# Patient Record
Sex: Male | Born: 1955 | Race: White | Hispanic: No | Smoking: Current every day smoker
Health system: Southern US, Community
[De-identification: ages and names within clinical notes are randomized; demographics above are authoritative.]

## PROBLEM LIST (undated history)

## (undated) DIAGNOSIS — J42 Unspecified chronic bronchitis: Secondary | ICD-10-CM

## (undated) DIAGNOSIS — B1920 Unspecified viral hepatitis C without hepatic coma: Secondary | ICD-10-CM

## (undated) DIAGNOSIS — E119 Type 2 diabetes mellitus without complications: Secondary | ICD-10-CM

## (undated) DIAGNOSIS — F32A Depression, unspecified: Secondary | ICD-10-CM

## (undated) DIAGNOSIS — F329 Major depressive disorder, single episode, unspecified: Secondary | ICD-10-CM

## (undated) DIAGNOSIS — F1020 Alcohol dependence, uncomplicated: Secondary | ICD-10-CM

## (undated) DIAGNOSIS — K7030 Alcoholic cirrhosis of liver without ascites: Secondary | ICD-10-CM

## (undated) DIAGNOSIS — IMO0001 Reserved for inherently not codable concepts without codable children: Secondary | ICD-10-CM

## (undated) DIAGNOSIS — C801 Malignant (primary) neoplasm, unspecified: Secondary | ICD-10-CM

## (undated) DIAGNOSIS — Z720 Tobacco use: Secondary | ICD-10-CM

## (undated) DIAGNOSIS — D696 Thrombocytopenia, unspecified: Secondary | ICD-10-CM

## (undated) DIAGNOSIS — R739 Hyperglycemia, unspecified: Secondary | ICD-10-CM

## (undated) DIAGNOSIS — F419 Anxiety disorder, unspecified: Secondary | ICD-10-CM

## (undated) DIAGNOSIS — F1910 Other psychoactive substance abuse, uncomplicated: Secondary | ICD-10-CM

## (undated) DIAGNOSIS — E871 Hypo-osmolality and hyponatremia: Secondary | ICD-10-CM

## (undated) DIAGNOSIS — G629 Polyneuropathy, unspecified: Secondary | ICD-10-CM

## (undated) HISTORY — DX: Unspecified chronic bronchitis: J42

## (undated) HISTORY — DX: Other psychoactive substance abuse, uncomplicated: F19.10

## (undated) HISTORY — PX: FRACTURE SURGERY: SHX138

---

## 1998-12-05 ENCOUNTER — Emergency Department (HOSPITAL_COMMUNITY): Admission: EM | Admit: 1998-12-05 | Discharge: 1998-12-05 | Payer: Self-pay | Admitting: Emergency Medicine

## 2001-01-15 ENCOUNTER — Emergency Department (HOSPITAL_COMMUNITY): Admission: EM | Admit: 2001-01-15 | Discharge: 2001-01-15 | Payer: Self-pay | Admitting: Emergency Medicine

## 2005-09-03 ENCOUNTER — Emergency Department (HOSPITAL_COMMUNITY): Admission: EM | Admit: 2005-09-03 | Discharge: 2005-09-03 | Payer: Self-pay | Admitting: Emergency Medicine

## 2005-09-06 ENCOUNTER — Emergency Department (HOSPITAL_COMMUNITY): Admission: EM | Admit: 2005-09-06 | Discharge: 2005-09-06 | Payer: Self-pay | Admitting: Emergency Medicine

## 2005-09-24 ENCOUNTER — Emergency Department (HOSPITAL_COMMUNITY): Admission: EM | Admit: 2005-09-24 | Discharge: 2005-09-25 | Payer: Self-pay | Admitting: Emergency Medicine

## 2005-12-25 ENCOUNTER — Emergency Department (HOSPITAL_COMMUNITY): Admission: EM | Admit: 2005-12-25 | Discharge: 2005-12-26 | Payer: Self-pay | Admitting: Emergency Medicine

## 2006-05-10 HISTORY — PX: OTHER SURGICAL HISTORY: SHX169

## 2007-06-22 ENCOUNTER — Emergency Department (HOSPITAL_COMMUNITY): Admission: EM | Admit: 2007-06-22 | Discharge: 2007-06-22 | Payer: Self-pay | Admitting: Emergency Medicine

## 2007-07-11 ENCOUNTER — Emergency Department (HOSPITAL_COMMUNITY): Admission: EM | Admit: 2007-07-11 | Discharge: 2007-07-12 | Payer: Self-pay | Admitting: Emergency Medicine

## 2007-07-13 ENCOUNTER — Emergency Department: Payer: Self-pay | Admitting: Emergency Medicine

## 2007-07-17 ENCOUNTER — Emergency Department (HOSPITAL_COMMUNITY): Admission: EM | Admit: 2007-07-17 | Discharge: 2007-07-17 | Payer: Self-pay | Admitting: Emergency Medicine

## 2007-08-07 ENCOUNTER — Emergency Department (HOSPITAL_COMMUNITY): Admission: EM | Admit: 2007-08-07 | Discharge: 2007-08-07 | Payer: Self-pay | Admitting: Emergency Medicine

## 2007-08-08 ENCOUNTER — Emergency Department (HOSPITAL_COMMUNITY): Admission: EM | Admit: 2007-08-08 | Discharge: 2007-08-08 | Payer: Self-pay | Admitting: Emergency Medicine

## 2007-08-08 ENCOUNTER — Inpatient Hospital Stay (HOSPITAL_COMMUNITY): Admission: RE | Admit: 2007-08-08 | Discharge: 2007-08-14 | Payer: Self-pay | Admitting: Psychiatry

## 2007-08-08 ENCOUNTER — Ambulatory Visit: Payer: Self-pay | Admitting: Psychiatry

## 2007-08-10 ENCOUNTER — Ambulatory Visit (HOSPITAL_COMMUNITY): Admission: RE | Admit: 2007-08-10 | Discharge: 2007-08-10 | Payer: Self-pay | Admitting: Psychiatry

## 2007-09-10 ENCOUNTER — Other Ambulatory Visit: Payer: Self-pay | Admitting: Emergency Medicine

## 2007-09-11 ENCOUNTER — Other Ambulatory Visit: Payer: Self-pay | Admitting: Emergency Medicine

## 2007-09-11 ENCOUNTER — Inpatient Hospital Stay (HOSPITAL_COMMUNITY): Admission: AD | Admit: 2007-09-11 | Discharge: 2007-09-13 | Payer: Self-pay | Admitting: Internal Medicine

## 2007-09-12 ENCOUNTER — Ambulatory Visit: Payer: Self-pay | Admitting: Psychiatry

## 2007-09-15 ENCOUNTER — Emergency Department (HOSPITAL_COMMUNITY): Admission: EM | Admit: 2007-09-15 | Discharge: 2007-09-16 | Payer: Self-pay | Admitting: Emergency Medicine

## 2007-09-22 ENCOUNTER — Emergency Department (HOSPITAL_COMMUNITY): Admission: EM | Admit: 2007-09-22 | Discharge: 2007-09-22 | Payer: Self-pay | Admitting: Emergency Medicine

## 2007-09-23 ENCOUNTER — Ambulatory Visit: Payer: Self-pay | Admitting: *Deleted

## 2007-09-23 ENCOUNTER — Inpatient Hospital Stay (HOSPITAL_COMMUNITY): Admission: EM | Admit: 2007-09-23 | Discharge: 2007-09-27 | Payer: Self-pay | Admitting: *Deleted

## 2007-11-02 ENCOUNTER — Emergency Department (HOSPITAL_COMMUNITY): Admission: EM | Admit: 2007-11-02 | Discharge: 2007-11-03 | Payer: Self-pay | Admitting: Emergency Medicine

## 2007-11-06 ENCOUNTER — Emergency Department (HOSPITAL_COMMUNITY): Admission: EM | Admit: 2007-11-06 | Discharge: 2007-11-07 | Payer: Self-pay | Admitting: Emergency Medicine

## 2008-03-20 ENCOUNTER — Emergency Department (HOSPITAL_COMMUNITY): Admission: EM | Admit: 2008-03-20 | Discharge: 2008-03-21 | Payer: Self-pay | Admitting: Emergency Medicine

## 2008-03-21 ENCOUNTER — Emergency Department: Payer: Self-pay | Admitting: Emergency Medicine

## 2008-03-23 ENCOUNTER — Emergency Department: Payer: Self-pay | Admitting: Emergency Medicine

## 2008-03-27 ENCOUNTER — Emergency Department (HOSPITAL_COMMUNITY): Admission: EM | Admit: 2008-03-27 | Discharge: 2008-03-28 | Payer: Self-pay | Admitting: Emergency Medicine

## 2008-03-28 ENCOUNTER — Emergency Department (HOSPITAL_COMMUNITY): Admission: EM | Admit: 2008-03-28 | Discharge: 2008-03-28 | Payer: Self-pay | Admitting: *Deleted

## 2008-03-29 ENCOUNTER — Emergency Department (HOSPITAL_COMMUNITY): Admission: EM | Admit: 2008-03-29 | Discharge: 2008-03-30 | Payer: Self-pay | Admitting: Emergency Medicine

## 2008-03-31 ENCOUNTER — Emergency Department (HOSPITAL_COMMUNITY): Admission: EM | Admit: 2008-03-31 | Discharge: 2008-03-31 | Payer: Self-pay | Admitting: Emergency Medicine

## 2008-04-01 ENCOUNTER — Emergency Department (HOSPITAL_COMMUNITY): Admission: EM | Admit: 2008-04-01 | Discharge: 2008-04-02 | Payer: Self-pay | Admitting: Emergency Medicine

## 2008-11-24 ENCOUNTER — Emergency Department (HOSPITAL_COMMUNITY): Admission: EM | Admit: 2008-11-24 | Discharge: 2008-11-25 | Payer: Self-pay | Admitting: Emergency Medicine

## 2008-11-25 ENCOUNTER — Emergency Department (HOSPITAL_COMMUNITY): Admission: EM | Admit: 2008-11-25 | Discharge: 2008-11-26 | Payer: Self-pay | Admitting: Emergency Medicine

## 2008-11-26 ENCOUNTER — Inpatient Hospital Stay (HOSPITAL_COMMUNITY): Admission: EM | Admit: 2008-11-26 | Discharge: 2008-12-02 | Payer: Self-pay | Admitting: Psychiatry

## 2008-11-26 ENCOUNTER — Ambulatory Visit: Payer: Self-pay | Admitting: Psychiatry

## 2009-04-27 ENCOUNTER — Emergency Department (HOSPITAL_COMMUNITY): Admission: EM | Admit: 2009-04-27 | Discharge: 2009-04-28 | Payer: Self-pay | Admitting: Emergency Medicine

## 2009-05-04 ENCOUNTER — Emergency Department (HOSPITAL_COMMUNITY): Admission: EM | Admit: 2009-05-04 | Discharge: 2009-05-04 | Payer: Self-pay | Admitting: Emergency Medicine

## 2009-05-17 ENCOUNTER — Emergency Department (HOSPITAL_COMMUNITY): Admission: EM | Admit: 2009-05-17 | Discharge: 2009-05-18 | Payer: Self-pay | Admitting: Emergency Medicine

## 2010-04-12 ENCOUNTER — Emergency Department (HOSPITAL_COMMUNITY)
Admission: EM | Admit: 2010-04-12 | Discharge: 2010-04-12 | Disposition: A | Discharge status: Other Acute Inpatient Hospital | Payer: Self-pay | Source: Home / Self Care | Admitting: Emergency Medicine

## 2010-07-18 ENCOUNTER — Emergency Department (HOSPITAL_COMMUNITY)
Admission: EM | Admit: 2010-07-18 | Discharge: 2010-07-19 | Disposition: A | Discharge status: Home / Self Care | Payer: Self-pay | Attending: Emergency Medicine | Admitting: Emergency Medicine

## 2010-07-18 ENCOUNTER — Emergency Department (HOSPITAL_COMMUNITY): Payer: Self-pay

## 2010-07-18 DIAGNOSIS — E119 Type 2 diabetes mellitus without complications: Secondary | ICD-10-CM | POA: Insufficient documentation

## 2010-07-18 DIAGNOSIS — F1010 Alcohol abuse, uncomplicated: Secondary | ICD-10-CM | POA: Insufficient documentation

## 2010-07-18 DIAGNOSIS — Z8619 Personal history of other infectious and parasitic diseases: Secondary | ICD-10-CM | POA: Insufficient documentation

## 2010-07-18 LAB — DIFFERENTIAL
Basophils Absolute: 0.1 10*3/uL (ref 0.0–0.1)
Basophils Relative: 1 % (ref 0–1)
Eosinophils Absolute: 0.1 K/uL (ref 0.0–0.7)
Eosinophils Relative: 1 % (ref 0–5)
Lymphocytes Relative: 40 % (ref 12–46)
Lymphs Abs: 2.4 10*3/uL (ref 0.7–4.0)
Monocytes Absolute: 0.6 K/uL (ref 0.1–1.0)
Monocytes Relative: 10 % (ref 3–12)
Neutro Abs: 2.9 K/uL (ref 1.7–7.7)
Neutrophils Relative %: 48 % (ref 43–77)

## 2010-07-18 LAB — COMPREHENSIVE METABOLIC PANEL WITH GFR
Albumin: 4 g/dL (ref 3.5–5.2)
CO2: 30 meq/L (ref 19–32)
Chloride: 90 meq/L — ABNORMAL LOW (ref 96–112)
Creatinine, Ser: 0.69 mg/dL (ref 0.4–1.5)
GFR calc Af Amer: >60 mL/min (ref >60)
Potassium: 3.9 meq/L (ref 3.5–5.1)
Sodium: 130 meq/L — ABNORMAL LOW (ref 135–145)
Total Bilirubin: 0.7 mg/dL (ref 0.3–1.2)
Total Protein: 8.4 g/dL — ABNORMAL HIGH (ref 6.0–8.3)

## 2010-07-18 LAB — CBC
HCT: 43.8 % (ref 39.0–52.0)
Hemoglobin: 15.7 g/dL (ref 13.0–17.0)
MCH: 36.6 pg — ABNORMAL HIGH (ref 26.0–34.0)
MCHC: 35.8 g/dL (ref 30.0–36.0)
MCV: 102.1 fL — ABNORMAL HIGH (ref 78.0–100.0)
Platelets: 70 10*3/uL — ABNORMAL LOW (ref 150–400)
RBC: 4.29 MIL/uL (ref 4.22–5.81)
RDW: 13.3 % (ref 11.5–15.5)
WBC: 5.9 10*3/uL (ref 4.0–10.5)

## 2010-07-18 LAB — RAPID URINE DRUG SCREEN, HOSP PERFORMED
Amphetamines: NOT DETECTED
Barbiturates: NOT DETECTED
Benzodiazepines: NOT DETECTED
Cocaine: NOT DETECTED
Opiates: NOT DETECTED
Tetrahydrocannabinol: NOT DETECTED

## 2010-07-18 LAB — COMPREHENSIVE METABOLIC PANEL
ALT: 68 U/L — ABNORMAL HIGH (ref 0–53)
AST: 79 U/L — ABNORMAL HIGH (ref 0–37)
Alkaline Phosphatase: 304 U/L — ABNORMAL HIGH (ref 39–117)
BUN: 7 mg/dL (ref 6–23)
Calcium: 9.2 mg/dL (ref 8.4–10.5)
GFR calc non Af Amer: >60 mL/min (ref >60)
Glucose, Bld: 521 mg/dL — ABNORMAL HIGH (ref 70–99)

## 2010-07-18 LAB — ETHANOL: Alcohol, Ethyl (B): 326 mg/dL — ABNORMAL HIGH (ref 0–10)

## 2010-07-19 LAB — GLUCOSE, CAPILLARY
Glucose-Capillary: 311 mg/dL — ABNORMAL HIGH (ref 70–99)
Glucose-Capillary: 272 mg/dL — ABNORMAL HIGH (ref 70–99)

## 2010-07-20 LAB — URINE MICROSCOPIC-ADD ON

## 2010-07-20 LAB — GLUCOSE, CAPILLARY
Glucose-Capillary: 178 mg/dL — ABNORMAL HIGH (ref 70–99)
Glucose-Capillary: 340 mg/dL — ABNORMAL HIGH (ref 70–99)
Glucose-Capillary: 366 mg/dL — ABNORMAL HIGH (ref 70–99)

## 2010-07-20 LAB — DIFFERENTIAL
Basophils Relative: 0 % (ref 0–1)
Eosinophils Absolute: 0.1 10*3/uL (ref 0.0–0.7)
Neutro Abs: 1.8 10*3/uL (ref 1.7–7.7)
Neutrophils Relative %: 38 % — ABNORMAL LOW (ref 43–77)

## 2010-07-20 LAB — URINALYSIS, ROUTINE W REFLEX MICROSCOPIC
Ketones, ur: NEGATIVE mg/dL
Leukocytes, UA: NEGATIVE
Nitrite: NEGATIVE
Specific Gravity, Urine: 1.026 (ref 1.005–1.030)
Urobilinogen, UA: 1 mg/dL (ref 0.0–1.0)
pH: 6 (ref 5.0–8.0)

## 2010-07-20 LAB — COMPREHENSIVE METABOLIC PANEL
Albumin: 3.8 g/dL (ref 3.5–5.2)
Calcium: 8.8 mg/dL (ref 8.4–10.5)
Chloride: 95 mEq/L — ABNORMAL LOW (ref 96–112)
Creatinine, Ser: 0.62 mg/dL (ref 0.4–1.5)
GFR calc non Af Amer: >60 mL/min (ref >60)
Potassium: 3.8 mEq/L (ref 3.5–5.1)
Sodium: 131 mEq/L — ABNORMAL LOW (ref 135–145)
Total Protein: 7.6 g/dL (ref 6.0–8.3)

## 2010-07-20 LAB — CBC
MCHC: 36.9 g/dL — ABNORMAL HIGH (ref 30.0–36.0)
MCV: 97.9 fL (ref 78.0–100.0)
WBC: 4.8 10*3/uL (ref 4.0–10.5)

## 2010-07-20 LAB — RAPID URINE DRUG SCREEN, HOSP PERFORMED: Cocaine: NOT DETECTED

## 2010-07-20 LAB — LIPASE, BLOOD: Lipase: 48 U/L (ref 11–59)

## 2010-07-25 LAB — DIFFERENTIAL
Basophils Absolute: 0 10*3/uL (ref 0.0–0.1)
Lymphocytes Relative: 55 % — ABNORMAL HIGH (ref 12–46)
Monocytes Absolute: 0.7 10*3/uL (ref 0.1–1.0)
Monocytes Relative: 14 % — ABNORMAL HIGH (ref 3–12)
Neutro Abs: 1.5 10*3/uL — ABNORMAL LOW (ref 1.7–7.7)

## 2010-07-25 LAB — BASIC METABOLIC PANEL
Calcium: 9.1 mg/dL (ref 8.4–10.5)
GFR calc Af Amer: >60 mL/min (ref >60)
GFR calc non Af Amer: >60 mL/min (ref >60)
Sodium: 132 mEq/L — ABNORMAL LOW (ref 135–145)

## 2010-07-25 LAB — GLUCOSE, CAPILLARY
Glucose-Capillary: 240 mg/dL — ABNORMAL HIGH (ref 70–99)
Glucose-Capillary: 580 mg/dL (ref 70–99)

## 2010-07-25 LAB — CBC
Hemoglobin: 14.5 g/dL (ref 13.0–17.0)
RBC: 3.98 MIL/uL — ABNORMAL LOW (ref 4.22–5.81)
RDW: 14.6 % (ref 11.5–15.5)
WBC: 5 10*3/uL (ref 4.0–10.5)

## 2010-07-25 LAB — ETHANOL: Alcohol, Ethyl (B): 277 mg/dL — ABNORMAL HIGH (ref 0–10)

## 2010-07-25 LAB — RAPID URINE DRUG SCREEN, HOSP PERFORMED
Benzodiazepines: NOT DETECTED
Cocaine: NOT DETECTED

## 2010-08-16 LAB — GLUCOSE, CAPILLARY
Glucose-Capillary: 192 mg/dL — ABNORMAL HIGH (ref 70–99)
Glucose-Capillary: 198 mg/dL — ABNORMAL HIGH (ref 70–99)
Glucose-Capillary: 204 mg/dL — ABNORMAL HIGH (ref 70–99)
Glucose-Capillary: 206 mg/dL — ABNORMAL HIGH (ref 70–99)
Glucose-Capillary: 219 mg/dL — ABNORMAL HIGH (ref 70–99)
Glucose-Capillary: 223 mg/dL — ABNORMAL HIGH (ref 70–99)
Glucose-Capillary: 242 mg/dL — ABNORMAL HIGH (ref 70–99)
Glucose-Capillary: 247 mg/dL — ABNORMAL HIGH (ref 70–99)
Glucose-Capillary: 292 mg/dL — ABNORMAL HIGH (ref 70–99)
Glucose-Capillary: 300 mg/dL — ABNORMAL HIGH (ref 70–99)

## 2010-08-16 LAB — RAPID URINE DRUG SCREEN, HOSP PERFORMED
Benzodiazepines: NOT DETECTED
Tetrahydrocannabinol: NOT DETECTED

## 2010-08-16 LAB — COMPREHENSIVE METABOLIC PANEL
ALT: 26 U/L (ref 0–53)
AST: 33 U/L (ref 0–37)
Albumin: 2.8 g/dL — ABNORMAL LOW (ref 3.5–5.2)
BUN: 13 mg/dL (ref 6–23)
CO2: 24 mEq/L (ref 19–32)
CO2: 32 mEq/L (ref 19–32)
Calcium: 10 mg/dL (ref 8.4–10.5)
Calcium: 8.6 mg/dL (ref 8.4–10.5)
Creatinine, Ser: 0.57 mg/dL (ref 0.4–1.5)
Creatinine, Ser: 0.64 mg/dL (ref 0.4–1.5)
GFR calc Af Amer: >60 mL/min (ref >60)
GFR calc non Af Amer: >60 mL/min (ref >60)
GFR calc non Af Amer: >60 mL/min (ref >60)
Glucose, Bld: 376 mg/dL — ABNORMAL HIGH (ref 70–99)
Sodium: 133 mEq/L — ABNORMAL LOW (ref 135–145)

## 2010-08-16 LAB — URINALYSIS, ROUTINE W REFLEX MICROSCOPIC
Ketones, ur: NEGATIVE mg/dL
Leukocytes, UA: NEGATIVE
Nitrite: NEGATIVE
Specific Gravity, Urine: 1.015 (ref 1.005–1.030)
Urobilinogen, UA: 1 mg/dL (ref 0.0–1.0)
pH: 6.5 (ref 5.0–8.0)

## 2010-08-16 LAB — TSH: TSH: 1.812 u[IU]/mL (ref 0.350–4.500)

## 2010-08-16 LAB — CBC
Hemoglobin: 12.4 g/dL — ABNORMAL LOW (ref 13.0–17.0)
MCHC: 34.7 g/dL (ref 30.0–36.0)
MCV: 107.1 fL — ABNORMAL HIGH (ref 78.0–100.0)
RBC: 3.34 MIL/uL — ABNORMAL LOW (ref 4.22–5.81)

## 2010-08-16 LAB — DIFFERENTIAL
Eosinophils Relative: 1 % (ref 0–5)
Monocytes Relative: 17 % — ABNORMAL HIGH (ref 3–12)
Neutrophils Relative %: 55 % (ref 43–77)

## 2010-08-16 LAB — URINE MICROSCOPIC-ADD ON

## 2010-08-16 LAB — RPR: RPR Ser Ql: NONREACTIVE

## 2010-08-26 ENCOUNTER — Inpatient Hospital Stay (INDEPENDENT_AMBULATORY_CARE_PROVIDER_SITE_OTHER)
Admission: RE | Admit: 2010-08-26 | Discharge: 2010-08-26 | Disposition: A | Discharge status: Home / Self Care | Payer: Self-pay | Source: Ambulatory Visit

## 2010-08-26 DIAGNOSIS — R6889 Other general symptoms and signs: Secondary | ICD-10-CM

## 2010-09-10 ENCOUNTER — Emergency Department (HOSPITAL_COMMUNITY)
Admission: EM | Admit: 2010-09-10 | Discharge: 2010-09-11 | Disposition: A | Discharge status: Home / Self Care | Payer: Self-pay | Attending: Emergency Medicine | Admitting: Emergency Medicine

## 2010-09-10 DIAGNOSIS — Z046 Encounter for general psychiatric examination, requested by authority: Secondary | ICD-10-CM | POA: Insufficient documentation

## 2010-09-10 DIAGNOSIS — F1010 Alcohol abuse, uncomplicated: Secondary | ICD-10-CM | POA: Insufficient documentation

## 2010-09-10 DIAGNOSIS — E119 Type 2 diabetes mellitus without complications: Secondary | ICD-10-CM | POA: Insufficient documentation

## 2010-09-10 DIAGNOSIS — Z8619 Personal history of other infectious and parasitic diseases: Secondary | ICD-10-CM | POA: Insufficient documentation

## 2010-09-10 LAB — URINALYSIS, ROUTINE W REFLEX MICROSCOPIC
Ketones, ur: NEGATIVE mg/dL
Leukocytes, UA: NEGATIVE
Nitrite: NEGATIVE
Urobilinogen, UA: 1 mg/dL (ref 0.0–1.0)
pH: 6.5 (ref 5.0–8.0)

## 2010-09-10 LAB — RAPID URINE DRUG SCREEN, HOSP PERFORMED
Amphetamines: NOT DETECTED
Opiates: NOT DETECTED
Tetrahydrocannabinol: NOT DETECTED

## 2010-09-10 LAB — COMPREHENSIVE METABOLIC PANEL
ALT: 33 U/L (ref 0–53)
AST: 61 U/L — ABNORMAL HIGH (ref 0–37)
Albumin: 3.4 g/dL — ABNORMAL LOW (ref 3.5–5.2)
Alkaline Phosphatase: 196 U/L — ABNORMAL HIGH (ref 39–117)
BUN: 7 mg/dL (ref 6–23)
Chloride: 94 mEq/L — ABNORMAL LOW (ref 96–112)
GFR calc Af Amer: >60 mL/min (ref >60)
Potassium: 4.2 mEq/L (ref 3.5–5.1)
Sodium: 130 mEq/L — ABNORMAL LOW (ref 135–145)
Total Bilirubin: 0.9 mg/dL (ref 0.3–1.2)
Total Protein: 8 g/dL (ref 6.0–8.3)

## 2010-09-10 LAB — CBC
MCH: 37.3 pg — ABNORMAL HIGH (ref 26.0–34.0)
Platelets: 76 10*3/uL — ABNORMAL LOW (ref 150–400)
RBC: 3.97 MIL/uL — ABNORMAL LOW (ref 4.22–5.81)
RDW: 14.9 % (ref 11.5–15.5)
WBC: 4.8 10*3/uL (ref 4.0–10.5)

## 2010-09-10 LAB — DIFFERENTIAL
Basophils Relative: 2 % — ABNORMAL HIGH (ref 0–1)
Eosinophils Absolute: 0.1 10*3/uL (ref 0.0–0.7)
Eosinophils Relative: 2 % (ref 0–5)
Monocytes Relative: 11 % (ref 3–12)
Neutrophils Relative %: 45 % (ref 43–77)

## 2010-09-11 LAB — POCT I-STAT, CHEM 8
BUN: 6 mg/dL (ref 6–23)
Calcium, Ion: 1.02 mmol/L — ABNORMAL LOW (ref 1.12–1.32)
Chloride: 97 mEq/L (ref 96–112)
Creatinine, Ser: 0.5 mg/dL (ref 0.4–1.5)
Glucose, Bld: 73 mg/dL (ref 70–99)
TCO2: 28 mmol/L (ref 0–100)
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
## 2010-09-22 NOTE — H&P (Signed)
[name], [name]             ACCOUNT NO.:  [id number]   MEDICAL RECORD NO.:  [id number]          PATIENT TYPE:  IPS   LOCATION:  0306                          FACILITY:  [facility]   PHYSICIAN:  Stark Jock, M.D. DATE OF BIRTH:  1956-01-10   DATE OF ADMISSION:  09/23/2007  DATE OF DISCHARGE:                       PSYCHIATRIC ADMISSION ASSESSMENT   IDENTIFYING INFORMATION:  A 55 year old male involuntarily admitted on  Sep 23, 2007.   HISTORY OF PRESENT ILLNESS:  The patient presented to the emergency room  with report that he was having a difficult time, has been drinking,  drinking up to 18 beers a day.  He is homeless.  He was trying to detox  himself but states he cannot do that lying behind a dumpster.  He  reports significant stressors, again of being homeless, unemployed.  No  financial income.  Wanted help because he states he was going to end up  dying.  He did go to [place] for some rehab and was very unhappy with  the facility.  States all they did was take his money and used him as  slaves.  He states that he was experiencing DTs yesterday and is  insistent on being put back on his Ativan as he felt like it worked for  his withdrawal symptoms.   PAST PSYCHIATRIC HISTORY:  Second admission to [facility].  The patient was here in April of 2009 where he was also detoxed from  alcohol.  He was homeless at that time as well.  He states he has been  detoxed at least 10 times in the past.   SOCIAL HISTORY:  This is a 55 year old male, divorced, has talked about  having a fiance, currently living in [place].  Considers himself  homeless.  In the past he has done a little remodeling for homes, again  now is unemployed.   FAMILY HISTORY:  None.   ALCOHOL AND DRUG HISTORY:  The patient smokes.  Reports a history of  DTs.  Denies any seizure activity.   PRIMARY CARE Luchiano Viscomi:  Is none.   MEDICAL PROBLEMS:  He is a non-insulin diabetic, is on metformin.   Has  not been compliant with medications.   MEDICATIONS:  Metformin 1000 mg b.i.d.   DRUG ALLERGIES:  SULFA.   PHYSICAL EXAM:  GENERAL:  This is a middle-aged male somewhat  disheveled, does not have any noted tremors, is reporting sweating.  States he has to go back and change his sheets.  He again was assessed  in the emergency room.  He received Ativan, metformin and 8 units of  NovoLog insulin for a blood sugar of 241.  VITAL SIGNS:  Temperature is 98.2, 81 heart rate, 16 respirations, blood  pressure 136/80, 173-1/2 pounds, 6 feet tall.  Alcohol level on  admission was 398.  Urine is amber with urobilinogen of 2, glucose of  220.  TSH is 3.425, MCV is 101.3, platelet count of 59.  Urine drug  screen is negative and lipase was 119 from a hospitalization on May 4  for elevated blood sugars.  MENTAL STATUS:  He is fully  alert, cooperative, fair eye contact,  Again, somewhat disheveled but appropriately dressed.  Speech is clear.  He was somewhat loud at times, was somewhat loud in the beginning of the  interview and then the patient calmed down and reported his  circumstances and stressors.  The patient feels mood is somewhat angry  and upset but then became more complacent as we listened and he reported  again his stressors.  Thought processes are coherent.  There is no  evidence of psychosis.  Cognitive function intact.  His memory is good.  Judgment is fair.  Insight is minimal.   AXIS I:  Alcohol dependence.  Substance induced mood disorder.  AXIS II:  Deferred.  AXIS III:  Non-insulin-dependent diabetes.  AXIS IV:  Severe with problems related to lack of support, occupation,  housing, economic issues, psychosocial problems, medical problems.  AXIS V:  Current is 40.   PLAN:  To detox the patient.  Work on relapse suspension.  Will continue  to check his CBGs.  Put the patient on a 60 gram modified carbohydrate  diet.  Will assess his support and living arrangements.  The  patient is  interested in pursuing some vocational rehab.  The patient will be  attending the [facility].  We will at this time discontinue the Librium  protocol and put the patient on Ativan as that appeared to be better for  his withdrawal symptoms with his last admission of about 6 weeks ago.  Case manager will assess looking at living arrangements and followup.  The patient is interested in a long term rehab program.  Tentative  length of stay is 4-5 days.      Redgie Grayer, N.P.      Stark Jock, M.D.  Electronically Signed    JO/MEDQ  D:  09/24/2007  T:  09/24/2007  Job:  [id number]

## 2010-09-22 NOTE — Consult Note (Signed)
NAMEJANSEL, VONSTEIN NO.:  [id number]   MEDICAL RECORD NO.:  [id number]          PATIENT TYPE:  INP   LOCATION:  1508                         FACILITY:  [facility]   PHYSICIAN:  Norm Salt, MD  DATE OF BIRTH:  12-20-1955   DATE OF CONSULTATION:  DATE OF DISCHARGE:                                 CONSULTATION   IDENTIFYING DATA/REASON FOR REFERRAL:  The patient is a 55 year old  unmarried Caucasian male who presented voluntarily to the emergency  department for alcohol detoxification.   HISTORY OF PRESENTING PROBLEMS:  The patient has a long history of  alcohol dependence.  He has had numerous attempts at detoxification,  alcohol treatment and sobriety.  Most recently, he was admitted to our  behavioral health service on August 08, 2007 at which time he underwent  alcohol detoxification.  He was discharged with a plan to go to a  residential program in another city.  He went there for a few days, but  found that it was a slave camp that he did not like and so he left it.  He returned to [facility] and began drinking again almost immediately.  This was about 3 weeks ago.  He admits to drinking 18 beers per day.  He  presented at the emergency department with a highly elevated blood  alcohol level.   Laboratory studies show markedly increased liver enzymes and red blood  cell morphology consistent with chronic alcohol dependence.  CT scan is  negative for any acute changes.   MENTAL STATUS/OBSERVATIONS:  The patient is a slender, normally-  developed adult male who appears tired, haggard, but is awake, alert and  fully oriented.  His sensorium is clear.  His thoughts and speech are  normally organized.  There is nothing to suggest any underlying  psychosis, thought disorder, confusion or delirium.  He is moderately  tremulous.   He indicates that he wants to remain sober following his detoxification.  He discusses the fact of his homelessness and that he is  unemployed.  He  states that historically, he has been able to stay sober when he has a  job and has a place to live.  He states that it is living on the streets  that is his biggest risk factor for relapsing.  His mood is appropriate  to the situation.  There is no suicidal ideation.   IMPRESSION:  The patient is a 55 year old male with chronic alcohol  dependence, who is also homeless, without any means of support.   DIAGNOSTIC IMPRESSION:  AXIS I:  Alcohol dependence, alcohol withdrawal  disorder and rule out substance-induced mood disorder.  AXIS II:  Deferred.  AXIS III:  History of diabetes mellitus.  AXIS IV:  Stressors, severe.  AXIS V:  Global assessment of functioning 45.   RECOMMENDATIONS:  At this point, the patient is here at [facility] instead of behavioral health center, because the patient's  diabetes was poorly controlled at the time he presented to the emergency  department.   I agree with his current alcohol detoxification management, but it would  probably  be best if it can be completed at the behavioral health center  once he is medically stabilized.  The behavioral health center case  management staff can assist the patient with identifying an appropriate  residential treatment situation or at the very least, a halfway house  for sober men as well as other aftercare resources.   At the point at which he is felt to be medically stable enough for  transfer to [facility], that is not needing IVs and a manageable diabetic  regimen, please contact [facility] intake/assessment at [phone] to arrange for  transfer.   Thank you for involving me in this patient's care.  Cell phone [phone].      Norm Salt, MD  Electronically Signed     SPB/MEDQ  D:  09/12/2007  T:  09/12/2007  Job:  [id number]

## 2010-09-22 NOTE — H&P (Signed)
[name], [name]             ACCOUNT NO.:  [id number]   MEDICAL RECORD NO.:  [id number]          PATIENT TYPE:  INP   LOCATION:  1508                         FACILITY:  [facility]   PHYSICIAN:  Leana Gamer, MDDATE OF BIRTH:  07/21/1955   DATE OF ADMISSION:  09/11/2007  DATE OF DISCHARGE:                              HISTORY & PHYSICAL   CHIEF COMPLAINT:  Mildly elevated blood sugars.   HISTORY OF PRESENT ILLNESS:  This is a 55 year old gentleman known  alcoholic who presents to the emergency room requesting detoxification.  The patient was evaluated by behavioral health, however, they refused to  take the patient as his blood sugars were 383 in the emergency room and  they felt that better diabetic control was required.  The patient is  homeless and does not have a primary care physician and thus we were  asked to admit the patient for blood sugar control.  The patient  essentially has no other complaints except for the fact that he has  chronic diarrhea associated with withdrawal from alcohol.  He has no  fever or chills.  He has no nausea and vomiting.  The patient states  that he has been through detox approximately 1-1/2 months ago at  behavioral health and then was sent to [facility] for further  rehabilitation.  The patient states that he left the facility  prematurely.  He also informed me that Librium does not work for his  detoxification and he needs Ativan.  I will verify this with behavioral  health where he was approximately a month prior.   PAST MEDICAL HISTORY:  Significant for:  1. Diabetes type 2.  2. Alcoholism.   PAST SURGICAL HISTORY:  No significant surgical history except for  placement of a plate in the right lower extremity secondary to a  fracture.   FAMILY HISTORY:  Significant for ALS/Lou Gehrig's disease in his father.   SOCIAL HISTORY:  The patient drinks about 18 12-ounce beers per day.  His last drink was approximately 16 hours ago.  The  patient smokes  approximately one pack per day for 35 years.   CURRENT MEDICATIONS:  None.  The patient is supposed to be on Metformin  500 mg p.o. b.i.d. and he states that he has been given prescription for  Ativan 2 mg t.i.d. to prevent him from going into withdrawal when he is  not drinking.   ALLERGIES:  SULFA.   REVIEW OF SYSTEMS:  14 systems reviewed and all systems are negative  except as noted in the HPI.   PHYSICAL EXAMINATION:  VITAL SIGNS:  Temperature 98.7, heart rate 92,  respirations 18, blood pressure 161/94, and O2 saturations are 96% on  room air.  GENERAL:  The patient has a ruddy complexion.  He appears in no acute  distress.  He is actually walking around the room quite comfortably and  he is alert and oriented x3.  HEENT:  He is normocephalic and atraumatic.  Pupils equal, round, and  reactive to light and accommodation.  Extraocular movements are intact.  Oropharynx is moist, no exudate, erythema, or lesions are  noted.  NECK:  Trachea is midline.  No masses, no thyromegaly, no JVD, no  carotid bruits.  LUNGS:  He has normal respiratory effort.  Equal excursions bilaterally.  No wheezing or rhonchi noted.  CARDIOVASCULAR:  Mild tachycardia.  Normal S1 and S2.  No murmurs, rubs,  or gallops noted.  PMI is nondisplaced.  No heaves, thrills on  palpation.  ABDOMEN:  Obese, soft, nontender, and nondistended.  No masses and no  splenomegaly, however, the patient does have hepatomegaly by  measurement.  LYMPH NODES:  He has no cervical, axillary, or inguinal lymphadenopathy  noted.  MUSCULOSKELETAL:  No warmth, swelling, or erythema around the joints.  He has full range of motion in the joints of the upper and lower  extremities.  No arthritic changes are noted grossly.  No spinal  tenderness noted.  NEUROLOGY:  The patient has no focal neurological deficits.  Cranial  nerves II-XII grossly intact.  The patient is ambulatory without  difficulty.  He is able to  move all extremities against gravity and his  strength is 5/5 in bilateral upper and lower extremities.  DTRs are 2+  bilaterally upper and lower extremity.  PSYCHIATRIC:  The patient is alert and oriented x3.  He has good insight  and cognition.  Good recent and remote recall.  The patient has no  asterixis.   Please note that the patient stated that he had an MRI approximately a  week or two ago at [facility] for a spot that was found on his  liver and is told that everything was normal.  We will request the  results of the MRI from [facility].   STUDIES IN THE EMERGENCY ROOM:  He has a white blood cell count of 5.8,  hemoglobin 14.1, hematocrit 40.3, platelet count 78.  Sodium 134,  potassium 4.1, chloride 96, bicarb 26, BUN 9, creatinine 0.70, and blood  glucose of 383.  Alkaline phosphatase 219, AST 101, ALT 66.  Alcohol  level initially 383 and now 138.   ASSESSMENT:  This is a patient who presents with:  1. Alcoholism requesting detox.  I will go ahead and start the patient      on an Ativan protocol as he states that Librium does not work for      him and he has had several detoxifications in the past.  I will      check with behavioral health to verify that this is actually true      and the patient will be continued on Ativan versus Librium      depending upon the results of speaking with behavioral health.  2. Thrombocytopenia most likely secondary to his alcoholism.  This      will be monitored.  3. Diabetes type 2.  The patient has blood sugars that are not that      markedly elevated and can be treated as an outpatient.  The patient      will be resumed on his Glucophage.  However, because the patient is      anticipating going to inpatient rehab, they are not comfortable      with dealing with blood sugars of 380, thus the patient is now here      for the control of blood sugars.  We will start the patient back on      his Glucophage, monitor his blood  sugars, and use sliding scale for      correction dose.  The patient will be placed on fluids at minimal      doses of 75.  The fluids will contain thiamine and multivitamin.      In best practice, the patient will have deep venous thrombosis      prophylaxis with sequential compression devices in light of his      thrombocytopenia, we will not use heparin or Lovenox.  In terms of      gastrointestinal prophylaxis, the patient will have Protonix daily.      Social work is consulted to see the patient for arrangements of      transfer to inpatient rehab as I anticipate the patient's blood      sugars will be controlled by tomorrow.      Leana Gamer, MD  Electronically Signed     MAM/MEDQ  D:  09/11/2007  T:  09/11/2007  Job:  [id number]

## 2010-09-22 NOTE — Discharge Summary (Signed)
[name], SCHWARZ             ACCOUNT NO.:  [id number]   MEDICAL RECORD NO.:  [id number]          PATIENT TYPE:  INP   LOCATION:  1508                         [facility]AC[facility]   PHYSICIAN:  Durwin Nora, MDDATE OF BIRTH:  1955/07/18   DATE OF ADMISSION:  09/11/2007  DATE OF DISCHARGE:  09/13/2007                               DISCHARGE SUMMARY   ADDENDUM to discharge summary previously dictated by Dr. Zigmund Daniel on Sep 12, 2007.   DISCHARGE CONDITION:  Stable.   DISCHARGE MEDICATIONS:  1. Actos 30 mg daily.  2. Prevacid 20 mg daily.  3. Accu-Cheks in the morning and in the evening with NovoLog  insulin      moderate sliding scale insulin coverage.  4. Glucophage 850 mg p.o. twice daily.  5. Ativan 1 mg p.o. b.i.d. for three more days.  6. Thiamine 100 mg daily.  7. Multivitamin 1 tablet daily.   DIET:  Regular.   ACTIVITY:  No restrictions.   DISPOSITION:  To homeless shelter.   DISCHARGE DIAGNOSES:  Alcohol induced hepatitis, homelessness, diabetes  mellitus, alcoholic dependence and alcoholic withdrawal disorder.  Rule  out substance abuse mood disorder, thrombocytopenia.   EXAMINATION:  He is a middle-aged man not in acute distress.  Temperature is 98, the pulse is 71, respiratory rate 18, blood pressure  136/85.  Pupils equal, reactive to light and accommodation.  Head is  atraumatic, normocephalic.  He is asthenic.  NECK:  Supple.  Oropharynx and nasopharynx are clear.  There is no  enlarged thyroid gland, no submandibular lymphadenopathy.  HEART:  Heart sounds 1 and 2.  Regular rate and rhythm.  No murmurs.  CHEST:  Clinically clear.  ABDOMEN:  Soft, nontender and no organomegaly.  Bowel sounds  normoactive.  He is alert and oriented to time, place and person.  Peripheral pulses.  There is no pedal edema.  Power is 5/5,  2+ all  limbs.   LABORATORY DATA:  Glucose is 239.  Hepatitis B surface antigen is  negative, but hepatitis C antibody is  positive.  Add hepatitis C  positive to the discharge diagnosis.  Hemoglobin A1C is 10.4.  WBC is  5.8.  Hematocrit  40.  Platelet count is 78 and MCV is 102. Also add macrocytosis to the  discharge diagnosis.  Note that AST is 45 and the ALT is 222.   Discharge time greater than 30 minutes.      Durwin Nora, MD  Electronically Signed     MIO/MEDQ  D:  09/13/2007  T:  09/13/2007  Job:  [id number]

## 2010-09-22 NOTE — Discharge Summary (Signed)
[name], [name]             ACCOUNT NO.:  [id number]   MEDICAL RECORD NO.:  [id number]          PATIENT TYPE:  INP   LOCATION:  1508                         [facility]ACILITY[facility]  [facility]   PHYSICIAN:  Leana Gamer, MDDATE OF BIRTH:  Sep 23, 1955   DATE OF ADMISSION:  09/11/2007  DATE OF DISCHARGE:                               DISCHARGE SUMMARY   DISCHARGE SUMMARY:  1. Alcoholism.  2. Diabetes Type 2.   DISCHARGE MEDICATIONS:  To be determined at time of discharge.   CONSULTANTS:  Norm Salt, MD, Psychiatry   PROCEDURES:  None.   DIAGNOSTIC STUDIES:  None.   PERTINENT LABORATORY AND X-RAY DATA:  Alk phos 290, AST 101, ALT 66.  Hepatitis panel is pending. The patient has a platelet count of 78.   CODE STATUS:  Full code.   ALLERGIES:  SULFA.   CHIEF COMPLAINT:  Mildly elevated blood sugars.   HISTORY OF PRESENT ILLNESS:  Please see the H&P dictated on Sep 11, 2007  for details of the HPI.  However, this is a gentleman who is a known  alcoholic who presented to the emergency room requesting detoxification.  The patient does have a history of diabetes and was noted to have blood  sugars close to 400. We were asked to admit the patient for diabetic  management prior to transfer to [facility].   HOSPITAL COURSE:  The patient was admitted and started on Glucophage 500  mg b.i.d. Blood sugars came down into the 200s.  Glucophage was titrated  up to the 850 mg b.i.d. I expect that the patient will have a decrease  in his blood sugars and that there will probably be a dose that he can  be managed on. The patient will continue on sliding scale insulin for as  long as he is hospitalized.   Alcoholism. The patient was started on Ativan protocol. He has been  doing well on the Ativan and recommendations from Dr. Charissa Bash is that he  be transferred to [facility] to complete his detoxification. The  patient's condition at this time is stable.   VITAL SIGNS: Are as  follows:  Heart rate 70, blood pressure 130/70,  respiratory rate 18, O2 saturation is 100% on room air. Temperature is  97.9.   I feel that the patient is medically stable for discharge to [facility] and he can be transferred when there is a bed available. Details  of the discharge medications will be dictated by the discharging  physician at the time of discharge.      Leana Gamer, MD  Electronically Signed     MAM/MEDQ  D:  09/12/2007  T:  09/12/2007  Job:  [id number]   cc:   Norm Salt, MD
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
## 2010-10-13 ENCOUNTER — Emergency Department (HOSPITAL_COMMUNITY)
Admission: EM | Admit: 2010-10-13 | Discharge: 2010-10-13 | Disposition: A | Discharge status: Home / Self Care | Payer: Self-pay | Attending: Emergency Medicine | Admitting: Emergency Medicine

## 2010-10-13 DIAGNOSIS — X58XXXA Exposure to other specified factors, initial encounter: Secondary | ICD-10-CM | POA: Insufficient documentation

## 2010-10-13 DIAGNOSIS — T1500XA Foreign body in cornea, unspecified eye, initial encounter: Secondary | ICD-10-CM | POA: Insufficient documentation

## 2010-10-13 DIAGNOSIS — E119 Type 2 diabetes mellitus without complications: Secondary | ICD-10-CM | POA: Insufficient documentation

## 2010-10-13 DIAGNOSIS — B1920 Unspecified viral hepatitis C without hepatic coma: Secondary | ICD-10-CM | POA: Insufficient documentation

## 2011-01-29 ENCOUNTER — Emergency Department (HOSPITAL_COMMUNITY)
Admission: EM | Admit: 2011-01-29 | Discharge: 2011-01-30 | Disposition: A | Discharge status: Other Acute Inpatient Hospital | Payer: Self-pay | Attending: Emergency Medicine | Admitting: Emergency Medicine

## 2011-01-29 DIAGNOSIS — E119 Type 2 diabetes mellitus without complications: Secondary | ICD-10-CM | POA: Insufficient documentation

## 2011-01-29 DIAGNOSIS — F1010 Alcohol abuse, uncomplicated: Secondary | ICD-10-CM | POA: Insufficient documentation

## 2011-01-29 DIAGNOSIS — B1920 Unspecified viral hepatitis C without hepatic coma: Secondary | ICD-10-CM | POA: Insufficient documentation

## 2011-01-29 LAB — BASIC METABOLIC PANEL
BUN: 7
Chloride: 105
GFR calc non Af Amer: >60
Glucose, Bld: 199 — ABNORMAL HIGH
Potassium: 4
Sodium: 138

## 2011-01-29 LAB — CBC
HCT: 34.8 — ABNORMAL LOW
Hemoglobin: 12.5 — ABNORMAL LOW
Hemoglobin: 14.7 g/dL (ref 13.0–17.0)
MCH: 34.3 pg — ABNORMAL HIGH (ref 26.0–34.0)
MCV: 98.2
Platelets: 82 — ABNORMAL LOW
RBC: 4.28 MIL/uL (ref 4.22–5.81)
RDW: 14.3
WBC: 4.9

## 2011-01-29 LAB — DIFFERENTIAL
Basophils Absolute: 0.1 10*3/uL (ref 0.0–0.1)
Eosinophils Absolute: 0.1
Eosinophils Relative: 2
Lymphocytes Relative: 45
Lymphocytes Relative: 54 % — ABNORMAL HIGH (ref 12–46)
Lymphs Abs: 2.2
Monocytes Absolute: 0.4
Monocytes Relative: 6 % (ref 3–12)
Neutro Abs: 2.1 10*3/uL (ref 1.7–7.7)

## 2011-01-29 LAB — COMPREHENSIVE METABOLIC PANEL
ALT: 175 U/L — ABNORMAL HIGH (ref 0–53)
AST: 165 U/L — ABNORMAL HIGH (ref 0–37)
Calcium: 8.8 mg/dL (ref 8.4–10.5)
GFR calc Af Amer: >60 mL/min (ref >60)
Glucose, Bld: 267 mg/dL — ABNORMAL HIGH (ref 70–99)
Sodium: 131 mEq/L — ABNORMAL LOW (ref 135–145)
Total Protein: 8 g/dL (ref 6.0–8.3)

## 2011-01-29 LAB — RAPID URINE DRUG SCREEN, HOSP PERFORMED
Amphetamines: NOT DETECTED
Amphetamines: NOT DETECTED
Barbiturates: NOT DETECTED
Benzodiazepines: NOT DETECTED
Benzodiazepines: NOT DETECTED
Tetrahydrocannabinol: NOT DETECTED

## 2011-01-29 LAB — ACETAMINOPHEN LEVEL: Acetaminophen (Tylenol), Serum: <0.1 — ABNORMAL LOW

## 2011-01-30 ENCOUNTER — Inpatient Hospital Stay (HOSPITAL_COMMUNITY)
Admission: EM | Admit: 2011-01-30 | Discharge: 2011-02-05 | DRG: 897 | Disposition: A | Discharge status: Home / Self Care | Payer: Self-pay | Source: Intra-hospital | Attending: Psychiatry | Admitting: Psychiatry

## 2011-01-30 DIAGNOSIS — K089 Disorder of teeth and supporting structures, unspecified: Secondary | ICD-10-CM

## 2011-01-30 DIAGNOSIS — Z5900 Homelessness unspecified: Secondary | ICD-10-CM

## 2011-01-30 DIAGNOSIS — E119 Type 2 diabetes mellitus without complications: Secondary | ICD-10-CM

## 2011-01-30 DIAGNOSIS — Z6379 Other stressful life events affecting family and household: Secondary | ICD-10-CM

## 2011-01-30 DIAGNOSIS — Z794 Long term (current) use of insulin: Secondary | ICD-10-CM

## 2011-01-30 DIAGNOSIS — B1920 Unspecified viral hepatitis C without hepatic coma: Secondary | ICD-10-CM

## 2011-01-30 DIAGNOSIS — F1020 Alcohol dependence, uncomplicated: Principal | ICD-10-CM

## 2011-01-30 LAB — GLUCOSE, CAPILLARY
Glucose-Capillary: 243 mg/dL — ABNORMAL HIGH (ref 70–99)
Glucose-Capillary: 249 mg/dL — ABNORMAL HIGH (ref 70–99)
Glucose-Capillary: 272 mg/dL — ABNORMAL HIGH (ref 70–99)
Glucose-Capillary: 354 mg/dL — ABNORMAL HIGH (ref 70–99)

## 2011-01-31 DIAGNOSIS — F1020 Alcohol dependence, uncomplicated: Secondary | ICD-10-CM

## 2011-01-31 LAB — GLUCOSE, CAPILLARY
Glucose-Capillary: 289 mg/dL — ABNORMAL HIGH (ref 70–99)
Glucose-Capillary: 392 mg/dL — ABNORMAL HIGH (ref 70–99)
Glucose-Capillary: 263 mg/dL — ABNORMAL HIGH (ref 70–99)

## 2011-02-01 LAB — BASIC METABOLIC PANEL
BUN: 9
CO2: 29
GFR calc non Af Amer: >60
Glucose, Bld: 292 — ABNORMAL HIGH
Potassium: 4.2
Sodium: 134 — ABNORMAL LOW

## 2011-02-01 LAB — I-STAT 8, (EC8 V) (CONVERTED LAB)
BUN: 7
Bicarbonate: 25.7 — ABNORMAL HIGH
Chloride: 104
HCT: 48
Hemoglobin: 16.3
Operator id: 285491
Sodium: 136

## 2011-02-01 LAB — CBC
HCT: 41.2
MCHC: 34.8
MCV: 102 — ABNORMAL HIGH
Platelets: 74 — ABNORMAL LOW
RDW: 16.4 — ABNORMAL HIGH

## 2011-02-01 LAB — POCT I-STAT, CHEM 8
BUN: 7
Calcium, Ion: 1.1 — ABNORMAL LOW
Chloride: 101
Glucose, Bld: 292 — ABNORMAL HIGH

## 2011-02-01 LAB — DIFFERENTIAL
Basophils Absolute: 0.1
Eosinophils Absolute: 0
Eosinophils Relative: 1
Monocytes Absolute: 0.6

## 2011-02-01 LAB — RAPID URINE DRUG SCREEN, HOSP PERFORMED
Barbiturates: NOT DETECTED
Benzodiazepines: NOT DETECTED
Cocaine: NOT DETECTED
Opiates: NOT DETECTED
Tetrahydrocannabinol: NOT DETECTED

## 2011-02-01 LAB — GLUCOSE, CAPILLARY: Glucose-Capillary: 348 mg/dL — ABNORMAL HIGH (ref 70–99)

## 2011-02-01 LAB — URIC ACID: Uric Acid, Serum: 4.9

## 2011-02-02 LAB — COMPREHENSIVE METABOLIC PANEL
BUN: 18
CO2: 29
Calcium: 9
Chloride: 92 — ABNORMAL LOW
Creatinine, Ser: 0.76
GFR calc Af Amer: >60
GFR calc non Af Amer: >60
Glucose, Bld: 360 — ABNORMAL HIGH
Total Bilirubin: 0.9

## 2011-02-02 LAB — HEPATIC FUNCTION PANEL
Albumin: 2.8 — ABNORMAL LOW
Indirect Bilirubin: 0.5
Total Bilirubin: 0.7
Total Protein: 6.9

## 2011-02-02 LAB — CBC
HCT: 41
HCT: 43.9
MCHC: 34.7
MCV: 102.1 — ABNORMAL HIGH
MCV: 103.7 — ABNORMAL HIGH
RBC: 4.02 — ABNORMAL LOW
RBC: 4.23
WBC: 9.2

## 2011-02-02 LAB — GLUCOSE, CAPILLARY
Glucose-Capillary: 270 mg/dL — ABNORMAL HIGH (ref 70–99)
Glucose-Capillary: 460 mg/dL — ABNORMAL HIGH (ref 70–99)
Glucose-Capillary: 309 mg/dL — ABNORMAL HIGH (ref 70–99)
Glucose-Capillary: 202 mg/dL — ABNORMAL HIGH (ref 70–99)

## 2011-02-02 LAB — DIFFERENTIAL
Eosinophils Absolute: 0.1
Lymphocytes Relative: 22
Lymphs Abs: 2
Monocytes Relative: 10
Neutrophils Relative %: 68

## 2011-02-02 LAB — HEMOGLOBIN A1C: Mean Plasma Glucose: 220 mg/dL — ABNORMAL HIGH (ref <117)

## 2011-02-02 NOTE — Assessment & Plan Note (Signed)
NAMEARMAND, PREAST NO.:  [id number]  MEDICAL RECORD NO.:  [id number]  LOCATION:                                 FACILITY:  PHYSICIAN:  Rudean Curt, MD       DATE OF BIRTH:  [birth year]  DATE OF ADMISSION: DATE OF DISCHARGE:                      PSYCHIATRIC ADMISSION ASSESSMENT   This is a voluntary admission to the services of Dr. Rudean Curt.  This is a 55 year old divorced white male.  He presented to the emergency department requesting detox from alcohol.  He reported having relapsed last Thursday drinking four 40-ounce beers a day for the last 3 or 4 days with his last drink in the a.m.  Later he amended that to having been sober for 5 months, living in the [place], and then he started dating someone that influenced him to begin drinking again.  He states he needs detox services so he can "get his life back together again." He reported he started drinking 20 days ago and this amount hasincreased over the past week.  PAST PSYCHIATRIC HISTORY:  He reports having been a patient at [facility] in December of 2008 and 2010.  In 2009, he was here at [facility] twice.  He also went to [facility] and [facility] in [facility].  He also went to [facility] and he does not have any recent history.  He says he went to [facility] for awhile.  SOCIAL HISTORY:  He reports some college.  He is married and divorced once.  He has 3 sons, 66, 25l, and 21.  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  FAMILY HISTORY:  He states his mother, father, and brothers are all alcoholics.  ALCOHOL HISTORY:  He has been abusing beer since he was a teenager, recently drinking 25 to 30 twelve-ounce beers a day.  PRIMARY CARE PROVIDER:  He does not have any.  PSYCHIATRIC CARE:  He does not have any.  MEDICAL PROBLEMS:  He reports having received a diagnosis of hepatitis C about 12 years ago.  He has never gotten interferon.  The diagnosis was made when he was  trying to donate plasma.  He states he was diagnosed with diabetes about 4 to 5 years ago.  MEDICATIONS:  He says he is supposed to be taking metformin 1000 mg p.o. b.i.d. but he has had no medication in months.  DRUG ALLERGIES:  Sulfa.  POSITIVE PHYSICAL FINDINGS:  He was medically cleared in the ED at [facility].  His vital signs showed that he was afebrile.  His temperature ranged from 98.2 to 99.  His pulse from 75 to 102.  Respirations were 13 to 18.  Blood pressure was 131/84 to 162/93.  His initial alcohol level was 239 and his initial glucose was 267.  He had no drugs in his urine. He does not have any hand tremor or visible withdrawal signs.  MENTAL STATUS EXAM:  He was seen in his room in bed.  He was drowsy but alert.  His appearance was casually groomed and nourished.  He was in hospital scrubs.  His speech was clear with a normal rate, rhythm, and tone.  His mood was appropriate to  the situation.  He had a normal affect.  Thought processes clear, rational, and goal oriented.  He knows he has to get detox.  Judgment and insight are fair.  Concentration and memory are superficially intact.  Intelligence is at least average.  He is not suicidal or homicidal.  He is not having any auditory or visual hallucinations.  Alcohol dependence, reports recent relapse of less than a month. AXIS II:  Deferred. AXIS III: 1. Hepatitis C by history. 2. Diabetes mellitus, untreated. AXIS IV:  Recently homeless, legal issues.  Apparently he has 3 felony counts of forgery for which he has a court date March 09, 2011. AXIS V:  60.  PLAN:  Admit for safety and stabilization.  He will be medically supported through alcohol detox with the low-dose Librium protocol. Metformin was reinstituted 500 mg p.o. b.i.d. and the need for any antidepressants will be assessed.  Case managers will work with him regarding discharge planning.  ESTIMATED LENGTH OF STAY:  Three to 5 days.     Mickie  Kerry Dory, P.A.-C.   ______________________________ Rudean Curt, MD    MD/MEDQ  D:  01/31/2011  T:  01/31/2011  Job:  [id number]  Electronically Signed by Emiliano Dyer ADAMS P.A.-C. on 02/01/2011 05:50:38 PM Electronically Signed by Rudean Curt  on 02/02/2011 08:33:00 AM

## 2011-02-03 LAB — BASIC METABOLIC PANEL
BUN: 7
CO2: 29
Calcium: 8.5
Chloride: 97
Creatinine, Ser: 0.67
GFR calc Af Amer: >60
GFR calc non Af Amer: >60
Glucose, Bld: 220 — ABNORMAL HIGH
Potassium: 3.9
Sodium: 136

## 2011-02-03 LAB — COMPREHENSIVE METABOLIC PANEL
ALT: 66 — ABNORMAL HIGH
AST: 103 — ABNORMAL HIGH
Alkaline Phosphatase: 219 — ABNORMAL HIGH
BUN: 9
BUN: 5 — ABNORMAL LOW
CO2: 26
CO2: 27
Calcium: 8.7
Chloride: 99
Creatinine, Ser: 0.62
GFR calc Af Amer: >60
GFR calc non Af Amer: >60
GFR calc non Af Amer: >60
Glucose, Bld: 383 — ABNORMAL HIGH
Glucose, Bld: 349 — ABNORMAL HIGH
Total Bilirubin: 1
Total Protein: 7.1

## 2011-02-03 LAB — URINALYSIS, ROUTINE W REFLEX MICROSCOPIC
Bilirubin Urine: NEGATIVE
Bilirubin Urine: NEGATIVE
Glucose, UA: 500 — AB
Glucose, UA: 500 — AB
Hgb urine dipstick: NEGATIVE
Hgb urine dipstick: NEGATIVE
Nitrite: NEGATIVE
Protein, ur: NEGATIVE
Specific Gravity, Urine: 1.014
Specific Gravity, Urine: 1.028
Urobilinogen, UA: 2 — ABNORMAL HIGH
pH: 7

## 2011-02-03 LAB — POCT I-STAT, CHEM 8
BUN: 8
BUN: 8
Calcium, Ion: 1.05 — ABNORMAL LOW
Calcium, Ion: 1.08 — ABNORMAL LOW
Chloride: 102
Chloride: 98
Creatinine, Ser: 0.8
Creatinine, Ser: 0.7
Glucose, Bld: 252 — ABNORMAL HIGH
Glucose, Bld: 214 — ABNORMAL HIGH
HCT: 43
HCT: 42
Hemoglobin: 14.6
Hemoglobin: 14.3
Potassium: 4.1
Potassium: 4
Sodium: 140
Sodium: 137
TCO2: 23
TCO2: 28

## 2011-02-03 LAB — RAPID URINE DRUG SCREEN, HOSP PERFORMED
Amphetamines: NOT DETECTED
Barbiturates: NOT DETECTED
Barbiturates: NOT DETECTED
Barbiturates: NOT DETECTED
Benzodiazepines: NOT DETECTED
Benzodiazepines: NOT DETECTED
Cocaine: NOT DETECTED
Cocaine: NOT DETECTED
Opiates: NOT DETECTED
Opiates: NOT DETECTED
Opiates: NOT DETECTED
Tetrahydrocannabinol: NOT DETECTED
Tetrahydrocannabinol: NOT DETECTED

## 2011-02-03 LAB — GLUCOSE, CAPILLARY
Glucose-Capillary: 251 mg/dL — ABNORMAL HIGH (ref 70–99)
Glucose-Capillary: 280 mg/dL — ABNORMAL HIGH (ref 70–99)
Glucose-Capillary: 275 mg/dL — ABNORMAL HIGH (ref 70–99)

## 2011-02-03 LAB — TSH: TSH: 3.425

## 2011-02-03 LAB — DIFFERENTIAL
Basophils Absolute: 0
Basophils Absolute: 0
Basophils Relative: 0
Basophils Relative: 1
Eosinophils Absolute: 0.1
Eosinophils Absolute: 0.1
Eosinophils Relative: 1
Lymphocytes Relative: 52 — ABNORMAL HIGH
Lymphs Abs: 3.2
Monocytes Relative: 11
Monocytes Relative: 12
Neutro Abs: 1.8
Neutro Abs: 1.9
Neutrophils Relative %: 30 — ABNORMAL LOW
Neutrophils Relative %: 30 — ABNORMAL LOW
Neutrophils Relative %: 37 — ABNORMAL LOW

## 2011-02-03 LAB — CBC
HCT: 40.3
HCT: 41.3
Hemoglobin: 14.1
Hemoglobin: 14.3
MCHC: 34.9
MCHC: 35.2
MCHC: 34.6
MCV: 101.8 — ABNORMAL HIGH
MCV: 101.3 — ABNORMAL HIGH
Platelets: 68 — ABNORMAL LOW
RBC: 3.86 — ABNORMAL LOW
RBC: 3.94 — ABNORMAL LOW
RBC: 4.08 — ABNORMAL LOW
RDW: 14.3
RDW: 14.6
WBC: 5.2

## 2011-02-03 LAB — HEPATIC FUNCTION PANEL
Albumin: 3.2 — ABNORMAL LOW
Total Bilirubin: 0.6

## 2011-02-03 LAB — KETONES, QUALITATIVE: Acetone, Bld: NEGATIVE

## 2011-02-03 LAB — LIPASE, BLOOD: Lipase: 119 — ABNORMAL HIGH

## 2011-02-03 LAB — ETHANOL
Alcohol, Ethyl (B): 147 — ABNORMAL HIGH
Alcohol, Ethyl (B): 246 — ABNORMAL HIGH
Alcohol, Ethyl (B): 398 — ABNORMAL HIGH

## 2011-02-04 LAB — GLUCOSE, CAPILLARY: Glucose-Capillary: 191 mg/dL — ABNORMAL HIGH (ref 70–99)

## 2011-02-04 LAB — RAPID URINE DRUG SCREEN, HOSP PERFORMED
Amphetamines: NOT DETECTED
Amphetamines: NOT DETECTED
Barbiturates: NOT DETECTED
Cocaine: NOT DETECTED
Opiates: NOT DETECTED
Opiates: NOT DETECTED
Tetrahydrocannabinol: NOT DETECTED

## 2011-02-04 LAB — COMPREHENSIVE METABOLIC PANEL
ALT: 54 — ABNORMAL HIGH
BUN: 4 — ABNORMAL LOW
CO2: 35 — ABNORMAL HIGH
Calcium: 9.3
Creatinine, Ser: 0.8
GFR calc non Af Amer: >60
Glucose, Bld: 354 — ABNORMAL HIGH
Sodium: 137

## 2011-02-04 LAB — URINALYSIS, ROUTINE W REFLEX MICROSCOPIC
Bilirubin Urine: NEGATIVE
Hgb urine dipstick: NEGATIVE
Specific Gravity, Urine: 1.017
pH: 6

## 2011-02-04 LAB — CBC
HCT: 41.5
Hemoglobin: 13.5
Hemoglobin: 14.4
MCHC: 34.3
MCHC: 34.6
MCV: 102.8 — ABNORMAL HIGH
Platelets: 75 — ABNORMAL LOW
RBC: 4.04 — ABNORMAL LOW

## 2011-02-04 LAB — POCT I-STAT, CHEM 8
BUN: 7
Creatinine, Ser: 0.8
Glucose, Bld: 327 — ABNORMAL HIGH
Potassium: 4.4
Sodium: 137

## 2011-02-04 LAB — DIFFERENTIAL
Basophils Relative: 1
Eosinophils Absolute: 0.1
Eosinophils Relative: 1
Lymphocytes Relative: 56 — ABNORMAL HIGH
Lymphs Abs: 2.8
Lymphs Abs: 3
Monocytes Relative: 11
Neutro Abs: 1.8
Neutrophils Relative %: 31 — ABNORMAL LOW
Neutrophils Relative %: 33 — ABNORMAL LOW

## 2011-02-04 LAB — ETHANOL: Alcohol, Ethyl (B): 133 — ABNORMAL HIGH

## 2011-02-05 LAB — GLUCOSE, CAPILLARY: Glucose-Capillary: 178 mg/dL — ABNORMAL HIGH (ref 70–99)

## 2011-02-09 LAB — POCT I-STAT, CHEM 8
Chloride: 97
Creatinine, Ser: 0.8
Glucose, Bld: 295 — ABNORMAL HIGH
HCT: 41
Potassium: 4
Sodium: 136

## 2011-02-09 LAB — RAPID URINE DRUG SCREEN, HOSP PERFORMED
Amphetamines: NOT DETECTED
Amphetamines: NOT DETECTED
Amphetamines: NOT DETECTED
Barbiturates: NOT DETECTED
Barbiturates: NOT DETECTED
Barbiturates: NOT DETECTED
Barbiturates: NOT DETECTED
Benzodiazepines: NOT DETECTED
Benzodiazepines: NOT DETECTED
Benzodiazepines: NOT DETECTED
Benzodiazepines: POSITIVE — AB
Cocaine: NOT DETECTED
Cocaine: NOT DETECTED
Cocaine: NOT DETECTED
Cocaine: NOT DETECTED
Opiates: NOT DETECTED
Opiates: NOT DETECTED
Opiates: NOT DETECTED
Tetrahydrocannabinol: NOT DETECTED
Tetrahydrocannabinol: NOT DETECTED
Tetrahydrocannabinol: NOT DETECTED

## 2011-02-09 LAB — BASIC METABOLIC PANEL
BUN: 9
BUN: 10
BUN: 10
BUN: 11
CO2: 24
CO2: 29
Calcium: 8.6
Calcium: 8.6
Calcium: 9.1
Chloride: 103
Creatinine, Ser: 0.56
Creatinine, Ser: 0.66
Creatinine, Ser: 0.69
GFR calc Af Amer: >60
GFR calc non Af Amer: >60
GFR calc non Af Amer: >60
GFR calc non Af Amer: >60
GFR calc non Af Amer: >60
Glucose, Bld: 244 — ABNORMAL HIGH
Glucose, Bld: 450 — ABNORMAL HIGH
Glucose, Bld: 387 — ABNORMAL HIGH
Glucose, Bld: 394 — ABNORMAL HIGH
Glucose, Bld: 475 — ABNORMAL HIGH
Potassium: 3.7
Potassium: 4.8
Sodium: 136
Sodium: 133 — ABNORMAL LOW

## 2011-02-09 LAB — CBC
HCT: 37.3 — ABNORMAL LOW
HCT: 39
HCT: 41.3
Hemoglobin: 13.7
Hemoglobin: 13.6
Hemoglobin: 14.3
MCHC: 34.1
MCHC: 34.6
MCHC: 34.8
MCV: 101.1 — ABNORMAL HIGH
MCV: 101.7 — ABNORMAL HIGH
MCV: 102.3 — ABNORMAL HIGH
Platelets: 60 — ABNORMAL LOW
Platelets: 77 — ABNORMAL LOW
Platelets: 80 — ABNORMAL LOW
Platelets: 83 — ABNORMAL LOW
RBC: 3.83 — ABNORMAL LOW
RBC: 4.03 — ABNORMAL LOW
RDW: 14.2
RDW: 14.2
RDW: 14.4
RDW: 14.5
RDW: 14.5
WBC: 4.7
WBC: 5.9

## 2011-02-09 LAB — DIFFERENTIAL
Basophils Absolute: 0.1
Basophils Absolute: 0
Basophils Absolute: 0
Basophils Absolute: 0.1
Basophils Relative: 1
Basophils Relative: 1
Eosinophils Absolute: 0
Eosinophils Absolute: 0.1
Eosinophils Relative: 1
Eosinophils Relative: 3
Lymphocytes Relative: 32
Lymphocytes Relative: 17
Lymphocytes Relative: 51 — ABNORMAL HIGH
Lymphs Abs: 1.9
Monocytes Absolute: 0.7
Monocytes Absolute: 0.6
Neutro Abs: 3.4
Neutro Abs: 1.9
Neutro Abs: 2.7
Neutrophils Relative %: 54
Neutrophils Relative %: 34 — ABNORMAL LOW
Neutrophils Relative %: 47
Neutrophils Relative %: 72

## 2011-02-09 LAB — URINALYSIS, ROUTINE W REFLEX MICROSCOPIC
Bilirubin Urine: NEGATIVE
Bilirubin Urine: NEGATIVE
Ketones, ur: NEGATIVE
Ketones, ur: NEGATIVE
Nitrite: NEGATIVE
Nitrite: NEGATIVE
Nitrite: NEGATIVE
Specific Gravity, Urine: 1.027
Urobilinogen, UA: 0.2
Urobilinogen, UA: 1
Urobilinogen, UA: 1

## 2011-02-09 LAB — GLUCOSE, CAPILLARY
Glucose-Capillary: 469 — ABNORMAL HIGH
Glucose-Capillary: 123 — ABNORMAL HIGH
Glucose-Capillary: 302 — ABNORMAL HIGH
Glucose-Capillary: 360 — ABNORMAL HIGH

## 2011-02-09 LAB — ETHANOL: Alcohol, Ethyl (B): 46 — ABNORMAL HIGH

## 2011-02-09 LAB — URINE MICROSCOPIC-ADD ON: Urine-Other: NONE SEEN

## 2011-02-09 NOTE — Assessment & Plan Note (Deleted)
[name], [name] NO.:  [id number]  MEDICAL RECORD NO.:  [id number]  LOCATION:  0302                          FACILITY:  [facility]  PHYSICIAN:  Rudean Curt, MD       DATE OF BIRTH:  01/07/1956  DATE OF ADMISSION:  01/30/2011 DATE OF DISCHARGE:  02/05/2011                      PSYCHIATRIC ADMISSION ASSESSMENT   IDENTIFYING INFORMATION:  This is a 55 year old, Caucasian male, single. This is a voluntary admission.  HISTORY OF PRESENT ILLNESS.:  [name] presented requesting detox from alcohol.  He had relapsed about a week previously, drinking about four 40 ounce beers a day for the previous 3 or 4 days.  He had also been drinking in binge pattern for the previous 20 days and it had actually worsened over the previous week.  Prior to that, he had been living in an [place] and had been sober for about 5 months.  He became involved in a relationship and reports that was the trigger for the relapse.  He denied any suicidal thoughts.  He has a history of several admissions at [facility] in the past.  Also has been treated at [facility] in [place] and [facility] in 2010.  He has also been treated at [facility] in [place], [place], and  also attended a treatment program in [place], [place].  He reports abusing beer since he was a teenager and can drink up to 25 to 30 12 ounce beers a day.  MEDICAL EVALUATION:  He was medically evaluated in the [facility] emergency room.  He has been diagnosed with hepatitis C about 12 years ago, but has never received medication treatment.  He has been diagnosed with type 2 diabetes 4 to 5 years ago and has been followed at [facility].  In the emergency room, vital signs shows a temperature of 98.2, pulse 75, respirations 18, blood pressure 162/93. Initial alcohol level 239 mg/dL and random glucose 267 mg/dL.  Urine drug screen was negative.  COURSE OF HOSPITALIZATION:  He was admitted to our dual  diagnosis unit and presented without any acute signs of withdrawal.  He was drowsy, but in full contact with reality, casually groomed and nourished in hospital scrubs.  Mood neutral, affect appropriate to the situation.  Thinking logical and goal oriented.  Concentration and memory are adequate. Intelligence average.  Denying any suicidal or homicidal thoughts.  He was given a provisional diagnosis of alcohol dependence and axis II was deferred.  He was started on a Librium protocol with a goal of a safe detox in 4 days and started back on metformin 500 mg b.i.d.  While on our unit, he was seen on a regular basis by the diabetes nurse consultant and his CBGs ranged between 200 and 300 for the first few days, and was started on Lantus 10 units of insulin q.h.s.  He was also provided with a controlled carbohydrate diet. At discharge, we offered  home health nursing visit to instruct in insulin administration, which  he declined since he was experienced with giving himself insulin.  His group participation was sporadic.  He voiced intent to return to an [facility] and to follow up with [facility] for his medical  issues.  We offered him placement in a long-term alcohol rehab program, but he  has a court case pending and needs to reside locally in order to resolve it.  He did complain of some tooth pain and was given Anbesol and ibuprofen with good relief, and he will follow up with [facility] for his dental pain.  Hemoglobin A1c was found to be 9.3.  Meanwhile, he did well on the detox protocol with no distress.  Consistently denied any suicidal or homicidal thoughts and was completely detoxed by September 28th.  DISCHARGE PLAN:  He will follow up at [facility] on March 12, 2011 at 10:30 a.m. and will follow up at the [facility] and attend [place] meetings.  DISCHARGE DIAGNOSES:  Axis I:  Alcohol dependence. Axis II:  Deferred. Axis III:  Hepatitis C by history, dental pain  not otherwise specified, diabetes mellitus type 2 with poor control. Axis IV:  Moderate issues with homelessness, resolving, and legal issues. Axis V:  Current 50, past year not known.  DISCHARGE CONDITION:  Improved and stable.  DISCHARGE MEDICATIONS: 1. Lantus insulin 12 units p.o. q.h.s. and he was given a Lantus     FlexPen. 2. Metformin 500 mg b.i.d. with meals. 3. Trazodone 50 mg h.s. p.r.n. insomnia.     Margaret A. Nicki Reaper, N.P.   ______________________________ Rudean Curt, MD    MAS/MEDQ  D:  02/05/2011  T:  02/05/2011  Job:  [id number]  Electronically Signed by Lanell Persons N.P. on 02/08/2011 10:09:50 AM Electronically Signed by Rudean Curt  on 02/09/2011 03:32:36 PM

## 2011-02-12 NOTE — Discharge Summary (Signed)
NAMELUCILE, [name] NO.:  [id number]  MEDICAL RECORD NO.:  [id number]  LOCATION:  0302                          FACILITY:  [facility]  PHYSICIAN:  Rudean Curt, MD       DATE OF BIRTH:  July 04, 1955  DATE OF ADMISSION:  01/30/2011 DATE OF DISCHARGE:  02/05/2011                      PSYCHIATRIC ADMISSION ASSESSMENT   IDENTIFYING INFORMATION:  This is a 55 year old, Caucasian male, single. This is a voluntary admission.  HISTORY OF PRESENT ILLNESS.:  [name] presented requesting detox from alcohol.  He had relapsed about a week previously, drinking about four 40 ounce beers a day for the previous 3 or 4 days.  He had also been drinking in binge pattern for the previous 20 days and it had actually worsened over the previous week.  Prior to that, he had been living in an [place] and had been sober for about 5 months.  He became involved in a relationship and reports that was the trigger for the relapse.  He denied any suicidal thoughts.  He has a history of several admissions at [facility] in the past.  Also has been treated at [facility] in [place] and [facility] in 2010.  He has also been treated at the [facility] program in [place], [place], and  also attended a treatment program in [place], [place].  He reports abusing beer since he was a teenager and can drink up to 25 to 30 12 ounce beers a day.  MEDICAL EVALUATION:  He was medically evaluated in the [facility] emergency room.  He has been diagnosed with hepatitis C about 12 years ago, but has never received medication treatment.  He has been diagnosed with type 2 diabetes 4 to 5 years ago and has been followed at [facility].  In the emergency room, vital signs shows a temperature of 98.2, pulse 75, respirations 18, blood pressure 162/93. Initial alcohol level 239 mg/dL and random glucose 267 mg/dL.  Urine drug screen was negative.  COURSE OF HOSPITALIZATION:  He was admitted to our dual  diagnosis unit and presented without any acute signs of withdrawal.  He was drowsy, but in full contact with reality, casually groomed and nourished in hospital scrubs.  Mood neutral, affect appropriate to the situation.  Thinking logical and goal oriented.  Concentration and memory are adequate. Intelligence average.  Denying any suicidal or homicidal thoughts.  He was given a provisional diagnosis of alcohol dependence and axis II was deferred.  He was started on a Librium protocol with a goal of a safe detox in 4 days and started back on metformin 500 mg b.i.d.  While on our unit, he was seen on a regular basis by the diabetes nurse consultant and his CBGs ranged between 200 and 300 for the first few days, and was started on Lantus 10 units of insulin q.h.s.  He was also provided with a controlled carbohydrate diet. At discharge, we offered  home health nursing visit to instruct in insulin administration, which  he declined since he was experienced with giving himself insulin.  His group participation was sporadic.  He voiced intent to return to an [facility] and to follow up with [facility] for his medical  issues.  We offered him placement in a long-term alcohol rehab program, but he  has a court case pending and needs to reside locally in order to resolve it.  He did complain of some tooth pain and was given Anbesol and ibuprofen with good relief, and he will follow up with [facility] for his dental pain.  Hemoglobin A1c was found to be 9.3.  Meanwhile, he did well on the detox protocol with no distress.  Consistently denied any suicidal or homicidal thoughts and was completely detoxed by September 28th.  DISCHARGE PLAN:  He will follow up at [facility] on March 12, 2011 at 10:30 a.m. and will follow up at the [facility] and attend [place] meetings.  DISCHARGE DIAGNOSES:  Axis I:  Alcohol dependence. Axis II:  Deferred. Axis III:  Hepatitis C by history, dental pain  not otherwise specified, diabetes mellitus type 2 with poor control. Axis IV:  Moderate issues with homelessness, resolving, and legal issues. Axis V:  Current 50, past year not known.  DISCHARGE CONDITION:  Improved and stable.  DISCHARGE MEDICATIONS: 1. Lantus insulin 12 units p.o. q.h.s. and he was given a Lantus     FlexPen. 2. Metformin 500 mg b.i.d. with meals. 3. Trazodone 50 mg h.s. p.r.n. insomnia.     Margaret A. Nicki Reaper, N.P.   ______________________________ Rudean Curt, MD    MAS/MEDQ  D:  02/05/2011  T:  02/05/2011  Job:  [id number]  Electronically Signed by Lanell Persons N.P. on 02/08/2011 10:09:50 AM Electronically Signed by Rudean Curt  on 02/09/2011 03:32:36 PM

## 2011-11-05 ENCOUNTER — Emergency Department (HOSPITAL_COMMUNITY)
Admission: EM | Admit: 2011-11-05 | Discharge: 2011-11-06 | Disposition: A | Discharge status: Other Acute Inpatient Hospital | Payer: Self-pay | Attending: Emergency Medicine | Admitting: Emergency Medicine

## 2011-11-05 ENCOUNTER — Encounter (HOSPITAL_COMMUNITY): Payer: Self-pay | Admitting: *Deleted

## 2011-11-05 DIAGNOSIS — E119 Type 2 diabetes mellitus without complications: Secondary | ICD-10-CM | POA: Insufficient documentation

## 2011-11-05 DIAGNOSIS — F10929 Alcohol use, unspecified with intoxication, unspecified: Secondary | ICD-10-CM

## 2011-11-05 DIAGNOSIS — F1010 Alcohol abuse, uncomplicated: Secondary | ICD-10-CM | POA: Insufficient documentation

## 2011-11-05 HISTORY — DX: Unspecified viral hepatitis C without hepatic coma: B19.20

## 2011-11-05 LAB — RAPID URINE DRUG SCREEN, HOSP PERFORMED
Amphetamines: NOT DETECTED
Benzodiazepines: NOT DETECTED
Opiates: NOT DETECTED

## 2011-11-05 LAB — URINALYSIS, ROUTINE W REFLEX MICROSCOPIC
Glucose, UA: NEGATIVE mg/dL
Protein, ur: NEGATIVE mg/dL
pH: 5.5 (ref 5.0–8.0)

## 2011-11-05 LAB — COMPREHENSIVE METABOLIC PANEL
Alkaline Phosphatase: 124 U/L — ABNORMAL HIGH (ref 39–117)
BUN: 7 mg/dL (ref 6–23)
CO2: 25 mEq/L (ref 19–32)
Chloride: 95 mEq/L — ABNORMAL LOW (ref 96–112)
Creatinine, Ser: 0.49 mg/dL — ABNORMAL LOW (ref 0.50–1.35)
GFR calc Af Amer: >90 mL/min (ref >90)
GFR calc non Af Amer: >90 mL/min (ref >90)
Glucose, Bld: 207 mg/dL — ABNORMAL HIGH (ref 70–99)
Potassium: 4.1 mEq/L (ref 3.5–5.1)
Total Bilirubin: 0.6 mg/dL (ref 0.3–1.2)

## 2011-11-05 LAB — GLUCOSE, CAPILLARY

## 2011-11-05 LAB — URINE MICROSCOPIC-ADD ON

## 2011-11-05 LAB — ETHANOL: Alcohol, Ethyl (B): 343 mg/dL — ABNORMAL HIGH (ref 0–11)

## 2011-11-05 LAB — CBC
HCT: 44.4 % (ref 39.0–52.0)
Hemoglobin: 16.2 g/dL (ref 13.0–17.0)
MCV: 98 fL (ref 78.0–100.0)
RDW: 13.2 % (ref 11.5–15.5)
WBC: 5.9 10*3/uL (ref 4.0–10.5)

## 2011-11-05 MED ORDER — NICOTINE 21 MG/24HR TD PT24
21.0000 mg | MEDICATED_PATCH | Freq: Every day | TRANSDERMAL | Status: DC
  Administered 2011-11-06: 21 mg via TRANSDERMAL
  Filled 2011-11-05: qty 1

## 2011-11-05 MED ORDER — LORAZEPAM 1 MG PO TABS
0.0000 mg | ORAL_TABLET | Freq: Four times a day (QID) | ORAL | Status: DC
  Administered 2011-11-05 (×2): 2 mg via ORAL
  Administered 2011-11-05: 1 mg via ORAL
  Filled 2011-11-05: qty 2
  Filled 2011-11-05 (×2): qty 1

## 2011-11-05 MED ORDER — INSULIN GLARGINE 100 UNIT/ML ~~LOC~~ SOLN
12.0000 [IU] | Freq: Every day | SUBCUTANEOUS | Status: DC
  Administered 2011-11-05: 12 [IU] via SUBCUTANEOUS
  Filled 2011-11-05: qty 1

## 2011-11-05 MED ORDER — THIAMINE HCL 100 MG/ML IJ SOLN: 100.0000 mg | Freq: Every day | INTRAMUSCULAR | Status: DC

## 2011-11-05 MED ORDER — ONDANSETRON HCL 4 MG PO TABS: 4.0000 mg | ORAL_TABLET | Freq: Three times a day (TID) | ORAL | Status: DC | PRN

## 2011-11-05 MED ORDER — ALUM & MAG HYDROXIDE-SIMETH 200-200-20 MG/5ML PO SUSP: 30.0000 mL | ORAL | Status: DC | PRN

## 2011-11-05 MED ORDER — ZOLPIDEM TARTRATE 5 MG PO TABS
5.0000 mg | ORAL_TABLET | Freq: Every evening | ORAL | Status: DC | PRN
  Administered 2011-11-05: 5 mg via ORAL
  Filled 2011-11-05: qty 1

## 2011-11-05 MED ORDER — VITAMIN B-1 100 MG PO TABS
100.0000 mg | ORAL_TABLET | Freq: Every day | ORAL | Status: DC
  Administered 2011-11-05 – 2011-11-06 (×2): 100 mg via ORAL
  Filled 2011-11-05 (×2): qty 1

## 2011-11-05 MED ORDER — LORAZEPAM 1 MG PO TABS
1.0000 mg | ORAL_TABLET | Freq: Four times a day (QID) | ORAL | Status: DC | PRN
  Filled 2011-11-05: qty 1

## 2011-11-05 MED ORDER — IBUPROFEN 200 MG PO TABS: 600.0000 mg | ORAL_TABLET | Freq: Three times a day (TID) | ORAL | Status: DC | PRN

## 2011-11-05 MED ORDER — FOLIC ACID 1 MG PO TABS
1.0000 mg | ORAL_TABLET | Freq: Every day | ORAL | Status: DC
  Administered 2011-11-05 – 2011-11-06 (×2): 1 mg via ORAL
  Filled 2011-11-05 (×2): qty 1

## 2011-11-05 MED ORDER — ADULT MULTIVITAMIN W/MINERALS CH
1.0000 | ORAL_TABLET | Freq: Every day | ORAL | Status: DC
  Administered 2011-11-05 – 2011-11-06 (×2): 1 via ORAL
  Filled 2011-11-05 (×2): qty 1

## 2011-11-05 MED ORDER — LORAZEPAM 2 MG/ML IJ SOLN: 1.0000 mg | Freq: Four times a day (QID) | INTRAMUSCULAR | Status: DC | PRN

## 2011-11-05 MED ORDER — LORAZEPAM 1 MG PO TABS
0.0000 mg | ORAL_TABLET | Freq: Two times a day (BID) | ORAL | Status: DC
Start: 2011-11-07 — End: 2011-11-06

## 2011-11-05 NOTE — ED Notes (Signed)
EMS called to [facility].  Found patient ambulating at scene. Patient states he wanted to be seen at ED for Detox. Patient denies any nausea, vomiting,  Patient has history of Hep C

## 2011-11-05 NOTE — BH Assessment (Signed)
Assessment Note   [name] is an 56 y.o. male. Pt denies SI, HI or psychosis. Requests detox from ETOH. Pt stated that he tries to detox on his own, but he got the shakes and feared he was going to have a seizure (no reported or known history of seizures) so he started drinking again. Reported drinking daily a 12 pack of beer and up to a case. Pt stated he was out of work this week and "that was all I did" was drink all day long. Reports financial issues and relationship issues due to his drinking. Pt sees connection to his medical issues and drinking and states he tries each year to detox for 5-6 months at a time to "clear my liver up". When asked why today for getting sober pt stated "I started getting sick and I couldn't take it any longer." Pt reports multiple detox IPT over time with most recent stay at [facility] in 2012. Pt commented that he really would like a prescription for "a couple of days worth of ativan, so I can detox on my own" because he has to get back to the hotel to get his stuff "or they will throw everything away." Discussed options and pt is making decision if he wishes IPT or not.  Axis I: Alcohol Dependence Axis II: Deferred Axis III:  Past Medical History  Diagnosis Date  . Hepatitis C   . Diabetes mellitus    Axis IV: economic problems, occupational problems, other psychosocial or environmental problems and problems with primary support group Axis V: 41-50 serious symptoms  Past Medical History:  Past Medical History  Diagnosis Date  . Hepatitis C   . Diabetes mellitus     History reviewed. No pertinent past surgical history.  Family History: History reviewed. No pertinent family history.  Social History:  does not have a smoking history on file. He does not have any smokeless tobacco history on file. He reports that he drinks about 9 ounces of alcohol per week. His drug history not on file.  Additional Social History:  Alcohol / Drug Use Pain Medications:  N/A Prescriptions: See PTA Listing Over the Counter: N/A History of alcohol / drug use?: Yes Longest period of sobriety (when/how long): 6 years: pt unable to recall time stated "when my kids were little" Negative Consequences of Use: Financial;Personal relationships Withdrawal Symptoms: Change in blood pressure;Fever / Chills;Patient aware of relationship between substance abuse and physical/medical complications;Tremors;Weakness Substance #1 Name of Substance 1: ETOH 1 - Age of First Use: teens 1 - Amount (size/oz): 12 pack to a case of beer daily 1 - Frequency: daily 1 - Duration: 6-7 months at this rate 1 - Last Use / Amount: 11/04/11 PM  CIWA: CIWA-Ar BP: 163/95 mmHg Pulse Rate: 91  Nausea and Vomiting: no nausea and no vomiting Tactile Disturbances: very mild itching, pins and needles, burning or numbness Tremor: two Auditory Disturbances: very mild harshness or ability to frighten Paroxysmal Sweats: barely perceptible sweating, palms moist Visual Disturbances: not present Anxiety: mildly anxious Headache, Fullness in Head: none present Agitation: normal activity Orientation and Clouding of Sensorium: oriented and can do serial additions CIWA-Ar Total: 6  COWS:    Allergies:  Allergies  Allergen Reactions  . Sulfa Antibiotics     Home Medications:  (Not in a hospital admission)  OB/GYN Status:  No LMP for male patient.  General Assessment Data Location of Assessment: [facility] Living Arrangements: Other (Comment);Alone (Staying in a hotel room) Can pt  return to current living arrangement?: Yes Admission Status: Voluntary Is patient capable of signing voluntary admission?: Yes Transfer from: [facility] Referral Source: Self/Family/Friend  Education Status Is patient currently in school?: No  Risk to self Suicidal Ideation: No Suicidal Intent: No Is patient at risk for suicide?: No Suicidal Plan?: No Access to Means: No What has been your use of  drugs/alcohol within the last 12 months?: ETOH daily Previous Attempts/Gestures: No How many times?: 0  Other Self Harm Risks: N/A Triggers for Past Attempts: None known Intentional Self Injurious Behavior: None Family Suicide History: No Recent stressful life event(s): Conflict (Comment);Other (Comment) (Argument w/ Girlfriend; health complications from drinking) Persecutory voices/beliefs?: No Depression: No Substance abuse history and/or treatment for substance abuse?: Yes Suicide prevention information given to non-admitted patients: Not applicable  Risk to Others Homicidal Ideation: No Thoughts of Harm to Others: No Current Homicidal Intent: No Current Homicidal Plan: No Access to Homicidal Means: No Identified Victim: N/A History of harm to others?: No Assessment of Violence: None Noted Violent Behavior Description: cooperative, anxious Does patient have access to weapons?: No Criminal Charges Pending?: No Does patient have a court date: No  Psychosis Hallucinations: None noted Delusions: None noted  Mental Status Report Appear/Hygiene: Disheveled Eye Contact: Good Motor Activity: Restlessness;Tremors Speech: Logical/coherent Level of Consciousness: Quiet/awake;Restless Mood: Anxious Affect: Anxious Anxiety Level: None Thought Processes: Coherent;Relevant Judgement: Unimpaired Orientation: Person;Place;Time;Situation Obsessive Compulsive Thoughts/Behaviors: None  Cognitive Functioning Concentration: Normal Memory: Recent Intact;Remote Intact IQ: Average Insight: Good Impulse Control: Fair Appetite: Fair Weight Loss: 0  Weight Gain: 0  Sleep: No Change Vegetative Symptoms: None  ADLScreening [facility] Assessment Services) Patient's cognitive ability adequate to safely complete daily activities?: Yes Patient able to express need for assistance with ADLs?: Yes Independently performs ADLs?: Yes  Abuse/Neglect [facility]) Physical Abuse: Denies Verbal Abuse:  Denies Sexual Abuse: Denies  Prior Inpatient Therapy Prior Inpatient Therapy: Yes Prior Therapy Dates: 2012 (most recent) (multiple IPTs over time) Prior Therapy Facilty/Provider(s): [facility] (2012) ([facility], [facility] in past) Reason for Treatment: SA Detox/Treatment  Prior Outpatient Therapy Prior Outpatient Therapy: No  ADL Screening (condition at time of admission) Patient's cognitive ability adequate to safely complete daily activities?: Yes Patient able to express need for assistance with ADLs?: Yes Independently performs ADLs?: Yes       Abuse/Neglect Assessment (Assessment to be complete while patient is alone) Physical Abuse: Denies Verbal Abuse: Denies Sexual Abuse: Denies Exploitation of patient/patient's resources: Denies Self-Neglect: Denies Values / Beliefs Cultural Requests During Hospitalization: None Spiritual Requests During Hospitalization: None   Advance Directives (For Healthcare) Advance Directive: Patient does not have advance directive;Patient would not like information Pre-existing out of facility DNR order (yellow form or pink MOST form): No    Additional Information 1:1 In Past 12 Months?: No CIRT Risk: No Elopement Risk: No Does patient have medical clearance?: Yes     Disposition:  Disposition Disposition of Patient: Other dispositions Other disposition(s): Other (Comment) (Pt unsure about if wanting IPT or OPT)  On Site Evaluation by:   Reviewed with Physician:     Milagros Evener 11/05/2011 6:40 PM

## 2011-11-05 NOTE — ED Provider Notes (Addendum)
Pt states he goes through detox about every 6 months. States he has drank as much as a case a day the day before he came in. Denies having SI. No tremor noted.  Waiting to have ACT evaluate and placement.    07:56 Santiago Glad, ACT will evaluate patient.   Janice Norrie, MD 11/05/11 1324  Janice Norrie, MD 11/05/11 [phone]

## 2011-11-05 NOTE — ED Provider Notes (Signed)
History     CSN: [id number]  Arrival date & time 11/05/11  6314   First MD Initiated Contact with Patient 11/05/11 [phone]      Chief Complaint  Patient presents with  . Alcohol Problem    detox   HPI  History provided by the patient. Patient is a 56 year old male with history of diabetes and hepatitis C and chronic alcohol use who presents with requests with help with alcohol detox. Patient states that he has had problems with alcoholism in the past has been off and on with alcohol for many years. Patient states that he recently started drinking heavily again over the past several months after he has a poor relationship with a ex-girlfriend. Patient reports drinking up to a case of beer a day.patient has been through inpatient detox programs in the past many years ago. At that time patient states he was at [facility].  Patient has no other complaints.denies SI or HI.   Past Medical History  Diagnosis Date  . Hepatitis C   . Diabetes mellitus     History reviewed. No pertinent past surgical history.  History reviewed. No pertinent family history.  History  Substance Use Topics  . Smoking status: Not on file  . Smokeless tobacco: Not on file  . Alcohol Use: 9.0 oz/week    15 Cans of beer per week     Drank a 12 pack of beer today      Review of Systems  Constitutional: Negative for fever and chills.  Gastrointestinal: Negative for nausea and vomiting.  Neurological: Negative for headaches.  Psychiatric/Behavioral: Negative for suicidal ideas.    Allergies  Sulfa antibiotics  Home Medications   Current Outpatient Rx  Name Route Sig Dispense Refill  . INSULIN GLARGINE 100 UNIT/ML [place] SOLN Subcutaneous Inject 12 Units into the skin at bedtime.       BP 122/79  Pulse 85  Temp 97.7 F (36.5 C) (Oral)  Resp 18  SpO2 92%  Physical Exam  Nursing note and vitals reviewed. Constitutional: He is oriented to person, place, and time. He appears well-developed and  well-nourished. No distress.  HENT:  Head: Normocephalic and atraumatic.  Cardiovascular: Normal rate and regular rhythm.   Pulmonary/Chest: Effort normal and breath sounds normal.  Abdominal: Soft. There is no tenderness. There is no rebound.  Neurological: He is alert and oriented to person, place, and time.  Skin: Skin is warm.  Psychiatric: He has a normal mood and affect. His behavior is normal. He expresses no homicidal and no suicidal ideation.    ED Course  Procedures   Results for orders placed during the hospital encounter of 11/05/11  URINE RAPID DRUG SCREEN (HOSP PERFORMED)      Component Value Range   Opiates NONE DETECTED  NONE DETECTED   Cocaine NONE DETECTED  NONE DETECTED   Benzodiazepines NONE DETECTED  NONE DETECTED   Amphetamines NONE DETECTED  NONE DETECTED   Tetrahydrocannabinol NONE DETECTED  NONE DETECTED   Barbiturates NONE DETECTED  NONE DETECTED  URINALYSIS, ROUTINE W REFLEX MICROSCOPIC      Component Value Range   Color, Urine YELLOW  YELLOW   APPearance CLEAR  CLEAR   Specific Gravity, Urine 1.011  1.005 - 1.030   pH 5.5  5.0 - 8.0   Glucose, UA NEGATIVE  NEGATIVE mg/dL   Hgb urine dipstick TRACE (*) NEGATIVE   Bilirubin Urine NEGATIVE  NEGATIVE   Ketones, ur NEGATIVE  NEGATIVE mg/dL   Protein, ur  NEGATIVE  NEGATIVE mg/dL   Urobilinogen, UA 0.2  0.0 - 1.0 mg/dL   Nitrite NEGATIVE  NEGATIVE   Leukocytes, UA NEGATIVE  NEGATIVE  CBC      Component Value Range   WBC 5.9  4.0 - 10.5 K/uL   RBC 4.53  4.22 - 5.81 MIL/uL   Hemoglobin 16.2  13.0 - 17.0 g/dL   HCT 44.4  39.0 - 52.0 %   MCV 98.0  78.0 - 100.0 fL   MCH 35.8 (*) 26.0 - 34.0 pg   MCHC 36.5 (*) 30.0 - 36.0 g/dL   RDW 13.2  11.5 - 15.5 %   Platelets 65 (*) 150 - 400 K/uL  COMPREHENSIVE METABOLIC PANEL      Component Value Range   Sodium 132 (*) 135 - 145 mEq/L   Potassium 4.1  3.5 - 5.1 mEq/L   Chloride 95 (*) 96 - 112 mEq/L   CO2 25  19 - 32 mEq/L   Glucose, Bld 207 (*) 70 - 99  mg/dL   BUN 7  6 - 23 mg/dL   Creatinine, Ser 0.49 (*) 0.50 - 1.35 mg/dL   Calcium 8.9  8.4 - 10.5 mg/dL   Total Protein 8.5 (*) 6.0 - 8.3 g/dL   Albumin 3.8  3.5 - 5.2 g/dL   AST 77 (*) 0 - 37 U/L   ALT 52  0 - 53 U/L   Alkaline Phosphatase 124 (*) 39 - 117 U/L   Total Bilirubin 0.6  0.3 - 1.2 mg/dL   GFR calc non Af Amer >90  >90 mL/min   GFR calc Af Amer >90  >90 mL/min  ETHANOL      Component Value Range   Alcohol, Ethyl (B) 343 (*) 0 - 11 mg/dL  URINE MICROSCOPIC-ADD ON      Component Value Range   RBC / HPF 0-2  <3 RBC/hpf        1. Alcohol abuse   2. Alcohol intoxication       MDM  5:05AM  Pt seen and evaluated.  Pt in no acute distress.  Spoke with [facility] act team. They will see patient and evaluate for placement.  Patient is voluntary.  Psych holding orders and EtOH withdrawal orders written.     Martie Lee, [place] 11/05/11 [phone]

## 2011-11-05 NOTE — ED Notes (Signed)
Patient has one (1) bag of belongings which includes a cell phone.

## 2011-11-05 NOTE — ED Notes (Signed)
Patient belongings placed in locker 78

## 2011-11-06 ENCOUNTER — Inpatient Hospital Stay (HOSPITAL_COMMUNITY)
Admission: RE | Admit: 2011-11-06 | Discharge: 2011-11-11 | DRG: 885 | Disposition: A | Discharge status: Home / Self Care | Payer: Federal, State, Local not specified - Other | Source: Ambulatory Visit | Attending: Psychiatry | Admitting: Psychiatry

## 2011-11-06 ENCOUNTER — Encounter (HOSPITAL_COMMUNITY): Payer: Self-pay | Admitting: *Deleted

## 2011-11-06 DIAGNOSIS — F10229 Alcohol dependence with intoxication, unspecified: Secondary | ICD-10-CM

## 2011-11-06 DIAGNOSIS — F332 Major depressive disorder, recurrent severe without psychotic features: Principal | ICD-10-CM | POA: Diagnosis present

## 2011-11-06 DIAGNOSIS — F17200 Nicotine dependence, unspecified, uncomplicated: Secondary | ICD-10-CM | POA: Diagnosis present

## 2011-11-06 DIAGNOSIS — D696 Thrombocytopenia, unspecified: Secondary | ICD-10-CM

## 2011-11-06 DIAGNOSIS — G4700 Insomnia, unspecified: Secondary | ICD-10-CM | POA: Diagnosis present

## 2011-11-06 DIAGNOSIS — R739 Hyperglycemia, unspecified: Secondary | ICD-10-CM

## 2011-11-06 DIAGNOSIS — B1920 Unspecified viral hepatitis C without hepatic coma: Secondary | ICD-10-CM

## 2011-11-06 DIAGNOSIS — F10939 Alcohol use, unspecified with withdrawal, unspecified: Secondary | ICD-10-CM | POA: Diagnosis present

## 2011-11-06 DIAGNOSIS — IMO0001 Reserved for inherently not codable concepts without codable children: Secondary | ICD-10-CM

## 2011-11-06 DIAGNOSIS — E871 Hypo-osmolality and hyponatremia: Secondary | ICD-10-CM

## 2011-11-06 DIAGNOSIS — E119 Type 2 diabetes mellitus without complications: Secondary | ICD-10-CM

## 2011-11-06 DIAGNOSIS — K7030 Alcoholic cirrhosis of liver without ascites: Secondary | ICD-10-CM | POA: Diagnosis present

## 2011-11-06 DIAGNOSIS — F10239 Alcohol dependence with withdrawal, unspecified: Secondary | ICD-10-CM | POA: Diagnosis present

## 2011-11-06 HISTORY — DX: Alcohol dependence, uncomplicated: F10.20

## 2011-11-06 HISTORY — DX: Hypo-osmolality and hyponatremia: E87.1

## 2011-11-06 HISTORY — DX: Thrombocytopenia, unspecified: D69.6

## 2011-11-06 HISTORY — DX: Alcoholic cirrhosis of liver without ascites: K70.30

## 2011-11-06 HISTORY — DX: Hyperglycemia, unspecified: R73.9

## 2011-11-06 LAB — GLUCOSE, CAPILLARY
Glucose-Capillary: 275 mg/dL — ABNORMAL HIGH (ref 70–99)
Glucose-Capillary: 299 mg/dL — ABNORMAL HIGH (ref 70–99)

## 2011-11-06 MED ORDER — MAGNESIUM HYDROXIDE 400 MG/5ML PO SUSP: 30.0000 mL | Freq: Every day | ORAL | Status: DC | PRN

## 2011-11-06 MED ORDER — INSULIN ASPART 100 UNIT/ML ~~LOC~~ SOLN
4.0000 [IU] | Freq: Once | SUBCUTANEOUS | Status: AC
  Administered 2011-11-06: 4 [IU] via SUBCUTANEOUS
  Filled 2011-11-06: qty 1

## 2011-11-06 MED ORDER — NICOTINE 21 MG/24HR TD PT24
21.0000 mg | MEDICATED_PATCH | Freq: Every day | TRANSDERMAL | Status: DC
  Filled 2011-11-06: qty 1

## 2011-11-06 MED ORDER — INSULIN GLARGINE 100 UNIT/ML ~~LOC~~ SOLN
6.0000 [IU] | Freq: Every day | SUBCUTANEOUS | Status: DC
  Administered 2011-11-06: 6 [IU] via SUBCUTANEOUS

## 2011-11-06 MED ORDER — CHLORDIAZEPOXIDE HCL 25 MG PO CAPS
25.0000 mg | ORAL_CAPSULE | ORAL | Status: AC
Start: 2011-11-08 — End: 2011-11-09
  Administered 2011-11-08 – 2011-11-09 (×2): 25 mg via ORAL
  Filled 2011-11-06 (×2): qty 1

## 2011-11-06 MED ORDER — INSULIN REGULAR HUMAN 100 UNIT/ML IJ SOLN: 4.0000 [IU] | Freq: Once | INTRAMUSCULAR | Status: DC

## 2011-11-06 MED ORDER — ALUM & MAG HYDROXIDE-SIMETH 200-200-20 MG/5ML PO SUSP: 30.0000 mL | ORAL | Status: DC | PRN

## 2011-11-06 MED ORDER — CHLORDIAZEPOXIDE HCL 25 MG PO CAPS
25.0000 mg | ORAL_CAPSULE | Freq: Three times a day (TID) | ORAL | Status: AC
  Administered 2011-11-07 – 2011-11-08 (×2): 25 mg via ORAL
  Filled 2011-11-06 (×2): qty 1

## 2011-11-06 MED ORDER — ONDANSETRON 4 MG PO TBDP
4.0000 mg | ORAL_TABLET | Freq: Four times a day (QID) | ORAL | Status: AC | PRN
  Administered 2011-11-07: 4 mg via ORAL
  Filled 2011-11-06: qty 1

## 2011-11-06 MED ORDER — VITAMIN B-1 100 MG PO TABS
100.0000 mg | ORAL_TABLET | Freq: Every day | ORAL | Status: DC
  Administered 2011-11-07 – 2011-11-11 (×5): 100 mg via ORAL
  Filled 2011-11-06 (×7): qty 1

## 2011-11-06 MED ORDER — ACETAMINOPHEN 325 MG PO TABS
650.0000 mg | ORAL_TABLET | Freq: Four times a day (QID) | ORAL | Status: DC | PRN
  Administered 2011-11-09: 650 mg via ORAL

## 2011-11-06 MED ORDER — TRAZODONE HCL 100 MG PO TABS
100.0000 mg | ORAL_TABLET | Freq: Every evening | ORAL | Status: DC | PRN
  Administered 2011-11-06 – 2011-11-10 (×3): 100 mg via ORAL
  Filled 2011-11-06 (×4): qty 1

## 2011-11-06 MED ORDER — LOPERAMIDE HCL 2 MG PO CAPS: 2.0000 mg | ORAL_CAPSULE | ORAL | Status: AC | PRN

## 2011-11-06 MED ORDER — CHLORDIAZEPOXIDE HCL 25 MG PO CAPS
25.0000 mg | ORAL_CAPSULE | Freq: Four times a day (QID) | ORAL | Status: AC
  Administered 2011-11-06 – 2011-11-07 (×4): 25 mg via ORAL
  Filled 2011-11-06 (×4): qty 1

## 2011-11-06 MED ORDER — CHLORDIAZEPOXIDE HCL 25 MG PO CAPS
25.0000 mg | ORAL_CAPSULE | Freq: Every day | ORAL | Status: AC
  Administered 2011-11-10: 25 mg via ORAL
  Filled 2011-11-06: qty 1

## 2011-11-06 MED ORDER — CHLORDIAZEPOXIDE HCL 25 MG PO CAPS
25.0000 mg | ORAL_CAPSULE | Freq: Four times a day (QID) | ORAL | Status: AC | PRN
  Administered 2011-11-06 – 2011-11-07 (×3): 25 mg via ORAL
  Filled 2011-11-06 (×3): qty 1

## 2011-11-06 MED ORDER — ADULT MULTIVITAMIN W/MINERALS CH
1.0000 | ORAL_TABLET | Freq: Every day | ORAL | Status: DC
  Administered 2011-11-06 – 2011-11-11 (×6): 1 via ORAL
  Filled 2011-11-06 (×9): qty 1

## 2011-11-06 MED ORDER — HYDROXYZINE HCL 25 MG PO TABS
25.0000 mg | ORAL_TABLET | Freq: Four times a day (QID) | ORAL | Status: AC | PRN
  Administered 2011-11-06 – 2011-11-08 (×5): 25 mg via ORAL
  Filled 2011-11-06: qty 1

## 2011-11-06 MED ORDER — CHLORDIAZEPOXIDE HCL 25 MG PO CAPS
25.0000 mg | ORAL_CAPSULE | Freq: Once | ORAL | Status: AC
  Administered 2011-11-06: 25 mg via ORAL
  Filled 2011-11-06: qty 1

## 2011-11-06 NOTE — ED Provider Notes (Signed)
Medical screening examination/treatment/procedure(s) were conducted as a shared visit with non-physician practitioner(s) and myself.  I personally evaluated the patient during the encounter Pt with etoh abuse, requesting detox/rehab eval. Alert content. No tremor or shakes.   Mirna Mires, MD 11/06/11 1630

## 2011-11-06 NOTE — Progress Notes (Signed)
Patient ID: [name], male   DOB: [birth year], 56 y.o.   MRN: [id number] 11-06-11 @ 1855  D: pt at 1815 had a cbg = 299. A: Called md and he stated he didn't want to give pt any novolog for meal coverage. Pt will get lantus at bedtime. R: no order was obtained and rn will continue to monitor pt.

## 2011-11-06 NOTE — Tx Team (Signed)
Initial Interdisciplinary Treatment Plan  PATIENT STRENGTHS: (choose at least two) Average or above average intelligence Supportive family/friends Work skills  PATIENT STRESSORS: Arts development officer issue Medication change or noncompliance Substance abuse   PROBLEM LIST: Problem List/Patient Goals Date to be addressed Date deferred Reason deferred Estimated date of resolution  etoh dependence/abuse  11-06-11                                                      DISCHARGE CRITERIA:  Improved stabilization in mood, thinking, and/or behavior Verbal commitment to aftercare and medication compliance Withdrawal symptoms are absent or subacute and managed without 24-hour nursing intervention  PRELIMINARY DISCHARGE PLAN: Attend 12-step recovery group Return to previous living arrangement  PATIENT/FAMIILY INVOLVEMENT: This treatment plan has been presented to and reviewed with the patient, [name], and/or family member, .  The patient and family have been given the opportunity to ask questions and make suggestions.  [name] 11/06/2011, 2:59 PM

## 2011-11-06 NOTE — H&P (Signed)
Psychiatric Admission Assessment Adult  Patient Identification:  [name] Date of Evaluation:  11/06/2011 56yo DWM CC: alcohol detox can't afford motel room anyway and is afraid of withdrawal seizures. ETOH 343 History of Present Illness: Reports increasing his use of alcohol the past few months due to relationship issues with GF. Here last September and denies other admissions since then. Wants to get into an [employer] at discharge.  Says he started a month ago to try to stop but couldn't.    Past Psychiatric History: Several alcohol issues more than 20 years.   Substance Abuse History:  Social History:    reports that he has been smoking.  He does not have any smokeless tobacco history on file. He reports that he drinks about 9 ounces of alcohol per week. He reports that he does not use illicit drugs. Some college Married and divorced once .3 sons ages _0 . Is self employed as a Engineer, building services Family Psych History: Parents and brothers are  all alcoholics.   Past Medical History:     Past Medical History  Diagnosis Date  . Hepatitis C   . Diabetes mellitus       History reviewed. No pertinent past surgical history.  Allergies:  Allergies  Allergen Reactions  . Sulfa Antibiotics     Current Medications:  Prior to Admission medications   Medication Sig Start Date End Date Taking? Authorizing Provider  insulin glargine (LANTUS) 100 UNIT/ML injection Inject 12 Units into the skin at bedtime.    Yes Historical Provider, MD    Mental Status Examination/Evaluation: Objective:  Appearance: Disheveled  Psychomotor Activity:  Decreased  Eye Contact::  Good  Speech:  Normal Rate  Volume:  Normal  Mood:irritable     Affect:  Restricted  Thought Process:  Clear rational; goal oriented - go to [employer]   Orientation:  Full  Thought Content:  No AVH/psychosis   Suicidal Thoughts:  No  Homicidal Thoughts:  No  Judgement:  Intact  Insight:   Present    DIAGNOSIS:    AXIS I Alcohol Abuse  AXIS II Cluster B Traits  AXIS III See medical history.  AXIS IV economic problems, housing problems, occupational problems and problems with primary support group had court 10/12 for forgery  AXIS V 51-60 moderate symptoms     Treatment Plan Summary: Admit for medically supported alcohol detox using the [facility] protocol. Agree with H&P from [facility]

## 2011-11-06 NOTE — Progress Notes (Signed)
Patient ID: JOSEL KEO, male   DOB: Aug 20, 1955, 56 y.o.   MRN: [id number]   [facility] Group Notes:  (Counselor/Nursing/MHT/Case Management/Adjunct)  11/06/2011 1:15 PM  Type of Therapy:  Group Therapy, Dance/Movement Therapy   Participation Level:  Did Not Attend Pt. is a new admit.    [place]

## 2011-11-06 NOTE — Progress Notes (Signed)
D.  Pt is a 56 year old [place] male admitted today to the services of Dr. Lockie Pares for detox from ETOH.  Pt shakey and tremulous on approach, denies other complaints.  Pt does express concern that his Lantus scheduled for tonight is at 6 units instead of the 12 units he takes at home.  He will discuss this with the doctor tomorrow.  A.  Medication given as ordered for withdrawal, support and encouragement offered.  Will continue to monitor. R.  Pt sitting in dayroom watching TV until dayroom closed, then to bed with no acute distress noted.  Will continue to monitor for safey

## 2011-11-06 NOTE — Progress Notes (Signed)
Adult Psychosocial Assessment Update Interdisciplinary Team  Previous [facility] admissions/discharges:  Admissions Discharges  Date: 01/30/2011 Date: 02/05/2011  Date: Date:  Date: Date:  Date: Date:  Date: Date:   Changes since the last Psychosocial Assessment (including adherence to outpatient mental health and/or substance abuse treatment, situational issues contributing to decompensation and/or relapse). Pt. had been living in an [facility] and was doing pretty well. 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 Pt left the [facility] when he returned from [place] and had been trying to work steady. Pt was living in shelters and motels.              Discharge Plan 1. Will you be returning to the same living situation after discharge?   Yes: No: X      If no, what is your plan?    Pt is not sure where he will go after discharge. Pt is thinking about another [employer].        2. Would you like a referral for services when you are discharged? Yes: X    If yes, for what services?  No:       Pt. is interested in receiving help paying for medications.        Summary and Recommendations (to be completed by the evaluator) Pt. is a 56 year old male. Recommendations for treatment include crisis stabilization, case management, medication management, psychoeducation to teach coping skills and group therapy.                        Signature:  [name], 11/06/2011 5:21 PM

## 2011-11-06 NOTE — Progress Notes (Signed)
Patient ID: [name], male   DOB: Feb 03, 1956, 56 y.o.   MRN: [id number] 11-06-11 @ 2376 nursing adm note: pt came to [facility] voluntarily for detox from etoh. He stated his last drink of etoh was on 11-04-11. He stated he was drinking 1 case of beer daily. He has had sober time of several years before, he stated. He was having w/d symptoms of anxiety, sweating and tremor. He denied any si/hi/av.  He has been in this facility several times before. He has a medical hx of diabetes, hep c  and neuropathy. His cbg at 1500 was 200.  Prior to adm in this facility from [facility] he was given a nic path 28BT, folic acid 16m, mv and thiamine 1048mall at 0924 am today.  Labs are; etoh was 80 high at 1457, negative uds, cbc wnl except plats 65, mch 35.8 high, mchc 36.5 high.  Called md about the low platelets of 65 and he advised the rn to put in an order for a cbc in the am. cmet was na 132 low, chloride 95 low, creatinine ser 0.49 low, total protein 8.5 high, ast 77 high and  alkaline phosphatase 124 high.  he refused any food on adm but was given fluids. He was cooperative/polite and escorted to the 300 hall.   Contact person: carol mccraw at cell ph # [phone]

## 2011-11-06 NOTE — Progress Notes (Signed)
Pt accepted to [facility] by Dr. Leonides Sake to Dr. Lockie Pares to room 301-1.  Pt can go by security to [facility].  Nurse will call report at [phone] and send over the appropriate forms with security to [facility].

## 2011-11-07 DIAGNOSIS — F1020 Alcohol dependence, uncomplicated: Secondary | ICD-10-CM

## 2011-11-07 DIAGNOSIS — F10239 Alcohol dependence with withdrawal, unspecified: Secondary | ICD-10-CM

## 2011-11-07 DIAGNOSIS — F10939 Alcohol use, unspecified with withdrawal, unspecified: Secondary | ICD-10-CM

## 2011-11-07 DIAGNOSIS — F332 Major depressive disorder, recurrent severe without psychotic features: Principal | ICD-10-CM

## 2011-11-07 LAB — CBC
HCT: 39.2 % (ref 39.0–52.0)
MCH: 35.4 pg — ABNORMAL HIGH (ref 26.0–34.0)
MCHC: 36.5 g/dL — ABNORMAL HIGH (ref 30.0–36.0)
MCV: 97 fL (ref 78.0–100.0)
Platelets: 43 10*3/uL — ABNORMAL LOW (ref 150–400)
RDW: 12.7 % (ref 11.5–15.5)

## 2011-11-07 LAB — GLUCOSE, CAPILLARY
Glucose-Capillary: 270 mg/dL — ABNORMAL HIGH (ref 70–99)
Glucose-Capillary: 312 mg/dL — ABNORMAL HIGH (ref 70–99)
Glucose-Capillary: 325 mg/dL — ABNORMAL HIGH (ref 70–99)

## 2011-11-07 MED ORDER — INSULIN GLARGINE 100 UNIT/ML ~~LOC~~ SOLN
12.0000 [IU] | Freq: Every day | SUBCUTANEOUS | Status: DC
  Administered 2011-11-07: 12 [IU] via SUBCUTANEOUS
  Administered 2011-11-08: 23:00:00 via SUBCUTANEOUS
  Administered 2011-11-09 – 2011-11-10 (×2): 12 [IU] via SUBCUTANEOUS

## 2011-11-07 NOTE — H&P (Signed)
  Pt was seen by me today and I agree with the key elements documented in H&P.

## 2011-11-07 NOTE — Progress Notes (Signed)
  [name] is a 56 y.o. male [id number] Oct 05, 1955  11/06/2011 Principal Problem:  *Alcohol dependence with acute alcoholic intoxication Active Problems:  Diabetes mellitus   Mental Status: Mood slightly improved denies SI/HI/AVH.  Subjective/Objective: Concerned about his blood sugar level but doesn't do sliding scale at home. Encouraged him to go to cafeteria and eat.    Filed Vitals:   11/07/11 1211  BP: 121/85  Pulse: 83  Temp: 97.6 F (36.4 C)  Resp:     Lab Results:   BMET    Component Value Date/Time   NA 132* 11/05/2011 0522   K 4.1 11/05/2011 0522   CL 95* 11/05/2011 0522   CO2 25 11/05/2011 0522   GLUCOSE 207* 11/05/2011 0522   BUN 7 11/05/2011 0522   CREATININE 0.49* 11/05/2011 0522   CALCIUM 8.9 11/05/2011 0522   GFRNONAA >90 11/05/2011 0522   GFRAA >90 11/05/2011 0522    Medications:  Scheduled:     . chlordiazePOXIDE  25 mg Oral Once  . chlordiazePOXIDE  25 mg Oral QID   Followed by  . chlordiazePOXIDE  25 mg Oral TID   Followed by  . chlordiazePOXIDE  25 mg Oral BH-qamhs   Followed by  . chlordiazePOXIDE  25 mg Oral Daily  . insulin glargine  6 Units Subcutaneous QHS  . multivitamin with minerals  1 tablet Oral Daily  . thiamine  100 mg Oral Daily  . DISCONTD: nicotine  21 mg Transdermal Q0600     PRN Meds acetaminophen, alum & mag hydroxide-simeth, chlordiazePOXIDE, hydrOXYzine, loperamide, magnesium hydroxide, ondansetron, traZODone  Plan: continue with current plan of care- Hopes to get into [facility]. Annelies Coyt,MICKIE D. 11/07/2011

## 2011-11-07 NOTE — Progress Notes (Signed)
Patient ID: [name], male   DOB: 1956-01-01, 56 y.o.   MRN: [id number]   [facility] Group Notes:  (Counselor/Nursing/MHT/Case Management/Adjunct)  11/07/2011 1:15 PM  Type of Therapy:  Group Therapy, Dance/Movement Therapy   Participation Level:  Did Not Attend. Pt. was not feeling well.      [place]

## 2011-11-07 NOTE — Progress Notes (Signed)
Patient ID: [name], male   DOB: [birth year], 56 y.o.   MRN: [id number] Pt. did not appear for medications until peers called him: Pt. states he hadn't slept well last night and was asleep until this time.  Pt.denies lethality and A/V/H's; worries about his CBG's and insulin doses and asked to have someone please notify the provider.  Pt. States he takes 12units of Lantus at home is currently receiving a much lower dose and his CBG's are high.   Assured Pt. his concerns would be addressed when providers arrive. Pt. did not turn in a self-inventory sheet. 13:00-Pt. has been able to speak with provider.

## 2011-11-07 NOTE — Progress Notes (Signed)
D.  Pt sitting in dayroom on approach, was positive for evening AA group.  No complaints voiced.  Pt states that he does not wish to take Trazodone for sleep tonight because he felt that last night it caused him to feel "antsy and restless".  Denies SI/HI/hallucinations.  Some anxiety from withdrawal noted, minimal tremors.  A.  Support and encouragement offered, will continue to monitor.  R.  Pt in dayroom watching TV with peers, no distress noted

## 2011-11-07 NOTE — Progress Notes (Signed)
Patient ID: [name], male   DOB: 1955-07-28, 56 y.o.   MRN: [id number]  Pt. did not attend aftercare planning group. Pt. was asleep.

## 2011-11-07 NOTE — Progress Notes (Signed)
[facility] Adult Inpatient Family/Significant Other Suicide Prevention Education  Suicide Prevention Education:  Education Completed; [name] Pt.'s Ex Wife-has been identified by the patient as the family member/significant other with whom the patient will be residing, and identified as the person(s) who will aid the patient in the event of a mental health crisis (suicidal ideations/suicide attempt).  With written consent from the patient, the family member/significant other has been provided the following suicide prevention education, prior to the and/or following the discharge of the patient.  The suicide prevention education provided includes the following:  Suicide risk factors  Suicide prevention and interventions  National Suicide Hotline telephone number  [facility] assessment telephone number  [facility] Emergency Assistance [place] and/or Residential Mobile Crisis Unit telephone number  Request made of family/significant other to:  Remove weapons (e.g., guns, rifles, knives), all items previously/currently identified as safety concern.  Pt.'s ex-wife states the pt. Has no guns or weapons. Pt. Is homeless and been living in [facility] . Pt. Wife has no seen him in 10-15 years but states he keeps in contact with her.  Remove drugs/medications (over-the-counter, prescriptions, illicit drugs), all items previously/currently identified as a safety concern.Pt.'s ex wife had no questions or concerns . Pt.'s ex wife states he only takes medication for diabetes.  Crisis hotline numbers were given but ex wife stated she did not need and that the pt. Would never try to harm himself and that his only problem was alcohol abuse. Pt.'s wife was asked 3 times to write the number down and her reply was that she did not need the. She can be called if any further information is needed.   The family member/significant other verbalizes understanding of the  suicide prevention education information provided.  The family member/significant other agrees to remove the items of safety concern listed above.  [name] 11/07/2011, 4:59 PM

## 2011-11-07 NOTE — [facility] Suicide Risk Assessment (Signed)
Suicide Risk Assessment  Admission Assessment     Demographic factors:  Assessment Details Time of Assessment: Admission Information Obtained From: Patient Current Mental Status:  Current Mental Status:  (denies any si/hi/av. ) Loss Factors:  Loss Factors: Decrease in vocational status;Loss of significant relationship;Legal issues;Financial problems / change in socioeconomic status Historical Factors:  Historical Factors: Family history of mental illness or substance abuse Risk Reduction Factors:  Risk Reduction Factors: Sense of responsibility to family;Religious beliefs about death;Positive social support  CLINICAL FACTORS:   Alcohol/Substance Abuse/Dependencies  COGNITIVE FEATURES THAT CONTRIBUTE TO RISK:  Closed-mindedness    SUICIDE RISK:   Minimal: No identifiable suicidal ideation.  Patients presenting with no risk factors but with morbid ruminations; may be classified as minimal risk based on the severity of the depressive symptoms  PLAN OF CARE:    Mental Status Examination/Evaluation:  Objective: Appearance: Disheveled   Psychomotor Activity: Decreased   Eye Contact:: Good   Speech: Normal Rate   Volume: Normal   Mood:irritable   Affect: Restricted   Thought Process: Clear rational; goal oriented - go to [employer]   Orientation: Full   Thought Content: No AVH/psychosis   Suicidal Thoughts: No   Homicidal Thoughts: No   Judgement: poor  Insight: Poor  DIAGNOSIS:  AXIS I  Alcohol Abuse   AXIS II  Cluster B Traits   AXIS III  See medical history.   AXIS IV  economic problems, housing problems, occupational problems and problems with primary support group had court 10/12 for forgery   AXIS V  51-60 moderate symptoms   Treatment Plan Summary:  Admit for medically supported alcohol detox using the Librium protocol.    Raiford Simmonds 11/07/2011, 5:44 PM

## 2011-11-08 LAB — GLUCOSE, CAPILLARY: Glucose-Capillary: 352 mg/dL — ABNORMAL HIGH (ref 70–99)

## 2011-11-08 NOTE — Progress Notes (Signed)
[facility] MD Progress Note  11/08/2011 2:06 PM  S/O: Patient seen and evaluated. Chart reviewed. Patient stated that his mood was "tired".  Sig w/d s/s noted.  His affect was mood congruent and constricted. He denied any current thoughts of self injurious behavior, suicidal ideation or homicidal ideation. He denied any significant depressive signs or symptoms at this time. There were no auditory or visual hallucinations, paranoia, delusional thought processes, or mania noted.  Thought process was linear and goal directed.  No psychomotor agitation or retardation was noted. His speech was normal rate, tone and volume. Eye contact was good. Judgment and insight are fair.  No acute safety concerns reported from team.  Sleep:  Number of Hours: 4.75    Vital Signs:Blood pressure 101/64, pulse 101, temperature 97 F (36.1 C), temperature source Oral, resp. rate 20, height 6' (1.829 m), weight 77.111 kg (170 lb).  Current Medications:    . chlordiazePOXIDE  25 mg Oral TID   Followed by  . chlordiazePOXIDE  25 mg Oral BH-qamhs   Followed by  . chlordiazePOXIDE  25 mg Oral Daily  . insulin glargine  12 Units Subcutaneous QHS  . multivitamin with minerals  1 tablet Oral Daily  . thiamine  100 mg Oral Daily  . DISCONTD: insulin glargine  6 Units Subcutaneous QHS    Lab Results:  Results for orders placed during the hospital encounter of 11/06/11 (from the past 48 hour(s))  GLUCOSE, CAPILLARY     Status: Abnormal   Collection Time   11/06/11  3:00 PM      Component Value Range Comment   Glucose-Capillary 200 (*) 70 - 99 mg/dL    Comment 1 Notify RN      Comment 2 Documented in Chart     GLUCOSE, CAPILLARY     Status: Abnormal   Collection Time   11/06/11  6:15 PM      Component Value Range Comment   Glucose-Capillary 299 (*) 70 - 99 mg/dL    Comment 1 Notify RN      Comment 2 Documented in Chart     GLUCOSE, CAPILLARY     Status: Abnormal   Collection Time   11/06/11  9:13 PM      Component Value  Range Comment   Glucose-Capillary 273 (*) 70 - 99 mg/dL    Comment 1 Notify RN      Comment 2 Documented in Chart     GLUCOSE, CAPILLARY     Status: Abnormal   Collection Time   11/07/11  6:03 AM      Component Value Range Comment   Glucose-Capillary 270 (*) 70 - 99 mg/dL    Comment 1 Notify RN     CBC     Status: Abnormal   Collection Time   11/07/11  7:05 AM      Component Value Range Comment   WBC 5.5  4.0 - 10.5 K/uL    RBC 4.04 (*) 4.22 - 5.81 MIL/uL    Hemoglobin 14.3  13.0 - 17.0 g/dL    HCT 39.2  39.0 - 52.0 %    MCV 97.0  78.0 - 100.0 fL    MCH 35.4 (*) 26.0 - 34.0 pg    MCHC 36.5 (*) 30.0 - 36.0 g/dL    RDW 12.7  11.5 - 15.5 %    Platelets 43 (*) 150 - 400 K/uL   GLUCOSE, CAPILLARY     Status: Abnormal   Collection Time   11/07/11 11:38  AM      Component Value Range Comment   Glucose-Capillary 312 (*) 70 - 99 mg/dL   GLUCOSE, CAPILLARY     Status: Abnormal   Collection Time   11/07/11  5:15 PM      Component Value Range Comment   Glucose-Capillary 325 (*) 70 - 99 mg/dL   GLUCOSE, CAPILLARY     Status: Abnormal   Collection Time   11/07/11  9:16 PM      Component Value Range Comment   Glucose-Capillary 288 (*) 70 - 99 mg/dL   GLUCOSE, CAPILLARY     Status: Abnormal   Collection Time   11/08/11  6:05 AM      Component Value Range Comment   Glucose-Capillary 224 (*) 70 - 99 mg/dL    Comment 1 Notify RN     GLUCOSE, CAPILLARY     Status: Abnormal   Collection Time   11/08/11 12:04 PM      Component Value Range Comment   Glucose-Capillary 307 (*) 70 - 99 mg/dL    Comment 1 Notify RN       Physical Findings: CIWA:  CIWA-Ar Total: 3   A/P: Alcohol Dependence & W/D; DM; Insomnia;Thrombocytopenia; Hyponatremia; Elevated LFTs  Reviewed labs with pt, repeat CBC and CMP in am.  Continue current meds.  No acute safety concerns.  D/c Weds. Pt calling [facility], not interested in residential Tx.    Cheryll Cockayne 11/08/2011, 2:06 PM

## 2011-11-08 NOTE — Discharge Planning (Signed)
[name] attended AM group, good participation.  Complaining about withdrawal symptoms.  States he has been on a binge for a week.  Lives in a hotel.  Working as a Curator.  Angry that boss left, leaving him in charge, and was getting no other help.  Does not want rehab.  States this is the money making time of year.

## 2011-11-08 NOTE — Progress Notes (Signed)
[facility] Group Notes:  (Counselor/Nursing/MHT/Case Management/Adjunct)  11/08/2011 7:10 PM  Type of Therapy:  Psychoeducational Skills  Participation Level:  Did Not Attend    [name] 11/08/2011, 7:10 PM

## 2011-11-08 NOTE — Progress Notes (Signed)
Inpatient Diabetes Program Recommendations  AACE/ADA: New Consensus Statement on Inpatient Glycemic Control (2009)  Target Ranges:  Prepandial:   less than 140 mg/dL      Peak postprandial:   less than 180 mg/dL (1-2 hours)      Critically ill patients:  140 - 180 mg/dL   Reason for Visit: Hyperglycemia  Results for [name], [name] (MRN [id number]) as of 11/08/2011 15:44  Ref. Range 11/05/2011 05:22  Sodium Latest Range: 135-145 mEq/L 132 (L)  Potassium Latest Range: 3.5-5.1 mEq/L 4.1  Chloride Latest Range: 96-112 mEq/L 95 (L)  CO2 Latest Range: 19-32 mEq/L 25  BUN Latest Range: 6-23 mg/dL 7  Creat Latest Range: 0.50-1.35 mg/dL 0.49 (L)  Calcium Latest Range: 8.4-10.5 mg/dL 8.9  GFR calc non Af Amer Latest Range: >90 mL/min >90  GFR calc Af Amer Latest Range: >90 mL/min >90  Glucose Latest Range: 70-99 mg/dL 207 (H)  Results for [name], [name] (MRN [id number]) as of 11/08/2011 15:44  Ref. Range 11/07/2011 11:38 11/07/2011 17:15 11/07/2011 21:16 11/08/2011 06:05 11/08/2011 12:04  Glucose-Capillary Latest Range: 70-99 mg/dL 312 (H) 325 (H) 288 (H) 224 (H) 307 (H)  56 yo male came to [facility] voluntarily for detox from etoh. He stated his last drink of etoh was on 11-04-11. He stated he was drinking 1 case of beer daily. He has had sober time of several years before, he stated. He was having w/d symptoms of anxiety, sweating and tremor. He denied any si/hi/av. He has been in this facility several times before. He has a medical hx of diabetes, hep c and neuropathy.    Inpatient Diabetes Program Recommendations Correction (SSI): Add Novolog moderate tidwc and hs Insulin - Meal Coverage: Will likely need meal coverage insulin HgbA1C: Need updated HgbA1C to assess glycemic control prior to hospitalization  Note: Will follow.

## 2011-11-08 NOTE — Progress Notes (Addendum)
D slept poorly last nite d/t meds, appetite is poor today, feels bad d/t detoxing, has tremors, chills, agitation, denies SI or HI, no Av/H has been having cold sweats, states his pain is at 5/10 but his goal is 0/10, his plan when he goes home is to not drink nor smoke, stated he feels 5 days here is enough for detox. Is having no problems w/his meds at this time. A q96mns safety checks continue and support offered, feels really tired, did  attend group today but sleepy R patient remains safe and encouraged to attend group to get the most out of his time here

## 2011-11-08 NOTE — Progress Notes (Signed)
[facility] Group Notes:  (Counselor/Nursing/MHT/Case Management/Adjunct)  11/08/2011 2:43 PM  Type of Therapy:  Group Therapy from 1:15 to 2:30  Participation Level:  Minimal  Participation Quality:  Drowsy and Inattentive  Affect:  Flat  Cognitive:  Oriented  Insight:  Limited  Engagement in Group:  Limited  Engagement in Therapy:  None  Modes of Intervention:  Clarification, Orientation, Problem-solving, Socialization and Support  Summary of Progress/Problems:  Group discussion focused on what patient's see as their own obstacles to recovery.  Jafar shared that he was not feeling well during this detox particularly.  "It keeps getting worse and worse each time I relapse." Patient was called out early to meet with medical staff by MHT.    [name] 11/08/2011, 2:49 PM

## 2011-11-09 LAB — COMPREHENSIVE METABOLIC PANEL
ALT: 31 U/L (ref 0–53)
AST: 31 U/L (ref 0–37)
CO2: 28 mEq/L (ref 19–32)
Chloride: 96 mEq/L (ref 96–112)
GFR calc non Af Amer: >90 mL/min (ref >90)
Sodium: 130 mEq/L — ABNORMAL LOW (ref 135–145)
Total Bilirubin: 0.6 mg/dL (ref 0.3–1.2)

## 2011-11-09 LAB — CBC
MCH: 35.3 pg — ABNORMAL HIGH (ref 26.0–34.0)
MCHC: 35.6 g/dL (ref 30.0–36.0)
Platelets: 61 10*3/uL — ABNORMAL LOW (ref 150–400)
RDW: 12.7 % (ref 11.5–15.5)

## 2011-11-09 NOTE — Progress Notes (Signed)
D: Pt denies SI/HI/AV. Pt does not attend groups and is very preoccupied with complaining about staff. Pt is very negative and has to be redirected.  Pt also complains of nausea, agitation. And anxiety.   A: Pt was offered support and encouragement. Pt was given scheduled medications. Pt was encourage to attend groups. Q 15 minute checks were done for safety.  R:Pt does not attend groups but is able to interact well with peers and staff. Pt is taking medication.Pt safety maintained on unit.

## 2011-11-09 NOTE — Progress Notes (Signed)
D:  Patient states that he slept poorly, and his appetite is poor.  He reports low energy and refuses to get out of bed for some groups. He states that he doesn't feel well due to not receiving Lantus at night correctly until last night.    Denies depression and hopelessness.   A:  Provided emotional support and completed safety checks. R:  Safety maintained on unit.

## 2011-11-09 NOTE — Progress Notes (Signed)
Psychoeducational Group Note  Date:  11/09/2011 Time:  1100  Group Topic/Focus:  Recovery Goals:   The focus of this group is to identify appropriate goals for recovery and establish a plan to achieve them.  Participation Level: Did Not Attend  Participation Quality:  Not Applicable  Affect:  Not Applicable  Cognitive:  Not Applicable  Insight:  Not Applicable  Engagement in Group:  Not Applicable  Additional Comments:  Pt. Stated he did not feel well enough to attend Group.  [name] 11/09/2011, 1:59 PM

## 2011-11-09 NOTE — Progress Notes (Signed)
Patient ID: [name], male   DOB: 1955-11-04, 56 y.o.   MRN: [id number]  D: Pt was slightly irritated and fixated on previous treatments, doctors and meds.  Told the writer of how a doctor attempted to give his insulin inj in his muscle, and how one doctor attempted to aspirate when giving in his abd. Pt was upset that we ([facility]) didn't continue his ativan. "The other hospital gave me all my medicines with no questions".   A:  Redirected pt and adm meds. 15 minute checks continued.  R: Pt remains safe.

## 2011-11-09 NOTE — Progress Notes (Signed)
Recreation Therapy Notes  11/09/2011         Time: 1500      Group Topic/Focus: The focus of this group is on enhancing the patient's understanding of leisure, barriers to leisure, and the importance of engaging in positive leisure activities upon discharge for improved total health.   Participation Level: Active  Participation Quality: Intrusive, Redirectable and Resistant  Affect: Labile  Cognitive: Oriented   Additional Comments: Patient initially very irritable, reported he didn't come here for programming or to be treated rudely by staff. Patient was yelling loudly and required some redirection, but was able to calm down. Patient then participated appropriately in group and reported feeling "less pissed off" than before and apologized for talking to R.T. The way he had at the beginning of group. Patient reported he enjoys to exercise- was encouraged to use this for a positive leisure activity and stress relief upon discharge.    [name] 11/09/2011 4:18 PM

## 2011-11-09 NOTE — Progress Notes (Addendum)
[facility] Group Notes:  (Counselor/Nursing/MHT/Case Management/Adjunct)  11/09/2011 11:47 AM  Type of Therapy:  Psychoeducational Skills  Participation Level:  Minimal  Participation Quality:  Attentive, Intrusive and Resistant  Affect:  Irritable  Cognitive:  Oriented  Insight:  Limited  Engagement in Group:  Limited  Engagement in Therapy:  n/a  Modes of Intervention:  Activity, Clarification, Education, Limit-setting, Problem-solving, Socialization and Support  Summary of Progress/Problems: Chief Operating Officer attended Psychoeducational group on labels. 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 [name] was intrusive while group discussed what labels are, how we use them, and listed positive and negative labels they have used or been called. [name] was given a homework assignment to list 10 words he has been labeled to find the reality of the situation/label.    [name] 11/09/2011, 11:47 AM

## 2011-11-09 NOTE — Treatment Plan (Signed)
Interdisciplinary Treatment Plan Update (Adult)  Date: 11/09/2011  Time Reviewed: 10:12 AM   Progress in Treatment: Attending groups: Yes Participating in groups: Yes Taking medication as prescribed: Yes Tolerating medication: Yes   Family/Significant other contact made:No   Patient understands diagnosis:  Yes  As evidenced by asking for help with alcohol detox Discussing patient identified problems/goals with staff:  Yes  See below Medical problems stabilized or resolved:  Yes Denies suicidal/homicidal ideation: Yes  In tx team Issues/concerns per patient self-inventory:  Yes  Poor sleep, withdrawal symptoms Other:  New problem(s) identified: N/A  Reason for Continuation of Hospitalization: Medication stabilization Withdrawal symptoms  Interventions implemented related to continuation of hospitalization: Librium taper  Encourage group attendance and participation  Additional comments:  Estimated length of stay: 1-2 days  Discharge Plan: Return home, follow up AA mtgs  New goal(s): N/A  Review of initial/current patient goals per problem list:   1.  Goal(s):Safely detox from alcohol  Met:  No  Target date:7/3  As evidenced QQ:VZDGLO vitals, no withdrawal symptoms  2.  Goal (s): Identify comprehensive sobriety plan  Met:  No  Target date:7/3  As evidenced by: Self report  3.  Goal(s): Try to get an outpt PCP provider  Met:  No  Target date:7/3  As evidenced [id number] of appointment   4.  Goal(s):  Met:  No  Target date:  As evidenced by:  Attendees: Patient:  [name] 11/09/2011 10:12 AM  Family:     Physician:  Cheryll Cockayne 11/09/2011 10:12 AM   Nursing:  Lona Millard  11/09/2011 10:12 AM   Case Manager:  Ripley Fraise, LCSW 11/09/2011 10:12 AM   Counselor:  Frutoso Chase, [place] 11/09/2011 10:12 AM   Other:     Other:     Other:     Other:      Scribe for Treatment Team:   Trish Mage, 11/09/2011 10:12 AM

## 2011-11-09 NOTE — Progress Notes (Signed)
[facility] Group Notes:  (Counselor/Nursing/MHT/Case Management/Adjunct)  11/09/2011 2:38 PM  Type of Therapy:  Group Therapy at 1:15 to 2:30PM  Participation Level:  Did Not Attend   [name] 11/09/2011, 2:38 PM

## 2011-11-10 LAB — GLUCOSE, CAPILLARY
Glucose-Capillary: 336 mg/dL — ABNORMAL HIGH (ref 70–99)
Glucose-Capillary: 350 mg/dL — ABNORMAL HIGH (ref 70–99)
Glucose-Capillary: 246 mg/dL — ABNORMAL HIGH (ref 70–99)
Glucose-Capillary: 309 mg/dL — ABNORMAL HIGH (ref 70–99)

## 2011-11-10 MED ORDER — INSULIN ASPART 100 UNIT/ML ~~LOC~~ SOLN
15.0000 [IU] | Freq: Once | SUBCUTANEOUS | Status: AC
  Administered 2011-11-10: 15 [IU] via SUBCUTANEOUS

## 2011-11-10 MED ORDER — INSULIN ASPART 100 UNIT/ML ~~LOC~~ SOLN
0.0000 [IU] | Freq: Three times a day (TID) | SUBCUTANEOUS | Status: DC
  Administered 2011-11-11: 8 [IU] via SUBCUTANEOUS
  Administered 2011-11-11: 5 [IU] via SUBCUTANEOUS

## 2011-11-10 MED ORDER — METFORMIN HCL 500 MG PO TABS
500.0000 mg | ORAL_TABLET | Freq: Two times a day (BID) | ORAL | Status: DC
  Administered 2011-11-10 – 2011-11-11 (×2): 500 mg via ORAL
  Filled 2011-11-10 (×8): qty 1

## 2011-11-10 NOTE — Progress Notes (Signed)
[facility] MD Progress Note   11/10/2011 12:37 PM  S/O: Patient seen and evaluated in team. Last dose of librium was this am. Chart reviewed. Patient stated that his mood was "ok".  Pt homeless and trying to figure out where he is going to stay s/p Tx.  No clear disposition solidified at this time. [facility] placement hopeful this evening.  His affect was mood congruent and constricted. He denied any current thoughts of self injurious behavior, suicidal ideation or homicidal ideation. He denied any significant depressive signs or symptoms at this time. There were no auditory or visual hallucinations, paranoia, delusional thought processes, or mania noted.  Thought process was linear and goal directed.  No psychomotor agitation or retardation was noted. His speech was normal rate, tone and volume. Eye contact was good. Judgment and insight are fair.  No acute safety concerns reported from team.  Pt not motivated and staff has had to continually encourage group participation.   Sleep:  Number of Hours: 6.25    Vital Signs:Blood pressure 87/59, pulse 85, temperature 97.2 F (36.2 C), temperature source Oral, resp. rate 16, height 6' (1.829 m), weight 77.111 kg (170 lb).  Current Medications:    . chlordiazePOXIDE  25 mg Oral Daily  . insulin aspart  15 Units Subcutaneous Once  . insulin glargine  12 Units Subcutaneous QHS  . multivitamin with minerals  1 tablet Oral Daily  . thiamine  100 mg Oral Daily    Physical Findings: CIWA:  CIWA-Ar Total: 1   A/P: Alcohol Dependence & W/D; DM; Insomnia;Thrombocytopenia; Hyponatremia; Elevated LFTs  Pt seen and evaluated in treatment team.  Reviewed short term and long term goals, medications, current treatment in the hospital and acute/chronic safety.  Pt denied any current thoughts of self harm, suicidal ideation or homicidal ideation.  Contracted for safety on the unit.  No acute issues noted.  VS reviewed with team.  Pt agreeable with treatment plan, see  orders.  Pt calling [facility], not interested in residential Tx.  Will start SSI for hyperglycemia.  Discussed with team.  Will also place IM consult in light of comorbid medical issues and hyponatremia.  Cheryll Cockayne 11/10/2011, 12:37 PM

## 2011-11-10 NOTE — Progress Notes (Signed)
[facility] MD Progress Note   11/10/2011 11:33 AM  S/O: Patient seen and evaluated in team. Chart reviewed. Patient stated that his mood was "ok".  Pt homeless and trying to figure out where he is going to stay s/p Tx. His affect was mood congruent and constricted. He denied any current thoughts of self injurious behavior, suicidal ideation or homicidal ideation. He denied any significant depressive signs or symptoms at this time. There were no auditory or visual hallucinations, paranoia, delusional thought processes, or mania noted.  Thought process was linear and goal directed.  No psychomotor agitation or retardation was noted. His speech was normal rate, tone and volume. Eye contact was good. Judgment and insight are fair.  No acute safety concerns reported from team.  Sleep:  Number of Hours: 6.25    Vital Signs:Blood pressure 87/59, pulse 85, temperature 97.2 F (36.2 C), temperature source Oral, resp. rate 16, height 6' (1.829 m), weight 77.111 kg (170 lb).  Current Medications:    . chlordiazePOXIDE  25 mg Oral Daily  . insulin glargine  12 Units Subcutaneous QHS  . multivitamin with minerals  1 tablet Oral Daily  . thiamine  100 mg Oral Daily    Physical Findings: CIWA:  CIWA-Ar Total: 1   A/P: Alcohol Dependence & W/D; DM; Insomnia;Thrombocytopenia; Hyponatremia; Elevated LFTs  Pt seen and evaluated in treatment team.  Reviewed short term and long term goals, medications, current treatment in the hospital and acute/chronic safety.  Pt denied any current thoughts of self harm, suicidal ideation or homicidal ideation.  Contracted for safety on the unit.  No acute issues noted.  VS reviewed with team.  Pt agreeable with treatment plan, see orders.  Pt calling [facility], not interested in residential Tx.    [name] 11/10/2011, 11:33 AM

## 2011-11-10 NOTE — Progress Notes (Signed)
[facility] Group Notes:  (Counselor/Nursing/MHT/Case Management/Adjunct)  11/10/2011 3:44 PM  Type of Therapy:  Group Therapy at 1:15 to 2:30  Participation Level:  Active  Participation Quality:  Attentive and Sharing  Affect:  Argumentative  Cognitive:  Alert and Oriented  Insight:  Limited  Engagement in Group:  Limited  Engagement in Therapy:  Limited  Modes of Intervention:  Limit-setting, Socialization and Support  Summary of Progress/Problems:  Group session in which patients explored emotional triggers and reactions verses emotional regulation. Several patients were able to make distinctions between a reaction and a response while [name] was more focused on what others have done to him that emotionally harmed him through the years.  [name] did respond to limit setting regarding religious comparisons and comparisons between his father and step father.    [name] 11/10/2011, 4:00 PM

## 2011-11-10 NOTE — Progress Notes (Signed)
D:  Patient reports sleeping poorly and has a low energy level.  States that he is continuing to have withdrawal symptoms, but is not requesting any medication.  Denies SI/HI/AVH.  Blood sugar at lunch was 468.  Patient voiced concerns about discharge because he doesn't have a place to go and he is not feeling good due to high blood sugar.  Patient was not going to groups, but he did attend after reinforcement from treatment team.    A:  Administered Novolog 15 units for high blood sugar.  Provided emotional support and encouraged patient to go to group.  Safety checks q 15 minutes.    R:  Patient continues to state that he feels badly, but he is going to groups and is interacting with staff and other patients in the dayroom.

## 2011-11-10 NOTE — Progress Notes (Signed)
D   Pt is resting in bed with his eyes closed   He does not appear to be in any distress  His respirations are even and unlabored   A   Continue to monitor and Q 15 min checks R   Pt remains safe at present

## 2011-11-10 NOTE — Progress Notes (Signed)
Psychoeducational Group Note  Date:  11/10/2011 Time:  2214  Group Topic/Focus:  Wrap-Up Group:   The focus of this group is to help patients review their daily goal of treatment and discuss progress on daily workbooks.  Participation Level:  Active  Participation Quality:  Appropriate and Supportive  Affect:  Appropriate  Cognitive:  Appropriate  Insight:  Good  Engagement in Group:  Good  Additional Comments:  Pt attended last 10 minutes of NA group tonight.  Permission from charge nurse to make a call to [facility] at 2000.  Labs were completed during group as well.  Carlye Grippe, Mahum Betten L 11/10/2011, 10:14 PM

## 2011-11-10 NOTE — Progress Notes (Signed)
[facility] Group Notes:  (Counselor/Nursing/MHT/Case Management/Adjunct)  11/10/2011 2:02 PM  Type of Therapy:  Psychoeducational Skills  Participation Level:  Active  Participation Quality:  Appropriate  Affect:  Appropriate  Cognitive:  Appropriate  Insight:  Good  Engagement in Group:  Good  Engagement in Therapy:  Good  Modes of Intervention:  Education and Support  Summary of Progress/Problems:   [name] 11/10/2011, 2:02 PM

## 2011-11-11 ENCOUNTER — Encounter (HOSPITAL_COMMUNITY): Payer: Self-pay | Admitting: Family Medicine

## 2011-11-11 DIAGNOSIS — F1020 Alcohol dependence, uncomplicated: Secondary | ICD-10-CM | POA: Insufficient documentation

## 2011-11-11 DIAGNOSIS — R739 Hyperglycemia, unspecified: Secondary | ICD-10-CM | POA: Insufficient documentation

## 2011-11-11 DIAGNOSIS — E871 Hypo-osmolality and hyponatremia: Secondary | ICD-10-CM

## 2011-11-11 DIAGNOSIS — D696 Thrombocytopenia, unspecified: Secondary | ICD-10-CM | POA: Insufficient documentation

## 2011-11-11 DIAGNOSIS — F10229 Alcohol dependence with intoxication, unspecified: Secondary | ICD-10-CM

## 2011-11-11 DIAGNOSIS — B1920 Unspecified viral hepatitis C without hepatic coma: Secondary | ICD-10-CM

## 2011-11-11 DIAGNOSIS — E119 Type 2 diabetes mellitus without complications: Secondary | ICD-10-CM

## 2011-11-11 LAB — GLUCOSE, CAPILLARY
Glucose-Capillary: 214 mg/dL — ABNORMAL HIGH (ref 70–99)
Glucose-Capillary: 266 mg/dL — ABNORMAL HIGH (ref 70–99)

## 2011-11-11 LAB — HEMOGLOBIN A1C
Hgb A1c MFr Bld: 8.8 % — ABNORMAL HIGH (ref <5.7)
Mean Plasma Glucose: 206 mg/dL — ABNORMAL HIGH (ref <117)

## 2011-11-11 MED ORDER — INSULIN GLARGINE 100 UNIT/ML ~~LOC~~ SOLN: 12.0000 [IU] | Freq: Every day | SUBCUTANEOUS | Status: DC

## 2011-11-11 MED ORDER — THIAMINE HCL 100 MG PO TABS: 100.0000 mg | ORAL_TABLET | Freq: Every day | ORAL | Status: DC

## 2011-11-11 MED ORDER — METFORMIN HCL ER 750 MG PO TB24: 750.0000 mg | ORAL_TABLET | Freq: Two times a day (BID) | ORAL | Status: DC

## 2011-11-11 MED ORDER — ADULT MULTIVITAMIN W/MINERALS CH: 1.0000 | ORAL_TABLET | Freq: Every day | ORAL | Status: DC

## 2011-11-11 MED ORDER — METFORMIN HCL 500 MG PO TABS: 500.0000 mg | ORAL_TABLET | Freq: Two times a day (BID) | ORAL | Status: DC

## 2011-11-11 MED ORDER — METFORMIN HCL ER 750 MG PO TB24
750.0000 mg | ORAL_TABLET | Freq: Two times a day (BID) | ORAL | Status: DC
  Filled 2011-11-11 (×2): qty 1

## 2011-11-11 NOTE — Progress Notes (Signed)
Writer provided suicide prevention education directly to patient on morning of November 10, 2011 as patient was to contact [employer] and arrange intake interview as plan was to discharge. Suicide Prevention Education was completed earlier in patient's stay with patient's ex-wife, yet writer had one to one conversation with patient.  Conversation included risk factors, warning signs and resources to contact for help. Mobile crisis services explained and contact card placed in chart for pt to receive at discharge along with [facility] AA Schedule.  [name] 11/11/2011 11:58 AM

## 2011-11-11 NOTE — [facility] Suicide Risk Assessment (Signed)
Suicide Risk Assessment  Discharge Assessment     Demographic factors:  Male;Divorced or widowed;Caucasian;Living alone    Current Mental Status Per Nursing Assessment::   On Admission:   (denies any si/hi/av. ) At Discharge:     Current Mental Status Per Physician: Patient stated that his mood is a little apprehensive, but confident that he will make it. Pt homeless and trying to figure out where he is going to stay s/p Tx. No clear disposition solidified at this time. [facility] placement hopeful this evening. His affect was mood congruent and constricted. He denied any current thoughts of self injurious behavior, suicidal ideation or homicidal ideation. He denied any significant depressive signs or symptoms at this time. There were no auditory or visual hallucinations, paranoia, delusional thought processes, or mania noted. Thought process was linear and goal directed. No psychomotor agitation or retardation was noted. His speech was normal rate, tone and volume. Eye contact was good. Judgment and insight are fair. No acute safety concerns reported from team. Pt not motivated and staff has had to continually encourage group participation.   Loss Factors: Decrease in vocational status;Loss of significant relationship;Legal issues;Financial problems / change in socioeconomic status  Historical Factors: Family history of mental illness or substance abuse  Continued Clinical Symptoms:  Depression:   Comorbid alcohol abuse/dependence Alcohol/Substance Abuse/Dependencies Previous Psychiatric Diagnoses and Treatments  Discharge Diagnoses:   AXIS I:  Alcohol Abuse and Major Depression, Recurrent severe AXIS II:  Deferred AXIS III:   Past Medical History  Diagnosis Date  . Hepatitis C   . Diabetes mellitus   . Chronic hyponatremia   . Alcoholic   . Alcoholic cirrhosis of liver   . Thrombocytopenia    AXIS IV:  other psychosocial or environmental problems AXIS V:  61-70 mild  symptoms  Cognitive Features That Contribute To Risk:  Thought constriction (tunnel vision)    Suicide Risk:  Minimal: No identifiable suicidal ideation.  Patients presenting with no risk factors but with morbid ruminations; may be classified as minimal risk based on the severity of the depressive symptoms  LAB RESULTS: Results for orders placed during the hospital encounter of 11/06/11 (from the past 72 hour(s))  GLUCOSE, CAPILLARY     Status: Abnormal   Collection Time   11/08/11  5:19 PM      Component Value Range Comment   Glucose-Capillary 250 (*) 70 - 99 mg/dL    Comment 1 Notify RN     GLUCOSE, CAPILLARY     Status: Abnormal   Collection Time   11/08/11  9:59 PM      Component Value Range Comment   Glucose-Capillary 352 (*) 70 - 99 mg/dL    Comment 1 Notify RN     GLUCOSE, CAPILLARY     Status: Abnormal   Collection Time   11/09/11  6:05 AM      Component Value Range Comment   Glucose-Capillary 253 (*) 70 - 99 mg/dL   COMPREHENSIVE METABOLIC PANEL     Status: Abnormal   Collection Time   11/09/11  6:25 AM      Component Value Range Comment   Sodium 130 (*) 135 - 145 mEq/L    Potassium 4.2  3.5 - 5.1 mEq/L    Chloride 96  96 - 112 mEq/L    CO2 28  19 - 32 mEq/L    Glucose, Bld 241 (*) 70 - 99 mg/dL    BUN 14  6 - 23 mg/dL  Creatinine, Ser 0.54  0.50 - 1.35 mg/dL    Calcium 9.4  8.4 - 10.5 mg/dL    Total Protein 7.3  6.0 - 8.3 g/dL    Albumin 3.3 (*) 3.5 - 5.2 g/dL    AST 31  0 - 37 U/L    ALT 31  0 - 53 U/L    Alkaline Phosphatase 157 (*) 39 - 117 U/L    Total Bilirubin 0.6  0.3 - 1.2 mg/dL    GFR calc non Af Amer >90  >90 mL/min    GFR calc Af Amer >90  >90 mL/min   CBC     Status: Abnormal   Collection Time   11/09/11  6:25 AM      Component Value Range Comment   WBC 5.4  4.0 - 10.5 K/uL    RBC 4.22  4.22 - 5.81 MIL/uL    Hemoglobin 14.9  13.0 - 17.0 g/dL    HCT 41.8  39.0 - 52.0 %    MCV 99.1  78.0 - 100.0 fL    MCH 35.3 (*) 26.0 - 34.0 pg    MCHC 35.6  30.0  - 36.0 g/dL    RDW 12.7  11.5 - 15.5 %    Platelets 61 (*) 150 - 400 K/uL   GLUCOSE, CAPILLARY     Status: Abnormal   Collection Time   11/09/11 12:00 PM      Component Value Range Comment   Glucose-Capillary 336 (*) 70 - 99 mg/dL    Comment 1 Notify RN     GLUCOSE, CAPILLARY     Status: Abnormal   Collection Time   11/09/11  5:12 PM      Component Value Range Comment   Glucose-Capillary 311 (*) 70 - 99 mg/dL    Comment 1 Notify RN     GLUCOSE, CAPILLARY     Status: Abnormal   Collection Time   11/09/11  9:07 PM      Component Value Range Comment   Glucose-Capillary 350 (*) 70 - 99 mg/dL    Comment 1 Notify RN     GLUCOSE, CAPILLARY     Status: Abnormal   Collection Time   11/10/11  6:04 AM      Component Value Range Comment   Glucose-Capillary 236 (*) 70 - 99 mg/dL   GLUCOSE, CAPILLARY     Status: Abnormal   Collection Time   11/10/11 11:56 AM      Component Value Range Comment   Glucose-Capillary 468 (*) 70 - 99 mg/dL    Comment 1 Notify RN     GLUCOSE, CAPILLARY     Status: Abnormal   Collection Time   11/10/11  3:00 PM      Component Value Range Comment   Glucose-Capillary 246 (*) 70 - 99 mg/dL    Comment 1 Notify RN     GLUCOSE, CAPILLARY     Status: Abnormal   Collection Time   11/10/11  4:56 PM      Component Value Range Comment   Glucose-Capillary 284 (*) 70 - 99 mg/dL   HEMOGLOBIN A1C     Status: Abnormal   Collection Time   11/10/11  8:21 PM      Component Value Range Comment   Hemoglobin A1C 8.8 (*) <5.7 %    Mean Plasma Glucose 206 (*) <117 mg/dL   GLUCOSE, CAPILLARY     Status: Abnormal   Collection Time   11/10/11  9:13 PM  Component Value Range Comment   Glucose-Capillary 309 (*) 70 - 99 mg/dL   GLUCOSE, CAPILLARY     Status: Abnormal   Collection Time   11/11/11  6:10 AM      Component Value Range Comment   Glucose-Capillary 214 (*) 70 - 99 mg/dL   GLUCOSE, CAPILLARY     Status: Abnormal   Collection Time   11/11/11 11:47 AM      Component Value Range  Comment   Glucose-Capillary 266 (*) 70 - 99 mg/dL     RISK REDUCTION FACTORS: What pt has learned from hospital stay is that if it ever happens again, seek treatment earlier  Risk of self harm is elevated by his depression and addictions, but he has decided that he has his family to live for.  He was challenged to attend [facility].  Risk of harm to others is minimal in that he has not been involved in fights or had any legal charges filed on him.  Pt seen in treatment team where he divulged the above information. The treatment team concluded that he was ready for discharge and had met his goals for an inpatient setting.  PLAN: Discharge home Continue Medication List  As of 11/11/2011  1:34 PM   TAKE these medications      Indication    insulin glargine 100 UNIT/ML injection   Commonly known as: LANTUS   Inject 12 Units into the skin at bedtime. For control of blood sugar, however keeping sugar under control HELPS prevent blood vessel DESTRUCTION.       metFORMIN 750 MG 24 hr tablet   Commonly known as: GLUCOPHAGE-XR   Take 1 tablet (750 mg total) by mouth 2 (two) times daily with a meal. For control of blood sugar       multivitamin with minerals Tabs   Take 1 tablet by mouth daily. For nutritional supplementation.       thiamine 100 MG tablet   Take 1 tablet (100 mg total) by mouth daily. For nutritional supplementation.            Follow-up recommendations:  Activities: Resume typical activities Diet: Resume typical diet Other: Follow up with outpatient provider and report any side effects to out patient prescriber.  Rudie Sermons 11/11/2011, 1:15 PM

## 2011-11-11 NOTE — Progress Notes (Signed)
D: Patient reports sleep as fair. Slept 6.25 hours but did not feel that it "was good rest". States that he "constantly feels tired". Reports appetite improving, ate 50% of his breakfast, energy level low and ability to payattention improving.  He chose not to rate his depression or sense of hopelessness. Patient endorses sedation and agitation, denies SI, admits to light headedness and dizziness. Standing B/P 89/60, patient encouraged to increase fluids and to take care when rising and changing positions. (A) Patient encouraged to review coping skills and supported in his efforts to call and secure interviews at [facility] [facility]. Gatorade offered and patient provided with ice water. (R) Patient responsive to encouragement and attended AM group. Foster Simpson RN MS EdS 11/11/2011  9:10 AM

## 2011-11-11 NOTE — Progress Notes (Signed)
Patient resting quietly with eyes closed. Respirations even and unlabored. No distress noted, Q 15 minute checks continues to maintain safety.

## 2011-11-11 NOTE — Discharge Summary (Signed)
Physician Discharge Summary Note  Patient:  [name] is an 56 y.o., male MRN:  [id number] DOB:  [birth year] Patient phone:  [phone] (home)  Patient address:   [place] [address]   Date of Admission:  11/06/2011 Date of Discharge:   Discharge Diagnoses: Principal Problem:  *Alcohol dependence with acute alcoholic intoxication Active Problems:  Diabetes mellitus  Type II or unspecified type diabetes mellitus without mention of complication, uncontrolled  Axis Diagnosis:  AXIS I: Alcohol Abuse and Major Depression, Recurrent severe  AXIS II: Deferred  AXIS III:  Past Medical History   Diagnosis  Date   .  Hepatitis C    .  Diabetes mellitus    .  Chronic hyponatremia    .  Alcoholic    .  Alcoholic cirrhosis of liver    .  Thrombocytopenia     AXIS IV: other psychosocial or environmental problems  AXIS V: 61-70 mild symptoms  Level of Care:  OP  Hospital Course:   Reason for admission:alcohol detox can't afford motel room anyway and is afraid of withdrawal seizures. ETOH 343  History of Present Illness:  Reports increasing his use of alcohol the past few months due to relationship issues with GF. Here last September and denies other admissions since then. Wants to get into an [employer] at discharge.  Says he started a month ago to try to stop but couldn't.   While a patient in this hospital, Mr. Mathes received medication management for his alcohol detox. They were ordered and received Librium for alcohol detox. He also was adjusted on Sliding scale insulin and an increase in his glucophage.  They were also enrolled in group counseling sessions and activities, but has to be encouraged to participated.   Patient attended treatment team meeting this am and met with treatment team members. Pt symptoms, treatment plan and response to treatment discussed. Mr. Tippin endorsed that their symptoms have improved. Pt also stated that they are  stable for discharge. In other to maintain his sobriety and diabetes control, they will continue psychiatric care on outpatient basis. They will follow-up at an [facility] and at [facility] after he establishes a stable address.  He was to also follow-up with 90 meetings in 90 days and to include [place] in those meetings.  Upon discharge, patient adamantly denies suicidal, homicidal ideations, auditory, visual hallucinations and or delusional thinking. They left [facility] with all personal belongings in no apparent distress.   Consults:  Internal Medicine  Significant Diagnostic Studies:  labs: Platelets low at 61, sodium low at 130, Hemoglobin A1c elevated at 8.8, and elevated capilary blood sugars ranging from 214 to 468.  Discharge Vitals:   Blood pressure 89/60, pulse 96, temperature 97.6 F (36.4 C), temperature source Oral, resp. rate 16, height 6' (1.829 m), weight 77.111 kg (170 lb)..  Mental Status Exam: See Mental Status Examination and Suicide Risk Assessment completed by Attending Physician prior to discharge.  Discharge destination:  Home  Is patient on multiple antipsychotic therapies at discharge:  No  Has Patient had three or more failed trials of antipsychotic monotherapy by history: N/A Recommended Plan for Multiple Antipsychotic Therapies: N/A    Medication List  As of 11/11/2011  1:54 PM   TAKE these medications      Indication    insulin glargine 100 UNIT/ML injection   Commonly known as: LANTUS   Inject 12 Units into the skin at bedtime. For  control of blood sugar, however keeping sugar under control HELPS prevent blood vessel DESTRUCTION.       metFORMIN 750 MG 24 hr tablet   Commonly known as: GLUCOPHAGE-XR   Take 1 tablet (750 mg total) by mouth 2 (two) times daily with a meal. For control of blood sugar       multivitamin with minerals Tabs   Take 1 tablet by mouth daily. For nutritional supplementation.       thiamine 100 MG tablet    Take 1 tablet (100 mg total) by mouth daily. For nutritional supplementation.             Follow-up Information    Follow up with [facility] at  [phone] when you have a stable address so they can mail you a follow up appointment date and time..         Follow-up recommendations:   Activities: Resume typical activities Diet: Resume typical diet Other: Follow up with outpatient provider and report any side effects to out patient prescriber.  Comments:  Take all your medications as prescribed by your mental healthcare provider. Report any adverse effects and or reactions from your medicines to your outpatient provider promptly. Patient is instructed and cautioned to not engage in alcohol and or illegal drug use while on prescription medicines. In the event of worsening symptoms, patient is instructed to call the crisis hotline, 911 and or go to the nearest ED for appropriate evaluation and treatment of symptoms.  SignedGilford Rile, Desteni Piscopo 11/11/2011 1:54 PM

## 2011-11-11 NOTE — Consult Note (Addendum)
[facility] Consult Note  [name] CSN:[id number],[id number]   Patients out patient PCP is No primary provider on file. Consult requested in the Hospital by [facility], DO, On 11/11/2011  Reason for consult:  Evaluation and Management recommendations for hyponatremia, hyperglycemia, thrombocytopenia, liver disease  With History of - Principal Problem:  *Alcohol dependence with acute alcoholic intoxication Active Problems:  Diabetes mellitus  Type II or unspecified type diabetes mellitus without mention of complication, uncontrolled   Past Medical History  Diagnosis Date  . Hepatitis C   . Diabetes mellitus   . Chronic hyponatremia   . Alcoholic   . Alcoholic cirrhosis of liver   . Thrombocytopenia   . Hyperglycemia      History reviewed. No pertinent past surgical history.  History reviewed. No pertinent past surgical history.  HPI:-  [name] [id number] is a 56 y.o. male, and with a 40 year history of chronic alcoholism, type 2 diabetes mellitus on insulin, chronic hyponatremia and chronic thrombocytopenia who was admitted into behavioral health with acute alcohol detox and treatment.  The patient has been reportedly having elevated blood sugars in the 200-300 range.  He had been off his Lantus insulin for several days while under acute detox.  Now that he is eating and his acute detox has been treated he is having more hyperglycemia.  Also his sodium has dropped slightly.  His initial sodium was 132 now down to 1:30.  He's had some hyperglycemia with blood glucose readings in the 300 range.  He was restarted on Lantus insulin yesterday and started on metformin today.  The patient's hemoglobin A1c is 8.8%.  The patient reports that he normally takes his Lantus and metformin and has blood glucose readings in the 150-200 range.  Because of his chronic alcoholism he has not been caring for his diabetes very well.  He reports that when he  is diligent about diet and taking medications he has much better I seem to control.  He reports that his alcoholism has worsened over the past several months.  Review of Systems   Constitutional: positive for anorexia, fatigue, night sweats and weight loss Eyes: negative Ears, nose, mouth, throat, and face: positive for nasal congestion, snoring and sore mouth Respiratory: negative Cardiovascular: negative Gastrointestinal: positive for change in bowel habits, constipation, diarrhea and vomiting Integument/breast: negative Hematologic/lymphatic: negative Musculoskeletal:negative Neurological: negative Endocrine: positive for diabetic symptoms including blurry vision, increased fatigue, polydipsia, polyuria, skin dryness and weight loss Allergic/Immunologic: negative   Social History History  Substance Use Topics  . Smoking status: Current Everyday Smoker -- 1.0 packs/day for 40 years  . Smokeless tobacco: Not on file  . Alcohol Use: 9.0 oz/week    15 Cans of beer per week     Drank a 12 pack of beer today     Family History History reviewed. No pertinent family history.   Prior to Admission medications   Medication Sig Start Date End Date Taking? Authorizing Provider  insulin glargine (LANTUS) 100 UNIT/ML injection Inject 12 Units into the skin at bedtime. For control of blood sugar, however keeping sugar under control HELPS prevent blood vessel DESTRUCTION. 11/11/11   Darrol Jump, MD  metFORMIN (GLUCOPHAGE-XR) 750 MG 24 hr tablet Take 1 tablet (750 mg total) by mouth 2 (two) times daily with a meal. For control of blood sugar 11/11/11 11/10/12  Darrol Jump, MD  Multiple Vitamin (MULTIVITAMIN WITH MINERALS) TABS Take 1 tablet by mouth daily. For nutritional supplementation. 11/11/11   Christean Grief  Ofilia Neas, MD  thiamine 100 MG tablet Take 1 tablet (100 mg total) by mouth daily. For nutritional supplementation. 11/11/11 11/10/12  Darrol Jump, MD    Allergies  Allergen Reactions  .  Sulfa Antibiotics    Physical Exam No intake or output data in the 24 hours ending 11/11/11 1329 Blood pressure 89/60, pulse 96, temperature 97.6 F (36.4 C), temperature source Oral, resp. rate 16, height 6' (1.829 m), weight 77.111 kg (170 lb).  General appearance: alert, cooperative, appears older than stated age and no distress Head: Normocephalic, without obvious abnormality, atraumatic Eyes: negative Nose: Nares normal. Septum midline. Mucosa normal. No drainage or sinus tenderness., no discharge Throat: Dentition poor, dry mucous membranes Neck: no adenopathy, no carotid bruit, no JVD, supple, symmetrical, trachea midline and thyroid not enlarged, symmetric, no tenderness/mass/nodules Lungs: clear to auscultation bilaterally and normal percussion bilaterally Heart: regular rate and rhythm, S1, S2 normal, no murmur, click, rub or gallop Abdomen: soft, non-tender; bowel sounds normal; no masses,  no organomegaly Extremities: extremities normal, atraumatic, no cyanosis or edema Pulses: 2+ and symmetric Neurologic: Grossly normal  Data Review CBC w Diff:  Lab Results  Component Value Date   WBC 5.4 11/09/2011   HGB 14.9 11/09/2011   HCT 41.8 11/09/2011   PLT 61* 11/09/2011   LYMPHOPCT 54* 01/29/2011   MONOPCT 6 01/29/2011   EOSPCT 1 01/29/2011   BASOPCT 1 01/29/2011    CMP:  Lab Results  Component Value Date   NA 130* 11/09/2011   K 4.2 11/09/2011   CL 96 11/09/2011   CO2 28 11/09/2011   BUN 14 11/09/2011   CREATININE 0.54 11/09/2011   PROT 7.3 11/09/2011   ALBUMIN 3.3* 11/09/2011   BILITOT 0.6 11/09/2011   ALKPHOS 157* 11/09/2011   AST 31 11/09/2011   ALT 31 11/09/2011    Coagulation:  No results found for this basename: PROTIME, INR, PTT    Cardiac markers:  No results found for this basename: CKMB, TROPONINI, MYOGLOBIN   Impression *Alcohol dependence with acute alcoholic intoxication  Diabetes mellitus Type II or unspecified type diabetes mellitus without mention of complication,  uncontrolled Chronic hyponatremia Chronic thrombocytopenia Alcoholic liver disease Hepatitis C  Recommendations/Comments Change metformin from immediate release to metformin ER 750 mg po BID with meals, continue lantus, supplemental insulin, continue monitoring BS 4x per day.  Monitor sodium and potassium.  Make sure patient has primary care follow up at discharge for his diabetes care.  Fluid restriction 1.5 L per day.  Avoid Alcohol and recreational substances/drugs.  Monitor CBC and platelets.  Currently pt having no active bleeding.  Supplement MVI, folic acid and thiamine.  Fall precautions.  Pt needs dilated retinal exam / diabetes education / Probation officer / diabetic foot exam / Diabetes testing supplies: Meter, lancets, strips for ongoing diabetes care after discharge and can be arranged through primary care provider.    Thank you for the consult.     Murlean Iba, MD, CDE, FAAFP [facility] [place], [place]  Pager [phone]

## 2011-11-11 NOTE — Progress Notes (Signed)
RN Discharge Note: Patient discharged. Plans to call his ex-wife for a place to stay this evening. Patient given two bus passes, prescription for Lantus and AVS. Patient given phone number for Crisis Emergency and a current list of [place] groups. Belongings returned. Denies SI/HI.  Foster Simpson RN MS EdS 11/11/2011  2:52 PM

## 2011-11-11 NOTE — Progress Notes (Signed)
[facility] Case Management Discharge Plan:  Will you be returning to the same living situation after discharge: No. At discharge, do you have transportation home?:Yes,  bus pass Do you have the ability to pay for your medications:Yes,  sent with supply, scripts  Interagency Information:     Release of information consent forms completed and in the chart;  Patient's signature needed at discharge.  Patient to Follow up at:  Follow-up Information    Follow up with [facility] at  [phone] when you have a stable address so they can mail you a follow up appointment date and time..         Patient denies SI/HI:   Yes,  yes    Safety Planning and Suicide Prevention discussed:  Yes,  yes  Barrier to discharge identified:No.  Summary and Recommendations:   [name] 11/11/2011, 12:31 PM

## 2011-11-11 NOTE — Progress Notes (Signed)
Psychoeducational Group Note  Date:  11/11/2011 Time:  11:00  Group Topic/Focus:  Overcoming Stress:   The focus of this group is to define stress and help patients assess their triggers.  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 Pt told the group that if he can anticipate the stressor then he can handle it better.  Pt reported a leisure activity he can use to reduce stress is to play his guitar.  Pt appeared to understand "good" stressors" and "bad" stressors" in his life.  [name] 11/11/2011, 1:52 PM

## 2011-11-12 NOTE — Progress Notes (Signed)
Patient Discharge Instructions:  After Visit Summary (AVS):   Access to EMR:  11/12/2011 Psychiatric Admission Assessment Note:   Access to EMR:  11/12/2011 Suicide Risk Assessment - Discharge Assessment:   Access to EMR:  11/12/2011 Faxed/Sent to the Next [facility] Care provider:  11/12/2011  Records provided to [facility] via [facility]/Epic access.  [name], 11/12/2011, 10:27 AM

## 2011-12-17 ENCOUNTER — Encounter (HOSPITAL_COMMUNITY): Payer: Self-pay | Admitting: Emergency Medicine

## 2011-12-17 ENCOUNTER — Emergency Department (HOSPITAL_COMMUNITY)
Admission: EM | Admit: 2011-12-17 | Discharge: 2011-12-17 | Disposition: A | Discharge status: Home / Self Care | Payer: Self-pay | Attending: Emergency Medicine | Admitting: Emergency Medicine

## 2011-12-17 ENCOUNTER — Emergency Department (HOSPITAL_COMMUNITY): Payer: Self-pay

## 2011-12-17 DIAGNOSIS — Z882 Allergy status to sulfonamides status: Secondary | ICD-10-CM | POA: Insufficient documentation

## 2011-12-17 DIAGNOSIS — F1020 Alcohol dependence, uncomplicated: Secondary | ICD-10-CM | POA: Insufficient documentation

## 2011-12-17 DIAGNOSIS — B1920 Unspecified viral hepatitis C without hepatic coma: Secondary | ICD-10-CM | POA: Insufficient documentation

## 2011-12-17 DIAGNOSIS — Y92410 Unspecified street and highway as the place of occurrence of the external cause: Secondary | ICD-10-CM | POA: Insufficient documentation

## 2011-12-17 DIAGNOSIS — S022XXA Fracture of nasal bones, initial encounter for closed fracture: Secondary | ICD-10-CM | POA: Insufficient documentation

## 2011-12-17 DIAGNOSIS — Z794 Long term (current) use of insulin: Secondary | ICD-10-CM | POA: Insufficient documentation

## 2011-12-17 DIAGNOSIS — F17200 Nicotine dependence, unspecified, uncomplicated: Secondary | ICD-10-CM | POA: Insufficient documentation

## 2011-12-17 DIAGNOSIS — IMO0002 Reserved for concepts with insufficient information to code with codable children: Secondary | ICD-10-CM

## 2011-12-17 DIAGNOSIS — Z79899 Other long term (current) drug therapy: Secondary | ICD-10-CM | POA: Insufficient documentation

## 2011-12-17 DIAGNOSIS — E119 Type 2 diabetes mellitus without complications: Secondary | ICD-10-CM | POA: Insufficient documentation

## 2011-12-17 DIAGNOSIS — K7030 Alcoholic cirrhosis of liver without ascites: Secondary | ICD-10-CM | POA: Insufficient documentation

## 2011-12-17 MED ORDER — CEPHALEXIN 500 MG PO CAPS: 500.0000 mg | ORAL_CAPSULE | Freq: Four times a day (QID) | ORAL | Status: DC

## 2011-12-17 MED ORDER — OXYCODONE-ACETAMINOPHEN 5-325 MG PO TABS
1.0000 | ORAL_TABLET | Freq: Once | ORAL | Status: AC
  Administered 2011-12-17: 1 via ORAL
  Filled 2011-12-17: qty 1

## 2011-12-17 MED ORDER — HYDROCODONE-ACETAMINOPHEN 5-500 MG PO TABS: 1.0000 | ORAL_TABLET | Freq: Four times a day (QID) | ORAL | Status: DC | PRN

## 2011-12-17 MED ORDER — TRAMADOL HCL 50 MG PO TABS: 50.0000 mg | ORAL_TABLET | Freq: Four times a day (QID) | ORAL | Status: DC | PRN
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
## 2011-12-17 NOTE — ED Notes (Signed)
Pt verbalizes understanding

## 2011-12-17 NOTE — ED Provider Notes (Signed)
Medical screening examination/treatment/procedure(s) were performed by non-physician practitioner and as supervising physician I was immediately available for consultation/collaboration.  Barbara Cower, MD 12/17/11 [phone]

## 2011-12-20 ENCOUNTER — Emergency Department (HOSPITAL_COMMUNITY)
Admission: EM | Admit: 2011-12-20 | Discharge: 2011-12-21 | Disposition: A | Discharge status: Other Acute Inpatient Hospital | Payer: Self-pay | Attending: Emergency Medicine | Admitting: Emergency Medicine

## 2011-12-20 ENCOUNTER — Encounter (HOSPITAL_COMMUNITY): Payer: Self-pay | Admitting: Emergency Medicine

## 2011-12-20 DIAGNOSIS — E871 Hypo-osmolality and hyponatremia: Secondary | ICD-10-CM | POA: Insufficient documentation

## 2011-12-20 DIAGNOSIS — F1010 Alcohol abuse, uncomplicated: Secondary | ICD-10-CM | POA: Insufficient documentation

## 2011-12-20 DIAGNOSIS — F10929 Alcohol use, unspecified with intoxication, unspecified: Secondary | ICD-10-CM

## 2011-12-20 LAB — BASIC METABOLIC PANEL
CO2: 24 mEq/L (ref 19–32)
Chloride: 91 mEq/L — ABNORMAL LOW (ref 96–112)
Glucose, Bld: 117 mg/dL — ABNORMAL HIGH (ref 70–99)
Potassium: 3.7 mEq/L (ref 3.5–5.1)
Sodium: 128 mEq/L — ABNORMAL LOW (ref 135–145)

## 2011-12-20 LAB — CBC
HCT: 36.6 % — ABNORMAL LOW (ref 39.0–52.0)
Hemoglobin: 13.8 g/dL (ref 13.0–17.0)
MCH: 35.8 pg — ABNORMAL HIGH (ref 26.0–34.0)
MCV: 94.8 fL (ref 78.0–100.0)
Platelets: 63 10*3/uL — ABNORMAL LOW (ref 150–400)
RBC: 3.86 MIL/uL — ABNORMAL LOW (ref 4.22–5.81)
WBC: 5.9 10*3/uL (ref 4.0–10.5)

## 2011-12-20 LAB — ETHANOL: Alcohol, Ethyl (B): 348 mg/dL — ABNORMAL HIGH (ref 0–11)

## 2011-12-20 LAB — RAPID URINE DRUG SCREEN, HOSP PERFORMED
Amphetamines: NOT DETECTED
Opiates: NOT DETECTED

## 2011-12-20 MED ORDER — LORAZEPAM 1 MG PO TABS
1.0000 mg | ORAL_TABLET | Freq: Four times a day (QID) | ORAL | Status: DC | PRN
  Filled 2011-12-20: qty 2

## 2011-12-20 MED ORDER — VITAMIN B-1 100 MG PO TABS
100.0000 mg | ORAL_TABLET | Freq: Every day | ORAL | Status: DC
  Administered 2011-12-20 – 2011-12-21 (×2): 100 mg via ORAL
  Filled 2011-12-20 (×2): qty 1

## 2011-12-20 MED ORDER — FOLIC ACID 1 MG PO TABS
1.0000 mg | ORAL_TABLET | Freq: Every day | ORAL | Status: DC
  Administered 2011-12-20 – 2011-12-21 (×2): 1 mg via ORAL
  Filled 2011-12-20 (×2): qty 1

## 2011-12-20 MED ORDER — FOLIC ACID 1 MG PO TABS: 1.0000 mg | ORAL_TABLET | Freq: Once | ORAL | Status: DC

## 2011-12-20 MED ORDER — ADULT MULTIVITAMIN W/MINERALS CH
1.0000 | ORAL_TABLET | Freq: Every day | ORAL | Status: DC
  Administered 2011-12-20 – 2011-12-21 (×2): 1 via ORAL
  Filled 2011-12-20 (×2): qty 1

## 2011-12-20 MED ORDER — LORAZEPAM 1 MG PO TABS
0.0000 mg | ORAL_TABLET | Freq: Four times a day (QID) | ORAL | Status: DC
  Administered 2011-12-21 (×3): 1 mg via ORAL
  Administered 2011-12-21: 2 mg via ORAL
  Filled 2011-12-20 (×3): qty 1

## 2011-12-20 MED ORDER — LORAZEPAM 1 MG PO TABS: 0.0000 mg | ORAL_TABLET | Freq: Two times a day (BID) | ORAL | Status: DC

## 2011-12-20 MED ORDER — LORAZEPAM 2 MG/ML IJ SOLN: 1.0000 mg | Freq: Four times a day (QID) | INTRAMUSCULAR | Status: DC | PRN

## 2011-12-20 MED ORDER — VITAMIN B-1 100 MG PO TABS: 100.0000 mg | ORAL_TABLET | Freq: Once | ORAL | Status: DC

## 2011-12-20 MED ORDER — SODIUM CHLORIDE 0.9 % IV SOLN
INTRAVENOUS | Status: DC
  Administered 2011-12-20: 22:00:00 via INTRAVENOUS

## 2011-12-20 MED ORDER — THIAMINE HCL 100 MG/ML IJ SOLN: 100.0000 mg | Freq: Every day | INTRAMUSCULAR | Status: DC

## 2011-12-20 MED ORDER — LORAZEPAM 1 MG PO TABS
1.0000 mg | ORAL_TABLET | Freq: Once | ORAL | Status: AC
  Administered 2011-12-20: 1 mg via ORAL
  Filled 2011-12-20: qty 1

## 2011-12-20 NOTE — ED Notes (Signed)
Bed:[facility]ALA<BR> Expected date:<BR> Expected time:<BR> Means of arrival:<BR> Comments:<BR> Detox.

## 2011-12-20 NOTE — ED Notes (Signed)
Pt. has 1 bag of belongings located at nurses station

## 2011-12-20 NOTE — ED Provider Notes (Signed)
History     CSN: [id number]  Arrival date & time 12/20/11  2017   First MD Initiated Contact with Patient 12/20/11 2044      Chief Complaint  Patient presents with  . Alcohol Intoxication    HPI Pt was seen at 2050.  Per pt, c/o gradual onset and persistence of constant etoh abuse "for years."  Pt states he is here today requesting detox.  States his last detox was "somewhere" approx 1 month ago.  Endorses he "drank a lot" before coming to the ED for eval.  Denies SI, no HI, no abd pain, no CP/SOB.    Past Medical History  Diagnosis Date  . Hepatitis C   . Diabetes mellitus   . Chronic hyponatremia   . Alcoholic   . Alcoholic cirrhosis of liver   . Thrombocytopenia   . Hyperglycemia     History reviewed. No pertinent past surgical history.   History  Substance Use Topics  . Smoking status: Current Everyday Smoker -- 1.0 packs/day for 40 years  . Smokeless tobacco: Not on file  . Alcohol Use: 9.0 oz/week    15 Cans of beer per week     Drank a 12 pack of beer today    Review of Systems ROS: Statement: All systems negative except as marked or noted in the HPI; Constitutional: Negative for fever and chills. ; ; Eyes: Negative for eye pain, redness and discharge. ; ; ENMT: Negative for ear pain, hoarseness, nasal congestion, sinus pressure and sore throat. ; ; Cardiovascular: Negative for chest pain, palpitations, diaphoresis, dyspnea and peripheral edema. ; ; Respiratory: Negative for cough, wheezing and stridor. ; ; Gastrointestinal: Negative for nausea, vomiting, diarrhea, abdominal pain, blood in stool, hematemesis, jaundice and rectal bleeding. . ; ; Genitourinary: Negative for dysuria, flank pain and hematuria. ; ; Musculoskeletal: Negative for back pain and neck pain. Negative for swelling and trauma.; ; Skin: Negative for pruritus, rash, abrasions, blisters, bruising and skin lesion.; ; Neuro: Negative for headache, lightheadedness and neck stiffness. Negative for  weakness, altered level of consciousness , altered mental status, extremity weakness, paresthesias, involuntary movement, seizure and syncope.; Psych:  No SI, no SA, no HI, no hallucinations.      Allergies  Sulfa antibiotics and Librium  Home Medications   Current Outpatient Rx  Name Route Sig Dispense Refill  . INSULIN GLARGINE 100 UNIT/ML [place] SOLN Subcutaneous Inject 12 Units into the skin at bedtime. For control of blood sugar, however keeping sugar under control HELPS prevent blood vessel DESTRUCTION.    Marland Kitchen TRAMADOL HCL 50 MG PO TABS Oral Take 1 tablet (50 mg total) by mouth every 6 (six) hours as needed for pain. 15 tablet 0  . METFORMIN HCL ER 750 MG PO TB24 Oral Take 1 tablet (750 mg total) by mouth 2 (two) times daily with a meal. For control of blood sugar 60 tablet 0    BP 115/71  Pulse 90  Temp 98.3 F (36.8 C) (Oral)  Resp 20  SpO2 94%  Physical Exam 2055: Physical examination:  Nursing notes reviewed; Vital signs and O2 SAT reviewed;  Constitutional: Well developed, Well nourished, Well hydrated, In no acute distress; Head:  Normocephalic, +healing abrasions and bruising to face (pt states they are old); Eyes: EOMI, PERRL, No scleral icterus; ENMT: Mouth and pharynx normal, Mucous membranes moist; Neck: Supple, Full range of motion, No lymphadenopathy; Cardiovascular: Regular rate and rhythm, No murmur, rub, or gallop; Respiratory: Breath sounds clear & equal  bilaterally, No rales, rhonchi, wheezes.  Speaking full sentences with ease, Normal respiratory effort/excursion; Chest: Nontender, Movement normal; Abdomen: Soft, Nontender, Nondistended, Normal bowel sounds;; Extremities: Pulses normal, No tenderness, No edema, No calf edema or asymmetry.; Neuro: AA&Ox3, Major CN grossly intact.  Speech clear. No gross focal motor or sensory deficits in extremities.; Skin: Color normal, Warm, Dry.; Psych:  No SI.    ED Course  Procedures   MDM  MDM Reviewed: previous chart,  nursing note and vitals Interpretation: labs   Results for orders placed during the hospital encounter of 12/20/11  URINE RAPID DRUG SCREEN (HOSP PERFORMED)      Component Value Range   Opiates NONE DETECTED  NONE DETECTED   Cocaine NONE DETECTED  NONE DETECTED   Benzodiazepines NONE DETECTED  NONE DETECTED   Amphetamines NONE DETECTED  NONE DETECTED   Tetrahydrocannabinol NONE DETECTED  NONE DETECTED   Barbiturates NONE DETECTED  NONE DETECTED  ETHANOL      Component Value Range   Alcohol, Ethyl (B) 348 (*) 0 - 11 mg/dL  BASIC METABOLIC PANEL      Component Value Range   Sodium 128 (*) 135 - 145 mEq/L   Potassium 3.7  3.5 - 5.1 mEq/L   Chloride 91 (*) 96 - 112 mEq/L   CO2 24  19 - 32 mEq/L   Glucose, Bld 117 (*) 70 - 99 mg/dL   BUN 7  6 - 23 mg/dL   Creatinine, Ser 0.55  0.50 - 1.35 mg/dL   Calcium 8.7  8.4 - 10.5 mg/dL   GFR calc non Af Amer >90  >90 mL/min   GFR calc Af Amer >90  >90 mL/min  CBC      Component Value Range   WBC 5.9  4.0 - 10.5 K/uL   RBC 3.86 (*) 4.22 - 5.81 MIL/uL   Hemoglobin 13.8  13.0 - 17.0 g/dL   HCT 36.6 (*) 39.0 - 52.0 %   MCV 94.8  78.0 - 100.0 fL   MCH 35.8 (*) 26.0 - 34.0 pg   MCHC 36.1 (*) 30.0 - 36.0 g/dL   RDW 13.5  11.5 - 15.5 %   Platelets 63 (*) 150 - 400 K/uL   Results for BRAXTEN, MEMMER (MRN [id number]) as of 12/20/2011 22:59  Ref. Range 11/05/2011 05:22 11/07/2011 07:05 11/09/2011 06:25 12/20/2011 21:12  Platelets Latest Range: 150-400 K/uL 65 (L) 43 (L) 61 (L) 63 (L)     2145:  Will start IVF, ativan PO already given.  Platelets and Na per baseline. Needs re-eval after etoh is lower to see if he still wants etoh detox.   0030:  Sleeping soundly, NAD, resps easy. Sign out to Dr. Sharol Given.   Alfonzo Feller, DO 12/22/11 1523

## 2011-12-20 NOTE — ED Notes (Signed)
Pt. and belongings wanded by security

## 2011-12-20 NOTE — ED Notes (Signed)
Patient presents via [facility] for assistance with alcohol abuse.  Admits to drinking heavily before coming in tonight.  Denies any other health problems or c/o at present.

## 2011-12-20 NOTE — ED Notes (Signed)
Pt. In blue scrubs and red socks.

## 2011-12-21 ENCOUNTER — Encounter (HOSPITAL_COMMUNITY): Payer: Self-pay | Admitting: *Deleted

## 2011-12-21 ENCOUNTER — Inpatient Hospital Stay (HOSPITAL_COMMUNITY)
Admission: AD | Admit: 2011-12-21 | Discharge: 2011-12-25 | DRG: 897 | Disposition: A | Discharge status: Home / Self Care | Payer: Federal, State, Local not specified - Other | Source: Ambulatory Visit | Attending: Psychiatry | Admitting: Psychiatry

## 2011-12-21 DIAGNOSIS — Z79899 Other long term (current) drug therapy: Secondary | ICD-10-CM

## 2011-12-21 DIAGNOSIS — F10229 Alcohol dependence with intoxication, unspecified: Secondary | ICD-10-CM | POA: Diagnosis present

## 2011-12-21 DIAGNOSIS — B1920 Unspecified viral hepatitis C without hepatic coma: Secondary | ICD-10-CM | POA: Diagnosis present

## 2011-12-21 DIAGNOSIS — K7030 Alcoholic cirrhosis of liver without ascites: Secondary | ICD-10-CM | POA: Diagnosis present

## 2011-12-21 DIAGNOSIS — F10988 Alcohol use, unspecified with other alcohol-induced disorder: Secondary | ICD-10-CM | POA: Diagnosis present

## 2011-12-21 DIAGNOSIS — F10939 Alcohol use, unspecified with withdrawal, unspecified: Principal | ICD-10-CM | POA: Diagnosis not present

## 2011-12-21 DIAGNOSIS — E119 Type 2 diabetes mellitus without complications: Secondary | ICD-10-CM | POA: Diagnosis present

## 2011-12-21 DIAGNOSIS — Z794 Long term (current) use of insulin: Secondary | ICD-10-CM

## 2011-12-21 DIAGNOSIS — E871 Hypo-osmolality and hyponatremia: Secondary | ICD-10-CM | POA: Diagnosis present

## 2011-12-21 DIAGNOSIS — F10239 Alcohol dependence with withdrawal, unspecified: Principal | ICD-10-CM | POA: Diagnosis not present

## 2011-12-21 LAB — COMPREHENSIVE METABOLIC PANEL
ALT: 42 U/L (ref 0–53)
AST: 60 U/L — ABNORMAL HIGH (ref 0–37)
Albumin: 3.2 g/dL — ABNORMAL LOW (ref 3.5–5.2)
Alkaline Phosphatase: 112 U/L (ref 39–117)
Chloride: 95 mEq/L — ABNORMAL LOW (ref 96–112)
Potassium: 4.1 mEq/L (ref 3.5–5.1)
Sodium: 131 mEq/L — ABNORMAL LOW (ref 135–145)
Total Bilirubin: 1 mg/dL (ref 0.3–1.2)

## 2011-12-21 LAB — GLUCOSE, CAPILLARY: Glucose-Capillary: 174 mg/dL — ABNORMAL HIGH (ref 70–99)

## 2011-12-21 MED ORDER — NICOTINE 21 MG/24HR TD PT24
21.0000 mg | MEDICATED_PATCH | Freq: Every day | TRANSDERMAL | Status: DC
  Filled 2011-12-21 (×3): qty 1

## 2011-12-21 MED ORDER — MAGNESIUM HYDROXIDE 400 MG/5ML PO SUSP: 30.0000 mL | Freq: Every day | ORAL | Status: DC | PRN

## 2011-12-21 MED ORDER — METFORMIN HCL ER 750 MG PO TB24
750.0000 mg | ORAL_TABLET | Freq: Two times a day (BID) | ORAL | Status: DC
  Administered 2011-12-21 (×2): 750 mg via ORAL
  Filled 2011-12-21 (×3): qty 1

## 2011-12-21 MED ORDER — LORAZEPAM 1 MG PO TABS
1.0000 mg | ORAL_TABLET | Freq: Four times a day (QID) | ORAL | Status: AC
  Administered 2011-12-22 (×4): 1 mg via ORAL
  Filled 2011-12-21 (×2): qty 1

## 2011-12-21 MED ORDER — LORAZEPAM 1 MG PO TABS: 1.0000 mg | ORAL_TABLET | Freq: Every day | ORAL | Status: DC

## 2011-12-21 MED ORDER — LORAZEPAM 1 MG PO TABS
1.0000 mg | ORAL_TABLET | Freq: Three times a day (TID) | ORAL | Status: AC
  Administered 2011-12-23 – 2011-12-24 (×6): 1 mg via ORAL
  Filled 2011-12-21 (×5): qty 1

## 2011-12-21 MED ORDER — HYDROXYZINE HCL 25 MG PO TABS
25.0000 mg | ORAL_TABLET | Freq: Four times a day (QID) | ORAL | Status: AC | PRN
  Administered 2011-12-22 – 2011-12-24 (×3): 25 mg via ORAL

## 2011-12-21 MED ORDER — INSULIN GLARGINE 100 UNIT/ML ~~LOC~~ SOLN: 12.0000 [IU] | Freq: Every day | SUBCUTANEOUS | Status: DC

## 2011-12-21 MED ORDER — ACETAMINOPHEN 325 MG PO TABS: 650.0000 mg | ORAL_TABLET | Freq: Four times a day (QID) | ORAL | Status: DC | PRN

## 2011-12-21 MED ORDER — ADULT MULTIVITAMIN W/MINERALS CH
1.0000 | ORAL_TABLET | Freq: Every day | ORAL | Status: DC
  Administered 2011-12-22 – 2011-12-25 (×4): 1 via ORAL
  Filled 2011-12-21 (×6): qty 1

## 2011-12-21 MED ORDER — ACETAMINOPHEN 325 MG PO TABS
650.0000 mg | ORAL_TABLET | Freq: Four times a day (QID) | ORAL | Status: DC | PRN
  Administered 2011-12-22 – 2011-12-23 (×2): 650 mg via ORAL

## 2011-12-21 MED ORDER — LORAZEPAM 1 MG PO TABS
1.0000 mg | ORAL_TABLET | Freq: Two times a day (BID) | ORAL | Status: DC
  Filled 2011-12-21: qty 1

## 2011-12-21 MED ORDER — LOPERAMIDE HCL 2 MG PO CAPS: 2.0000 mg | ORAL_CAPSULE | ORAL | Status: AC | PRN

## 2011-12-21 MED ORDER — INSULIN GLARGINE 100 UNIT/ML ~~LOC~~ SOLN
12.0000 [IU] | Freq: Every day | SUBCUTANEOUS | Status: DC
  Administered 2011-12-21 – 2011-12-24 (×4): 12 [IU] via SUBCUTANEOUS

## 2011-12-21 MED ORDER — LORAZEPAM 1 MG PO TABS
1.0000 mg | ORAL_TABLET | Freq: Four times a day (QID) | ORAL | Status: DC | PRN
  Administered 2011-12-22 – 2011-12-25 (×4): 1 mg via ORAL
  Filled 2011-12-21 (×7): qty 1

## 2011-12-21 MED ORDER — METFORMIN HCL ER 750 MG PO TB24
750.0000 mg | ORAL_TABLET | Freq: Two times a day (BID) | ORAL | Status: DC
  Administered 2011-12-22 – 2011-12-25 (×7): 750 mg via ORAL
  Filled 2011-12-21 (×4): qty 1
  Filled 2011-12-21 (×4): qty 28
  Filled 2011-12-21 (×5): qty 1

## 2011-12-21 MED ORDER — THIAMINE HCL 100 MG/ML IJ SOLN: 100.0000 mg | Freq: Once | INTRAMUSCULAR | Status: DC

## 2011-12-21 MED ORDER — ALUM & MAG HYDROXIDE-SIMETH 200-200-20 MG/5ML PO SUSP: 30.0000 mL | ORAL | Status: DC | PRN

## 2011-12-21 MED ORDER — LORAZEPAM 1 MG PO TABS
1.0000 mg | ORAL_TABLET | ORAL | Status: AC
  Administered 2011-12-21: 1 mg via ORAL
  Filled 2011-12-21: qty 1

## 2011-12-21 MED ORDER — VITAMIN B-1 100 MG PO TABS
100.0000 mg | ORAL_TABLET | Freq: Every day | ORAL | Status: DC
  Administered 2011-12-22 – 2011-12-25 (×4): 100 mg via ORAL
  Filled 2011-12-21 (×6): qty 1

## 2011-12-21 MED ORDER — IBUPROFEN 200 MG PO TABS
600.0000 mg | ORAL_TABLET | Freq: Three times a day (TID) | ORAL | Status: DC | PRN
  Filled 2011-12-21: qty 3

## 2011-12-21 NOTE — Progress Notes (Signed)
Per Lyda Jester at [facility], pt has not been accepted. Pt continues to pend disposition at [facility] and [facility].

## 2011-12-21 NOTE — BH Assessment (Signed)
[facility] Assessment Progress Note      Pt has been accepted to Dr. Dell Ponto, bed 406.01.  All paperwork completed and faxed to appropriate parties.  Dr. Ashok Cordia and nursing staff notified and agreeable with disposition and pending transfer.

## 2011-12-21 NOTE — ED Provider Notes (Signed)
Pt alert, content, nad. Vitals normal.  Pt continued on home diabetes meds (lantus and glucophage - dose confirmed from pt and recent d/c summary). Act eval/placement pending.     Mirna Mires, MD 12/21/11 1055

## 2011-12-21 NOTE — ED Notes (Signed)
Patient belongings placed in locker 27. 1 bag.

## 2011-12-21 NOTE — BH Assessment (Signed)
Assessment Note   [name] is an 56 y.o. male. PT PRESENTS INTOXICATED & REQUESTING DETOX FROM ETOH. PT ADMITS DRINKING 6 (40OZ) OR 12 PKS BEER DAILY. PT DENIES ANY IDEATION & IS ABLE TO CONTRACT FOR SAFETY. PT HAS REQUESTED THAT HE GO TO A PLACE THAT WILL GIVE HIM ATIVAN TO HELP WITH WITHDRAWAL. CLINICIAN EXPLAIN THAT CONDITION COULD NOT BE PROMISED & THAT WAS UP TO THE FACILITY. PT IS ABLE TO CONTRACT FOR SAFETY. PT WAS REFERRED TO [facility], [facility] [place]; PENDING DISPOSITION.  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 Hepatitis C   . Diabetes mellitus   . Chronic hyponatremia   . Alcoholic   . Alcoholic cirrhosis of liver   . Thrombocytopenia   . Hyperglycemia    Axis IV: housing problems and problems with primary support group Axis V: 41-50 serious symptoms  Past Medical History:  Past Medical History  Diagnosis Date  . Hepatitis C   . Diabetes mellitus   . Chronic hyponatremia   . Alcoholic   . Alcoholic cirrhosis of liver   . Thrombocytopenia   . Hyperglycemia     History reviewed. No pertinent past surgical history.  Family History: No family history on file.  Social History:  reports that he has been smoking.  He does not have any smokeless tobacco history on file. He reports that he drinks about 9 ounces of alcohol per week. He reports that he does not use illicit drugs.  Additional Social History:  Alcohol / Drug Use History of alcohol / drug use?: Yes Longest period of sobriety (when/how long): 6 YRS Substance #1 Name of Substance 1: ETOH 1 - Age of First Use: 14 1 - Amount (size/oz): 12 PK DAILY OR 6 (40OZ) 1 - Frequency: DAILY 1 - Duration: ON GOING 1 - Last Use / Amount: 12/20/11  CIWA: CIWA-Ar BP: 116/63 mmHg Pulse Rate: 77  Nausea and Vomiting: no nausea and no vomiting Tactile Disturbances: none Tremor: not visible, but can be felt fingertip to fingertip Auditory  Disturbances: not present Paroxysmal Sweats: no sweat visible Visual Disturbances: not present Anxiety: three Headache, Fullness in Head: none present Agitation: somewhat more than normal activity Orientation and Clouding of Sensorium: oriented and can do serial additions CIWA-Ar Total: 5  COWS:    Allergies:  Allergies  Allergen Reactions  . Sulfa Antibiotics   . Librium (Chlordiazepoxide Hcl) Anxiety    Home Medications:  (Not in a hospital admission)  OB/GYN Status:  No LMP for male patient.  General Assessment Data Location of Assessment: [facility] ED ACT Assessment: Yes Living Arrangements: Other (Comment) (TRANSISTION BETWEEN 2 PLACES) Can pt return to current living arrangement?: Yes Admission Status: Voluntary Is patient capable of signing voluntary admission?: Yes Transfer from: [facility] Referral Source: Self/Family/Friend     Risk to self Suicidal Ideation: No Suicidal Intent: No Is patient at risk for suicide?: No Suicidal Plan?: No Access to Means: No What has been your use of drugs/alcohol within the last 12 months?: PT ADMIITS TO ABUSING ETOH 5 - 6 (40OZ) DAILY Previous Attempts/Gestures: No How many times?: 0  Other Self Harm Risks: NA Triggers for Past Attempts: None known Intentional Self Injurious Behavior: None Family Suicide History: No Recent stressful life event(s): Financial Problems Persecutory voices/beliefs?: No Depression: No Depression Symptoms: Feeling angry/irritable;Loss of interest in usual pleasures Substance abuse history and/or treatment for substance abuse?: No  Suicide prevention information given to non-admitted patients: Not applicable  Risk to Others Homicidal Ideation: No Thoughts of Harm to Others: No Current Homicidal Intent: No Current Homicidal Plan: No Access to Homicidal Means: No Identified Victim: NA History of harm to others?: No Assessment of Violence: None Noted Violent Behavior Description: ANXIOUS,  COOPERATIVE Does patient have access to weapons?: No Criminal Charges Pending?: No Does patient have a court date: No  Psychosis Hallucinations: None noted Delusions: None noted  Mental Status Report Appear/Hygiene: Disheveled;Body odor Eye Contact: Poor Motor Activity: Freedom of movement Speech: Logical/coherent Level of Consciousness: Alert Mood: Anxious Affect: Anxious Anxiety Level: None Thought Processes: Coherent;Relevant Judgement: Impaired Orientation: Person;Place;Time;Situation Obsessive Compulsive Thoughts/Behaviors: None  Cognitive Functioning Concentration: Decreased Memory: Recent Intact;Remote Intact IQ: Average Insight: Fair Impulse Control: Fair Appetite: Fair Weight Loss: 0  Weight Gain: 0  Sleep: No Change Total Hours of Sleep: 7  Vegetative Symptoms: None  ADLScreening [facility]) Patient's cognitive ability adequate to safely complete daily activities?: Yes Patient able to express need for assistance with ADLs?: Yes Independently performs ADLs?: Yes  Abuse/Neglect [facility]) Physical Abuse: Denies Verbal Abuse: Denies Sexual Abuse: Denies  Prior Inpatient Therapy Prior Inpatient Therapy: Yes Prior Therapy Dates: 2012, 2013 Prior Therapy Facilty/Provider(s): [facility], [facility], [facility] Reason for Treatment: DETOX  Prior Outpatient Therapy Prior Outpatient Therapy: No Prior Therapy Dates: NA Prior Therapy Facilty/Provider(s): NA Reason for Treatment: NA  ADL Screening (condition at time of admission) Patient's cognitive ability adequate to safely complete daily activities?: Yes Patient able to express need for assistance with ADLs?: Yes Independently performs ADLs?: Yes       Abuse/Neglect Assessment (Assessment to be complete while patient is alone) Physical Abuse: Denies Verbal Abuse: Denies Sexual Abuse: Denies Values / Beliefs Cultural Requests During Hospitalization: None Spiritual Requests During Hospitalization:  None        Additional Information 1:1 In Past 12 Months?: No CIRT Risk: No Elopement Risk: No Does patient have medical clearance?: Yes     Disposition:  Disposition Disposition of Patient: Inpatient treatment program;Referred to [facility] Righter, [place]) Type of inpatient treatment program: Adult  On Site Evaluation by:   Reviewed with Physician:     Blenda Nicely 12/21/2011 10:04 AM

## 2011-12-21 NOTE — ED Notes (Signed)
Report called to behavioral health.
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
## 2011-12-21 NOTE — BHH Counselor (Addendum)
Per Curt Bears A, pt has been accepted to [facility] for treatment.  Dr. Roseanne Reno) [phone]

## 2011-12-21 NOTE — Tx Team (Signed)
Initial Interdisciplinary Treatment Plan  PATIENT STRENGTHS: (choose at least two) Ability for insight Active sense of humor Average or above average intelligence Capable of independent living Communication skills General fund of knowledge Motivation for treatment/growth Physical Health Religious Affiliation  PATIENT STRESSORS: Financial difficulties Legal issue Substance abuse   PROBLEM LIST: Problem List/Patient Goals Date to be addressed Date deferred Reason deferred Estimated date of resolution  "I just wanna detox" 12/21/11           Increased risk for suicide 12/21/11     Substance abuse 12/21/11                                    DISCHARGE CRITERIA:  Ability to meet basic life and health needs Adequate post-discharge living arrangements Improved stabilization in mood, thinking, and/or behavior Medical problems require only outpatient monitoring Motivation to continue treatment in a less acute level of care Need for constant or close observation no longer present Reduction of life-threatening or endangering symptoms to within safe limits Safe-care adequate arrangements made Verbal commitment to aftercare and medication compliance Withdrawal symptoms are absent or subacute and managed without 24-hour nursing intervention  PRELIMINARY DISCHARGE PLAN: Attend 12-step recovery group Participate in family therapy Placement in alternative living arrangements  PATIENT/FAMIILY INVOLVEMENT: This treatment plan has been presented to and reviewed with the patient, [name], and/or family member.  The patient and family have been given the opportunity to ask questions and make suggestions.  Wynonia Hazard [facility] 12/21/2011, 10:56 PM

## 2011-12-21 NOTE — Progress Notes (Signed)
After gathering further information from the pt, he is unable to seek treatment at [facility] due to not having a 14 day supply of medication, which is a requirement at [facility]. Pt is pending [place] at this time. RN has been notified that the [facility] at [facility], Randall Hiss, has requested a CMET for liver functioning before the pt can be reviewed for disposition.

## 2011-12-22 DIAGNOSIS — F1020 Alcohol dependence, uncomplicated: Secondary | ICD-10-CM

## 2011-12-22 LAB — GLUCOSE, CAPILLARY
Glucose-Capillary: 158 mg/dL — ABNORMAL HIGH (ref 70–99)
Glucose-Capillary: 155 mg/dL — ABNORMAL HIGH (ref 70–99)

## 2011-12-22 MED ORDER — CEPHALEXIN 500 MG PO CAPS
500.0000 mg | ORAL_CAPSULE | ORAL | Status: DC
  Administered 2011-12-22 – 2011-12-25 (×6): 500 mg via ORAL
  Filled 2011-12-22 (×3): qty 1
  Filled 2011-12-22: qty 9
  Filled 2011-12-22 (×2): qty 1
  Filled 2011-12-22: qty 9
  Filled 2011-12-22: qty 1
  Filled 2011-12-22: qty 9
  Filled 2011-12-22 (×2): qty 1
  Filled 2011-12-22: qty 9
  Filled 2011-12-22: qty 1
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
## 2011-12-22 NOTE — Progress Notes (Signed)
[facility] Group Notes:  (Counselor/Nursing/MHT/Case Management/Adjunct)     Type of Therapy:  Group Therapy from 1:15 to 2:30PM  Participation Level:  Did Not Attend  [name] 12/22/2011 4:03 PM

## 2011-12-22 NOTE — Progress Notes (Signed)
Psychoeducational Group Note  Date:  12/22/2011 Time:  1100  Group Topic/Focus:  Crisis Planning:   The purpose of this group is to help patients create a crisis plan for use upon discharge or in the future, as needed.  Participation Level: Did Not Attend  Participation Quality:  Not Applicable  Affect:  Not Applicable  Cognitive:  Not Applicable  Insight:  Not Applicable  Engagement in Group: Not Applicable  Additional Comments:  Pt stated that he did not feel well enough to attend Group.  [name] 12/22/2011, 6:47 PM

## 2011-12-22 NOTE — Treatment Plan (Signed)
Interdisciplinary Treatment Plan Update (Adult)  Date: 12/22/2011  Time Reviewed: 12:59 PM   Progress in Treatment: Attending groups: No Participating in groups: No Taking medication as prescribed: Yes Tolerating medication: Yes   Family/Significant other contact made:  No Patient understands diagnosis:  Yes  As evidenced by asking for help with alcohol detox Discussing patient identified problems/goals with staff:  Yes  See below Medical problems stabilized or resolved:  Yes Denies suicidal/homicidal ideation: Yes  In my interaction with him Issues/concerns per patient self-inventory:  Not filled out Other:  New problem(s) identified: N/A  Reason for Continuation of Hospitalization: Medication stabilization Withdrawal symptoms  Interventions implemented related to continuation of hospitalization:  Restart previous meds  Librium taper  Encourage goup attendance and participation  Additional comments:  Estimated length of stay: 2-3 days  Discharge Plan:see below  New goal(s): N/A  Review of initial/current patient goals per problem list:   1.  Goal(s):Eliminate SI  Met:  Yes  Target date:8/14  As evidenced GH:WEXH report  2.  Goal (s):Identify comprehensive sobriety plan  Met:  Yes  Target date:8/14  As evidenced BZ:JIRCVE he will live in a half way house, attend AA mtgs  3.  Goal(s):Safely detox from alcohol  Met:  No  Target date:8/17  As evidenced LF:YBOF score of 0, stable vitals  4.  Goal(s):  Met:  No  Target date:  As evidenced by:  Attendees: Patient:     Family:     Physician:  Cheryll Cockayne 12/22/2011 12:59 PM   Nursing:    12/22/2011 12:59 PM   Case Manager:  Ripley Fraise, [place] 12/22/2011 12:59 PM   Counselor:   12/22/2011 12:59 PM   Other:     Other:     Other:     Other:      Scribe for Treatment Team:   Trish Mage, 12/22/2011 12:59 PM

## 2011-12-22 NOTE — Discharge Planning (Signed)
[name] did not attend AM group.  Found him in bed at noon, awake with lights out.  Said he felt bad from alcohol detox.  Last time he left here he went to halfway house.  Worked for [employer].  Got some money and found his own boarding house.  Lost job.  Relapsed.  Plans to go to a different half way house [run by [employer]] and find a job painting again.  "My reign here will be short."

## 2011-12-22 NOTE — Progress Notes (Signed)
[facility] Group Notes:  (Counselor/Nursing/MHT/Case Management/Adjunct)  12/22/2011 8:44 PM  Type of Therapy:  AA Meeting  Participation Level:  Did Not Attend  Participation Quality:  Did not attend  Affect:  Appropriate  Cognitive:  Appropriate  Insight:  Did not attend  Engagement in Group:  Did not attend  Engagement in Therapy:  Did not attend  Modes of Intervention:  Problem-solving and Support  Summary of Progress/Problems:   Dene Gentry 12/22/2011, 8:44 PMThe focus of this group is to help patients review their daily goal of treatment and discuss progress on daily workbooks.
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
## 2011-12-22 NOTE — Progress Notes (Addendum)
D:  Pt. In bed this am.  [name] he didn't sleep well last night.  Said he had nightmares last night.    Denies SI/HI.  Didn't eat breakfast this am but did come and get am medications and went right back to bed.  RN encouraged him to attend groups today. He has a flat affect/depressed mood.   Has not completed self inventory.   A:  RN offered support and encouragement. RN explained that I would be available for him if he had questions or concerns today.    Given medications as prescribed.  R:  Remains on q 15 minute checks for safety.  Will continue to monitor.
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
## 2011-12-23 DIAGNOSIS — F10229 Alcohol dependence with intoxication, unspecified: Secondary | ICD-10-CM

## 2011-12-23 LAB — GLUCOSE, CAPILLARY
Glucose-Capillary: 125 mg/dL — ABNORMAL HIGH (ref 70–99)
Glucose-Capillary: 144 mg/dL — ABNORMAL HIGH (ref 70–99)
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
## 2011-12-23 NOTE — Progress Notes (Signed)
Patient ID: [name], male   DOB: [birth year], 55 y.o.   MRN: [id number]  D:  Pt was pleasant and cooperative. Pt was more open with the writer than the previous day. When asked to describe his day, pt stated, "Up and down, and up and down every 20 min. Something to do or somebody wants me for something. Asking me the same questions.   A:  Encouragement and support was offered. 15 min checks were continued.   R: Pt remains safe.

## 2011-12-23 NOTE — Progress Notes (Signed)
Patient's self inventory sheet, patient has poor sleep, improving appetite, low energy level, improving attention.   Denied depression and hopelessness.   Stated he has experienced tremors, diarrhea, craving, agitation.  Denied SI.   In past 24 hours, has experienced dizziness, headaches, blurred vision.   Does not want to have pain.  Worst pain #6.  Plans to not drink after discharge.  No questions for staff.   No discharge plans.   No problems taking meds after discharge. Patient's CBG this morning was 125.   Patient took meds without difficulty and stated he was feeling better this morning.   Denied SI and HI.   Denied A/V hallucinations.   Denied pain.  Encouraged patient to attend all group activities today.   Patient has been cooperative and pleasant.

## 2011-12-23 NOTE — H&P (Signed)
Read and reviewed. Pt see today by this Probation officer.

## 2011-12-23 NOTE — Progress Notes (Signed)
Psychoeducational Group Note  Date:  12/23/2011 Time:  1100  Group Topic/Focus:  Overcoming Stress:   The focus of this group is to define stress and help patients assess their triggers.  Participation Level: Did Not Attend  Participation Quality:  Not Applicable  Affect:  Not Applicable  Cognitive:  Not Applicable  Insight:  Not Applicable  Engagement in Group: Not Applicable  Additional Comments:  Pt remained in his room during group and did not attend.  Elenor Legato 12/23/2011, 12:15 PM
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
## 2011-12-23 NOTE — Progress Notes (Signed)
12/23/2011         Time: 1500      Group Topic/Focus: The focus of this group is on enhancing the patient's understanding of leisure, barriers to leisure, and the importance of engaging in positive leisure activities upon discharge for improved total health.  Participation Level: Minimal  Participation Quality: Resistant  Affect: Blunted  Cognitive: Oriented   Additional Comments: Patient came to group, but didn't participate, despite encouragement. Patient got up prior to group being over, said "thank you," and walked out.   [name] 12/23/2011 3:51 PM

## 2011-12-24 LAB — COMPREHENSIVE METABOLIC PANEL
ALT: 39 U/L (ref 0–53)
Calcium: 10 mg/dL (ref 8.4–10.5)
Creatinine, Ser: 0.55 mg/dL (ref 0.50–1.35)
GFR calc Af Amer: >90 mL/min (ref >90)
Glucose, Bld: 144 mg/dL — ABNORMAL HIGH (ref 70–99)
Sodium: 129 mEq/L — ABNORMAL LOW (ref 135–145)
Total Protein: 8 g/dL (ref 6.0–8.3)

## 2011-12-24 LAB — GLUCOSE, CAPILLARY: Glucose-Capillary: 151 mg/dL — ABNORMAL HIGH (ref 70–99)

## 2011-12-24 MED ORDER — TRAZODONE HCL 50 MG PO TABS
50.0000 mg | ORAL_TABLET | Freq: Every evening | ORAL | Status: DC | PRN
  Administered 2011-12-24 – 2011-12-25 (×2): 50 mg via ORAL
  Filled 2011-12-24: qty 1
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
## 2011-12-24 NOTE — Progress Notes (Signed)
Pt did attend the evening Karaoke group this but did not participate due to withdrawal symptoms.

## 2011-12-24 NOTE — Progress Notes (Signed)
Psychoeducational Group Note  Date:  12/24/2011 Time:  2000  Group Topic/Focus:  AA  Participation Level:  Minimal  Participation Quality:  Appropriate  Affect:  Appropriate  Cognitive:  Alert  Insight:  Good  Engagement in Group:  Good  Additional Comments:    Sephora Boyar R 12/24/2011, 11:55 PM

## 2011-12-24 NOTE — Progress Notes (Signed)
Psychoeducational Group Note  Date:  12/24/2011 Time:  1000  Group Topic/Focus:  Relapse Prevention Planning:   The focus of this group is to define relapse and discuss the need for planning to combat relapse.  Participation Level:  Did Not Attend  Participation Quality:  Inattentive  Affect:  Flat  Cognitive:  Alert, Appropriate and Oriented  Insight:  None  Engagement in Group:  None  Additional Comments:  Pt did not attend morning Psychoeducational group despite staff notifying pt of group.  Alferd Apa 12/24/2011, 12:44 PM

## 2011-12-24 NOTE — Progress Notes (Signed)
Removed wallet for phone #'s number [phone]. Observed patient go through contents. Witnessed by Lanny Hurst in security return wallet.

## 2011-12-24 NOTE — Progress Notes (Signed)
[facility] MD Progress Note  12/24/2011 4:32 PM  S/O: Patient seen and evaluated. Chart reviewed. Patient stated that his mood was "ok". His affect was mood congruent and slightly irritable/anxious. He denied any current thoughts of self injurious behavior, suicidal ideation or homicidal ideation. He denied any significant depressive signs or symptoms at this time. There were no auditory or visual hallucinations, paranoia, delusional thought processes, or mania noted.  Thought process was linear and goal directed.  No psychomotor agitation or retardation was noted. His speech was normal rate, tone and volume. Eye contact was good. Judgment and insight are fair.  Patient has been up and engaged on the unit.  No acute safety concerns reported from team. Pt requesting d/c on Sat or Sunday.  Does not want residential Tx.  Sleep:  Number of Hours: 4.25    Vital Signs:Blood pressure 110/74, pulse 94, temperature 96.7 F (35.9 C), temperature source Oral, resp. rate 16, height 5' 11" (1.803 m), weight 76.658 kg (169 lb).  Current Medications:    . cephALEXin  500 mg Oral BH-qamhs  . insulin glargine  12 Units Subcutaneous QHS  . LORazepam  1 mg Oral TID  . LORazepam  1 mg Oral Q12H  . LORazepam  1 mg Oral Daily  . metFORMIN  750 mg Oral BID WC  . multivitamin with minerals  1 tablet Oral Daily  . thiamine  100 mg Intramuscular Once  . thiamine  100 mg Oral Daily    Lab Results:  Results for orders placed during the hospital encounter of 12/21/11 (from the past 48 hour(s))  GLUCOSE, CAPILLARY     Status: Abnormal   Collection Time   12/22/11  5:13 PM      Component Value Range Comment   Glucose-Capillary 243 (*) 70 - 99 mg/dL    Comment 1 Notify RN     GLUCOSE, CAPILLARY     Status: Abnormal   Collection Time   12/22/11  8:21 PM      Component Value Range Comment   Glucose-Capillary 155 (*) 70 - 99 mg/dL   GLUCOSE, CAPILLARY     Status: Abnormal   Collection Time   12/23/11  6:04 AM   Component Value Range Comment   Glucose-Capillary 125 (*) 70 - 99 mg/dL   GLUCOSE, CAPILLARY     Status: Abnormal   Collection Time   12/23/11 12:08 PM      Component Value Range Comment   Glucose-Capillary 101 (*) 70 - 99 mg/dL   GLUCOSE, CAPILLARY     Status: Abnormal   Collection Time   12/23/11  4:51 PM      Component Value Range Comment   Glucose-Capillary 144 (*) 70 - 99 mg/dL   GLUCOSE, CAPILLARY     Status: Abnormal   Collection Time   12/23/11  9:44 PM      Component Value Range Comment   Glucose-Capillary 188 (*) 70 - 99 mg/dL    Comment 1 Notify RN      Comment 2 Documented in Chart     GLUCOSE, CAPILLARY     Status: Abnormal   Collection Time   12/24/11  5:57 AM      Component Value Range Comment   Glucose-Capillary 151 (*) 70 - 99 mg/dL   COMPREHENSIVE METABOLIC PANEL     Status: Abnormal   Collection Time   12/24/11  6:24 AM      Component Value Range Comment   Sodium 129 (*) 135 - 145  mEq/L    Potassium 3.7  3.5 - 5.1 mEq/L    Chloride 93 (*) 96 - 112 mEq/L    CO2 26  19 - 32 mEq/L    Glucose, Bld 144 (*) 70 - 99 mg/dL    BUN 14  6 - 23 mg/dL    Creatinine, Ser 0.55  0.50 - 1.35 mg/dL    Calcium 10.0  8.4 - 10.5 mg/dL    Total Protein 8.0  6.0 - 8.3 g/dL    Albumin 3.8  3.5 - 5.2 g/dL    AST 41 (*) 0 - 37 U/L    ALT 39  0 - 53 U/L    Alkaline Phosphatase 129 (*) 39 - 117 U/L    Total Bilirubin 1.0  0.3 - 1.2 mg/dL    GFR calc non Af Amer >90  >90 mL/min    GFR calc Af Amer >90  >90 mL/min     Physical Findings: CIWA:  CIWA-Ar Total: 6  COWS:  COWS Total Score: 0   A/P:  Alcohol Use & W/D Disorder; SIMD; Nasal Fracture; DM; HepC; Hyponatremia  Repeat CMP still showing stable hyponatremia.  Continue current meds and lorazepam taper.  Medication education completed.  Pt also requested change in metformin to 500 or 1028m per $4 formulary.  Pros, cons, risks, potential side effects and benefits were discussed with pt.  Pt agreeable with the plan.  See orders.   Discussed with team.  ELOS: 1-2 days.  Pt already requesting discharge for this weekend. IM Consult pending.  Lastly, pt does not want to start an SSRI for any underlying anxiety and depressive s/s.  KCheryll Cockayne8/16/2013, 4:32 PM

## 2011-12-24 NOTE — Progress Notes (Signed)
[facility] Case Management Discharge Plan:  Will you be returning to the same living situation after discharge: No. At discharge, do you have transportation home?:Yes,  bus pass Do you have the ability to pay for your medications:Yes,  $4.00 [employer] formulary  Interagency Information:     Release of information consent forms completed and in the chart;  Patient's signature needed at discharge.  Patient to Follow up at:  Follow-up Information    Follow up with No mental health follow-up;  attend 90 AA mrtgs in 90 days.         Patient denies SI/HI:   Yes,  yes    Safety Planning and Suicide Prevention discussed:  Yes,  yes  Barrier to discharge identified:No.  Summary and Recommendations:   Trish Mage 12/24/2011, 12:43 PM

## 2011-12-24 NOTE — Progress Notes (Signed)
Inpatient Diabetes Program Recommendations  AACE/ADA: New Consensus Statement on Inpatient Glycemic Control (2013)  Target Ranges:  Prepandial:   less than 140 mg/dL      Peak postprandial:   less than 180 mg/dL (1-2 hours)      Critically ill patients:  140 - 180 mg/dL   Reason for Visit: Consult - diabetes management  Familiar with pt from previous visit on 11/08/2011. Results for [name], [name] (MRN [id number]) as of 12/24/2011 17:07  Ref. Range 12/22/2011 06:05 12/22/2011 11:51 12/22/2011 17:13 12/22/2011 20:21 12/23/2011 06:04 12/23/2011 12:08 12/23/2011 16:51 12/23/2011 21:44 12/24/2011 05:57 12/24/2011 06:24  Glucose-Capillary Latest Range: 70-99 mg/dL 132 (H) 158 (H) 243 (H) 155 (H) 125 (H) 101 (H) 144 (H) 188 (H) 151 (H)   Results for [name], [name] (MRN [id number]) as of 12/24/2011 17:07  Ref. Range 12/24/2011 06:24  Sodium Latest Range: 135-145 mEq/L 129 (L)  Potassium Latest Range: 3.5-5.1 mEq/L 3.7  Chloride Latest Range: 96-112 mEq/L 93 (L)  CO2 Latest Range: 19-32 mEq/L 26  BUN Latest Range: 6-23 mg/dL 14  Creatinine Latest Range: 0.50-1.35 mg/dL 0.55  Calcium Latest Range: 8.4-10.5 mg/dL 10.0  GFR calc non Af Amer Latest Range: >90 mL/min >90  GFR calc Af Amer Latest Range: >90 mL/min >90  Glucose Latest Range: 70-99 mg/dL 144 (H)  Alkaline Phosphatase Latest Range: 39-117 U/L 129 (H)  Albumin Latest Range: 3.5-5.2 g/dL 3.8  AST Latest Range: 0-37 U/L 41 (H)  ALT Latest Range: 0-53 U/L 39  Total Protein Latest Range: 6.0-8.3 g/dL 8.0  Total Bilirubin Latest Range: 0.3-1.2 mg/dL 1.0  Results for [name], [name] (MRN [id number]) as of 12/24/2011 17:07  Ref. Range 11/10/2011 20:21  Hemoglobin A1C Latest Range: <5.7 % 8.8 (H)   Recommendations:  Add Novolog moderate tidwc for correction insulin. Change metformin to 500 mg - Take 2 tabs in am and 1 tab in pm.  Dosage is same, but allows for generic Rx (replaces 750 mg bid) Encourage pt to make healthy food choices, check  blood sugars daily, take meds as prescribed, and f/u with PCP for DM mgmt.  Will follow.

## 2011-12-24 NOTE — Progress Notes (Signed)
D-Patient is isolative to room with no group attendance.  A- Irritable with multiple s/s of withdrawal including diarrhea,tremors and cravings. Compliant with scheduled medication protocol.  Denies SI.  A- Appropriate participation in treatment team. No definitive d/c plans.  R- Continue support and encouragement.  Assess CIWA's and physical safety.  Continue Current POC and evaluation of treatment goals. Continue 15' checks for safety.

## 2011-12-24 NOTE — Progress Notes (Signed)
[facility] Adult Inpatient Family/Significant Other Suicide Prevention Education  Suicide Prevention Education:  Contact Attempts: Alpha Gula, patient's ex-wife at [phone] has been identified as the person(s) who will aid the patient in the event of a mental health crisis.  With written consent from the patient, two attempts were made to provide suicide prevention education, prior to and/or following the patient's discharge.  We were unsuccessful in providing suicide prevention education.  A suicide education pamphlet was given to the patient to share with family/significant other. Date and time of first attempt: 12/23/2011  At [facility] - Recording states the number is not a working Clinical biochemist consulted with patient who verified the number and states he has no other contact information for exwife. Amous refused for Probation officer to contact anyone else. Writer provided suicide prevention education directly to patient at 6:15PM on 10/23/2011; conversation included risk factors, warning signs and resources to contact for help. Mobile crisis services explained and contact card placed in chart for pt to receive at discharge.    [name] 12/24/2011, 9:03 AM

## 2011-12-24 NOTE — Discharge Planning (Signed)
Pt seen in AM group.  Changed his mind about leaving today.  Wants to leave tomorrow as he is still experiencing withdrawal symptoms today.  No mental health follow up.

## 2011-12-25 DIAGNOSIS — F1020 Alcohol dependence, uncomplicated: Secondary | ICD-10-CM

## 2011-12-25 MED ORDER — CEPHALEXIN 500 MG PO CAPS: 500.0000 mg | ORAL_CAPSULE | ORAL | Status: AC

## 2011-12-25 MED ORDER — METFORMIN HCL ER (MOD) 500 MG PO TB24: ORAL_TABLET | ORAL | Status: DC

## 2011-12-25 NOTE — Progress Notes (Signed)
[facility] Group Notes:  (Counselor/Nursing/MHT/Case Management/Adjunct)  12/25/2011 4:40 PM  Type of Therapy:  Group Therapy  Participation Level:  Active  Participation Quality:  Appropriate  Affect:  Anxious, Appropriate and Irritable  Cognitive:  Alert and Oriented  Insight:  Good  Engagement in Group:  Good  Engagement in Therapy:  pt engaged in group but was negative about a local treatment facility and was escallating others in group who are scheduled to go to that same facility  Modes of Intervention:  Education, Problem-solving, Support and   Summary of Progress/Problems: Pt participated in aftercare group and suicide prevention discussion. Pt reported being anxious about discharging and having to ride the bus home. Pt having to move once gets home because unable to go back to same home. Pt reported being a 3 on scale of 1-5 due to his anxiety. Reported no depression. Pt is invested in staying clean and being in recovery but does not want to seek any treatment except going to [facility]. Schedule given to patient of local meetings.   Inez Catalina, MSW, LCSW 12/25/2011, 4:40 PM

## 2011-12-25 NOTE — Progress Notes (Signed)
[facility] Group Notes:  (Counselor/Nursing/MHT/Case Management/Adjunct)  12/25/2011 5:21 PM  Type of Therapy:  Group Therapy  Participation Level:  Did Not Attend    Summary of Progress/Problems: Pt in process of being discharged when group occurred so did not attend.   Inez Catalina, MSW, LCSW 12/25/2011, 5:21 PM

## 2011-12-25 NOTE — [facility] Suicide Risk Assessment (Signed)
Suicide Risk Assessment  Discharge Assessment     Demographic factors:  Male;Caucasian;Divorced or widowed;Living alone;Low socioeconomic status Unemployed  Current Mental Status Per Nursing Assessment::   On Admission:   (none) At Discharge:   no SI  Current Mental Status Per Physician: Mental Status Examination/Evaluation:  Objective: Appearance: cooprative   Psychomotor Activity: Decreased   Eye Contact:: Good   Speech: Normal Rate   Volume: Normal   Mood:irritable   Affect: Restricted   Thought Process: Clear rational; goal oriented - go to [employer]   Orientation: Full   Thought Content: No AVH/psychosis   Suicidal Thoughts: No   Homicidal Thoughts: No   Judgement: fair  Insight: fair    Loss Factors: Legal issues;Financial problems / change in socioeconomic status  Historical Factors:  no SI attemtps  Risk Reduction Factors:   Positive social support  Continued Clinical Symptoms:  Alcohol/Substance Abuse/Dependencies  Discharge Diagnoses:   AXIS I:  Alcohol Dep AXIS II:  Deferred AXIS III:   Past Medical History  Diagnosis Date  . Hepatitis C   . Diabetes mellitus   . Chronic hyponatremia   . Alcoholic   . Alcoholic cirrhosis of liver   . Thrombocytopenia   . Hyperglycemia    AXIS IV:  other psychosocial or environmental problems AXIS V:  51-60 moderate symptoms  Cognitive Features That Contribute To Risk:  Closed-mindedness    Suicide Risk:  Minimal: No identifiable suicidal ideation.  Patients presenting with no risk factors but with morbid ruminations; may be classified as minimal risk based on the severity of the depressive symptoms  Plan Of Care/Follow-up recommendations:   Plan to go for rehab now. Has info  Raiford Simmonds 12/25/2011, 10:12 AM

## 2011-12-25 NOTE — Progress Notes (Signed)
D: Pt complaining of difficulty sleeping.  A: An ordered for 50 mg of trazodone with a repeat was obtained.  R: Pt was asleep at 0200 after his repeat dose of trazodone administered at 0111.

## 2011-12-25 NOTE — Progress Notes (Signed)
Patient ID: [name], male   DOB: January 01, 1956, 56 y.o.   MRN: [id number] Patient cooperative during d/c process. Gave referral to [facility] for f/u medical and instructed to take discharge instructions to visit.  All f/u medication instruction given with samples.  Denies SI. Stated he would attend [employer] for [facility] [facility] [employer].  All belongings returned and escorted to lobby with bus pass.

## 2012-01-09 ENCOUNTER — Emergency Department (HOSPITAL_COMMUNITY): Payer: Self-pay

## 2012-01-09 ENCOUNTER — Encounter (HOSPITAL_COMMUNITY): Payer: Self-pay | Admitting: *Deleted

## 2012-01-09 ENCOUNTER — Emergency Department (HOSPITAL_COMMUNITY)
Admission: EM | Admit: 2012-01-09 | Discharge: 2012-01-10 | Disposition: A | Discharge status: Other Acute Inpatient Hospital | Payer: Self-pay | Attending: Emergency Medicine | Admitting: Emergency Medicine

## 2012-01-09 DIAGNOSIS — F1020 Alcohol dependence, uncomplicated: Secondary | ICD-10-CM | POA: Insufficient documentation

## 2012-01-09 DIAGNOSIS — E119 Type 2 diabetes mellitus without complications: Secondary | ICD-10-CM | POA: Insufficient documentation

## 2012-01-09 DIAGNOSIS — K7030 Alcoholic cirrhosis of liver without ascites: Secondary | ICD-10-CM | POA: Insufficient documentation

## 2012-01-09 DIAGNOSIS — F17200 Nicotine dependence, unspecified, uncomplicated: Secondary | ICD-10-CM | POA: Insufficient documentation

## 2012-01-09 DIAGNOSIS — L03119 Cellulitis of unspecified part of limb: Secondary | ICD-10-CM | POA: Insufficient documentation

## 2012-01-09 DIAGNOSIS — Z794 Long term (current) use of insulin: Secondary | ICD-10-CM | POA: Insufficient documentation

## 2012-01-09 DIAGNOSIS — M79609 Pain in unspecified limb: Secondary | ICD-10-CM | POA: Insufficient documentation

## 2012-01-09 DIAGNOSIS — L02419 Cutaneous abscess of limb, unspecified: Secondary | ICD-10-CM | POA: Insufficient documentation

## 2012-01-09 DIAGNOSIS — B1920 Unspecified viral hepatitis C without hepatic coma: Secondary | ICD-10-CM | POA: Insufficient documentation

## 2012-01-09 LAB — COMPREHENSIVE METABOLIC PANEL
ALT: 51 U/L (ref 0–53)
AST: 65 U/L — ABNORMAL HIGH (ref 0–37)
Alkaline Phosphatase: 120 U/L — ABNORMAL HIGH (ref 39–117)
CO2: 27 mEq/L (ref 19–32)
GFR calc Af Amer: >90 mL/min (ref >90)
GFR calc non Af Amer: >90 mL/min (ref >90)
Glucose, Bld: 178 mg/dL — ABNORMAL HIGH (ref 70–99)
Potassium: 3.8 mEq/L (ref 3.5–5.1)
Sodium: 136 mEq/L (ref 135–145)
Total Protein: 7.8 g/dL (ref 6.0–8.3)

## 2012-01-09 LAB — GLUCOSE, CAPILLARY: Glucose-Capillary: 187 mg/dL — ABNORMAL HIGH (ref 70–99)

## 2012-01-09 LAB — CBC WITH DIFFERENTIAL/PLATELET
Basophils Absolute: 0.1 10*3/uL (ref 0.0–0.1)
Lymphocytes Relative: 50 % — ABNORMAL HIGH (ref 12–46)
Lymphs Abs: 2.5 10*3/uL (ref 0.7–4.0)
Neutrophils Relative %: 38 % — ABNORMAL LOW (ref 43–77)
Platelets: 109 10*3/uL — ABNORMAL LOW (ref 150–400)
RBC: 4.03 MIL/uL — ABNORMAL LOW (ref 4.22–5.81)
WBC: 5.1 10*3/uL (ref 4.0–10.5)

## 2012-01-09 LAB — RAPID URINE DRUG SCREEN, HOSP PERFORMED
Amphetamines: NOT DETECTED
Barbiturates: NOT DETECTED

## 2012-01-09 LAB — ETHANOL: Alcohol, Ethyl (B): 326 mg/dL — ABNORMAL HIGH (ref 0–11)

## 2012-01-09 MED ORDER — INSULIN GLARGINE 100 UNIT/ML ~~LOC~~ SOLN: 12.0000 [IU] | Freq: Every day | SUBCUTANEOUS | Status: DC

## 2012-01-09 MED ORDER — ACETAMINOPHEN 325 MG PO TABS
650.0000 mg | ORAL_TABLET | ORAL | Status: DC | PRN
  Administered 2012-01-10: 650 mg via ORAL
  Filled 2012-01-09: qty 2

## 2012-01-09 MED ORDER — MUPIROCIN CALCIUM 2 % EX CREA
TOPICAL_CREAM | Freq: Two times a day (BID) | CUTANEOUS | Status: DC
  Administered 2012-01-10: 12:00:00 via TOPICAL
  Filled 2012-01-09: qty 15

## 2012-01-09 MED ORDER — IBUPROFEN 200 MG PO TABS
600.0000 mg | ORAL_TABLET | Freq: Three times a day (TID) | ORAL | Status: DC | PRN
  Administered 2012-01-09: 600 mg via ORAL
  Filled 2012-01-09: qty 3

## 2012-01-09 MED ORDER — NICOTINE 21 MG/24HR TD PT24
21.0000 mg | MEDICATED_PATCH | Freq: Every day | TRANSDERMAL | Status: DC
  Administered 2012-01-09 – 2012-01-10 (×2): 21 mg via TRANSDERMAL
  Filled 2012-01-09 (×2): qty 1

## 2012-01-09 MED ORDER — METFORMIN HCL ER 500 MG PO TB24
500.0000 mg | ORAL_TABLET | Freq: Every day | ORAL | Status: DC
  Administered 2012-01-10: 500 mg via ORAL
  Filled 2012-01-09 (×3): qty 1

## 2012-01-09 MED ORDER — SULFAMETHOXAZOLE-TMP DS 800-160 MG PO TABS: 1.0000 | ORAL_TABLET | Freq: Two times a day (BID) | ORAL | Status: DC

## 2012-01-09 MED ORDER — METFORMIN HCL ER 750 MG PO TB24: 750.0000 mg | ORAL_TABLET | Freq: Two times a day (BID) | ORAL | Status: DC

## 2012-01-09 MED ORDER — ONDANSETRON HCL 4 MG PO TABS: 4.0000 mg | ORAL_TABLET | Freq: Three times a day (TID) | ORAL | Status: DC | PRN

## 2012-01-09 MED ORDER — LORAZEPAM 1 MG PO TABS
1.0000 mg | ORAL_TABLET | Freq: Three times a day (TID) | ORAL | Status: DC | PRN
  Administered 2012-01-09 – 2012-01-10 (×2): 1 mg via ORAL
  Filled 2012-01-09 (×2): qty 1

## 2012-01-09 MED ORDER — INSULIN GLARGINE 100 UNIT/ML ~~LOC~~ SOLN
12.0000 [IU] | Freq: Every day | SUBCUTANEOUS | Status: DC
  Administered 2012-01-09: 12 [IU] via SUBCUTANEOUS
  Filled 2012-01-09: qty 1

## 2012-01-09 MED ORDER — CEPHALEXIN 500 MG PO CAPS
500.0000 mg | ORAL_CAPSULE | Freq: Four times a day (QID) | ORAL | Status: DC
  Administered 2012-01-09 – 2012-01-10 (×4): 500 mg via ORAL
  Filled 2012-01-09 (×4): qty 1

## 2012-01-09 NOTE — ED Notes (Signed)
Per EMS.  Pt states he drinks regularly each day and last drink was today.

## 2012-01-09 NOTE — ED Notes (Signed)
Pt wanded and pt placed in paper scrubs

## 2012-01-09 NOTE — ED Notes (Signed)
[id number]<IO> Expected date:<BR> Expected time:<BR> Means of arrival:<BR> Comments:<BR> CLOSED

## 2012-01-09 NOTE — BH Assessment (Signed)
Assessment Note   [name] is an 56 y.o. male.   Pt has a long hx of alcohol use and was just at [facility] for detox and d/c on 12-21-11.  It is doubtful he will be approved to go over to [facility] again given that he just left and did not work a plan towards recovery.  ACT may attempt to place pt at [facility] or [facility] in the AM if pt is appropriate and meets criteria.  Pt has relapsed and is drinking per report 160 oz per day of beer since 12-21-11 when pt was d/c from [facility]  Pt is not at this time suicidal or homicidal ideation nor is he endorsing AVH.  Pt appears appropriate, good eye contact, speech slurred somewhat, judgement appear unimpaired in regards to safety, recall is intact.     Axis I: Alcohol Dependency Axis II: Deferred Axis III:  Past Medical History  Diagnosis Date  . Hepatitis C   . Diabetes mellitus   . Chronic hyponatremia   . Alcoholic   . Alcoholic cirrhosis of liver   . Thrombocytopenia   . Hyperglycemia    Axis IV: housing problems, other psychosocial or environmental problems, problems related to social environment and problems with primary support group Axis V: 51-60 moderate symptoms  Past Medical History:  Past Medical History  Diagnosis Date  . Hepatitis C   . Diabetes mellitus   . Chronic hyponatremia   . Alcoholic   . Alcoholic cirrhosis of liver   . Thrombocytopenia   . Hyperglycemia     Past Surgical History  Procedure Date  . Plate in lower right leg 2008    Family History: No family history on file.  Social History:  reports that he has been smoking Cigarettes.  He has a 40 pack-year smoking history. He does not have any smokeless tobacco history on file. He reports that he drinks alcohol. He reports that he does not use illicit drugs.  Additional Social History:  Alcohol / Drug Use Pain Medications: na Prescriptions: na Over the Counter: na History of alcohol / drug use?: Yes Substance #1 Name of Substance 1: alcohol 1 - Age of First Use:  14 1 - Amount (size/oz): 4 40s or more 1 - Frequency: daily 1 - Duration: 2 weeks 1 - Last Use / Amount: 01-09-12  CIWA: CIWA-Ar BP: 122/75 mmHg Pulse Rate: 88  Nausea and Vomiting: no nausea and no vomiting Tactile Disturbances: none Tremor: no tremor Auditory Disturbances: not present Paroxysmal Sweats: no sweat visible Visual Disturbances: not present Anxiety: no anxiety, at ease Headache, Fullness in Head: none present Agitation: normal activity Orientation and Clouding of Sensorium: oriented and can do serial additions CIWA-Ar Total: 0  COWS:    Allergies:  Allergies  Allergen Reactions  . Sulfa Antibiotics   . Librium (Chlordiazepoxide Hcl) Anxiety    Home Medications:  (Not in a hospital admission)  OB/GYN Status:  No LMP for male patient.  General Assessment Data Location of Assessment: [facility] Living Arrangements: Other (Comment) Can pt return to current living arrangement?: Yes Admission Status: Voluntary Is patient capable of signing voluntary admission?: Yes Transfer from: [facility] Referral Source: MD  Education Status Is patient currently in school?: No  Risk to self Suicidal Ideation: No Suicidal Intent: No Is patient at risk for suicide?: No Suicidal Plan?: No Access to Means: No What has been your use of drugs/alcohol within the last 12 months?: yes Previous Attempts/Gestures: No How many times?: 0  Other Self Harm Risks: no Triggers for Past Attempts: None known Intentional Self Injurious Behavior: None Family Suicide History: No Recent stressful life event(s): Financial Problems Persecutory voices/beliefs?: No Depression: Yes Depression Symptoms: Feeling worthless/self pity;Loss of interest in usual pleasures;Fatigue Substance abuse history and/or treatment for substance abuse?: Yes Suicide prevention information given to non-admitted patients: Not applicable  Risk to Others Homicidal Ideation: No Thoughts of Harm to Others:  No Current Homicidal Intent: No Current Homicidal Plan: No Access to Homicidal Means: No Identified Victim: none History of harm to others?: No Assessment of Violence: None Noted Violent Behavior Description: cooperative Does patient have access to weapons?: No Criminal Charges Pending?: No Does patient have a court date: No  Psychosis Hallucinations: None noted Delusions: None noted  Mental Status Report Appear/Hygiene: Disheveled Eye Contact: Fair Motor Activity: Unremarkable Speech: Logical/coherent Level of Consciousness: Alert Mood: Depressed;Anxious Affect: Anxious Anxiety Level: Minimal Thought Processes: Coherent Judgement: Unimpaired Orientation: Person;Place;Time;Situation Obsessive Compulsive Thoughts/Behaviors: None  Cognitive Functioning Concentration: Decreased Memory: Recent Intact IQ: Average Insight: Fair Impulse Control: Poor Appetite: Fair Weight Loss: 0  Weight Gain: 0  Sleep: Decreased Total Hours of Sleep: 4  Vegetative Symptoms: None  ADLScreening [facility] Assessment Services) Patient's cognitive ability adequate to safely complete daily activities?: Yes Patient able to express need for assistance with ADLs?: Yes Independently performs ADLs?: Yes (appropriate for developmental age)  Abuse/Neglect [facility]) Physical Abuse: Denies Verbal Abuse: Denies Sexual Abuse: Denies  Prior Inpatient Therapy Prior Inpatient Therapy: Yes Prior Therapy Dates: 2012, 2013 Prior Therapy Facilty/Provider(s): [facility], [facility], [facility] Reason for Treatment: DETOX  Prior Outpatient Therapy Prior Outpatient Therapy: No Prior Therapy Dates: NA Prior Therapy Facilty/Provider(s): NA Reason for Treatment: NA  ADL Screening (condition at time of admission) Patient's cognitive ability adequate to safely complete daily activities?: Yes Patient able to express need for assistance with ADLs?: Yes Independently performs ADLs?: Yes (appropriate for developmental  age) Weakness of Legs: None Weakness of Arms/Hands: None  Home Assistive Devices/Equipment Home Assistive Devices/Equipment: None  Therapy Consults (therapy consults require a physician order) PT Evaluation Needed: No OT Evalulation Needed: No SLP Evaluation Needed: No Abuse/Neglect Assessment (Assessment to be complete while patient is alone) Physical Abuse: Denies Verbal Abuse: Denies Sexual Abuse: Denies Exploitation of patient/patient's resources: Denies Self-Neglect: Denies Values / Beliefs Cultural Requests During Hospitalization: None Spiritual Requests During Hospitalization: None Consults Spiritual Care Consult Needed: No Social Work Consult Needed: Yes (Comment) (may need help with housing) Advance Directives (For Healthcare) Advance Directive: Patient does not have advance directive Pre-existing out of facility DNR order (yellow form or pink MOST form): No Nutrition Screen- [facility] Adult/[facility] Have you recently lost weight without trying?: No Have you been eating poorly because of a decreased appetite?: No Malnutrition Screening Tool Score: 0   Additional Information 1:1 In Past 12 Months?: No CIRT Risk: No Elopement Risk: No Does patient have medical clearance?: Yes     Disposition:  Disposition Disposition of Patient: Other dispositions (referred to [facility], [facility] and [facility]) Type of inpatient treatment program: Adult Other disposition(s): Other (Comment) (detox if appropriate)  On Site Evaluation by:   Reviewed with Physician:     Orlan Leavens, Graciela Husbands 01/09/2012 6:48 PM

## 2012-01-09 NOTE — ED Provider Notes (Signed)
History     CSN: [id number]  Arrival date & time 01/09/12  1642   First MD Initiated Contact with Patient 01/09/12 1658      Chief Complaint  Patient presents with  . Alcohol Problem  . Medical Clearance    (Consider location/radiation/quality/duration/timing/severity/associated sxs/prior treatment) Patient is a 56 y.o. male presenting with alcohol problem. The history is provided by the patient. No language interpreter was used.  Alcohol Problem This is a chronic problem. The current episode started more than 1 year ago. The problem occurs constantly. The problem has been unchanged. Pertinent negatives include no abdominal pain, chest pain, congestion, fever, headaches, nausea, neck pain or vomiting. The symptoms are aggravated by drinking. He has tried nothing for the symptoms.   56 year old male coming in with complaint of needing detox from alcohol. Patient was admitted on the eighth of this month for the same. States he's been staying in a "halfway house" where people drink. States that he just cannot stop drinking. States that he drinks 4 440's a day. Denies suicidal or homicidal ideations. States that he does have a injury to his right lower extremity from falling down the basement stairs a week ago. The area looks red inflamed with no fluctuation. X-rays pending. Past medical history of diabetes, hepatitis C, cirrhosis of the liver,  hyponatremia, thrombocytopenia,. States that his last drink was one hour. Cooperative. He is a smoker.   Past Medical History  Diagnosis Date  . Hepatitis C   . Diabetes mellitus   . Chronic hyponatremia   . Alcoholic   . Alcoholic cirrhosis of liver   . Thrombocytopenia   . Hyperglycemia     Past Surgical History  Procedure Date  . Plate in lower right leg 2008    No family history on file.  History  Substance Use Topics  . Smoking status: Current Everyday Smoker -- 1.0 packs/day for 40 years    Types: Cigarettes  . Smokeless tobacco:  Not on file  . Alcohol Use: 0.0 oz/week     4-6 40oz daily      Review of Systems  Constitutional: Negative.  Negative for fever.  HENT: Negative.  Negative for congestion and neck pain.   Eyes: Negative.   Respiratory: Negative.   Cardiovascular: Negative.  Negative for chest pain.  Gastrointestinal: Negative.  Negative for nausea, vomiting and abdominal pain.  Musculoskeletal:       RLE pain  Neurological: Negative.  Negative for headaches.  Psychiatric/Behavioral: Negative.   All other systems reviewed and are negative.    Allergies  Sulfa antibiotics and Librium  Home Medications   Current Outpatient Rx  Name Route Sig Dispense Refill  . INSULIN GLARGINE 100 UNIT/ML [place] SOLN Subcutaneous Inject 12 Units into the skin at bedtime.     Marland Kitchen METFORMIN HCL ER 500 MG PO TB24 Oral Take 750 mg by mouth 2 (two) times daily.      BP 122/75  Pulse 88  Temp 98.6 F (37 C) (Oral)  Resp 18  SpO2 96%  Physical Exam  Nursing note and vitals reviewed. Constitutional: He is oriented to person, place, and time. He appears well-developed and well-nourished.  HENT:  Head: Normocephalic.  Eyes: Conjunctivae and EOM are normal. Pupils are equal, round, and reactive to light.  Neck: Normal range of motion. Neck supple.  Cardiovascular: Normal rate.   Pulmonary/Chest: Effort normal.  Abdominal: Soft.  Musculoskeletal: Normal range of motion. He exhibits edema and tenderness.  RLE cellulitis.  Neurological: He is alert and oriented to person, place, and time.  Skin: Skin is warm and dry.       Ruddy complexion  Psychiatric: He has a normal mood and affect.    ED Course  Procedures (including critical care time)   Labs Reviewed  CBC WITH DIFFERENTIAL  COMPREHENSIVE METABOLIC PANEL  URINE RAPID DRUG SCREEN (HOSP PERFORMED)  ETHANOL   No results found.   No diagnosis found.    MDM  56 year old male coming in for alcohol detox. Patient has been admitted today or  else in the recent past for the same. Patient is a diabetic on Lantus insulin. Holding orders in. Home meds in. Patient does have a cellulitis to his lower extremity he was started on Bactrim for this. X-ray is unremarkable for osteomelitis. Packed team aware patient is here 1am report given to Dr. Edrick Kins. Patient is awaiting ACT team assessment and placement.          Julieta Bellini, NP 01/10/12 [phone]

## 2012-01-09 NOTE — ED Notes (Signed)
Pt reports drinking a case of beer a day. Pt reports he drank half a case today. Pt reports he left detox two weeks ago for alcohol abuse. Pt denies headaches or dizziness. No tremors noted. Pt alert and oriented and in no apparent distress. Pt denies SI/HI

## 2012-01-09 NOTE — ED Notes (Signed)
One pair of jeans, one shirt, one pair of boxers, one pair of socks, one pair of tennis shoes, one phone, and one wallet all placed in a patient belongings bag. Patient and belongings wanded by security.

## 2012-01-10 LAB — GLUCOSE, CAPILLARY
Glucose-Capillary: 152 mg/dL — ABNORMAL HIGH (ref 70–99)
Glucose-Capillary: 174 mg/dL — ABNORMAL HIGH (ref 70–99)

## 2012-01-10 MED ORDER — FOLIC ACID 1 MG PO TABS
1.0000 mg | ORAL_TABLET | Freq: Every day | ORAL | Status: DC
  Administered 2012-01-10: 1 mg via ORAL
  Filled 2012-01-10: qty 1

## 2012-01-10 MED ORDER — METFORMIN HCL 500 MG PO TABS: 500.0000 mg | ORAL_TABLET | Freq: Two times a day (BID) | ORAL | Status: DC

## 2012-01-10 MED ORDER — LORAZEPAM 1 MG PO TABS: 0.0000 mg | ORAL_TABLET | Freq: Two times a day (BID) | ORAL | Status: DC

## 2012-01-10 MED ORDER — ADULT MULTIVITAMIN W/MINERALS CH
1.0000 | ORAL_TABLET | Freq: Every day | ORAL | Status: DC
  Administered 2012-01-10: 1 via ORAL
  Filled 2012-01-10: qty 1

## 2012-01-10 MED ORDER — INSULIN GLARGINE 100 UNIT/ML ~~LOC~~ SOLN: 12.0000 [IU] | Freq: Every day | SUBCUTANEOUS | Status: DC

## 2012-01-10 MED ORDER — THIAMINE HCL 100 MG/ML IJ SOLN: 100.0000 mg | Freq: Every day | INTRAMUSCULAR | Status: DC

## 2012-01-10 MED ORDER — LORAZEPAM 1 MG PO TABS
0.0000 mg | ORAL_TABLET | Freq: Four times a day (QID) | ORAL | Status: DC
  Administered 2012-01-10: 1 mg via ORAL
  Filled 2012-01-10: qty 2

## 2012-01-10 MED ORDER — VITAMIN B-1 100 MG PO TABS
100.0000 mg | ORAL_TABLET | Freq: Every day | ORAL | Status: DC
  Administered 2012-01-10: 100 mg via ORAL
  Filled 2012-01-10: qty 1

## 2012-01-10 NOTE — Progress Notes (Signed)
CSW provided patient with bus pass & train ticket to [facility]. RN, Heloise Purpura made aware. Patient anxious to discharge.   Winfred Leeds, [facility] Clinical Social Worker cell #: [phone]

## 2012-01-10 NOTE — Discharge Summary (Signed)
Physician Discharge Summary Note  Patient:  [name] is an 56 y.o., male MRN:  [id number] DOB:  [birth year] Patient phone:  [phone] (home)  Patient address:   [address] [place] [address]   Date of Admission:  12/21/2011 Date of Discharge: 12/25/2011  Discharge Diagnoses: Principal Problem:  *Alcohol dependence with acute alcoholic intoxication  Axis Diagnosis:  Discharge Diagnoses:  AXIS I: Alcohol Dep  AXIS II: Deferred  AXIS III:  Past Medical History   Diagnosis  Date   .  Hepatitis C    .  Diabetes mellitus    .  Chronic hyponatremia    .  Alcoholic    .  Alcoholic cirrhosis of liver    .  Thrombocytopenia    .  Hyperglycemia    AXIS IV: other psychosocial or environmental problems  AXIS V: 51-60 moderate symptoms   Level of Care:  OP  Hospital Course:     [name] was admitted for detox from alcohol and crisis management.  He was treated with the standard Librium protocol.  Medical problems were identified and treated.  Home medication was restarted as appropriate.     Improvement was monitored by CIWA scores and patient's daily report of withdrawal symptom reduction. Emotional and mental status was monitored by daily self inventory reports completed by the patient and clinical staff.  The patient's participation in unit programming was poor.   Safety, appropriate behavior, and positive interaction in the unit milieu was not a problem.      The patient was evaluated by the treatment team for stability and plans for continued recovery upon discharge. He was offered further treatment options upon discharge including Residential, IOP, and Outpatient treatment. The patient's motivation was an integral factor for scheduling further treatment.      He left [facility] with all personal belongings via bus pas for  transportation in no apparent distress.  Consults:  IM for his hypernatremia  Significant Diagnostic Studies:  None  Discharge Vitals:   Blood pressure  129/74, pulse 89, temperature 97.5 F (36.4 C), temperature source Oral, resp. rate 18, height 5' 11" (1.803 m), weight 76.658 kg (169 lb)..  Mental Status Exam: See Mental Status Examination and Suicide Risk Assessment completed by Attending Physician prior to discharge. Discharge destination:  Home  Is patient on multiple antipsychotic therapies at discharge:  No  Has Patient had three or more failed trials of antipsychotic monotherapy by history: N/A Recommended Plan for Multiple Antipsychotic Therapies: N/A Discharge Orders    Future Orders Please Complete By Expires   Diet - low sodium heart healthy      Increase activity slowly      Discharge instructions      Comments:   Take all of your medications as prescribed.  Be sure to keep ALL follow up appointments as scheduled. This is to ensure getting your refills on time to avoid any interruption in your medication.  If you find that you can not keep your appointment, call the clinic and reschedule. Be sure to tell the nurse if you will need a refill before your appointment.     Med List    Warning       Cannot display patient medications because the patient has not yet arrived.            Follow-up Information    Follow up with No mental health follow-up;  attend 90 AA mrtgs in 90 days.  Follow-up recommendations:   Activities: Resume typical activities Diet: Resume typical diet Tests: none Comments:   Take all your medications as prescribed by your mental healthcare provider. Report any adverse effects and or reactions from your medicines to your outpatient provider promptly. Patient is instructed and cautioned to not engage in alcohol and or illegal drug use while on prescription medicines. In the event of worsening symptoms, patient is instructed to call the crisis hotline, 911 and or go to the nearest ED for appropriate evaluation and treatment of symptoms.  Signed: Adan Beal 01/10/2012 2:52 PM

## 2012-01-10 NOTE — ED Notes (Signed)
MD at bedside. pickering

## 2012-01-10 NOTE — ED Provider Notes (Addendum)
  Physical Exam  BP 165/93  Pulse 82  Temp 98 F (36.7 C) (Oral)  Resp 22  SpO2 95%  Physical Exam  ED Course  Procedures  MDM Patient is pending placement for alcoholism. Repeat alcohol low 200s. He may be placed at [facility]. They requested a written prescription for metformin 500 twice a day.      [name]. Alvino Chapel, MD 01/10/12 [phone]  Patient will now go to [facility].  [name]. Alvino Chapel, MD 01/10/12 1536

## 2012-01-10 NOTE — ED Notes (Signed)
[facility] notified that pt would be taking bus to train station and would be riding to train to [place] to be picked up at 7:10pm.

## 2012-01-10 NOTE — ED Notes (Signed)
Paged case manager

## 2012-01-11 NOTE — Care Management Note (Signed)
Page 1 of 1   01/10/2012     4:00:01 PM   CARE MANAGEMENT NOTE 01/10/2012  Patient:  [name], [name]   Account Number:  [id number]  Date Initiated:  01/10/2012  Documentation initiated by:  Dessa Phi  Subjective/Objective Assessment:   DETOX.     Action/Plan:   FROM HOME   Anticipated DC Date:  01/10/2012   Anticipated DC Plan:  IP REHAB FACILITY  In-house referral  Clinical Social Worker      DC Planning Services  CM consult  Medication Assistance      Choice offered to / List presented to:             Status of service:  Completed, signed off Medicare Important Message given?   (If response is "NO", the following Medicare IM given date fields will be blank) Date Medicare IM given:   Date Additional Medicare IM given:    Discharge Disposition:  IP REHAB FACILITY  Per UR Regulation:    If discussed at Long Length of Stay Meetings, dates discussed:    Comments:  01/10/12 Saramarie Stinger RN,BSN [facility].QUALIFIES FOR INDIGENT FUNDS PER PHARMACY.[facility] VIAL.D/C TO [facility].

## 2012-01-12 NOTE — ED Provider Notes (Signed)
Medical screening examination/treatment/procedure(s) were performed by non-physician practitioner and as supervising physician I was immediately available for consultation/collaboration.  Leota Jacobsen, MD 01/12/12 2055

## 2012-01-12 NOTE — Discharge Summary (Signed)
Read and reviewed.

## 2012-01-27 ENCOUNTER — Encounter (HOSPITAL_COMMUNITY): Payer: Self-pay | Admitting: Emergency Medicine

## 2012-01-27 ENCOUNTER — Emergency Department (HOSPITAL_COMMUNITY)
Admission: EM | Admit: 2012-01-27 | Discharge: 2012-01-29 | Disposition: A | Discharge status: Other Acute Inpatient Hospital | Payer: Self-pay | Attending: Emergency Medicine | Admitting: Emergency Medicine

## 2012-01-27 DIAGNOSIS — F411 Generalized anxiety disorder: Secondary | ICD-10-CM | POA: Insufficient documentation

## 2012-01-27 DIAGNOSIS — Z8619 Personal history of other infectious and parasitic diseases: Secondary | ICD-10-CM | POA: Insufficient documentation

## 2012-01-27 DIAGNOSIS — E119 Type 2 diabetes mellitus without complications: Secondary | ICD-10-CM | POA: Insufficient documentation

## 2012-01-27 DIAGNOSIS — F1010 Alcohol abuse, uncomplicated: Secondary | ICD-10-CM | POA: Insufficient documentation

## 2012-01-27 DIAGNOSIS — Z794 Long term (current) use of insulin: Secondary | ICD-10-CM | POA: Insufficient documentation

## 2012-01-27 LAB — CBC
HCT: 42.5 % (ref 39.0–52.0)
RDW: 13.6 % (ref 11.5–15.5)
WBC: 4.1 10*3/uL (ref 4.0–10.5)

## 2012-01-27 LAB — COMPREHENSIVE METABOLIC PANEL
ALT: 71 U/L — ABNORMAL HIGH (ref 0–53)
AST: 105 U/L — ABNORMAL HIGH (ref 0–37)
Albumin: 3.7 g/dL (ref 3.5–5.2)
Alkaline Phosphatase: 109 U/L (ref 39–117)
BUN: 7 mg/dL (ref 6–23)
Chloride: 95 mEq/L — ABNORMAL LOW (ref 96–112)
Potassium: 4.2 mEq/L (ref 3.5–5.1)
Sodium: 133 mEq/L — ABNORMAL LOW (ref 135–145)
Total Bilirubin: 0.3 mg/dL (ref 0.3–1.2)

## 2012-01-27 LAB — RAPID URINE DRUG SCREEN, HOSP PERFORMED
Amphetamines: NOT DETECTED
Barbiturates: NOT DETECTED
Benzodiazepines: NOT DETECTED
Tetrahydrocannabinol: NOT DETECTED

## 2012-01-27 LAB — ACETAMINOPHEN LEVEL: Acetaminophen (Tylenol), Serum: <15 ug/mL (ref 10–30)

## 2012-01-27 LAB — GLUCOSE, CAPILLARY: Glucose-Capillary: 169 mg/dL — ABNORMAL HIGH (ref 70–99)

## 2012-01-27 LAB — ETHANOL: Alcohol, Ethyl (B): 335 mg/dL — ABNORMAL HIGH (ref 0–11)

## 2012-01-27 MED ORDER — IBUPROFEN 600 MG PO TABS: 600.0000 mg | ORAL_TABLET | Freq: Three times a day (TID) | ORAL | Status: DC | PRN

## 2012-01-27 MED ORDER — NICOTINE 21 MG/24HR TD PT24
21.0000 mg | MEDICATED_PATCH | Freq: Every day | TRANSDERMAL | Status: DC
  Administered 2012-01-27: 21 mg via TRANSDERMAL
  Filled 2012-01-27 (×2): qty 1

## 2012-01-27 MED ORDER — THIAMINE HCL 100 MG/ML IJ SOLN: 100.0000 mg | Freq: Every day | INTRAMUSCULAR | Status: DC

## 2012-01-27 MED ORDER — LORAZEPAM 1 MG PO TABS: 0.0000 mg | ORAL_TABLET | Freq: Two times a day (BID) | ORAL | Status: DC

## 2012-01-27 MED ORDER — ACETAMINOPHEN 325 MG PO TABS: 650.0000 mg | ORAL_TABLET | ORAL | Status: DC | PRN

## 2012-01-27 MED ORDER — LORAZEPAM 1 MG PO TABS
0.0000 mg | ORAL_TABLET | Freq: Four times a day (QID) | ORAL | Status: DC
  Administered 2012-01-27: 1 mg via ORAL
  Administered 2012-01-28: 2 mg via ORAL
  Administered 2012-01-28 (×2): 1 mg via ORAL
  Administered 2012-01-28: 2 mg via ORAL
  Filled 2012-01-27: qty 1
  Filled 2012-01-27: qty 2
  Filled 2012-01-27 (×2): qty 1
  Filled 2012-01-27: qty 2

## 2012-01-27 MED ORDER — FOLIC ACID 1 MG PO TABS
1.0000 mg | ORAL_TABLET | Freq: Every day | ORAL | Status: DC
  Administered 2012-01-27 – 2012-01-28 (×2): 1 mg via ORAL
  Filled 2012-01-27 (×2): qty 1

## 2012-01-27 MED ORDER — LORAZEPAM 1 MG PO TABS
1.0000 mg | ORAL_TABLET | Freq: Four times a day (QID) | ORAL | Status: DC | PRN
  Administered 2012-01-28 – 2012-01-29 (×2): 1 mg via ORAL
  Filled 2012-01-27 (×2): qty 1

## 2012-01-27 MED ORDER — ALUM & MAG HYDROXIDE-SIMETH 200-200-20 MG/5ML PO SUSP: 30.0000 mL | ORAL | Status: DC | PRN

## 2012-01-27 MED ORDER — ADULT MULTIVITAMIN W/MINERALS CH
1.0000 | ORAL_TABLET | Freq: Every day | ORAL | Status: DC
  Administered 2012-01-27 – 2012-01-28 (×2): 1 via ORAL
  Filled 2012-01-27 (×2): qty 1

## 2012-01-27 MED ORDER — LORAZEPAM 2 MG/ML IJ SOLN: 1.0000 mg | Freq: Four times a day (QID) | INTRAMUSCULAR | Status: DC | PRN

## 2012-01-27 MED ORDER — VITAMIN B-1 100 MG PO TABS
100.0000 mg | ORAL_TABLET | Freq: Every day | ORAL | Status: DC
  Administered 2012-01-27 – 2012-01-28 (×2): 100 mg via ORAL
  Filled 2012-01-27 (×2): qty 1

## 2012-01-27 MED ORDER — ONDANSETRON HCL 4 MG PO TABS
4.0000 mg | ORAL_TABLET | Freq: Three times a day (TID) | ORAL | Status: DC | PRN
  Administered 2012-01-28 (×2): 4 mg via ORAL
  Filled 2012-01-27 (×3): qty 1

## 2012-01-27 MED ORDER — INSULIN GLARGINE 100 UNIT/ML ~~LOC~~ SOLN
12.0000 [IU] | Freq: Every day | SUBCUTANEOUS | Status: DC
  Administered 2012-01-27 – 2012-01-28 (×2): 12 [IU] via SUBCUTANEOUS
  Filled 2012-01-27 (×2): qty 1

## 2012-01-27 MED ORDER — METFORMIN HCL ER 750 MG PO TB24
750.0000 mg | ORAL_TABLET | Freq: Two times a day (BID) | ORAL | Status: DC
  Administered 2012-01-28 (×2): 750 mg via ORAL
  Filled 2012-01-27 (×6): qty 1

## 2012-01-27 NOTE — BH Assessment (Signed)
Assessment Note   [name] is a 56 y.o. male who presents to [facility] for detox from alcohol.   Pt reports drinking 1 case or 3-6pks daily, last drink was 01/26/12.  Pt recently d/c'd from [facility] 3 wks ago for detox and states he was working and was laid off from job and had nothing to do and started drinking again.  Pt denies SI/HI/Psych.  Pt has no current legal charge, however he is on probation for misappropriation of funds charge for a fraudulent check.  Pt c/o anxiety as w/d sxs.  Pt denies any seizure activity, but has had issues with blackouts.  Past inpt hx with [facility], [facility], [facility] in 2012-2013.    Axis I: Alcohol Abuse Axis II: Deferred Axis III:  Past Medical History  Diagnosis Date  . Hepatitis C   . Diabetes mellitus   . Chronic hyponatremia   . Alcoholic   . Alcoholic cirrhosis of liver   . Thrombocytopenia   . Hyperglycemia    Axis IV: economic problems, occupational problems, other psychosocial or environmental problems, problems related to social environment and problems with primary support group Axis V: 51-60 moderate symptoms  Past Medical History:  Past Medical History  Diagnosis Date  . Hepatitis C   . Diabetes mellitus   . Chronic hyponatremia   . Alcoholic   . Alcoholic cirrhosis of liver   . Thrombocytopenia   . Hyperglycemia     Past Surgical History  Procedure Date  . Plate in lower right leg 2008    Family History: No family history on file.  Social History:  reports that he has been smoking Cigarettes.  He has a 40 pack-year smoking history. He does not have any smokeless tobacco history on file. He reports that he drinks alcohol. He reports that he does not use illicit drugs.  Additional Social History:  Alcohol / Drug Use Pain Medications: None  Prescriptions: None  Over the Counter: None  History of alcohol / drug use?: Yes Longest period of sobriety (when/how long): Intermittent  Substance #2 Name of Substance 2: Alcohol  2 - Age of  First Use: Teens  2 - Amount (size/oz): 1 Case, sometimes 3-6 pks   2 - Frequency: Daily  2 - Duration: On-going  2 - Last Use / Amount: 01/26/12  CIWA: CIWA-Ar BP: 148/85 mmHg Pulse Rate: 79  Nausea and Vomiting: no nausea and no vomiting Tactile Disturbances: none Tremor: no tremor Auditory Disturbances: not present Paroxysmal Sweats: no sweat visible Visual Disturbances: not present Anxiety: two Headache, Fullness in Head: none present Agitation: normal activity Orientation and Clouding of Sensorium: oriented and can do serial additions CIWA-Ar Total: 2  COWS:    Allergies:  Allergies  Allergen Reactions  . Sulfa Antibiotics   . Librium (Chlordiazepoxide Hcl) Anxiety    Home Medications:  (Not in a hospital admission)  OB/GYN Status:  No LMP for male patient.  General Assessment Data [facility] of [facility]: [facility] Living Arrangements: Alone Can pt return to current living arrangement?: Yes Admission Status: Voluntary Is patient capable of signing voluntary admission?: Yes Transfer from: [facility] Referral Source: MD  Education Status Is patient currently in school?: No Current Grade: None  Highest grade of school patient has completed: None  Name of school: None  Contact person: None   Risk to self Suicidal Ideation: No Suicidal Intent: No Is patient at risk for suicide?: No Suicidal Plan?: No Access to Means: No What has been your use  of drugs/alcohol within the last 12 months?: Abusing Alcohol Previous Attempts/Gestures: No How many times?: 0  Other Self Harm Risks: None  Triggers for Past Attempts: None known Intentional Self Injurious Behavior: None Family Suicide History: No Recent stressful life event(s): Other (Comment);Job Loss (Chronic SA ) Persecutory voices/beliefs?: No Depression: Yes Depression Symptoms: Loss of interest in usual pleasures Substance abuse history and/or treatment for substance abuse?: Yes Suicide prevention  information given to non-admitted patients: Not applicable  Risk to Others Homicidal Ideation: No Thoughts of Harm to Others: No Current Homicidal Intent: No Current Homicidal Plan: No Access to Homicidal Means: No Identified Victim: None History of harm to others?: No Assessment of Violence: None Noted Violent Behavior Description: None  Does patient have access to weapons?: No Criminal Charges Pending?: No Does patient have a court date: No  Psychosis Hallucinations: None noted Delusions: None noted  Mental Status Report Appear/Hygiene: Disheveled (Body Odor ) Eye Contact: Fair Motor Activity: Unremarkable Speech: Logical/coherent Level of Consciousness: Alert Mood: Depressed Affect: Appropriate to circumstance;Depressed Anxiety Level: None Thought Processes: Coherent;Relevant Judgement: Unimpaired Orientation: Person;Place;Time;Situation Obsessive Compulsive Thoughts/Behaviors: None  Cognitive Functioning Concentration: Normal Memory: Recent Intact;Remote Intact IQ: Average Insight: Fair Impulse Control: Fair Appetite: Fair Weight Loss: 0  Weight Gain: 0  Sleep: No Change Total Hours of Sleep: 6  Vegetative Symptoms: None  ADLScreening [facility] Assessment Services) Patient's cognitive ability adequate to safely complete daily activities?: Yes Patient able to express need for assistance with ADLs?: Yes Independently performs ADLs?: Yes (appropriate for developmental age)  Abuse/Neglect [facility]) Physical Abuse: Denies Verbal Abuse: Denies Sexual Abuse: Denies  Prior Inpatient Therapy Prior Inpatient Therapy: Yes Prior Therapy Dates: 2012-2013 Prior Therapy Facilty/Provider(s): [facility], [facility], [facility]  Reason for Treatment: Detox   Prior Outpatient Therapy Prior Outpatient Therapy: No Prior Therapy Dates: None  Prior Therapy Facilty/Provider(s): None  Reason for Treatment: None   ADL Screening (condition at time of admission) Patient's cognitive ability adequate  to safely complete daily activities?: Yes Patient able to express need for assistance with ADLs?: Yes Independently performs ADLs?: Yes (appropriate for developmental age) Weakness of Legs: None Weakness of Arms/Hands: None  Home Assistive Devices/Equipment Home Assistive Devices/Equipment: None  Therapy Consults (therapy consults require a physician order) PT Evaluation Needed: No OT Evalulation Needed: No SLP Evaluation Needed: No Abuse/Neglect Assessment (Assessment to be complete while patient is alone) Physical Abuse: Denies Verbal Abuse: Denies Sexual Abuse: Denies Exploitation of patient/patient's resources: Denies Self-Neglect: Denies Values / Beliefs Cultural Requests During Hospitalization: None Spiritual Requests During Hospitalization: None Consults Spiritual Care Consult Needed: No Social Work Consult Needed: No Regulatory affairs officer (For Healthcare) Advance Directive: Patient does not have advance directive;Patient would not like information Pre-existing out of facility DNR order (yellow form or pink MOST form): No Nutrition Screen- [facility] Adult/[facility] Patient's home diet: Regular Have you recently lost weight without trying?: No Have you been eating poorly because of a decreased appetite?: No Malnutrition Screening Tool Score: 0   Additional Information 1:1 In Past 12 Months?: No CIRT Risk: No Elopement Risk: No Does patient have medical clearance?: Yes     Disposition:  Disposition Disposition of Patient: Inpatient treatment program;Referred to ([facility] ) Type of inpatient treatment program: Adult Patient referred to: [facility]  On Site Evaluation by:   Reviewed with Physician:     Polo Riley C 01/27/2012 11:40 PM

## 2012-01-27 NOTE — ED Notes (Signed)
States he can't afford to keep drinking-states was in detox a month ago, work/money stress caused him to start drinking again

## 2012-01-27 NOTE — ED Notes (Addendum)
Wants detox from etoh, no drug use-- denies drug use. Has been in detox 1 month ago-- outpatient treatment -- going to [facility]. 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

## 2012-01-27 NOTE — ED Provider Notes (Signed)
Medical screening examination/treatment/procedure(s) were performed by non-physician practitioner and as supervising physician I was immediately available for consultation/collaboration.  Leota Jacobsen, MD 01/27/12 [phone]

## 2012-01-27 NOTE — ED Provider Notes (Signed)
History     CSN: [id number]  Arrival date & time 01/27/12  1433   None     Chief Complaint  Patient presents with  . detox     (Consider location/radiation/quality/duration/timing/severity/associated sxs/prior treatment) HPI Comments: Patient presents with a chief complaint of alcohol detox. He reports drinking a case of beer per day per usual. He denies any illicit drug use. His binge drinking is triggered by life stress. Has been through periods of time where he is sober. Medical history includes diabetes, hepatitis C, and thrombocytopenia. He denies suicidal/homicidal ideations.    Past Medical History  Diagnosis Date  . Hepatitis C   . Diabetes mellitus   . Chronic hyponatremia   . Alcoholic   . Alcoholic cirrhosis of liver   . Thrombocytopenia   . Hyperglycemia     Past Surgical History  Procedure Date  . Plate in lower right leg 2008    No family history on file.  History  Substance Use Topics  . Smoking status: Current Every Day Smoker -- 1.0 packs/day for 40 years    Types: Cigarettes  . Smokeless tobacco: Not on file  . Alcohol Use: 0.0 oz/week     12 pk- to a case of beer a day      Review of Systems  Psychiatric/Behavioral: The patient is nervous/anxious.   All other systems reviewed and are negative.    Allergies  Sulfa antibiotics and Librium  Home Medications   Current Outpatient Rx  Name Route Sig Dispense Refill  . INSULIN GLARGINE 100 UNIT/ML [place] SOLN Subcutaneous Inject 12 Units into the skin at bedtime.     Marland Kitchen METFORMIN HCL ER 750 MG PO TB24 Oral Take 750 mg by mouth 2 (two) times daily.      BP 116/71  Pulse 90  Temp 98 F (36.7 C) (Oral)  Resp 18  SpO2 95%  Physical Exam  Nursing note and vitals reviewed. Constitutional: He is oriented to person, place, and time. He appears well-developed and well-nourished. No distress.  HENT:  Head: Normocephalic and atraumatic.  Eyes: Conjunctivae normal and EOM are normal. No scleral  icterus.  Neck: Normal range of motion. Neck supple.  Cardiovascular: Normal rate.   Pulmonary/Chest: Effort normal. No respiratory distress.  Abdominal: He exhibits no distension.  Musculoskeletal: Normal range of motion.  Neurological: He is alert and oriented to person, place, and time. Coordination normal.  Skin: Skin is warm and dry. He is not diaphoretic.  Psychiatric: His behavior is normal.       Patient feels anxious.     ED Course  Procedures (including critical care time)  Labs Reviewed  CBC - Abnormal; Notable for the following:    MCH 35.5 (*)     MCHC 36.7 (*)  PRE-WARMING TECHNIQUE USED   Platelets 118 (*)     All other components within normal limits  COMPREHENSIVE METABOLIC PANEL - Abnormal; Notable for the following:    Sodium 133 (*)     Chloride 95 (*)     Glucose, Bld 196 (*)     AST 105 (*)     ALT 71 (*)     All other components within normal limits  ETHANOL - Abnormal; Notable for the following:    Alcohol, Ethyl (B) 335 (*)     All other components within normal limits  SALICYLATE LEVEL - Abnormal; Notable for the following:    Salicylate Lvl <1.6 (*)     All other components within  normal limits  ACETAMINOPHEN LEVEL  URINE RAPID DRUG SCREEN (HOSP PERFORMED)   No results found.   No diagnosis found.    MDM  6:49 PM Patient will move to psych ED. ACT team consulted.         Alvina Chou, PA-C 01/27/12 1858

## 2012-01-27 NOTE — ED Notes (Signed)
To ED via [facility] from [facility] on [facility] with c/o wanting detox, wants to go to rehab-- admits to drinking 6 pack today. No SI/ HI

## 2012-01-28 MED ORDER — INSULIN GLARGINE 100 UNIT/ML ~~LOC~~ SOLN: 12.0000 [IU] | Freq: Every day | SUBCUTANEOUS | Status: DC

## 2012-01-28 MED ORDER — METFORMIN HCL ER 750 MG PO TB24: 750.0000 mg | ORAL_TABLET | Freq: Two times a day (BID) | ORAL | Status: DC

## 2012-01-28 NOTE — [facility] Counselor (Signed)
TC with Lori @ [facility]. Needed updated labs, vitals, Rx given and CIWA scores. Informed her that we have arranged to provide pt with Rx for Lantis & Glucophage, but currently awaiting pharmacy to fill this. Stated they would review this info and pt must have the Rx accessible. Stated that they do have someone who can pick up patient up until 10 pm, but will need to get scheduled prior to this time.

## 2012-01-28 NOTE — Progress Notes (Signed)
[facility] ED CM consulted by ACT member, Santiago Glad about medication assistance.  CM spoke with Angela Nevin in [place] to find out that pt is not eligible for [facility] medication assistance program Pt last used program on 01/10/12 after 12/21/11-01/10/12 [facility] admission with a d/c to [facility] program with a vial of Lantus, a bus pass and a train ticket.  ACT member updated

## 2012-01-28 NOTE — ED Notes (Signed)
Pt is laying down but says he is now nauseated and started to go through withdrawals and needs ativan and diet sprite, told him his RN would be notified.

## 2012-01-28 NOTE — ED Notes (Signed)
Pt. C/O increased anxiety.  Medication given.

## 2012-01-28 NOTE — Progress Notes (Signed)
[facility] ED CM had a call from [facility] pharmacy staff to discuss that pt had recently received medication assistance and inquiring about over ride for further medications. CM confirmed over ride provided by [employer]

## 2012-01-28 NOTE — Progress Notes (Signed)
[facility] ED CM received a voice message from a director of [facility] about medications for pt CM spoke with CM assistant director, Delanna Ahmadi who provided over ride for further medication assistance to pt for metformin and lantus.  Tubed rx to [facility] pharmacy for processing [facility] RN, [facility] team member, Santiago Glad and EDP, Ray updated Pending response from [place] to [facility] RN. CM signed off

## 2012-01-28 NOTE — [facility] Counselor (Signed)
This Probation officer spoke with Pat([facility]), says facility will not accept pt w/o diabetic meds.  This Probation officer talked with pt and says he ran out and meds and didn't bother to have them filled.  This Probation officer asked pt why he didn't refill meds--"I just didn't". Pt says he was given diabetic script that cost $300 but stated that [facility] was going to fill another diabetic script for $4.  Pt says he maybe able to find someone to pick up script and bring it to the hosp.  This writer advised again that facility would not accept him for detox unless he had meds.

## 2012-01-28 NOTE — ED Provider Notes (Signed)
Patient detox  Patient but needs meds to be filled by Case Management.    Prescriptions written and patient to be transferred to rehab in [place] when filled.   Shaune Pollack, MD 01/28/12 1438

## 2012-01-29 ENCOUNTER — Encounter (HOSPITAL_COMMUNITY): Payer: Self-pay | Admitting: *Deleted

## 2012-01-29 ENCOUNTER — Inpatient Hospital Stay (HOSPITAL_COMMUNITY)
Admission: AD | Admit: 2012-01-29 | Discharge: 2012-02-02 | DRG: 897 | Disposition: A | Discharge status: Home / Self Care | Payer: Federal, State, Local not specified - Other | Source: Other Acute Inpatient Hospital | Attending: Psychiatry | Admitting: Psychiatry

## 2012-01-29 DIAGNOSIS — Z794 Long term (current) use of insulin: Secondary | ICD-10-CM

## 2012-01-29 DIAGNOSIS — Z79899 Other long term (current) drug therapy: Secondary | ICD-10-CM

## 2012-01-29 DIAGNOSIS — E119 Type 2 diabetes mellitus without complications: Secondary | ICD-10-CM | POA: Diagnosis present

## 2012-01-29 DIAGNOSIS — F10229 Alcohol dependence with intoxication, unspecified: Principal | ICD-10-CM | POA: Diagnosis present

## 2012-01-29 DIAGNOSIS — F29 Unspecified psychosis not due to a substance or known physiological condition: Secondary | ICD-10-CM

## 2012-01-29 DIAGNOSIS — B1920 Unspecified viral hepatitis C without hepatic coma: Secondary | ICD-10-CM | POA: Diagnosis present

## 2012-01-29 DIAGNOSIS — F1020 Alcohol dependence, uncomplicated: Secondary | ICD-10-CM | POA: Diagnosis present

## 2012-01-29 DIAGNOSIS — E871 Hypo-osmolality and hyponatremia: Secondary | ICD-10-CM | POA: Diagnosis present

## 2012-01-29 DIAGNOSIS — F1910 Other psychoactive substance abuse, uncomplicated: Secondary | ICD-10-CM

## 2012-01-29 DIAGNOSIS — K7030 Alcoholic cirrhosis of liver without ascites: Secondary | ICD-10-CM | POA: Diagnosis present

## 2012-01-29 DIAGNOSIS — Z23 Encounter for immunization: Secondary | ICD-10-CM

## 2012-01-29 LAB — GLUCOSE, CAPILLARY: Glucose-Capillary: 141 mg/dL — ABNORMAL HIGH (ref 70–99)

## 2012-01-29 MED ORDER — ADULT MULTIVITAMIN W/MINERALS CH
1.0000 | ORAL_TABLET | Freq: Every day | ORAL | Status: DC
  Administered 2012-01-29 – 2012-02-02 (×5): 1 via ORAL
  Filled 2012-01-29 (×7): qty 1

## 2012-01-29 MED ORDER — METFORMIN HCL ER 750 MG PO TB24
750.0000 mg | ORAL_TABLET | Freq: Two times a day (BID) | ORAL | Status: DC
  Administered 2012-01-29 – 2012-02-02 (×9): 750 mg via ORAL
  Filled 2012-01-29 (×13): qty 1

## 2012-01-29 MED ORDER — ACETAMINOPHEN 325 MG PO TABS
650.0000 mg | ORAL_TABLET | Freq: Four times a day (QID) | ORAL | Status: DC | PRN
  Administered 2012-01-29 – 2012-01-30 (×2): 650 mg via ORAL

## 2012-01-29 MED ORDER — VITAMIN B-1 100 MG PO TABS
100.0000 mg | ORAL_TABLET | Freq: Every day | ORAL | Status: DC
  Administered 2012-01-30 – 2012-02-02 (×4): 100 mg via ORAL
  Filled 2012-01-29 (×6): qty 1

## 2012-01-29 MED ORDER — LOPERAMIDE HCL 2 MG PO CAPS: 2.0000 mg | ORAL_CAPSULE | ORAL | Status: AC | PRN

## 2012-01-29 MED ORDER — ONDANSETRON 4 MG PO TBDP: 4.0000 mg | ORAL_TABLET | Freq: Four times a day (QID) | ORAL | Status: AC | PRN

## 2012-01-29 MED ORDER — MAGNESIUM HYDROXIDE 400 MG/5ML PO SUSP: 30.0000 mL | Freq: Every day | ORAL | Status: DC | PRN

## 2012-01-29 MED ORDER — PNEUMOCOCCAL VAC POLYVALENT 25 MCG/0.5ML IJ INJ
0.5000 mL | INJECTION | Freq: Once | INTRAMUSCULAR | Status: AC
  Administered 2012-01-29: 0.5 mL via INTRAMUSCULAR

## 2012-01-29 MED ORDER — THIAMINE HCL 100 MG/ML IJ SOLN: 100.0000 mg | Freq: Once | INTRAMUSCULAR | Status: DC

## 2012-01-29 MED ORDER — HYDROXYZINE HCL 25 MG PO TABS
25.0000 mg | ORAL_TABLET | Freq: Four times a day (QID) | ORAL | Status: AC | PRN
  Administered 2012-01-29 – 2012-01-31 (×5): 25 mg via ORAL
  Filled 2012-01-29: qty 1

## 2012-01-29 MED ORDER — CARBAMAZEPINE 200 MG PO TABS
200.0000 mg | ORAL_TABLET | Freq: Three times a day (TID) | ORAL | Status: DC
  Administered 2012-01-29 – 2012-02-02 (×14): 200 mg via ORAL
  Filled 2012-01-29 (×19): qty 1

## 2012-01-29 MED ORDER — INSULIN GLARGINE 100 UNIT/ML ~~LOC~~ SOLN
12.0000 [IU] | Freq: Every day | SUBCUTANEOUS | Status: DC
  Administered 2012-01-29 – 2012-02-01 (×4): 12 [IU] via SUBCUTANEOUS
  Filled 2012-01-29: qty 10

## 2012-01-29 MED ORDER — LORAZEPAM 1 MG PO TABS
1.0000 mg | ORAL_TABLET | Freq: Four times a day (QID) | ORAL | Status: DC | PRN
  Administered 2012-01-29 – 2012-02-01 (×11): 1 mg via ORAL
  Filled 2012-01-29 (×11): qty 1

## 2012-01-29 MED ORDER — INFLUENZA VIRUS VACC SPLIT PF IM SUSP
0.5000 mL | Freq: Once | INTRAMUSCULAR | Status: AC
  Administered 2012-01-29: 0.5 mL via INTRAMUSCULAR

## 2012-01-29 NOTE — [facility] Counselor (Signed)
Pt accepted to [facility] by Dr. Gilford Rile. EDP notified and agrees with plan. Support paperwork has been signed and faxed to [facility].

## 2012-01-29 NOTE — Progress Notes (Signed)
Adult Psychosocial Assessment Update Interdisciplinary Team  Previous [facility] admissions/discharges:  Admissions Discharges  Date:01/30/2011 Date:02/04/2011  Date:11/06/2011 Date:11/11/2011  Date:12/21/2011 Date:12/25/2011  Date: Date:  Date: Date:   Changes since the last Psychosocial Assessment (including adherence to outpatient mental health and/or substance abuse treatment, situational issues contributing to decompensation and/or relapse). Went to a place that was supposed to be ETOH free but said that many were drinking there so he then went to a motel for work.  Once work fell through, he began to drink again and that is how he ended up here.             Discharge Plan 1. Will you be returning to the same living situation after discharge?   Yes: No:   X   If no, what is your plan?    Would like to look into [facility] or [facility].  Would like a tx program.  Michela Pitcher that he has done [facility] before.       2. Would you like a referral for services when you are discharged? Yes: X    If yes, for what services?  No:       Pt stated he would like to be referred to a tx program that is approx. 30 days long.       Summary and Recommendations (to be completed by the evaluator) Recommendations include crisis stabilization, case management, medication management, psycho-education groups to teach coping skills and group therapy.  Pt also would benefit from critically examining his choice of tx program and length of program.                       Signature:  Einar Gip, 01/29/2012 3:11 PM

## 2012-01-29 NOTE — Tx Team (Signed)
Initial Interdisciplinary Treatment Plan  PATIENT STRENGTHS: (choose at least two) Ability for insight Average or above average intelligence Communication skills General fund of knowledge Motivation for treatment/growth Supportive family/friends  PATIENT STRESSORS: Financial difficulties Occupational concerns Substance abuse   PROBLEM LIST: Problem List/Patient Goals Date to be addressed Date deferred Reason deferred Estimated date of resolution  Substance abusee 01/29/12     depression 01/29/12     anxiety 01/29/12     Home placement 01/29/12                                    DISCHARGE CRITERIA:  Ability to meet basic life and health needs Improved stabilization in mood, thinking, and/or behavior Motivation to continue treatment in a less acute level of care Verbal commitment to aftercare and medication compliance Withdrawal symptoms are absent or subacute and managed without 24-hour nursing intervention  PRELIMINARY DISCHARGE PLAN: Attend aftercare/continuing care group Attend 12-step recovery group Outpatient therapy Participate in family therapy Placement in alternative living arrangements  PATIENT/FAMIILY INVOLVEMENT: This treatment plan has been presented to and reviewed with the patient, CLARION MOONEYHAN.  The patient and family have been given the opportunity to ask questions and make suggestions.  Judie Petit 01/29/2012, 3:29 AM

## 2012-01-29 NOTE — Progress Notes (Signed)
Psychoeducational Group Note  Date:  01/29/2012 Time:  0315  Group Topic/Focus:  Healthy Communication:   The focus of this group is to discuss communication, barriers to communication, as well as healthy ways to communicate with others.  Participation Level:  Did Not Attend  Joneen Caraway 01/29/2012, 7:28 PM

## 2012-01-29 NOTE — Progress Notes (Signed)
D has relapsed to drinking after not getting a job is sleeping poorly at [employer], d/t WD s/s, appetite is poor, energy level is low and ability to pay attention is imporving, is depressed and hopeless, WD s/s tremors, chilling and agitation, denies Si or HI, and c/o headaches A q33mn safety checks continue and support offered, is not attending group although he stated to MD he would attend group, he is wanting long term treatment R patient remains safe on the unit, encouraged him to attend group and participate, patient not very responsive to this statment

## 2012-01-29 NOTE — Progress Notes (Signed)
Patient ID: [name], male   DOB: 1956/04/27, 56 y.o.   MRN: [id number]   [facility] Group Notes:  (Counselor/Nursing/MHT/Case Management/Adjunct)  01/29/2012 1:15 PM  Type of Therapy:  Group Therapy, Dance/Movement Therapy   Participation Level:  Did Not Attend   [name] 01/29/2012. 2:58 PM

## 2012-01-29 NOTE — Progress Notes (Signed)
Psychoeducational Group Note  Date:  01/29/2012 Time:1000am  Group Topic/Focus:  Identifying Needs:   The focus of this group is to help patients identify their personal needs that have been historically problematic and identify healthy behaviors to address their needs.  Participation Level:  Did Not Attend  Participation Quality:  Additional Comments:   [name] 01/29/2012,11:00 AM

## 2012-01-29 NOTE — Progress Notes (Signed)
Patient attended the last 15 minutes of the  evening speaker [place] meeting. Pt was in bed for most of meeting.

## 2012-01-29 NOTE — [facility] Suicide Risk Assessment (Signed)
Suicide Risk Assessment  Admission Assessment     Nursing information obtained from:  Patient Demographic factors:  Male;Caucasian;Low socioeconomic status;Unemployed Current Mental Status:  NA Loss Factors:  Decrease in vocational status Historical Factors:  NA Risk Reduction Factors:  NA  CLINICAL FACTORS:   Depression:   Anhedonia Impulsivity Insomnia Severe Alcohol/Substance Abuse/Dependencies Currently Psychotic  COGNITIVE FEATURES THAT CONTRIBUTE TO RISK:  Closed-mindedness Polarized thinking    SUICIDE RISK:   Mild:  Suicidal ideation of limited frequency, intensity, duration, and specificity.  There are no identifiable plans, no associated intent, mild dysphoria and related symptoms, good self-control (both objective and subjective assessment), few other risk factors, and identifiable protective factors, including available and accessible social support.  PLAN OF CARE: Patient was admitted due to relapse into his drinking.  He was recently discharged from rehabilitation Center.  He start drinking again.  2 days ago his alcohol level was more than 300.  We will start Ativan protocol due to the increased liver enzyme.  He would require stabilization.  He will participate in group milieu therapy.  Please see complete admission note.  [name] Zentner T. 01/29/2012, 12:30 PM

## 2012-01-29 NOTE — H&P (Signed)
Psychiatric Admission Assessment Adult  Patient Identification:  [name] Date of Evaluation:  01/29/2012 Chief Complaint:  Alcohol Dependence History of Present Illness: Here 8/13-8/17/13 for same . Was to go to 64 meetings in 90 days and has been to [facility] ince discharge doesn't know the exact dates. Today is saying that he needs long term SA treatment. Case manager needs to work with his PO to determine what program would meet the courts requirements.  Past Psychiatric History: Diagnosis:Alcohol Dependence   Hospitalizations:Numerous   Outpatient Care:doesn't stay outpatient long enough to establish  Substance Abuse Care: presents for detox frequently   Self-Mutilation:No   Suicidal Attempts:None   Violent Behaviors:None    Past Medical History:   Past Medical History  Diagnosis Date  . Hepatitis C   . Diabetes mellitus   . Chronic hyponatremia   . Alcoholic   . Alcoholic cirrhosis of liver   . Thrombocytopenia   . Hyperglycemia    None. Allergies:   Allergies  Allergen Reactions  . Sulfa Antibiotics   . Librium (Chlordiazepoxide Hcl) Anxiety   PTA Medications: Prescriptions prior to admission  Medication Sig Dispense Refill  . insulin glargine (LANTUS) 100 UNIT/ML injection Inject 12 Units into the skin at bedtime.  10 mL  1  . metFORMIN (GLUCOPHAGE-XR) 750 MG 24 hr tablet Take 1 tablet (750 mg total) by mouth 2 (two) times daily.  60 tablet  1    Previous Psychotropic Medications:  Medication/Dose                 Substance Abuse History in the last 12 months: Substance Age of 1st Use Last Use Amount Specific Type  Nicotine      Alcohol      Cannabis      Opiates      Cocaine      Methamphetamines      LSD      Ecstasy      Benzodiazepines      Caffeine      Inhalants      Others:                         Consequences of Substance Abuse: Medical Consequences:  alcoholic cirrhosis  Legal Consequences:  has a PO due to felony bad  checks  Withdrawal Symptoms:   Diarrhea Headaches Nausea Tremors  Social History: Please see old chart for details   Family History:  History reviewed. No pertinent family history.  Mental Status Examination/Evaluation: Objective:  Appearance: Fairly Groomed  Engineer, water::  Fair  Speech:  Clear and Coherent and Normal Rate  Volume:  Normal  Mood:  Anxious and Irritable  Affect:  Congruent  Thought Process:  Goal Directed, Linear and Logical  Orientation:  Full  Thought Content:  No AVH/psychosis   Suicidal Thoughts:  No  Homicidal Thoughts:  No  Memory:  Immediate;   Good  Judgement:  Intact  Insight:  Present  Psychomotor Activity:  Normal  Concentration:  Good  Recall:  Good  Akathisia:  No  Handed:  Right  AIMS (if indicated):     Assets:  Communication Skills Physical Health Resilience  Sleep:  Number of Hours: 0.25     Laboratory/X-Ray Psychological Evaluation(s)      Assessment:    AXIS I:  Alcohol dependence   AXIS II:  Deferred AXIS III:   Past Medical History  Diagnosis Date  . Hepatitis C   .  Diabetes mellitus   . Chronic hyponatremia   . Alcoholic   . Alcoholic cirrhosis of liver   . Thrombocytopenia   . Hyperglycemia    AXIS IV:  economic problems, occupational problems, problems related to legal system/crime and problems with primary support group AXIS V:  51-60 moderate symptoms  Treatment Plan/Recommendations:  Treatment Plan Summary: Daily contact with patient to assess and evaluate symptoms and progress in treatment Medication management Current Medications:  Current Facility-Administered Medications  Medication Dose Route Frequency Provider Last Rate Last Dose  . acetaminophen (TYLENOL) tablet 650 mg  650 mg Oral Q6H PRN Darrol Jump, MD      . carbamazepine (TEGRETOL) tablet 200 mg  200 mg Oral TID Darrol Jump, MD   200 mg at 01/29/12 1200  . hydrOXYzine (ATARAX/VISTARIL) tablet 25 mg  25 mg Oral Q6H PRN Darrol Jump, MD    25 mg at 01/29/12 0830  . influenza  inactive virus vaccine (FLUZONE/FLUARIX) injection 0.5 mL  0.5 mL Intramuscular Once Darrol Jump, MD   0.5 mL at 01/29/12 7494  . insulin glargine (LANTUS) injection 12 Units  12 Units Subcutaneous QHS Darrol Jump, MD      . loperamide (IMODIUM) capsule 2-4 mg  2-4 mg Oral PRN Darrol Jump, MD      . LORazepam (ATIVAN) tablet 1 mg  1 mg Oral Q6H PRN [name] D. Jatavis Malek, PA   1 mg at 01/29/12 1200  . magnesium hydroxide (MILK OF MAGNESIA) suspension 30 mL  30 mL Oral Daily PRN Darrol Jump, MD      . metFORMIN (GLUCOPHAGE-XR) 24 hr tablet 750 mg  750 mg Oral BID WC Darrol Jump, MD   750 mg at 01/29/12 0821  . multivitamin with minerals tablet 1 tablet  1 tablet Oral Daily Darrol Jump, MD   1 tablet at 01/29/12 [phone]  . ondansetron (ZOFRAN-ODT) disintegrating tablet 4 mg  4 mg Oral Q6H PRN Darrol Jump, MD      . pneumococcal 23 valent vaccine (PNU-IMMUNE) injection 0.5 mL  0.5 mL Intramuscular Once Darrol Jump, MD   0.5 mL at 01/29/12 0832  . thiamine (B-1) injection 100 mg  100 mg Intramuscular Once Darrol Jump, MD      . thiamine (VITAMIN B-1) tablet 100 mg  100 mg Oral Daily Darrol Jump, MD       Facility-Administered Medications Ordered in Other Encounters  Medication Dose Route Frequency Provider Last Rate Last Dose  . DISCONTD: acetaminophen (TYLENOL) tablet 650 mg  650 mg Oral Q4H PRN Leota Jacobsen, MD      . DISCONTD: alum & mag hydroxide-simeth (MAALOX/MYLANTA) 200-200-20 MG/5ML suspension 30 mL  30 mL Oral PRN Leota Jacobsen, MD      . DISCONTD: folic acid (FOLVITE) tablet 1 mg  1 mg Oral Daily Leota Jacobsen, MD   1 mg at 01/28/12 0933  . DISCONTD: ibuprofen (ADVIL,MOTRIN) tablet 600 mg  600 mg Oral Q8H PRN Leota Jacobsen, MD      . DISCONTD: insulin glargine (LANTUS) injection 12 Units  12 Units Subcutaneous QHS Leota Jacobsen, MD   12 Units at 01/28/12 2140  . DISCONTD: LORazepam (ATIVAN) injection 1 mg  1 mg Intravenous  Q6H PRN Leota Jacobsen, MD      . DISCONTD: LORazepam (ATIVAN) tablet 0-4 mg  0-4 mg Oral Q6H Leota Jacobsen, MD   1 mg at 01/28/12 2026  .  DISCONTD: LORazepam (ATIVAN) tablet 0-4 mg  0-4 mg Oral Q12H Leota Jacobsen, MD      . DISCONTD: LORazepam (ATIVAN) tablet 1 mg  1 mg Oral Q6H PRN Leota Jacobsen, MD   1 mg at 01/29/12 0224  . DISCONTD: metFORMIN (GLUCOPHAGE-XR) 24 hr tablet 750 mg  750 mg Oral BID WC Leota Jacobsen, MD   750 mg at 01/28/12 1725  . DISCONTD: multivitamin with minerals tablet 1 tablet  1 tablet Oral Daily Leota Jacobsen, MD   1 tablet at 01/28/12 [phone]  . DISCONTD: nicotine (NICODERM CQ - dosed in mg/24 hours) patch 21 mg  21 mg Transdermal Daily Leota Jacobsen, MD   21 mg at 01/27/12 2024  . DISCONTD: ondansetron (ZOFRAN) tablet 4 mg  4 mg Oral Q8H PRN Leota Jacobsen, MD   4 mg at 01/28/12 1756  . DISCONTD: thiamine (B-1) injection 100 mg  100 mg Intravenous Daily Leota Jacobsen, MD      . DISCONTD: thiamine (VITAMIN B-1) tablet 100 mg  100 mg Oral Daily Leota Jacobsen, MD   100 mg at 01/28/12 0933    Observation Level/Precautions:  Detox  Laboratory:    Psychotherapy:    Medications:    Routine PRN Medications:  Yes  Consultations:    Discharge Concerns:  Need to coordinate with Court and PO   Other:     [name],[name] D. 9/21/20132:53 PM

## 2012-01-29 NOTE — Progress Notes (Signed)
Patient ID: [name], male   DOB: Dec 14, 1955, 56 y.o.   MRN: [id number]  Pt did not attend aftercare planning group.

## 2012-01-29 NOTE — Progress Notes (Signed)
Patient ID: [name], male   DOB: 05-Nov-1955, 56 y.o.   MRN: [id number] Pt admitted voluntarily to [facility] for detox from ETOH.  Pt stated he was here a few weeks ago for detox.  Pt stated that since discharge he has had problems with living arrangements and finding consistent employment which was a stressor for him.  Pt stated he began drinking again d/t the stress.  Pt states he is unsure of where he will go after discharge.  He stated that he had begun to live in a hotel but because he is unemployed, he will not have the money to return.  Pt verbalized that he would like to go to a long term facility.  Pt denied SI, HI and AVH.   Pt has history of DM and Hep C.  Fifteen minute checks initiated.   Pt safe on unit.

## 2012-01-30 LAB — GLUCOSE, CAPILLARY
Glucose-Capillary: 187 mg/dL — ABNORMAL HIGH (ref 70–99)
Glucose-Capillary: 171 mg/dL — ABNORMAL HIGH (ref 70–99)

## 2012-01-30 NOTE — Progress Notes (Signed)
Psychoeducational Group Note  Date:  01/30/2012 Time:  1000am  Group Topic/Focus:  Making Healthy Choices:   The focus of this group is to help patients identify negative/unhealthy choices they were using prior to admission and identify positive/healthier coping strategies to replace them upon discharge.  Participation Level:  Did Not Attend  Participation Quality:    Affect:   Additional Comments:   [name] 01/30/2012,11:24 AM

## 2012-01-30 NOTE — Progress Notes (Signed)
Patient ID: [name], male   DOB: 1955-08-02, 56 y.o.   MRN: [id number]  Pt did not attend aftercare planning group.

## 2012-01-30 NOTE — Progress Notes (Signed)
Patient ID: [name], male   DOB: Dec 15, 1955, 56 y.o.   MRN: [id number]   [facility] Group Notes:  (Counselor/Nursing/MHT/Case Management/Adjunct)  01/30/2012 1:15 PM  Type of Therapy:  Group Therapy, Dance/Movement Therapy   Participation Level:  Did Not Attend   [name] 01/30/2012. 2:47 PM

## 2012-01-30 NOTE — Progress Notes (Signed)
Psychoeducational Group Note  Date:  01/30/2012 Time:  0300  Group Topic/Focus:  Goals Group:   The focus of this group is to help patients establish daily goals to achieve during treatment and discuss how the patient can incorporate goal setting into their daily lives to aide in recovery.  Participation Level:  Minimal  Participation Quality:  Appropriate, Attentive and Resistant  Affect:  Appropriate and Flat  Cognitive:  Alert and Appropriate  Engagement in Group:  None  Additional Comments:  Pt attended and participated in group discussing goal setting. Pts participation was minimal but he did complete the activity that was given during group to make at least 1 goal that they would like to complete in the next year using the guidelines covered in group.   Joneen Caraway 01/30/2012, 6:58 PM

## 2012-01-30 NOTE — Progress Notes (Signed)
Report received from [facility]. Writer entered patients room at 2340 and patient was lying in bed awake and reported that he was waiting on an Ativan for anxiety. Patient informed of time medication could be given and he is agreeable. Support and encouragement offered, safety maintained with 15 min checks, will continue to monitor.

## 2012-01-30 NOTE — Progress Notes (Signed)
[facility] Group Notes:  (Counselor/Nursing/MHT/Case Management/Adjunct)  01/30/2012 10:32 PM  Type of Therapy:  AA  Participation Level:  Active  Participation Quality:  Appropriate  Affect:  Appropriate  Cognitive:  Appropriate  Insight:  Good  Engagement in Group:  Good  Engagement in Therapy:  Good  Modes of Intervention:  Support  Summary of Progress/Problems: Veto attended [employer].  Dene Gentry 01/30/2012, 10:32 PM

## 2012-01-30 NOTE — Progress Notes (Signed)
Patient ID: [name], male   DOB: 1956/04/30, 56 y.o.   MRN: [id number] 01-30-12 @ 1351:nursing shift note: D: pt is not going to any group or participating in the milieu. A: RN has attempted to stimulate pt to go to groups and will continue to encourage him: R:pt contracted with rn to go to groups.  pt has missed all am groups and remains in bed. rn will monitor and q 15 min cks continue.

## 2012-01-30 NOTE — Progress Notes (Signed)
  [name] is a 56 y.o. male [id number] 1956/04/17  01/29/2012 Principal Problem:  *Alcohol dependence Active Problems:  Alcohol dependence with acute alcoholic intoxication   Mental Status:    Subjective/Objective:    Filed Vitals:   01/30/12 1101  BP: 110/74  Pulse: 108  Temp:   Resp:     Lab Results:   BMET    Component Value Date/Time   NA 133* 01/27/2012 1552   K 4.2 01/27/2012 1552   CL 95* 01/27/2012 1552   CO2 26 01/27/2012 1552   GLUCOSE 196* 01/27/2012 1552   BUN 7 01/27/2012 1552   CREATININE 0.55 01/27/2012 1552   CALCIUM 8.8 01/27/2012 1552   GFRNONAA >90 01/27/2012 1552   GFRAA >90 01/27/2012 1552    Medications:  Scheduled:     . carbamazepine  200 mg Oral TID  . insulin glargine  12 Units Subcutaneous QHS  . metFORMIN  750 mg Oral BID WC  . multivitamin with minerals  1 tablet Oral Daily  . thiamine  100 mg Intramuscular Once  . thiamine  100 mg Oral Daily     PRN Meds acetaminophen, hydrOXYzine, loperamide, LORazepam, magnesium hydroxide, ondansetron   [name],MICKIE D. 01/30/2012    [name] is a 56 y.o. male [id number] 12-30-1955  01/29/2012 Principal Problem:  *Alcohol dependence Active Problems:  Alcohol dependence with acute alcoholic intoxication   Mental Status: Mood is self pitying denies SI/HI/AVH.   Subjective/Objective: Says his flu shot has caused him total body pain. Not doing what he needs to do.   Filed Vitals:   01/30/12 1101  BP: 110/74  Pulse: 108  Temp:   Resp:     Lab Results:   BMET    Component Value Date/Time   NA 133* 01/27/2012 1552   K 4.2 01/27/2012 1552   CL 95* 01/27/2012 1552   CO2 26 01/27/2012 1552   GLUCOSE 196* 01/27/2012 1552   BUN 7 01/27/2012 1552   CREATININE 0.55 01/27/2012 1552   CALCIUM 8.8 01/27/2012 1552   GFRNONAA >90 01/27/2012 1552   GFRAA >90 01/27/2012 1552    Medications:  Scheduled:     . carbamazepine  200 mg Oral TID  . insulin glargine  12 Units  Subcutaneous QHS  . metFORMIN  750 mg Oral BID WC  . multivitamin with minerals  1 tablet Oral Daily  . thiamine  100 mg Intramuscular Once  . thiamine  100 mg Oral Daily     PRN Meds acetaminophen, hydrOXYzine, loperamide, LORazepam, magnesium hydroxide, ondansetron  Plan: Continue current plan of care- can be discharged.  [name],MICKIE D. 01/30/2012

## 2012-01-31 DIAGNOSIS — F1020 Alcohol dependence, uncomplicated: Secondary | ICD-10-CM

## 2012-01-31 LAB — CARBAMAZEPINE LEVEL, TOTAL: Carbamazepine Lvl: 7.9 ug/mL (ref 4.0–12.0)

## 2012-01-31 LAB — GLUCOSE, CAPILLARY: Glucose-Capillary: 201 mg/dL — ABNORMAL HIGH (ref 70–99)

## 2012-01-31 MED ORDER — NICOTINE POLACRILEX 2 MG MT GUM
2.0000 mg | CHEWING_GUM | OROMUCOSAL | Status: DC | PRN
  Administered 2012-01-31: 2 mg via ORAL
  Filled 2012-01-31: qty 1

## 2012-01-31 NOTE — Progress Notes (Signed)
  [facility] Group Notes:  (Counselor/Nursing/MHT/Case Management/Adjunct)    Type of Therapy:  Group Therapy at 1:15 to 2:30  Participation Level:  Active during limited time  he was in group  Participation Quality:  Intrusive and Monopolizing  Affect:  Irritable  Cognitive:  Oriented  Insight:  None shared  Engagement in Group:  Limited  Engagement in Therapy:  None  Modes of Intervention:  Clarification, Limit-setting and Support  Summary of Progress/Problems: Group discussion focused on what patient's see as their own obstacles to recovery.  Patient shared belief that desire to drink will be difficult to deal with especially in midst of his friends.    [name] 01/31/2012,   4:14 PM

## 2012-01-31 NOTE — Progress Notes (Signed)
[facility] MD Progress Note  01/31/2012 3:23 PM  S: "I have been here sinceThursday last week. I am here for alcohol detox. It was rough the first 2 days because the withdrawal symptoms were horrible. All I did was sweat, sweat, sweat. But I'm feeling a lot better now. I am hoping for a long term treatment Center that will not take me out of state. I am on probation for signing bad checks. My probation officer told me that I can't leave the state because I have to work to pay back the money for being on probation".  Diagnosis:   Axis I: Alcohol dependence with acute alcoholic intoxication Axis II: Deferred Axis III:  Past Medical History  Diagnosis Date  . Hepatitis C   . Diabetes mellitus   . Chronic hyponatremia   . Alcoholic   . Alcoholic cirrhosis of liver   . Thrombocytopenia   . Hyperglycemia    Axis IV: other psychosocial or environmental problems Axis V: 51-60 moderate symptoms  ADL's:  Intact  Sleep: Good  Appetite:  Good  Suicidal Ideation: "No" Plan:  No Intent:  No Means:  No Homicidal Ideation:  Plan:  No Intent:  No Means:  No  AEB (as evidenced by): Per patient's report  Mental Status Examination/Evaluation: Objective:  Appearance: Casual  Eye Contact::  Good  Speech:  Clear and Coherent  Volume:  Normal  Mood:  "Feeling a lot better"  Affect:  Appropriate  Thought Process:  Coherent  Orientation:  Full  Thought Content:  Hallucinations: denies  Suicidal Thoughts:  No  Homicidal Thoughts:  No  Memory:  Immediate;   Good Recent;   Good Remote;   Good  Judgement:  Good  Insight:  Good  Psychomotor Activity:  Normal  Concentration:  Good  Recall:  Good  Akathisia:  No  Handed:  Right  AIMS (if indicated):     Assets:  Desire for Improvement  Sleep:  Number of Hours: 2.5    Vital Signs:Blood pressure 101/66, pulse 106, temperature 99.7 F (37.6 C), temperature source Oral, resp. rate 16, height 5' 11" (1.803 m), weight 74.844 kg (165 lb). Current  Medications: Current Facility-Administered Medications  Medication Dose Route Frequency Provider Last Rate Last Dose  . acetaminophen (TYLENOL) tablet 650 mg  650 mg Oral Q6H PRN Darrol Jump, MD   650 mg at 01/30/12 0511  . carbamazepine (TEGRETOL) tablet 200 mg  200 mg Oral TID Darrol Jump, MD   200 mg at 01/31/12 1157  . hydrOXYzine (ATARAX/VISTARIL) tablet 25 mg  25 mg Oral Q6H PRN Darrol Jump, MD   25 mg at 01/31/12 0815  . insulin glargine (LANTUS) injection 12 Units  12 Units Subcutaneous QHS Darrol Jump, MD   12 Units at 01/30/12 2126  . loperamide (IMODIUM) capsule 2-4 mg  2-4 mg Oral PRN Darrol Jump, MD      . LORazepam (ATIVAN) tablet 1 mg  1 mg Oral Q6H PRN Mickie D. Adams, PA   1 mg at 01/31/12 1213  . magnesium hydroxide (MILK OF MAGNESIA) suspension 30 mL  30 mL Oral Daily PRN Darrol Jump, MD      . metFORMIN (GLUCOPHAGE-XR) 24 hr tablet 750 mg  750 mg Oral BID WC Darrol Jump, MD   750 mg at 01/31/12 0814  . multivitamin with minerals tablet 1 tablet  1 tablet Oral Daily Darrol Jump, MD   1 tablet at 01/31/12 [phone]  .  ondansetron (ZOFRAN-ODT) disintegrating tablet 4 mg  4 mg Oral Q6H PRN Darrol Jump, MD      . thiamine (B-1) injection 100 mg  100 mg Intramuscular Once Darrol Jump, MD      . thiamine (VITAMIN B-1) tablet 100 mg  100 mg Oral Daily Darrol Jump, MD   100 mg at 01/31/12 0813    Lab Results:  Results for orders placed during the hospital encounter of 01/29/12 (from the past 48 hour(s))  GLUCOSE, CAPILLARY     Status: Abnormal   Collection Time   01/29/12  5:32 PM      Component Value Range Comment   Glucose-Capillary 123 (*) 70 - 99 mg/dL   GLUCOSE, CAPILLARY     Status: Abnormal   Collection Time   01/30/12  7:45 AM      Component Value Range Comment   Glucose-Capillary 232 (*) 70 - 99 mg/dL   GLUCOSE, CAPILLARY     Status: Abnormal   Collection Time   01/30/12  5:18 PM      Component Value Range Comment   Glucose-Capillary 187  (*) 70 - 99 mg/dL   GLUCOSE, CAPILLARY     Status: Abnormal   Collection Time   01/30/12  8:48 PM      Component Value Range Comment   Glucose-Capillary 171 (*) 70 - 99 mg/dL   GLUCOSE, CAPILLARY     Status: Abnormal   Collection Time   01/31/12  6:02 AM      Component Value Range Comment   Glucose-Capillary 156 (*) 70 - 99 mg/dL   CARBAMAZEPINE LEVEL, TOTAL     Status: Normal   Collection Time   01/31/12  6:15 AM      Component Value Range Comment   Carbamazepine Lvl 7.9  4.0 - 12.0 ug/mL     Physical Findings: AIMS: Facial and Oral Movements Muscles of Facial Expression: None, normal Lips and Perioral Area: None, normal Jaw: None, normal Tongue: None, normal,Extremity Movements Upper (arms, wrists, hands, fingers): None, normal Lower (legs, knees, ankles, toes): None, normal, Trunk Movements Neck, shoulders, hips: None, normal, Overall Severity Severity of abnormal movements (highest score from questions above): None, normal Incapacitation due to abnormal movements: None, normal Patient's awareness of abnormal movements (rate only patient's report): No Awareness, Dental Status Current problems with teeth and/or dentures?: No Does patient usually wear dentures?: No  CIWA:  CIWA-Ar Total: 4  COWS:  COWS Total Score: 0   Treatment Plan Summary: Daily contact with patient to assess and evaluate symptoms and progress in treatment Medication management  Plan: No new changes made on the current treatment regimen. Continue current treatment plan.  Lindell Spar I 01/31/2012, 3:23 PM

## 2012-01-31 NOTE — Progress Notes (Signed)
Pt did not attend d/c planning group on this date.  SW met with pt individually at this time.  Pt presents with disheveled appearance, flat affect and depressed mood.  Pt rates depression and anxiety at an 8 today.  Pt denies SI.  Pt reports coming here to detox from alcohol.  Pt states that he continues to relapse and will have to go to jail if he doesn't get help.  Pt states that he is currently on probation for forging a check, but reports he didn't do it.  Pt requesting SW to contact his probation officer, April Taylor, to inform her that pt is in the hospital.  SW will contact PO today.  Pt states that he wants to go to [facility] for further treatment.  SW secured pt's follow up appointment at [facility] for 10/4.  Pt states that he has been staying in [place] in a hotel and has transportation home.  No further needs voiced by pt at this time.   [name] Lemming, [place] 01/31/2012  12:35 PM

## 2012-01-31 NOTE — Progress Notes (Signed)
Patient ID: [name], male   DOB: [birth year], 56 y.o.   MRN: [id number] He has been up and to groups interacting with peers and staff. Has been agitated  At times today, interrupting group, with loud talking.  Denies being depressed and hopeless.  Had requested and did receive ativan for anxiety today, calmer after that.

## 2012-01-31 NOTE — Progress Notes (Signed)
Pt up to the med window requesting Ativan for anxiety.  Reports he is having difficulty getting to sleep.  Pt received Ativan 1 mg per request.  Pt reports he is still feeling moderate withdrawal symptoms.  He denies SI/HI/AV.  Pt voices no other needs/concerns at this time.  Safety maintained with q15 minute checks.

## 2012-02-01 DIAGNOSIS — F1010 Alcohol abuse, uncomplicated: Secondary | ICD-10-CM

## 2012-02-01 LAB — GLUCOSE, CAPILLARY
Glucose-Capillary: 183 mg/dL — ABNORMAL HIGH (ref 70–99)
Glucose-Capillary: 234 mg/dL — ABNORMAL HIGH (ref 70–99)

## 2012-02-01 MED ORDER — INSULIN ASPART 100 UNIT/ML ~~LOC~~ SOLN
0.0000 [IU] | Freq: Three times a day (TID) | SUBCUTANEOUS | Status: DC
  Administered 2012-02-01: 5 [IU] via SUBCUTANEOUS
  Administered 2012-02-01: 3 [IU] via SUBCUTANEOUS
  Administered 2012-02-02: 2 [IU] via SUBCUTANEOUS
  Administered 2012-02-02: 3 [IU] via SUBCUTANEOUS

## 2012-02-01 MED ORDER — TRAZODONE HCL 50 MG PO TABS
50.0000 mg | ORAL_TABLET | Freq: Every evening | ORAL | Status: DC | PRN
  Administered 2012-02-01 (×2): 50 mg via ORAL
  Filled 2012-02-01 (×2): qty 1

## 2012-02-01 NOTE — Progress Notes (Signed)
Psychoeducational Group Note  Date:  02/01/2012 Time:  1100  Group Topic/Focus:  Recovery Goals:   The focus of this group is to identify appropriate goals for recovery and establish a plan to achieve them.  Participation Level:  Active  Participation Quality:  Appropriate, Attentive and Sharing  Affect:  Appropriate  Cognitive:  Alert and Appropriate  Insight:  Good  Engagement in Group:  Good  Additional Comments:  Pt was appropriate and attentive while attending group. Pt shared that he needs to find a full time job and a place to live. Pt  Communicated that when his job is not doing well he drinking because he becomes stressed out. Pt was encouraged to speak with case-worker about long term treatment that allows him to work.   Elenor Legato 02/01/2012, 1:51 PM

## 2012-02-01 NOTE — Progress Notes (Signed)
Pt attended discharge planning group and actively participated in group.  SW provided pt with today's workbook.  Pt presents with irritable affect and mood.  Pt rates depression at a 0 and anxiety at a 2-3 today.  Pt denies SI/HI.  Pt reports not wanting to d/c out of [facility] unless he is going straight to treatment.  SW informed pt he can go to [facility] on 10/4.  Pt states that he needs to have a place to stay until then.  Pt asked about [facility].  SW provided pt with an application to [facility] and made the referral today.  No further needs voiced by pt at this time.    [name] Lemming, LCSWA 02/01/2012  10:43 AM

## 2012-02-01 NOTE — Progress Notes (Signed)
I agree with this note.

## 2012-02-01 NOTE — Progress Notes (Signed)
Patient ID: [name], male   DOB: 1956-02-09, 56 y.o.   MRN: [id number]  D:  When asked about his day pt replied, "There's a big problem. I have anxiety because they don't want to tell me nothing. Like if I'm going to [facility]". However, several minutes into the conversation pt admitted that his counselor was trying to get him into [facility].  Assure the pt that when they have information they'll pass it on to him. Then pt stated he'd rather wait at [facility] until adm to [facility], which is his first choice.   A:  Informed pt info would be documented. Offered support and encouragement. 15 min checks continued for safety.   R:  Pt voiced no other concerns or questions, and remains safe.

## 2012-02-01 NOTE — Progress Notes (Signed)
Patient ID: [name], male   DOB: Oct 06, 1955, 56 y.o.   MRN: [id number] He has been pleasant and cooperative, up for most of the groups. Interacting with peers and staff. He Has not  Requested  Any prn medications today. Was started on sliding scale insulin to day. He said that when he left he was going to resume how he was giving it prior to  His admission here.

## 2012-02-01 NOTE — Progress Notes (Addendum)
[facility] MD Progress Note  02/01/2012 5:01 PM  Diagnosis:  Axis I: alcohol abuse Axis II: Deferred  At discharge I am planning to go to a good impatient program.  I have been in multiple programs but they were unsuccessful. One program was a slave labor camp in [place]."  ADL's:  Intact- Fair  Sleep: Good "sleeping too much"  Appetite:  Fair "improving but still back to normal" Depression- 2/10 Hopeless- 0/10 Anxiety-3/10  Suicidal Ideation:  Plan:  none Intent:  none Means:  none  Homicidal Ideation:  Plan:  none Intent:  none Means:  none  AEB (as evidenced by):  ROS: Neuro- negative for tremors  GI- +nausea  Mental Status Examination/Evaluation: Objective:  Appearance: Fairly Groomed  Eye Contact::  Fair  Speech:  Clear and Coherent  Volume:  Decreased  Mood:  Depressed;   Affect:  Constricted  Thought Process:  Linear;   Orientation:  Full  Thought Content:  Paranoid Ideation - "I dont want to tell you where I work".  Suicidal Thoughts:  No  Homicidal Thoughts:  No  Memory:  Immediate;   Fair  Judgement:  Fair  Insight:  Lacking  Psychomotor Activity:  Normal  Concentration:  Fair  Recall:  Fair  Akathisia:  No  Handed:    AIMS (if indicated):     Assets:  Vocational/Educational  Sleep:  Number of Hours: 3.75    Vital Signs:Blood pressure 137/95, pulse 75, temperature 97.5 F (36.4 C), temperature source Oral, resp. rate 16, height 5' 11" (1.803 m), weight 74.844 kg (165 lb). Current Medications: Current Facility-Administered Medications  Medication Dose Route Frequency Provider Last Rate Last Dose  . acetaminophen (TYLENOL) tablet 650 mg  650 mg Oral Q6H PRN Darrol Jump, MD   650 mg at 01/30/12 0511  . carbamazepine (TEGRETOL) tablet 200 mg  200 mg Oral TID Darrol Jump, MD   200 mg at 02/01/12 1215  . hydrOXYzine (ATARAX/VISTARIL) tablet 25 mg  25 mg Oral Q6H PRN Darrol Jump, MD   25 mg at 01/31/12 0815  . insulin aspart (novoLOG) injection  0-15 Units  0-15 Units Subcutaneous TID WC Encarnacion Slates, NP   3 Units at 02/01/12 1218  . insulin glargine (LANTUS) injection 12 Units  12 Units Subcutaneous QHS Darrol Jump, MD   12 Units at 01/31/12 2112  . loperamide (IMODIUM) capsule 2-4 mg  2-4 mg Oral PRN Darrol Jump, MD      . magnesium hydroxide (MILK OF MAGNESIA) suspension 30 mL  30 mL Oral Daily PRN Darrol Jump, MD      . metFORMIN (GLUCOPHAGE-XR) 24 hr tablet 750 mg  750 mg Oral BID WC Darrol Jump, MD   750 mg at 02/01/12 0820  . multivitamin with minerals tablet 1 tablet  1 tablet Oral Daily Darrol Jump, MD   1 tablet at 02/01/12 0820  . nicotine polacrilex (NICORETTE) gum 2 mg  2 mg Oral PRN Darrol Jump, MD   2 mg at 01/31/12 2005  . ondansetron (ZOFRAN-ODT) disintegrating tablet 4 mg  4 mg Oral Q6H PRN Darrol Jump, MD      . thiamine (B-1) injection 100 mg  100 mg Intramuscular Once Darrol Jump, MD      . thiamine (VITAMIN B-1) tablet 100 mg  100 mg Oral Daily Darrol Jump, MD   100 mg at 02/01/12 7408  . DISCONTD: LORazepam (ATIVAN) tablet 1 mg  1  mg Oral Q6H PRN Mickie D. Andree Elk, PA   1 mg at 02/01/12 8099    Lab Results:  Results for orders placed during the hospital encounter of 01/29/12 (from the past 48 hour(s))  GLUCOSE, CAPILLARY     Status: Abnormal   Collection Time   01/30/12  5:18 PM      Component Value Range Comment   Glucose-Capillary 187 (*) 70 - 99 mg/dL   GLUCOSE, CAPILLARY     Status: Abnormal   Collection Time   01/30/12  8:48 PM      Component Value Range Comment   Glucose-Capillary 171 (*) 70 - 99 mg/dL   GLUCOSE, CAPILLARY     Status: Abnormal   Collection Time   01/31/12  6:02 AM      Component Value Range Comment   Glucose-Capillary 156 (*) 70 - 99 mg/dL   CARBAMAZEPINE LEVEL, TOTAL     Status: Normal   Collection Time   01/31/12  6:15 AM      Component Value Range Comment   Carbamazepine Lvl 7.9  4.0 - 12.0 ug/mL   GLUCOSE, CAPILLARY     Status: Abnormal   Collection  Time   01/31/12  5:29 PM      Component Value Range Comment   Glucose-Capillary 201 (*) 70 - 99 mg/dL   GLUCOSE, CAPILLARY     Status: Abnormal   Collection Time   02/01/12  6:05 AM      Component Value Range Comment   Glucose-Capillary 110 (*) 70 - 99 mg/dL   GLUCOSE, CAPILLARY     Status: Abnormal   Collection Time   02/01/12 12:08 PM      Component Value Range Comment   Glucose-Capillary 183 (*) 70 - 99 mg/dL     Physical Findings: AIMS: Facial and Oral Movements Muscles of Facial Expression: None, normal Lips and Perioral Area: None, normal Jaw: None, normal Tongue: None, normal, Extremity Movements Upper (arms, wrists, hands, fingers): None, normal Lower (legs, knees, ankles, toes): None, normal, Trunk Movements- none Neck, shoulders, hips: None, normal,  Overall Severity Severity of abnormal movements (highest score from questions above): None, normal Incapacitation due to abnormal movements: None, normal Patient's awareness of abnormal movements (rate only patient's report): No Awareness,  Dental Status Current problems with teeth and/or dentures?: No Does patient usually wear dentures?: No  CIWA:  CIWA-Ar Total: 1  COWS:  COWS Total Score: 0   Treatment Plan Summary: Daily contact with patient to assess and evaluate symptoms and progress in treatment  Medication management  Plan: Continue same medications without change.   Reece Packer FNP-BC 02/01/2012, 5:01 PM

## 2012-02-01 NOTE — Progress Notes (Signed)
[facility] Group Notes:  (Counselor/Nursing/MHT/Case Management/Adjunct)  02/01/2012 5:00 PM  Type of Therapy:  Psychoeducational Skills  Participation Level:  Active  Participation Quality:  Appropriate, Attentive, Redirectable and Resistant  Affect:  Appropriate  Cognitive:  Alert, Appropriate and Oriented  Insight:  Good  Engagement in Group:  Limited  Engagement in Therapy:  n/a  Modes of Intervention:  Activity, Education, Problem-solving, Socialization and Support  Summary of Progress/Problems: [name] attended Psychoeducational group that focused on using quality time with support systems/individuals to engage in healthy coping skills. [name] participated in activity guessing about self and peers. [name] was resistant at first but was redirectable and became active while group discussed who their supports are, how they can spend positive quality time with them as a coping skills and a way to strengthen their relationship. [name] was given a homework assignment to find two ways to improve their support systems and twenty activities they can do to spend quality time with their supports.    [name] 02/01/2012, 5:00 PM

## 2012-02-02 LAB — HEMOGLOBIN A1C
Hgb A1c MFr Bld: 7.2 % — ABNORMAL HIGH (ref <5.7)
Mean Plasma Glucose: 160 mg/dL — ABNORMAL HIGH (ref <117)

## 2012-02-02 MED ORDER — TRAZODONE HCL 100 MG PO TABS
100.0000 mg | ORAL_TABLET | Freq: Every evening | ORAL | Status: DC | PRN
  Filled 2012-02-02 (×3): qty 28

## 2012-02-02 MED ORDER — INSULIN GLARGINE 100 UNIT/ML ~~LOC~~ SOLN: 12.0000 [IU] | Freq: Every day | SUBCUTANEOUS | Status: DC

## 2012-02-02 MED ORDER — METFORMIN HCL ER 750 MG PO TB24: 750.0000 mg | ORAL_TABLET | Freq: Two times a day (BID) | ORAL | Status: DC

## 2012-02-02 MED ORDER — TRAZODONE HCL 50 MG PO TABS: 100.0000 mg | ORAL_TABLET | Freq: Every evening | ORAL | Status: DC | PRN

## 2012-02-02 MED ORDER — CARBAMAZEPINE 200 MG PO TABS: 200.0000 mg | ORAL_TABLET | Freq: Three times a day (TID) | ORAL | Status: DC

## 2012-02-02 NOTE — [facility] Suicide Risk Assessment (Signed)
Suicide Risk Assessment  Discharge Assessment     Demographic Factors:  Male, Caucasian, Low socioeconomic status, Living alone and Unemployed  Mental Status Per Nursing Assessment::   On Admission:  NA  Current Mental Status by Physician: No suicidal ideations  Loss Factors: Decrease in vocational status and Financial problems/change in socioeconomic status  Historical Factors: Impulsivity  Risk Reduction Factors:   NA  Continued Clinical Symptoms:  Alcohol/Substance Abuse/Dependencies  Discharge Diagnoses:   AXIS I:  Alcohol Abuse AXIS II:  No diagnosis AXIS III:   Past Medical History  Diagnosis Date  . Hepatitis C   . Diabetes mellitus   . Chronic hyponatremia   . Alcoholic   . Alcoholic cirrhosis of liver   . Thrombocytopenia   . Hyperglycemia    AXIS IV:  economic problems, housing problems and other psychosocial or environmental problems AXIS V:  61-70 mild symptoms  Cognitive Features That Contribute To Risk:  Thought constriction (tunnel vision)    Suicide Risk:  Minimal: No identifiable suicidal ideation.  Patients presenting with no risk factors but with morbid ruminations; may be classified as minimal risk based on the severity of the depressive symptoms  Plan Of Care/Follow-up recommendations:  Activity:  normal,  Diet: regular Discontinue Tegretol, patient started on this medication for detox. Prescribe Trazodone 141m po prn qd for insomnia. [facility]  Cesar Rogerson 02/02/2012, 1:09 PM

## 2012-02-02 NOTE — Discharge Summary (Signed)
Physician Discharge Summary Note  Patient:  [name] is an 56 y.o., male MRN:  [id number] DOB:  1955/06/11 Patient phone:  [phone] (home)  Patient address:   [address] [place] [address],   Date of Admission:  01/29/2012 Date of Discharge: 02/02/12  Reason for Admission: Alcohol detoxification.  Discharge Diagnoses: Principal Problem:  *Alcohol dependence Active Problems:  Alcohol dependence with acute alcoholic intoxication   Axis Diagnosis:   AXIS I:  Alcohol dependence with acute alcoholic intoxication AXIS II:  Deferred AXIS III:   Past Medical History  Diagnosis Date  . Hepatitis C   . Diabetes mellitus   . Chronic hyponatremia   . Alcoholic   . Alcoholic cirrhosis of liver   . Thrombocytopenia   . Hyperglycemia    AXIS IV:  other psychosocial or environmental problems AXIS V:  65  Level of Care:  [facility]  Hospital Course:  Here 8/13-8/17/13 for same . Was to go to 76 meetings in 90 days and has been to [facility] ince discharge doesn't know the exact dates. Today is saying that he needs long term SA treatment. Case manager needs to work with his PO to determine what program would meet the courts requirements.   After admission assessment and evaluation, it was determined that patient will need detoxification treatment. And his discharge plans included a referral to a long term treatment facility for more intense substance abuse treatment. [name] was then started on Librium protocol for his alcohol detoxification. He was also enrolled in group counseling sessions and activities to learn coping skills that will him help after discharge to cope better, manage his substance abuse problems to maintain a much longer sobriety. He also was enrolled and attended AA/NA meetings being offered and held on this unit. He has some previous and or identifiable medical conditions that required treatment and or monitoring. He received medication management for all those  health issues as well. He was monitored closely for any potential problems that may arise as a result of and or during detoxification treatment. Patient tolerated his treatment regimen and detoxification treatment without any significant adverse effects and or reactions.  Patient attended treatment team meeting this am and met with the team. His symptoms, substance abuse issues, response to treatment and discharge plans discussed. Patient endorsed that he is doing well and stable for discharge to pursue the next phase of his substance abuse treatment.  He was encouraged to attend 59 AA meetings in 90 days, get a trusted sponsor from the advise of others or from whomever within the [place] meetings seems to make sense, and has a proven track record, and will hold him responsible for his sobriety, and both expects and insists on his total  abstinence from alcohol. [name] was instructed to do the steps honestly with the trusted sponsor.He is encouraged to get obsessed with recovery by often reminding himself of how deadly this dredged horrible disease of addiction just is. He must focus the first of each month on speaker meetings where he will specifically look at how his life has been wrecked by drugs/alcohol and how his life has been similar to that of the speaker's life.   It was agreed upon between patient and the team that he will be discharged to his [facility] for further substance abuse treatment. Upon discharge, patient adamantly denies suicidal, homicidal ideations, auditory, visual hallucinations, delusional thinking and or withdrawal symptoms. Patient left [facility] with all personal belongings in no apparent distress. He  received 2 weeks worth samples of his discharge medications. Transportation per [employer].    Consults:  None  Significant Diagnostic Studies:  labs: CBC with diff, CMP, Toxicology, UDS, HGBA1C, Tegretol levels.  Discharge Vitals:   Blood pressure 111/80, pulse 112, temperature 97.2 F (36.2  C), temperature source Oral, resp. rate 18, height 5' 11" (1.803 m), weight 74.844 kg (165 lb).  Physical Findings: AIMS: Facial and Oral Movements Muscles of Facial Expression: None, normal Lips and Perioral Area: None, normal Jaw: None, normal Tongue: None, normal,Extremity Movements Upper (arms, wrists, hands, fingers): None, normal Lower (legs, knees, ankles, toes): None, normal, Trunk Movements Neck, shoulders, hips: None, normal, Overall Severity Severity of abnormal movements (highest score from questions above): None, normal Incapacitation due to abnormal movements: None, normal Patient's awareness of abnormal movements (rate only patient's report): No Awareness, Dental Status Current problems with teeth and/or dentures?: No Does patient usually wear dentures?: No  CIWA:  CIWA-Ar Total: 0  COWS:  COWS Total Score: 0   Mental Status Exam: See Mental Status Examination and Suicide Risk Assessment completed by Attending Physician prior to discharge.  Discharge destination:  [facility]  Is patient on multiple antipsychotic therapies at discharge:  No   Has Patient had three or more failed trials of antipsychotic monotherapy by history:  No  Recommended Plan for Multiple Antipsychotic Therapies: NA     Medication List     As of 02/03/2012  9:06 AM    TAKE these medications      Indication    carbamazepine 200 MG tablet   Commonly known as: TEGRETOL   Take 1 tablet (200 mg total) by mouth 3 (three) times daily. For mood control       insulin glargine 100 UNIT/ML injection   Commonly known as: LANTUS   Inject 12 Units into the skin at bedtime. For blood sugar control       metFORMIN 750 MG 24 hr tablet   Commonly known as: GLUCOPHAGE-XR   Take 1 tablet (750 mg total) by mouth 2 (two) times daily. For blood sugar control       traZODone 50 MG tablet   Commonly known as: DESYREL   Take 2 tablets (100 mg total) by mouth at bedtime as needed and may repeat dose one time if  needed for sleep. For sleep          Follow-up Information    Follow up with [facility]. On 02/02/2012. ([facility] will pick up at 2:00 pm promptly)    Contact information:   [address] [place], [place] [address] [phone]         Follow-up recommendations:  Activity:  as tolerated Other:  Keep all scheduled follow-up appointments as recommended.    Comments:  Take all your medications as prescribed by your mental healthcare provider. Report any adverse effects and or reactions from your medicines to your outpatient provider promptly. Patient is instructed and cautioned to not engage in alcohol and or illegal drug use while on prescription medicines. In the event of worsening symptoms, patient is instructed to call the crisis hotline, 911 and or go to the nearest ED for appropriate evaluation and treatment of symptoms. Follow-up with your primary care provider for your other medical issues, concerns and or health care needs.     SignedLindell Spar I 02/03/2012, 9:06 AM

## 2012-02-02 NOTE — Progress Notes (Signed)
Pt attended discharge planning group and actively participated in group.  SW provided pt with today's workbook.  Pt presents with agitated affect and depressed mood.  Pt denies having depression and SI/HI.  Pt rates anxiety at a 4 today.  Pt applied for [facility] and the referral was sent in.  Pt is open to going to [facility] after leaving here, as pt is afraid he will relapse if he d/c to the street.  SW will assess for appropriate referrals.  No further needs voiced by pt at this time.    [name], [place] 02/02/2012  10:11 AM

## 2012-02-02 NOTE — Tx Team (Addendum)
Interdisciplinary Treatment Plan Update (Adult)  Date:  02/02/2012  Time Reviewed:  10:00 AM   Progress in Treatment: Attending groups: Yes Participating in groups:  Yes Taking medication as prescribed: Yes Tolerating medication:  Yes Family/Significant othe contact made:  Counselor assessing for appropriate contact Patient understands diagnosis:  Yes Discussing patient identified problems/goals with staff:  Yes Medical problems stabilized or resolved:  Yes Denies suicidal/homicidal ideation: Yes Issues/concerns per patient self-inventory:  None identified Other: N/A  New problem(s) identified: None Identified  Reason for Continuation of Hospitalization: Stable to d/c  Interventions implemented related to continuation of hospitalization: Stable to d/c  Additional comments: N/A  Estimated length of stay: D/C today  Discharge Plan: Pt is going to [facility] today for further treatment.    New goal(s): N/A  Review of initial/current patient goals per problem list:    1.  Goal(s): Address substance use  Met:  Yes  Target date: by discharge  As evidenced by: completed detox protocol and referred to appropriate treatment  2.  Goal (s): Reduce depressive and anxiety symptoms  Met:  Yes  Target date: by discharge  As evidenced by: Reducing depression from a 10 to a 3 as reported by pt.  Pt rates depression at a 0 and anxiety at a 4 today.    3.  Goal (s): Eliminate SI  Met:  Yes  Target date: by discharge  As evidenced by: Pt denying SI  Attendees: Patient:     Family:     Physician: Elvin So, MD 02/02/2012 10:00 AM   Nursing: Marilynne Halsted, RN 02/02/2012 10:00 AM   Case Manager:  [name] Lemming, [place] 02/02/2012  10:00 AM   Counselor:  Frutoso Chase, LCSWA 02/02/2012  10:00 AM   Other:  Clinton Sawyer, RN 02/02/2012 10:01 AM   Other:  Satira Sark, RN 02/02/2012 10:01 AM   Other:     Other:      Scribe for Treatment Team:   [name] Lemming 02/02/2012 10:00 AM

## 2012-02-02 NOTE — Progress Notes (Signed)
[facility] Case Management Discharge Plan:  Will you be returning to the same living situation after discharge: No. Pt is going for further treatment At discharge, do you have transportation home?:Yes,  [facility] will transport pt Do you have the ability to pay for your medications:Yes,  access to meds  Interagency Information:     Release of information consent forms completed and in the chart;  Patient's signature needed at discharge.  Patient to Follow up at:  Follow-up Information    Follow up with [facility]. On 02/02/2012. ([facility] will pick up at 2:00 pm promptly)    Contact information:   [address] [place], [place] [address] [phone]         Patient denies SI/HI:   Yes,  denies SI/HI    Safety Planning and Suicide Prevention discussed:  Yes,  discussed with pt  Barrier to discharge identified:No.  Summary and Recommendations: Pt attended discharge planning group and actively participated in group.  SW provided pt with today's workbook.  Pt presents with calm mood and affect.  Pt denies having depression and rates anxiety at a 4 today.  Pt denies SI/HI.  Pt reports feeling stable to d/c today.  No recommendations from SW.  No further needs voiced by pt.  Pt stable to discharge.     Ane Payment 02/02/2012, 1:28 PM

## 2012-02-02 NOTE — Progress Notes (Signed)
Patient ID: [name], male   DOB: 08/22/1955, 56 y.o.   MRN: [id number] Patient denies SI/HI and A/V hallucinations; reviewed AVS, prescriptions, and samples; patient received samples, prescriptions, and copy of AVS; consents were done; patient verbalized that he has all his belongings; patient had no complaints at this time an was ambulatory; patient was met in the lobby by the represenative from [facility]

## 2012-02-04 NOTE — Progress Notes (Signed)
Patient Discharge Instructions:  After Visit Summary (AVS):   Faxed to:  02/04/2012 Psychiatric Admission Assessment Note:   Faxed to:  02/04/2012 Suicide Risk Assessment - Discharge Assessment:   Faxed to:  02/04/2012 Faxed/Sent to the Next Level Care provider:  02/04/2012  Faxed to [facility] @ [place], Lorayne Bender, 02/04/2012, 2:17 PM

## 2012-08-28 ENCOUNTER — Encounter (HOSPITAL_COMMUNITY): Payer: Self-pay | Admitting: Emergency Medicine

## 2012-08-28 ENCOUNTER — Emergency Department (HOSPITAL_COMMUNITY)
Admission: EM | Admit: 2012-08-28 | Discharge: 2012-08-28 | Discharge status: Against Medical Advice | Payer: Self-pay | Attending: Emergency Medicine | Admitting: Emergency Medicine

## 2012-08-28 DIAGNOSIS — F1010 Alcohol abuse, uncomplicated: Secondary | ICD-10-CM | POA: Insufficient documentation

## 2012-08-28 NOTE — ED Notes (Signed)
Pt not in room or waiting room after 3 calls. Had previously expressed concern over missing work tomorrow.

## 2012-08-28 NOTE — ED Notes (Signed)
Pt here for detox ETOH today and has been drinking off and on for 7 days. Has been treated for ETOH and was sober for 7 months until this past week. Today he has had 1 beer. Pt cooperative and voluntary.

## 2012-08-29 ENCOUNTER — Emergency Department (HOSPITAL_COMMUNITY)
Admission: EM | Admit: 2012-08-29 | Discharge: 2012-08-29 | Disposition: A | Discharge status: Home / Self Care | Payer: Self-pay | Attending: Emergency Medicine | Admitting: Emergency Medicine

## 2012-08-29 ENCOUNTER — Encounter (HOSPITAL_COMMUNITY): Payer: Self-pay | Admitting: *Deleted

## 2012-08-29 DIAGNOSIS — Z8619 Personal history of other infectious and parasitic diseases: Secondary | ICD-10-CM | POA: Insufficient documentation

## 2012-08-29 DIAGNOSIS — Z8719 Personal history of other diseases of the digestive system: Secondary | ICD-10-CM | POA: Insufficient documentation

## 2012-08-29 DIAGNOSIS — F1010 Alcohol abuse, uncomplicated: Secondary | ICD-10-CM | POA: Insufficient documentation

## 2012-08-29 DIAGNOSIS — F17200 Nicotine dependence, unspecified, uncomplicated: Secondary | ICD-10-CM | POA: Insufficient documentation

## 2012-08-29 DIAGNOSIS — E1169 Type 2 diabetes mellitus with other specified complication: Secondary | ICD-10-CM | POA: Insufficient documentation

## 2012-08-29 DIAGNOSIS — Z794 Long term (current) use of insulin: Secondary | ICD-10-CM | POA: Insufficient documentation

## 2012-08-29 DIAGNOSIS — Z862 Personal history of diseases of the blood and blood-forming organs and certain disorders involving the immune mechanism: Secondary | ICD-10-CM | POA: Insufficient documentation

## 2012-08-29 DIAGNOSIS — Z79899 Other long term (current) drug therapy: Secondary | ICD-10-CM | POA: Insufficient documentation

## 2012-08-29 LAB — CBC
MCH: 33.4 pg (ref 26.0–34.0)
MCV: 92.1 fL (ref 78.0–100.0)
Platelets: 121 10*3/uL — ABNORMAL LOW (ref 150–400)
RDW: 13.2 % (ref 11.5–15.5)

## 2012-08-29 MED ORDER — LORAZEPAM 1 MG PO TABS: 1.0000 mg | ORAL_TABLET | Freq: Four times a day (QID) | ORAL | Status: DC | PRN

## 2012-08-29 MED ORDER — METFORMIN HCL 500 MG PO TABS: 750.0000 mg | ORAL_TABLET | Freq: Two times a day (BID) | ORAL | Status: DC

## 2012-08-29 MED ORDER — LORAZEPAM 1 MG PO TABS
1.0000 mg | ORAL_TABLET | Freq: Once | ORAL | Status: AC
  Administered 2012-08-29: 1 mg via ORAL
  Filled 2012-08-29: qty 1

## 2012-08-29 NOTE — ED Provider Notes (Signed)
History     CSN: [id number]  Arrival date & time 08/29/12  0904   First MD Initiated Contact with Patient 08/29/12 [phone]      Chief Complaint  Patient presents with  . Medical Clearance    HPI Pt reports long standing hx of ETOH abuse. Sober for past 7 months. 1 week ago relapsed. Wants help. No HI or SI. No CP or abd pain. No SOB. No hallucinating or seizures. Denies other drug abuse. Nothing worsens or improves symptoms. Would like ativan to "self detox" at home. Last drink was this AM.    Past Medical History  Diagnosis Date  . Hepatitis C   . Diabetes mellitus   . Chronic hyponatremia   . Alcoholic   . Alcoholic cirrhosis of liver   . Thrombocytopenia   . Hyperglycemia     Past Surgical History  Procedure Laterality Date  . Plate in lower right leg  2008    History reviewed. No pertinent family history.  History  Substance Use Topics  . Smoking status: Current Every Day Smoker -- 1.00 packs/day for 40 years    Types: Cigarettes  . Smokeless tobacco: Not on file  . Alcohol Use: 0.0 oz/week     Comment: 12 pk- to a case of beer a day      Review of Systems  All other systems reviewed and are negative.    Allergies  Librium and Sulfa antibiotics  Home Medications   Current Outpatient Rx  Name  Route  Sig  Dispense  Refill  . insulin glargine (LANTUS) 100 UNIT/ML injection   Subcutaneous   Inject 12 Units into the skin at bedtime. For blood sugar control   10 mL   1   . LORazepam (ATIVAN) 1 MG tablet   Oral   Take 1 tablet (1 mg total) by mouth every 6 (six) hours as needed (withdrawl symptoms).   15 tablet   0   . metFORMIN (GLUCOPHAGE) 500 MG tablet   Oral   Take 1.5 tablets (750 mg total) by mouth 2 (two) times daily with a meal.   60 tablet   1   . metFORMIN (GLUCOPHAGE-XR) 750 MG 24 hr tablet   Oral   Take 1 tablet (750 mg total) by mouth 2 (two) times daily. For blood sugar control   60 tablet   1     BP 144/81  Pulse 84   Temp(Src) 98.5 F (36.9 C) (Oral)  Resp 16  SpO2 98%  Physical Exam  Nursing note and vitals reviewed. Constitutional: He is oriented to person, place, and time. He appears well-developed and well-nourished.  HENT:  Head: Normocephalic and atraumatic.  Eyes: EOM are normal.  Neck: Normal range of motion.  Cardiovascular: Normal rate, regular rhythm, normal heart sounds and intact distal pulses.   Pulmonary/Chest: Effort normal and breath sounds normal. No respiratory distress.  Abdominal: Soft. He exhibits no distension. There is no tenderness.  Musculoskeletal: Normal range of motion.  Neurological: He is alert and oriented to person, place, and time.  Skin: Skin is warm and dry.  Psychiatric: He has a normal mood and affect. Judgment normal.    ED Course  Procedures (including critical care time)  Labs Reviewed  CBC - Abnormal; Notable for the following:    MCHC 36.3 (*)    Platelets 121 (*)    All other components within normal limits  GLUCOSE, CAPILLARY - Abnormal; Notable for the following:    Glucose-Capillary 215 (*)  All other components within normal limits   No results found.   1. Alcohol abuse       MDM  ETOH abuse. Wants to go home with short course of ativan. This is reasonable. Dc home in good condition. Understands to return to ER for new or worsening symptoms        Hoy Morn, MD 08/29/12 1006

## 2012-08-29 NOTE — ED Notes (Signed)
Pt reports was in ED yesterday for detox, but had to leave, came back today. Reports last detox 8 months ago. Pt had been clean for 7-8 months, started drinking last week. Last ETOH this morning. Hx of diabetes. Pt denies pain. Denies SI/HI, AH, VH.

## 2012-08-29 NOTE — ED Notes (Signed)
This nurse spoke with main lab and informed them that MD did not want labs done, pt to be DC home for self detox. Labs cancelled and this nurse informed that pt would not be charged for labs that were completed.

## 2012-08-29 NOTE — ED Notes (Signed)
Patient has one personal belonging bag.

## 2012-09-16 ENCOUNTER — Emergency Department (HOSPITAL_COMMUNITY)
Admission: EM | Admit: 2012-09-16 | Discharge: 2012-09-16 | Disposition: A | Discharge status: Home / Self Care | Payer: Self-pay | Attending: Emergency Medicine | Admitting: Emergency Medicine

## 2012-09-16 ENCOUNTER — Encounter (HOSPITAL_COMMUNITY): Payer: Self-pay | Admitting: *Deleted

## 2012-09-16 DIAGNOSIS — F17200 Nicotine dependence, unspecified, uncomplicated: Secondary | ICD-10-CM | POA: Insufficient documentation

## 2012-09-16 DIAGNOSIS — Z862 Personal history of diseases of the blood and blood-forming organs and certain disorders involving the immune mechanism: Secondary | ICD-10-CM | POA: Insufficient documentation

## 2012-09-16 DIAGNOSIS — E871 Hypo-osmolality and hyponatremia: Secondary | ICD-10-CM | POA: Insufficient documentation

## 2012-09-16 DIAGNOSIS — F1020 Alcohol dependence, uncomplicated: Secondary | ICD-10-CM | POA: Insufficient documentation

## 2012-09-16 DIAGNOSIS — K7030 Alcoholic cirrhosis of liver without ascites: Secondary | ICD-10-CM | POA: Insufficient documentation

## 2012-09-16 DIAGNOSIS — Z8619 Personal history of other infectious and parasitic diseases: Secondary | ICD-10-CM | POA: Insufficient documentation

## 2012-09-16 DIAGNOSIS — E119 Type 2 diabetes mellitus without complications: Secondary | ICD-10-CM | POA: Insufficient documentation

## 2012-09-16 DIAGNOSIS — Z79899 Other long term (current) drug therapy: Secondary | ICD-10-CM | POA: Insufficient documentation

## 2012-09-16 MED ORDER — LORAZEPAM 1 MG PO TABS: 1.0000 mg | ORAL_TABLET | Freq: Four times a day (QID) | ORAL | Status: DC | PRN

## 2012-09-16 NOTE — ED Provider Notes (Signed)
History    Pt reports long standing hx of ETOH abuse. Sober for past 7 months. 1 week ago relapsed. Wants help. No HI or SI. No CP or abd pain. No SOB. No hallucinating or seizures. Denies other drug abuse. Nothing worsens or improves symptoms. Would like ativan to "self detox" at home. Last drink was this AM  CSN: [id number]  Arrival date & time 09/16/12  2028   First MD Initiated Contact with Patient 09/16/12 2054      Chief Complaint  Patient presents with  . Medical Clearance    (Consider location/radiation/quality/duration/timing/severity/associated sxs/prior treatment) HPI  Past Medical History  Diagnosis Date  . Hepatitis C   . Diabetes mellitus   . Chronic hyponatremia   . Alcoholic   . Alcoholic cirrhosis of liver   . Thrombocytopenia   . Hyperglycemia     Past Surgical History  Procedure Laterality Date  . Plate in lower right leg  2008    History reviewed. No pertinent family history.  History  Substance Use Topics  . Smoking status: Current Every Day Smoker -- 1.00 packs/day for 40 years    Types: Cigarettes  . Smokeless tobacco: Not on file  . Alcohol Use: 0.0 oz/week     Comment: 12 pk- to a case of beer a day      Review of Systems  All systems reviewed and negative, other than as noted in HPI.   Allergies  Librium and Sulfa antibiotics  Home Medications   Current Outpatient Rx  Name  Route  Sig  Dispense  Refill  . LORazepam (ATIVAN) 1 MG tablet   Oral   Take 1 tablet (1 mg total) by mouth every 6 (six) hours as needed (withdrawl symptoms).   15 tablet   0   . metFORMIN (GLUCOPHAGE-XR) 750 MG 24 hr tablet   Oral   Take 1 tablet (750 mg total) by mouth 2 (two) times daily. For blood sugar control   60 tablet   1     BP 113/69  Pulse 93  Temp(Src) 97.9 F (36.6 C) (Oral)  Resp 20  SpO2 100%  Physical Exam  Nursing note and vitals reviewed. Constitutional: He is oriented to person, place, and time. He appears  well-developed and well-nourished. No distress.  HENT:  Head: Normocephalic and atraumatic.  Eyes: Conjunctivae are normal. Right eye exhibits no discharge. Left eye exhibits no discharge.  Neck: Neck supple.  Cardiovascular: Normal rate, regular rhythm and normal heart sounds.  Exam reveals no gallop and no friction rub.   No murmur heard. Pulmonary/Chest: Effort normal. No respiratory distress.  Coarse breath sounds b/l  Abdominal: Soft. He exhibits no distension. There is no tenderness.  Musculoskeletal: He exhibits no edema and no tenderness.  Neurological: He is alert and oriented to person, place, and time. No cranial nerve deficit. He exhibits normal muscle tone. Coordination normal.  Skin: Skin is warm and dry.  Psychiatric: He has a normal mood and affect. His behavior is normal. Thought content normal.    ED Course  Procedures (including critical care time)  Labs Reviewed - No data to display No results found.   1. Alcohol dependence       MDM  56yM requesting meds to help self detox. 2nd Ed visit for same in past month. Discussed role of ED and that needs to seek outpt care for help with etoh abuse if not interested in inpt detox. Resources provided.  Virgel Manifold, MD 09/20/12 [phone]

## 2012-09-16 NOTE — ED Notes (Signed)
Pt is requesting help with outpatient detox from ETOH. Pt states he cannot afford to take off work to go to inpatient detox but states that he cannot detox outpatient without medication because he is experiencing physical withdrawal symptoms.

## 2012-09-27 ENCOUNTER — Emergency Department (HOSPITAL_COMMUNITY)
Admission: EM | Admit: 2012-09-27 | Discharge: 2012-09-27 | Disposition: A | Discharge status: Other Acute Inpatient Hospital | Payer: Self-pay | Attending: Emergency Medicine | Admitting: Emergency Medicine

## 2012-09-27 ENCOUNTER — Encounter (HOSPITAL_COMMUNITY): Payer: Self-pay | Admitting: Emergency Medicine

## 2012-09-27 ENCOUNTER — Encounter (HOSPITAL_COMMUNITY): Payer: Self-pay | Admitting: *Deleted

## 2012-09-27 ENCOUNTER — Inpatient Hospital Stay (HOSPITAL_COMMUNITY)
Admission: AD | Admit: 2012-09-27 | Discharge: 2012-09-29 | DRG: 897 | Disposition: A | Discharge status: Home / Self Care | Payer: Federal, State, Local not specified - Other | Source: Intra-hospital | Attending: Psychiatry | Admitting: Psychiatry

## 2012-09-27 DIAGNOSIS — R259 Unspecified abnormal involuntary movements: Secondary | ICD-10-CM | POA: Insufficient documentation

## 2012-09-27 DIAGNOSIS — F17200 Nicotine dependence, unspecified, uncomplicated: Secondary | ICD-10-CM | POA: Insufficient documentation

## 2012-09-27 DIAGNOSIS — Z79899 Other long term (current) drug therapy: Secondary | ICD-10-CM

## 2012-09-27 DIAGNOSIS — E119 Type 2 diabetes mellitus without complications: Secondary | ICD-10-CM | POA: Insufficient documentation

## 2012-09-27 DIAGNOSIS — F1020 Alcohol dependence, uncomplicated: Principal | ICD-10-CM | POA: Diagnosis present

## 2012-09-27 DIAGNOSIS — B1920 Unspecified viral hepatitis C without hepatic coma: Secondary | ICD-10-CM | POA: Diagnosis present

## 2012-09-27 DIAGNOSIS — R109 Unspecified abdominal pain: Secondary | ICD-10-CM | POA: Insufficient documentation

## 2012-09-27 DIAGNOSIS — Z862 Personal history of diseases of the blood and blood-forming organs and certain disorders involving the immune mechanism: Secondary | ICD-10-CM | POA: Insufficient documentation

## 2012-09-27 DIAGNOSIS — Z8639 Personal history of other endocrine, nutritional and metabolic disease: Secondary | ICD-10-CM | POA: Insufficient documentation

## 2012-09-27 DIAGNOSIS — R112 Nausea with vomiting, unspecified: Secondary | ICD-10-CM | POA: Insufficient documentation

## 2012-09-27 DIAGNOSIS — F1010 Alcohol abuse, uncomplicated: Secondary | ICD-10-CM | POA: Insufficient documentation

## 2012-09-27 DIAGNOSIS — Z8719 Personal history of other diseases of the digestive system: Secondary | ICD-10-CM | POA: Insufficient documentation

## 2012-09-27 DIAGNOSIS — Z8619 Personal history of other infectious and parasitic diseases: Secondary | ICD-10-CM | POA: Insufficient documentation

## 2012-09-27 DIAGNOSIS — Z9289 Personal history of other medical treatment: Secondary | ICD-10-CM

## 2012-09-27 LAB — COMPREHENSIVE METABOLIC PANEL
ALT: 60 U/L — ABNORMAL HIGH (ref 0–53)
AST: 74 U/L — ABNORMAL HIGH (ref 0–37)
Alkaline Phosphatase: 102 U/L (ref 39–117)
CO2: 29 mEq/L (ref 19–32)
Calcium: 8.8 mg/dL (ref 8.4–10.5)
GFR calc Af Amer: >90 mL/min (ref >90)
GFR calc non Af Amer: >90 mL/min (ref >90)
Glucose, Bld: 138 mg/dL — ABNORMAL HIGH (ref 70–99)
Potassium: 4.1 mEq/L (ref 3.5–5.1)
Sodium: 134 mEq/L — ABNORMAL LOW (ref 135–145)
Total Protein: 7.5 g/dL (ref 6.0–8.3)

## 2012-09-27 LAB — GLUCOSE, CAPILLARY: Glucose-Capillary: 193 mg/dL — ABNORMAL HIGH (ref 70–99)

## 2012-09-27 LAB — CBC
Hemoglobin: 14.8 g/dL (ref 13.0–17.0)
Platelets: 82 10*3/uL — ABNORMAL LOW (ref 150–400)
RBC: 4.41 MIL/uL (ref 4.22–5.81)

## 2012-09-27 LAB — ETHANOL: Alcohol, Ethyl (B): <11 mg/dL (ref 0–11)

## 2012-09-27 LAB — RAPID URINE DRUG SCREEN, HOSP PERFORMED
Amphetamines: NOT DETECTED
Barbiturates: NOT DETECTED
Tetrahydrocannabinol: NOT DETECTED

## 2012-09-27 MED ORDER — THIAMINE HCL 100 MG/ML IJ SOLN: 100.0000 mg | Freq: Every day | INTRAMUSCULAR | Status: DC

## 2012-09-27 MED ORDER — VITAMIN B-1 100 MG PO TABS
100.0000 mg | ORAL_TABLET | Freq: Every day | ORAL | Status: DC
  Administered 2012-09-27: 100 mg via ORAL
  Filled 2012-09-27: qty 1

## 2012-09-27 MED ORDER — NICOTINE 21 MG/24HR TD PT24
21.0000 mg | MEDICATED_PATCH | Freq: Every day | TRANSDERMAL | Status: DC
  Filled 2012-09-27: qty 1

## 2012-09-27 MED ORDER — LORAZEPAM 1 MG PO TABS
1.0000 mg | ORAL_TABLET | Freq: Three times a day (TID) | ORAL | Status: AC
  Administered 2012-09-27: 1 mg via ORAL
  Filled 2012-09-27: qty 1

## 2012-09-27 MED ORDER — LORAZEPAM 2 MG/ML IJ SOLN: 1.0000 mg | Freq: Four times a day (QID) | INTRAMUSCULAR | Status: DC | PRN

## 2012-09-27 MED ORDER — IBUPROFEN 600 MG PO TABS: 600.0000 mg | ORAL_TABLET | Freq: Three times a day (TID) | ORAL | Status: DC | PRN

## 2012-09-27 MED ORDER — ALUM & MAG HYDROXIDE-SIMETH 200-200-20 MG/5ML PO SUSP: 30.0000 mL | ORAL | Status: DC | PRN

## 2012-09-27 MED ORDER — LORAZEPAM 1 MG PO TABS
1.0000 mg | ORAL_TABLET | Freq: Two times a day (BID) | ORAL | Status: DC
  Administered 2012-09-29: 1 mg via ORAL
  Filled 2012-09-27: qty 1

## 2012-09-27 MED ORDER — LORAZEPAM 1 MG PO TABS: 1.0000 mg | ORAL_TABLET | Freq: Every day | ORAL | Status: DC

## 2012-09-27 MED ORDER — LORAZEPAM 1 MG PO TABS
1.0000 mg | ORAL_TABLET | Freq: Four times a day (QID) | ORAL | Status: DC | PRN
  Administered 2012-09-27 (×2): 1 mg via ORAL
  Filled 2012-09-27 (×2): qty 1

## 2012-09-27 MED ORDER — NICOTINE 21 MG/24HR TD PT24
21.0000 mg | MEDICATED_PATCH | Freq: Every day | TRANSDERMAL | Status: DC
  Filled 2012-09-27 (×2): qty 1

## 2012-09-27 MED ORDER — LORAZEPAM 1 MG PO TABS
1.0000 mg | ORAL_TABLET | Freq: Four times a day (QID) | ORAL | Status: DC | PRN
  Administered 2012-09-28 – 2012-09-29 (×4): 1 mg via ORAL
  Filled 2012-09-27 (×4): qty 1

## 2012-09-27 MED ORDER — ADULT MULTIVITAMIN W/MINERALS CH
1.0000 | ORAL_TABLET | Freq: Every day | ORAL | Status: DC
  Administered 2012-09-27: 1 via ORAL
  Filled 2012-09-27: qty 1

## 2012-09-27 MED ORDER — ONDANSETRON HCL 4 MG PO TABS: 4.0000 mg | ORAL_TABLET | Freq: Three times a day (TID) | ORAL | Status: DC | PRN

## 2012-09-27 MED ORDER — METFORMIN HCL ER 750 MG PO TB24
750.0000 mg | ORAL_TABLET | Freq: Two times a day (BID) | ORAL | Status: DC
  Administered 2012-09-27 (×2): 750 mg via ORAL
  Filled 2012-09-27 (×4): qty 1

## 2012-09-27 MED ORDER — FOLIC ACID 1 MG PO TABS
1.0000 mg | ORAL_TABLET | Freq: Every day | ORAL | Status: DC
  Administered 2012-09-27: 1 mg via ORAL
  Filled 2012-09-27: qty 1

## 2012-09-27 MED ORDER — ACETAMINOPHEN 325 MG PO TABS
650.0000 mg | ORAL_TABLET | Freq: Four times a day (QID) | ORAL | Status: DC | PRN
  Administered 2012-09-28: 650 mg via ORAL

## 2012-09-27 MED ORDER — MAGNESIUM HYDROXIDE 400 MG/5ML PO SUSP: 30.0000 mL | Freq: Every day | ORAL | Status: DC | PRN

## 2012-09-27 MED ORDER — LORAZEPAM 1 MG PO TABS
1.0000 mg | ORAL_TABLET | Freq: Three times a day (TID) | ORAL | Status: AC
  Administered 2012-09-28 (×2): 1 mg via ORAL
  Filled 2012-09-27 (×2): qty 1

## 2012-09-27 MED ORDER — ZOLPIDEM TARTRATE 5 MG PO TABS: 5.0000 mg | ORAL_TABLET | Freq: Every evening | ORAL | Status: DC | PRN

## 2012-09-27 MED ORDER — ACETAMINOPHEN 325 MG PO TABS: 650.0000 mg | ORAL_TABLET | ORAL | Status: DC | PRN

## 2012-09-27 NOTE — Tx Team (Signed)
Initial Interdisciplinary Treatment Plan  PATIENT STRENGTHS: (choose at least two) Ability for insight Active sense of humor Average or above average intelligence Capable of independent living Communication skills General fund of knowledge Motivation for treatment/growth Physical Health Special hobby/interest Supportive family/friends Work skills  PATIENT STRESSORS: Financial difficulties Legal issue Occupational concerns Substance abuse   PROBLEM LIST: Problem List/Patient Goals Date to be addressed Date deferred Reason deferred Estimated date of resolution  "I here to get monitored and medication to prevent a seizure for alcohol withdrawal" 09/27/12     "Medication for 2 or 3 days at the most for physical for alcohol" 09/27/12           Substance abuse 09/27/12     Increased risk for suicide 09/27/12                              DISCHARGE CRITERIA:  Ability to meet basic life and health needs Adequate post-discharge living arrangements Improved stabilization in mood, thinking, and/or behavior Medical problems require only outpatient monitoring Motivation to continue treatment in a less acute level of care Need for constant or close observation no longer present Reduction of life-threatening or endangering symptoms to within safe limits Safe-care adequate arrangements made Verbal commitment to aftercare and medication compliance Withdrawal symptoms are absent or subacute and managed without 24-hour nursing intervention  PRELIMINARY DISCHARGE PLAN: Attend 12-step recovery group Outpatient therapy Return to previous living arrangement Return to previous work or school arrangements  PATIENT/FAMIILY INVOLVEMENT: This treatment plan has been presented to and reviewed with the patient, [name], and/or family member.  The patient and family have been given the opportunity to ask questions and make suggestions.  Wynonia Hazard [facility] 09/27/2012, 9:18 PM

## 2012-09-27 NOTE — Consult Note (Signed)
Reason for Consult:  Alcohol detox Referring Physician: Blanchie Dessert, MD   [name] is an 57 y.o. male.  HPI: Patient states that he needs detox from alcohol.  Patient states that he has tried it at home twice before but failed.  "I was going to detox at home but was afraid I would have a seizure if I did it alone.  Patient states that he started drinking alcohol at 57 yr old and has been drinking off and on since.  "I came in here because I want to live not die"  Patient denies SI/HI/AVH  Past Medical History  Diagnosis Date  . Hepatitis C   . Diabetes mellitus   . Chronic hyponatremia   . Alcoholic   . Alcoholic cirrhosis of liver   . Thrombocytopenia   . Hyperglycemia     Past Surgical History  Procedure Laterality Date  . Plate in lower right leg  2008    History reviewed. No pertinent family history.  Social History:  reports that he has been smoking Cigarettes.  He has a 40 pack-year smoking history. He does not have any smokeless tobacco history on file. He reports that  drinks alcohol. He reports that he does not use illicit drugs.  Allergies:  Allergies  Allergen Reactions  . Librium (Chlordiazepoxide Hcl) Anxiety  . Sulfa Antibiotics Rash    Medications: I have reviewed the patient's current medications.  Results for orders placed during the hospital encounter of 09/27/12 (from the past 48 hour(s))  URINE RAPID DRUG SCREEN (HOSP PERFORMED)     Status: None   Collection Time    09/27/12  6:38 AM      Result Value Range   Opiates NONE DETECTED  NONE DETECTED   Cocaine NONE DETECTED  NONE DETECTED   Benzodiazepines NONE DETECTED  NONE DETECTED   Amphetamines NONE DETECTED  NONE DETECTED   Tetrahydrocannabinol NONE DETECTED  NONE DETECTED   Barbiturates NONE DETECTED  NONE DETECTED   Comment:            DRUG SCREEN FOR MEDICAL PURPOSES     ONLY.  IF CONFIRMATION IS NEEDED     FOR ANY PURPOSE, NOTIFY LAB     WITHIN 5 DAYS.                LOWEST  DETECTABLE LIMITS     FOR URINE DRUG SCREEN     Drug Class       Cutoff (ng/mL)     Amphetamine      1000     Barbiturate      200     Benzodiazepine   341     Tricyclics       962     Opiates          300     Cocaine          300     THC              50  CBC     Status: Abnormal   Collection Time    09/27/12  6:55 AM      Result Value Range   WBC 5.0  4.0 - 10.5 K/uL   RBC 4.41  4.22 - 5.81 MIL/uL   Hemoglobin 14.8  13.0 - 17.0 g/dL   HCT 41.0  39.0 - 52.0 %   MCV 93.0  78.0 - 100.0 fL   MCH 33.6  26.0 - 34.0 pg  MCHC 36.1 (*) 30.0 - 36.0 g/dL   RDW 14.3  11.5 - 15.5 %   Platelets 82 (*) 150 - 400 K/uL   Comment: REPEATED TO VERIFY     SPECIMEN CHECKED FOR CLOTS     PLATELET COUNT CONFIRMED BY SMEAR  COMPREHENSIVE METABOLIC PANEL     Status: Abnormal   Collection Time    09/27/12  6:55 AM      Result Value Range   Sodium 134 (*) 135 - 145 mEq/L   Potassium 4.1  3.5 - 5.1 mEq/L   Chloride 95 (*) 96 - 112 mEq/L   CO2 29  19 - 32 mEq/L   Glucose, Bld 138 (*) 70 - 99 mg/dL   BUN 7  6 - 23 mg/dL   Creatinine, Ser 0.51  0.50 - 1.35 mg/dL   Calcium 8.8  8.4 - 10.5 mg/dL   Total Protein 7.5  6.0 - 8.3 g/dL   Albumin 3.5  3.5 - 5.2 g/dL   AST 74 (*) 0 - 37 U/L   ALT 60 (*) 0 - 53 U/L   Alkaline Phosphatase 102  39 - 117 U/L   Total Bilirubin 0.4  0.3 - 1.2 mg/dL   GFR calc non Af Amer >90  >90 mL/min   GFR calc Af Amer >90  >90 mL/min   Comment:            The eGFR has been calculated     using the CKD EPI equation.     This calculation has not been     validated in all clinical     situations.     eGFR's persistently     <90 mL/min signify     possible Chronic Kidney Disease.  ETHANOL     Status: Abnormal   Collection Time    09/27/12  6:55 AM      Result Value Range   Alcohol, Ethyl (B) 147 (*) 0 - 11 mg/dL   Comment:            LOWEST DETECTABLE LIMIT FOR     SERUM ALCOHOL IS 11 mg/dL     FOR MEDICAL PURPOSES ONLY  GLUCOSE, CAPILLARY     Status: Abnormal    Collection Time    09/27/12  6:56 AM      Result Value Range   Glucose-Capillary 134 (*) 70 - 99 mg/dL   Comment 1 Documented in Chart     Comment 2 Notify RN    ETHANOL     Status: None   Collection Time    09/27/12  1:08 PM      Result Value Range   Alcohol, Ethyl (B) <11  0 - 11 mg/dL   Comment:            LOWEST DETECTABLE LIMIT FOR     SERUM ALCOHOL IS 11 mg/dL     FOR MEDICAL PURPOSES ONLY    No results found.  Review of Systems  Constitutional: Negative.   HENT: Negative.   Eyes: Negative.   Respiratory: Negative.   Cardiovascular: Negative.   Gastrointestinal: Positive for nausea.  Genitourinary: Negative.   Musculoskeletal: Negative.   Skin: Negative.   Neurological: Negative.   Endo/Heme/Allergies: Negative.   Psychiatric/Behavioral: Positive for depression and substance abuse. Negative for suicidal ideas and hallucinations. The patient is not nervous/anxious.    Blood pressure 160/83, pulse 83, temperature 99.1 F (37.3 C), temperature source Oral, resp. rate 16, SpO2 91.00%. Physical Exam  Constitutional:  He is oriented to person, place, and time. He appears well-developed and well-nourished.  HENT:  Head: Normocephalic and atraumatic.  Right Ear: External ear normal.  Left Ear: External ear normal.  Nose: Nose normal.  Eyes: Conjunctivae are normal. Pupils are equal, round, and reactive to light.  Neck: Normal range of motion. Neck supple.  Cardiovascular: Normal rate, regular rhythm and normal heart sounds.   Respiratory: Effort normal and breath sounds normal.  GI: Soft. Bowel sounds are normal. There is no tenderness.  Musculoskeletal: Normal range of motion.  Lymphadenopathy:    He has no cervical adenopathy.  Neurological: He is alert and oriented to person, place, and time. No cranial nerve deficit.  Skin: Skin is warm and dry.  Psychiatric: His speech is normal and behavior is normal. Cognition and memory are normal. He exhibits a depressed  mood. He expresses no homicidal and no suicidal ideation.  Patient denies SI/HI/AVH    Assessment/Plan: Recommendation for inpatient detox Information sent to [facility] for admission for detox Will continue with current treatment plan   Shuvon B. Rankin FNP-BC Family Nurse Practitioner, Board Certified  [place], [place] 09/27/2012, 2:37 PM

## 2012-09-27 NOTE — ED Notes (Signed)
Patient denies SI, HI and AVH at discharge time. No acute distress noted.

## 2012-09-27 NOTE — ED Notes (Signed)
[place] off [facility] with c/o "alcohol withdrawal". Per EMS, pt called EMS because he feels he is having "alcohol withdrawal"; pt reports that he has been drinking for 2 weeks, states he needs help with his "alcoholism" otherwise it will kill him; pt seeks alcohol detox.

## 2012-09-27 NOTE — ED Provider Notes (Signed)
History     CSN: [id number]  Arrival date & time 09/27/12  2025   First MD Initiated Contact with Patient 09/27/12 [phone]      Chief Complaint  Patient presents with  . Alcohol Problem    (Consider location/radiation/quality/duration/timing/severity/associated sxs/prior treatment) Patient is a 57 y.o. male presenting with alcohol problem. The history is provided by the patient.  Alcohol Problem This is a recurrent problem. Episode onset: was sober for 16month and then for the last 5 weeks has been drinking. The problem occurs constantly. The problem has been gradually worsening. Associated symptoms comments: States if he goes to long without alcohol he gets vomiting, abd cramping, dry heaves and the shakes.  Desires to stop drinking.  No SI/HI. Nothing aggravates the symptoms. Nothing relieves the symptoms. Treatments tried: was able to stop for a few days while taking ativan  The treatment provided no relief.    Past Medical History  Diagnosis Date  . Hepatitis C   . Diabetes mellitus   . Chronic hyponatremia   . Alcoholic   . Alcoholic cirrhosis of liver   . Thrombocytopenia   . Hyperglycemia     Past Surgical History  Procedure Laterality Date  . Plate in lower right leg  2008    History reviewed. No pertinent family history.  History  Substance Use Topics  . Smoking status: Current Every Day Smoker -- 1.00 packs/day for 40 years    Types: Cigarettes  . Smokeless tobacco: Not on file  . Alcohol Use: 0.0 oz/week     Comment: 12 pk- to a case of beer a day      Review of Systems  All other systems reviewed and are negative.    Allergies  Librium and Sulfa antibiotics  Home Medications   Current Outpatient Rx  Name  Route  Sig  Dispense  Refill  . LORazepam (ATIVAN) 1 MG tablet   Oral   Take 1 tablet (1 mg total) by mouth every 6 (six) hours as needed (withdrawl symptoms).   15 tablet   0   . metFORMIN (GLUCOPHAGE-XR) 750 MG 24 hr tablet   Oral  Take 1 tablet (750 mg total) by mouth 2 (two) times daily. For blood sugar control   60 tablet   1     BP 156/88  Pulse 94  Temp(Src) 98.5 F (36.9 C) (Oral)  Resp 18  SpO2 95%  Physical Exam  Nursing note and vitals reviewed. Constitutional: He is oriented to person, place, and time. He appears well-developed and well-nourished. No distress.  HENT:  Head: Normocephalic and atraumatic.  Mouth/Throat: Oropharynx is clear and moist.  Eyes: Conjunctivae and EOM are normal. Pupils are equal, round, and reactive to light.  Neck: Normal range of motion. Neck supple.  Cardiovascular: Normal rate, regular rhythm and intact distal pulses.   No murmur heard. Pulmonary/Chest: Effort normal and breath sounds normal. No respiratory distress. He has no wheezes. He has no rales.  Abdominal: Soft. He exhibits no distension. There is no tenderness. There is no rebound and no guarding.  Musculoskeletal: Normal range of motion. He exhibits no edema and no tenderness.  Neurological: He is alert and oriented to person, place, and time.  Skin: Skin is warm and dry. No rash noted. No erythema.  Psychiatric: He has a normal mood and affect. His behavior is normal. He expresses no homicidal and no suicidal ideation.    ED Course  Procedures (including critical care time)  Labs Reviewed  CBC - Abnormal; Notable for the following:    MCHC 36.1 (*)    All other components within normal limits  COMPREHENSIVE METABOLIC PANEL - Abnormal; Notable for the following:    Sodium 134 (*)    Chloride 95 (*)    Glucose, Bld 138 (*)    AST 74 (*)    ALT 60 (*)    All other components within normal limits  ETHANOL - Abnormal; Notable for the following:    Alcohol, Ethyl (B) 147 (*)    All other components within normal limits  GLUCOSE, CAPILLARY - Abnormal; Notable for the following:    Glucose-Capillary 134 (*)    All other components within normal limits  URINE RAPID DRUG SCREEN (HOSP PERFORMED)   No  results found.   No diagnosis found.    MDM   Patient is here for alcohol detox. He states that he's been drinking for the last 5 weeks am when he does not have alcohol he develops nausea, vomiting, shakes and abdominal cramping. He denies ever having seizures but states he always comes to the hospital and thinks he would if he did not seek medical attention. His last drink was approximately 12 hours ago his alcohol currently is 147. He is not displaying any signs of withdrawal at this time. His medical problems include hepatitis and diabetes. He has not been taking his metformin for the last 5 weeks because he states it is not react 12 alcohol. His blood sugar today is 134 and he states he has not eaten anything in the last 24 hours. He denies any drug use and is requesting detox. He is not suicidal or homicidal and is appropriate on exam.  Medically cleared.       Blanchie Dessert, MD 09/27/12 [phone]

## 2012-09-27 NOTE — ED Notes (Signed)
Pt reports that the last time he had ETOH was yesterday.

## 2012-09-27 NOTE — Progress Notes (Signed)
[facility] has seen patient and provided him with Primary Care Resources.

## 2012-09-27 NOTE — ED Notes (Signed)
[id number]> Expected date:<BR> Expected time:<BR> Means of arrival:<BR> Comments:<BR> EMS/alcohol with drawal

## 2012-09-27 NOTE — BH Assessment (Signed)
Assessment Note   [name] is an 56 y.o. male. Presenting requesting detox from ETOH. Pt reports having an 8 month sobriety with recent relapse approximately 5 weeks ago. Pt was not able to identify a precipitant for relapse. Pt stated "I don't even have a good reason why I started drinking again. But once I start, it's easy to fall into the habit." Reports drinking 2-3 (40 oz) beers daily for the past 5 weeks. Reports having nausea, tremors, anxiety, and unable to sleep; however reports feeling more calm and less feelings of withdrawal due to medications administered while in ED. Admits to not taking medication for his diabetes for the past 4 months after running out and not having a primary care doctor. Reports living alone currently and working full time with [employer]. Reports no changes in sleep, unless he stops drinking and then is unable to sleep due to the withdrawal symptoms. No appetite, but "I force myself to eat one meal a day" and reports a weight loss of 10 lbs within the past month. Stated last IPT was at [facility] for detox approximately 9 months ago followed up by 3 months at the [facility] program. Pt stated he was able to maintain his sobriety for 4 months following the program until this recent relapse. Pt denies SI, HI or psychosis.  Axis I: Alcohol Dependence Axis II: Deferred Axis III:  Past Medical History  Diagnosis Date  . Hepatitis C   . Diabetes mellitus   . Chronic hyponatremia   . Alcoholic   . Alcoholic cirrhosis of liver   . Thrombocytopenia   . Hyperglycemia    Axis IV: occupational problems and other psychosocial or environmental problems Axis V: 31-40 impairment in reality testing  Past Medical History:  Past Medical History  Diagnosis Date  . Hepatitis C   . Diabetes mellitus   . Chronic hyponatremia   . Alcoholic   . Alcoholic cirrhosis of liver   . Thrombocytopenia   . Hyperglycemia     Past Surgical History  Procedure Laterality Date  .  Plate in lower right leg  2008    Family History: History reviewed. No pertinent family history.  Social History:  reports that he has been smoking Cigarettes.  He has a 40 pack-year smoking history. He does not have any smokeless tobacco history on file. He reports that  drinks alcohol. He reports that he does not use illicit drugs.  Additional Social History:  Alcohol / Drug Use Pain Medications: N/A Prescriptions: See PTA Listing - pt not taking Rx for approx 4 months Over the Counter: N/A History of alcohol / drug use?: Yes Longest period of sobriety (when/how long): 8 months until 5 weeks ago Negative Consequences of Use: Financial;Work / School Withdrawal Symptoms: Other (Comment) Substance #1 Name of Substance 1: ETOH 1 - Age of First Use: teens 1 - Amount (size/oz): 2-3 (40 oz) beers 1 - Frequency: daily 1 - Duration: 5 weeks 1 - Last Use / Amount: approximately 12pm 5/20  CIWA: CIWA-Ar BP: 112/94 mmHg Pulse Rate: 86 Nausea and Vomiting: mild nausea with no vomiting Tactile Disturbances: none Tremor: no tremor Auditory Disturbances: not present Paroxysmal Sweats: no sweat visible Visual Disturbances: very mild sensitivity Anxiety: two Headache, Fullness in Head: very mild Agitation: somewhat more than normal activity Orientation and Clouding of Sensorium: oriented and can do serial additions CIWA-Ar Total: 6 COWS:    Allergies:  Allergies  Allergen Reactions  . Librium (Chlordiazepoxide Hcl) Anxiety  .  Sulfa Antibiotics Rash    Home Medications:  (Not in a hospital admission)  OB/GYN Status:  No LMP for male patient.  General Assessment Data Location of Assessment: [facility] ED ACT Assessment: Yes Living Arrangements: Alone Can pt return to current living arrangement?: Yes Admission Status: Voluntary Is patient capable of signing voluntary admission?: Yes Transfer from: [facility] Referral Source: Self/Family/Friend  Education Status Is patient  currently in school?: No Current Grade: N/A Highest grade of school patient has completed: 2 years college Name of school: N/A Contact person: N/A  Risk to self Suicidal Ideation: No Suicidal Intent: No Is patient at risk for suicide?: No Suicidal Plan?: No Access to Means: No What has been your use of drugs/alcohol within the last 12 months?: ETOH Daily for past 5 weeks Previous Attempts/Gestures: No How many times?: 0 Other Self Harm Risks: not taking diabetic Rx Triggers for Past Attempts: None known Intentional Self Injurious Behavior: None Family Suicide History: No Recent stressful life event(s): Other (Comment) (relapsed - no specific preciptitant) Persecutory voices/beliefs?: No Depression: No Substance abuse history and/or treatment for substance abuse?: Yes Suicide prevention information given to non-admitted patients: Not applicable  Risk to Others Homicidal Ideation: No Thoughts of Harm to Others: No Current Homicidal Intent: No Current Homicidal Plan: No Access to Homicidal Means: No Identified Victim: N/A History of harm to others?: No Assessment of Violence: None Noted Violent Behavior Description: N/A Does patient have access to weapons?: No Criminal Charges Pending?: No Does patient have a court date: No  Psychosis Hallucinations: None noted Delusions: None noted  Mental Status Report Appear/Hygiene: Disheveled Eye Contact: Good Motor Activity: Restlessness Speech: Logical/coherent Level of Consciousness: Quiet/awake Mood: Anxious Affect: Appropriate to circumstance Anxiety Level: Minimal Thought Processes: Coherent;Relevant Judgement: Unimpaired Orientation: Person;Place;Time;Situation Obsessive Compulsive Thoughts/Behaviors: None  Cognitive Functioning Concentration: Normal Memory: Recent Intact;Remote Intact IQ: Average Insight: Good Impulse Control: Poor Appetite: Poor (makes self eat 1 meal a day) Weight Loss: 10 (in past  month) Weight Gain: 0 Sleep: No Change (doesn't sleep when stops drinking) Total Hours of Sleep: 7 Vegetative Symptoms: None  ADLScreening [facility]) Patient's cognitive ability adequate to safely complete daily activities?: Yes Patient able to express need for assistance with ADLs?: Yes Independently performs ADLs?: Yes (appropriate for developmental age)  Abuse/Neglect [facility]) Physical Abuse: Denies Verbal Abuse: Denies Sexual Abuse: Denies  Prior Inpatient Therapy Prior Inpatient Therapy: Yes Prior Therapy Dates: Most recent 9 months ago @ [facility] Prior Therapy Facilty/Provider(s): [facility] Reason for Treatment: SA/ETOH  Prior Outpatient Therapy Prior Outpatient Therapy: Yes Prior Therapy Dates: 4 mos ago Prior Therapy Facilty/Provider(s): [facility] Reason for Treatment: SA/ETOH  ADL Screening (condition at time of admission) Patient's cognitive ability adequate to safely complete daily activities?: Yes Patient able to express need for assistance with ADLs?: Yes Independently performs ADLs?: Yes (appropriate for developmental age)       Abuse/Neglect Assessment (Assessment to be complete while patient is alone) Physical Abuse: Denies Verbal Abuse: Denies Sexual Abuse: Denies Exploitation of patient/patient's resources: Denies Self-Neglect: Denies Possible abuse reported to:: [place] department of social services Values / Beliefs Cultural Requests During Hospitalization: None Spiritual Requests During Hospitalization: None        Additional Information 1:1 In Past 12 Months?: No CIRT Risk: No Elopement Risk: No Does patient have medical clearance?: Yes     Disposition:  Disposition Initial Assessment Completed for this Encounter: Yes Disposition of Patient: Inpatient treatment program;Referred to ([facility]) Type of inpatient treatment program: Adult Patient  referred to: [facility]  On Site Evaluation by:   Reviewed with Physician:    Sallye Ober, [facility], [facility] Assessment Counselor 09/27/2012 10:59 AM

## 2012-09-27 NOTE — BHH Counselor (Signed)
No current beds at [facility] per [facility]. Contacted [facility], who confirmed detox bed openings. Faxed information for review with prescreen.

## 2012-09-27 NOTE — ED Notes (Signed)
Arrived on unit.

## 2012-09-27 NOTE — BHH Counselor (Signed)
Pt accepted to [facility] - pending bed. Completed support documentation for pt to be transported upon bed opening.  Sallye Ober, [facility], [facility] Assessment Counselor

## 2012-09-28 ENCOUNTER — Encounter (HOSPITAL_COMMUNITY): Payer: Self-pay | Admitting: Psychiatry

## 2012-09-28 LAB — GLUCOSE, CAPILLARY
Glucose-Capillary: 194 mg/dL — ABNORMAL HIGH (ref 70–99)
Glucose-Capillary: 301 mg/dL — ABNORMAL HIGH (ref 70–99)

## 2012-09-28 MED ORDER — INSULIN ASPART 100 UNIT/ML ~~LOC~~ SOLN
0.0000 [IU] | Freq: Three times a day (TID) | SUBCUTANEOUS | Status: DC
  Administered 2012-09-28: 5 [IU] via SUBCUTANEOUS
  Administered 2012-09-29 (×2): 3 [IU] via SUBCUTANEOUS

## 2012-09-28 MED ORDER — METFORMIN HCL ER 750 MG PO TB24
750.0000 mg | ORAL_TABLET | Freq: Two times a day (BID) | ORAL | Status: DC
  Administered 2012-09-28 – 2012-09-29 (×3): 750 mg via ORAL
  Filled 2012-09-28 (×5): qty 1

## 2012-09-28 MED ORDER — NICOTINE 21 MG/24HR TD PT24
21.0000 mg | MEDICATED_PATCH | Freq: Every day | TRANSDERMAL | Status: DC
  Administered 2012-09-28 – 2012-09-29 (×2): 21 mg via TRANSDERMAL
  Filled 2012-09-28 (×4): qty 1

## 2012-09-28 NOTE — Progress Notes (Addendum)
D:   Patient's self inventory sheet, patient sleeps well, hs poor appetite, low energy level, poor attention span.  Rated hopelessness and anxiety #5.  Denied depression.  Has experienced tremors and chilling in past  24 hours.  No questions for staff. A:  Medications administered per MD orders.  Emotional support and encouragement given to patient. R:  Denied SI and HI.   Deneid A/V hallucinations.  Stated he has been through this detox before.  Will take him 3 days to work through his withdrawals.  Will continue to monitor patient for safety with 15 minute checks.  Safety maintained.  MD stated to hold his ativan at 1200.  MD evaluated patient and stated to give his 1700 dose of ativan. 1700   Ativan 1 mg given to patient per MD instructions. 1830  Patient refused nicotine patch this morning.  After dinner, patient requested nicotine patch which was given to him.
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
## 2012-09-28 NOTE — Progress Notes (Signed)
Pt attended karaoke this evening.

## 2012-09-28 NOTE — BHH Group Notes (Signed)
[facility] LCSW Group Therapy  09/28/2012 1:15 PM  Type of Therapy:  Group Therapy 1:15 to 2:30   Participation Level:  Did Not Attend   [name]
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
## 2012-09-28 NOTE — Progress Notes (Signed)
Recreation Therapy Notes  Date: 05.22.2014 Time: 3:00pm Location: [address] Dayroom      Group Topic/Focus: Goal Setting  Participation Level: Active  Participation Quality: Appropriate  Affect: Euthymic  Cognitive: Appropriate  Additional Comments: Activity: Goal Board ; Explanation: Patient was asked to identify two goals they can work towards. Patient was asked to identify a starting date for each goal, obstacles they might encounter. Patients were asked to identify an additional column "Date Achieved" to track their progress towards reaching their goals.   Patient participated in group activity. Patient listened to group discussion about setting goals and the importance of setting goals.   Laureen Ochs Jaxzen Vanhorn, LRT/CTRS  Lane Hacker 09/28/2012 5:29 PM
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
## 2012-09-28 NOTE — BHH Group Notes (Signed)
[facility] LCSW Aftercare Discharge Planning Group Note   09/28/2012 8:45 AM  Participation Quality:  Did Not Attend   [name]

## 2012-09-28 NOTE — Progress Notes (Signed)
D   Pt requested to get ativan prior to bedtime tonight  He attended and participated in group   He is pleasant and cooperative  He is hoping to get discharged in the morning A   Verbal support given  Medications administered and effectiveness monitored  Q 15 min checks R   Pt safe at present

## 2012-09-28 NOTE — H&P (Signed)
Psychiatric Admission Assessment Adult  Patient Identification:  [name]  Date of Evaluation:  09/28/2012  Chief Complaint:  ETOH DEPENDENCE  History of Present Illness: This is a 57 year old Caucasian male. Admitted to [facility] from the [facility] ED with complaints of alcohol withdrawal symptoms requesting detoxification treatment. Patient reports, "I resumed alcohol drinking about 5 weeks ago after 7 months sobriety. I was drinking excessively about 3 (40 oz) bottles of alcohol) daily. When I finally stopped drinking alcohol Wednesday night, I fell very sick. I developed the shakes, nausea, vomiting, Diarrhea and muscle cramps. I could not go through with the withdrawal symptom on my own. That is why I called the ambulance to take me to the hospital. I have been drinking alcohol since the age of 25. I have been through series of treatment programs in the past. I have had several DUIS. I'm not depressed, but my anxiety is very high. It is at #9 at this moment".  Elements:  Location:  [facility] adult unit. Quality:  Abdominal pain, Nausea, Diarrhea, muscle cramps, poor sleep. Severity:  Rated anxiety at #9. Timing:  Started yesterday morning. Duration:  Have drinking excessively x 5 months. Context:  Several DUIs, finacial problems, poor health, chronic liver disease.  Associated Signs/Synptoms:  Depression Symptoms:  insomnia, feelings of worthlessness/guilt, disturbed sleep,  (Hypo) Manic Symptoms:  Impulsivity,  Anxiety Symptoms:  Excessive Worry,  Psychotic Symptoms:  Hallucinations: Denies  PTSD Symptoms: Had a traumatic exposure:  None reported  Psychiatric Specialty Exam: Physical Exam  Constitutional: He is oriented to person, place, and time. He appears well-developed.  HENT:  Head: Normocephalic.  Eyes: Pupils are equal, round, and reactive to light.  Neck: Normal range of motion.  Cardiovascular: Regular rhythm.   Respiratory: Effort normal.  GI: Soft.   Musculoskeletal: Normal range of motion.  Neurological: He is alert and oriented to person, place, and time.  Skin: Skin is warm and dry.  Facial areas flushed   Psychiatric: His speech is normal and behavior is normal. Thought content normal. His mood appears anxious (Rated at #8). Cognition and memory are normal. He expresses inappropriate judgment.    Review of Systems  Constitutional: Positive for diaphoresis.  HENT: Negative.   Eyes: Negative.   Respiratory: Negative.   Cardiovascular: Negative.   Gastrointestinal: Positive for nausea, vomiting, abdominal pain and diarrhea.  Genitourinary: Negative.   Musculoskeletal: Positive for myalgias.  Skin: Negative.        Facial areas flushed   Neurological: Positive for tremors.  Endo/Heme/Allergies: Negative.   Psychiatric/Behavioral: Positive for substance abuse (Hx alcoholism). Negative for suicidal ideas, hallucinations and memory loss. The patient is nervous/anxious (Rated at #8) and has insomnia.     Blood pressure 112/77, pulse 106, temperature 97.7 F (36.5 C), temperature source Oral, resp. rate 16, height 6' (1.829 m), weight 70.761 kg (156 lb).Body mass index is 21.15 kg/(m^2).  General Appearance: Disheveled  Eye Sport and exercise psychologist::  Fair  Speech:  Clear and Coherent  Volume:  Normal  Mood:  Anxious  Affect:  Flat  Thought Process:  Goal Directed and Intact  Orientation:  Full (Time, Place, and Person)  Thought Content:  Rumination  Suicidal Thoughts:  No  Homicidal Thoughts:  No  Memory:  Immediate;   Good Recent;   Good Remote;   Fair  Judgement:  Impaired  Insight:  Fair  Psychomotor Activity:  Restlessness and Tremor  Concentration:  Fair  Recall:  Fair  Akathisia:  No  Handed:  Right  AIMS (if indicated):     Assets:  Desire for Improvement  Sleep:  Number of Hours: 5.75    Past Psychiatric History: Diagnosis: Alcohol dependence  Hospitalizations: [facility] x 3  Outpatient Care: None  Substance Abuse Care: Been  to [facility], [facility], [facility]  Self-Mutilation: None reported  Suicidal Attempts: Denies attempt and or thoughts  Violent Behaviors: None reported   Past Medical History:   Past Medical History  Diagnosis Date  . Hepatitis C   . Diabetes mellitus   . Chronic hyponatremia     pt denies having   . Alcoholic   . Alcoholic cirrhosis of liver     Pt calls this a false diagnosis. Pt denies  . Thrombocytopenia   . Hyperglycemia    Cardiac History:  Thrombocytopenia   Allergies:   Allergies  Allergen Reactions  . Benadryl (Diphenhydramine Hcl)     "Causes nervousness"  . Librium (Chlordiazepoxide Hcl) Anxiety  . Sulfa Antibiotics Rash   PTA Medications: Prescriptions prior to admission  Medication Sig Dispense Refill  . LORazepam (ATIVAN) 1 MG tablet Take 1 mg by mouth every 6 (six) hours as needed for anxiety.      . metFORMIN (GLUCOPHAGE-XR) 750 MG 24 hr tablet Take 1 tablet (750 mg total) by mouth 2 (two) times daily. For blood sugar control  60 tablet  1    Previous Psychotropic Medications:  Medication/Dose  See medication lists               Substance Abuse History in the last 12 months:  yes  Consequences of Substance Abuse: Medical Consequences:  Liver damage, Possible death by overdose Legal Consequences:  Arrests, jail time, Loss of driving privilege. Family Consequences:  Family discord, divorce and or separation.  Social History:  reports that he has been smoking Cigarettes.  He has a 40 pack-year smoking history. He does not have any smokeless tobacco history on file. He reports that  drinks alcohol. He reports that he does not use illicit drugs. Additional Social History:  Current Place of Residence: [place], [place] of Birth:  [place], [place]  Family Members: "My 3 grown boys"  Marital Status:  Single  Children: 3  Sons: 3  Daughters: 0  Relationships: Single  Education:  [employer] Charity fundraiser Problems/Performance: Completed high  school  Religious Beliefs/Practices: None reported  History of Abuse (Emotional/Phsycial/Sexual): None reported  Occupational Experiences: Employed  Nature conservation officer History:  None.  Legal History: Hx DUI, none pending at this time.  Hobbies/Interests: None reported  Family History:  History reviewed. No pertinent family history.  Results for orders placed during the hospital encounter of 09/27/12 (from the past 72 hour(s))  GLUCOSE, CAPILLARY     Status: Abnormal   Collection Time    09/28/12  6:04 AM      Result Value Range   Glucose-Capillary 194 (*) 70 - 99 mg/dL   Psychological Evaluations:  Assessment:   AXIS I:  Alcohol dependence AXIS II:  Deferred AXIS III:   Past Medical History  Diagnosis Date  . Hepatitis C   . Diabetes mellitus   . Chronic hyponatremia     pt denies having   . Alcoholic   . Alcoholic cirrhosis of liver     Pt calls this a false diagnosis. Pt denies  . Thrombocytopenia   . Hyperglycemia    AXIS IV:  other psychosocial or environmental problems and Hx alcoholism AXIS V:  Serious symptoms.  Treatment  Plan/Recommendations: 1. Admit for crisis management and stabilization, estimated length of stay 3-5 days.  2. Medication management to reduce current symptoms to base line and improve the patient's overall level of functioning  3. Treat health problems as indicated.  4. Develop treatment plan to decrease risk of relapse upon discharge and the need for readmission.  5. Psycho-social education regarding relapse prevention and self care.  6. Health care follow up as needed for medical problems.  7. Review, reconcile, and reinstate any pertinent home medications for other health issues where appropriate. 8. Call for consults with hospitalist for any additional specialty patient care services as needed.  Treatment Plan Summary: Daily contact with patient to assess and evaluate symptoms and progress in treatment Medication management  Current  Medications:  Current Facility-Administered Medications  Medication Dose Route Frequency Provider Last Rate Last Dose  . acetaminophen (TYLENOL) tablet 650 mg  650 mg Oral Q6H PRN Shuvon Rankin, NP      . LORazepam (ATIVAN) tablet 1 mg  1 mg Oral TID Shuvon Rankin, NP   1 mg at 09/28/12 0827  . [START ON 09/29/2012] LORazepam (ATIVAN) tablet 1 mg  1 mg Oral BID Shuvon Rankin, NP      . [START ON 09/30/2012] LORazepam (ATIVAN) tablet 1 mg  1 mg Oral Daily Shuvon Rankin, NP      . LORazepam (ATIVAN) tablet 1 mg  1 mg Oral Q6H PRN Shuvon Rankin, NP   1 mg at 09/28/12 0208  . magnesium hydroxide (MILK OF MAGNESIA) suspension 30 mL  30 mL Oral Daily PRN Shuvon Rankin, NP      . metFORMIN (GLUCOPHAGE-XR) 24 hr tablet 750 mg  750 mg Oral BID Encarnacion Slates, NP      . nicotine (NICODERM CQ - dosed in mg/24 hours) patch 21 mg  21 mg Transdermal Daily Nicholaus Bloom, MD        Observation Level/Precautions:  15 minute checks  Laboratory:  Reviewed ED lab findings on file  Psychotherapy: Group sessions    Medications:  See medication lists  Consultations:  As needed   Discharge Concerns:  Maintaining sobriety  Estimated LOS: 3-5 days  Other:     I certify that inpatient services furnished can reasonably be expected to improve the patient's condition.   Lindell Spar I 5/22/201410:06 AM

## 2012-09-28 NOTE — Progress Notes (Signed)
Adult Psychoeducational Group Note  Date:  09/28/2012 Time:  11:24 AM  Group Topic/Focus:  Overcoming Stress:   The focus of this group is to define stress and help patients assess their triggers.  Participation Level:  Did Not Attend  Participation Quality:  Did not attend  Affect:  not present  Cognitive:  Not present  Insight: None  Engagement in Group:  Not present   Modes of Intervention:  Education  Additional Comments:  [place] did not attend group.   Dene Gentry 09/28/2012, 11:24 AM

## 2012-09-29 DIAGNOSIS — F1020 Alcohol dependence, uncomplicated: Principal | ICD-10-CM

## 2012-09-29 MED ORDER — METFORMIN HCL ER 750 MG PO TB24: 750.0000 mg | ORAL_TABLET | Freq: Two times a day (BID) | ORAL | Status: DC

## 2012-09-29 NOTE — Progress Notes (Signed)
Adult Psychoeducational Group Note  Date:  09/29/2012 Time:  1100   Group Topic/Focus:  Emotional Education:   The focus of this group is to discuss what feelings/emotions are, and how they are experienced.  Participation Level:  Did Not Attend  Participation Quality:  Did not attend  Affect:  Did not attend  Cognitive:  Did not attend  Insight: None  Engagement in Group:  None  Modes of Intervention:  Did not attend  Additional Comments:  [name] did not attend group.  Victorino Sparrow A 09/29/2012, 1:31 PM

## 2012-09-29 NOTE — [facility] Group Notes (Signed)
[place] LCSW Group Therapy  09/29/2012 1:15 PM  Type of Therapy:  Group Therapy 1:15 to 2:30 PM  Participation Level:  Active  Participation Quality:  Appropriate  Affect:  Appropriate  Cognitive:  Appropriate  Insight:  Engaged  Engagement in Therapy:  Engaged  Modes of Intervention:  Discussion, Education, Exploration, Socialization and Support  Summary of Progress/Problems: Group discussion focused on feelings about relapse and also included an educational portion on Post Acute Withdrawal Syndrome (PAWS).  Patients were able to process the information and share feelings related to length of time recovery takes. [name] was able to relate to PAWS well and share that it has often been a trigger for relapse for him.  [name] offered support to other group members   Harrill, Rozell Searing

## 2012-09-29 NOTE — Discharge Summary (Signed)
Physician Discharge Summary Note  Patient:  [name] is an 57 y.o., male MRN:  [id number] DOB:  06-02-1955 Patient phone:  [phone] (home)  Patient address:   [place] [address],   Date of Admission:  09/27/2012 Date of Discharge: 09/29/2012  Reason for Admission:  Alcohol Intoxication  Discharge Diagnoses:  Alcohol Dependence Active Problems:   * No active hospital problems. *  Review of Systems  Constitutional: Negative.   HENT: Negative.   Eyes: Negative.   Respiratory: Negative.   Cardiovascular: Negative.   Gastrointestinal: Negative.   Genitourinary: Negative.   Musculoskeletal: Negative.   Skin: Negative.   Neurological: Negative.   Endo/Heme/Allergies: Negative.   Psychiatric/Behavioral: Positive for substance abuse (hx of alcoholism). Negative for depression, suicidal ideas, hallucinations and memory loss. The patient does not have insomnia. Nervous/anxious: stabilized on medication prior to discharge.    Axis Diagnosis:   AXIS I:  Alcohol dependence AXIS II:  Deferred AXIS III:   Past Medical History  Diagnosis Date  . Hepatitis C   . Diabetes mellitus   . Chronic hyponatremia     pt denies having   . Alcoholic   . Alcoholic cirrhosis of liver     Pt calls this a false diagnosis. Pt denies  . Thrombocytopenia   . Hyperglycemia    AXIS IV:  other psychosocial or environmental problems and problems related to social environment AXIS V:  61-70 mild symptoms  Level of Care:  OP  Hospital Course:  This is a 57 year old Caucasian male. Admitted to [facility] from the [facility] ED with complaints of alcohol withdrawal symptoms requesting detoxification treatment. Patient reports, "I resumed alcohol drinking about 5 weeks ago after 7 months sobriety. I was drinking excessively about 3 (40 oz) bottles of alcohol) daily. When I finally stopped drinking alcohol Wednesday night, I fell very sick. I developed the shakes, nausea, vomiting,  Diarrhea and muscle cramps. I could not go through with the withdrawal symptom on my own. That is why I called the ambulance to take me to the hospital. I have been drinking alcohol since the age of 49. I have been through series of treatment programs in the past. I have had several DUIS. I'm not depressed, but my anxiety is very high. It is at #9 at this moment".   Patient was treated of alcohol intoxication with  Ativan detoxification protocol on a tapering dose.  We also took care of his other medical needs especially type 2 diabetes.  His stay with [place] is brief as patient has decided to go home today.  He reports feeling much better and does not suffer from alcohol  withdrawal symptoms today.       Consults:  None  Significant Diagnostic Studies:  labs: CBC, TOXICOLOGY, UDS, UA, AND CMP other:  CBC TOXICOLOGY  Discharge Vitals:   Blood pressure 101/71, pulse 108, temperature 97.7 F (36.5 C), temperature source Oral, resp. rate 18, height 6' (1.829 m), weight 70.761 kg (156 lb). Body mass index is 21.15 kg/(m^2). Lab Results:   Results for orders placed during the hospital encounter of 09/27/12 (from the past 72 hour(s))  GLUCOSE, CAPILLARY     Status: Abnormal   Collection Time    09/28/12  6:04 AM      Result Value Range   Glucose-Capillary 194 (*) 70 - 99 mg/dL  GLUCOSE, CAPILLARY     Status: Abnormal   Collection Time    09/28/12  5:35 PM  Result Value Range   Glucose-Capillary 206 (*) 70 - 99 mg/dL   Comment 1 Notify RN    GLUCOSE, CAPILLARY     Status: Abnormal   Collection Time    09/28/12  9:46 PM      Result Value Range   Glucose-Capillary 301 (*) 70 - 99 mg/dL  GLUCOSE, CAPILLARY     Status: Abnormal   Collection Time    09/29/12  6:13 AM      Result Value Range   Glucose-Capillary 179 (*) 70 - 99 mg/dL  GLUCOSE, CAPILLARY     Status: Abnormal   Collection Time    09/29/12 11:49 AM      Result Value Range   Glucose-Capillary 180 (*) 70 - 99 mg/dL     Physical Findings: AIMS: Facial and Oral Movements Muscles of Facial Expression: None, normal Lips and Perioral Area: None, normal Jaw: None, normal Tongue: None, normal,Extremity Movements Upper (arms, wrists, hands, fingers): None, normal Lower (legs, knees, ankles, toes): None, normal, Trunk Movements Neck, shoulders, hips: None, normal, Overall Severity Severity of abnormal movements (highest score from questions above): None, normal Incapacitation due to abnormal movements: None, normal Patient's awareness of abnormal movements (rate only patient's report): No Awareness, Dental Status Current problems with teeth and/or dentures?: No Does patient usually wear dentures?: No  CIWA:  CIWA-Ar Total: 4 COWS:  COWS Total Score: 5  Psychiatric Specialty Exam: See Psychiatric Specialty Exam and Suicide Risk Assessment completed by Attending Physician prior to discharge.  Discharge destination:  Home  Is patient on multiple antipsychotic therapies at discharge:  No   Has Patient had three or more failed trials of antipsychotic monotherapy by history:  No  Recommended Plan for Multiple Antipsychotic Therapies: NA     Medication List    STOP taking these medications       LORazepam 1 MG tablet  Commonly known as:  ATIVAN      TAKE these medications     Indication   metFORMIN 750 MG 24 hr tablet  Commonly known as:  GLUCOPHAGE-XR  Take 1 tablet (750 mg total) by mouth 2 (two) times daily. For blood sugar control   Indication:  Type 2 Diabetes       Follow-up recommendations:  Other:  Activities as tolerated, diet as instructed by primary Physician, Follow up appointments as recommended. Patient at this time is interested in joining [employer]. Continue to work the relapse prevention plan Comments:  Take your medicated as instructed.  Follow up with your outpatient facility/Psychiatrist for Alcohol dependence as recommended.  Patient opted to join [facility] meeting after  discharge.  Do not use alcohol to self treat depression.   Go to the nearest hospital ER for any kind of emergency.  Keep all appointment with your other Physicians for your other medical issues.  Total Discharge Time:  Greater than 30 minutes.  SignedCharmaine Downs, C PMHNP-BC 09/29/2012, 1:25 PM

## 2012-09-29 NOTE — Progress Notes (Signed)
[facility] Adult Case Management Discharge Plan :  Will you be returning to the same living situation after discharge: Yes,  room patient rents At discharge, do you have transportation home?:Yes,  bus voucher Do you have the ability to pay for your medications:No. None prescribed  Release of information consent forms completed and in the chart;  Patient's signature needed at discharge.  Patient to Follow up at: Patient refused followup  Patient denies SI/HI:   Yes,  denies both    Safety Planning and Suicide Prevention discussed:  No. Patient did not present with SI at admit or during hospitalization  [name] 09/29/2012, 1:06 PM

## 2012-09-29 NOTE — [facility] Suicide Risk Assessment (Signed)
Suicide Risk Assessment  Discharge Assessment     Demographic Factors:  Male and Caucasian  Mental Status Per Nursing Assessment::   On Admission:  NA  Current Mental Status by Physician: In full contact with reality. There are no suicidal ideas, plans or intent. His mood is euthymic. His affect is appropriate. He is willing and motivated to pursue sobriety. He states he feels he is fully detox. There is no evidence of acute withdrawal. He states he needs to pay his apartment today. He states he will be ready to go back to work on Monday   Loss Factors: NA  Historical Factors: NA  Risk Reduction Factors:   Employed  Continued Clinical Symptoms:  Alcohol/Substance Abuse/Dependencies  Cognitive Features That Contribute To Risk:  Closed-mindedness Thought constriction (tunnel vision)    Suicide Risk:  Minimal: No identifiable suicidal ideation.  Patients presenting with no risk factors but with morbid ruminations; may be classified as minimal risk based on the severity of the depressive symptoms  Discharge Diagnoses:   AXIS I:  Alcohol Dependence/withdrawal AXIS II:  Deferred AXIS III:   Past Medical History  Diagnosis Date  . Hepatitis C   . Diabetes mellitus   . Chronic hyponatremia     pt denies having   . Alcoholic   . Alcoholic cirrhosis of liver     Pt calls this a false diagnosis. Pt denies  . Thrombocytopenia   . Hyperglycemia    AXIS IV:  cant identify any acute stressors AXIS V:  61-70 mild symptoms  Plan Of Care/Follow-up recommendations:  Activity:  as tolerated Diet:  regular Will follow up with AA. Plans to get an appointment with a PCP to follow up his Diabetes. He usually goes to the hospital when he needs a refill of his insulin Is patient on multiple antipsychotic therapies at discharge:  No   Has Patient had three or more failed trials of antipsychotic monotherapy by history:  No  Recommended Plan for Multiple Antipsychotic Therapies:  N/A   Tracee Mccreery A 09/29/2012, 1:08 PM

## 2012-09-29 NOTE — Progress Notes (Signed)
Discharge Note:  Patient excited to be discharged.  Has bus ticket and will go to girlfriend's home for the night.  Denied SI and HI.   Denied A/V hallucinations.   Denied pain.  Stated he appreciated all the staff has done to assist him while at [facility].  Suicide prevention information given and discussed with patient who stated he understood and has no questions.

## 2012-09-29 NOTE — Tx Team (Signed)
Interdisciplinary Treatment Plan Update (Adult)    Date: 09/29/2012   Time Reviewed: 10:14 AM   Progress in Treatment:  Attending groups: No Participating in groups: No Taking medication as prescribed: Yes  Tolerating medication: Yes  Family/Significant othe contact made: None needed Patient understands diagnosis: Yes  Discussing patient identified problems/goals with staff: Yes  Medical problems stabilized or resolved: Yes  Denies suicidal/homicidal ideation: Yes  Patient has not harmed self or Others: Yes   New problem(s) identified: None Identified   Discharge Plan or Barriers: Patient reports no follow up needs, has been here multiple times and knows he needs to follow up for medical needs yet refuses mental health follow up   Additional comments: N/A   Reason for Continuation of Hospitalization: Medication stabilization  Withdrawal symptoms   Estimated length of stay: 1-2 days, patient to discharge Sat or Sun   For review of initial/current patient goals, please see plan of care.  Attendees:  Patient:    Family:    Physician: Carlton Adam  09/29/2012 10:14 AM   Nursing:  09/29/2012 10:14 AM   Clinical Social Worker Barnetta Chapel Jaylan Duggar  09/29/2012 10:14 AM   Other: Grayland Ormond, RN  09/29/2012 10:14 AM   Other: Luz Brazen, RN  09/29/2012 10:14 AM   Other: Hilda Lias, Community Care Coordinator  09/29/2012 10:14 AM   Other: Agustina Caroli, NP  09/29/2012 10:14 AM     Scribe for Treatment Team:  Sheilah Pigeon, LCSWA 09/29/2012 10:14 AM

## 2012-09-29 NOTE — [facility] Group Notes (Signed)
[facility] LCSW Aftercare Discharge Planning Group Note   09/29/2012 8:45 AM  Participation Quality:  Did not attend, not feeling well   Harrill, Rozell Searing

## 2012-09-29 NOTE — Progress Notes (Addendum)
D:    Patient denied SI and HI.  Denied A/V hallucinations.  Stated he felt very tired this morning, did not sleep well last night.  Poor appetite, low energy level, improving attention span.  Rated depression #4, denied hopelessness.  Has had tremors and felt agitation.  Worst pain #2.  After discharge, plans not to drink and go to AA.  Does have discharge plans.  Not sure if he can follow through with medications after discharge. A:  Medications administered per MD orders.  Emotional support and encouragement given to patient. R:  Will continue to monitor patient for safety with 15 minute checks.  Safety maintained.

## 2012-10-03 NOTE — Progress Notes (Signed)
Patient Discharge Instructions:  No documentation sent.  The Patient refused to signed the ROI.  [name], 10/03/2012, 3:23 PM

## 2012-10-11 ENCOUNTER — Emergency Department (HOSPITAL_COMMUNITY)
Admission: EM | Admit: 2012-10-11 | Discharge: 2012-10-11 | Discharge status: Against Medical Advice | Payer: Self-pay | Attending: Emergency Medicine | Admitting: Emergency Medicine

## 2012-10-11 ENCOUNTER — Encounter (HOSPITAL_COMMUNITY): Payer: Self-pay | Admitting: *Deleted

## 2012-10-11 DIAGNOSIS — Z8719 Personal history of other diseases of the digestive system: Secondary | ICD-10-CM | POA: Insufficient documentation

## 2012-10-11 DIAGNOSIS — Z8619 Personal history of other infectious and parasitic diseases: Secondary | ICD-10-CM | POA: Insufficient documentation

## 2012-10-11 DIAGNOSIS — Z862 Personal history of diseases of the blood and blood-forming organs and certain disorders involving the immune mechanism: Secondary | ICD-10-CM | POA: Insufficient documentation

## 2012-10-11 DIAGNOSIS — Z8639 Personal history of other endocrine, nutritional and metabolic disease: Secondary | ICD-10-CM | POA: Insufficient documentation

## 2012-10-11 DIAGNOSIS — F17200 Nicotine dependence, unspecified, uncomplicated: Secondary | ICD-10-CM | POA: Insufficient documentation

## 2012-10-11 DIAGNOSIS — F1010 Alcohol abuse, uncomplicated: Secondary | ICD-10-CM

## 2012-10-11 DIAGNOSIS — E119 Type 2 diabetes mellitus without complications: Secondary | ICD-10-CM | POA: Insufficient documentation

## 2012-10-11 DIAGNOSIS — F1020 Alcohol dependence, uncomplicated: Secondary | ICD-10-CM | POA: Insufficient documentation

## 2012-10-11 LAB — CBC
HCT: 38.7 % — ABNORMAL LOW (ref 39.0–52.0)
Hemoglobin: 14 g/dL (ref 13.0–17.0)
MCV: 93.7 fL (ref 78.0–100.0)
RBC: 4.13 MIL/uL — ABNORMAL LOW (ref 4.22–5.81)
WBC: 8.2 10*3/uL (ref 4.0–10.5)

## 2012-10-11 LAB — RAPID URINE DRUG SCREEN, HOSP PERFORMED: Barbiturates: NOT DETECTED

## 2012-10-11 LAB — ETHANOL: Alcohol, Ethyl (B): 347 mg/dL — ABNORMAL HIGH (ref 0–11)

## 2012-10-11 LAB — COMPREHENSIVE METABOLIC PANEL
Alkaline Phosphatase: 117 U/L (ref 39–117)
BUN: 7 mg/dL (ref 6–23)
CO2: 27 mEq/L (ref 19–32)
Chloride: 97 mEq/L (ref 96–112)
Creatinine, Ser: 0.52 mg/dL (ref 0.50–1.35)
GFR calc non Af Amer: >90 mL/min (ref >90)
Glucose, Bld: 180 mg/dL — ABNORMAL HIGH (ref 70–99)
Potassium: 4.1 mEq/L (ref 3.5–5.1)
Total Bilirubin: 0.3 mg/dL (ref 0.3–1.2)

## 2012-10-11 LAB — URINALYSIS, ROUTINE W REFLEX MICROSCOPIC
Glucose, UA: NEGATIVE mg/dL
Ketones, ur: NEGATIVE mg/dL
Leukocytes, UA: NEGATIVE
Nitrite: NEGATIVE
Protein, ur: 30 mg/dL — AB
pH: 6 (ref 5.0–8.0)

## 2012-10-11 LAB — ACETAMINOPHEN LEVEL: Acetaminophen (Tylenol), Serum: <15 ug/mL (ref 10–30)

## 2012-10-11 LAB — URINE MICROSCOPIC-ADD ON

## 2012-10-11 LAB — SALICYLATE LEVEL: Salicylate Lvl: <2 mg/dL — ABNORMAL LOW (ref 2.8–20.0)

## 2012-10-11 NOTE — ED Notes (Signed)
Pt. Left AMA

## 2012-10-11 NOTE — ED Notes (Signed)
Walked into patient room and pt. Was getting dressed. States "I got to go. I have an emergency". Asked patient if there was anything this nurse could do, he said no. States "This isn't about drinking, but I have to get in contact with my family". Offered patient the phone but pt denied. States "I'll be back around 1 or 2 am". Talked to patient about needing to take care of himself but patient just continued to say "I need to go".

## 2012-10-11 NOTE — ED Notes (Addendum)
Pt states that he wants detox from alcohol. Pt states that he drank a 40 an hour ago which is his typical amount of alcohol per day. Pt girlfriend told him that he needed help so he is here. Pt has tried to detox before without success. Pt denies pain at this time. Pt denies SI or HI at this time.

## 2012-10-11 NOTE — ED Notes (Signed)
Pt. Here wanting detox from alcohol. States drank a 40 right before he came in. States was sober for approx 6 months and then relapsed about 5 weeks ago. Was recently discharged from detox x2 weeks ago. States "my drinking is more of a physical addiction. I know that I need to stop and that I'm doing wrong when I drink I just can't help it".  Pt. Denies SI/HI

## 2012-10-11 NOTE — ED Notes (Signed)
Pt. Educated on risks and benefits of leaving and staying. Pt. Verbalized understanding. Pt. States "I'll be back but I have a family emergency".

## 2012-10-11 NOTE — ED Notes (Signed)
Pt. wanded and placed in blue scrubs.

## 2012-10-12 ENCOUNTER — Encounter (HOSPITAL_COMMUNITY): Payer: Self-pay | Admitting: *Deleted

## 2012-10-12 ENCOUNTER — Emergency Department (HOSPITAL_COMMUNITY)
Admission: EM | Admit: 2012-10-12 | Discharge: 2012-10-12 | Disposition: A | Discharge status: Home / Self Care | Payer: Self-pay | Attending: Emergency Medicine | Admitting: Emergency Medicine

## 2012-10-12 DIAGNOSIS — Z8639 Personal history of other endocrine, nutritional and metabolic disease: Secondary | ICD-10-CM | POA: Insufficient documentation

## 2012-10-12 DIAGNOSIS — Z8719 Personal history of other diseases of the digestive system: Secondary | ICD-10-CM | POA: Insufficient documentation

## 2012-10-12 DIAGNOSIS — E119 Type 2 diabetes mellitus without complications: Secondary | ICD-10-CM | POA: Insufficient documentation

## 2012-10-12 DIAGNOSIS — F1011 Alcohol abuse, in remission: Secondary | ICD-10-CM | POA: Insufficient documentation

## 2012-10-12 DIAGNOSIS — F17200 Nicotine dependence, unspecified, uncomplicated: Secondary | ICD-10-CM | POA: Insufficient documentation

## 2012-10-12 DIAGNOSIS — Z79899 Other long term (current) drug therapy: Secondary | ICD-10-CM | POA: Insufficient documentation

## 2012-10-12 DIAGNOSIS — Z8619 Personal history of other infectious and parasitic diseases: Secondary | ICD-10-CM | POA: Insufficient documentation

## 2012-10-12 DIAGNOSIS — Z862 Personal history of diseases of the blood and blood-forming organs and certain disorders involving the immune mechanism: Secondary | ICD-10-CM | POA: Insufficient documentation

## 2012-10-12 NOTE — ED Provider Notes (Signed)
History     CSN: [id number]  Arrival date & time 10/12/12  0220   First MD Initiated Contact with Patient 10/12/12 0230      Chief Complaint  Patient presents with  . Alcohol Intoxication    (Consider location/radiation/quality/duration/timing/severity/associated sxs/prior treatment) HPI History provided by pt and EMS.   Level 5 caveat applies because patient is intoxicated.  Per EMS report, pt called them requesting alcohol detox.  Drank "enough" today.  Pt tells me that he has no idea why he is in the ED and "EMS messed it up".  He has no complaints currently.  Per prior chart, pt is seen in ED for intoxication and request for detox frequently.  Has been prescribed ativan multiple times and was admitted 5/21-5/23 for detox.  Planned to join AA at that time.   Past Medical History  Diagnosis Date  . Hepatitis C   . Diabetes mellitus   . Chronic hyponatremia     pt denies having   . Alcoholic   . Alcoholic cirrhosis of liver     Pt calls this a false diagnosis. Pt denies  . Thrombocytopenia   . Hyperglycemia     Past Surgical History  Procedure Laterality Date  . Plate in lower right leg  2008    History reviewed. No pertinent family history.  History  Substance Use Topics  . Smoking status: Current Every Day Smoker -- 1.00 packs/day for 40 years    Types: Cigarettes  . Smokeless tobacco: Not on file  . Alcohol Use: 0.0 oz/week     Comment: 12 pk- to a case of beer a day      Review of Systems  All other systems reviewed and are negative.    Allergies  Benadryl; Librium; and Sulfa antibiotics  Home Medications   Current Outpatient Rx  Name  Route  Sig  Dispense  Refill  . metFORMIN (GLUCOPHAGE-XR) 750 MG 24 hr tablet   Oral   Take 1 tablet (750 mg total) by mouth 2 (two) times daily. For blood sugar control   60 tablet   1     BP 128/81  Pulse 95  Temp(Src) 98 F (36.7 C) (Oral)  Resp 18  Ht 6' (1.829 m)  Wt 160 lb (72.576 kg)  BMI 21.7 kg/m2   SpO2 94%  Physical Exam  Nursing note and vitals reviewed. Constitutional: He is oriented to person, place, and time. He appears well-developed and well-nourished. No distress.  HENT:  Head: Normocephalic and atraumatic.  Eyes:  Normal appearance  Neck: Normal range of motion.  Cardiovascular: Normal rate and regular rhythm.   Pulmonary/Chest: Effort normal and breath sounds normal. No respiratory distress.  Musculoskeletal: Normal range of motion.  Neurological: He is alert and oriented to person, place, and time.  Intoxicated.  Slurred and incoherent speech.   Skin: Skin is warm and dry. No rash noted.  Psychiatric: He has a normal mood and affect. His behavior is normal.    ED Course  Procedures (including critical care time)  Labs Reviewed - No data to display No results found.   1. Alcohol intoxication in relapsed alcoholic, uncomplicated       MDM  Pt brought to ED by EMS for alcohol detox.  He tells me that he does not know why he is here and has no complaints.  He is intoxicated but exam otherwise unremarkable.  Will let him sober up and then d/c home in am.    Pt  more alert and is ambulatory.  He requests prescription for ativan for detox but has received multiple times in the past and continues to come to ED intoxicated.  I explained that he has been given several outpatient resources and he may return to the ER for detox when he is sober and/or develops withdrawal symptoms.         Remer Macho, PA-C 10/12/12 [phone]

## 2012-10-12 NOTE — ED Notes (Signed)
Per EMS report: pt has drank "enough" ETOH (beer) today.  Pt requesting detox help.  Pt observed ambulating to room with no difficulty.  Skin warm and dry.  A/o x 4.

## 2012-10-12 NOTE — ED Notes (Signed)
[id number]> Expected date:<BR> Expected time:<BR> Means of arrival:<BR> Comments:<BR> EMS/57 yo male-wants detox

## 2012-10-12 NOTE — ED Provider Notes (Signed)
Medical screening examination/treatment/procedure(s) were performed by non-physician practitioner and as supervising physician I was immediately available for consultation/collaboration.  Virgel Manifold, MD 10/12/12 [phone]

## 2012-10-13 ENCOUNTER — Emergency Department: Payer: Self-pay | Admitting: Emergency Medicine

## 2012-10-13 ENCOUNTER — Emergency Department (HOSPITAL_COMMUNITY)
Admission: EM | Admit: 2012-10-13 | Discharge: 2012-10-13 | Disposition: A | Discharge status: Rehabilitation Facility | Payer: Self-pay | Attending: Emergency Medicine | Admitting: Emergency Medicine

## 2012-10-13 ENCOUNTER — Encounter (HOSPITAL_COMMUNITY): Payer: Self-pay

## 2012-10-13 DIAGNOSIS — F1010 Alcohol abuse, uncomplicated: Secondary | ICD-10-CM | POA: Insufficient documentation

## 2012-10-13 DIAGNOSIS — Z862 Personal history of diseases of the blood and blood-forming organs and certain disorders involving the immune mechanism: Secondary | ICD-10-CM | POA: Insufficient documentation

## 2012-10-13 DIAGNOSIS — Z8619 Personal history of other infectious and parasitic diseases: Secondary | ICD-10-CM | POA: Insufficient documentation

## 2012-10-13 DIAGNOSIS — Z8639 Personal history of other endocrine, nutritional and metabolic disease: Secondary | ICD-10-CM | POA: Insufficient documentation

## 2012-10-13 DIAGNOSIS — Z008 Encounter for other general examination: Secondary | ICD-10-CM | POA: Insufficient documentation

## 2012-10-13 DIAGNOSIS — Z79899 Other long term (current) drug therapy: Secondary | ICD-10-CM | POA: Insufficient documentation

## 2012-10-13 DIAGNOSIS — F17200 Nicotine dependence, unspecified, uncomplicated: Secondary | ICD-10-CM | POA: Insufficient documentation

## 2012-10-13 DIAGNOSIS — Z8719 Personal history of other diseases of the digestive system: Secondary | ICD-10-CM | POA: Insufficient documentation

## 2012-10-13 DIAGNOSIS — E1169 Type 2 diabetes mellitus with other specified complication: Secondary | ICD-10-CM | POA: Insufficient documentation

## 2012-10-13 LAB — URINALYSIS, COMPLETE
Bacteria: NONE SEEN
Bilirubin,UR: NEGATIVE
Glucose,UR: >=500 mg/dL (ref 0–75)
Ketone: NEGATIVE
Leukocyte Esterase: NEGATIVE
Ph: 7 (ref 4.5–8.0)
Protein: NEGATIVE
Squamous Epithelial: <1
WBC UR: 1 /HPF (ref 0–5)

## 2012-10-13 LAB — CBC
HCT: 38.4 % — ABNORMAL LOW (ref 39.0–52.0)
HCT: 39.2 % — ABNORMAL LOW (ref 40.0–52.0)
HGB: 13.8 g/dL (ref 13.0–18.0)
MCH: 34.6 pg — ABNORMAL HIGH (ref 26.0–34.0)
MCHC: 35.3 g/dL (ref 32.0–36.0)
MCV: 93.9 fL (ref 78.0–100.0)
MCV: 98 fL (ref 80–100)
Platelet: 89 10*3/uL — ABNORMAL LOW (ref 150–440)
RBC: 3.99 10*6/uL — ABNORMAL LOW (ref 4.40–5.90)
RDW: 14.6 % (ref 11.5–15.5)
WBC: 4.5 10*3/uL (ref 4.0–10.5)
WBC: 5.3 10*3/uL (ref 3.8–10.6)

## 2012-10-13 LAB — COMPREHENSIVE METABOLIC PANEL
Albumin: 3.5 g/dL (ref 3.5–5.2)
Alkaline Phosphatase: 141 U/L — ABNORMAL HIGH (ref 50–136)
Anion Gap: 8 (ref 7–16)
BUN: 6 mg/dL (ref 6–23)
Bilirubin,Total: 0.4 mg/dL (ref 0.2–1.0)
CO2: 30 mEq/L (ref 19–32)
Calcium, Total: 8.5 mg/dL (ref 8.5–10.1)
Chloride: 97 mEq/L (ref 96–112)
Chloride: 96 mmol/L — ABNORMAL LOW (ref 98–107)
Creatinine, Ser: 0.49 mg/dL — ABNORMAL LOW (ref 0.50–1.35)
EGFR (Non-African Amer.): >60
GFR calc Af Amer: >90 mL/min (ref >90)
GFR calc non Af Amer: >90 mL/min (ref >90)
SGPT (ALT): 81 U/L — ABNORMAL HIGH (ref 12–78)
Sodium: 132 mmol/L — ABNORMAL LOW (ref 136–145)
Total Bilirubin: 0.3 mg/dL (ref 0.3–1.2)

## 2012-10-13 LAB — DRUG SCREEN, URINE
Barbiturates, Ur Screen: NEGATIVE (ref ?–200)
Benzodiazepine, Ur Scrn: NEGATIVE (ref ?–200)
Cocaine Metabolite,Ur ~~LOC~~: NEGATIVE (ref ?–300)
Methadone, Ur Screen: NEGATIVE (ref ?–300)
Opiate, Ur Screen: NEGATIVE (ref ?–300)

## 2012-10-13 LAB — ETHANOL
Alcohol, Ethyl (B): 335 mg/dL — ABNORMAL HIGH (ref 0–11)
Ethanol: 150 mg/dL

## 2012-10-13 LAB — RAPID URINE DRUG SCREEN, HOSP PERFORMED: Opiates: NOT DETECTED

## 2012-10-13 MED ORDER — THIAMINE HCL 100 MG/ML IJ SOLN: 100.0000 mg | Freq: Every day | INTRAMUSCULAR | Status: DC

## 2012-10-13 MED ORDER — LORAZEPAM 1 MG PO TABS: 0.0000 mg | ORAL_TABLET | Freq: Four times a day (QID) | ORAL | Status: DC

## 2012-10-13 MED ORDER — METFORMIN HCL ER 750 MG PO TB24: 750.0000 mg | ORAL_TABLET | Freq: Two times a day (BID) | ORAL | Status: DC

## 2012-10-13 MED ORDER — FOLIC ACID 1 MG PO TABS
1.0000 mg | ORAL_TABLET | Freq: Every day | ORAL | Status: DC
  Administered 2012-10-13: 1 mg via ORAL
  Filled 2012-10-13: qty 1

## 2012-10-13 MED ORDER — ADULT MULTIVITAMIN W/MINERALS CH
1.0000 | ORAL_TABLET | Freq: Every day | ORAL | Status: DC
  Administered 2012-10-13: 1 via ORAL
  Filled 2012-10-13: qty 1

## 2012-10-13 MED ORDER — VITAMIN B-1 100 MG PO TABS
100.0000 mg | ORAL_TABLET | Freq: Every day | ORAL | Status: DC
  Administered 2012-10-13: 100 mg via ORAL
  Filled 2012-10-13: qty 1

## 2012-10-13 MED ORDER — LORAZEPAM 1 MG PO TABS: 0.0000 mg | ORAL_TABLET | Freq: Two times a day (BID) | ORAL | Status: DC

## 2012-10-13 MED ORDER — LORAZEPAM 1 MG PO TABS
1.0000 mg | ORAL_TABLET | Freq: Three times a day (TID) | ORAL | Status: DC | PRN
  Administered 2012-10-13: 1 mg via ORAL
  Filled 2012-10-13: qty 1

## 2012-10-13 NOTE — BH Assessment (Signed)
Assessment Note   [name] is an 57 y.o. male that presented to request detox after his relapse on ETOH.  Pt is drinking approximately 240 ounces of beer daily, last use this am.  Pt reports feeling shakey, diapheretic, and slightly tremulous.  Pt admits relapsing after over seven months of sobriety.  Pt was stressed over finances and began drinking again and has now "probably lost my job and become homeless if I can't pay rent."  Pt voices minimal support, but has been in previous rehabilitation facilities, including [facility] and the [facility] program.  Pt denies current legal charges, but is currently on probation.  Pt denies any SI and HI, any hallucinations, or mental health issues.  Pt admits "I need to get help now before it gets worse.  CLinician contacted [facility] at 1400 and completed prescreen with Judeen Hammans who will address referral with nursing staff and contact Clinician back.    Axis I: Alcohol Dependence Axis II: Deferred Axis III:  Past Medical History  Diagnosis Date  . Hepatitis C   . Diabetes mellitus   . Chronic hyponatremia     pt denies having   . Alcoholic   . Alcoholic cirrhosis of liver     Pt calls this a false diagnosis. Pt denies  . Thrombocytopenia   . Hyperglycemia    Axis IV: economic problems, occupational problems, other psychosocial or environmental problems, problems related to legal system/crime, problems related to social environment and problems with primary support group Axis V: 31-40 impairment in reality testing  Past Medical History:  Past Medical History  Diagnosis Date  . Hepatitis C   . Diabetes mellitus   . Chronic hyponatremia     pt denies having   . Alcoholic   . Alcoholic cirrhosis of liver     Pt calls this a false diagnosis. Pt denies  . Thrombocytopenia   . Hyperglycemia     Past Surgical History  Procedure Laterality Date  . Plate in lower right leg  2008    Family History:  Family History  Problem Relation Age of Onset  .  ALS Father     Social History:  reports that he has been smoking Cigarettes.  He has a 40 pack-year smoking history. He has never used smokeless tobacco. He reports that he drinks about 9.0 ounces of alcohol per week. He reports that he does not use illicit drugs.  Additional Social History:  Alcohol / Drug Use Pain Medications: See MAR Prescriptions: See MAR Over the Counter: PRN History of alcohol / drug use?: Yes Longest period of sobriety (when/how long): 7 months Negative Consequences of Use: Financial;Legal;Personal relationships;Work / School Withdrawal Symptoms: Anorexia;Blackouts;Patient aware of relationship between substance abuse and physical/medical complications;Weakness;Tremors Substance #1 Name of Substance 1: ETOH 1 - Age of First Use: 19 1 - Amount (size/oz): 240 ounces 1 - Frequency: QD 1 - Duration: 7 weeks 1 - Last Use / Amount: ths am  CIWA: CIWA-Ar BP: 111/78 mmHg Pulse Rate: 110 Nausea and Vomiting: no nausea and no vomiting Tactile Disturbances: none Tremor: not visible, but can be felt fingertip to fingertip Auditory Disturbances: not present Paroxysmal Sweats: two Visual Disturbances: not present Anxiety: no anxiety, at ease Headache, Fullness in Head: none present Agitation: normal activity Orientation and Clouding of Sensorium: oriented and can do serial additions CIWA-Ar Total: 0 COWS:    Allergies:  Allergies  Allergen Reactions  . Benadryl (Diphenhydramine Hcl)     "Causes nervousness"  . Librium (Chlordiazepoxide  Hcl) Anxiety  . Sulfa Antibiotics Rash    Home Medications:  (Not in a hospital admission)  OB/GYN Status:  No LMP for male patient.  General Assessment Data Location of Assessment: [facility] ED Living Arrangements: Alone Can pt return to current living arrangement?: Yes Admission Status: Voluntary Is patient capable of signing voluntary admission?: Yes Transfer from: [facility] Referral Source:  Self/Family/Friend  Education Status Is patient currently in school?: No  Risk to self Suicidal Ideation: No Suicidal Intent: No Is patient at risk for suicide?: No Suicidal Plan?: No Access to Means: No What has been your use of drugs/alcohol within the last 12 months?: relapsed seven weeks ago on ETOH Previous Attempts/Gestures: No How many times?: 0 Other Self Harm Risks: damage to his liver Triggers for Past Attempts: None known Intentional Self Injurious Behavior: Damaging Comment - Self Injurious Behavior: relapsed on ETOH seven weeks ago Family Suicide History: No Recent stressful life event(s): Financial Problems;Recent negative physical changes Persecutory voices/beliefs?: No Depression: Yes Depression Symptoms: Guilt;Feeling worthless/self pity Substance abuse history and/or treatment for substance abuse?: Yes Suicide prevention information given to non-admitted patients: Not applicable  Risk to Others Homicidal Ideation: No Thoughts of Harm to Others: No Current Homicidal Intent: No Current Homicidal Plan: No Access to Homicidal Means: No Identified Victim: none per pt History of harm to others?: No Assessment of Violence: None Noted Violent Behavior Description: none noted Does patient have access to weapons?: No Criminal Charges Pending?: No Does patient have a court date: No  Psychosis Hallucinations: None noted Delusions: None noted  Mental Status Report Appear/Hygiene: Disheveled Eye Contact: Fair Motor Activity: Unsteady Speech: Logical/coherent Level of Consciousness: Alert Mood: Anxious;Helpless Affect: Anxious;Depressed;Sad Anxiety Level: Moderate Thought Processes: Relevant Judgement: Impaired Orientation: Person;Place;Time;Situation Obsessive Compulsive Thoughts/Behaviors: Moderate  Cognitive Functioning Concentration: Decreased Memory: Recent Impaired;Remote Impaired IQ: Average Insight: Poor  ADLScreening [facility] Assessment  Services) Patient's cognitive ability adequate to safely complete daily activities?: Yes Patient able to express need for assistance with ADLs?: Yes  Abuse/Neglect [facility]) Physical Abuse: Denies Verbal Abuse: Denies Sexual Abuse: Denies  Prior Inpatient Therapy Prior Inpatient Therapy: Yes Prior Therapy Dates: 7 mths ago and prior Prior Therapy Facilty/Provider(s): [facility] and others Reason for Treatment: detox and SA treatment  Prior Outpatient Therapy Prior Outpatient Therapy: Yes Prior Therapy Dates: 4 mos ago Prior Therapy Facilty/Provider(s): [facility] Reason for Treatment: SA/ETOH  ADL Screening (condition at time of admission) Patient's cognitive ability adequate to safely complete daily activities?: Yes Patient able to express need for assistance with ADLs?: Yes Weakness of Legs: None Weakness of Arms/Hands: None       Abuse/Neglect Assessment (Assessment to be complete while patient is alone) Physical Abuse: Denies Verbal Abuse: Denies Sexual Abuse: Denies Exploitation of patient/patient's resources: Denies Self-Neglect: Denies Values / Beliefs Cultural Requests During Hospitalization: None Spiritual Requests During Hospitalization: None        Additional Information 1:1 In Past 12 Months?: No CIRT Risk: No Elopement Risk: No Does patient have medical clearance?: Yes     Disposition:  Awaiting disposition from [facility] regarding inpatient detoxification.  Disposition Initial Assessment Completed for this Encounter: Yes Disposition of Patient: Inpatient treatment program;Referred to Patient referred to: [facility]  On Site Evaluation by:   Reviewed with Physician:     Herbert Moors 10/13/2012 2:19 PM

## 2012-10-13 NOTE — ED Notes (Signed)
Pt has blue scrub to put on in the room. Pt is eating breakfast at this time

## 2012-10-13 NOTE — ED Notes (Signed)
Patient given his train ticket and bus pass to go [place] [facility].

## 2012-10-13 NOTE — BHH Counselor (Signed)
Pt has been screened at [facility], but per nursing staff, there are currently no beds.  Pt has been referred to [facility] as of 1530.

## 2012-10-13 NOTE — ED Notes (Signed)
The patient called [facility] and advised that he wanted to receive medical clearance so he could receive inpatient treatment for ETOH abuse.

## 2012-10-13 NOTE — Progress Notes (Signed)
CARE MANAGEMENT ED NOTE 10/13/2012  Patient:  [name], [name]   Account Number:  [id number]  Date Initiated:  10/13/2012  Documentation initiated by:  [facility]  Subjective/Objective Assessment:     Subjective/Objective Assessment Detail:     Action/Plan:   Action/Plan Detail:   Anticipated DC Date:  10/13/2012     Status Recommendation to Physician:   Result of Recommendation:  Agreed    DC Planning Services  CM consult  DC out of service area  Medication Assistance    Choice offered to / List presented to:            Status of service:  Completed, signed off  ED Comments:   ED Comments Detail:  Pt being discharged  [facility] in [place]. Pt given 15 days supply of   Metformin by in patient pharmacy. SW approved and  arranged [employer] ticket to be picked up at station.. Pt given [place] bus ticket to depot. No CM needs identified

## 2012-10-13 NOTE — ED Notes (Signed)
Patient has arrived on pod c. The act team is aware of pt and are awaiting pt bac to decrease before they conduct their assessment on him. He is calm and cooperative. He verbalizes understanding of plan of care and is agreeable.

## 2012-10-13 NOTE — ED Notes (Signed)
Pt givenextra blanket sleeping at this time

## 2012-10-13 NOTE — ED Provider Notes (Signed)
History     CSN: [id number]  Arrival date & time 10/13/12  6962   First MD Initiated Contact with Patient 10/13/12 [phone]      Chief Complaint  Patient presents with  . Medical Clearance  . Alcohol Problem    (Consider location/radiation/quality/duration/timing/severity/associated sxs/prior treatment) HPI Comments: Pt is an alcoholic - states it runs in the family, has been drinking since his teenage years.  Admits to heavy ETOH use / alcoholism for since 57 years old.  States that his probation officer told him he needed to get treatment, pt states he is in agreement.  He was Sober for 7 months until 2 months ago - did a program in [place] and went to lots of [employer].  Then started drinking again.  Social situations gave him more stress.  He now drinks heavily, daily, nothing helps him stop.  Lives in [place], lives by himself.  He wants inpatient at this time.  Last drink was one hour ago - Beer is his drink.  States he has not been taking his Metformin.   Denies drug use.  Patient is a 57 y.o. male presenting with alcohol problem. The history is provided by the patient.  Alcohol Problem    Past Medical History  Diagnosis Date  . Hepatitis C   . Diabetes mellitus   . Chronic hyponatremia     pt denies having   . Alcoholic   . Alcoholic cirrhosis of liver     Pt calls this a false diagnosis. Pt denies  . Thrombocytopenia   . Hyperglycemia     Past Surgical History  Procedure Laterality Date  . Plate in lower right leg  2008    Family History  Problem Relation Age of Onset  . ALS Father     History  Substance Use Topics  . Smoking status: Current Every Day Smoker -- 1.00 packs/day for 40 years    Types: Cigarettes  . Smokeless tobacco: Never Used  . Alcohol Use: 9.0 oz/week    15 Cans of beer per week      Review of Systems  All other systems reviewed and are negative.    Allergies  Benadryl; Librium; and Sulfa antibiotics  Home Medications    Current Outpatient Rx  Name  Route  Sig  Dispense  Refill  . metFORMIN (GLUCOPHAGE-XR) 750 MG 24 hr tablet   Oral   Take 750 mg by mouth 2 (two) times daily with a meal.           BP 167/90  Pulse 106  Temp(Src) 98.5 F (36.9 C) (Oral)  Resp 20  SpO2 91%  Physical Exam  Nursing note and vitals reviewed. Constitutional: He appears well-developed and well-nourished. No distress.  Wreaks of stale alcohol  HENT:  Head: Normocephalic and atraumatic.  Mouth/Throat: Oropharynx is clear and moist. No oropharyngeal exudate.  Eyes: Conjunctivae and EOM are normal. Pupils are equal, round, and reactive to light. Right eye exhibits no discharge. Left eye exhibits no discharge. No scleral icterus.  Neck: Normal range of motion. Neck supple. No JVD present. No thyromegaly present.  Cardiovascular: Normal rate, regular rhythm, normal heart sounds and intact distal pulses.  Exam reveals no gallop and no friction rub.   No murmur heard. Pulmonary/Chest: Effort normal and breath sounds normal. No respiratory distress. He has no wheezes. He has no rales.  Abdominal: Soft. Bowel sounds are normal. He exhibits no distension and no mass. There is no tenderness.  No  hepatosplenomegaly  Musculoskeletal: Normal range of motion. He exhibits no edema and no tenderness.  Lymphadenopathy:    He has no cervical adenopathy.  Neurological: He is alert. Coordination normal.  Clear speech, memory intact, alert, follows commands  Skin: Skin is warm and dry. No rash noted. No erythema.  Psychiatric: He has a normal mood and affect. His behavior is normal.    ED Course  Procedures (including critical care time)  Labs Reviewed  CBC - Abnormal; Notable for the following:    RBC 4.09 (*)    HCT 38.4 (*)    MCHC 36.2 (*)    Platelets 87 (*)    All other components within normal limits  COMPREHENSIVE METABOLIC PANEL - Abnormal; Notable for the following:    Glucose, Bld 185 (*)    Creatinine, Ser 0.49  (*)    AST 96 (*)    ALT 68 (*)    Alkaline Phosphatase 122 (*)    All other components within normal limits  ETHANOL - Abnormal; Notable for the following:    Alcohol, Ethyl (B) 335 (*)    All other components within normal limits  ETHANOL - Abnormal; Notable for the following:    Alcohol, Ethyl (B) 156 (*)    All other components within normal limits  URINE RAPID DRUG SCREEN (HOSP PERFORMED)   No results found.   1. Alcohol abuse       MDM  Pt has alcohol intoxication at this time - VS normal, PE unremarkable - willa ttempt to place.  Stable to move to Pod C with holding orderes, labs pending.   D/w ACT team - Raquel Sarna - will attempt to place.     Johnna Acosta, MD 10/13/12 [phone]

## 2012-10-13 NOTE — ED Notes (Signed)
MD at bedside.

## 2012-10-22 ENCOUNTER — Encounter (HOSPITAL_COMMUNITY): Payer: Self-pay | Admitting: Emergency Medicine

## 2012-10-22 ENCOUNTER — Emergency Department (HOSPITAL_COMMUNITY)
Admission: EM | Admit: 2012-10-22 | Discharge: 2012-10-22 | Disposition: A | Discharge status: Home / Self Care | Payer: Self-pay | Attending: Emergency Medicine | Admitting: Emergency Medicine

## 2012-10-22 ENCOUNTER — Emergency Department (HOSPITAL_COMMUNITY): Payer: Self-pay

## 2012-10-22 DIAGNOSIS — R0789 Other chest pain: Secondary | ICD-10-CM

## 2012-10-22 DIAGNOSIS — F1020 Alcohol dependence, uncomplicated: Secondary | ICD-10-CM | POA: Insufficient documentation

## 2012-10-22 DIAGNOSIS — Z8719 Personal history of other diseases of the digestive system: Secondary | ICD-10-CM | POA: Insufficient documentation

## 2012-10-22 DIAGNOSIS — Z862 Personal history of diseases of the blood and blood-forming organs and certain disorders involving the immune mechanism: Secondary | ICD-10-CM | POA: Insufficient documentation

## 2012-10-22 DIAGNOSIS — Z8639 Personal history of other endocrine, nutritional and metabolic disease: Secondary | ICD-10-CM | POA: Insufficient documentation

## 2012-10-22 DIAGNOSIS — R079 Chest pain, unspecified: Secondary | ICD-10-CM | POA: Insufficient documentation

## 2012-10-22 DIAGNOSIS — E119 Type 2 diabetes mellitus without complications: Secondary | ICD-10-CM | POA: Insufficient documentation

## 2012-10-22 DIAGNOSIS — Z79899 Other long term (current) drug therapy: Secondary | ICD-10-CM | POA: Insufficient documentation

## 2012-10-22 DIAGNOSIS — F17200 Nicotine dependence, unspecified, uncomplicated: Secondary | ICD-10-CM | POA: Insufficient documentation

## 2012-10-22 DIAGNOSIS — F10920 Alcohol use, unspecified with intoxication, uncomplicated: Secondary | ICD-10-CM

## 2012-10-22 DIAGNOSIS — Z8619 Personal history of other infectious and parasitic diseases: Secondary | ICD-10-CM | POA: Insufficient documentation

## 2012-10-22 LAB — BASIC METABOLIC PANEL
CO2: 27 mEq/L (ref 19–32)
Glucose, Bld: 239 mg/dL — ABNORMAL HIGH (ref 70–99)
Potassium: 3.5 mEq/L (ref 3.5–5.1)
Sodium: 138 mEq/L (ref 135–145)

## 2012-10-22 LAB — CBC
HCT: 36.5 % — ABNORMAL LOW (ref 39.0–52.0)
MCV: 93.8 fL (ref 78.0–100.0)
Platelets: 91 10*3/uL — ABNORMAL LOW (ref 150–400)
RBC: 3.89 MIL/uL — ABNORMAL LOW (ref 4.22–5.81)
WBC: 5.2 10*3/uL (ref 4.0–10.5)

## 2012-10-22 LAB — POCT I-STAT TROPONIN I: Troponin i, poc: 0 ng/mL (ref 0.00–0.08)

## 2012-10-22 MED ORDER — SODIUM CHLORIDE 0.9 % IV BOLUS (SEPSIS)
1000.0000 mL | Freq: Once | INTRAVENOUS | Status: AC
  Administered 2012-10-22: 1000 mL via INTRAVENOUS

## 2012-10-22 NOTE — ED Notes (Signed)
He has had his labs drawn; and remains quite drowsy (but arousable).  His spo2 dropped to 90%; so we placed him on l2 at 2 l.p.m., which currently has his spo2 at 96-98%.

## 2012-10-22 NOTE — ED Notes (Signed)
Pt escorted to discharge window. Pt verbalized understanding discharge instructions. In no acute distress.

## 2012-10-22 NOTE — ED Provider Notes (Signed)
History     CSN: [id number]  Arrival date & time 10/22/12  0421   First MD Initiated Contact with Patient 10/22/12 [phone]      Chief Complaint  Patient presents with  . Medical Clearance  . Alcohol Intoxication    (Consider location/radiation/quality/duration/timing/severity/associated sxs/prior treatment) HPI  Patient is a 57 year old male past medical history significant for alcoholism, alcoholic cirrhosis of liver, hepatitis C, DM, thrombocytopenia presented to emergency department via EMS initially for chest pain. Pt is a level V caveat d/t to current alcoholic intoxication.      Past Medical History  Diagnosis Date  . Hepatitis C   . Diabetes mellitus   . Chronic hyponatremia     pt denies having   . Alcoholic   . Alcoholic cirrhosis of liver     Pt calls this a false diagnosis. Pt denies  . Thrombocytopenia   . Hyperglycemia     Past Surgical History  Procedure Laterality Date  . Plate in lower right leg  2008    Family History  Problem Relation Age of Onset  . ALS Father     History  Substance Use Topics  . Smoking status: Current Every Day Smoker -- 1.00 packs/day for 40 years    Types: Cigarettes  . Smokeless tobacco: Never Used  . Alcohol Use: 9.0 oz/week    15 Cans of beer per week      Review of Systems  Unable to perform ROS: Other    Allergies  Benadryl; Librium; and Sulfa antibiotics  Home Medications   Current Outpatient Rx  Name  Route  Sig  Dispense  Refill  . metFORMIN (GLUCOPHAGE-XR) 750 MG 24 hr tablet   Oral   Take 1 tablet (750 mg total) by mouth 2 (two) times daily with a meal.   60 tablet   0   . LORazepam (ATIVAN) 1 MG tablet   Oral   Take 1 mg by mouth every 6 (six) hours as needed for anxiety.           BP 121/74  Pulse 95  Temp(Src) 97.6 F (36.4 C) (Oral)  Resp 20  SpO2 95%  Physical Exam  Constitutional: He appears well-developed and well-nourished. No distress.  HENT:  Head: Normocephalic and  atraumatic.  Eyes: Conjunctivae are normal.  Neck: Neck supple.  Cardiovascular: Normal rate, regular rhythm and normal heart sounds.   Pulmonary/Chest: Effort normal and breath sounds normal. He exhibits no tenderness.  Abdominal: Soft. Bowel sounds are normal. There is no tenderness.  Musculoskeletal: He exhibits no edema.  Neurological: He is alert.  Skin: Skin is warm and dry. He is not diaphoretic.    ED Course  Procedures (including critical care time)  Medications  sodium chloride 0.9 % bolus 1,000 mL (0 mLs Intravenous Stopped 10/22/12 1044)    Date: 10/22/2012  Rate: 81  Rhythm: normal sinus rhythm  QRS Axis: normal  Intervals: normal  ST/T Wave abnormalities: normal  Conduction Disutrbances:none  Narrative Interpretation:   Old EKG Reviewed: none available    Labs Reviewed  BASIC METABOLIC PANEL - Abnormal; Notable for the following:    Glucose, Bld 239 (*)    All other components within normal limits  CBC - Abnormal; Notable for the following:    RBC 3.89 (*)    Hemoglobin 12.9 (*)    HCT 36.5 (*)    Platelets 91 (*)    All other components within normal limits  ETHANOL - Abnormal; Notable for the  following:    Alcohol, Ethyl (B) 257 (*)    All other components within normal limits  POCT I-STAT TROPONIN I   Dg Chest 2 View  10/22/2012   *RADIOLOGY REPORT*  Clinical Data: Shortness of breath.  CHEST - 2 VIEW  Comparison: 07/18/2010  Findings: Two views of the chest were obtained.  The lungs are clear except there is a questionable 8 mm nodular structure in the left lower chest.  Heart and mediastinum are within normal limits. Evidence for an azygos lobe.  IMPRESSION: No acute cardiopulmonary disease.  Indeterminate 8 mm nodular density in the left lower chest.  This could represent overlying shadows or nipple shadow.  Recommended a follow-up chest radiograph with nipple markers.   Original Report Authenticated By: Markus Daft, M.D.     1. Alcohol intoxication,  uncomplicated   2. Alcohol dependence   3. Chest pain, non-cardiac       MDM  Pt improved w/ IVF and PO intake and is now more alert and oriented than upon arrival. Patient is to be discharged with recommendation to follow up with PCP in regards to today's hospital visit, specifically for re-evalution of CXR. Chest pain is not likely of cardiac or pulmonary etiology d/t presentation, perc negative, VSS, no tracheal deviation, no JVD or new murmur, RRR, breath sounds equal bilaterally, EKG without acute abnormalities, negative troponin, and no signs of ACS on CXR. Pt has been advised to return to the ED is CP becomes exertional, associated with diaphoresis or nausea, radiates to left jaw/arm, worsens or becomes concerning in any way. Pt is agreeable to discharge and requesting to go home. Case has been discussed with and seen by Dr. Dorna Mai who agrees with the above plan to discharge. Patient is stable at time of discharge          Harlow Mares, PA-C 10/22/12 1528

## 2012-10-22 NOTE — ED Notes (Signed)
Per EMS, pt wanting detox, currently intoxicated.  Pt has multiple skin tears from unknown cause.  Pt initially called EMS for chest pain, denies at this time, only wants detox.

## 2012-10-22 NOTE — ED Notes (Signed)
[id number]<HA> Expected date:<BR> Expected time:<BR> Means of arrival:<BR> Comments:<BR>

## 2012-10-23 NOTE — ED Provider Notes (Signed)
Medical screening examination/treatment/procedure(s) were performed by non-physician practitioner and as supervising physician I was immediately available for consultation/collaboration.   Saddie Benders. Ameen Mostafa, MD 10/23/12 1530

## 2012-11-21 ENCOUNTER — Encounter (HOSPITAL_COMMUNITY): Payer: Self-pay | Admitting: *Deleted

## 2012-11-21 ENCOUNTER — Emergency Department (HOSPITAL_COMMUNITY)
Admission: EM | Admit: 2012-11-21 | Discharge: 2012-11-22 | Disposition: A | Discharge status: Other Acute Inpatient Hospital | Payer: No Typology Code available for payment source | Attending: Emergency Medicine | Admitting: Emergency Medicine

## 2012-11-21 DIAGNOSIS — F10229 Alcohol dependence with intoxication, unspecified: Secondary | ICD-10-CM

## 2012-11-21 DIAGNOSIS — F17200 Nicotine dependence, unspecified, uncomplicated: Secondary | ICD-10-CM | POA: Insufficient documentation

## 2012-11-21 DIAGNOSIS — F1010 Alcohol abuse, uncomplicated: Secondary | ICD-10-CM | POA: Insufficient documentation

## 2012-11-21 DIAGNOSIS — Z862 Personal history of diseases of the blood and blood-forming organs and certain disorders involving the immune mechanism: Secondary | ICD-10-CM | POA: Insufficient documentation

## 2012-11-21 DIAGNOSIS — E1169 Type 2 diabetes mellitus with other specified complication: Secondary | ICD-10-CM | POA: Insufficient documentation

## 2012-11-21 DIAGNOSIS — Z8719 Personal history of other diseases of the digestive system: Secondary | ICD-10-CM | POA: Insufficient documentation

## 2012-11-21 DIAGNOSIS — Z8619 Personal history of other infectious and parasitic diseases: Secondary | ICD-10-CM | POA: Insufficient documentation

## 2012-11-21 DIAGNOSIS — Z8639 Personal history of other endocrine, nutritional and metabolic disease: Secondary | ICD-10-CM | POA: Insufficient documentation

## 2012-11-21 DIAGNOSIS — F10920 Alcohol use, unspecified with intoxication, uncomplicated: Secondary | ICD-10-CM

## 2012-11-21 LAB — CBC WITH DIFFERENTIAL/PLATELET
Basophils Absolute: 0.1 10*3/uL (ref 0.0–0.1)
HCT: 42.5 % (ref 39.0–52.0)
Hemoglobin: 15.4 g/dL (ref 13.0–17.0)
Lymphocytes Relative: 44 % (ref 12–46)
Monocytes Absolute: 0.7 10*3/uL (ref 0.1–1.0)
Neutro Abs: 2.5 10*3/uL (ref 1.7–7.7)
RDW: 13.9 % (ref 11.5–15.5)
WBC: 6.1 10*3/uL (ref 4.0–10.5)

## 2012-11-21 LAB — RAPID URINE DRUG SCREEN, HOSP PERFORMED
Amphetamines: NOT DETECTED
Benzodiazepines: NOT DETECTED
Cocaine: NOT DETECTED
Opiates: NOT DETECTED

## 2012-11-21 LAB — BASIC METABOLIC PANEL
CO2: 27 mEq/L (ref 19–32)
Chloride: 95 mEq/L — ABNORMAL LOW (ref 96–112)
Creatinine, Ser: 0.55 mg/dL (ref 0.50–1.35)
Potassium: 5 mEq/L (ref 3.5–5.1)

## 2012-11-21 LAB — ETHANOL: Alcohol, Ethyl (B): 362 mg/dL — ABNORMAL HIGH (ref 0–11)

## 2012-11-21 MED ORDER — LORAZEPAM 1 MG PO TABS: 0.0000 mg | ORAL_TABLET | Freq: Two times a day (BID) | ORAL | Status: DC

## 2012-11-21 MED ORDER — ADULT MULTIVITAMIN W/MINERALS CH
1.0000 | ORAL_TABLET | Freq: Every day | ORAL | Status: DC
  Administered 2012-11-22: 1 via ORAL
  Filled 2012-11-21: qty 1

## 2012-11-21 MED ORDER — LORAZEPAM 1 MG PO TABS
1.0000 mg | ORAL_TABLET | Freq: Four times a day (QID) | ORAL | Status: DC | PRN
  Administered 2012-11-22 (×2): 1 mg via ORAL
  Filled 2012-11-21 (×2): qty 1

## 2012-11-21 MED ORDER — LORAZEPAM 1 MG PO TABS: 0.0000 mg | ORAL_TABLET | Freq: Four times a day (QID) | ORAL | Status: DC

## 2012-11-21 MED ORDER — FOLIC ACID 1 MG PO TABS
1.0000 mg | ORAL_TABLET | Freq: Every day | ORAL | Status: DC
  Administered 2012-11-22: 1 mg via ORAL
  Filled 2012-11-21: qty 1

## 2012-11-21 MED ORDER — VITAMIN B-1 100 MG PO TABS
100.0000 mg | ORAL_TABLET | Freq: Every day | ORAL | Status: DC
  Administered 2012-11-22: 100 mg via ORAL
  Filled 2012-11-21: qty 1

## 2012-11-21 MED ORDER — LORAZEPAM 2 MG/ML IJ SOLN: 1.0000 mg | Freq: Four times a day (QID) | INTRAMUSCULAR | Status: DC | PRN

## 2012-11-21 MED ORDER — THIAMINE HCL 100 MG/ML IJ SOLN: 100.0000 mg | Freq: Every day | INTRAMUSCULAR | Status: DC

## 2012-11-21 NOTE — ED Notes (Signed)
Pt brought in by EMS; requesting help with alcohol detox

## 2012-11-21 NOTE — ED Notes (Signed)
Pt changed into blue scrubs and wanded by security

## 2012-11-21 NOTE — ED Provider Notes (Signed)
History    CSN: [id number] Arrival date & time 11/21/12  2107  First MD Initiated Contact with Patient 11/21/12 2157     Chief Complaint  Patient presents with  . Medical Clearance   HPI  History provided by the patient. 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 Patient has had multiple prior visits the emergency room and admissions to be [facility] in the past to help with alcohol detox. Patient states he continues to drink 5-6, 40 ounce alcoholic beers a day. He denies any other aggravating or alleviating factors. Denies any associated SI or HI. No hallucinations. Denies any abdominal pain, nausea or vomiting. No other complaints.   Past Medical History  Diagnosis Date  . Hepatitis C   . Diabetes mellitus   . Chronic hyponatremia     pt denies having   . Alcoholic   . Alcoholic cirrhosis of liver     Pt calls this a false diagnosis. Pt denies  . Thrombocytopenia   . Hyperglycemia    Past Surgical History  Procedure Laterality Date  . Plate in lower right leg  2008   Family History  Problem Relation Age of Onset  . ALS Father    History  Substance Use Topics  . Smoking status: Current Every Day Smoker -- 1.00 packs/day for 40 years    Types: Cigarettes  . Smokeless tobacco: Never Used  . Alcohol Use: 9.0 oz/week    15 Cans of beer per week    Review of Systems  Constitutional: Negative for fever.  Gastrointestinal: Negative for nausea, vomiting and abdominal pain.  Neurological: Negative for tremors.  All other systems reviewed and are negative.    Allergies  Benadryl; Librium; and Sulfa antibiotics  Home Medications   Current Outpatient Rx  Name  Route  Sig  Dispense  Refill  . metFORMIN (GLUCOPHAGE-XR) 750 MG 24 hr tablet   Oral   Take 1 tablet (750 mg total) by mouth 2 (two) times daily with a meal.   60 tablet   0     BP 136/80  Pulse 78  Temp(Src) 98.6 F (37 C) (Oral)  Resp 16  SpO2 94% Physical Exam  Nursing note and vitals reviewed. Constitutional: He is oriented to person, place, and time. He appears well-developed and well-nourished. No distress.  HENT:  Head: Normocephalic.  Cardiovascular: Normal rate and regular rhythm.   Pulmonary/Chest: Effort normal and breath sounds normal. No respiratory distress. He has no wheezes. He has no rales.  Abdominal: Soft.  Musculoskeletal: Normal range of motion.  Neurological: He is alert and oriented to person, place, and time. He has normal strength. He displays no tremor. No sensory deficit.  Skin: Skin is warm.  Psychiatric: He has a normal mood and affect. His behavior is normal. He is not actively hallucinating. He expresses no homicidal and no suicidal ideation.    ED Course  Procedures  Results for orders placed during the hospital encounter of 11/21/12  CBC WITH DIFFERENTIAL      Result Value Range   WBC 6.1  4.0 - 10.5 K/uL   RBC 4.40  4.22 - 5.81 MIL/uL   Hemoglobin 15.4  13.0 - 17.0 g/dL   HCT 42.5  39.0 - 52.0 %   MCV 96.6  78.0 - 100.0 fL   MCH 35.0 (*) 26.0 - 34.0 pg   MCHC  36.2 (*) 30.0 - 36.0 g/dL   RDW 13.9  11.5 - 15.5 %   Platelets 79 (*) 150 - 400 K/uL   Neutrophils Relative % 42 (*) 43 - 77 %   Neutro Abs 2.5  1.7 - 7.7 K/uL   Lymphocytes Relative 44  12 - 46 %   Lymphs Abs 2.7  0.7 - 4.0 K/uL   Monocytes Relative 12  3 - 12 %   Monocytes Absolute 0.7  0.1 - 1.0 K/uL   Eosinophils Relative 1  0 - 5 %   Eosinophils Absolute 0.1  0.0 - 0.7 K/uL   Basophils Relative 1  0 - 1 %   Basophils Absolute 0.1  0.0 - 0.1 K/uL  BASIC METABOLIC PANEL      Result Value Range   Sodium 133 (*) 135 - 145 mEq/L   Potassium 5.0  3.5 - 5.1 mEq/L   Chloride 95 (*) 96 - 112 mEq/L   CO2 27  19 - 32 mEq/L   Glucose, Bld 206 (*) 70 - 99 mg/dL   BUN 7  6 - 23 mg/dL   Creatinine, Ser 0.55  0.50 - 1.35 mg/dL   Calcium 9.4  8.4 - 10.5  mg/dL   GFR calc non Af Amer >90  >90 mL/min   GFR calc Af Amer >90  >90 mL/min  ETHANOL      Result Value Range   Alcohol, Ethyl (B) 362 (*) 0 - 11 mg/dL  URINE RAPID DRUG SCREEN (HOSP PERFORMED)      Result Value Range   Opiates NONE DETECTED  NONE DETECTED   Cocaine NONE DETECTED  NONE DETECTED   Benzodiazepines NONE DETECTED  NONE DETECTED   Amphetamines NONE DETECTED  NONE DETECTED   Tetrahydrocannabinol NONE DETECTED  NONE DETECTED   Barbiturates NONE DETECTED  NONE DETECTED        1. Alcohol dependence with acute alcoholic intoxication, with unspecified complication   2. Alcohol abuse   3. Alcohol intoxication, uncomplicated       MDM  Patient seen and evaluated. Patient sitting comfortably eating a sandwich. Does not appear in any acute distress.  Normal speech.    Patient with EtOH level of 362.  Patient has had previous past admissions to [facility] where he was able to be treated with Ativan medically for withdrawal.  Plan to allow pt to sober and give small script of ativan with outpatient resources.  Holding orders written with withdrawal protocol.   Martie Lee, PA-C 11/22/12 [phone]

## 2012-11-22 ENCOUNTER — Encounter (HOSPITAL_COMMUNITY): Payer: Self-pay

## 2012-11-22 ENCOUNTER — Inpatient Hospital Stay (HOSPITAL_COMMUNITY)
Admission: AD | Admit: 2012-11-22 | Discharge: 2012-11-28 | DRG: 897 | Disposition: A | Discharge status: Home / Self Care | Payer: No Typology Code available for payment source | Source: Intra-hospital | Attending: Psychiatry | Admitting: Psychiatry

## 2012-11-22 DIAGNOSIS — Z79899 Other long term (current) drug therapy: Secondary | ICD-10-CM

## 2012-11-22 DIAGNOSIS — K7030 Alcoholic cirrhosis of liver without ascites: Secondary | ICD-10-CM | POA: Diagnosis present

## 2012-11-22 DIAGNOSIS — F1020 Alcohol dependence, uncomplicated: Principal | ICD-10-CM | POA: Diagnosis present

## 2012-11-22 DIAGNOSIS — E119 Type 2 diabetes mellitus without complications: Secondary | ICD-10-CM | POA: Diagnosis present

## 2012-11-22 DIAGNOSIS — E871 Hypo-osmolality and hyponatremia: Secondary | ICD-10-CM | POA: Diagnosis present

## 2012-11-22 DIAGNOSIS — B1920 Unspecified viral hepatitis C without hepatic coma: Secondary | ICD-10-CM | POA: Diagnosis present

## 2012-11-22 MED ORDER — TRAZODONE HCL 50 MG PO TABS
50.0000 mg | ORAL_TABLET | Freq: Every evening | ORAL | Status: DC | PRN
  Administered 2012-11-22 – 2012-11-27 (×12): 50 mg via ORAL
  Filled 2012-11-22 (×4): qty 1
  Filled 2012-11-22 (×2): qty 28
  Filled 2012-11-22 (×12): qty 1

## 2012-11-22 MED ORDER — THIAMINE HCL 100 MG/ML IJ SOLN: 100.0000 mg | Freq: Once | INTRAMUSCULAR | Status: DC

## 2012-11-22 MED ORDER — LOPERAMIDE HCL 2 MG PO CAPS: 2.0000 mg | ORAL_CAPSULE | ORAL | Status: AC | PRN

## 2012-11-22 MED ORDER — LORAZEPAM 1 MG PO TABS
2.0000 mg | ORAL_TABLET | Freq: Four times a day (QID) | ORAL | Status: DC | PRN
  Administered 2012-11-22 – 2012-11-23 (×4): 2 mg via ORAL
  Filled 2012-11-22: qty 1
  Filled 2012-11-22: qty 2
  Filled 2012-11-22: qty 1
  Filled 2012-11-22 (×2): qty 2

## 2012-11-22 MED ORDER — METFORMIN HCL ER 750 MG PO TB24
750.0000 mg | ORAL_TABLET | Freq: Two times a day (BID) | ORAL | Status: DC
  Administered 2012-11-22 – 2012-11-28 (×12): 750 mg via ORAL
  Filled 2012-11-22 (×9): qty 1
  Filled 2012-11-22: qty 4
  Filled 2012-11-22 (×2): qty 1
  Filled 2012-11-22: qty 4
  Filled 2012-11-22 (×5): qty 1

## 2012-11-22 MED ORDER — VITAMIN B-1 100 MG PO TABS
100.0000 mg | ORAL_TABLET | Freq: Every day | ORAL | Status: DC
  Administered 2012-11-23 – 2012-11-28 (×6): 100 mg via ORAL
  Filled 2012-11-22 (×8): qty 1

## 2012-11-22 MED ORDER — MAGNESIUM HYDROXIDE 400 MG/5ML PO SUSP: 30.0000 mL | Freq: Every day | ORAL | Status: DC | PRN

## 2012-11-22 MED ORDER — ACETAMINOPHEN 325 MG PO TABS
650.0000 mg | ORAL_TABLET | Freq: Four times a day (QID) | ORAL | Status: DC | PRN
  Administered 2012-11-22 – 2012-11-24 (×3): 650 mg via ORAL

## 2012-11-22 MED ORDER — INSULIN ASPART 100 UNIT/ML ~~LOC~~ SOLN
0.0000 [IU] | Freq: Three times a day (TID) | SUBCUTANEOUS | Status: DC
  Administered 2012-11-22: 5 [IU] via SUBCUTANEOUS
  Administered 2012-11-23: 2 [IU] via SUBCUTANEOUS
  Administered 2012-11-23: 3 [IU] via SUBCUTANEOUS
  Administered 2012-11-23: 8 [IU] via SUBCUTANEOUS
  Administered 2012-11-24: 3 [IU] via SUBCUTANEOUS
  Administered 2012-11-24: 5 [IU] via SUBCUTANEOUS
  Administered 2012-11-24: 11 [IU] via SUBCUTANEOUS
  Administered 2012-11-25 (×2): 5 [IU] via SUBCUTANEOUS
  Administered 2012-11-25: 3 [IU] via SUBCUTANEOUS
  Administered 2012-11-26 (×3): 5 [IU] via SUBCUTANEOUS
  Administered 2012-11-27: 3 [IU] via SUBCUTANEOUS
  Administered 2012-11-27: 5 [IU] via SUBCUTANEOUS
  Administered 2012-11-27 – 2012-11-28 (×2): 3 [IU] via SUBCUTANEOUS
  Administered 2012-11-28: 5 [IU] via SUBCUTANEOUS

## 2012-11-22 MED ORDER — HYDROXYZINE HCL 25 MG PO TABS: 25.0000 mg | ORAL_TABLET | Freq: Four times a day (QID) | ORAL | Status: AC | PRN

## 2012-11-22 MED ORDER — ADULT MULTIVITAMIN W/MINERALS CH
1.0000 | ORAL_TABLET | Freq: Every day | ORAL | Status: DC
  Administered 2012-11-23 – 2012-11-28 (×6): 1 via ORAL
  Filled 2012-11-22 (×9): qty 1

## 2012-11-22 MED ORDER — INSULIN ASPART 100 UNIT/ML ~~LOC~~ SOLN
0.0000 [IU] | Freq: Every day | SUBCUTANEOUS | Status: DC
  Administered 2012-11-23: 2 [IU] via SUBCUTANEOUS
  Administered 2012-11-25: 3 [IU] via SUBCUTANEOUS
  Administered 2012-11-26 – 2012-11-27 (×2): 2 [IU] via SUBCUTANEOUS

## 2012-11-22 NOTE — Progress Notes (Signed)
57 year old male admitted for detox from ETOH. States that he has been drinking 4+ 40 oz beers daily. Patient deneis SI/HI or psychosis. CIWA is 9. States that he has not been taking his Metformin because it upsets his stomach when drinking ETOH. Patient is being evicted from his apartment and would like to explore an [employer]. No pain on admission. Patient states that he was a patient at [facility] in May and relapsed shortly after discharge.

## 2012-11-22 NOTE — ED Provider Notes (Signed)
Medical screening examination/treatment/procedure(s) were conducted as a shared visit with non-physician practitioner(s) or resident  and myself.  I personally evaluated the patient during the encounter and agree with the findings and plan unless otherwise indicated.  Recurrent intoxication.  Long discussion with patient, once he sobers up we will be able to [facility] him with clonidine/ or ativan with outpt fup.  Well appearing in ED visit with mild clinical intoxication.     Mariea Clonts, MD 11/22/12 [phone]

## 2012-11-22 NOTE — Tx Team (Signed)
Initial Interdisciplinary Treatment Plan  PATIENT STRENGTHS: (choose at least two) Capable of independent living Motivation for treatment/growth Work skills  PATIENT STRESSORS: Substance abuse lack of housing, being evicted   PROBLEM LIST: Problem List/Patient Goals Date to be addressed Date deferred Reason deferred Estimated date of resolution  ETOH 11/22/2012    D/C                                                   DISCHARGE CRITERIA:  Adequate post-discharge living arrangements Improved stabilization in mood, thinking, and/or behavior Need for constant or close observation no longer present Reduction of life-threatening or endangering symptoms to within safe limits Withdrawal symptoms are absent or subacute and managed without 24-hour nursing intervention  PRELIMINARY DISCHARGE PLAN: Attend 12-step recovery group patient would like to explore [place]  PATIENT/FAMIILY INVOLVEMENT: This treatment plan has been presented to and reviewed with the patient, [name].  The patient and family have been given the opportunity to ask questions and make suggestions.  Lesa Vandall 11/22/2012, 2:15 PM

## 2012-11-22 NOTE — ED Notes (Signed)
Patient reports increase anxiety. Patient will be medicated per MAR.

## 2012-11-22 NOTE — Progress Notes (Signed)
Patient ID: [name], male   DOB: 18-Feb-1956, 57 y.o.   MRN: [id number] D: pt. Lying in bed "I need my Ativan". Pt. Reports this detox is different because "I think I had enough, I have no idea why I relapse" A: Writer introduced self to client and encouraged him to get up for group. Pt. Reports he was told he didn't have to go to groups today. Staff will monitor q60mn for safety. R: Pt. Is safe on the unit. Pt. In/out of group looking for meds.

## 2012-11-22 NOTE — Consult Note (Signed)
Reason for Consult:Eval for Alcohol detox Referring Physician: [facility] EDP  [name] is an 57 y.o. male.  HPI: Pt is a 57 y/o WM with long standing hx of alcohol abuse and prior participation in detox programs ar [facility]. Pt is seeking alcohol detox at this time and denies any substance abuse induced mood d/o, thus he is denying any SI/SA/HI or AVH. Pt consumes 4- 5 40 oz beers daily but denies consuming whisky or wine.  Patient also denies use of illicit drugs. Past Medical History  Diagnosis Date  . Hepatitis C   . Diabetes mellitus   . Chronic hyponatremia     pt denies having   . Alcoholic   . Alcoholic cirrhosis of liver     Pt calls this a false diagnosis. Pt denies  . Thrombocytopenia   . Hyperglycemia     Past Surgical History  Procedure Laterality Date  . Plate in lower right leg  2008    Family History  Problem Relation Age of Onset  . ALS Father     Social History:  reports that he has been smoking Cigarettes.  He has a 40 pack-year smoking history. He has never used smokeless tobacco. He reports that he drinks about 9.0 ounces of alcohol per week. He reports that he does not use illicit drugs.  Allergies:  Allergies  Allergen Reactions  . Benadryl (Diphenhydramine Hcl)     "Causes nervousness"  . Librium (Chlordiazepoxide Hcl) Anxiety  . Sulfa Antibiotics Rash    Medications: I have reviewed the patient's current medications.  Results for orders placed during the hospital encounter of 11/21/12 (from the past 48 hour(s))  CBC WITH DIFFERENTIAL     Status: Abnormal   Collection Time    11/21/12  9:35 PM      Result Value Range   WBC 6.1  4.0 - 10.5 K/uL   RBC 4.40  4.22 - 5.81 MIL/uL   Hemoglobin 15.4  13.0 - 17.0 g/dL   HCT 42.5  39.0 - 52.0 %   MCV 96.6  78.0 - 100.0 fL   MCH 35.0 (*) 26.0 - 34.0 pg   MCHC 36.2 (*) 30.0 - 36.0 g/dL   RDW 13.9  11.5 - 15.5 %   Platelets 79 (*) 150 - 400 K/uL   Comment: REPEATED TO VERIFY     SPECIMEN CHECKED FOR  CLOTS     PLATELET COUNT CONFIRMED BY SMEAR   Neutrophils Relative % 42 (*) 43 - 77 %   Neutro Abs 2.5  1.7 - 7.7 K/uL   Lymphocytes Relative 44  12 - 46 %   Lymphs Abs 2.7  0.7 - 4.0 K/uL   Monocytes Relative 12  3 - 12 %   Monocytes Absolute 0.7  0.1 - 1.0 K/uL   Eosinophils Relative 1  0 - 5 %   Eosinophils Absolute 0.1  0.0 - 0.7 K/uL   Basophils Relative 1  0 - 1 %   Basophils Absolute 0.1  0.0 - 0.1 K/uL  BASIC METABOLIC PANEL     Status: Abnormal   Collection Time    11/21/12  9:35 PM      Result Value Range   Sodium 133 (*) 135 - 145 mEq/L   Potassium 5.0  3.5 - 5.1 mEq/L   Chloride 95 (*) 96 - 112 mEq/L   CO2 27  19 - 32 mEq/L   Glucose, Bld 206 (*) 70 - 99 mg/dL   BUN 7  6 - 23 mg/dL   Creatinine, Ser 0.55  0.50 - 1.35 mg/dL   Calcium 9.4  8.4 - 10.5 mg/dL   GFR calc non Af Amer >90  >90 mL/min   GFR calc Af Amer >90  >90 mL/min   Comment:            The eGFR has been calculated     using the CKD EPI equation.     This calculation has not been     validated in all clinical     situations.     eGFR's persistently     <90 mL/min signify     possible Chronic Kidney Disease.  ETHANOL     Status: Abnormal   Collection Time    11/21/12  9:35 PM      Result Value Range   Alcohol, Ethyl (B) 362 (*) 0 - 11 mg/dL   Comment:            LOWEST DETECTABLE LIMIT FOR     SERUM ALCOHOL IS 11 mg/dL     FOR MEDICAL PURPOSES ONLY  URINE RAPID DRUG SCREEN (HOSP PERFORMED)     Status: None   Collection Time    11/21/12 10:10 PM      Result Value Range   Opiates NONE DETECTED  NONE DETECTED   Cocaine NONE DETECTED  NONE DETECTED   Benzodiazepines NONE DETECTED  NONE DETECTED   Amphetamines NONE DETECTED  NONE DETECTED   Tetrahydrocannabinol NONE DETECTED  NONE DETECTED   Barbiturates NONE DETECTED  NONE DETECTED   Comment:            DRUG SCREEN FOR MEDICAL PURPOSES     ONLY.  IF CONFIRMATION IS NEEDED     FOR ANY PURPOSE, NOTIFY LAB     WITHIN 5 DAYS.                 LOWEST DETECTABLE LIMITS     FOR URINE DRUG SCREEN     Drug Class       Cutoff (ng/mL)     Amphetamine      1000     Barbiturate      200     Benzodiazepine   716     Tricyclics       967     Opiates          300     Cocaine          300     THC              50    No results found.  Review of Systems  Constitutional: Positive for weight loss, malaise/fatigue and diaphoresis.  Respiratory: Positive for cough.   Gastrointestinal: Positive for heartburn, nausea, vomiting and abdominal pain.  Musculoskeletal: Positive for myalgias.  Neurological: Positive for weakness and headaches.  Psychiatric/Behavioral: Positive for substance abuse. Negative for depression, suicidal ideas, hallucinations and memory loss. The patient is not nervous/anxious and does not have insomnia.        Patient wanting alcohol detox, denies active SI/SA or AVH   Blood pressure 136/80, pulse 78, temperature 98.6 F (37 C), temperature source Oral, resp. rate 16, SpO2 94.00%. Physical Exam  Nursing note and vitals reviewed. Constitutional:  Thin , malnourished cachetic appearing  HENT:  Head: Normocephalic.  Eyes: Pupils are equal, round, and reactive to light.  Neck: Neck supple.  GI: Soft.  Non distened, non tense, or ascitic  Psychiatric:  Somnolent,  arousable, coherent with proper affect. Good eye contact with fait insight and reasoning    Assessment/Plan: 1) Admit to [facility] hall for crises mgmt, safety and stabilization of alcoholic dependence/ detox and acute mgmt of intoxication 2) Social work to support OP alcohol detox programs and support services 3) Mgmt of co-morbid conditions 4) Intensive IP psychotherapy and psychotropic mgmt  Melonee Gerstel E 11/22/2012, 2:20 AM
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
## 2012-11-22 NOTE — Progress Notes (Signed)
[facility] did not get to see patient but will be sending him information about the [facility], using the address provided.

## 2012-11-23 ENCOUNTER — Encounter (HOSPITAL_COMMUNITY): Payer: Self-pay | Admitting: Psychiatry

## 2012-11-23 DIAGNOSIS — F1020 Alcohol dependence, uncomplicated: Principal | ICD-10-CM

## 2012-11-23 LAB — GLUCOSE, CAPILLARY
Glucose-Capillary: 192 mg/dL — ABNORMAL HIGH (ref 70–99)
Glucose-Capillary: 146 mg/dL — ABNORMAL HIGH (ref 70–99)
Glucose-Capillary: 225 mg/dL — ABNORMAL HIGH (ref 70–99)

## 2012-11-23 MED ORDER — LORAZEPAM 1 MG PO TABS: 1.0000 mg | ORAL_TABLET | Freq: Every day | ORAL | Status: DC

## 2012-11-23 MED ORDER — LORAZEPAM 1 MG PO TABS
1.0000 mg | ORAL_TABLET | Freq: Four times a day (QID) | ORAL | Status: DC | PRN
  Administered 2012-11-24 – 2012-11-28 (×12): 1 mg via ORAL
  Filled 2012-11-23 (×12): qty 1

## 2012-11-23 MED ORDER — LORAZEPAM 1 MG PO TABS
1.0000 mg | ORAL_TABLET | Freq: Two times a day (BID) | ORAL | Status: AC
  Administered 2012-11-25 (×2): 1 mg via ORAL
  Filled 2012-11-23 (×3): qty 1

## 2012-11-23 MED ORDER — LORAZEPAM 1 MG PO TABS
1.0000 mg | ORAL_TABLET | Freq: Once | ORAL | Status: AC
  Administered 2012-11-26: 1 mg via ORAL
  Filled 2012-11-23: qty 1

## 2012-11-23 MED ORDER — LORAZEPAM 1 MG PO TABS
1.0000 mg | ORAL_TABLET | Freq: Four times a day (QID) | ORAL | Status: AC
  Administered 2012-11-23 (×3): 1 mg via ORAL
  Filled 2012-11-23 (×2): qty 1

## 2012-11-23 MED ORDER — IBUPROFEN 600 MG PO TABS
600.0000 mg | ORAL_TABLET | Freq: Four times a day (QID) | ORAL | Status: DC | PRN
  Administered 2012-11-23 – 2012-11-26 (×6): 600 mg via ORAL
  Filled 2012-11-23 (×6): qty 1

## 2012-11-23 MED ORDER — LORAZEPAM 1 MG PO TABS
1.0000 mg | ORAL_TABLET | Freq: Three times a day (TID) | ORAL | Status: AC
  Administered 2012-11-24 (×3): 1 mg via ORAL
  Filled 2012-11-23 (×3): qty 1

## 2012-11-23 NOTE — BHH Group Notes (Signed)
[facility] LCSW Group Therapy  11/23/2012 2:42 PM  Type of Therapy:  Group Therapy  Participation Level:  Did Not Attend  Smart, Arlen Dupuis 11/23/2012, 2:42 PM

## 2012-11-23 NOTE — Progress Notes (Signed)
Psychoeducational Group Note  Date:  11/23/2012 Time:  1100  Group Topic/Focus:  Overcoming Stress:   The focus of this group is to define stress and help patients assess their triggers.  Participation Level: Did Not Attend  Participation Quality:  Not Applicable  Affect:  Not Applicable  Cognitive:  Not Applicable  Insight:  Not Applicable  Engagement in Group: Not Applicable  Additional Comments:  Patient did not attend group, patient remained in bed.   [name] [name] 11/23/2012, 6:16 PM

## 2012-11-23 NOTE — Progress Notes (Signed)
Recreation Therapy Notes  Date: 07.17.2014 Time: 3:00pm Location: [address] Dayroom      Group Topic/Focus: Goal Setting   Participation Level: Did not attend  [employer], LRT/CTRS  Lane Hacker 11/23/2012 4:18 PM

## 2012-11-23 NOTE — BHH Group Notes (Signed)
[facility] LCSW Aftercare Discharge Planning Group Note   11/23/2012 9:35 AM  Participation Quality:  DID NOT ATTEND    [name], [name] Conn

## 2012-11-23 NOTE — BHH Counselor (Signed)
Adult Psychosocial Assessment Update Interdisciplinary Team  Previous [facility] admissions/discharges:  Admissions Discharges  Date: 11/22/12 Date:  Date: 09/27/12 Date: 09/29/12  Date: 01/29/12 Date: 02/02/12  Date: 12/21/11 Date: 12/25/11  Date: 11/06/11 Date: 11/11/11  Date: 01/30/11     Date: 02/05/11  Changes since the last Psychosocial Assessment (including adherence to outpatient mental health and/or substance abuse treatment, situational issues contributing to decompensation and/or relapse).  Pt reports he has been drinking at least four 40-oz beers daily for one month. He reports he relapse upon [facility] approximately one month ago. He reports a history of withdrawal symptoms when trying to detox including irritability, cramps, nausea, vomiting and tremors. He reports a history of blackouts but denies any history of seizures. He denies any other substance use and UDS is negative. Pt denies depressive symptoms except related to his alcohol dependence. He reports poor appetite and a 15 lb weight loss due to drinking. He denies suicidal ideation or a history of suicide attempts. He denies any history of intentional self-injurious behavior. He denies any homicidal ideation or history of violence. He denies any past or current psychotic symptoms. Pt states his primary stressor is his alcohol dependence. He also states he is having financial problems and is being evicted from his apartment. He says he would like to go to an [facility] following his detox from alcohol. He states there is a family history of alcohol abuse on both sides. He states he has a girlfriend and friends from [place] who are supportive. He states he is currently on probation due to "trying to help somebody out with a check" but denies any pending legal charges.             Discharge Plan 1. Will you be returning to the same living situation after discharge?   Yes: No:      If no, what is  your plan?    Pt hoping to get into [facility] and plans to follow up o/p for med management and possible therapy services.        2. Would you like a referral for services when you are discharged? Yes:     If yes, for what services?  No:       Pt will likely follow up at [facility] for med management. CSW and pt currently exploring therapy options.        Summary and Recommendations (to be completed by the evaluator) [name] is an 58 y.o. male, divorced, Caucasian who presents to [facility] via EMS due to alcohol intoxication. Pt has a long history of alcohol dependence and is requesting inpatient detox.When asked why he is seeking treatment at this time he states he needs to get sober because he is being evicted from his apartment and he is afraid of going through alcohol withdrawal. Recommendations for pt include: crisis stabilization, ETOH detox (ativan taper due to allergy to librium), therapeutic milieu, encourage group attendance and participation, medication management for mood stabilization, and development of comprehensive mental wellness/sobreity plan. Pt hopes to get into [facility] after d/c from [facility].                        Signature:  Maxie Better, 11/23/2012 12:31 PM

## 2012-11-23 NOTE — Progress Notes (Signed)
Patient ID: ELVAN EBRON, male   DOB: 1955/09/21, 57 y.o.   MRN: [id number] D: pt. Lying in bed, focus only on when next Ativan will be given. Pt. Reports "I'm going through detox, I been sleeping that ETOH coming out of me" "it take 1 to 1 1/2 days to get out, then I'll be on the way back to recovery." A: Writer encouraged pt. Go get up and get some exercise. Staff will monitor q43mn for safety. Pt. Encouraged to go to [employer] R: pt. Is safe on the unit, refused karaoke. Pt. Got scheduled dose of Ativan and already calculating when the next dose will be given.

## 2012-11-23 NOTE — Progress Notes (Signed)
Patient did not attend the evening karaoke group. Pt remained in bed during group.

## 2012-11-23 NOTE — BHH Suicide Risk Assessment (Signed)
Suicide Risk Assessment  Admission Assessment     Nursing information obtained from:  Patient Demographic factors:  Male;Caucasian;Living alone Current Mental Status:    Loss Factors:  Financial problems / change in socioeconomic status Historical Factors:  Family history of mental illness or substance abuse;Domestic violence in family of origin Risk Reduction Factors:  Sense of responsibility to family  CLINICAL FACTORS:   Alcohol/Substance Abuse/Dependencies  COGNITIVE FEATURES THAT CONTRIBUTE TO RISK:  Closed-mindedness Polarized thinking Thought constriction (tunnel vision)    SUICIDE RISK:   Moderate:  Frequent suicidal ideation with limited intensity, and duration, some specificity in terms of plans, no associated intent, good self-control, limited dysphoria/symptomatology, some risk factors present, and identifiable protective factors, including available and accessible social support.  PLAN OF CARE: Supportive approach/coping skills/relapse prevention                               Reassess and address the co morbidites  I certify that inpatient services furnished can reasonably be expected to improve the patient's condition.  Oretha Weismann A 11/23/2012, 3:03 PM

## 2012-11-23 NOTE — Progress Notes (Signed)
11-23-12  NSG NOTE  7a-7p  D: Affect is blunted and depressed.  Mood is depressed.  Behavior is appropriate with encouragement, direction and support.  Interacts appropriately with peers and staff.  Has to be prompted to leave room at times.  Reports fair sleep, poor appetite, low energy level and improving ability to pay attention.  Pt stated he understands that he has to not hang out with the same friends to be successful post discharge.   A:  Medications per MD order.  Support given throughout day.  1:1 time spent with pt.  R:  Following treatment plan.  Denies HI/SI, auditory or visual hallucinations.  Contracts for safety.

## 2012-11-23 NOTE — H&P (Signed)
Agree with assessment.

## 2012-11-23 NOTE — H&P (Signed)
Psychiatric Admission Assessment Adult  Patient Identification:  [name]  Date of Evaluation:  11/23/2012  Chief Complaint:  MAJOR DEPRESSIVE DISORDER  History of Present Illness: This is a 57 year old Caucasian male. Admitted to [facility] from the [facility] ED with complaints of alcohol intoxication requesting detox. Patient reports, "The ambulance took me to the hospital 2 days ago. I called the ambulance myself because I know that I have to do something about my alcoholism. I just made a decision to stop this alcoholism, or it will kill. My liver is done messed up. I have been drinking heavily x 4 months. I have gone to a lot of treatment places, after I get out, I will be back to the same old stuff in no time. I was at [facility] in [place], [place] 1 month ago. I relapsed 2 days after discharge from [facility]. Drinking alcohol makes me feel normal. If I did drink alcohol, I stagger around, falling like no body has. My longest sobriety was 6 years. I relapsed after I got divorced and get to be feeling bad. Right now, I feel cold, warm, chills, sweats, stomach cramps and diarrhea".   Elements:  Location:  [facility] adult unit. Quality:  Cravings, drinking 4 (40 oz) bottle of beer daily x 1 month . Severity:  Severe. Timing:  1 months. Duration:  Drinking for a long time. Context:  Increased anxiety, cravings, shakes.  Associated Signs/Synptoms:  Depression Symptoms:  insomnia, psychomotor agitation, feelings of worthlessness/guilt, hopelessness, anxiety,  (Hypo) Manic Symptoms:  Impulsivity, Irritable Mood,  Anxiety Symptoms:  Excessive Worry,  Psychotic Symptoms:  Hallucinations: Denies  PTSD Symptoms: Had a traumatic exposure:  None reported  Psychiatric Specialty Exam: Physical Exam  Constitutional: He is oriented to person, place, and time. He appears well-developed.  HENT:  Head: Normocephalic.  Eyes: Pupils are equal, round, and reactive to light.  Neck: Normal range of  motion.  Cardiovascular: Normal rate.   Respiratory: Effort normal.  GI: Soft.  Musculoskeletal: Normal range of motion.  Neurological: He is alert and oriented to person, place, and time.  Skin: Skin is warm and dry.  Psychiatric: His speech is normal and behavior is normal. Thought content normal. His mood appears anxious (Rated #5). His affect is not angry. Cognition and memory are normal. He expresses impulsivity. He does not exhibit a depressed mood.    Review of Systems  Constitutional: Positive for chills, malaise/fatigue and diaphoresis.  HENT: Negative.   Eyes: Negative.   Respiratory: Negative.   Cardiovascular: Negative.   Gastrointestinal: Negative.   Genitourinary: Negative.   Musculoskeletal: Positive for myalgias.  Skin: Negative.   Neurological: Positive for tremors and weakness.  Endo/Heme/Allergies: Negative.   Psychiatric/Behavioral: Positive for substance abuse (Alcoholism). Negative for suicidal ideas, hallucinations and memory loss. The patient is nervous/anxious (Rated #5) and has insomnia.     Blood pressure 112/73, pulse 106, temperature 98.2 F (36.8 C), temperature source Oral, resp. rate 16.There is no weight on file to calculate BMI.  General Appearance: Disheveled  Eye Sport and exercise psychologist::  Fair  Speech:  Clear and Coherent  Volume:  Normal  Mood:  Anxious  Affect:  Congruent  Thought Process:  Coherent and Intact  Orientation:  Full (Time, Place, and Person)  Thought Content:  Rumination  Suicidal Thoughts:  No  Homicidal Thoughts:  No  Memory:  Immediate;   Good Recent;   Good Remote;   Good  Judgement:  Fair  Insight:  Fair  Psychomotor Activity:  Tremor  Concentration:  Fair  Recall:  Good  Akathisia:  No  Handed:  Right  AIMS (if indicated):     Assets:  Desire for Improvement  Sleep:  Number of Hours: 5.5    Past Psychiatric History: Diagnosis: Alcohol dependence,   Hospitalizations: [facility]   Outpatient Care: None reported  Substance Abuse  Care: [facility], 1 month ago  Self-Mutilation: Denies  Suicidal Attempts: Denies attempts and or thoughts  Violent Behaviors: None reported   Past Medical History:   Past Medical History  Diagnosis Date  . Hepatitis C   . Diabetes mellitus   . Chronic hyponatremia     pt denies having   . Alcoholic   . Alcoholic cirrhosis of liver     Pt calls this a false diagnosis. Pt denies  . Thrombocytopenia   . Hyperglycemia    Cardiac History:  Thrombocytopenia  Allergies:   Allergies  Allergen Reactions  . Benadryl (Diphenhydramine Hcl)     "Causes nervousness"  . Librium (Chlordiazepoxide Hcl) Anxiety  . Sulfa Antibiotics Rash   PTA Medications: Prescriptions prior to admission  Medication Sig Dispense Refill  . metFORMIN (GLUCOPHAGE-XR) 750 MG 24 hr tablet Take 1 tablet (750 mg total) by mouth 2 (two) times daily with a meal.  60 tablet  0    Previous Psychotropic Medications:  Medication/Dose  See Medication lists               Substance Abuse History in the last 12 months:  yes  Consequences of Substance Abuse: Medical Consequences:  Liver damage, Possible death by overdose Legal Consequences:  Arrests, jail time, Loss of driving privilege. Family Consequences:  Family discord, divorce and or separation.  Social History:  reports that he has been smoking Cigarettes.  He has a 40 pack-year smoking history. He has never used smokeless tobacco. He reports that he drinks about 9.0 ounces of alcohol per week. He reports that he does not use illicit drugs. Additional Social History: History of alcohol / drug use?: Yes  Current Place of Residence: [place], [place] of Birth: [place], [place]   Family Members: "My 3 grown boys"  Marital Status:  Single  Children: 3  Sons: 3  Daughters: 0  Relationships: Single  Education:  [employer] Charity fundraiser Problems/Performance: Completed high school  Religious Beliefs/Practices: NA  History of Abuse  (Emotional/Phsycial/Sexual): Denies  Occupational Experiences: Employed Therapist, occupational History:  None.  Legal History: On probation for bad checks  Hobbies/Interests: None reported  Family History:   Family History  Problem Relation Age of Onset  . ALS Father     Results for orders placed during the hospital encounter of 11/22/12 (from the past 72 hour(s))  GLUCOSE, CAPILLARY     Status: Abnormal   Collection Time    11/22/12  5:04 PM      Result Value Range   Glucose-Capillary 228 (*) 70 - 99 mg/dL  GLUCOSE, CAPILLARY     Status: Abnormal   Collection Time    11/22/12  9:06 PM      Result Value Range   Glucose-Capillary 192 (*) 70 - 99 mg/dL  GLUCOSE, CAPILLARY     Status: Abnormal   Collection Time    11/23/12  6:17 AM      Result Value Range   Glucose-Capillary 199 (*) 70 - 99 mg/dL   Psychological Evaluations:  Assessment:   AXIS I:  Alcohol dependence, AXIS II:  Deferred AXIS III:  Past Medical History  Diagnosis Date  . Hepatitis C   . Diabetes mellitus   . Chronic hyponatremia     pt denies having   . Alcoholic   . Alcoholic cirrhosis of liver     Pt calls this a false diagnosis. Pt denies  . Thrombocytopenia   . Hyperglycemia    AXIS IV:  housing problems and other psychosocial or environmental problems AXIS V:  1-10 persistent dangerousness to self and others present  Treatment Plan/Recommendations: 1. Admit for crisis management and stabilization, estimated length of stay 3-5 days.  2. Medication management to reduce current symptoms to base line and improve the patient's overall level of functioning  3. Treat health problems as indicated.  4. Develop treatment plan to decrease risk of relapse upon discharge and the need for readmission.  5. Psycho-social education regarding relapse prevention and self care.  6. Health care follow up as needed for medical problems.  7. Review, reconcile, and reinstate any pertinent home medications for other  health issues where appropriate. 8. Call for consults with hospitalist for any additional specialty patient care services as needed.  Treatment Plan Summary: Daily contact with patient to assess and evaluate symptoms and progress in treatment Medication management Supportive approach/coping skills/CBT/Optimize treatment with psychotropics Current Medications:  Current Facility-Administered Medications  Medication Dose Route Frequency Provider Last Rate Last Dose  . acetaminophen (TYLENOL) tablet 650 mg  650 mg Oral Q6H PRN Laverle Hobby, PA-C   650 mg at 11/23/12 0038  . hydrOXYzine (ATARAX/VISTARIL) tablet 25 mg  25 mg Oral Q6H PRN Laverle Hobby, PA-C      . ibuprofen (ADVIL,MOTRIN) tablet 600 mg  600 mg Oral Q6H PRN Laverle Hobby, PA-C   600 mg at 11/23/12 0450  . insulin aspart (novoLOG) injection 0-15 Units  0-15 Units Subcutaneous TID WC Encarnacion Slates, NP   3 Units at 11/23/12 0701  . insulin aspart (novoLOG) injection 0-5 Units  0-5 Units Subcutaneous QHS Encarnacion Slates, NP      . loperamide (IMODIUM) capsule 2-4 mg  2-4 mg Oral PRN Laverle Hobby, PA-C      . LORazepam (ATIVAN) tablet 2 mg  2 mg Oral Q6H PRN Laverle Hobby, PA-C   2 mg at 11/23/12 0429  . magnesium hydroxide (MILK OF MAGNESIA) suspension 30 mL  30 mL Oral Daily PRN Laverle Hobby, PA-C      . metFORMIN (GLUCOPHAGE-XR) 24 hr tablet 750 mg  750 mg Oral BID WC Encarnacion Slates, NP   750 mg at 11/22/12 1731  . multivitamin with minerals tablet 1 tablet  1 tablet Oral Daily Laverle Hobby, PA-C      . thiamine (B-1) injection 100 mg  100 mg Intramuscular Once Spencer E Simon, PA-C      . thiamine (VITAMIN B-1) tablet 100 mg  100 mg Oral Daily Spencer E Simon, PA-C      . traZODone (DESYREL) tablet 50 mg  50 mg Oral QHS,MR X 1 Spencer E Simon, PA-C   50 mg at 11/22/12 2347    Observation Level/Precautions:  15 minute checks  Laboratory:  Reviewed ED lab findings on file  Psychotherapy:  Group sessions, AA/NA  meetings  Medications: See medication lists   Consultations: As needed   Discharge Concerns:  Maintaining sobriety  Estimated LOS: 3-5 days  Other:     I certify that inpatient services furnished can reasonably be expected to improve the patient's condition.  Encarnacion Slates, PMHNP-BC, FNP 7/17/201410:21 AM  Agree with assessment and plan Woodroe Chen. [name], M.D.

## 2012-11-24 LAB — GLUCOSE, CAPILLARY: Glucose-Capillary: 216 mg/dL — ABNORMAL HIGH (ref 70–99)

## 2012-11-24 MED ORDER — ENSURE COMPLETE PO LIQD
237.0000 mL | Freq: Two times a day (BID) | ORAL | Status: DC
  Administered 2012-11-24 – 2012-11-27 (×4): 237 mL via ORAL

## 2012-11-24 NOTE — Tx Team (Signed)
Interdisciplinary Treatment Plan Update (Adult)  Date: 11/24/2012   Time Reviewed: 10:50 AM  Progress in Treatment:  Attending groups: No.  Participating in groups:  No.  Taking medication as prescribed: Yes  Tolerating medication: Yes  Family/Significant othe contact made: No. SPE not required for this pt.  Patient understands diagnosis: Yes, AEB seeking treatment for ETOH detox,  Discussing patient identified problems/goals with staff: Yes  Medical problems stabilized or resolved: Yes  Denies suicidal/homicidal ideation: Yes, during admission and self report.  Patient has not harmed self or Others: Yes  New problem(s) identified: Pt will not come to groups at this time.  Discharge Plan or Barriers: Pt is not attending d/c planning group. He is interested in [facility] placement and will likely follow up at [facility] for med management. CSW to continue exploring aftercare options with pt.  Additional comments: [name] is an 56 y.o. male, divorced, Caucasian who presents to [facility] via EMS due to alcohol intoxication. Pt has a long history of alcohol dependence and is requesting inpatient detox. He had a blood alcohol of 362 when he arrived on 11/21/12 at 2135. When asked why he is seeking treatment at this time he states he needs to get sober because he is being evicted from his apartment and he is afraid of going through alcohol withdrawal. Pt reports he has been drinking at least four 40-oz beers daily for one month. He reports he relapse upon discharge from [facility] approximately one month ago. He reports a history of withdrawal symptoms when trying to detox including irritability, cramps, nausea, vomiting and tremors. He reports a history of blackouts but denies any history of seizures. He denies any other substance use and UDS is negative. Pt denies depressive symptoms except related to his alcohol dependence. He reports poor appetite and a 15 lb weight loss due to drinking. He denies  suicidal ideation or a history of suicide attempts. He denies any history of intentional self-injurious behavior. He denies any homicidal ideation or history of violence. He denies any past or current psychotic symptoms.  Pt states his primary stressor is his alcohol dependence. He also states he is having financial problems and is being evicted from his apartment. He says he would like to go to an [facility] following his detox from alcohol. He states there is a family history of alcohol abuse on both sides. He states he has a girlfriend and friends from [place] who are supportive. He states he is currently on probation due to "trying to help somebody out with a check" but denies any pending legal charges. Reason for Continuation of Hospitalization: Ativan taper-ETOH detox Mood stabilization Estimated length of stay: 2-3 days (likely d/c Monday) For review of initial/current patient goals, please see plan of care.  Attendees:  Patient:    Family:    Physician: Carlton Adam MD 11/24/2012 10:52 AM   Nursing:    Clinical Social Worker [place], [place]  11/24/2012 10:52 AM   Other: [name] Lemming LCSWA  11/24/2012 10:52 AM   Other:  Aggie PA 11/24/2012 10:53 AM   Other: Gerline Legacy Nurse RN 11/24/2012 10:53 AM   Other:    Scribe for Treatment Team:  [place] LCSWA 11/24/2012 10:53 AM

## 2012-11-24 NOTE — Progress Notes (Signed)
Adult Psychoeducational Group Note  Date:  11/24/2012 Time:  1:23 PM  Group Topic/Focus:  Relapse Prevention Planning:   The focus of this group is to define relapse and discuss the need for planning to combat relapse.  Participation Level:  None  Participation Quality:  None.  Affect:  None.   Cognitive:  None.  Insight: None  Engagement in Group:  None  Modes of Intervention:  Discussion and Exploration  Additional Comments:  Pt did not attend group.  Estell Harpin K 11/24/2012, 1:23 PM

## 2012-11-24 NOTE — BHH Group Notes (Signed)
[facility] LCSW Group Therapy  11/24/2012 2:54 PM  Type of Therapy:  Group Therapy  Participation Level:  Did Not Attend  Smart, Nira Conn 11/24/2012, 2:54 PM

## 2012-11-24 NOTE — Progress Notes (Signed)
Patient ID: [name], male   DOB: 05-Nov-1955, 57 y.o.   MRN: [id number] Patient states that he is having a difficult time withdrawing.  Patient has tremors, stomach discomfort and anxiety.  He is electing not to go to the cafeteria.  He denies any SI/HI/AVH.  He isolates to his room and interacts minimally with staff and others.  He is not attending any groups or participating in his treatment.    Continue to monitor vitals and withdrawal symptoms.  Monitor medication management and MD orders.    Patient remains isolative.

## 2012-11-24 NOTE — BHH Group Notes (Signed)
[facility] LCSW Aftercare Discharge Planning Group Note   11/24/2012 9:18 AM  Participation Quality:  DID NOT ATTEND  Smart, Nira Conn

## 2012-11-24 NOTE — BHH Suicide Risk Assessment (Signed)
[place] INPATIENT: Family/Significant Other Suicide Prevention Education  Suicide Prevention Education:  Education Completed; No one has been identified by the patient as the family member/significant other with whom the patient will be residing, and identified as the person(s) who will aid the patient in the event of a mental health crisis (suicidal ideations/suicide attempt).   Pt did not c/o SI at admission, nor have they endorsed SI during their stay here. SPE not required. Pt still provided with SPI pamphlet and encouraged to ask questions/talk about any concerns relating to SPE.   [place], [place] 11/24/2012 12:49 PM

## 2012-11-24 NOTE — Progress Notes (Signed)
[facility] MD Progress Note  11/24/2012 3:13 PM [name]  MRN:  [id number]  Subjective:  [name] is having a hard time with withdrawal symptoms. He is endorsing tremors, Nausea, poor appetite, abdominal cramps and high anxiety. Hopes that he can start eating something and hold it down, then he start to feel better. He is currently drinking ensure supplement. He rated his anxiety at #7. Denies SIHI. Has not been able to attend group sessions yet. He facial areas are flushed.  Diagnosis:   Axis I:  Alcohol dependence Axis II: Deferred Axis III:  Past Medical History  Diagnosis Date  . Hepatitis C   . Diabetes mellitus   . Chronic hyponatremia     pt denies having   . Alcoholic   . Alcoholic cirrhosis of liver     Pt calls this a false diagnosis. Pt denies  . Thrombocytopenia   . Hyperglycemia    Axis IV: Chronic alcoholism Axis V: 41-50 serious symptoms  ADL's:  Impaired  Sleep: Poor  Appetite:  Poor  Suicidal Ideation:  Plan:  Denies Intent:  Denies Means:  Denies Homicidal Ideation:  Plan:  Denies  Intent:  Denies Means:  Denies  AEB (as evidenced by):  Psychiatric Specialty Exam: Review of Systems  Constitutional: Positive for chills, malaise/fatigue and diaphoresis.  HENT: Negative.   Eyes: Negative.   Respiratory: Negative.   Cardiovascular: Negative.   Gastrointestinal: Positive for nausea, abdominal pain and diarrhea.  Genitourinary: Negative.   Musculoskeletal: Positive for myalgias.  Skin: Negative.        Facial flushing  Neurological: Positive for dizziness, tremors and weakness.  Endo/Heme/Allergies: Negative.   Psychiatric/Behavioral: Positive for substance abuse (Alcoholism). Negative for depression, suicidal ideas, hallucinations and memory loss. The patient is nervous/anxious (Currently being stabilized with medication) and has insomnia (Currently being stabilized with medication).     Blood pressure 124/74, pulse 82, temperature 97.9 F (36.6  C), temperature source Oral, resp. rate 18.There is no weight on file to calculate BMI.  General Appearance: Disheveled  Eye Sport and exercise psychologist::  Fair  Speech:  Clear and Coherent  Volume:  Normal  Mood:  Anxious  Affect:  Flat  Thought Process:  Coherent and Intact  Orientation:  Full (Time, Place, and Person)  Thought Content:  Rumination  Suicidal Thoughts:  No  Homicidal Thoughts:  No  Memory:  Immediate;   Good Recent;   Good Remote;   Good  Judgement:  Fair  Insight:  Fair  Psychomotor Activity:  Restlessness and Tremor  Concentration:  Fair  Recall:  Good  Akathisia:  No  Handed:  Right  AIMS (if indicated):     Assets:  Communication Skills Desire for Improvement  Sleep:  Number of Hours: 3.75   Current Medications: Current Facility-Administered Medications  Medication Dose Route Frequency Provider Last Rate Last Dose  . acetaminophen (TYLENOL) tablet 650 mg  650 mg Oral Q6H PRN Laverle Hobby, PA-C   650 mg at 11/24/12 0753  . feeding supplement (ENSURE COMPLETE) liquid 237 mL  237 mL Oral BID BM Dagmar Hait, RD   237 mL at 11/24/12 1444  . hydrOXYzine (ATARAX/VISTARIL) tablet 25 mg  25 mg Oral Q6H PRN Laverle Hobby, PA-C      . ibuprofen (ADVIL,MOTRIN) tablet 600 mg  600 mg Oral Q6H PRN Laverle Hobby, PA-C   600 mg at 11/24/12 0052  . insulin aspart (novoLOG) injection 0-15 Units  0-15 Units Subcutaneous TID WC Encarnacion Slates, NP  5 Units at 11/24/12 1216  . insulin aspart (novoLOG) injection 0-5 Units  0-5 Units Subcutaneous QHS Encarnacion Slates, NP   2 Units at 11/23/12 2240  . loperamide (IMODIUM) capsule 2-4 mg  2-4 mg Oral PRN Laverle Hobby, PA-C      . LORazepam (ATIVAN) tablet 1 mg  1 mg Oral TID Encarnacion Slates, NP   1 mg at 11/24/12 1217  . [START ON 11/25/2012] LORazepam (ATIVAN) tablet 1 mg  1 mg Oral BID Encarnacion Slates, NP      . Derrill Memo ON 11/26/2012] LORazepam (ATIVAN) tablet 1 mg  1 mg Oral Once Encarnacion Slates, NP      . LORazepam (ATIVAN) tablet 1 mg  1 mg  Oral Q6H PRN Nicholaus Bloom, MD   1 mg at 11/24/12 0052  . magnesium hydroxide (MILK OF MAGNESIA) suspension 30 mL  30 mL Oral Daily PRN Laverle Hobby, PA-C      . metFORMIN (GLUCOPHAGE-XR) 24 hr tablet 750 mg  750 mg Oral BID WC Encarnacion Slates, NP   750 mg at 11/24/12 0752  . multivitamin with minerals tablet 1 tablet  1 tablet Oral Daily Laverle Hobby, PA-C   1 tablet at 11/24/12 8546  . thiamine (B-1) injection 100 mg  100 mg Intramuscular Once [employer], PA-C      . thiamine (VITAMIN B-1) tablet 100 mg  100 mg Oral Daily Laverle Hobby, PA-C   100 mg at 11/24/12 0752  . traZODone (DESYREL) tablet 50 mg  50 mg Oral QHS,MR X 1 Laverle Hobby, PA-C   50 mg at 11/24/12 2703    Lab Results:  Results for orders placed during the hospital encounter of 11/22/12 (from the past 48 hour(s))  GLUCOSE, CAPILLARY     Status: Abnormal   Collection Time    11/22/12  5:04 PM      Result Value Range   Glucose-Capillary 228 (*) 70 - 99 mg/dL  GLUCOSE, CAPILLARY     Status: Abnormal   Collection Time    11/22/12  9:06 PM      Result Value Range   Glucose-Capillary 192 (*) 70 - 99 mg/dL  GLUCOSE, CAPILLARY     Status: Abnormal   Collection Time    11/23/12  6:17 AM      Result Value Range   Glucose-Capillary 199 (*) 70 - 99 mg/dL  GLUCOSE, CAPILLARY     Status: Abnormal   Collection Time    11/23/12 12:26 PM      Result Value Range   Glucose-Capillary 283 (*) 70 - 99 mg/dL  GLUCOSE, CAPILLARY     Status: Abnormal   Collection Time    11/23/12  5:21 PM      Result Value Range   Glucose-Capillary 146 (*) 70 - 99 mg/dL   Comment 1 Notify RN     Comment 2 Documented in Chart    GLUCOSE, CAPILLARY     Status: Abnormal   Collection Time    11/23/12  9:55 PM      Result Value Range   Glucose-Capillary 225 (*) 70 - 99 mg/dL   Comment 1 Notify RN     Comment 2 Documented in Chart    GLUCOSE, CAPILLARY     Status: Abnormal   Collection Time    11/24/12  5:54 AM      Result Value Range    Glucose-Capillary 183 (*) 70 - 99 mg/dL  Comment 1 Notify RN    GLUCOSE, CAPILLARY     Status: Abnormal   Collection Time    11/24/12 12:10 PM      Result Value Range   Glucose-Capillary 216 (*) 70 - 99 mg/dL   Comment 1 Documented in Chart      Physical Findings: AIMS: Facial and Oral Movements Muscles of Facial Expression: None, normal Lips and Perioral Area: None, normal Jaw: None, normal Tongue: None, normal,Extremity Movements Upper (arms, wrists, hands, fingers): None, normal Lower (legs, knees, ankles, toes): None, normal, Trunk Movements Neck, shoulders, hips: None, normal, Overall Severity Severity of abnormal movements (highest score from questions above): None, normal Incapacitation due to abnormal movements: None, normal Patient's awareness of abnormal movements (rate only patient's report): No Awareness, Dental Status Current problems with teeth and/or dentures?: No Does patient usually wear dentures?: No  CIWA:  CIWA-Ar Total: 4 COWS:  COWS Total Score: 1  Treatment Plan Summary: Daily contact with patient to assess and evaluate symptoms and progress in treatment Medication management  Plan: Supportive approach/coping skills/relapse prevention. Continue detox treatment. Encouraged out of room, participation in group sessions and application of coping skills when distressed. Will continue to monitor response to/adverse effects of medications in use to assure effectiveness. Continue to monitor mood, behavior and interaction with staff and other patients. Continue current plan of care.  Medical Decision Making Problem Points:  Review of last therapy session (1) and Review of psycho-social stressors (1) Data Points:  Review of medication regiment & side effects (2) Review of new medications or change in dosage (2)  I certify that inpatient services furnished can reasonably be expected to improve the patient's condition.   Encarnacion Slates, FNP,  PMHNP-BC 11/24/2012, 3:13 PM

## 2012-11-24 NOTE — Progress Notes (Signed)
Nutrition Note  Patient identified on the Malnutrition Screening Tool (MST) Report Ht: 6'0'' Wt: 156 lbs IBW: 178 lbs BMI: 21.2 Patient meets criteria for normal based on current BMI.   Pt reports that he had lost weight PTA, but that he has been gaining weight now that he is [facility]. He reports that his weight dropped to 153 lbs, and that he is happiest at 170 lbs. He says that he has been eating well, but that he does not like the amount of food that is given to him because he feels that it is too much. He reports having a better appetite. PTA, pt had been substituting food for ETOH. He says that he has tried ensure before and that he would like to have it sent to him while admitted.   Nutritional Diagnosis: Inadequate oral intake related to alcoholism as evidenced by weight loss; improving  Intervention: -Discussed the importance of a healthy diet using teach-back method. -Ensure Complete po BID, each supplement provides 350 kcal and 13 grams of protein.  Monitor: Weight, po intake, labs  Current diet order is carb mod. Labs and medications reviewed.   If further nutrition issues arise, please consult RD.   Terrace Arabia RD, LDN

## 2012-11-24 NOTE — Progress Notes (Signed)
Patient ID: [name], male   DOB: 15-Aug-1955, 57 y.o.   MRN: [id number] D)  Has been out on the hall and participating in the milieu this evening, attended AA group, discussed his elevated blood sugars and that glucerna would be a better choice.  Did not require insulin coverage this evening for hs reading of 154.  Stated was feeling anxious this evening r/t w/d, but no c/o's pain.  Came to med window for hs meds, interacted with peers after group in the dayroom before going to bed. A)  Will continue to monitor for safety, continue POC R)  Safety maintained.

## 2012-11-25 DIAGNOSIS — F10239 Alcohol dependence with withdrawal, unspecified: Secondary | ICD-10-CM

## 2012-11-25 LAB — GLUCOSE, CAPILLARY
Glucose-Capillary: 154 mg/dL — ABNORMAL HIGH (ref 70–99)
Glucose-Capillary: 212 mg/dL — ABNORMAL HIGH (ref 70–99)
Glucose-Capillary: 214 mg/dL — ABNORMAL HIGH (ref 70–99)
Glucose-Capillary: 180 mg/dL — ABNORMAL HIGH (ref 70–99)
Glucose-Capillary: 264 mg/dL — ABNORMAL HIGH (ref 70–99)

## 2012-11-25 NOTE — Progress Notes (Signed)
Patient ID: [name], male   DOB: 01/23/1956, 57 y.o.   MRN: [id number]  D: Patient has been very flat and depressed on the unit.  Pt reported that he is very depressed and very hopeless/helpless. Pt reported that he was negative SI/HI, no AH/VH noted. Pt also did not attend any groups and did not engage in treatment. A: 15 min checks continued for patient safety. R: Pt safety maintained.

## 2012-11-25 NOTE — Progress Notes (Signed)
Patient did attend the last half of the evening speaker [place] meeting.

## 2012-11-25 NOTE — BHH Group Notes (Signed)
[facility] Group Notes: (Clinical Social Work)   11/25/2012      Type of Therapy:  Group Therapy   Participation Level:  Did Not Attend    Selmer Dominion, LCSW 11/25/2012, 12:11 PM

## 2012-11-25 NOTE — Progress Notes (Signed)
[facility] MD Progress Note  11/25/2012 2:29 PM [name]  MRN:  [id number] Subjective:  States that he continues to have a hard time. States that this withdrawal has been rougher than other times. Still shaky, still sweating. Admits he has no energy to do anything, has been in bed. Admit he has been  drinking more. He is thinking of going to an [employer] this time around. Does not think he is going to make it if he was to go back to where he was.  Diagnosis:  Alcohol Dependence/withdrawal  ADL's:  Intact  Sleep: Poor  Appetite:  Poor  Suicidal Ideation:  Plan:  denies Intent:  denies Means:  denies Homicidal Ideation:  Plan:  denies Intent:  denies Means:  denies AEB (as evidenced by):  Psychiatric Specialty Exam: Review of Systems  Constitutional: Positive for diaphoresis.  HENT: Negative.   Eyes: Negative.   Respiratory: Negative.   Cardiovascular: Negative.   Gastrointestinal: Negative.   Genitourinary: Negative.   Musculoskeletal: Negative.   Skin: Negative.   Neurological: Positive for tremors.  Endo/Heme/Allergies: Negative.   Psychiatric/Behavioral: Positive for substance abuse. The patient is nervous/anxious.     Blood pressure 161/83, pulse 69, temperature 97.9 F (36.6 C), temperature source Oral, resp. rate 16.There is no weight on file to calculate BMI.  General Appearance: Disheveled  Eye Sport and exercise psychologist::  Fair  Speech:  Clear and Coherent, Slow and not spontaneous  Volume:  Decreased  Mood:  Anxious, Depressed and worried  Affect:  worried  Thought Process:  Coherent and Goal Directed  Orientation:  Full (Time, Place, and Person)  Thought Content:  worries, concerns  Suicidal Thoughts:  No  Homicidal Thoughts:  No  Memory:  Immediate;   Fair Recent;   Fair Remote;   Fair  Judgement:  Fair  Insight:  Present  Psychomotor Activity:  Psychomotor Retardation  Concentration:  Fair  Recall:  Fair  Akathisia:  No  Handed:  Right  AIMS (if indicated):      Assets:  Desire for Improvement  Sleep:  Number of Hours: 3.25   Current Medications: Current Facility-Administered Medications  Medication Dose Route Frequency Provider Last Rate Last Dose  . acetaminophen (TYLENOL) tablet 650 mg  650 mg Oral Q6H PRN Laverle Hobby, PA-C   650 mg at 11/24/12 0753  . feeding supplement (ENSURE COMPLETE) liquid 237 mL  237 mL Oral BID BM Dagmar Hait, RD   237 mL at 11/24/12 1444  . ibuprofen (ADVIL,MOTRIN) tablet 600 mg  600 mg Oral Q6H PRN Laverle Hobby, PA-C   600 mg at 11/25/12 0335  . insulin aspart (novoLOG) injection 0-15 Units  0-15 Units Subcutaneous TID WC Encarnacion Slates, NP   5 Units at 11/25/12 1153  . insulin aspart (novoLOG) injection 0-5 Units  0-5 Units Subcutaneous QHS Encarnacion Slates, NP   2 Units at 11/23/12 2240  . LORazepam (ATIVAN) tablet 1 mg  1 mg Oral BID Encarnacion Slates, NP   1 mg at 11/25/12 1142  . [START ON 11/26/2012] LORazepam (ATIVAN) tablet 1 mg  1 mg Oral Once Encarnacion Slates, NP      . LORazepam (ATIVAN) tablet 1 mg  1 mg Oral Q6H PRN Nicholaus Bloom, MD   1 mg at 11/25/12 0335  . magnesium hydroxide (MILK OF MAGNESIA) suspension 30 mL  30 mL Oral Daily PRN Laverle Hobby, PA-C      . metFORMIN (GLUCOPHAGE-XR) 24 hr tablet 750  mg  750 mg Oral BID WC Encarnacion Slates, NP   750 mg at 11/25/12 1142  . multivitamin with minerals tablet 1 tablet  1 tablet Oral Daily Laverle Hobby, PA-C   1 tablet at 11/25/12 1142  . thiamine (B-1) injection 100 mg  100 mg Intramuscular Once [employer], PA-C      . thiamine (VITAMIN B-1) tablet 100 mg  100 mg Oral Daily Laverle Hobby, PA-C   100 mg at 11/25/12 1142  . traZODone (DESYREL) tablet 50 mg  50 mg Oral QHS,MR X 1 Laverle Hobby, PA-C   50 mg at 11/24/12 2222    Lab Results:  Results for orders placed during the hospital encounter of 11/22/12 (from the past 48 hour(s))  GLUCOSE, CAPILLARY     Status: Abnormal   Collection Time    11/23/12  5:21 PM      Result Value Range    Glucose-Capillary 146 (*) 70 - 99 mg/dL   Comment 1 Notify RN     Comment 2 Documented in Chart    GLUCOSE, CAPILLARY     Status: Abnormal   Collection Time    11/23/12  9:55 PM      Result Value Range   Glucose-Capillary 225 (*) 70 - 99 mg/dL   Comment 1 Notify RN     Comment 2 Documented in Chart    GLUCOSE, CAPILLARY     Status: Abnormal   Collection Time    11/24/12  5:54 AM      Result Value Range   Glucose-Capillary 183 (*) 70 - 99 mg/dL   Comment 1 Notify RN    GLUCOSE, CAPILLARY     Status: Abnormal   Collection Time    11/24/12 12:10 PM      Result Value Range   Glucose-Capillary 216 (*) 70 - 99 mg/dL   Comment 1 Documented in Chart    GLUCOSE, CAPILLARY     Status: Abnormal   Collection Time    11/24/12  5:17 PM      Result Value Range   Glucose-Capillary 320 (*) 70 - 99 mg/dL  GLUCOSE, CAPILLARY     Status: Abnormal   Collection Time    11/24/12  9:25 PM      Result Value Range   Glucose-Capillary 154 (*) 70 - 99 mg/dL  GLUCOSE, CAPILLARY     Status: Abnormal   Collection Time    11/25/12  7:06 AM      Result Value Range   Glucose-Capillary 212 (*) 70 - 99 mg/dL  GLUCOSE, CAPILLARY     Status: Abnormal   Collection Time    11/25/12 11:45 AM      Result Value Range   Glucose-Capillary 214 (*) 70 - 99 mg/dL    Physical Findings: AIMS: Facial and Oral Movements Muscles of Facial Expression: None, normal Lips and Perioral Area: None, normal Jaw: None, normal Tongue: None, normal,Extremity Movements Upper (arms, wrists, hands, fingers): None, normal Lower (legs, knees, ankles, toes): None, normal, Trunk Movements Neck, shoulders, hips: None, normal, Overall Severity Severity of abnormal movements (highest score from questions above): None, normal Incapacitation due to abnormal movements: None, normal Patient's awareness of abnormal movements (rate only patient's report): No Awareness, Dental Status Current problems with teeth and/or dentures?: No Does  patient usually wear dentures?: No  CIWA:  CIWA-Ar Total: 5 COWS:  COWS Total Score: 1  Treatment Plan Summary: Daily contact with patient to assess and evaluate symptoms  and progress in treatment Medication management  Plan: Continue and complete the detox protocol           Continue to work on relapse prevention  Medical Decision Making Problem Points:  Review of psycho-social stressors (1) Data Points:  Review of medication regiment & side effects (2)  I certify that inpatient services furnished can reasonably be expected to improve the patient's condition.   Telena Peyser A 11/25/2012, 2:29 PM

## 2012-11-25 NOTE — BHH Group Notes (Signed)
[facility] Group Notes:  (Nursing/MHT/Case Management/Adjunct)  Date:  11/25/2012  Time:  4:21 PM  Type of Therapy: Psychoeducational Skills  Participation Level: Did Not Attend  Participation Quality: none  Affect: none  Cognitive: none  Insight: None  Engagement in Group: none  Modes of Intervention: Problem-solving  Summary of Progress/Problems: Pt did not attend group.   Benancio Deeds Shanta 11/25/2012, 4:21 PM

## 2012-11-25 NOTE — BHH Group Notes (Signed)
[facility] Group Notes:  (Nursing/MHT/Case Management/Adjunct)  Date:  11/25/2012  Time:  4:03 PM  Type of Therapy:  Psychoeducational Skills  Participation Level:  Did Not Attend  Participation Quality:  na  Affect:  na  Cognitive:  na  Insight:  None  Engagement in Group:  na  Modes of Intervention:  na  Summary of Progress/Problems:  Leonia Reader 11/25/2012, 4:03 PM

## 2012-11-26 LAB — GLUCOSE, CAPILLARY
Glucose-Capillary: 218 mg/dL — ABNORMAL HIGH (ref 70–99)
Glucose-Capillary: 234 mg/dL — ABNORMAL HIGH (ref 70–99)

## 2012-11-26 NOTE — Progress Notes (Signed)
D. Pt has been up and has been active in milieu this evening and has participated in various milieu activities. Pt has endorsed having some anxiety and feels that this withdrawal has been harder than in the past. Pt has displayed some signs of withdrawal this evening and has endorsed feelings of depression but has denied any SI/HI A. Medication education given, support and encouragement provided. R. Pt verbalized understanding, will continue to monitor.

## 2012-11-26 NOTE — Progress Notes (Signed)
[facility] MD Progress Note  11/26/2012 1:52 PM [name]  MRN:  [id number] Subjective:  States he is still having a hard time. He feels this time around i has been worst. That is why he wants to make sure he does everything he can do in order not to relapse. He is wanting to go to a half way house. States the has not felt the energy, the strength to make the phone calls. He is going to try to push himself today. Diagnosis:  Alcohol Dependence/withdrawal  ADL's:  Intact  Sleep: Fair  Appetite:  Poor  Suicidal Ideation:  Plan:  denies Intent:  denies Means:  denies Homicidal Ideation:  Plan:  denies Intent:  denies Means:  denies AEB (as evidenced by):  Psychiatric Specialty Exam: Review of Systems  HENT: Negative.   Eyes: Negative.   Respiratory: Negative.   Cardiovascular: Negative.   Gastrointestinal: Negative.   Genitourinary: Negative.   Musculoskeletal: Negative.   Skin: Negative.   Neurological: Positive for dizziness, tremors and weakness.  Endo/Heme/Allergies: Negative.   Psychiatric/Behavioral: Positive for substance abuse. The patient is nervous/anxious.     Blood pressure 153/91, pulse 82, temperature 97.9 F (36.6 C), temperature source Oral, resp. rate 18.There is no weight on file to calculate BMI.  General Appearance: Disheveled  Eye Sport and exercise psychologist::  Fair  Speech:  Clear and Coherent and Slow  Volume:  Decreased  Mood:  Anxious and worried  Affect:  anxious, worried  Thought Process:  Coherent and Goal Directed  Orientation:  Full (Time, Place, and Person)  Thought Content:  worries, concerns, fear of relapsing  Suicidal Thoughts:  No  Homicidal Thoughts:  No  Memory:  Immediate;   Fair Recent;   Fair Remote;   Fair  Judgement:  Fair  Insight:  Present  Psychomotor Activity:  Restlessness  Concentration:  Fair  Recall:  Fair  Akathisia:  No  Handed:  Right  AIMS (if indicated):     Assets:  Desire for Improvement  Sleep:  Number of Hours: 4.5    Current Medications: Current Facility-Administered Medications  Medication Dose Route Frequency Provider Last Rate Last Dose  . acetaminophen (TYLENOL) tablet 650 mg  650 mg Oral Q6H PRN Laverle Hobby, PA-C   650 mg at 11/24/12 0753  . feeding supplement (ENSURE COMPLETE) liquid 237 mL  237 mL Oral BID BM Dagmar Hait, RD   237 mL at 11/26/12 1144  . ibuprofen (ADVIL,MOTRIN) tablet 600 mg  600 mg Oral Q6H PRN Laverle Hobby, PA-C   600 mg at 11/26/12 1047  . insulin aspart (novoLOG) injection 0-15 Units  0-15 Units Subcutaneous TID WC Encarnacion Slates, NP   5 Units at 11/26/12 1143  . insulin aspart (novoLOG) injection 0-5 Units  0-5 Units Subcutaneous QHS Encarnacion Slates, NP   3 Units at 11/25/12 2140  . LORazepam (ATIVAN) tablet 1 mg  1 mg Oral Q6H PRN Nicholaus Bloom, MD   1 mg at 11/26/12 0354  . magnesium hydroxide (MILK OF MAGNESIA) suspension 30 mL  30 mL Oral Daily PRN Laverle Hobby, PA-C      . metFORMIN (GLUCOPHAGE-XR) 24 hr tablet 750 mg  750 mg Oral BID WC Encarnacion Slates, NP   750 mg at 11/26/12 1047  . multivitamin with minerals tablet 1 tablet  1 tablet Oral Daily Laverle Hobby, PA-C   1 tablet at 11/26/12 1047  . thiamine (B-1) injection 100 mg  100 mg Intramuscular  Once [employer], PA-C      . thiamine (VITAMIN B-1) tablet 100 mg  100 mg Oral Daily Laverle Hobby, PA-C   100 mg at 11/26/12 1047  . traZODone (DESYREL) tablet 50 mg  50 mg Oral QHS,MR X 1 Laverle Hobby, PA-C   50 mg at 11/25/12 2234    Lab Results:  Results for orders placed during the hospital encounter of 11/22/12 (from the past 48 hour(s))  GLUCOSE, CAPILLARY     Status: Abnormal   Collection Time    11/24/12  5:17 PM      Result Value Range   Glucose-Capillary 320 (*) 70 - 99 mg/dL  GLUCOSE, CAPILLARY     Status: Abnormal   Collection Time    11/24/12  9:25 PM      Result Value Range   Glucose-Capillary 154 (*) 70 - 99 mg/dL  GLUCOSE, CAPILLARY     Status: Abnormal   Collection Time     11/25/12  7:06 AM      Result Value Range   Glucose-Capillary 212 (*) 70 - 99 mg/dL  GLUCOSE, CAPILLARY     Status: Abnormal   Collection Time    11/25/12 11:45 AM      Result Value Range   Glucose-Capillary 214 (*) 70 - 99 mg/dL  GLUCOSE, CAPILLARY     Status: Abnormal   Collection Time    11/25/12  5:13 PM      Result Value Range   Glucose-Capillary 180 (*) 70 - 99 mg/dL  GLUCOSE, CAPILLARY     Status: Abnormal   Collection Time    11/25/12  9:26 PM      Result Value Range   Glucose-Capillary 264 (*) 70 - 99 mg/dL   Comment 1 Notify RN     Comment 2 Documented in Chart    GLUCOSE, CAPILLARY     Status: Abnormal   Collection Time    11/26/12  6:18 AM      Result Value Range   Glucose-Capillary 219 (*) 70 - 99 mg/dL  GLUCOSE, CAPILLARY     Status: Abnormal   Collection Time    11/26/12 11:38 AM      Result Value Range   Glucose-Capillary 230 (*) 70 - 99 mg/dL   Comment 1 Notify RN      Physical Findings: AIMS: Facial and Oral Movements Muscles of Facial Expression: None, normal Lips and Perioral Area: None, normal Jaw: None, normal Tongue: None, normal,Extremity Movements Upper (arms, wrists, hands, fingers): None, normal Lower (legs, knees, ankles, toes): None, normal, Trunk Movements Neck, shoulders, hips: None, normal, Overall Severity Severity of abnormal movements (highest score from questions above): None, normal Incapacitation due to abnormal movements: None, normal Patient's awareness of abnormal movements (rate only patient's report): No Awareness, Dental Status Current problems with teeth and/or dentures?: No Does patient usually wear dentures?: No  CIWA:  CIWA-Ar Total: 0 COWS:  COWS Total Score: 1  Treatment Plan Summary: Daily contact with patient to assess and evaluate symptoms and progress in treatment Medication management  Plan: Supportive approach/coping skills/relapse prevention           Complete the detox           Facilitate communication  with [facility] Decision Making Problem Points:  Review of psycho-social stressors (1) Data Points:  Review of medication regiment & side effects (2) Review of new medications or change in dosage (2)  I certify that inpatient services furnished  can reasonably be expected to improve the patient's condition.   Vernadette Stutsman A 11/26/2012, 1:52 PM

## 2012-11-26 NOTE — Progress Notes (Signed)
Patient ID: [name], male   DOB: 06-30-1955, 57 y.o.   MRN: [id number]  D: Patient has been very flat and depressed on the unit. Pt reported that he is very depressed and very hopeless/helpless. Pt reported that he was negative SI/HI, no AH/VH noted. Pt also did not attend any groups and did not engage in treatment. A: 15 min checks continued for patient safety. R: Pt safety maintained.

## 2012-11-26 NOTE — BHH Group Notes (Signed)
[facility] Group Notes:  (Nursing/MHT/Case Management/Adjunct)   Date:  11/26/2012  Time:  10:10 AM  Type of Therapy:  Psychoeducational Skills  Participation Level:  Did Not Attend  Participation Quality:  did not attend   Affect:  did not attend  Cognitive:  did not attend  Insight:  None  Engagement in Group:  did not attend  Modes of Intervention:  did not attend  Summary of Progress/Problems:  Did not attend group despite encouragement from staff.  Leonia Reader 11/26/2012, 10:13 AM

## 2012-11-26 NOTE — Progress Notes (Signed)
Adult Psychoeducational Group Note  Date:  11/26/2012 Time:  6:58 PM  Group Topic/Focus:  Perspective: Through this group patients will identify negative irrational thoughts about themselves and how looking at things in a different perspective can help to challenge those thoughts. Patients will participate in an activity identifying two images in one picture. They will discuss what the members saw in the pictures and the importance of having different perspectives.  Participation Level:  Did Not Attend   Joneen Caraway 11/26/2012, 6:58 PM

## 2012-11-26 NOTE — BHH Group Notes (Signed)
[facility] Group Notes: (Clinical Social Work)   11/26/2012      Type of Therapy:  Group Therapy   Participation Level:  Did Not Attend    Selmer Dominion, LCSW 11/26/2012, 12:05 PM

## 2012-11-27 LAB — GLUCOSE, CAPILLARY
Glucose-Capillary: 153 mg/dL — ABNORMAL HIGH (ref 70–99)
Glucose-Capillary: 220 mg/dL — ABNORMAL HIGH (ref 70–99)
Glucose-Capillary: 208 mg/dL — ABNORMAL HIGH (ref 70–99)

## 2012-11-27 MED ORDER — NICOTINE POLACRILEX 2 MG MT GUM
2.0000 mg | CHEWING_GUM | OROMUCOSAL | Status: DC | PRN
  Administered 2012-11-27 (×2): 2 mg via ORAL

## 2012-11-27 NOTE — Progress Notes (Signed)
Pt has been in bed most of the day.  He denied any depression or hopelessness on his self-inventory.  He did rate his anxiety a 5.  He denied any S/H ideation A/V hallucinations.  He did c/o dizziness so we got out his fall sheet from his packet and went over the fall paper again.  He voiced understanding.  No new orders and only prn ativan at 1223 which he reported decreased.  He is unsure of a plan but he hoping to get into an [employer].

## 2012-11-27 NOTE — Tx Team (Signed)
Interdisciplinary Treatment Plan Update (Adult)  Date: 11/27/2012   Time Reviewed: 9:56 AM  Progress in Treatment:  Attending groups: Intermittently  Participating in groups:  Yes. When he attends.  Taking medication as prescribed: Yes  Tolerating medication: Yes  Family/Significant othe contact made: No. SPE not required for this pt.  Patient understands diagnosis: Yes, AEB seeking treatment for ETOH detox.  Discussing patient identified problems/goals with staff: Yes  Medical problems stabilized or resolved: Yes  Denies suicidal/homicidal ideation: Yes, during admission and self report.  Patient has not harmed self or Others: Yes  New problem(s) identified:   Discharge Plan or Barriers: Pt is not attending d/c planning group. He is interested in [facility] placement and will likely follow up at [facility] for med management. Pt refusing referral to therapist and plans to attend daily AA meetings. Pt provided with [facility] list.  Additional comments: Reason for Continuation of Hospitalization: Mood stabilization Medication management Estimated length of stay: 1 day For review of initial/current patient goals, please see plan of care.  Attendees:  Patient:    Family:    Physician: Carlton Adam MD 11/27/2012 9:56 AM   Nursing: Adonis Huguenin RN 11/27/2012 9:58 AM   Clinical Social Worker [place], [place]  11/27/2012 9:56 AM   Other: Seth Bake RN  11/27/2012 9:56 AM   Other:  Aggie PA 11/27/2012 9:56 AM   Other: Gerline Legacy Nurse RN 11/27/2012 9:56 AM   Other: Delora TCC 11/27/2012 9:57 AM   Scribe for Treatment Team:  Maxie Better LCSWA 11/27/2012 9:56 AM

## 2012-11-27 NOTE — Progress Notes (Signed)
Patient ID: [name], male   DOB: 18-Apr-1956, 57 y.o.   MRN: [id number] D: Pt. Visible on the unit, asks for Anxiety immediately upon writers shift. "I'm detoxing it usually doesn't take this long, its been six days". A: writer reviewed meds and administered Ativan. Staff will monitor q25mn for safety. Staff encouraged group. R: Pt. Is safe on the unit. Pt. Attended group.

## 2012-11-27 NOTE — Progress Notes (Signed)
Adult Psychoeducational Group Note  Date:  11/27/2012 Time:  1:08 PM  Group Topic/Focus:  Wellness Toolbox:   The focus of this group is to discuss various aspects of wellness, balancing those aspects and exploring ways to increase the ability to experience wellness.  Patients will create a wellness toolbox for use upon discharge.  Participation Level:  Did Not Attend  Participation Quality:  Did not attend  Affect:  did not attend  Cognitive:  did not attend  Insight: None  Engagement in Group:  did not attend  Modes of Intervention:  Discussion, Education and Support  Additional Comments:  [name] did not attend group.   Dene Gentry 11/27/2012, 1:08 PM

## 2012-11-27 NOTE — BHH Group Notes (Signed)
[facility] LCSW Group Therapy  11/27/2012 4:45 PM  Type of Therapy:  Group Therapy  Participation Level:  Did Not Attend   Smart, Nira Conn 11/27/2012, 4:45 PM

## 2012-11-27 NOTE — Progress Notes (Signed)
Pt. Is aq diabetic and his CBG was 234 this pm.  Pt. Received 2 units of novolog.  Denies using the insulin at home but reports he has been drinking and not eating.  Pt. Is getting glucerna 3 times a day but states he does not have an appetite yet.  Pt. Attended wrap up group this pm. Pt. Denies SI and HI.

## 2012-11-27 NOTE — BHH Group Notes (Signed)
[facility] LCSW Aftercare Discharge Planning Group Note   11/27/2012 9:14 AM  Participation Quality:  Appropriate   Mood/Affect:  Excited  Depression Rating:  None/0  Anxiety Rating:  8-9  Thoughts of Suicide:  No Will you contract for safety?   NA  Current AVH:  No  Plan for Discharge/Comments:  Pt requesting numbers to [facility]. List given to pt by CSW. Pt planning to followup at [facility] and is refusing therapy referral. Pt stated that AA/NA is better for him and he plans to attend groups daily after d/c. Pt requesting 3 letters (copies-To whom it may concern) at d/c with dates of when he was in hospital.   Transportation Means: bus/unknown  Supports: none identified by pt.   Smart, [employer]

## 2012-11-27 NOTE — Progress Notes (Signed)
Patient ID: [name], male   DOB: 1955/08/29, 57 y.o.   MRN: [id number] [facility] MD Progress Note  11/27/2012 2:10 PM SAMAAD HASHEM  MRN:  [id number]  Subjective:  [name] reports that he still having a lot of anxiety. He says it is because he realized it almost time to be discharged. He is almost thru detoxification treatment except for minor tremors and anxiety. He says that he is now starting to figure out something that will keep him from being on the streets. He is hoping to get into the [facility] to gain at least 4 extra weeks of rehab. Then he will be ready to go back to his apartment. He currently denies any SIHI, AVH.   Diagnosis:  Alcohol Dependence/withdrawal  ADL's:  Intact  Sleep: Fair  Appetite:  Poor  Suicidal Ideation:  Plan:  denies Intent:  denies Means:  denies Homicidal Ideation:  Plan:  denies Intent:  denies Means:  denies AEB (as evidenced by):  Psychiatric Specialty Exam: Review of Systems  HENT: Negative.   Eyes: Negative.   Respiratory: Negative.   Cardiovascular: Negative.   Gastrointestinal: Negative.   Genitourinary: Negative.   Musculoskeletal: Negative.   Skin: Negative.   Neurological: Positive for dizziness, tremors and weakness.  Endo/Heme/Allergies: Negative.   Psychiatric/Behavioral: Positive for substance abuse. The patient is nervous/anxious.     Blood pressure 69/41, pulse 109, temperature 97.6 F (36.4 C), temperature source Oral, resp. rate 18.There is no weight on file to calculate BMI.  General Appearance: Disheveled  Eye Sport and exercise psychologist::  Fair  Speech:  Clear and Coherent and Slow  Volume:  Decreased  Mood:  Anxious and worried  Affect:  anxious, worried  Thought Process:  Coherent and Goal Directed  Orientation:  Full (Time, Place, and Person)  Thought Content:  worries, concerns, fear of relapsing  Suicidal Thoughts:  No  Homicidal Thoughts:  No  Memory:  Immediate;   Fair Recent;   Fair Remote;   Fair  Judgement:   Fair  Insight:  Present  Psychomotor Activity:  Restlessness  Concentration:  Fair  Recall:  Fair  Akathisia:  No  Handed:  Right  AIMS (if indicated):     Assets:  Desire for Improvement  Sleep:  Number of Hours: 4.25   Current Medications: Current Facility-Administered Medications  Medication Dose Route Frequency Provider Last Rate Last Dose  . acetaminophen (TYLENOL) tablet 650 mg  650 mg Oral Q6H PRN Laverle Hobby, PA-C   650 mg at 11/24/12 0753  . feeding supplement (ENSURE COMPLETE) liquid 237 mL  237 mL Oral BID BM Dagmar Hait, RD   237 mL at 11/26/12 1144  . ibuprofen (ADVIL,MOTRIN) tablet 600 mg  600 mg Oral Q6H PRN Laverle Hobby, PA-C   600 mg at 11/26/12 1047  . insulin aspart (novoLOG) injection 0-15 Units  0-15 Units Subcutaneous TID WC Encarnacion Slates, NP   5 Units at 11/27/12 1217  . insulin aspart (novoLOG) injection 0-5 Units  0-5 Units Subcutaneous QHS Encarnacion Slates, NP   2 Units at 11/26/12 2212  . LORazepam (ATIVAN) tablet 1 mg  1 mg Oral Q6H PRN Nicholaus Bloom, MD   1 mg at 11/27/12 1223  . magnesium hydroxide (MILK OF MAGNESIA) suspension 30 mL  30 mL Oral Daily PRN Laverle Hobby, PA-C      . metFORMIN (GLUCOPHAGE-XR) 24 hr tablet 750 mg  750 mg Oral BID WC Encarnacion Slates, NP  750 mg at 11/27/12 0842  . multivitamin with minerals tablet 1 tablet  1 tablet Oral Daily Laverle Hobby, PA-C   1 tablet at 11/27/12 [phone]  . nicotine polacrilex (NICORETTE) gum 2 mg  2 mg Oral PRN Nicholaus Bloom, MD   2 mg at 11/27/12 0604  . thiamine (B-1) injection 100 mg  100 mg Intramuscular Once [employer], PA-C      . thiamine (VITAMIN B-1) tablet 100 mg  100 mg Oral Daily Laverle Hobby, PA-C   100 mg at 11/27/12 0841  . traZODone (DESYREL) tablet 50 mg  50 mg Oral QHS,MR X 1 Laverle Hobby, PA-C   50 mg at 11/26/12 2302    Lab Results:  Results for orders placed during the hospital encounter of 11/22/12 (from the past 48 hour(s))  GLUCOSE, CAPILLARY     Status:  Abnormal   Collection Time    11/25/12  5:13 PM      Result Value Range   Glucose-Capillary 180 (*) 70 - 99 mg/dL  GLUCOSE, CAPILLARY     Status: Abnormal   Collection Time    11/25/12  9:26 PM      Result Value Range   Glucose-Capillary 264 (*) 70 - 99 mg/dL   Comment 1 Notify RN     Comment 2 Documented in Chart    GLUCOSE, CAPILLARY     Status: Abnormal   Collection Time    11/26/12  6:18 AM      Result Value Range   Glucose-Capillary 219 (*) 70 - 99 mg/dL  GLUCOSE, CAPILLARY     Status: Abnormal   Collection Time    11/26/12 11:38 AM      Result Value Range   Glucose-Capillary 230 (*) 70 - 99 mg/dL   Comment 1 Notify RN    GLUCOSE, CAPILLARY     Status: Abnormal   Collection Time    11/26/12  5:18 PM      Result Value Range   Glucose-Capillary 218 (*) 70 - 99 mg/dL  GLUCOSE, CAPILLARY     Status: Abnormal   Collection Time    11/26/12  9:18 PM      Result Value Range   Glucose-Capillary 234 (*) 70 - 99 mg/dL   Comment 1 Notify RN     Comment 2 Documented in Chart    GLUCOSE, CAPILLARY     Status: Abnormal   Collection Time    11/27/12  5:59 AM      Result Value Range   Glucose-Capillary 153 (*) 70 - 99 mg/dL  GLUCOSE, CAPILLARY     Status: Abnormal   Collection Time    11/27/12 11:59 AM      Result Value Range   Glucose-Capillary 220 (*) 70 - 99 mg/dL    Physical Findings: AIMS: Facial and Oral Movements Muscles of Facial Expression: None, normal Lips and Perioral Area: None, normal Jaw: None, normal Tongue: None, normal,Extremity Movements Upper (arms, wrists, hands, fingers): None, normal Lower (legs, knees, ankles, toes): None, normal, Trunk Movements Neck, shoulders, hips: None, normal, Overall Severity Severity of abnormal movements (highest score from questions above): None, normal Incapacitation due to abnormal movements: None, normal Patient's awareness of abnormal movements (rate only patient's report): No Awareness, Dental Status Current  problems with teeth and/or dentures?: No Does patient usually wear dentures?: No  CIWA:  CIWA-Ar Total: 4 COWS:  COWS Total Score: 1  Treatment Plan Summary: Daily contact with patient to assess and  evaluate symptoms and progress in treatment Medication management  Plan: Supportive approach/coping skills/relapse prevention. Encouraged out of room, participation in group sessions and application of coping skills when distressed. Will continue to monitor response to/adverse effects of medications in use to assure effectiveness. Continue to monitor mood, behavior and interaction with staff and other patients. Discharge plan in progress. Continue current plan of care.  Medical Decision Making Problem Points:  Review of psycho-social stressors (1) Data Points:  Review of medication regiment & side effects (2) Review of new medications or change in dosage (2)  I certify that inpatient services furnished can reasonably be expected to improve the patient's condition.   Lindell Spar I, PMHNP-BC, FNP 11/27/2012, 2:10 PM

## 2012-11-28 LAB — GLUCOSE, CAPILLARY: Glucose-Capillary: 210 mg/dL — ABNORMAL HIGH (ref 70–99)

## 2012-11-28 MED ORDER — TRAZODONE HCL 50 MG PO TABS: 50.0000 mg | ORAL_TABLET | Freq: Every evening | ORAL | Status: DC | PRN

## 2012-11-28 MED ORDER — METFORMIN HCL ER 750 MG PO TB24: 750.0000 mg | ORAL_TABLET | Freq: Two times a day (BID) | ORAL | Status: DC

## 2012-11-28 NOTE — Progress Notes (Signed)
[facility] Adult Case Management Discharge Plan :  Will you be returning to the same living situation after discharge: Yes,  home to apt. At discharge, do you have transportation home?:Yes,  bu pass in chart Do you have the ability to pay for your medications:Yes,  mental health  Release of information consent forms completed and in the chart;  Patient's signature needed at discharge.  Patient to Follow up at: Follow-up Information   Follow up with [facility]. (Walk in between 8AM-9AM Monday through Friday for hospital followup/medication management. )    Contact information:   [address], [place] [address] phone: [phone] fax: [phone]        Patient denies SI/HI:   Yes,  during admission/self report/in group    Safety Planning and Suicide Prevention discussed:  Yes,  spe not required for this pt. he did not endorse SI during admission or during stay at [facility]. SPI pamphlet provided to pt and pt given information for Mobile Crisis.   [name], [name] 11/28/2012, 9:54 AM

## 2012-11-28 NOTE — [facility] Suicide Risk Assessment (Signed)
Suicide Risk Assessment  Discharge Assessment     Demographic Factors:  Male and Caucasian  Mental Status Per Nursing Assessment::   On Admission:     Current Mental Status by Physician: In full contact with reality. There are no suicidal ideas, plans or intent. His mood is euthymic. His affect is appropiate. He feels he is ready to go. He has lined up a half way house that is going to provide the structure that he needs. He admits that this time around has been very rough for what he is even more committed not to relapse   Loss Factors: Decrease in vocational status and Decline in physical health  Historical Factors: NA  Risk Reduction Factors:   Living with another person, especially a relative  Continued Clinical Symptoms:  Alcohol/Substance Abuse/Dependencies  Cognitive Features That Contribute To Risk: No evidence   Suicide Risk:  Minimal: No identifiable suicidal ideation.  Patients presenting with no risk factors but with morbid ruminations; may be classified as minimal risk based on the severity of the depressive symptoms  Discharge Diagnoses:   AXIS I:  Alcohol Dependence/S/P alcohol withdrawal AXIS II:  Deferred AXIS III:   Past Medical History  Diagnosis Date  . Hepatitis C   . Diabetes mellitus   . Chronic hyponatremia     pt denies having   . Alcoholic   . Alcoholic cirrhosis of liver     Pt calls this a false diagnosis. Pt denies  . Thrombocytopenia   . Hyperglycemia    AXIS IV:  other psychosocial or environmental problems AXIS V:  61-70 mild symptoms  Plan Of Care/Follow-up recommendations:  Activity:  as tolerated Diet:  regular Follow up outpatient basis/[facility] Is patient on multiple antipsychotic therapies at discharge:  No   Has Patient had three or more failed trials of antipsychotic monotherapy by history:  No  Recommended Plan for Multiple Antipsychotic Therapies: N/A   Felcia Huebert A 11/28/2012, 12:37 PM

## 2012-11-28 NOTE — Progress Notes (Signed)
Adult Psychoeducational Group Note  Date:  11/28/2012 Time:  11:05 AM  Group Topic/Focus:  did not attend  Participation Level:  Did Not Attend  Participation Quality:  did not attend  Affect:  did not attend  Cognitive:  did not attend  Insight: None  Engagement in Group:  None  Modes of Intervention:  Activity  Additional Comments:  Normal did not attend this activity  Dene Gentry 11/28/2012, 11:05 AM

## 2012-11-28 NOTE — Discharge Summary (Signed)
Physician Discharge Summary Note  Patient:  [name] is an 57 y.o., male MRN:  [id number] DOB:  [birth year] Patient phone:  [phone] (home)  Patient address:   [place] [address],   Date of Admission:  11/22/2012 Date of Discharge: 11/28/12  Reason for Admission: Alcohol detox  Discharge Diagnoses: Active Problems:   * No active hospital problems. *  Review of Systems  Constitutional: Negative.   HENT: Negative.   Eyes: Negative.   Respiratory: Negative.   Cardiovascular: Negative.   Gastrointestinal: Negative.   Genitourinary: Negative.   Musculoskeletal: Negative.   Skin: Negative.   Neurological: Negative.   Endo/Heme/Allergies: Negative.   Psychiatric/Behavioral: Positive for substance abuse (Alcoholism). Negative for depression, suicidal ideas, hallucinations and memory loss. The patient is nervous/anxious (Stabilized with medication) and has insomnia (Stabilized with medication).    Axis Diagnosis:   AXIS I:  Alcohol dependence AXIS II:  Deferred AXIS III:   Past Medical History  Diagnosis Date  . Hepatitis C   . Diabetes mellitus   . Chronic hyponatremia     pt denies having   . Alcoholic   . Alcoholic cirrhosis of liver     Pt calls this a false diagnosis. Pt denies  . Thrombocytopenia   . Hyperglycemia    AXIS IV:  Alcoholism AXIS V:  56  Level of Care:  OP  Hospital Course: This is a 57 year old Caucasian male. Admitted to [facility] from the [facility] ED with complaints of alcohol intoxication requesting detox. Patient reports, "The ambulance took me to the hospital 2 days ago. I called the ambulance myself because I know that I have to do something about my alcoholism. I just made a decision to stop this alcoholism, or it will kill. My liver is done messed up. I have been drinking heavily x 4 months. I have gone to a lot of treatment places, after I get out, I will be back to the same old stuff in no time. I was at [facility]  in [place], [place] 1 month ago. I relapsed 2 days after discharge from [facility]. Drinking alcohol makes me feel normal. If I did drink alcohol, I stagger around, falling like no body has. My longest sobriety was 6 years. I relapsed after I got divorced and get to be feeling bad. Right now, I feel cold, warm, chills, sweats, stomach cramps and diarrhea".   Upon admission in this hospital, and after admission assessment/evaluation coupled with UDS/Toxicology reports, it was determined that [name] will need detoxification treatment protocol to stabilize her system of drug intoxication and to combat the withdrawal symptoms of these substances as well.  [name] was then started on Librium treatment protocol. She was also enrolled in group counseling sessions and activities where she was counseled and learned coping skills that should help him after discharge to cope better and manage his substance abuse issues to sustain a much longer sobriety. He also attended AA/NA meetings being offered and held on this unit. [name] has some previously existing and or identifiable medical conditions that required treatment and or monitoring. He received medication management for all those health issues as well. He was monitored closely for any potential problems that may arise as a result of and or during detoxification treatment. Patient tolerated his treatment regimen and detoxification treatment protocol without any significant adverse effects and or reactions presented.  Besides the treatments received here and scheduled outpatient psychiatric services , patient was ordered  and  encouraged to join/attend AA/NA meetings being offered and held within her community. Patient attended treatment team meeting this am and met with the treatment team members. His reason for admission, present symptoms, substance abuse issues, response to treatment and discharge plans discussed. Patient endorsed that he is doing well and stable  for discharge to pursue the next phase of his substance abuse treatment. It was then agreed upon that he will follow-up care at the [facility] psychiatric clinic here in [place], [place]. Patient has been instructed that this is a walk-in appointment between the hours of 08:00 and 09:00 am. The address, date, time and contact information for this clinic provided for patient in writing.  Upon discharge, patient adamantly denies suicidal, homicidal ideations, auditory, visual hallucinations, delusional thougts and or withdrawal symptoms. Patient left [facility] with all personal belongings in no apparent distress. He received 2 weeks worth supply samples of his discharge medications provided by [facility] pharmacy. Transportation per city bus. Bus pass provided by [facility].   Consults:  psychiatry  Significant Diagnostic Studies:  labs: CBC with diff, CMP, UDS, Toxicology, U/A  Discharge Vitals:   Blood pressure 84/58, pulse 101, temperature 97 F (36.1 C), temperature source Oral, resp. rate 16. There is no weight on file to calculate BMI. Lab Results:   Results for orders placed during the hospital encounter of 11/22/12 (from the past 72 hour(s))  GLUCOSE, CAPILLARY     Status: Abnormal   Collection Time    11/25/12  5:13 PM      Result Value Range   Glucose-Capillary 180 (*) 70 - 99 mg/dL  GLUCOSE, CAPILLARY     Status: Abnormal   Collection Time    11/25/12  9:26 PM      Result Value Range   Glucose-Capillary 264 (*) 70 - 99 mg/dL   Comment 1 Notify RN     Comment 2 Documented in Chart    GLUCOSE, CAPILLARY     Status: Abnormal   Collection Time    11/26/12  6:18 AM      Result Value Range   Glucose-Capillary 219 (*) 70 - 99 mg/dL  GLUCOSE, CAPILLARY     Status: Abnormal   Collection Time    11/26/12 11:38 AM      Result Value Range   Glucose-Capillary 230 (*) 70 - 99 mg/dL   Comment 1 Notify RN    GLUCOSE, CAPILLARY     Status: Abnormal   Collection Time    11/26/12  5:18 PM      Result Value  Range   Glucose-Capillary 218 (*) 70 - 99 mg/dL  GLUCOSE, CAPILLARY     Status: Abnormal   Collection Time    11/26/12  9:18 PM      Result Value Range   Glucose-Capillary 234 (*) 70 - 99 mg/dL   Comment 1 Notify RN     Comment 2 Documented in Chart    GLUCOSE, CAPILLARY     Status: Abnormal   Collection Time    11/27/12  5:59 AM      Result Value Range   Glucose-Capillary 153 (*) 70 - 99 mg/dL  GLUCOSE, CAPILLARY     Status: Abnormal   Collection Time    11/27/12 11:59 AM      Result Value Range   Glucose-Capillary 220 (*) 70 - 99 mg/dL  GLUCOSE, CAPILLARY     Status: Abnormal   Collection Time    11/27/12  4:59 PM  Result Value Range   Glucose-Capillary 161 (*) 70 - 99 mg/dL  GLUCOSE, CAPILLARY     Status: Abnormal   Collection Time    11/27/12  9:12 PM      Result Value Range   Glucose-Capillary 208 (*) 70 - 99 mg/dL  GLUCOSE, CAPILLARY     Status: Abnormal   Collection Time    11/28/12  6:39 AM      Result Value Range   Glucose-Capillary 192 (*) 70 - 99 mg/dL  GLUCOSE, CAPILLARY     Status: Abnormal   Collection Time    11/28/12 12:09 PM      Result Value Range   Glucose-Capillary 210 (*) 70 - 99 mg/dL    Physical Findings: AIMS: Facial and Oral Movements Muscles of Facial Expression: None, normal Lips and Perioral Area: None, normal Jaw: None, normal Tongue: None, normal,Extremity Movements Upper (arms, wrists, hands, fingers): None, normal Lower (legs, knees, ankles, toes): None, normal, Trunk Movements Neck, shoulders, hips: None, normal, Overall Severity Severity of abnormal movements (highest score from questions above): None, normal Incapacitation due to abnormal movements: None, normal Patient's awareness of abnormal movements (rate only patient's report): No Awareness, Dental Status Current problems with teeth and/or dentures?: No Does patient usually wear dentures?: No  CIWA:  CIWA-Ar Total: 0 COWS:  COWS Total Score: 1  Psychiatric Specialty  Exam: See Psychiatric Specialty Exam and Suicide Risk Assessment completed by Attending Physician prior to discharge.  Discharge destination:  Home  Is patient on multiple antipsychotic therapies at discharge:  No   Has Patient had three or more failed trials of antipsychotic monotherapy by history:  No  Recommended Plan for Multiple Antipsychotic Therapies: NA     Medication List       Indication   metFORMIN 750 MG 24 hr tablet  Commonly known as:  GLUCOPHAGE-XR  Take 1 tablet (750 mg total) by mouth 2 (two) times daily with a meal. For diabetes control   Indication:  Type 2 Diabetes     traZODone 50 MG tablet  Commonly known as:  DESYREL  Take 1 tablet (50 mg total) by mouth at bedtime and may repeat dose one time if needed. For sleep   Indication:  Trouble Sleeping       Follow-up Information   Follow up with [facility]. (Walk in between 8AM-9AM Monday through Friday for hospital followup/medication management. )    Contact information:   [address],  [address] phone: [phone] fax: [phone]       Follow-up recommendations: Activity:  As tolerated Diet: As recommended by your primary care doctor. Keep all scheduled follow-up appointments as recommended. Pursue the relapse prevention plan Comments:  Take all your medications as prescribed by your mental healthcare provider. Report any adverse effects and or reactions from your medicines to your outpatient provider promptly. Patient is instructed and cautioned to not engage in alcohol and or illegal drug use while on prescription medicines. In the event of worsening symptoms, patient is instructed to call the crisis hotline, 911 and or go to the nearest ED for appropriate evaluation and treatment of symptoms. Follow-up with your primary care provider for your other medical issues, concerns and or health care needs.   Total Discharge Time:  Greater than 30 minutes.  Signed: Encarnacion Slates, FNP,  PMHNP-BC 11/28/2012, 3:46 PM Personally examined the patient, agree with assessment and plan Geralyn Flash A. [place], [place].D

## 2012-11-28 NOTE — Progress Notes (Signed)
Pt was discharged home today.  He denied any S/I H/I or A/V hallucinations.    He was given f/u appointment, rx, sample medications, hotline info booklet, and bus pass.  He voiced understanding to all instructions provided.  He declined the need for smoking cessation materials.

## 2012-11-29 NOTE — Consult Note (Signed)
Agree with assessment and plan Geralyn Flash A. [name], M.D.

## 2012-12-01 NOTE — Progress Notes (Signed)
Patient Discharge Instructions:  After Visit Summary (AVS):   Faxed to:  12/01/12 Discharge Summary Note:   Faxed to:  12/01/12 Psychiatric Admission Assessment Note:   Faxed to:  12/01/12 Suicide Risk Assessment - Discharge Assessment:   Faxed to:  12/01/12 Faxed/Sent to the Next Level Care provider:  12/01/12 Faxed to [facility] @ [place], 12/01/2012, 3:33 PM

## 2012-12-25 ENCOUNTER — Encounter (HOSPITAL_COMMUNITY): Payer: Self-pay | Admitting: Emergency Medicine

## 2012-12-25 ENCOUNTER — Emergency Department (INDEPENDENT_AMBULATORY_CARE_PROVIDER_SITE_OTHER)
Admission: EM | Admit: 2012-12-25 | Discharge: 2012-12-25 | Disposition: A | Discharge status: Home / Self Care | Payer: Self-pay | Source: Home / Self Care

## 2012-12-25 DIAGNOSIS — F1020 Alcohol dependence, uncomplicated: Secondary | ICD-10-CM

## 2012-12-25 DIAGNOSIS — E119 Type 2 diabetes mellitus without complications: Secondary | ICD-10-CM

## 2012-12-25 DIAGNOSIS — Z720 Tobacco use: Secondary | ICD-10-CM

## 2012-12-25 DIAGNOSIS — F17200 Nicotine dependence, unspecified, uncomplicated: Secondary | ICD-10-CM

## 2012-12-25 MED ORDER — METFORMIN HCL ER 750 MG PO TB24: ORAL_TABLET | ORAL | Status: DC

## 2012-12-25 NOTE — ED Provider Notes (Signed)
Medical screening examination/treatment/procedure(s) were performed by non-physician practitioner and as supervising physician I was immediately available for consultation/collaboration.  Philipp Deputy, M.D.  Harden Mo, MD 12/25/12 2016

## 2012-12-25 NOTE — ED Notes (Signed)
Reports out of metformin for 10 days. Pt also c/o frequent urination at night and not being able to rest well due to constantly having to go to the bathroom.

## 2012-12-25 NOTE — ED Provider Notes (Signed)
CSN: [id number]     Arrival date & time 12/25/12  1409 History     First MD Initiated Contact with Patient 12/25/12 1546     Chief Complaint  Patient presents with  . Medication Refill    pt is out of metformin. with out meds for 10 days.    (Consider location/radiation/quality/duration/timing/severity/associated sxs/prior Treatment) HPI Comments: 57 year old male with a history of alcoholism (out of alcohol rehabilitation for one month), tobacco addiction with 40-pack-year history, type 2 diabetes and noncompliance. He states he is here to get a prescription for his metformin. He has been out of medication for 10 days. He states he usually goes to the emergency department it is diabetes medication.   Past Medical History  Diagnosis Date  . Hepatitis C   . Diabetes mellitus   . Chronic hyponatremia     pt denies having   . Alcoholic   . Alcoholic cirrhosis of liver     Pt calls this a false diagnosis. Pt denies  . Thrombocytopenia   . Hyperglycemia    Past Surgical History  Procedure Laterality Date  . Plate in lower right leg  2008   Family History  Problem Relation Age of Onset  . ALS Father    History  Substance Use Topics  . Smoking status: Current Every Day Smoker -- 1.00 packs/day for 40 years    Types: Cigarettes  . Smokeless tobacco: Never Used  . Alcohol Use: 9.0 oz/week    15 Cans of beer per week    Review of Systems  Constitutional: Negative for fever and activity change.  HENT: Negative.   Respiratory: Negative for cough and shortness of breath.   Cardiovascular: Negative for chest pain and leg swelling.  Gastrointestinal: Negative.   Endocrine: Positive for polyuria.  Genitourinary: Positive for frequency. Negative for dysuria.  Skin: Negative.   Neurological: Negative for tremors and syncope.    Allergies  Benadryl; Librium; and Sulfa antibiotics  Home Medications   Current Outpatient Rx  Name  Route  Sig  Dispense  Refill  . metFORMIN  (GLUCOPHAGE-XR) 750 MG 24 hr tablet   Oral   Take 1 tablet (750 mg total) by mouth 2 (two) times daily with a meal. For diabetes control   60 tablet   0   . traZODone (DESYREL) 50 MG tablet   Oral   Take 1 tablet (50 mg total) by mouth at bedtime and may repeat dose one time if needed. For sleep   60 tablet   0    BP 137/76  Pulse 77  Temp(Src) 97.5 F (36.4 C) (Oral)  Resp 14  SpO2 98% Physical Exam  Nursing note and vitals reviewed. Constitutional: He is oriented to person, place, and time. He appears well-developed. No distress.  Eyes: Conjunctivae are normal.  Neck: Normal range of motion. Neck supple.  Cardiovascular: Normal rate, regular rhythm and normal heart sounds.   Pulmonary/Chest: Effort normal. He has wheezes. He has no rales.  Musculoskeletal: He exhibits no edema.  Neurological: He is alert and oriented to person, place, and time. He exhibits normal muscle tone.  Skin: Skin is warm and dry. No rash noted.    ED Course   Procedures (including critical care time)  Labs Reviewed - No data to display No results found. 1. Diabetes mellitus type 2 in nonobese   2. Alcoholism   3. Tobacco abuse disorder     MDM  Prescription for metformin 750 mg sustained release twice a  day p.c. #60. Patient was advised that there is an inherent  danger in prescribing medications without having sufficient formation for safe prescribing. It is important that he read his instructions, check his blood sugars often and obtain a primary care doctor by calling the phone numbers above. And giving information regarding the patient's health care in diabetes care he stated he did not need to hear a lecture, he just wanted a prescription for his metformin.  Janne Napoleon, NP 12/25/12 [phone]

## 2013-03-13 ENCOUNTER — Emergency Department (HOSPITAL_COMMUNITY)
Admission: EM | Admit: 2013-03-13 | Discharge: 2013-03-13 | Disposition: A | Discharge status: Home / Self Care | Payer: Self-pay | Attending: Emergency Medicine | Admitting: Emergency Medicine

## 2013-03-13 ENCOUNTER — Encounter (HOSPITAL_COMMUNITY): Payer: Self-pay | Admitting: Emergency Medicine

## 2013-03-13 DIAGNOSIS — K029 Dental caries, unspecified: Secondary | ICD-10-CM | POA: Insufficient documentation

## 2013-03-13 DIAGNOSIS — Z79899 Other long term (current) drug therapy: Secondary | ICD-10-CM | POA: Insufficient documentation

## 2013-03-13 DIAGNOSIS — Z8619 Personal history of other infectious and parasitic diseases: Secondary | ICD-10-CM | POA: Insufficient documentation

## 2013-03-13 DIAGNOSIS — Z862 Personal history of diseases of the blood and blood-forming organs and certain disorders involving the immune mechanism: Secondary | ICD-10-CM | POA: Insufficient documentation

## 2013-03-13 DIAGNOSIS — E119 Type 2 diabetes mellitus without complications: Secondary | ICD-10-CM | POA: Insufficient documentation

## 2013-03-13 DIAGNOSIS — K047 Periapical abscess without sinus: Secondary | ICD-10-CM | POA: Insufficient documentation

## 2013-03-13 DIAGNOSIS — F17200 Nicotine dependence, unspecified, uncomplicated: Secondary | ICD-10-CM | POA: Insufficient documentation

## 2013-03-13 DIAGNOSIS — Z8639 Personal history of other endocrine, nutritional and metabolic disease: Secondary | ICD-10-CM | POA: Insufficient documentation

## 2013-03-13 MED ORDER — TRAMADOL HCL 50 MG PO TABS: 50.0000 mg | ORAL_TABLET | Freq: Four times a day (QID) | ORAL | Status: DC | PRN

## 2013-03-13 MED ORDER — PENICILLIN V POTASSIUM 500 MG PO TABS: 500.0000 mg | ORAL_TABLET | Freq: Three times a day (TID) | ORAL | Status: DC

## 2013-03-13 NOTE — ED Provider Notes (Signed)
CSN: [id number]     Arrival date & time 03/13/13  1758 History  This chart was scribed for non-physician practitioner Jeannett Senior, PA,  working with Varney Biles, MD by Eugenia Mcalpine, ED scribe. This patient was seen in room TR09C/TR09C and the patient's care was started at 7:05 : PM.   Chief Complaint  Patient presents with  . Dental Pain    Patient is a 57 y.o. male presenting with tooth pain. The history is provided by the patient. No language interpreter was used.  Dental Pain Location:  Lower Quality:  Aching Severity:  Mild Onset quality:  Gradual Progression:  Worsening Context: abscess, dental caries, dental fracture and poor dentition   Relieved by:  Nothing Worsened by:  Cold food/drink, hot food/drink and touching Ineffective treatments:  Acetaminophen and NSAIDs Associated symptoms: facial pain and facial swelling   Associated symptoms: no congestion, no fever and no headaches    HPI Comments: [name] is a 57 y.o. male who presents to the Emergency Department complaining of Dental pain onset last night that appeared swollen this morning. Pt took advil and tylenol with no relief. He does not have a dentist. He does not appear to be in any acute distress with no other complaints.   Past Medical History  Diagnosis Date  . Hepatitis C   . Diabetes mellitus   . Chronic hyponatremia     pt denies having   . Alcoholic   . Alcoholic cirrhosis of liver     Pt calls this a false diagnosis. Pt denies  . Thrombocytopenia   . Hyperglycemia    Past Surgical History  Procedure Laterality Date  . Plate in lower right leg  2008   Family History  Problem Relation Age of Onset  . ALS Father    History  Substance Use Topics  . Smoking status: Current Every Day Smoker -- 1.00 packs/day for 40 years    Types: Cigarettes  . Smokeless tobacco: Never Used  . Alcohol Use: 9.0 oz/week    15 Cans of beer per week    Review of Systems  Constitutional: Negative  for fever.  HENT: Positive for dental problem and facial swelling. Negative for congestion and sore throat.   Neurological: Negative for headaches.  All other systems reviewed and are negative.    Allergies  Benadryl; Librium; and Sulfa antibiotics  Home Medications   Current Outpatient Rx  Name  Route  Sig  Dispense  Refill  . metFORMIN (GLUCOPHAGE XR) 750 MG 24 hr tablet      One tablet BID for blood sugar   60 tablet   0   . metFORMIN (GLUCOPHAGE-XR) 750 MG 24 hr tablet   Oral   Take 1 tablet (750 mg total) by mouth 2 (two) times daily with a meal. For diabetes control   60 tablet   0   . traZODone (DESYREL) 50 MG tablet   Oral   Take 1 tablet (50 mg total) by mouth at bedtime and may repeat dose one time if needed. For sleep   60 tablet   0    BP 151/83  Pulse 91  Temp(Src) 97.9 F (36.6 C) (Oral)  Resp 18  SpO2 96% Physical Exam  Constitutional: He appears well-developed and well-nourished. No distress.  HENT:  Head: Normocephalic.  Mouth/Throat: Dental abscesses and dental caries present.  Poor dentition, multiple caries, swelling to lower jaw. Dental abscess around lower right bicuspid. Tender to palpation. No swelling under the  tongue. No trismus  Neck: Neck supple.  Cardiovascular: Normal rate.   Pulmonary/Chest: Effort normal.  Musculoskeletal: He exhibits no edema.  Lymphadenopathy:    He has no cervical adenopathy.  Neurological: He is alert.  Skin: Skin is warm. No rash noted.    ED Course  Procedures (including critical care time) DIAGNOSTIC STUDIES: Oxygen Saturation is 96% on RA, adequate by my interpretation.    COORDINATION OF CARE:    7:13 PM- Pt advised of plan for treatment abx and pt agrees.  Labs Review Labs Reviewed - No data to display Imaging Review No results found.  EKG Interpretation   None       MDM   1. Dental abscess     Patient with poor dentition multiple dental caries. He looks like starting to  develop an abscess to the right lower bicuspid and first molar. There is no clear drainable area this time. Will start on antibiotics, penicillin ordered. Will discharge home with Ultram and ibuprofen for his pain. He's to followup with a dentist. Given resources for followup.  Filed Vitals:   03/13/13 1804  BP: 151/83  Pulse: 91  Temp: 97.9 F (36.6 C)  TempSrc: Oral  Resp: 18  SpO2: 96%    I personally performed the services described in this documentation, which was scribed in my presence. The recorded information has been reviewed and is accurate.    Renold Genta, PA-C 03/13/13 2359

## 2013-03-13 NOTE — ED Notes (Signed)
PT reports a broken tooth on RT lower jaw. Pt reports pain to be 10/10.

## 2013-03-17 NOTE — ED Provider Notes (Signed)
Medical screening examination/treatment/procedure(s) were performed by non-physician practitioner and as supervising physician I was immediately available for consultation/collaboration.  EKG Interpretation   None        Varney Biles, MD 03/17/13 [phone]

## 2013-04-24 ENCOUNTER — Emergency Department (HOSPITAL_COMMUNITY)
Admission: EM | Admit: 2013-04-24 | Discharge: 2013-04-24 | Disposition: A | Discharge status: Home / Self Care | Payer: Self-pay | Attending: Emergency Medicine | Admitting: Emergency Medicine

## 2013-04-24 ENCOUNTER — Encounter (HOSPITAL_COMMUNITY): Payer: Self-pay | Admitting: Emergency Medicine

## 2013-04-24 DIAGNOSIS — B1920 Unspecified viral hepatitis C without hepatic coma: Secondary | ICD-10-CM | POA: Insufficient documentation

## 2013-04-24 DIAGNOSIS — H6690 Otitis media, unspecified, unspecified ear: Secondary | ICD-10-CM | POA: Insufficient documentation

## 2013-04-24 DIAGNOSIS — Z79899 Other long term (current) drug therapy: Secondary | ICD-10-CM | POA: Insufficient documentation

## 2013-04-24 DIAGNOSIS — F17200 Nicotine dependence, unspecified, uncomplicated: Secondary | ICD-10-CM | POA: Insufficient documentation

## 2013-04-24 DIAGNOSIS — Z760 Encounter for issue of repeat prescription: Secondary | ICD-10-CM | POA: Insufficient documentation

## 2013-04-24 DIAGNOSIS — H6120 Impacted cerumen, unspecified ear: Secondary | ICD-10-CM | POA: Insufficient documentation

## 2013-04-24 DIAGNOSIS — H9120 Sudden idiopathic hearing loss, unspecified ear: Secondary | ICD-10-CM | POA: Insufficient documentation

## 2013-04-24 DIAGNOSIS — H6691 Otitis media, unspecified, right ear: Secondary | ICD-10-CM

## 2013-04-24 DIAGNOSIS — E700 Classical phenylketonuria: Secondary | ICD-10-CM | POA: Insufficient documentation

## 2013-04-24 DIAGNOSIS — E119 Type 2 diabetes mellitus without complications: Secondary | ICD-10-CM | POA: Insufficient documentation

## 2013-04-24 DIAGNOSIS — E701 Other hyperphenylalaninemias: Secondary | ICD-10-CM | POA: Insufficient documentation

## 2013-04-24 DIAGNOSIS — H9319 Tinnitus, unspecified ear: Secondary | ICD-10-CM | POA: Insufficient documentation

## 2013-04-24 MED ORDER — AMOXICILLIN 500 MG PO CAPS: 500.0000 mg | ORAL_CAPSULE | Freq: Three times a day (TID) | ORAL | Status: DC

## 2013-04-24 MED ORDER — METFORMIN HCL ER 750 MG PO TB24: 750.0000 mg | ORAL_TABLET | Freq: Two times a day (BID) | ORAL | Status: DC

## 2013-04-24 NOTE — ED Provider Notes (Signed)
CSN: [id number]     Arrival date & time 04/24/13  1652 History   First MD Initiated Contact with Patient 04/24/13 1826     Chief Complaint  Patient presents with  . Medication Refill  . Ear Problem    HPI  [name] is a 57 y.o. male with a PMH of hepatitis C, DM, chronic hyponatremia, alcoholic cirrhosis, thrombocytopenia, and hyperglycemia who presents to the ED for evaluation of medication refill and ear problem.  History was provided by the patient.  Patient states that he took his last metformin last night.  He state he takes 750 mg twice daily.  He does not have a PCP or someone who is refilling his prescriptions.  He states that he had a refill left over from a previous prescription, which he used last month. The month before that, he was taking Metformin from a refill he get at an urgent care.  Patient states he has not been taking his blood sugars because "he cannot afford it."  He states he can "tell when my blood sugars are high."  He also complains of hearing loss and tinnitus in his right ear for the past few days.  He denies any fevers, rhinorrhea, congestion, sore throat, headaches, dizziness, lightheadedness, or neck pain. He reports hearing loss from his right ear and tinnitus due to his ear pain.     Past Medical History  Diagnosis Date  . Hepatitis C   . Diabetes mellitus   . Chronic hyponatremia     pt denies having   . Alcoholic   . Alcoholic cirrhosis of liver     Pt calls this a false diagnosis. Pt denies  . Thrombocytopenia   . Hyperglycemia    Past Surgical History  Procedure Laterality Date  . Plate in lower right leg  2008   Family History  Problem Relation Age of Onset  . ALS Father    History  Substance Use Topics  . Smoking status: Current Every Day Smoker -- 1.00 packs/day for 40 years    Types: Cigarettes  . Smokeless tobacco: Never Used  . Alcohol Use: 9.0 oz/week    15 Cans of beer per week    Review of Systems  Constitutional:  Negative for fever, chills, activity change, appetite change and fatigue.  HENT: Positive for ear pain, hearing loss and tinnitus. Negative for congestion, ear discharge, facial swelling, mouth sores, postnasal drip, rhinorrhea, sinus pressure, sneezing, sore throat, trouble swallowing and voice change.   Eyes: Negative for pain and visual disturbance.  Respiratory: Negative for cough and shortness of breath.   Cardiovascular: Negative for chest pain.  Gastrointestinal: Negative for nausea, vomiting and abdominal pain.  Musculoskeletal: Negative for myalgias.  Skin: Negative for color change and wound.  Neurological: Negative for dizziness, weakness, light-headedness and headaches.    Allergies  Benadryl; Librium; and Sulfa antibiotics  Home Medications   Current Outpatient Rx  Name  Route  Sig  Dispense  Refill  . acetaminophen (TYLENOL) 500 MG tablet   Oral   Take 1,000 mg by mouth every 6 (six) hours as needed for moderate pain.         Marland Kitchen ibuprofen (ADVIL,MOTRIN) 200 MG tablet   Oral   Take 400 mg by mouth every 6 (six) hours as needed for moderate pain.         . metFORMIN (GLUCOPHAGE-XR) 750 MG 24 hr tablet   Oral   Take 1 tablet (750 mg total) by mouth  2 (two) times daily with a meal. For diabetes control   60 tablet   0    BP 145/82  Pulse 83  Temp(Src) 98.2 F (36.8 C) (Oral)  Resp 14  SpO2 98%  Filed Vitals:   04/24/13 1708  BP: 145/82  Pulse: 83  Temp: 98.2 F (36.8 C)  TempSrc: Oral  Resp: 14  SpO2: 98%    Physical Exam  Nursing note and vitals reviewed. Constitutional: He is oriented to person, place, and time. He appears well-developed and well-nourished. No distress.  HENT:  Head: Normocephalic and atraumatic.  Right Ear: External ear normal.  Left Ear: External ear normal.  Nose: Nose normal.  Mouth/Throat: Oropharynx is clear and moist. No oropharyngeal exudate.  Cerumen impacted in right ear canal making visualization of the right TM  difficulty.  Left TM gray and translucent without erythema or edema.  No erythema to the posterior pharynx.  Decreased hearing with finger rub test on the right compared to the left.  No mastoid or tragal tenderness bilaterally.    Eyes: Conjunctivae and EOM are normal. Pupils are equal, round, and reactive to light. Right eye exhibits no discharge. Left eye exhibits no discharge.  Neck: Normal range of motion. Neck supple.  No LAD.  Cardiovascular: Normal rate, regular rhythm and normal heart sounds.  Exam reveals no gallop and no friction rub.   No murmur heard. Pulmonary/Chest: Effort normal and breath sounds normal. No respiratory distress. He has no wheezes. He has no rales. He exhibits no tenderness.  Abdominal: Soft. He exhibits no distension. There is no tenderness.  Musculoskeletal: Normal range of motion. He exhibits no edema and no tenderness.  Neurological: He is alert and oriented to person, place, and time.  Skin: Skin is warm and dry. He is not diaphoretic.    ED Course  EAR CERUMEN REMOVAL Date/Time: 04/24/2013 7:30 PM Performed by: Vernie Murders K Authorized by: Lucila Maine Consent: Verbal consent obtained. Risks and benefits: risks, benefits and alternatives were discussed Consent given by: patient Patient identity confirmed: verbally with patient Local anesthetic: none Location details: right ear Procedure type: curette Patient sedated: no Patient tolerance: Patient tolerated the procedure well with no immediate complications.   (including critical care time) Labs Review Labs Reviewed - No data to display Imaging Review No results found.  EKG Interpretation   None      Results for orders placed during the hospital encounter of 04/24/13  GLUCOSE, CAPILLARY      Result Value Range   Glucose-Capillary 161 (*) 70 - 99 mg/dL    MDM   [name] is a 57 y.o. male with a PMH of hepatitis C, DM, chronic hyponatremia, alcoholic cirrhosis,  thrombocytopenia, and hyperglycemia who presents to the ED for evaluation of medication refill and ear problem.    Rechecks  7:30 PM = Cerumen removed from right EAC.  Right TM with erythema and mild edema/bulging.  Finger rub test repeated, which revealed improved hearing on the right.      Metformin refilled in the ED.  Patient given resources to obtain further care by a PCP.  Patient's BS 161 and he states they have been well controlled on metformin.  Etiology of right ear pain possibly due to otitis media.  Patient treated with amoxicillin.  Patients hearing loss improved with cerumen removal.  Otitis media infection may also be contributing to his hearing loss and tinnitus.  Patient given ENT follow-up information if his ear condition does  not improve or worsens. Return precautions, discharge instructions, and follow-up was discussed with the patient before discharge.  Patient in agreement with discharge and plan.     Discharge Medication List as of 04/24/2013  7:46 PM    START taking these medications   Details  amoxicillin (AMOXIL) 500 MG capsule Take 1 capsule (500 mg total) by mouth 3 (three) times daily., Starting 04/24/2013, Until Discontinued, Print    !! metFORMIN (GLUCOPHAGE XR) 750 MG 24 hr tablet Take 1 tablet (750 mg total) by mouth 2 (two) times daily with a meal., Starting 04/24/2013, Until Discontinued, Print     !! - Potential duplicate medications found. Please discuss with provider.       Final impressions: 1. Encounter for medication refill   2. Otitis media, right       Harold Hedge Lawrence, PA-C 04/27/13 [phone]

## 2013-04-24 NOTE — ED Notes (Signed)
Pt states he ran out of his metformin yesterday.  Needs more.  States that he is having trouble hearing out of rt ear.

## 2013-04-27 NOTE — ED Provider Notes (Signed)
Medical screening examination/treatment/procedure(s) were performed by non-physician practitioner and as supervising physician I was immediately available for consultation/collaboration.  EKG Interpretation   None         Osvaldo Shipper, MD 04/27/13 [phone]

## 2013-05-20 ENCOUNTER — Emergency Department (HOSPITAL_COMMUNITY)
Admission: EM | Admit: 2013-05-20 | Discharge: 2013-05-20 | Disposition: A | Discharge status: Home / Self Care | Payer: Self-pay | Attending: Emergency Medicine | Admitting: Emergency Medicine

## 2013-05-20 ENCOUNTER — Encounter (HOSPITAL_COMMUNITY): Payer: Self-pay | Admitting: Emergency Medicine

## 2013-05-20 DIAGNOSIS — F17200 Nicotine dependence, unspecified, uncomplicated: Secondary | ICD-10-CM | POA: Insufficient documentation

## 2013-05-20 DIAGNOSIS — F1020 Alcohol dependence, uncomplicated: Secondary | ICD-10-CM | POA: Insufficient documentation

## 2013-05-20 DIAGNOSIS — Z862 Personal history of diseases of the blood and blood-forming organs and certain disorders involving the immune mechanism: Secondary | ICD-10-CM | POA: Insufficient documentation

## 2013-05-20 DIAGNOSIS — E119 Type 2 diabetes mellitus without complications: Secondary | ICD-10-CM | POA: Insufficient documentation

## 2013-05-20 DIAGNOSIS — Z8619 Personal history of other infectious and parasitic diseases: Secondary | ICD-10-CM | POA: Insufficient documentation

## 2013-05-20 DIAGNOSIS — Z8719 Personal history of other diseases of the digestive system: Secondary | ICD-10-CM | POA: Insufficient documentation

## 2013-05-20 DIAGNOSIS — Z760 Encounter for issue of repeat prescription: Secondary | ICD-10-CM | POA: Insufficient documentation

## 2013-05-20 DIAGNOSIS — Z79899 Other long term (current) drug therapy: Secondary | ICD-10-CM | POA: Insufficient documentation

## 2013-05-20 LAB — GLUCOSE, CAPILLARY: GLUCOSE-CAPILLARY: 342 mg/dL — AB (ref 70–99)

## 2013-05-20 MED ORDER — METFORMIN HCL 500 MG PO TABS: 500.0000 mg | ORAL_TABLET | Freq: Two times a day (BID) | ORAL | Status: DC

## 2013-05-20 MED ORDER — LORAZEPAM 1 MG PO TABS: 1.0000 mg | ORAL_TABLET | ORAL | Status: DC

## 2013-05-20 NOTE — ED Provider Notes (Signed)
CSN: [id number]     Arrival date & time 05/20/13  1939 History  This chart was scribed for non-physician practitioner Junius Creamer working with Merryl Hacker, MD by Donato Schultz, ED Scribe. This patient was seen in room WTR6/WTR6 and the patient's care was started at 8:43 PM.       Chief Complaint  Patient presents with  . Alcohol Intoxication    The history is provided by the patient. No language interpreter was used.   HPI Comments: [name] is a 58 y.o. male with a history of Type II DM who presents to the Emergency Department complaining of alcohol dependency.  The patient states that he was sober for six months starting in August of 2014.  He states that he started using alcohol again after the holidays, getting fired, and having relationship problems with his girlfriend.  He states that he had an episode of emesis five days ago as he was going to get his 40 ounce beer.   He states that he has been drinking 2 or 3 40 ounce beers a day for 16 days.  He denies drinking heavily.  He states that he has come to the ED for similar symptoms and has been prescribed with Ativan with complete relief to his symptoms.  He states that he stopped taking Metformin around Christmas time because he ran out of medication.  He states that he does not check his sugar regularly but felt as though his sugar was low today.  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  The patient denies having a PCP. He states that he has been a pack a day smoker for the past 40 years.  He states that he smoked marijuana today.  He states that he has an allergy to Sulfa and Benadryl.    Past Medical History  Diagnosis Date  . Hepatitis C   . Diabetes mellitus   . Chronic hyponatremia     pt denies having   . Alcoholic   . Alcoholic cirrhosis of liver     Pt calls this a false diagnosis. Pt denies  . Thrombocytopenia   . Hyperglycemia    Past Surgical History  Procedure Laterality Date   . Plate in lower right leg  2008   Family History  Problem Relation Age of Onset  . ALS Father    History  Substance Use Topics  . Smoking status: Current Every Day Smoker -- 1.00 packs/day for 40 years    Types: Cigarettes  . Smokeless tobacco: Never Used  . Alcohol Use: 9.0 oz/week    15 Cans of beer per week    Review of Systems  Constitutional: Negative for fever, activity change and appetite change.  Gastrointestinal: Negative for nausea, vomiting and abdominal pain.  Endocrine: Positive for polyuria.  Genitourinary: Positive for frequency. Negative for dysuria.  Skin: Negative for rash.  Neurological: Negative for dizziness, weakness and headaches.  Psychiatric/Behavioral: Negative for sleep disturbance.  All other systems reviewed and are negative.    Allergies  Benadryl; Librium; and Sulfa antibiotics  Home Medications   Current Outpatient Rx  Name  Route  Sig  Dispense  Refill  . acetaminophen (TYLENOL) 500 MG tablet   Oral   Take 1,000 mg by mouth every 6 (six) hours as needed for moderate pain.         Marland Kitchen ibuprofen (ADVIL,MOTRIN) 200 MG tablet   Oral   Take 400 mg by  mouth every 6 (six) hours as needed for moderate pain.         . metFORMIN (GLUCOPHAGE XR) 750 MG 24 hr tablet   Oral   Take 1 tablet (750 mg total) by mouth 2 (two) times daily with a meal.   30 tablet   0   . LORazepam (ATIVAN) 1 MG tablet   Oral   Take 1 tablet (1 mg total) by mouth as directed.   12 tablet   0     1 mg tablet with breakfast 46m tablet with lunch, ...   . metFORMIN (GLUCOPHAGE) 500 MG tablet   Oral   Take 1 tablet (500 mg total) by mouth 2 (two) times daily with a meal.   60 tablet   3    Triage Vitals: BP 167/89  Pulse 88  Temp(Src) 97.6 F (36.4 C) (Oral)  Resp 16  SpO2 98%  Physical Exam  Nursing note and vitals reviewed. Constitutional: He is oriented to person, place, and time. He appears well-developed and well-nourished. No distress.   HENT:  Head: Normocephalic and atraumatic.  Eyes: Conjunctivae and EOM are normal. Pupils are equal, round, and reactive to light.  Neck: Normal range of motion and phonation normal.  Cardiovascular: Normal rate, regular rhythm, normal heart sounds and intact distal pulses.  Exam reveals no gallop and no friction rub.   No murmur heard. Pulmonary/Chest: Effort normal and breath sounds normal. No respiratory distress. He has no wheezes. He has no rales. He exhibits no bony tenderness.  Abdominal: Normal appearance.  Musculoskeletal: Normal range of motion.  Neurological: He is alert and oriented to person, place, and time. No cranial nerve deficit or sensory deficit. He exhibits normal muscle tone. Coordination normal.  Skin: Skin is warm, dry and intact.  Psychiatric: He has a normal mood and affect. His behavior is normal. Judgment and thought content normal.    ED Course  Procedures (including critical care time)  DIAGNOSTIC STUDIES: Oxygen Saturation is 98% on room air, normal by my interpretation.    COORDINATION OF CARE: 8:49 PM- Discussed obtaining a reading of the patient's glucose levels and discharging the patient with medication.  The patient agreed to the treatment plan.    Labs Review Labs Reviewed  GLUCOSE, CAPILLARY - Abnormal; Notable for the following:    Glucose-Capillary 342 (*)    All other components within normal limits   Imaging Review No results found.  EKG Interpretation   None       MDM   1. Encounter for medication refill   2. Alcohol dependence     Patient has been given a refill for his metformin 500 mg, twice a day he's also been Ativan 1 mg at breakfast and lunch, and 2 mg at bedtime.  For the next 3 days has been encouraged to return to his meetings and to abstain from alcohol use.  Also been given every resource list to help them establish with a primary care physician   I personally performed the services described in this  documentation, which was scribed in my presence. The recorded information has been reviewed and is accurate.  GGarald Balding NP 05/20/13 2133

## 2013-05-20 NOTE — Discharge Instructions (Signed)
Alcohol and Nutrition Nutrition serves two purposes. It provides energy. It also maintains body structure and function. Food supplies energy. It also provides the building blocks needed to replace worn or damaged cells. Alcoholics often eat poorly. This limits their supply of essential nutrients. This affects energy supply and structure maintenance. Alcohol also affects the body's nutrients in:  Digestion.  Storage.  Using and getting rid of waste products. IMPAIRMENT OF NUTRIENT DIGESTION AND UTILIZATION   Once ingested, food must be broken down into small components (digested). Then it is available for energy. It helps maintain body structure and function. Digestion begins in the mouth. It continues in the stomach and intestines, with help from the pancreas. The nutrients from digested food are absorbed from the intestines into the blood. Then they are carried to the liver. The liver prepares nutrients for:  Immediate use.  Storage and future use.  Alcohol inhibits the breakdown of nutrients into usable molecules.  It decreases secretion of digestive enzymes from the pancreas.  Alcohol impairs nutrient absorption by damaging the cells lining the stomach and intestines.  It also interferes with moving some nutrients into the blood.  In addition, nutritional deficiencies themselves may lead to further absorption problems.  For example, folate deficiency changes the cells that line the small intestine. This impairs how water is absorbed. It also affects absorbed nutrients. These include glucose, sodium, and additional folate.  Even if nutrients are digested and absorbed, alcohol can prevent them from being fully used. It changes their transport, storage, and excretion. Impaired utilization of nutrients by alcoholics is indicated by:  Decreased liver stores of vitamins, such as vitamin A.  Increased excretion of nutrients such as fat. ALCOHOL AND ENERGY SUPPLY   Three basic  nutritional components found in food are:  Carbohydrates.  Proteins.  Fats.  These are used as energy. Some alcoholics take in as much as 50% of their total daily calories from alcohol. They often neglect important foods.  Even when enough food is eaten, alcohol can impair the ways the body controls blood sugar (glucose) levels. It may either increase or decrease blood sugar.  In non-diabetic alcoholics, increased blood sugar (hyperglycemia) is caused by poor insulin secretion. It is usually temporary.  Decreased blood sugar (hypoglycemia) can cause serious injury even if this condition is short-lived. Low blood sugar can happen when a fasting or malnourished person drinks alcohol. When there is no food to supply energy, stored sugar is used up. The products of alcohol inhibit forming glucose from other compounds such as amino acids. As a result, alcohol causes the brain and other body tissue to lack glucose. It is needed for energy and function.  Alcohol is an energy source. But how the body processes and uses the energy from alcohol is complex. Also, when alcohol is substituted for carbohydrates, subjects tend to lose weight. This indicates that they get less energy from alcohol than from food. ALCOHOL - MAINTAINING CELL STRUCTURE AND FUNCTION  Structure Cells are made mostly of protein. So an adequate protein diet is important for maintaining cell structure. This is especially true if cells are being damaged. Research indicates that alcohol affects protein nutrition by causing impaired:  Digestion of proteins to amino acids.  Processing of amino acids by the small intestine and liver.  Synthesis of proteins from amino acids.  Protein secretion by the liver. Function Nutrients are essential for the body to function well. They provide the tools that the body needs to work well:  Proteins.  Vitamins.  Minerals. Alcohol can disrupt body function. It may cause nutrient  deficiencies. And it may interfere with the way nutrients are processed. Vitamins  Vitamins are essential to maintain growth and normal metabolism. They regulate many of the body`s processes. Chronic heavy drinking causes deficiencies in many vitamins. This is caused by eating less. And, in some cases, vitamins may be poorly absorbed. For example, alcohol inhibits fat absorption. It impairs how the vitamins A, E, and D are normally absorbed along with dietary fats. Not enough vitamin A may cause night blindness. Not enough vitamin D may cause softening of the bones.  Some alcoholics lack vitamins A, C, D, E, K, and the B vitamins. These are all involved in wound healing and cell maintenance. In particular, because vitamin K is necessary for blood clotting, lacking that vitamin can cause delayed clotting. The result is excess bleeding. Lacking other vitamins involved in brain function may cause severe neurological damage. Minerals Deficiencies of minerals such as calcium, magnesium, iron, and zinc are common in alcoholics. The alcohol itself does not seem to affect how these minerals are absorbed. Rather, they seem to occur secondary to other alcohol-related problems, such as:  Less calcium absorbed.  Not enough magnesium.  More urinary excretion.  Vomiting.  Diarrhea.  Not enough iron due to gastrointestinal bleeding.  Not enough zinc or losses related to other nutrient deficiencies.  Mineral deficiencies can cause a variety of medical consequences. These range from calcium-related bone disease to zinc-related night blindness and skin lesions. ALCOHOL, MALNUTRITION, AND MEDICAL COMPLICATIONS  Liver Disease   Alcoholic liver damage is caused primarily by alcohol itself. But poor nutrition may increase the risk of alcohol-related liver damage. For example, nutrients normally found in the liver are known to be affected by drinking alcohol. These include carotenoids, which are the major  sources of vitamin A, and vitamin E compounds. Decreases in such nutrients may play some role in alcohol-related liver damage. Pancreatitis  Research suggests that malnutrition may increase the risk of developing alcoholic pancreatitis. Research suggests that a diet lacking in protein may increase alcohol's damaging effect on the pancreas. Brain  Nutritional deficiencies may have severe effects on brain function. These may be permanent. Specifically, thiamine deficiencies are often seen in alcoholics. They can cause severe neurological problems. These include:  Impaired movement.  Memory loss seen in Wernicke-Korsakoff syndrome. Pregnancy  Alcohol has toxic effects on fetal development. It causes alcohol-related birth defects. They include fetal alcohol syndrome. Alcohol itself is toxic to the fetus. Also, the nutritional deficiency can affect how the fetus develops. That may compound the risk of developmental damage.  Nutritional needs during pregnancy are 10% to 30% greater than normal. Food intake can increase by as much as 140% to cover the needs of both mother and fetus. An alcoholic mother`s nutritional problems may adversely affect the nutrition of the fetus. And alcohol itself can also restrict nutrition flow to the fetus. NUTRITIONAL STATUS OF ALCOHOLICS  Techniques for assessing nutritional status include:  Taking body measurements to estimate fat reserves. They include:  Weight.  Height.  Mass.  Skin fold thickness.  Performing blood analysis to provide measurements of circulating:  Proteins.  Vitamins.  Minerals.  These techniques tend to be imprecise. For many nutrients, there is no clear "cut-off" point that would allow an accurate definition of deficiency. So assessing the nutritional status of alcoholics is limited by these techniques. Dietary status may provide information about the risk of developing nutritional problems.  Dietary status is assessed by:  Taking  patients' dietary histories.  Evaluating the amount and types of food they are eating.  It is difficult to determine what exact amount of alcohol begins to have damaging effects on nutrition. In general, moderate drinkers have 2 drinks or less per day. They seem to be at little risk for nutritional problems. Various medical disorders begin to appear at greater levels.  Research indicates that the majority of even the heaviest drinkers have few obvious nutritional deficiencies. Many alcoholics who are hospitalized for medical complications of their disease do have severe malnutrition. Alcoholics tend to eat poorly. Often they eat less than the amounts of food necessary to provide enough:  Carbohydrates.  Protein.  Fat.  Vitamins A and C.  B vitamins.  Minerals like calcium and iron. Of major concern is alcohol's effect on digesting food and use of nutrients. It may shift a mildly malnourished person toward severe malnutrition. Document Released: 02/18/2005 Document Revised: 07/19/2011 Document Reviewed: 08/04/2005 [facility] Patient Information 2014 [place].  Finding Treatment for Alcohol and Drug Addiction It can be hard to find the right place to get professional treatment. Here are some important things to consider:  There are different types of treatment to choose from.  Some programs are live-in (residential) while others are not (outpatient). Sometimes a combination is offered.  No single type of program is right for everyone.  Most treatment programs involve a combination of education, counseling, and a 12-step, spiritually-based approach.  There are non-spiritually based programs (not 12-step).  Some treatment programs are government sponsored. They are geared for patients without private insurance.  Treatment programs can vary in many respects such as:  Cost and types of insurance accepted.  Types of on-site medical services offered.  Length of stay, setting,  and size.  Overall philosophy of treatment. A person may need specialized treatment or have needs not addressed by all programs. For example, adolescents need treatment appropriate for their age. Other people have secondary disorders that must be managed as well. Secondary conditions can include mental illness, such as depression or diabetes. Often, a period of detoxification from alcohol or drugs is needed. This requires medical supervision and not all programs offer this. THINGS TO CONSIDER WHEN SELECTING A TREATMENT PROGRAM   Is the program certified by the appropriate government agency? Even private programs must be certified and employ certified professionals.  Does the program accept your insurance? If not, can a payment plan be set up?  Is the facility clean, organized, and well run? Do they allow you to speak with graduates who can share their treatment experience with you? Can you tour the facility? Can you meet with staff?  Does the program meet the full range of individual needs?  Does the treatment program address sexual orientation and physical disabilities? Do they provide age, gender, and culturally appropriate treatment services?  Is treatment available in languages other than English?  Is long-term aftercare support or guidance encouraged and provided?  Is assessment of an individual's treatment plan ongoing to ensure it meets changing needs?  Does the program use strategies to encourage reluctant patients to remain in treatment long enough to increase the likelihood of success?  Does the program offer counseling (individual or group) and other behavioral therapies?  Does the program offer medicine as part of the treatment regimen, if needed?  Is there ongoing monitoring of possible relapse? Is there a defined relapse prevention program? Are services or referrals offered to family members  to ensure they understand addiction and the recovery process? This would help them  support the recovering individual.  Are 12-step meetings held at the center or is transport available for patients to attend outside meetings? In countries outside of the [place] and [place], Surveyor, minerals for contact information for services in your area. Document Released: 03/25/2005 Document Revised: 07/19/2011 Document Reviewed: 10/05/2007 [facility] Patient Information 2014 [place].  Emergency Department Resource Guide 1) Find a Doctor and Pay Out of Pocket Although you won't have to find out who is covered by your insurance plan, it is a good idea to ask around and get recommendations. You will then need to call the office and see if the doctor you have chosen will accept you as a new patient and what types of options they offer for patients who are self-pay. Some doctors offer discounts or will set up payment plans for their patients who do not have insurance, but you will need to ask so you aren't surprised when you get to your appointment.  2) Contact Your Local Health Department Not all health departments have doctors that can see patients for sick visits, but many do, so it is worth a call to see if yours does. If you don't know where your local health department is, you can check in your phone book. The CDC also has a tool to help you locate your state's health department, and many state websites also have listings of all of their local health departments.  3) Find a [facility] If your illness is not likely to be very severe or complicated, you may want to try a walk in clinic. These are popping up all over the country in pharmacies, drugstores, and shopping centers. They're usually staffed by nurse practitioners or physician assistants that have been trained to treat common illnesses and complaints. They're usually fairly quick and inexpensive. However, if you have serious medical issues or chronic medical problems, these are probably not your best option.  No Primary Care  Doctor: - Call Health Connect at  [phone] - they can help you locate a primary care doctor that  accepts your insurance, provides certain services, etc. - Physician Referral Service- [phone]  Chronic Pain Problems: Organization         Address  Phone   Notes  [facility]  [phone] Patients need to be referred by their primary care doctor.   Medication Assistance: Organization         Address  Phone   Notes  [facility] Medication [facility] [address] [place], [place] [address] [phone] --Must be a resident of [facility] -- Must have NO insurance coverage whatsoever (no Medicaid/ Medicare, etc.) -- The pt. MUST have a primary care doctor that directs their care regularly and follows them in the community   Med[facility]ist  [phone]   [employer]  [phone]    Agencies that provide inexpensive medical care: Organization         Address  Phone   Notes  [place]  [phone]   [facility]    [phone]   [facility] [place], [place] [address] [phone]   [place] [phone]   Planned Parenthood    [phone]   [facility]    [phone]   [facility]  [address], [place] Phone:  [phone], Fax:  [phone] Hours of Operation:  9 am - 6 pm, M-F.  Also accepts Medicaid/Medicare and self-pay.  [facility] for [place] [address], [address], [place] Phone: [phone], Fax: [phone]. Hours of Operation:  8:30 am - 5:30 pm, M-F.  Also accepts Medicaid and self-pay.  [facility] [address], [place] Phone: [phone]   [place], [place], [place] [phone], Ext. [phone] Mondays & Thursdays: 7-9 AM.  First 15 patients are seen on a first  come, first serve basis.    [place] Providers:  Organization         Address  Phone   Notes  [facility] [address], [place] [phone] Also accepts self-pay patients.  [facility] [address], [place]  [phone]   [place], [address], [place] [phone]   [facility] [address], [place] [phone]   Lucianne Lei [address], [place]   [phone] Only accepts [place] Access [place] patients after they have their name applied to their card.   Self-Pay (no insurance) in [facility]:  Organization         Address  Phone   Notes  Sickle Cell Patients, [facility] [place] [phone]   [facility] [name] [phone]   [facility] [place]  [place], [place],  [phone]   [facility]/Dr. Osei-Bonsu  [address] [place] or [address], [place] [phone] Phone number for both [place] and [place] locations is the same.  Urgent [facility] and [facility] [address], [place] [phone]   [facility] [address] [place] or [address] [phone]   [facility] [address] [place] [phone], phone; [phone], fax Sees patients 1st and 3rd Saturday of every month.  Must not qualify for public or private insurance (i.e. Medicaid, Medicare, [place] Health Choice, Veterans' Benefits)  Household income should be no more than 200% of the poverty level The clinic cannot treat you if you are pregnant or think you are pregnant  Sexually transmitted diseases are not treated at the clinic.    Dental Care: Organization          Address  Phone  Notes  [facility] Department of [facility] [place] [phone] Accepts children up to age 5 who are enrolled in [place] or [place]; pregnant women with a Medicaid card; and children who have applied for Medicaid or [place] Health Choice, but were declined, whose parents can pay a reduced fee at time of service.  [facility] Department of [facility]  [address], [place] [phone] Accepts children up to age 14 who are enrolled in [place] or [place]; pregnant women with a Medicaid card; and children who have applied for Medicaid or  Health Choice, but were declined, whose parents can pay a reduced fee at time of service.  [facility] Adult Dental Access PROGRAM  [place], [place] [phone]  Patients are seen by appointment only. Walk-ins are not accepted. [place] will see patients 62 years of age and older. Monday - Tuesday (8am-5pm) Most Wednesdays (8:30-5pm) $30 per visit, cash only  [facility] Adult Dental Access PROGRAM  [address], [facility] [phone] Patients are seen by appointment only. Walk-ins are not accepted. [place] will see patients 76 years of age and older. One Wednesday Evening (Monthly: Volunteer Based).  $30 per visit, cash only  [place]  [phone] for adults; Children under age 48, call Graduate Pediatric Dentistry at [phone]. Children aged 49-14, please call [phone] to request a pediatric application.  Dental services are provided in all areas of dental care including fillings, crowns and bridges, complete and partial dentures, implants, gum treatment, root canals, and extractions. Preventive care is also provided. Treatment is provided to both adults and children. Patients are selected via a lottery and there is often a waiting list.   [facility] [address] [place]  [phone] [email or web address]   [facility] [address] [place], [place] [phone], Ext. [phone] Second and Fourth Thursday of each month, opens at 6:30 AM; Clinic ends at 9 AM.  Patients are seen on a first-come first-served basis, and a limited number are seen during each clinic.   [facility]  [address] Hillard Danker [place], [place] [phone]   Eligibility Requirements You must have lived in [place], [place], or [place] counties for at least the last three months.   You cannot be eligible for state or federal sponsored [employer], including [employer], [place], or [employer].   You generally cannot be eligible for healthcare insurance through your employer.    How to apply: Eligibility screenings are held every Tuesday and Wednesday afternoon from 1:00 pm until 4:00 pm. You do not need an appointment for the interview!  [facility] [address] [place], [place]   [place]  [place] Department  [place]  [phone]    Behavioral Health Resources in the Community: Intensive Outpatient Programs Organization         Address  Phone  Notes  [place]. [address] [place], [place] [phone]   [facility] Outpatient [address], [place], [place]   ADS: Alcohol & Drug Svcs [address] [place], [place]   [place] [address],  [place], [place] or [phone]   Substance Abuse Resources Organization         Address  Phone  Notes  Alcohol and Drug Services  [phone]   [place]  [phone]   [facility] [place]   Chinita Pester  [phone]   Residential & Outpatient Substance Abuse Program  [phone]   Psychological  Services Organization         Address  Phone  Notes  [facility] [place]  [place]  [phone]   [place] [address], [place] or [phone]    Mobile Crisis Teams Organization         Address  Phone  Notes  [facility], Mobile Crisis Care Unit  [phone]   [facility]  [address] [place], [place]   [facility] [address], [place]  [place] [phone]    Self-Help/Support Groups Organization         Address  Phone             Notes  [facility] [place] - variety of support groups  [place] Call for more information  Narcotics Anonymous (NA), [facility] [address], [employer] [place]  2 meetings at this location   Special educational needs teacher         Address  Phone  Notes  [facility]idential [facility] [place],    [place]  [phone]   [facility]  [address] [place] [address], [place], [place]   [place], [place] [phone] Admissions: 8am-3pm M-F  Incentives Substance [place] [address],    [place], [place] [phone]   [facility] [address] [place], [place], [place]   [facility] [address]  [place], [place]   [facility] - Intensive Outpatient [address] Kristeen [address], [place], [place]   [facility] ([place]  [place], [place] [phone] or [phone]   [facility] ([facility]) [address] [place], [place] Accepts Medicaid  [facility] [address]  [place] [phone] Substance Abuse/Addiction Treatment   [facility] Organization         Address  Phone  Notes  [facility]  [phone]   Domenic Schwab, PhD [address] Arlis Porta [place], [place]   [phone] or [phone]   [place], [place] [phone]   [facility] [address] [place], [place] [phone] Insurance/Medicaid/sponsorship through [facility] and Families [address] [place]                                    [place], [place] [phone] [place] [address], [place] [phone]    Dr. Adele Schilder  [phone]   [facility] Dept. 1) [address] [place] 2) [place] 3)  [place] [address], [place] [phone]  [phone]   [place] [phone] or [phone] (After Hours)      You have been given prescription for metformin 500 mg twice a day, with 3 refills.  This should give you time to establish with a primary care physician.  You've also been given a prescription for Ativan to help you stop drinking.  1 mg in the morning 1 mg at lunch, and 2 mg at bedtime.  For the next 3 days.  Please  start attending [place] meetings You have also been given a resource guide to help you find an establish with a primary care physician

## 2013-05-20 NOTE — ED Notes (Signed)
NP at bedside at this time

## 2013-05-20 NOTE — ED Notes (Addendum)
Pt arrived to the ED with a complaint of alcohol related problems.  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  Pt states he feels that if he stops drinking he will die.  Pt states that he can not stay in detox or rehab because he needs to work.  Pt states if he has two days of ativan prescription he will be able to stop his drinking.  Pt states that he is a diabetic and has run out of his medicine

## 2013-05-21 NOTE — ED Provider Notes (Signed)
Medical screening examination/treatment/procedure(s) were performed by non-physician practitioner and as supervising physician I was immediately available for consultation/collaboration.  EKG Interpretation   None         Merryl Hacker, MD 05/21/13 [phone]

## 2013-06-19 ENCOUNTER — Encounter (HOSPITAL_COMMUNITY): Payer: Self-pay | Admitting: Emergency Medicine

## 2013-06-19 ENCOUNTER — Inpatient Hospital Stay (HOSPITAL_COMMUNITY)
Admission: AD | Admit: 2013-06-19 | Discharge: 2013-06-25 | DRG: 897 | Disposition: A | Discharge status: Home / Self Care | Payer: No Typology Code available for payment source | Source: Intra-hospital | Attending: Psychiatry | Admitting: Psychiatry

## 2013-06-19 ENCOUNTER — Encounter (HOSPITAL_COMMUNITY): Payer: Self-pay | Admitting: Behavioral Health

## 2013-06-19 ENCOUNTER — Emergency Department (HOSPITAL_COMMUNITY)
Admission: EM | Admit: 2013-06-19 | Discharge: 2013-06-19 | Disposition: A | Discharge status: Other Acute Inpatient Hospital | Payer: No Typology Code available for payment source | Attending: Emergency Medicine | Admitting: Emergency Medicine

## 2013-06-19 DIAGNOSIS — Z862 Personal history of diseases of the blood and blood-forming organs and certain disorders involving the immune mechanism: Secondary | ICD-10-CM | POA: Insufficient documentation

## 2013-06-19 DIAGNOSIS — B1920 Unspecified viral hepatitis C without hepatic coma: Secondary | ICD-10-CM

## 2013-06-19 DIAGNOSIS — Z9289 Personal history of other medical treatment: Secondary | ICD-10-CM

## 2013-06-19 DIAGNOSIS — F1994 Other psychoactive substance use, unspecified with psychoactive substance-induced mood disorder: Secondary | ICD-10-CM | POA: Diagnosis present

## 2013-06-19 DIAGNOSIS — D696 Thrombocytopenia, unspecified: Secondary | ICD-10-CM

## 2013-06-19 DIAGNOSIS — F1010 Alcohol abuse, uncomplicated: Secondary | ICD-10-CM

## 2013-06-19 DIAGNOSIS — R739 Hyperglycemia, unspecified: Secondary | ICD-10-CM

## 2013-06-19 DIAGNOSIS — F329 Major depressive disorder, single episode, unspecified: Secondary | ICD-10-CM | POA: Diagnosis present

## 2013-06-19 DIAGNOSIS — F10929 Alcohol use, unspecified with intoxication, unspecified: Secondary | ICD-10-CM

## 2013-06-19 DIAGNOSIS — F1020 Alcohol dependence, uncomplicated: Secondary | ICD-10-CM | POA: Diagnosis present

## 2013-06-19 DIAGNOSIS — Z8719 Personal history of other diseases of the digestive system: Secondary | ICD-10-CM | POA: Insufficient documentation

## 2013-06-19 DIAGNOSIS — E119 Type 2 diabetes mellitus without complications: Secondary | ICD-10-CM | POA: Insufficient documentation

## 2013-06-19 DIAGNOSIS — F1910 Other psychoactive substance abuse, uncomplicated: Secondary | ICD-10-CM | POA: Diagnosis present

## 2013-06-19 DIAGNOSIS — E871 Hypo-osmolality and hyponatremia: Secondary | ICD-10-CM

## 2013-06-19 DIAGNOSIS — F10229 Alcohol dependence with intoxication, unspecified: Secondary | ICD-10-CM

## 2013-06-19 DIAGNOSIS — Z79899 Other long term (current) drug therapy: Secondary | ICD-10-CM | POA: Insufficient documentation

## 2013-06-19 DIAGNOSIS — E1165 Type 2 diabetes mellitus with hyperglycemia: Secondary | ICD-10-CM

## 2013-06-19 DIAGNOSIS — F10939 Alcohol use, unspecified with withdrawal, unspecified: Principal | ICD-10-CM | POA: Diagnosis present

## 2013-06-19 DIAGNOSIS — Z09 Encounter for follow-up examination after completed treatment for conditions other than malignant neoplasm: Secondary | ICD-10-CM

## 2013-06-19 DIAGNOSIS — F17200 Nicotine dependence, unspecified, uncomplicated: Secondary | ICD-10-CM | POA: Insufficient documentation

## 2013-06-19 DIAGNOSIS — F10239 Alcohol dependence with withdrawal, unspecified: Principal | ICD-10-CM | POA: Diagnosis present

## 2013-06-19 DIAGNOSIS — F411 Generalized anxiety disorder: Secondary | ICD-10-CM | POA: Diagnosis present

## 2013-06-19 DIAGNOSIS — K7030 Alcoholic cirrhosis of liver without ascites: Secondary | ICD-10-CM | POA: Diagnosis present

## 2013-06-19 DIAGNOSIS — F3289 Other specified depressive episodes: Secondary | ICD-10-CM | POA: Diagnosis present

## 2013-06-19 DIAGNOSIS — IMO0001 Reserved for inherently not codable concepts without codable children: Secondary | ICD-10-CM

## 2013-06-19 LAB — CBC
HEMATOCRIT: 43.2 % (ref 39.0–52.0)
Hemoglobin: 15.5 g/dL (ref 13.0–17.0)
MCH: 34.6 pg — ABNORMAL HIGH (ref 26.0–34.0)
MCHC: 35.9 g/dL (ref 30.0–36.0)
MCV: 96.4 fL (ref 78.0–100.0)
Platelets: 86 10*3/uL — ABNORMAL LOW (ref 150–400)
RBC: 4.48 MIL/uL (ref 4.22–5.81)
RDW: 14 % (ref 11.5–15.5)
WBC: 4.9 10*3/uL (ref 4.0–10.5)

## 2013-06-19 LAB — COMPREHENSIVE METABOLIC PANEL
ALBUMIN: 3.5 g/dL (ref 3.5–5.2)
ALT: 117 U/L — ABNORMAL HIGH (ref 0–53)
AST: 125 U/L — ABNORMAL HIGH (ref 0–37)
Alkaline Phosphatase: 221 U/L — ABNORMAL HIGH (ref 39–117)
BUN: 7 mg/dL (ref 6–23)
CO2: 30 mEq/L (ref 19–32)
CREATININE: 0.55 mg/dL (ref 0.50–1.35)
Calcium: 8.6 mg/dL (ref 8.4–10.5)
Chloride: 92 mEq/L — ABNORMAL LOW (ref 96–112)
GFR calc Af Amer: >90 mL/min (ref >90)
GFR calc non Af Amer: >90 mL/min (ref >90)
Glucose, Bld: 471 mg/dL — ABNORMAL HIGH (ref 70–99)
POTASSIUM: 4.1 meq/L (ref 3.7–5.3)
Sodium: 134 mEq/L — ABNORMAL LOW (ref 137–147)
TOTAL PROTEIN: 7.9 g/dL (ref 6.0–8.3)
Total Bilirubin: 0.5 mg/dL (ref 0.3–1.2)

## 2013-06-19 LAB — GLUCOSE, CAPILLARY
GLUCOSE-CAPILLARY: 359 mg/dL — AB (ref 70–99)
Glucose-Capillary: 442 mg/dL — ABNORMAL HIGH (ref 70–99)
Glucose-Capillary: 285 mg/dL — ABNORMAL HIGH (ref 70–99)
Glucose-Capillary: 254 mg/dL — ABNORMAL HIGH (ref 70–99)
Glucose-Capillary: 179 mg/dL — ABNORMAL HIGH (ref 70–99)

## 2013-06-19 LAB — RAPID URINE DRUG SCREEN, HOSP PERFORMED
AMPHETAMINES: NOT DETECTED
BARBITURATES: NOT DETECTED
Benzodiazepines: NOT DETECTED
COCAINE: NOT DETECTED
OPIATES: NOT DETECTED
TETRAHYDROCANNABINOL: NOT DETECTED

## 2013-06-19 LAB — SALICYLATE LEVEL

## 2013-06-19 LAB — ETHANOL: ALCOHOL ETHYL (B): 361 mg/dL — AB (ref 0–11)

## 2013-06-19 LAB — ACETAMINOPHEN LEVEL

## 2013-06-19 MED ORDER — SODIUM CHLORIDE 0.9 % IV BOLUS (SEPSIS)
1000.0000 mL | Freq: Once | INTRAVENOUS | Status: AC
  Administered 2013-06-19: 1000 mL via INTRAVENOUS

## 2013-06-19 MED ORDER — ACETAMINOPHEN 325 MG PO TABS: 650.0000 mg | ORAL_TABLET | ORAL | Status: DC | PRN

## 2013-06-19 MED ORDER — METFORMIN HCL 500 MG PO TABS
500.0000 mg | ORAL_TABLET | Freq: Two times a day (BID) | ORAL | Status: DC
  Filled 2013-06-19: qty 1

## 2013-06-19 MED ORDER — ONDANSETRON HCL 4 MG PO TABS
4.0000 mg | ORAL_TABLET | Freq: Three times a day (TID) | ORAL | Status: DC | PRN
  Administered 2013-06-19: 4 mg via ORAL
  Filled 2013-06-19: qty 1

## 2013-06-19 MED ORDER — VITAMIN B-1 100 MG PO TABS
100.0000 mg | ORAL_TABLET | Freq: Every day | ORAL | Status: DC
  Administered 2013-06-19: 100 mg via ORAL
  Filled 2013-06-19: qty 1

## 2013-06-19 MED ORDER — ALUM & MAG HYDROXIDE-SIMETH 200-200-20 MG/5ML PO SUSP: 30.0000 mL | ORAL | Status: DC | PRN

## 2013-06-19 MED ORDER — LORAZEPAM 1 MG PO TABS
0.0000 mg | ORAL_TABLET | Freq: Four times a day (QID) | ORAL | Status: DC
  Administered 2013-06-19: 2 mg via ORAL
  Filled 2013-06-19: qty 2
  Filled 2013-06-19: qty 1
  Filled 2013-06-19: qty 2

## 2013-06-19 MED ORDER — LORAZEPAM 2 MG/ML IJ SOLN: 0.0000 mg | Freq: Four times a day (QID) | INTRAMUSCULAR | Status: DC

## 2013-06-19 MED ORDER — SODIUM CHLORIDE 0.9 % IV BOLUS (SEPSIS)
1000.0000 mL | Freq: Once | INTRAVENOUS | Status: AC
Start: 2013-06-19 — End: 2013-06-19
  Administered 2013-06-19: 1000 mL via INTRAVENOUS

## 2013-06-19 MED ORDER — ZOLPIDEM TARTRATE 5 MG PO TABS: 5.0000 mg | ORAL_TABLET | Freq: Every evening | ORAL | Status: DC | PRN

## 2013-06-19 MED ORDER — LORAZEPAM 1 MG PO TABS: 0.0000 mg | ORAL_TABLET | Freq: Two times a day (BID) | ORAL | Status: DC

## 2013-06-19 MED ORDER — NICOTINE 21 MG/24HR TD PT24
21.0000 mg | MEDICATED_PATCH | Freq: Every day | TRANSDERMAL | Status: DC
  Administered 2013-06-19: 21 mg via TRANSDERMAL
  Filled 2013-06-19: qty 1

## 2013-06-19 MED ORDER — INSULIN ASPART 100 UNIT/ML ~~LOC~~ SOLN
0.0000 [IU] | SUBCUTANEOUS | Status: DC
  Administered 2013-06-19: 20 [IU] via SUBCUTANEOUS
  Filled 2013-06-19: qty 1

## 2013-06-19 MED ORDER — LORAZEPAM 1 MG PO TABS
1.0000 mg | ORAL_TABLET | Freq: Three times a day (TID) | ORAL | Status: DC | PRN
  Administered 2013-06-19: 1 mg via ORAL

## 2013-06-19 MED ORDER — THIAMINE HCL 100 MG/ML IJ SOLN: 100.0000 mg | Freq: Every day | INTRAMUSCULAR | Status: DC

## 2013-06-19 MED ORDER — IBUPROFEN 200 MG PO TABS
600.0000 mg | ORAL_TABLET | Freq: Three times a day (TID) | ORAL | Status: DC | PRN
  Administered 2013-06-19: 600 mg via ORAL
  Filled 2013-06-19: qty 3

## 2013-06-19 MED ORDER — LORAZEPAM 2 MG/ML IJ SOLN: 0.0000 mg | Freq: Two times a day (BID) | INTRAMUSCULAR | Status: DC

## 2013-06-19 NOTE — ED Notes (Signed)
Per PTAR. Pt requesting detox, drinks etoh daily, admits to drinking at least 3 beers today. PTAR recorded CBG of 442.

## 2013-06-19 NOTE — ED Notes (Signed)
Bed: WA07 Expected date:  Expected time:  Means of arrival:  Comments:

## 2013-06-19 NOTE — BH Assessment (Signed)
Pt accepted to [facility] by Shuvon Rankin,NP. Pt support paperwork completed to include admission checklist, ROI, and voluntary consent form. TTS writer tried to contact EDP Dr.Ward to notify her and was unable to reach her.  Pt assigned to bed 300-2 assigned to the care of Dr. Carlton Adam.   Shaune Pollack, MS, [place]  Assessment Counselor

## 2013-06-19 NOTE — Progress Notes (Signed)
Incoming staff Kathaleen Bury will assess the patient.

## 2013-06-19 NOTE — ED Notes (Signed)
Randall Hiss, [place] notified of pt glucose.

## 2013-06-19 NOTE — ED Provider Notes (Signed)
CSN: [id number]     Arrival date & time 06/19/13  1104 History   First MD Initiated Contact with Patient 06/19/13 1109     Chief Complaint  Patient presents with  . Medical Clearance  . Hyperglycemia     (Consider location/radiation/quality/duration/timing/severity/associated sxs/prior Treatment) HPI Comments: Patient is a 58 year old male with history of hepatitis C, diabetes, chronic hyponatremia, alcoholism who presents today for detox from alcohol. He reports that he's had been sober for 6 months, but fell off the wagon when he was "rescuing a friend". He took it this friend into his house. She began to annoy him which prompted him to spend more time outside of the house. He then felt as though he could "drink one beer with the homeless guys to be nice", but this led to drinking approximately 1 case a day for the past month. His last drink was just prior to arrival. He denies auditory or visual hallucinations. He denies SI/HI, although when asked about suicide he states, "I mean I think about it". He has no plan. Currently he reports he physically feels well without symptoms, but knows they are coming. He does admit to a 20 pound weight loss, but attributes that to not eating while intoxicated.   The history is provided by the patient. No language interpreter was used.    Past Medical History  Diagnosis Date  . Hepatitis C   . Diabetes mellitus   . Chronic hyponatremia     pt denies having   . Alcoholic   . Alcoholic cirrhosis of liver     Pt calls this a false diagnosis. Pt denies  . Thrombocytopenia   . Hyperglycemia    Past Surgical History  Procedure Laterality Date  . Plate in lower right leg  2008   Family History  Problem Relation Age of Onset  . ALS Father    History  Substance Use Topics  . Smoking status: Current Every Day Smoker -- 1.00 packs/day for 40 years    Types: Cigarettes  . Smokeless tobacco: Never Used  . Alcohol Use: 9.0 oz/week    15 Cans of beer  per week     Comment: 18/daily    Review of Systems  Constitutional: Positive for unexpected weight change. Negative for fever and chills.  Respiratory: Negative for shortness of breath.   Cardiovascular: Negative for chest pain.  Gastrointestinal: Negative for nausea, vomiting and abdominal pain.  Endocrine: Negative for polydipsia, polyphagia and polyuria.  Psychiatric/Behavioral: Negative for suicidal ideas. The patient is not nervous/anxious.        Alcohol dependence  All other systems reviewed and are negative.      Allergies  Benadryl; Librium; and Sulfa antibiotics  Home Medications   Current Outpatient Rx  Name  Route  Sig  Dispense  Refill  . metFORMIN (GLUCOPHAGE) 500 MG tablet   Oral   Take 500 mg by mouth 2 (two) times daily with a meal.          BP 152/78  Pulse 83  Temp(Src) 97.6 F (36.4 C) (Oral)  Resp 16  SpO2 95% Physical Exam  Nursing note and vitals reviewed. Constitutional: He is oriented to person, place, and time. He appears well-developed and well-nourished. No distress.  HENT:  Head: Normocephalic and atraumatic.  Right Ear: External ear normal.  Left Ear: External ear normal.  Nose: Nose normal.  Eyes: Conjunctivae are normal.  Neck: Normal range of motion. No tracheal deviation present.  Cardiovascular: Normal rate, regular  rhythm and normal heart sounds.   Pulmonary/Chest: Effort normal and breath sounds normal. No stridor.  Abdominal: Soft. He exhibits no distension. There is no tenderness.  Musculoskeletal: Normal range of motion.  Neurological: He is alert and oriented to person, place, and time.  Skin: Skin is warm and dry. He is not diaphoretic.  Psychiatric: He has a normal mood and affect. His behavior is normal. He expresses no homicidal and no suicidal ideation. He expresses no suicidal plans and no homicidal plans.    ED Course  Procedures (including critical care time) Labs Review Labs Reviewed  GLUCOSE, CAPILLARY -  Abnormal; Notable for the following:    Glucose-Capillary 442 (*)    All other components within normal limits  CBC - Abnormal; Notable for the following:    MCH 34.6 (*)    Platelets 86 (*)    All other components within normal limits  COMPREHENSIVE METABOLIC PANEL - Abnormal; Notable for the following:    Sodium 134 (*)    Chloride 92 (*)    Glucose, Bld 471 (*)    AST 125 (*)    ALT 117 (*)    Alkaline Phosphatase 221 (*)    All other components within normal limits  ETHANOL - Abnormal; Notable for the following:    Alcohol, Ethyl (B) 361 (*)    All other components within normal limits  SALICYLATE LEVEL - Abnormal; Notable for the following:    Salicylate Lvl <9.5 (*)    All other components within normal limits  GLUCOSE, CAPILLARY - Abnormal; Notable for the following:    Glucose-Capillary 285 (*)    All other components within normal limits  ACETAMINOPHEN LEVEL  URINE RAPID DRUG SCREEN (HOSP PERFORMED)   Imaging Review No results found.  EKG Interpretation   None       MDM   Final diagnoses:  Alcohol intoxication  Alcohol abuse  Hepatitis C  Hyperglycemia    Patient presents emergency department for medical clearance. He was found to have a glucose of 471. He is asymptomatic. His blood sugar responded well to 2 L of fluid. Blood sugar down to 285. He does not have an anion gap. Patient is medically cleared for possible placement in detox facility. Patient was placed on a [place] protocol. The patient is hemodynamically stable. Discussed case with Dr. Reather Converse who agrees with plan. Patient / Family / Caregiver informed of clinical course, understand medical decision-making process, and agree with plan.    Elwyn Lade, PA-C 06/19/13 1601

## 2013-06-19 NOTE — Consult Note (Signed)
Patient states that he was in rehab 6 months ago and was sober until about 2 months ago.  Stressor was helping a homeless woman which caused him to abandon his routine and then he relapsed.  Patient states that he drinks 12-24 beers a day.  Wants to get back off of the alcohol.  Patient denies suicidal/homicidal ideation, psychosis, and paranoia.    Agree with TTS assessment for Inpatient Detox  Disposition:  Inpatient detox recommended.  Patient has been accepted to [facility].  Debarah Crape RN will call with room/bed today.  Will continue with Ativan detox protocol related to elevated AST/AST.     [name] Daily FNP-BC

## 2013-06-19 NOTE — BH Assessment (Signed)
Consulted with NP Shuvon and EDP  to obtain clinicals prior to assessing patient.   Shaune Pollack, MS, [place] Assessment Counselor

## 2013-06-19 NOTE — BH Assessment (Signed)
TTS consult completed at 1620.  Shaune Pollack, MS, [place] Assessment Counselor

## 2013-06-19 NOTE — ED Notes (Signed)
Pt given blue scrubs and pt belongings bag and instructed to put on scrubs and place all belongings in bag. Security notified that pt will need to be wanded.

## 2013-06-19 NOTE — ED Notes (Signed)
Patient denies SI, HI, AVH. Reports that he needs detox because "I can't do it at home without help from medications. The Ativan controls my detox symptoms".  Patient glucose high 359, had eaten dinner approx. 1815 and napped. Made [place] and EDP Ward aware. Encouragement offered. Given Novolog. Patient safety maintained, Q 15 checks continue.

## 2013-06-19 NOTE — Tx Team (Signed)
Initial Interdisciplinary Treatment Plan  PATIENT STRENGTHS: (choose at least two) Ability for insight Communication skills General fund of knowledge  PATIENT STRESSORS: Substance abuse   PROBLEM LIST: Problem List/Patient Goals Date to be addressed Date deferred Reason deferred Estimated date of resolution  alcohol detox      "I want to find a place to stay when I am discharged."      " I need rest."                                           DISCHARGE CRITERIA:  Ability to meet basic life and health needs Adequate post-discharge living arrangements Improved stabilization in mood, thinking, and/or behavior Need for constant or close observation no longer present Withdrawal symptoms are absent or subacute and managed without 24-hour nursing intervention  PRELIMINARY DISCHARGE PLAN: Attend aftercare/continuing care group Attend 12-step recovery group Placement in alternative living arrangements Return to previous work or school arrangements  PATIENT/FAMIILY INVOLVEMENT: This treatment plan has been presented to and reviewed with the patient, [name].  The patient and family have been given the opportunity to ask questions and make suggestions.  Pricilla Riffle M 06/19/2013, 11:39 PM

## 2013-06-19 NOTE — ED Notes (Addendum)
2ND LITER INFUSING. NO SIGNS OF WITHDRAWS. RESTING WITH EYES CLOSED. RESPS EVEN AND UNLABORED.

## 2013-06-19 NOTE — ED Notes (Signed)
Notified EDP of Pt.'s B/P, pt. States that when he is going thru detox, his B/P goes up, it will come down as he detoxes.  Will continue to monitor pt.'s V/S.

## 2013-06-19 NOTE — ED Notes (Signed)
METFORMIN ORDERED Y EDP WARD . REQESTED EDP ORDER SSI AC AND HS.

## 2013-06-19 NOTE — ED Provider Notes (Signed)
Medical screening examination/treatment/procedure(s) were performed by non-physician practitioner and as supervising physician I was immediately available for consultation/collaboration.  EKG Interpretation   None         Mariea Clonts, MD 06/19/13 1610

## 2013-06-19 NOTE — Progress Notes (Signed)
CARE MANAGEMENT ED NOTE 06/19/2013  Patient:  [name], [name]   Account Number:  [id number]  Date Initiated:  06/19/2013  Documentation initiated by:  Livia Snellen  Subjective/Objective Assessment:   Patient presents to ED for detox from alcohol     Subjective/Objective Assessment Detail:   Patient with pmhx of Hep C, DM, cirrhosis, alcohol abuse, hyperglycemia.     Action/Plan:   Action/Plan Detail:   Patient for Inpatient alcohol detox   Anticipated DC Date:       Status Recommendation to Physician:   Result of Recommendation:    Other ED [place]  Other  PCP issues    Choice offered to / List presented to:            Status of service:  Completed, signed off  ED Comments:   ED Comments Detail:  Patient confirms he does not have a pcp or insurance.  EDCM provided patient with list of pcps who accept uninsured patients, financial resources in the community such as local churches and [employer], discounted pharmacies and website needymeds.org for medication assistance, information regarding [employer] Act and Medicaid for insurance, phone number to call to inquire about the orange card, [employer], [place] and wellness center, dental assistance foruninsured patients.  Patient thankful for resources.  No further EDCM needs at this time.

## 2013-06-19 NOTE — BH Assessment (Signed)
Assessment Note  [name] is an 58 y.o. male. Pt presents to [facility] with Chief C/O Medical Clearance and is requesting detox from etoh.  Pt reports that he called EMS today because he wanted etoh  detox and they transferred him to the hospital from a local [place]. Pt presents intoxicated, agitated, and angry.  Pt is requesting Detox from Etoh(beer). Pt reports that he relapsed on Etoh after being sober for 6 months. Pt reports that he relapse in December 2014. Pt presents beligerant and rude and states he does not know why he is being asked such stupid assessment questions.  Pt reports increased etoh  use over the past month. Pt reports that he consumes an average of 150oz of etoh(beer)daily. Pt reports his last use on 06-21-13 in the amount of 3(16)oz beers. Pt reports that he was at an [employer] and moved out about a month ago because he started drinking again. Pt reports that he is basically homeless and has been staying in a hotel since leaving the [place].  Pt denies SI,HI, and no AVH reported. Pt denies hx of Dt's or seizures. Inpatient detox treatment recommended.   Patient accepted to [facility] by [facility],NP and is currently pending a bed at [facility]. EDP Dr.Ward has been notified of this. Debarah Crape [facility] will contact TTS when pt's bed is ready.      Axis I: 303.9 Alcohol Dependence Axis II: Deferred Axis III:  Past Medical History  Diagnosis Date  . Hepatitis C   . Diabetes mellitus   . Chronic hyponatremia     pt denies having   . Alcoholic   . Alcoholic cirrhosis of liver     Pt calls this a false diagnosis. Pt denies  . Thrombocytopenia   . Hyperglycemia    Axis IV: economic problems, housing problems, other psychosocial or environmental problems and problems related to social environment Axis V: 31-40 impairment in reality testing  Past Medical History:  Past Medical History  Diagnosis Date  . Hepatitis C   . Diabetes mellitus   . Chronic  hyponatremia     pt denies having   . Alcoholic   . Alcoholic cirrhosis of liver     Pt calls this a false diagnosis. Pt denies  . Thrombocytopenia   . Hyperglycemia     Past Surgical History  Procedure Laterality Date  . Plate in lower right leg  2008    Family History:  Family History  Problem Relation Age of Onset  . ALS Father     Social History:  reports that he has been smoking Cigarettes.  He has a 40 pack-year smoking history. He has never used smokeless tobacco. He reports that he drinks about 9.0 ounces of alcohol per week. He reports that he does not use illicit drugs.  Additional Social History:  Alcohol / Drug Use History of alcohol / drug use?: Yes Substance #1 Name of Substance 1:  (Etoh-Beer) 1 - Age of First Use:  (15) 1 - Amount (size/oz):  (150oz on average) 1 - Frequency:  (Daily) 1 - Duration:  (on-going use-increase use over past month) 1 - Last Use / Amount:  (06-19-13- 3(16oz)beers)  CIWA: CIWA-Ar BP: 161/81 mmHg Pulse Rate: 83 Nausea and Vomiting: no nausea and no vomiting Tactile Disturbances: none Tremor: not visible, but can be felt fingertip to fingertip Auditory Disturbances: not present Paroxysmal Sweats: no sweat visible Visual Disturbances: not present Anxiety: three Headache, Fullness in  Head: none present Agitation: somewhat more than normal activity Orientation and Clouding of Sensorium: oriented and can do serial additions CIWA-Ar Total: 5 COWS:    Allergies:  Allergies  Allergen Reactions  . Benadryl [Diphenhydramine Hcl]     "Causes nervousness"  . Librium [Chlordiazepoxide Hcl] Anxiety  . Sulfa Antibiotics Rash    Home Medications:  (Not in a hospital admission)  OB/GYN Status:  No LMP for male patient.  General Assessment Data Location of Assessment: [facility] ED Is this a Tele or Face-to-Face Assessment?: Face-to-Face Is this an Initial Assessment or a Re-assessment for this encounter?: Initial Assessment Living  Arrangements: Other (Comment) (was staying in a hotel,currently homeless) Can pt return to current living arrangement?: Yes Admission Status: Voluntary Is patient capable of signing voluntary admission?: Yes Transfer from: Other (Comment) Referral Source: Other ([place])     [facility] Crisis Care Plan Living Arrangements: Other (Comment) (was staying in a hotel,currently homeless) Name of Psychiatrist: No Current Provider Name of Therapist: No Current Provider     Risk to self Suicidal Ideation: No Suicidal Intent: No Is patient at risk for suicide?: No Suicidal Plan?: No Access to Means: No What has been your use of drugs/alcohol within the last 12 months?: Etoh-beer Previous Attempts/Gestures: No How many times?: 0 Other Self Harm Risks: none reported Triggers for Past Attempts: None known Intentional Self Injurious Behavior: None Family Suicide History: No Recent stressful life event(s): Financial Problems;Recent negative physical changes;Turmoil (Comment);Other (Comment) (homeless) Persecutory voices/beliefs?: No Depression: Yes Depression Symptoms: Insomnia;Fatigue;Feeling worthless/self pity;Feeling angry/irritable Substance abuse history and/or treatment for substance abuse?: Yes Suicide prevention information given to non-admitted patients: Not applicable  Risk to Others Homicidal Ideation: No Thoughts of Harm to Others: No Current Homicidal Intent: No Current Homicidal Plan: No Access to Homicidal Means: No Identified Victim: na History of harm to others?: No Assessment of Violence: None Noted Violent Behavior Description: None Noted Does patient have access to weapons?: No Criminal Charges Pending?: No (Pt reports prior hx of DWI "awhile back") Does patient have a court date: No  Psychosis Hallucinations: None noted Delusions: None noted  Mental Status Report Appear/Hygiene: Other (Comment) (Disheveled-poor oral hygiene, discolored teeth) Eye Contact:  Poor Motor Activity: Agitation Speech: Argumentative Level of Consciousness: Alert;Irritable Mood: Angry;Irritable Affect: Angry;Irritable Anxiety Level: Minimal Thought Processes: Coherent;Relevant Judgement: Impaired Orientation: Person;Place;Time;Situation Obsessive Compulsive Thoughts/Behaviors: None  Cognitive Functioning Concentration: Normal Memory: Recent Intact;Remote Intact IQ: Average Insight: Fair Impulse Control: Poor Appetite: Poor Weight Loss: 0 Weight Gain: 0 Sleep: Decreased Total Hours of Sleep: 6 Vegetative Symptoms: None  ADLScreening [facility] Assessment Services) Patient's cognitive ability adequate to safely complete daily activities?: Yes Patient able to express need for assistance with ADLs?: Yes Independently performs ADLs?: Yes (appropriate for developmental age)  Prior Inpatient Therapy Prior Inpatient Therapy: Yes Prior Therapy Dates: Unknown Prior Therapy Facilty/Provider(s): [facility] and other facilities that patient refuse to name Reason for Treatment: Detox  Prior Outpatient Therapy Prior Outpatient Therapy: No Prior Therapy Dates: na Prior Therapy Facilty/Provider(s): na Reason for Treatment: na  ADL Screening (condition at time of admission) Patient's cognitive ability adequate to safely complete daily activities?: Yes Is the patient deaf or have difficulty hearing?: No Does the patient have difficulty seeing, even when wearing glasses/contacts?: No Does the patient have difficulty concentrating, remembering, or making decisions?: No Patient able to express need for assistance with ADLs?: Yes Does the patient have difficulty dressing or bathing?: No Independently performs ADLs?: Yes (appropriate for developmental age) Does the patient have  difficulty walking or climbing stairs?: No Weakness of Legs: None Weakness of Arms/Hands: None  Home Assistive Devices/Equipment Home Assistive Devices/Equipment: None    Abuse/Neglect  Assessment (Assessment to be complete while patient is alone) Physical Abuse: Denies Verbal Abuse: Denies Sexual Abuse: Denies Exploitation of patient/patient's resources: Denies Self-Neglect: Denies Values / Beliefs Cultural Requests During Hospitalization: None Spiritual Requests During Hospitalization: None   Advance Directives (For Healthcare) Advance Directive: Patient does not have advance directive;Patient would not like information    Additional Information 1:1 In Past 12 Months?: No CIRT Risk: No Elopement Risk: No Does patient have medical clearance?: Yes     Disposition:  Disposition Initial Assessment Completed for this Encounter: Yes Disposition of Patient: Inpatient treatment program (Pt accepted to [facility] a bed at this time.) Type of inpatient treatment program: Adult  On Site Evaluation by:   Reviewed with Physician:    Wellington Hampshire, MS, LCASA Assessment Counselor  06/19/2013 6:24 PM

## 2013-06-19 NOTE — BH Assessment (Signed)
Patient accepted to [facility] by [facility] Rankin,NP and is currently pending a bed at [facility]. EDP Dr.Ward has been notified of this. Debarah Crape [facility] will contact TTS when pt's bed is ready.  Shaune Pollack, MS, [place] Assessment Counselor

## 2013-06-20 ENCOUNTER — Encounter (HOSPITAL_COMMUNITY): Payer: Self-pay | Admitting: Psychiatry

## 2013-06-20 DIAGNOSIS — F1994 Other psychoactive substance use, unspecified with psychoactive substance-induced mood disorder: Secondary | ICD-10-CM | POA: Diagnosis present

## 2013-06-20 DIAGNOSIS — E119 Type 2 diabetes mellitus without complications: Secondary | ICD-10-CM

## 2013-06-20 DIAGNOSIS — F1020 Alcohol dependence, uncomplicated: Secondary | ICD-10-CM

## 2013-06-20 LAB — GLUCOSE, CAPILLARY
GLUCOSE-CAPILLARY: 234 mg/dL — AB (ref 70–99)
GLUCOSE-CAPILLARY: 241 mg/dL — AB (ref 70–99)
Glucose-Capillary: 274 mg/dL — ABNORMAL HIGH (ref 70–99)
Glucose-Capillary: 213 mg/dL — ABNORMAL HIGH (ref 70–99)

## 2013-06-20 LAB — HEMOGLOBIN A1C
Hgb A1c MFr Bld: 9.8 % — ABNORMAL HIGH (ref <5.7)
MEAN PLASMA GLUCOSE: 235 mg/dL — AB (ref <117)

## 2013-06-20 MED ORDER — LISINOPRIL 10 MG PO TABS
10.0000 mg | ORAL_TABLET | Freq: Every day | ORAL | Status: DC
  Administered 2013-06-21 – 2013-06-25 (×5): 10 mg via ORAL
  Filled 2013-06-20 (×6): qty 1

## 2013-06-20 MED ORDER — LORAZEPAM 1 MG PO TABS
1.0000 mg | ORAL_TABLET | Freq: Four times a day (QID) | ORAL | Status: DC | PRN
  Administered 2013-06-21 – 2013-06-25 (×9): 1 mg via ORAL
  Filled 2013-06-20 (×9): qty 1

## 2013-06-20 MED ORDER — METFORMIN HCL 500 MG PO TABS
500.0000 mg | ORAL_TABLET | Freq: Two times a day (BID) | ORAL | Status: DC
  Administered 2013-06-20 – 2013-06-25 (×11): 500 mg via ORAL
  Filled 2013-06-20 (×16): qty 1

## 2013-06-20 MED ORDER — INSULIN GLARGINE 100 UNIT/ML ~~LOC~~ SOLN
6.0000 [IU] | Freq: Every day | SUBCUTANEOUS | Status: DC
  Administered 2013-06-20 – 2013-06-24 (×5): 6 [IU] via SUBCUTANEOUS

## 2013-06-20 MED ORDER — MAGNESIUM HYDROXIDE 400 MG/5ML PO SUSP
30.0000 mL | Freq: Every day | ORAL | Status: DC | PRN
  Administered 2013-06-24: 30 mL via ORAL

## 2013-06-20 MED ORDER — HYDROXYZINE HCL 25 MG PO TABS
25.0000 mg | ORAL_TABLET | Freq: Four times a day (QID) | ORAL | Status: AC | PRN
  Administered 2013-06-20 – 2013-06-22 (×3): 25 mg via ORAL
  Filled 2013-06-20 (×3): qty 1

## 2013-06-20 MED ORDER — TRAZODONE HCL 50 MG PO TABS
50.0000 mg | ORAL_TABLET | Freq: Every evening | ORAL | Status: DC | PRN
  Administered 2013-06-23: 50 mg via ORAL
  Filled 2013-06-20 (×9): qty 1
  Filled 2013-06-20: qty 28
  Filled 2013-06-20 (×3): qty 1
  Filled 2013-06-20: qty 28
  Filled 2013-06-20: qty 1

## 2013-06-20 MED ORDER — LORAZEPAM 1 MG PO TABS
1.0000 mg | ORAL_TABLET | Freq: Every morning | ORAL | Status: DC
  Filled 2013-06-20: qty 1

## 2013-06-20 MED ORDER — VITAMIN B-1 100 MG PO TABS
100.0000 mg | ORAL_TABLET | Freq: Every day | ORAL | Status: DC
  Administered 2013-06-21 – 2013-06-25 (×5): 100 mg via ORAL
  Filled 2013-06-20 (×6): qty 1

## 2013-06-20 MED ORDER — ACETAMINOPHEN 325 MG PO TABS
650.0000 mg | ORAL_TABLET | Freq: Four times a day (QID) | ORAL | Status: DC | PRN
  Administered 2013-06-21 – 2013-06-24 (×2): 650 mg via ORAL
  Filled 2013-06-20 (×2): qty 2

## 2013-06-20 MED ORDER — LORAZEPAM 1 MG PO TABS
2.0000 mg | ORAL_TABLET | Freq: Four times a day (QID) | ORAL | Status: DC | PRN
  Administered 2013-06-20 (×2): 2 mg via ORAL
  Filled 2013-06-20 (×2): qty 2

## 2013-06-20 MED ORDER — INSULIN ASPART 100 UNIT/ML ~~LOC~~ SOLN
0.0000 [IU] | Freq: Every day | SUBCUTANEOUS | Status: DC
  Administered 2013-06-20 – 2013-06-24 (×4): 2 [IU] via SUBCUTANEOUS

## 2013-06-20 MED ORDER — LORAZEPAM 1 MG PO TABS
1.0000 mg | ORAL_TABLET | Freq: Three times a day (TID) | ORAL | Status: DC
  Administered 2013-06-22 (×3): 1 mg via ORAL
  Filled 2013-06-20 (×3): qty 1

## 2013-06-20 MED ORDER — ALUM & MAG HYDROXIDE-SIMETH 200-200-20 MG/5ML PO SUSP
30.0000 mL | ORAL | Status: DC | PRN
Start: 2013-06-20 — End: 2013-06-25

## 2013-06-20 MED ORDER — LORAZEPAM 1 MG PO TABS
1.0000 mg | ORAL_TABLET | Freq: Two times a day (BID) | ORAL | Status: DC
  Administered 2013-06-23 – 2013-06-25 (×5): 1 mg via ORAL
  Filled 2013-06-20 (×5): qty 1

## 2013-06-20 MED ORDER — ONDANSETRON 4 MG PO TBDP
4.0000 mg | ORAL_TABLET | Freq: Four times a day (QID) | ORAL | Status: AC | PRN
  Administered 2013-06-21: 4 mg via ORAL
  Filled 2013-06-20: qty 1

## 2013-06-20 MED ORDER — LISINOPRIL 20 MG PO TABS
20.0000 mg | ORAL_TABLET | Freq: Once | ORAL | Status: AC
  Administered 2013-06-20: 20 mg via ORAL
  Filled 2013-06-20: qty 1

## 2013-06-20 MED ORDER — LORAZEPAM 1 MG PO TABS
1.0000 mg | ORAL_TABLET | Freq: Four times a day (QID) | ORAL | Status: AC
  Administered 2013-06-20 – 2013-06-21 (×6): 1 mg via ORAL
  Filled 2013-06-20 (×6): qty 1

## 2013-06-20 MED ORDER — LOPERAMIDE HCL 2 MG PO CAPS: 2.0000 mg | ORAL_CAPSULE | ORAL | Status: AC | PRN

## 2013-06-20 MED ORDER — THIAMINE HCL 100 MG/ML IJ SOLN: 100.0000 mg | Freq: Once | INTRAMUSCULAR | Status: DC

## 2013-06-20 MED ORDER — INSULIN ASPART 100 UNIT/ML ~~LOC~~ SOLN
0.0000 [IU] | Freq: Three times a day (TID) | SUBCUTANEOUS | Status: DC
  Administered 2013-06-20: 8 [IU] via SUBCUTANEOUS
  Administered 2013-06-20: 12:00:00 via SUBCUTANEOUS
  Administered 2013-06-20: 5 [IU] via SUBCUTANEOUS
  Administered 2013-06-21: 8 [IU] via SUBCUTANEOUS
  Administered 2013-06-21: 3 [IU] via SUBCUTANEOUS
  Administered 2013-06-21: 5 [IU] via SUBCUTANEOUS
  Administered 2013-06-22 – 2013-06-23 (×3): 8 [IU] via SUBCUTANEOUS
  Administered 2013-06-23: 3 [IU] via SUBCUTANEOUS
  Administered 2013-06-23: 15 [IU] via SUBCUTANEOUS
  Administered 2013-06-24 (×2): 5 [IU] via SUBCUTANEOUS
  Administered 2013-06-24: 11 [IU] via SUBCUTANEOUS
  Administered 2013-06-25: 0 [IU] via SUBCUTANEOUS

## 2013-06-20 MED ORDER — ADULT MULTIVITAMIN W/MINERALS CH
1.0000 | ORAL_TABLET | Freq: Every day | ORAL | Status: DC
  Administered 2013-06-20 – 2013-06-25 (×6): 1 via ORAL
  Filled 2013-06-20 (×8): qty 1

## 2013-06-20 NOTE — Progress Notes (Signed)
Psychoeducational Group Note  Date:  06/20/2013 Time:  2000  Group Topic/Focus:  NA group  Participation Level: Did Not Attend  Participation Quality:  Not Applicable  Affect:  Not Applicable  Cognitive:  Not Applicable  Insight:  Not Applicable  Engagement in Group: Not Applicable  Additional Comments:    Sharmon Revere 06/20/2013, 10:35 PM

## 2013-06-20 NOTE — Progress Notes (Signed)
  D: Pt observed sleeping in bed with eyes closed. RR even and unlabored. No distress noted  .  A: Q 15 minute checks were done for safety.  R: safety maintained on unit.

## 2013-06-20 NOTE — [facility] Suicide Risk Assessment (Signed)
[place] INPATIENT: Family/Significant Other Suicide Prevention Education  Suicide Prevention Education:  Education Completed; No one has been identified by the patient as the family member/significant other with whom the patient will be residing, and identified as the person(s) who will aid the patient in the event of a mental health crisis (suicidal ideations/suicide attempt).   Pt did not c/o SI at admission, nor have they endorsed SI during their stay here. SPE not required. SPI pamphlet provided to pt and he was encouraged to share information with support network, ask questions, and talk about any concerns relating to SPE.   [place], LCSWA 06/20/2013 12:00 PM

## 2013-06-20 NOTE — Tx Team (Signed)
Interdisciplinary Treatment Plan Update (Adult)  Date: 06/20/2013   Time Reviewed: 11:03 AM  Progress in Treatment:  Attending groups: No.  Participating in groups:   No.  Taking medication as prescribed: Yes  Tolerating medication: Yes  Family/Significant othe contact made: No. SPE not required for this pt.  Patient understands diagnosis: Yes, AEB seeking treatment for ETOH detox and mood stabilization.  Discussing patient identified problems/goals with staff: Yes  Medical problems stabilized or resolved: Yes  Denies suicidal/homicidal ideation: Yes during admission/self report/ Patient has not harmed self or Others: Yes  New problem(s) identified: none.  Discharge Plan or Barriers: Pt hoping for admission to [facility]. CSW left msg for Clearnce Sorrel to visit with pt. Pt to follow up with AA for groups but refusing med management followup/therapy followup.  Additional comments: y/o male who presents voluntarily for alcohol detox. Patient states he has been drinking daily for the past 4-5 weeks. Patient states he drinks a 12 pk to a case of beer daily. Patient states he was sober 6 months prior 4-5 weeks ago. Patient states he is currently homeless after getting kicked out of an [facility] for drinking. Patient denies depression but states while he is here he wants to get help with finding housing and he wants to get rest. Patient denies SI/HI and AVH. Reason for Continuation of Hospitalization: Librium taper-withdrawals Mood stabilization (refusing to take mental health meds) Estimated length of stay: 3-5 days  For review of initial/current patient goals, please see plan of care.  Attendees:  Patient:    Family:    Physician: Carlton Adam MD 06/20/2013 11:02 AM   Nursing: Butch Penny RN  06/20/2013 11:02 AM   Clinical Social Worker [place], [place]  06/20/2013 11:02 AM   Other: Grayland Ormond, RN  06/20/2013 11:02 AM   Other: Adonis Huguenin RN 06/20/2013 11:02 AM   Other: Gerline Legacy Nurse CM   06/20/2013 11:02 AM   Other:    Scribe for Treatment Team:  [place] LCSWA 06/20/2013 11:03 AM

## 2013-06-20 NOTE — BHH Group Notes (Signed)
[facility] LCSW Aftercare Discharge Planning Group Note   06/20/2013 9:46 AM  Participation Quality:  DID NOT ATTEND-pt in bed/refused to attend d/c planning group.   Smart, [employer]

## 2013-06-20 NOTE — Progress Notes (Addendum)
Patient ID: [name], male   DOB: 1955/09/26, 58 y.o.   MRN: [id number] He has been in bed today sleeping. Said that the sleep would help him. B/P manually at this time 159/101 p 96 sitting, standing 150/86. C/o of anxiety and had received  Ativan 2 mg prn at 12 noon. He refused to go to lunch. Aggie NP informed of elevated B/P.
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
## 2013-06-20 NOTE — Progress Notes (Signed)
D:  Patient's self inventory sheet, patient has poor sleep, improving appetite, low energy level, poor attention span.  Rated depression 6, hopeless 6, anxiety 7.  Has experienced tremors, chilling, cramps in past 24 hours.  Denied SI.  No discharge plans.  Needs financial assistance to purchase meds after discharge. A:  Medications administered per MD orders.  Emotional support and encouragement given patient. R:  Denied SI and HI.  Denied A/V hallucinations.  Will continue to monitor patient for safety with 15 minute checks.  Safety maintained.
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
## 2013-06-20 NOTE — Consult Note (Signed)
Face to face evaluation and I agree with this report

## 2013-06-20 NOTE — BHH Group Notes (Signed)
[place] LCSW Group Therapy  06/20/2013 3:14 PM  Type of Therapy:  Group Therapy  Participation Level:  Did Not Attend-pt in bed sleeping/refused to attend therapy group.   Smart, Arshdeep Bolger LCSWA 06/20/2013, 3:14 PM
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
## 2013-06-21 DIAGNOSIS — F10229 Alcohol dependence with intoxication, unspecified: Secondary | ICD-10-CM

## 2013-06-21 LAB — GLUCOSE, CAPILLARY
Glucose-Capillary: 276 mg/dL — ABNORMAL HIGH (ref 70–99)
Glucose-Capillary: 161 mg/dL — ABNORMAL HIGH (ref 70–99)
Glucose-Capillary: 243 mg/dL — ABNORMAL HIGH (ref 70–99)
Glucose-Capillary: 232 mg/dL — ABNORMAL HIGH (ref 70–99)

## 2013-06-21 NOTE — Progress Notes (Signed)
Recreation Therapy Notes  Date: 02.11.2015 Time: 2:45pm Location: [address]   Group Topic: Anger Management  Goal Area(s) Addresses:  Patient will identify body's physical reaction to anger.  Patient will identify positive coping mechanisms to deal with anger.  Patient will select one coping mechanism of choice to use post d/c when experiencing anger.   Behavioral Response: Did not attend.    Laureen Ochs Cicely Ortner, LRT/CTRS   Damia Bobrowski L 06/21/2013 9:12 AM

## 2013-06-21 NOTE — Progress Notes (Signed)
Patient ID: [name], male   DOB: [birth year], 58 y.o.   MRN: [id number] He has been in bed except to get his medications and he did go down for lunch.  Continues to say that he will be better and will go to group in a couple of days.  He has refused to fill out his self inventory saying" he did not feel like it. Continues to have w/d complaints, pain of abdomen(that only ativan will help) he  Was given Zofran for nausea that he was not sure if it helped. Again said that ativan was the only thing that helped.

## 2013-06-21 NOTE — Progress Notes (Signed)
Patient ID: [name], male   DOB: 02-05-1956, 58 y.o.   MRN: [id number] D: Patient in room on approach. Pt mood and affect appeared depressed and flat. Pt has been isolative all evening in his room. Pt states he needs to rest for a couple of days to start feeling better.  Pt denies SI/HI/AVH and pain. Pt denies any needs or concerns.  Cooperative with assessment. No acute distressed noted at this time.   A: Met with pt 1:1. Medications administered as prescribed. Writer encouraged pt to discuss feelings. Pt encouraged to come to staff with any question or concerns.   R: Patient remains safe. He is complaint with medications and denies any adverse reaction. Continue current POC.

## 2013-06-21 NOTE — Progress Notes (Signed)
Recreation Therapy Notes  Animal-Assisted Activity/Therapy (AAA/T) Program Checklist/Progress Notes Patient Eligibility Criteria Checklist & Daily Group note for Rec Tx Intervention  Date: 02.12.2015 Time: 2:45pm Location: [address]    AAA/T Program Assumption of Risk Form signed by Patient/ or Parent Legal Guardian yes  Patient is free of allergies or sever asthma yes  Patient reports no fear of animals yes  Patient reports no history of cruelty to animals yes   Patient understands his/her participation is voluntary yes  Behavioral Response: Did not attend.   Laureen Ochs Doyne Ellinger, LRT/CTRS  Lane Hacker 06/21/2013 5:28 PM

## 2013-06-21 NOTE — Progress Notes (Addendum)
[facility] MD Progress Note  06/21/2013 10:42 AM [name]  MRN:  [id number]  Subjective: [name] reports, "I'm not good today. Detoxing from alcohol never feels good.  All the nausea, hurting everywhere and all over, feeling very uncomfortable, not able to sleep well and all the night mares. I can't wait to get over all these symptoms. For me, it takes about 3 days to start to feel better and today is my second day. I'm not depressed. The Vistaril helps my anxiety. I don't like or want Trazodone. No one should be on that trazodone medicine.  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 Hepatitis C   . Diabetes mellitus   . Chronic hyponatremia     pt denies having   . Alcoholic   . Alcoholic cirrhosis of liver     Pt calls this a false diagnosis. Pt denies  . Thrombocytopenia   . Hyperglycemia    Axis IV: Alcoholism, chronic Axis V: 41-50 serious symptoms  ADL's:  Impaired  Sleep: Good  Appetite:  Fair  Suicidal Ideation:  Plan:  Denies Intent:  Denies Means:  Denies  Homicidal Ideation:  Plan:  Denies Intent:  Denies Means:  Denies  AEB (as evidenced by):  Psychiatric Specialty Exam: Physical Exam  Constitutional: He is oriented to person, place, and time. He appears well-developed.  HENT:  Head: Normocephalic.  Eyes: Pupils are equal, round, and reactive to light.  Neck: Normal range of motion.  Cardiovascular: Normal rate.   Respiratory: Effort normal and breath sounds normal.  GI: Soft.  Genitourinary:  Denies any problems in this area  Musculoskeletal: Normal range of motion.  Neurological: He is alert  and oriented to person, place, and time.  Skin: Skin is warm and dry.  Psychiatric: His speech is normal and behavior is normal. His mood appears anxious. His affect is not angry, not blunt, not labile and not inappropriate. He exhibits a depressed mood.    Review of Systems  Constitutional: Positive for chills, malaise/fatigue and diaphoresis.  HENT: Negative.   Eyes: Negative.   Respiratory: Negative.   Cardiovascular: Negative.   Gastrointestinal: Positive for nausea and abdominal pain.  Genitourinary: Negative.   Musculoskeletal: Positive for myalgias.  Skin: Negative.   Neurological: Positive for tremors and weakness.  Endo/Heme/Allergies: Negative.   Psychiatric/Behavioral: Positive for memory loss and substance abuse (Alcoholism). Negative for depression, suicidal ideas and hallucinations. The patient is nervous/anxious (Currently on medication for stabilization) and has insomnia (Currently on medication for stabilization.).     Blood pressure 116/76, pulse 96, temperature 97.6 F (36.4 C), temperature source Oral, resp. rate 16, height 6' (1.829 m), weight 74.39 kg (164 lb), SpO2 95.00%.Body mass index is 22.24 kg/(m^2).  General Appearance: Casual and thin frame  Eye Contact::  Good  Speech:  Clear and Coherent  Volume:  Normal  Mood:  Denies feeling depressed  Affect:  Congruent  Thought Process:  Coherent and Intact  Orientation:  Full (Time, Place, and Person)  Thought Content:  Rumination  Suicidal Thoughts:  No  Homicidal Thoughts:  No  Memory:  Immediate;   Good Recent;   Good Remote;   Good  Judgement:  Fair  Insight:  Fair  Psychomotor Activity:  Restlessness and Tremor  Concentration:  Fair  Recall:  Good  Fund of Knowledge:Good  Language: Good  Akathisia:  No  Handed:  Right  AIMS (if indicated):     Assets:  Desire for Improvement  Sleep:  Number of Hours: 6.75   Musculoskeletal: Strength & Muscle Tone: within normal limits Gait & Station:  normal Patient leans: N/A  Current Medications: Current Facility-Administered Medications  Medication Dose Route Frequency Provider Last Rate Last Dose  . acetaminophen (TYLENOL) tablet 650 mg  650 mg Oral Q6H PRN Laverle Hobby, PA-C      . alum & mag hydroxide-simeth (MAALOX/MYLANTA) 200-200-20 MG/5ML suspension 30 mL  30 mL Oral Q4H PRN Laverle Hobby, PA-C      . hydrOXYzine (ATARAX/VISTARIL) tablet 25 mg  25 mg Oral Q6H PRN Laverle Hobby, PA-C   25 mg at 06/20/13 0246  . insulin aspart (novoLOG) injection 0-15 Units  0-15 Units Subcutaneous TID WC Laverle Hobby, PA-C   8 Units at 06/21/13 [phone]  . insulin aspart (novoLOG) injection 0-5 Units  0-5 Units Subcutaneous QHS Laverle Hobby, PA-C   2 Units at 06/20/13 2202  . insulin glargine (LANTUS) injection 6 Units  6 Units Subcutaneous QHS Laverle Hobby, PA-C   6 Units at 06/20/13 2200  . lisinopril (PRINIVIL,ZESTRIL) tablet 10 mg  10 mg Oral Daily Encarnacion Slates, NP   10 mg at 06/21/13 0757  . loperamide (IMODIUM) capsule 2-4 mg  2-4 mg Oral PRN Laverle Hobby, PA-C      . LORazepam (ATIVAN) tablet 1 mg  1 mg Oral QID Nicholaus Bloom, MD   1 mg at 06/21/13 0757  . [START ON 06/22/2013] LORazepam (ATIVAN) tablet 1 mg  1 mg Oral TID Nicholaus Bloom, MD      . Derrill Memo ON 06/23/2013] LORazepam (ATIVAN) tablet 1 mg  1 mg Oral BID Nicholaus Bloom, MD      . Derrill Memo ON 06/24/2013] LORazepam (ATIVAN) tablet 1 mg  1 mg Oral q morning - 10a Nicholaus Bloom, MD      . LORazepam (ATIVAN) tablet 1 mg  1 mg Oral Q6H PRN Nicholaus Bloom, MD   1 mg at 06/21/13 0553  . magnesium hydroxide (MILK OF MAGNESIA) suspension 30 mL  30 mL Oral Daily PRN Laverle Hobby, PA-C      . metFORMIN (GLUCOPHAGE) tablet 500 mg  500 mg Oral BID WC Laverle Hobby, PA-C   500 mg at 06/21/13 0757  . multivitamin with minerals tablet 1 tablet  1 tablet Oral Daily Laverle Hobby, PA-C   1 tablet at 06/21/13 0757  . ondansetron (ZOFRAN-ODT) disintegrating tablet 4 mg  4 mg Oral Q6H PRN  Laverle Hobby, PA-C      . thiamine (B-1) injection 100 mg  100 mg Intramuscular Once [employer], PA-C      . thiamine (VITAMIN B-1) tablet 100 mg  100 mg Oral Daily Laverle Hobby, PA-C   100 mg at 06/21/13 0757  . traZODone (DESYREL) tablet 50 mg  50 mg Oral QHS,MR X 1 Laverle Hobby, PA-C        Lab Results:  Results for orders placed during the hospital encounter of 06/19/13 (from the past 48 hour(s))  HEMOGLOBIN A1C     Status: Abnormal   Collection Time    06/20/13  6:35 AM  Result Value Ref Range   Hemoglobin A1C 9.8 (*) <5.7 %   Comment: (NOTE)                                                                               According to the ADA Clinical Practice Recommendations for 2011, when     HbA1c is used as a screening test:      >=6.5%   Diagnostic of Diabetes Mellitus               (if abnormal result is confirmed)     5.7-6.4%   Increased risk of developing Diabetes Mellitus     References:Diagnosis and Classification of Diabetes Mellitus,Diabetes     JYNW,2956,21(HYQMV 1):S62-S69 and Standards of Medical Care in             Diabetes - 2011,Diabetes HQIO,9629,52 (Suppl 1):S11-S61.   Mean Plasma Glucose 235 (*) <117 mg/dL   Comment: Performed at [place], [facility]     Status: Abnormal   Collection Time    06/20/13  6:49 AM      Result Value Ref Range   Glucose-Capillary 274 (*) 70 - 99 mg/dL  GLUCOSE, CAPILLARY     Status: Abnormal   Collection Time    06/20/13 11:57 AM      Result Value Ref Range   Glucose-Capillary 213 (*) 70 - 99 mg/dL  GLUCOSE, CAPILLARY     Status: Abnormal   Collection Time    06/20/13  4:39 PM      Result Value Ref Range   Glucose-Capillary 234 (*) 70 - 99 mg/dL  GLUCOSE, CAPILLARY     Status: Abnormal   Collection Time    06/20/13  9:14 PM      Result Value Ref Range   Glucose-Capillary 241 (*) 70 - 99 mg/dL  GLUCOSE, CAPILLARY     Status: Abnormal   Collection Time    06/21/13  6:08 AM       Result Value Ref Range   Glucose-Capillary 276 (*) 70 - 99 mg/dL    Physical Findings: AIMS: Facial and Oral Movements Muscles of Facial Expression: None, normal Lips and Perioral Area: None, normal Jaw: None, normal Tongue: None, normal,Extremity Movements Upper (arms, wrists, hands, fingers): None, normal Lower (legs, knees, ankles, toes): None, normal, Trunk Movements Neck, shoulders, hips: None, normal, Overall Severity Severity of abnormal movements (highest score from questions above): None, normal Incapacitation due to abnormal movements: None, normal Patient's awareness of abnormal movements (rate only patient's report): No Awareness, Dental Status Current problems with teeth and/or dentures?: Yes Does patient usually wear dentures?: No  CIWA:  CIWA-Ar Total: 6 COWS:  COWS Total Score: 5  Treatment Plan Summary: Daily contact with patient to assess and evaluate symptoms and progress in treatment Medication management  Plan: Review of chart, vital signs, medications, and notes. 1-Individual and group therapy 2-Medication management for depression and anxiety:  Medications reviewed with the patient, denies any adverse effects. 3-Coping skills for depression, anxiety, and substance dependency 4-Continue crisis stabilization and management (detox treatment). 5-Address health issues--monitoring vital signs, stable 6-Treatment plan in progress to prevent relapse of depression, substance abuse, and anxiety  Medical Decision  Making Problem Points:  Review of last therapy session (1) and Review of psycho-social stressors (1) Data Points:  Review of medication regiment & side effects (2) Review of new medications or change in dosage (2)  I certify that inpatient services furnished can reasonably be expected to improve the patient's condition.   Encarnacion Slates, PMHNP-BC 06/21/2013, 10:42 AM Agee with assessment and plan Woodroe Chen. [name], M.D.

## 2013-06-21 NOTE — Progress Notes (Signed)
The focus of this group is to educate the patient on the purpose and policies of crisis stabilization and provide a format to answer questions about their admission.  The group details unit policies and expectations of patients while admitted.  Pt did not attend the 0900 nursing group.

## 2013-06-21 NOTE — Clinical Social Work Note (Signed)
CSW spoke with Clearnce Sorrel from [place]. He will visit with pt tonight to discuss pt coming back to his recovery home. Pt owes [facility] $150 and must abide by 90 day rule regarding romantic relationships while in recovery in order to be considered for admission into [facility]. CSW notified pt that Darden Dates will be coming this evening to talk with him. Pt verbalized understanding and asked to go back to sleep.  [place], LCSWA  06/21/2013 3:00 PM

## 2013-06-21 NOTE — BHH Group Notes (Signed)
[place] LCSW Group Therapy  06/21/2013 2:58 PM  Type of Therapy:  Group Therapy  Participation Level:  Did Not Attend-pt in bed sleeping/refused to attend group.   Smart, Rosalyn Archambault LCSWA  06/21/2013, 2:58 PM

## 2013-06-22 LAB — GLUCOSE, CAPILLARY
GLUCOSE-CAPILLARY: 263 mg/dL — AB (ref 70–99)
GLUCOSE-CAPILLARY: 269 mg/dL — AB (ref 70–99)
Glucose-Capillary: 273 mg/dL — ABNORMAL HIGH (ref 70–99)
Glucose-Capillary: 234 mg/dL — ABNORMAL HIGH (ref 70–99)

## 2013-06-22 MED ORDER — GABAPENTIN 100 MG PO CAPS
100.0000 mg | ORAL_CAPSULE | Freq: Three times a day (TID) | ORAL | Status: DC
  Administered 2013-06-22 – 2013-06-25 (×8): 100 mg via ORAL
  Filled 2013-06-22 (×8): qty 1
  Filled 2013-06-22: qty 42
  Filled 2013-06-22: qty 1
  Filled 2013-06-22: qty 42
  Filled 2013-06-22: qty 1
  Filled 2013-06-22: qty 42
  Filled 2013-06-22: qty 1

## 2013-06-22 NOTE — Progress Notes (Signed)
Psychoeducational Group Note  Date:  06/21/2013 Time: 2100 Group Topic/Focus:  wrap up group  Participation Level: Did Not Attend  Participation Quality:  Not Applicable  Affect:  Not Applicable  Cognitive:  Not Applicable  Insight:  Not Applicable  Engagement in Group: Not Applicable  Additional Comments:  Pt remained in bed during group.  Jacques Navy 06/22/2013, 1:56 AM

## 2013-06-22 NOTE — BHH Group Notes (Signed)
[facility] LCSW Aftercare Discharge Planning Group Note   06/22/2013 9:30 AM  Participation Quality:  DID NOT ATTEND-pt in bed resting/refused to attend group.   Smart, [employer]

## 2013-06-22 NOTE — Progress Notes (Signed)
Patient ID: [name], male   DOB: 1955-07-02, 58 y.o.   MRN: [id number] D: Patient in room on approach. Pt mood and affect appeared depressed and flat. Pt has been isolative all evening in his room. Pt came up for his medications and but returned back to his room. Pt encourage to engage in unit activities. Pt denies SI/HI/AVH and pain. Pt denies any needs or concerns.  Cooperative with assessment. No acute distressed noted at this time.   A: Met with pt 1:1. Medications administered as prescribed. Writer encouraged pt to discuss feelings. Pt encouraged to come to staff with any question or concerns.   R: Patient remains safe. He is complaint with medications and denies any adverse reaction. Continue current POC.

## 2013-06-22 NOTE — Progress Notes (Signed)
Patient ID: [name], male   DOB: [birth year], 58 y.o.   MRN: [id number] [facility] MD Progress Note  06/22/2013 2:27 PM HELIODORO DOMAGALSKI  MRN:  [id number]  Subjective: [name] says he is trying to make it still. He says he feels so weak. He adds that feeling nauseated and feeling sick to his stomach makes it hard for him to eat. Reports having the tremors, cold sweats and high anxiety. Says his sleep is poor. Requested something at night to help him relax.  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 Hepatitis C   . Diabetes mellitus   . Chronic hyponatremia     pt denies having   . Alcoholic   . Alcoholic cirrhosis of liver     Pt calls this a false diagnosis. Pt denies  . Thrombocytopenia   . Hyperglycemia    Axis IV: Alcoholism, chronic Axis V: 41-50 serious symptoms  ADL's:  Impaired  Sleep: Good  Appetite:  Fair  Suicidal Ideation:  Plan:  Denies Intent:  Denies Means:  Denies  Homicidal Ideation:  Plan:  Denies Intent:  Denies Means:  Denies  AEB (as evidenced by):  Psychiatric Specialty Exam: Physical Exam  Constitutional: He is oriented to person, place, and time. He appears well-developed.  HENT:  Head: Normocephalic.  Eyes: Pupils are equal, round, and reactive to light.  Neck: Normal range of motion.  Cardiovascular: Normal rate.   Respiratory: Effort normal and breath sounds normal.  GI: Soft.  Genitourinary:  Denies any problems in this area  Musculoskeletal: Normal range of motion.  Neurological: He is alert and oriented to person, place, and time.  Skin: Skin is warm and dry.  Psychiatric:  His speech is normal and behavior is normal. His mood appears anxious. His affect is not angry, not blunt, not labile and not inappropriate. He exhibits a depressed mood.    Review of Systems  Constitutional: Positive for chills, malaise/fatigue and diaphoresis.  HENT: Negative.   Eyes: Negative.   Respiratory: Negative.   Cardiovascular: Negative.   Gastrointestinal: Positive for nausea and abdominal pain.  Genitourinary: Negative.   Musculoskeletal: Positive for myalgias.  Skin: Negative.   Neurological: Positive for tremors and weakness.  Endo/Heme/Allergies: Negative.   Psychiatric/Behavioral: Positive for memory loss and substance abuse (Alcoholism). Negative for depression, suicidal ideas and hallucinations. The patient is nervous/anxious (Currently on medication for stabilization) and has insomnia (Currently on medication for stabilization.).     Blood pressure 133/82, pulse 102, temperature 97.9 F (36.6 C), temperature source Oral, resp. rate 20, height 6' (1.829 m), weight 74.39 kg (164 lb), SpO2 95.00%.Body mass index is 22.24 kg/(m^2).  General Appearance: Casual and thin frame  Eye Contact::  Good  Speech:  Clear and Coherent  Volume:  Normal  Mood:  Denies feeling depressed  Affect:  Congruent  Thought Process:  Coherent and Intact  Orientation:  Full (Time, Place, and Person)  Thought Content:  Rumination  Suicidal Thoughts:  No  Homicidal Thoughts:  No  Memory:  Immediate;   Good Recent;   Good Remote;   Good  Judgement:  Fair  Insight:  Fair  Psychomotor Activity:  Restlessness and Tremor  Concentration:  Fair  Recall:  Good  Fund of Knowledge:Good  Language: Good  Akathisia:  No  Handed:  Right  AIMS (if indicated):     Assets:  Desire for Improvement  Sleep:  Number of Hours: 3.75   Musculoskeletal: Strength & Muscle Tone: within normal limits Gait & Station: normal Patient leans: N/A  Current Medications: Current Facility-Administered Medications   Medication Dose Route Frequency Provider Last Rate Last Dose  . acetaminophen (TYLENOL) tablet 650 mg  650 mg Oral Q6H PRN Laverle Hobby, PA-C   650 mg at 06/21/13 1721  . alum & mag hydroxide-simeth (MAALOX/MYLANTA) 200-200-20 MG/5ML suspension 30 mL  30 mL Oral Q4H PRN Laverle Hobby, PA-C      . hydrOXYzine (ATARAX/VISTARIL) tablet 25 mg  25 mg Oral Q6H PRN Laverle Hobby, PA-C   25 mg at 06/22/13 0522  . insulin aspart (novoLOG) injection 0-15 Units  0-15 Units Subcutaneous TID WC Laverle Hobby, PA-C   8 Units at 06/22/13 [phone]  . insulin aspart (novoLOG) injection 0-5 Units  0-5 Units Subcutaneous QHS Laverle Hobby, PA-C   2 Units at 06/21/13 2211  . insulin glargine (LANTUS) injection 6 Units  6 Units Subcutaneous QHS Laverle Hobby, PA-C   6 Units at 06/21/13 2213  . lisinopril (PRINIVIL,ZESTRIL) tablet 10 mg  10 mg Oral Daily [name] Slates, NP   10 mg at 06/22/13 4481  . loperamide (IMODIUM) capsule 2-4 mg  2-4 mg Oral PRN Laverle Hobby, PA-C      . LORazepam (ATIVAN) tablet 1 mg  1 mg Oral TID Nicholaus Bloom, MD   1 mg at 06/22/13 1205  . [START ON 06/23/2013] LORazepam (ATIVAN) tablet 1 mg  1 mg Oral BID Nicholaus Bloom, MD      . Derrill Memo ON 06/24/2013] LORazepam (ATIVAN) tablet 1 mg  1 mg Oral q morning - 10a Nicholaus Bloom, MD      . LORazepam (ATIVAN) tablet 1 mg  1 mg Oral Q6H PRN Nicholaus Bloom, MD   1 mg at 06/22/13 0130  . magnesium hydroxide (MILK OF MAGNESIA) suspension 30 mL  30 mL Oral Daily PRN Laverle Hobby, PA-C      . metFORMIN (GLUCOPHAGE) tablet 500 mg  500 mg Oral BID WC Laverle Hobby, PA-C   500 mg at 06/22/13 0809  . multivitamin with minerals tablet 1 tablet  1 tablet Oral Daily Laverle Hobby, PA-C   1 tablet at 06/22/13 8563  . ondansetron (ZOFRAN-ODT) disintegrating tablet 4 mg  4 mg Oral Q6H PRN Laverle Hobby, PA-C   4 mg at 06/21/13 1146  . thiamine (B-1) injection 100 mg  100 mg Intramuscular Once [employer], PA-C      . thiamine (VITAMIN B-1)  tablet 100 mg  100 mg Oral Daily Laverle Hobby, PA-C   100 mg at 06/22/13 1497  . traZODone (DESYREL) tablet 50 mg  50 mg Oral QHS,MR X 1 Laverle Hobby, PA-C        Lab Results:  Results for orders placed during the hospital encounter of 06/19/13 (from the past 48 hour(s))  GLUCOSE, CAPILLARY     Status: Abnormal   Collection Time    06/20/13  4:39 PM      Result Value Ref Range   Glucose-Capillary 234 (*) 70 - 99 mg/dL  GLUCOSE, CAPILLARY     Status: Abnormal   Collection Time  06/20/13  9:14 PM      Result Value Ref Range   Glucose-Capillary 241 (*) 70 - 99 mg/dL  GLUCOSE, CAPILLARY     Status: Abnormal   Collection Time    06/21/13  6:08 AM      Result Value Ref Range   Glucose-Capillary 276 (*) 70 - 99 mg/dL  GLUCOSE, CAPILLARY     Status: Abnormal   Collection Time    06/21/13 11:42 AM      Result Value Ref Range   Glucose-Capillary 161 (*) 70 - 99 mg/dL  GLUCOSE, CAPILLARY     Status: Abnormal   Collection Time    06/21/13  5:14 PM      Result Value Ref Range   Glucose-Capillary 243 (*) 70 - 99 mg/dL   Comment 1 Notify RN    GLUCOSE, CAPILLARY     Status: Abnormal   Collection Time    06/21/13  9:20 PM      Result Value Ref Range   Glucose-Capillary 232 (*) 70 - 99 mg/dL   Comment 1 Notify RN     Comment 2 Documented in Chart    GLUCOSE, CAPILLARY     Status: Abnormal   Collection Time    06/22/13  6:18 AM      Result Value Ref Range   Glucose-Capillary 273 (*) 70 - 99 mg/dL   Comment 1 Notify RN    GLUCOSE, CAPILLARY     Status: Abnormal   Collection Time    06/22/13 11:58 AM      Result Value Ref Range   Glucose-Capillary 263 (*) 70 - 99 mg/dL    Physical Findings: AIMS: Facial and Oral Movements Muscles of Facial Expression: None, normal Lips and Perioral Area: None, normal Jaw: None, normal Tongue: None, normal,Extremity Movements Upper (arms, wrists, hands, fingers): None, normal Lower (legs, knees, ankles, toes): None, normal, Trunk  Movements Neck, shoulders, hips: None, normal, Overall Severity Severity of abnormal movements (highest score from questions above): None, normal Incapacitation due to abnormal movements: None, normal Patient's awareness of abnormal movements (rate only patient's report): No Awareness, Dental Status Current problems with teeth and/or dentures?: Yes Does patient usually wear dentures?: No  CIWA:  CIWA-Ar Total: 2 COWS:  COWS Total Score: 5  Treatment Plan Summary: Daily contact with patient to assess and evaluate symptoms and progress in treatment Medication management  Plan: Review of chart, vital signs, medications, and notes. 1-Individual and group therapy 2-Medication management for depression and anxiety:            Initiate Neurontin 100 mg three time daily for substance withdrawal syndrome.          Medications reviewed with the patient, denies any adverse effects. 3-Coping skills for depression, anxiety, and substance dependency 4-Continue crisis stabilization and management (detox treatment). 5-Address health issues--monitoring vital signs, stable 6-Treatment plan in progress to prevent relapse of depression, substance abuse, and anxiety  Medical Decision Making Problem Points:  Review of last therapy session (1) and Review of psycho-social stressors (1) Data Points:  Review of medication regiment & side effects (2) Review of new medications or change in dosage (2)  I certify that inpatient services furnished can reasonably be expected to improve the patient's condition.   [name], PMHNP-BC 06/22/2013, 2:27 PM Agree with assessment and plan Geralyn Flash A. [name], M.D.

## 2013-06-22 NOTE — Progress Notes (Signed)
Pt slept through [employer].

## 2013-06-22 NOTE — Progress Notes (Signed)
D: Patient denies SI/HI or AVH, patient states "I'm safe".  Patient has a depressed mood and anxious affect.  He is cooperative with taking medication but has not been proactive in attending groups.  Pt. Often retreats to his room.  A: Patient given emotional support from RN. Patient encouraged to come to staff with concerns and/or questions. Patient's medication routine continued. Patient's orders and plan of care reviewed.   R: Patient remains appropriate and cooperative. Will continue to monitor patient q15 minutes for safety.

## 2013-06-22 NOTE — Tx Team (Signed)
Interdisciplinary Treatment Plan Update (Adult)  Date: 06/22/2013   Time Reviewed: 10:35 AM  Progress in Treatment:  Attending groups: No.  Participating in groups:   No.  Taking medication as prescribed: Yes  Tolerating medication: Yes  Family/Significant othe contact made: No. SPE not required for this pt.  Patient understands diagnosis: Yes, AEB seeking treatment for ETOH detox and mood stabilization.  Discussing patient identified problems/goals with staff: Yes  Medical problems stabilized or resolved: Yes  Denies suicidal/homicidal ideation: Yes during admission/self report/ Patient has not harmed self or Others: Yes  New problem(s) identified: none.  Discharge Plan or Barriers: Pt not attending d/c planning group and getting up to speak with CSW. Clearnce Sorrel called back stating that pt has not reached out to him yet as told to by CSW. Pt owes Clearnce Sorrel $150 prior to coming back to [facility]. Pt to d/c on Sunday per Dr. Rhona Leavens attending groups or participating in aftercare plan. Pt refusing followup for med management or therapy and plans to attend AA.  Additional comments: Reason for Continuation of Hospitalization: Ativan taper-withdrawals  Mood stabilization (refusing to take mental health meds) Estimated length of stay: 2 days-d/c scheduled for Sunday if pt medically stable.  For review of initial/current patient goals, please see plan of care.  Attendees:  Patient:    Family:    Physician: Carlton Adam MD 06/22/2013 10:35 AM   Nursing:Britney RN  06/22/2013 10:35 AM   Clinical Social Worker [place], [place]  06/22/2013 10:35 AM   Other: Doroteo Bradford RN  06/22/2013 10:35 AM   Other: Hardie Pulley. PA  06/22/2013 10:35 AM   Other: Gerline Legacy Nurse CM  06/22/2013 10:35 AM   Other:    Scribe for Treatment Team:  [place] LCSWA 06/22/2013 10:35 AM

## 2013-06-22 NOTE — Progress Notes (Signed)
Adult Psychoeducational Group Note  Date:  06/22/2013 Time:  11:22 AM  Group Topic/Focus:  Building Self Esteem:   The Focus of this group is helping patients become aware of the effects of self-esteem on their lives, the things they and others do that enhance or undermine their self-esteem, seeing the relationship between their level of self-esteem and the choices they make and learning ways to enhance self-esteem. Self Esteem Action Plan:   The focus of this group is to help patients create a plan to continue to build self-esteem after discharge.  Participation Level:  Did not attend.  Modes of Intervention:  Discussion, Socialization and Support  Additional Comments:  Pts discussed personal experiences of what depletes their self-esteem and what has increased self-esteem in their lives. Pt stated he was not coming to group because he was "too sick."  Clint Bolder 06/22/2013, 11:22 AM

## 2013-06-22 NOTE — Progress Notes (Signed)
Patient ID: [name], male   DOB: August 02, 1955, 58 y.o.   MRN: [id number] D: Patient in room on approach. Pt mood and affect appeared depressed and flat. Pt has been isolative all evening in his room. Pt states he needs to rest for a couple of days to start feeling better.  Pt denies SI/HI/AVH and pain. Pt denies any needs or concerns.  Cooperative with assessment. No acute distressed noted at this time.   A: Met with pt 1:1. Medications administered as prescribed. Writer encouraged pt to discuss feelings. Pt encouraged to come to staff with any question or concerns.   R: Patient remains safe. He is complaint with medications and denies any adverse reaction. Continue current POC.

## 2013-06-22 NOTE — BHH Group Notes (Signed)
[facility] LCSW Group Therapy  06/22/2013 3:03 PM  Type of Therapy:  Group Therapy  Participation Level:  Did Not Attend-pt laid in bed/refused to attend afternoon group.   Smart, Porfirio Bollier LCSWA  06/22/2013, 3:03 PM

## 2013-06-23 LAB — GLUCOSE, CAPILLARY
Glucose-Capillary: 279 mg/dL — ABNORMAL HIGH (ref 70–99)
Glucose-Capillary: 176 mg/dL — ABNORMAL HIGH (ref 70–99)
Glucose-Capillary: 387 mg/dL — ABNORMAL HIGH (ref 70–99)
Glucose-Capillary: 107 mg/dL — ABNORMAL HIGH (ref 70–99)

## 2013-06-23 MED ORDER — INSULIN ASPART 100 UNIT/ML ~~LOC~~ SOLN
4.0000 [IU] | Freq: Three times a day (TID) | SUBCUTANEOUS | Status: DC
  Administered 2013-06-23 – 2013-06-24 (×3): 4 [IU] via SUBCUTANEOUS

## 2013-06-23 NOTE — BHH Group Notes (Signed)
[facility] Group Notes:  (Nursing/MHT/Case Management/Adjunct)  Date:  06/23/2013  Time:  9:45 AM  Type of Therapy:  Nurse Education  Participation Level:  Active  Participation Quality:  Appropriate and Attentive  Affect:  Appropriate  Cognitive:  Alert, Appropriate and Oriented  Insight:  Appropriate and Good  Engagement in Group:  Engaged  Modes of Intervention:  Discussion and Education  Summary of Progress/Problems: Group focused on ways to show those around you that you love and appreciate them and ways to improve on it. Pt states that he tells his significant other that he loves her everyday and writes her letters to show his appreciation.    [name] Minus 06/23/2013, 9:45 AM

## 2013-06-23 NOTE — Progress Notes (Signed)
Patient ID: [name], male   DOB: November 13, 1955, 58 y.o.   MRN: [id number]  D: Patient withdrawn and secluded to his room for majority of the shift. Pt endorsing depression, but denies SI or plans to harm himself. A: Q 15 minute safety checks, encourage staff/peer interaction and group participation. Administer medications as ordered by MD.  R: Pt compliant with medications and attended the first group session of the morning. No inappropriate behaviors noted.

## 2013-06-23 NOTE — Progress Notes (Signed)
[facility] Group Notes:  (Nursing/MHT/Case Management/Adjunct)  Date:  06/23/2013  Time: 2100 Type of Therapy:  wrap up   Participation Level:  Minimal  Participation Quality:  Attentive and Sharing  Affect:  Irritable  Cognitive:  Appropriate  Insight:  Lacking  Engagement in Group:  Limited  Modes of Intervention:  Clarification, Education and Support  Summary of Progress/Problems: Pt plans to discharge to a recovery house and return to work, Associate Professor houses. Pt has a leave of absence from his job.   Jacques Navy 06/23/2013, 10:41 PM

## 2013-06-23 NOTE — BHH Group Notes (Signed)
[facility] Group Notes: (Clinical Social Work)   06/23/2013      Type of Therapy:  Group Therapy   Participation Level:  Did Not Attend    Selmer Dominion, LCSW 06/23/2013, 12:47 PM

## 2013-06-23 NOTE — Progress Notes (Signed)
Patient ID: [name], male   DOB: [birth year], 58 y.o.   MRN: [id number] [facility] MD Progress Note  06/23/2013 12:18 PM  [name]  MRN: [id number]  Subjective: Patient states "I know now that I just can't even have one drink. I mean I knew that but still did it. I think my trigger is when work slows down in the winter. I have more time and get depressed. I'm not feeling as sick as I was when I came here."  Objective:  Patient attended group this morning but staff report that he has been isolating a lot in his room. He appears flat and depressed. Patient talks about his struggle to overcome addiction that has been going on for thirty years. Patient is worried about financial problems and how he will avoid relapse. Patient shows insight into his problems and is actively trying to find solutions. He is easy to engage in conversation and is open with this Provider about his mental health problems. Kalief continues to feel weak physically from his alcohol abuse but appears to be making slow but steady progress in his recovery.  Diagnosis:  DSM5:  Schizophrenia Disorders: NA  Obsessive-Compulsive Disorders: NA  Trauma-Stressor Disorders: NA  Substance/Addictive Disorders: Alcohol Related Disorder - Severe (303.90)  Depressive Disorders: NA  Total Time spent with patient: Greater than 30 minutes  Axis I: Alcohol Related Disorder - Severe (303.90)  Axis II: Deferred  Axis III:  Past Medical History   Diagnosis  Date   .  Hepatitis C    .  Diabetes mellitus    .  Chronic hyponatremia      pt denies having   .  Alcoholic    .  Alcoholic cirrhosis of liver      Pt calls this a false diagnosis. Pt denies   .  Thrombocytopenia    .  Hyperglycemia     Axis IV: Alcoholism, chronic  Axis V: 41-50 serious symptoms  ADL's: Impaired  Sleep: Good  Appetite: Fair  Suicidal Ideation:  Plan: Denies  Intent: Denies  Means: Denies  Homicidal Ideation:  Plan: Denies  Intent: Denies  Means:  Denies  AEB (as evidenced by):  Psychiatric Specialty Exam:  Physical Exam  Constitutional: He is oriented to person, place, and time. He appears well-developed.  HENT:  Head: Normocephalic.  Eyes: Pupils are equal, round, and reactive to light.  Neck: Normal range of motion.  Cardiovascular: Normal rate.  Respiratory: Effort normal and breath sounds normal.  GI: Soft.  Genitourinary:  Denies any problems in this area  Musculoskeletal: Normal range of motion.  Neurological: He is alert and oriented to person, place, and time.  Skin: Skin is warm and dry.  Psychiatric: His speech is normal and behavior is normal. His mood appears anxious. His affect is not angry, not blunt, not labile and not inappropriate. He exhibits a depressed mood.   Review of Systems  Constitutional: Positive for chills, malaise/fatigue and diaphoresis.  HENT: Negative.  Eyes: Negative.  Respiratory: Negative.  Cardiovascular: Negative.  Gastrointestinal: Positive for nausea.  Genitourinary: Negative.  Musculoskeletal: Positive for myalgias.  Skin: Negative.  Neurological: Positive for tremors and weakness.  Endo/Heme/Allergies: Negative.  Psychiatric/Behavioral: Positive for memory loss and substance abuse (Alcoholism). Negative for depression, suicidal ideas and hallucinations. The patient is nervous/anxious (Currently on medication for stabilization) and has insomnia (Currently on medication for stabilization.).   Blood pressure 126/78, pulse 90, temperature 97.6 F (36.4 C), temperature source Oral, resp. rate 18,  height 6' (1.829 m), weight 74.39 kg (164 lb), SpO2 95.00%.Body mass index is 22.24 kg/(m^2).   General Appearance: Casual and thin frame   Eye Contact:: Good   Speech: Clear and Coherent   Volume: Normal   Mood: Denies feeling depressed   Affect: Congruent   Thought Process: Coherent and Intact   Orientation: Full (Time, Place, and Person)   Thought Content: Rumination   Suicidal  Thoughts: No   Homicidal Thoughts: No   Memory: Immediate; Good  Recent; Good  Remote; Good   Judgement: Fair   Insight: Fair   Psychomotor Activity: Restlessness and Tremor   Concentration: Fair   Recall: Good   Fund of Knowledge:Good   Language: Good   Akathisia: No   Handed: Right   AIMS (if indicated):   Assets: Desire for Improvement   Sleep: Number of Hours: 4   Musculoskeletal:  Strength & Muscle Tone: within normal limits  Gait & Station: normal  Patient leans: N/A  Current Medications:  Current Facility-Administered Medications   Medication  Dose  Route  Frequency  Provider  Last Rate  Last Dose   .  acetaminophen (TYLENOL) tablet 650 mg  650 mg  Oral  Q6H PRN  Laverle Hobby, PA-C   650 mg at 06/21/13 1721   .  alum & mag hydroxide-simeth (MAALOX/MYLANTA) 200-200-20 MG/5ML suspension 30 mL  30 mL  Oral  Q4H PRN  Laverle Hobby, PA-C     .  gabapentin (NEURONTIN) capsule 100 mg  100 mg  Oral  TID  Encarnacion Slates, NP   100 mg at 06/23/13 1130   .  insulin aspart (novoLOG) injection 0-15 Units  0-15 Units  Subcutaneous  TID WC  Laverle Hobby, PA-C   3 Units at 06/23/13 1156   .  insulin aspart (novoLOG) injection 0-5 Units  0-5 Units  Subcutaneous  QHS  Laverle Hobby, PA-C   2 Units at 06/22/13 2152   .  insulin glargine (LANTUS) injection 6 Units  6 Units  Subcutaneous  QHS  Laverle Hobby, PA-C   6 Units at 06/22/13 2152   .  lisinopril (PRINIVIL,ZESTRIL) tablet 10 mg  10 mg  Oral  Daily  Encarnacion Slates, NP   10 mg at 06/23/13 1696   .  LORazepam (ATIVAN) tablet 1 mg  1 mg  Oral  BID  Nicholaus Bloom, MD   1 mg at 06/23/13 7893   .  [START ON 06/24/2013] LORazepam (ATIVAN) tablet 1 mg  1 mg  Oral  q morning - 10a  Nicholaus Bloom, MD     .  LORazepam (ATIVAN) tablet 1 mg  1 mg  Oral  Q6H PRN  Nicholaus Bloom, MD   1 mg at 06/23/13 0350   .  magnesium hydroxide (MILK OF MAGNESIA) suspension 30 mL  30 mL  Oral  Daily PRN  Laverle Hobby, PA-C     .  metFORMIN (GLUCOPHAGE)  tablet 500 mg  500 mg  Oral  BID WC  Laverle Hobby, PA-C   500 mg at 06/23/13 8101   .  multivitamin with minerals tablet 1 tablet  1 tablet  Oral  Daily  Laverle Hobby, PA-C   1 tablet at 06/23/13 7510   .  thiamine (B-1) injection 100 mg  100 mg  Intramuscular  Once  [employer], PA-C     .  thiamine (VITAMIN B-1) tablet 100 mg  100 mg  Oral  Daily  Laverle Hobby, PA-C   100 mg at 06/23/13 4782   .  traZODone (DESYREL) tablet 50 mg  50 mg  Oral  QHS,MR X 1  Laverle Hobby, PA-C      Lab Results:  Results for orders placed during the hospital encounter of 06/19/13 (from the past 48 hour(s))   GLUCOSE, CAPILLARY Status: Abnormal    Collection Time    06/21/13 5:14 PM   Result  Value  Ref Range    Glucose-Capillary  243 (*)  70 - 99 mg/dL    Comment 1  Notify RN    GLUCOSE, CAPILLARY Status: Abnormal    Collection Time    06/21/13 9:20 PM   Result  Value  Ref Range    Glucose-Capillary  232 (*)  70 - 99 mg/dL    Comment 1  Notify RN     Comment 2  Documented in Chart    GLUCOSE, CAPILLARY Status: Abnormal    Collection Time    06/22/13 6:18 AM   Result  Value  Ref Range    Glucose-Capillary  273 (*)  70 - 99 mg/dL    Comment 1  Notify RN    GLUCOSE, CAPILLARY Status: Abnormal    Collection Time    06/22/13 11:58 AM   Result  Value  Ref Range    Glucose-Capillary  263 (*)  70 - 99 mg/dL   GLUCOSE, CAPILLARY Status: Abnormal    Collection Time    06/22/13 4:55 PM   Result  Value  Ref Range    Glucose-Capillary  269 (*)  70 - 99 mg/dL   GLUCOSE, CAPILLARY Status: Abnormal    Collection Time    06/22/13 9:13 PM   Result  Value  Ref Range    Glucose-Capillary  234 (*)  70 - 99 mg/dL   GLUCOSE, CAPILLARY Status: Abnormal    Collection Time    06/23/13 6:16 AM   Result  Value  Ref Range    Glucose-Capillary  279 (*)  70 - 99 mg/dL    Comment 1  Notify RN    GLUCOSE, CAPILLARY Status: Abnormal    Collection Time    06/23/13 11:46 AM   Result  Value  Ref Range     Glucose-Capillary  176 (*)  70 - 99 mg/dL    Physical Findings:  AIMS: Facial and Oral Movements  Muscles of Facial Expression: None, normal  Lips and Perioral Area: None, normal  Jaw: None, normal  Tongue: None, normal,Extremity Movements  Upper (arms, wrists, hands, fingers): None, normal  Lower (legs, knees, ankles, toes): None, normal, Trunk Movements  Neck, shoulders, hips: None, normal, Overall Severity  Severity of abnormal movements (highest score from questions above): None, normal  Incapacitation due to abnormal movements: None, normal  Patient's awareness of abnormal movements (rate only patient's report): No Awareness, Dental Status  Current problems with teeth and/or dentures?: No  Does patient usually wear dentures?: No  CIWA: CIWA-Ar Total: 1  COWS: COWS Total Score: 5  Treatment Plan Summary:  Daily contact with patient to assess and evaluate symptoms and progress in treatment  Medication management  Plan: Review of chart, vital signs, medications, and notes.  1-Individual and group therapy  2-Medication management for depression and anxiety:  Continue Neurontin 100 mg three time daily for substance withdrawal syndrome. Continue Ativan taper for alcohol detox.  Medications reviewed with the patient, denies any adverse effects.  3-Coping  skills for depression, anxiety, and substance dependency  4-Continue crisis stabilization and management (detox treatment).  5-Address health issues--monitoring vital signs, stable. CBG readings elevated. Start Novolog 4 units TID WC. Continue Novolog SSI, Metformin 500 mg BID.   6-Treatment plan in progress to prevent relapse of depression, substance abuse, and anxiety  Medical Decision Making  Problem Points: Established problem, stable/improving (1), Review of last therapy session (1) and Review of psycho-social stressors (1)  Data Points: Review of medication regiment & side effects (2)  Review of new medications or change in  dosage (2)  I certify that inpatient services furnished can reasonably be expected to improve the patient's condition.  Elmarie Shiley, NP-C  06/23/2013, 12:18 PM  Patient seen, evaluated and I agree with notes by Nurse Practitioner. Corena Pilgrim, MD

## 2013-06-24 LAB — GLUCOSE, CAPILLARY
GLUCOSE-CAPILLARY: 227 mg/dL — AB (ref 70–99)
GLUCOSE-CAPILLARY: 234 mg/dL — AB (ref 70–99)
Glucose-Capillary: 309 mg/dL — ABNORMAL HIGH (ref 70–99)

## 2013-06-24 MED ORDER — NICOTINE 21 MG/24HR TD PT24
21.0000 mg | MEDICATED_PATCH | Freq: Every day | TRANSDERMAL | Status: DC
  Administered 2013-06-24: 21 mg via TRANSDERMAL
  Filled 2013-06-24 (×3): qty 1

## 2013-06-24 MED ORDER — MAGNESIUM CITRATE PO SOLN
1.0000 | ORAL | Status: AC
  Administered 2013-06-24: 1 via ORAL

## 2013-06-24 MED ORDER — TRAZODONE HCL 50 MG PO TABS: 50.0000 mg | ORAL_TABLET | Freq: Every evening | ORAL | Status: DC | PRN

## 2013-06-24 MED ORDER — METFORMIN HCL 500 MG PO TABS: 500.0000 mg | ORAL_TABLET | Freq: Two times a day (BID) | ORAL | Status: DC

## 2013-06-24 MED ORDER — BISACODYL 10 MG RE SUPP
10.0000 mg | RECTAL | Status: AC
  Administered 2013-06-24: 10 mg via RECTAL
  Filled 2013-06-24 (×2): qty 1

## 2013-06-24 MED ORDER — LISINOPRIL 10 MG PO TABS: 10.0000 mg | ORAL_TABLET | Freq: Every day | ORAL | Status: DC

## 2013-06-24 MED ORDER — INSULIN ASPART 100 UNIT/ML ~~LOC~~ SOLN
6.0000 [IU] | Freq: Three times a day (TID) | SUBCUTANEOUS | Status: DC
  Administered 2013-06-24 – 2013-06-25 (×2): 6 [IU] via SUBCUTANEOUS

## 2013-06-24 MED ORDER — GABAPENTIN 100 MG PO CAPS: 100.0000 mg | ORAL_CAPSULE | Freq: Three times a day (TID) | ORAL | Status: DC

## 2013-06-24 NOTE — BHH Group Notes (Signed)
[facility] Group Notes:  (Clinical Social Work)  06/24/2013  10:00-11:00AM  Summary of Progress/Problems:   The main focus of today's process group was to   identify the patient's current support system and decide on other supports that can be put in place.    There was also an extensive discussion about what constitutes a healthy support versus an unhealthy support.  The patient expressed full comprehension of the concepts presented, and agreed that there is a need to add more supports.  The patient stated the current supports in place are his girlfriend who is sober, friends in [place], and the [place] meetings themselves.  He said he is not going to get a sponsor or buy into the entire [place] program.  He made a number of generalized statements that CSW had to then clarify were his opinions, and ask him to not generalize in order not to insult other group members.  Type of Therapy:  Process Group with Motivational Interviewing  Participation Level:  Active  Participation Quality:  Intrusive, Monopolizing and Sharing  Affect:  Blunted and Depressed  Cognitive:  Appropriate and Oriented  Insight:  Developing/Improving  Engagement in Therapy:  Engaged  Modes of Intervention:   Education, Support and Processing, Activity  [employer], LCSW 06/24/2013, 12:15pm

## 2013-06-24 NOTE — BHH Suicide Risk Assessment (Signed)
Demographic Factors:  Male, Low socioeconomic status and Unemployed  Total Time spent with patient: 20 minutes  Psychiatric Specialty Exam: Physical Exam  Psychiatric: He has a normal mood and affect. His speech is normal and behavior is normal. Judgment and thought content normal. Cognition and memory are normal.    Review of Systems  Constitutional: Negative.   HENT: Negative.   Eyes: Negative.   Respiratory: Negative.   Cardiovascular: Negative.   Gastrointestinal: Negative.   Musculoskeletal: Negative.   Skin: Negative.   Endo/Heme/Allergies: Negative.   Psychiatric/Behavioral: Negative.     Blood pressure 110/71, pulse 90, temperature 97.8 F (36.6 C), temperature source Oral, resp. rate 18, height 6' (1.829 m), weight 74.39 kg (164 lb), SpO2 95.00%.Body mass index is 22.24 kg/(m^2).  General Appearance: Fairly Groomed  Engineer, water::  Good  Speech:  Clear and Coherent  Volume:  Normal  Mood:  Euthymic  Affect:  Appropriate  Thought Process:  Goal Directed and Linear  Orientation:  Full (Time, Place, and Person)  Thought Content:  Negative  Suicidal Thoughts:  No  Homicidal Thoughts:  No  Memory:  Immediate;   Fair Recent;   Fair Remote;   Fair  Judgement:  Fair  Insight:  Fair  Psychomotor Activity:  Normal  Concentration:  Fair  Recall:  [employer] of Knowledge:Good  Language: Fair  Akathisia:  No  Handed:  Right  AIMS (if indicated):     Assets:  Communication Skills Desire for Improvement  Sleep:  Number of Hours: 1.75    Musculoskeletal: Strength & Muscle Tone: within normal limits Gait & Station: normal Patient leans: Right   Mental Status Per Nursing Assessment::   On Admission:  NA  Current Mental Status by Physician: patient denies suicidal ideation, intent or plan  Loss Factors: Financial problems/change in socioeconomic status  Historical Factors: Impulsivity  Risk Reduction Factors:   Sense of responsibility to family and  Positive social support  Continued Clinical Symptoms:  Alcohol/Substance Abuse/Dependencies  Cognitive Features That Contribute To Risk:  Closed-mindedness    Suicide Risk:  Minimal: No identifiable suicidal ideation.  Patients presenting with no risk factors but with morbid ruminations; may be classified as minimal risk based on the severity of the depressive symptoms  Discharge Diagnoses:   AXIS I: Alcohol dependence             Substance induced mood disorder  AXIS II:  Cluster B Traits AXIS III:   Past Medical History  Diagnosis Date  . Hepatitis C   . Diabetes mellitus   . Chronic hyponatremia     pt denies having   . Alcoholic   . Alcoholic cirrhosis of liver     Pt calls this a false diagnosis. Pt denies  . Thrombocytopenia   . Hyperglycemia    AXIS IV:  other psychosocial or environmental problems and problems related to social environment AXIS V:  61-70 mild symptoms  Plan Of Care/Follow-up recommendations:  Activity:  as tolerated Diet:  healthy Tests:  routine blood work up Other:  patient to keep his after care appointment  Is patient on multiple antipsychotic therapies at discharge:  No   Has Patient had three or more failed trials of antipsychotic monotherapy by history:  No  Recommended Plan for Multiple Antipsychotic Therapies: NA    Corena Pilgrim, MD 06/24/2013, 4:59 PM

## 2013-06-24 NOTE — Progress Notes (Signed)
Patient observed alert in bed. States Trazodone and Ativan have not helped him fall asleep. Resting quietly.   Patient safety maintained, Q 15 checks continue.

## 2013-06-24 NOTE — Progress Notes (Addendum)
[facility] Group Notes:  (Nursing/MHT/Case Management/Adjunct)  Date:  06/24/2013  Time:  6:28 PM  Type of Therapy:  Psychoeducational Skills  Participation Level:  Active  Participation Quality:  Appropriate and Attentive  Affect:  Appropriate  Cognitive:  Appropriate  Insight:  Appropriate  Engagement in Group:  Engaged and Supportive  Modes of Intervention:  Activity  Summary of Progress/Problems: Pts played an activity of Pictionary using coping skills. Pts stated one coping skill they have learned while they have been in the hospital that they will use outside of the hospital.  Pt stated one coping skill he will use is reading.  Clint Bolder 06/24/2013, 6:28 PM

## 2013-06-24 NOTE — BHH Group Notes (Signed)
[facility] Group Notes:  (Nursing/MHT/Case Management/Adjunct)  Psychoeducational Group Note  Date:  06/24/2013 Time:  0900  Group Topic/Focus:  Relapse Prevention Planning:   The focus of this group is to define relapse and discuss the need for planning to combat relapse.  Participation Level: Did Not Attend  Participation Quality:  Not Applicable  Affect:  Not Applicable  Cognitive:  Not Applicable  Insight:  Not Applicable  Engagement in Group: Not Applicable  Additional Comments:    Romie Minus 06/24/2013, 9:16 AM

## 2013-06-24 NOTE — BHH Group Notes (Signed)
[facility] Group Notes:  (Nursing/MHT/Case Management/Adjunct)  Date:  06/24/2013  Time:  1:54 PM  Type of Therapy:  Nurse Education  Participation Level:  Active  Participation Quality:  Appropriate and Attentive  Affect:  Appropriate  Cognitive:  Alert, Appropriate and Oriented  Insight:  Appropriate and Good  Engagement in Group:  Engaged  Modes of Intervention:  Discussion and Education  Summary of Progress/Problems: Video on "Dedicating Yourself To Sobriety". Discussion.    Romie Minus 06/24/2013, 1:54 PM

## 2013-06-24 NOTE — Progress Notes (Signed)
Patient did attend the evening speaker [place] meeting.

## 2013-06-24 NOTE — Progress Notes (Signed)
Patient ID: [facility] LIP[facility], male   DOB: [birth year], 58 y.o.   MRN: [id number]  D: Patient pleasant and cooperative with staff/peers. Pt drowsy during the am portion of the shift, but did become more alert and sociable. Pt brightens on approach and joking appropriately with staff. A: Q 15 minute safety checks, encourage staff/peer interaction and group participation. Administrate medications as ordered by MD.  R: Pt compliant with medications and afternoon group sessions. Pt did complain of constipation, NP aware, new orders obtained. Meds given, no results at this time.

## 2013-06-24 NOTE — Progress Notes (Signed)
Patient ID: [name], male   DOB: 10-31-1955, 58 y.o.   MRN: [id number]    [facility] MD Progress Note  06/23/2013 12:18 PM  [name]  MRN: [id number]  Subjective: Patient states "I know what I need to do. I am feeling better and am ready to discharge tomorrow. I must find a way to avoid my triggers that result in me relapsing on alcohol."   Objective:  Patient is visible on the milieu today. He is observed attending to his hygiene and appears better groomed today.  Patient plans to discharge to a recovery house tomorrow morning and get back to work refinishing houses. Patient shows insight into his triggers for relapsing on alcohol. He is very open to discussing his long history of substance abuse with staff. Documentation in epic indicates that the patient was actively participating in group this morning. [name] was able to name several healthy supports in his life including his girlfriend, and AA friends.   Diagnosis:  DSM5:  Schizophrenia Disorders: NA  Obsessive-Compulsive Disorders: NA  Trauma-Stressor Disorders: NA  Substance/Addictive Disorders: Alcohol Related Disorder - Severe (303.90)  Depressive Disorders: NA  Total Time spent with patient: Greater than 30 minutes  Axis I: Alcohol Related Disorder - Severe (303.90)  Axis II: Deferred  Axis III:  Past Medical History   Diagnosis  Date   .  Hepatitis C    .  Diabetes mellitus    .  Chronic hyponatremia      pt denies having   .  Alcoholic    .  Alcoholic cirrhosis of liver      Pt calls this a false diagnosis. Pt denies   .  Thrombocytopenia    .  Hyperglycemia     Axis IV: Alcoholism, chronic  Axis V: 41-50 serious symptoms  ADL's: Impaired  Sleep: Good  Appetite: Fair  Suicidal Ideation:  Plan: Denies  Intent: Denies  Means: Denies  Homicidal Ideation:  Plan: Denies  Intent: Denies  Means: Denies  AEB (as evidenced by):  Psychiatric Specialty Exam:  Physical Exam  Constitutional: He is oriented to  person, place, and time. He appears well-developed.  HENT:  Head: Normocephalic.  Eyes: Pupils are equal, round, and reactive to light.  Neck: Normal range of motion.  Cardiovascular: Normal rate.  Respiratory: Effort normal and breath sounds normal.  GI: Soft.  Genitourinary:  Denies any problems in this area  Musculoskeletal: Normal range of motion.  Neurological: He is alert and oriented to person, place, and time.  Skin: Skin is warm and dry.  Psychiatric: His speech is normal and behavior is normal. His mood appears anxious. His affect is not angry, not blunt, not labile and not inappropriate. He exhibits a depressed mood.   Review of Systems  Constitutional: Negative HENT: Negative.  Eyes: Negative.  Respiratory: Negative.  Cardiovascular: Negative.  Gastrointestinal: Negative.   Genitourinary: Negative.  Musculoskeletal: Negative Skin: Negative.  Neurological: Negative  Endo/Heme/Allergies: Negative.  Psychiatric/Behavioral: Positive for memory loss and substance abuse (Alcoholism). Negative for depression, suicidal ideas and hallucinations. The patient is nervous/anxious (Currently on medication for stabilization) and has insomnia (Currently on medication for stabilization.).   Blood pressure 126/78, pulse 90, temperature 97.6 F (36.4 C), temperature source Oral, resp. rate 18, height 6' (1.829 m), weight 74.39 kg (164 lb), SpO2 95.00%.Body mass index is 22.24 kg/(m^2).   General Appearance: Casual and thin frame   Eye Contact:: Good   Speech: Clear and Coherent   Volume: Normal  Mood: Anxious   Affect: Congruent   Thought Process: Coherent and Intact   Orientation: Full (Time, Place, and Person)   Thought Content: Rumination   Suicidal Thoughts: No   Homicidal Thoughts: No   Memory: Immediate; Good  Recent; Good  Remote; Good   Judgement: Fair   Insight: Fair   Psychomotor Activity: Restlessness   Concentration: Fair   Recall: Good   Fund of Knowledge:Good    Language: Good   Akathisia: No   Handed: Right   AIMS (if indicated):   Assets: Desire for Improvement   Sleep: Number of Hours: 4   Musculoskeletal:  Strength & Muscle Tone: within normal limits  Gait & Station: normal  Patient leans: N/A  Current Medications:  Current Facility-Administered Medications   Medication  Dose  Route  Frequency  Provider  Last Rate  Last Dose   .  acetaminophen (TYLENOL) tablet 650 mg  650 mg  Oral  Q6H PRN  Laverle Hobby, PA-C   650 mg at 06/21/13 1721   .  alum & mag hydroxide-simeth (MAALOX/MYLANTA) 200-200-20 MG/5ML suspension 30 mL  30 mL  Oral  Q4H PRN  Laverle Hobby, PA-C     .  gabapentin (NEURONTIN) capsule 100 mg  100 mg  Oral  TID  Encarnacion Slates, NP   100 mg at 06/23/13 1130   .  insulin aspart (novoLOG) injection 0-15 Units  0-15 Units  Subcutaneous  TID WC  Laverle Hobby, PA-C   3 Units at 06/23/13 1156   .  insulin aspart (novoLOG) injection 0-5 Units  0-5 Units  Subcutaneous  QHS  Laverle Hobby, PA-C   2 Units at 06/22/13 2152   .  insulin glargine (LANTUS) injection 6 Units  6 Units  Subcutaneous  QHS  Laverle Hobby, PA-C   6 Units at 06/22/13 2152   .  lisinopril (PRINIVIL,ZESTRIL) tablet 10 mg  10 mg  Oral  Daily  Encarnacion Slates, NP   10 mg at 06/23/13 5631   .  LORazepam (ATIVAN) tablet 1 mg  1 mg  Oral  BID  Nicholaus Bloom, MD   1 mg at 06/23/13 4970   .  [START ON 06/24/2013] LORazepam (ATIVAN) tablet 1 mg  1 mg  Oral  q morning - 10a  Nicholaus Bloom, MD     .  LORazepam (ATIVAN) tablet 1 mg  1 mg  Oral  Q6H PRN  Nicholaus Bloom, MD   1 mg at 06/23/13 0350   .  magnesium hydroxide (MILK OF MAGNESIA) suspension 30 mL  30 mL  Oral  Daily PRN  Laverle Hobby, PA-C     .  metFORMIN (GLUCOPHAGE) tablet 500 mg  500 mg  Oral  BID WC  Laverle Hobby, PA-C   500 mg at 06/23/13 2637   .  multivitamin with minerals tablet 1 tablet  1 tablet  Oral  Daily  Laverle Hobby, PA-C   1 tablet at 06/23/13 8588   .  thiamine (B-1) injection 100 mg  100  mg  Intramuscular  Once  [employer], PA-C     .  thiamine (VITAMIN B-1) tablet 100 mg  100 mg  Oral  Daily  Laverle Hobby, PA-C   100 mg at 06/23/13 5027   .  traZODone (DESYREL) tablet 50 mg  50 mg  Oral  QHS,MR X 1  Laverle Hobby, PA-C  Lab Results:  Results for orders placed during the hospital encounter of 06/19/13 (from the past 48 hour(s))   GLUCOSE, CAPILLARY Status: Abnormal    Collection Time    06/21/13 5:14 PM   Result  Value  Ref Range    Glucose-Capillary  243 (*)  70 - 99 mg/dL    Comment 1  Notify RN    GLUCOSE, CAPILLARY Status: Abnormal    Collection Time    06/21/13 9:20 PM   Result  Value  Ref Range    Glucose-Capillary  232 (*)  70 - 99 mg/dL    Comment 1  Notify RN     Comment 2  Documented in Chart    GLUCOSE, CAPILLARY Status: Abnormal    Collection Time    06/22/13 6:18 AM   Result  Value  Ref Range    Glucose-Capillary  273 (*)  70 - 99 mg/dL    Comment 1  Notify RN    GLUCOSE, CAPILLARY Status: Abnormal    Collection Time    06/22/13 11:58 AM   Result  Value  Ref Range    Glucose-Capillary  263 (*)  70 - 99 mg/dL   GLUCOSE, CAPILLARY Status: Abnormal    Collection Time    06/22/13 4:55 PM   Result  Value  Ref Range    Glucose-Capillary  269 (*)  70 - 99 mg/dL   GLUCOSE, CAPILLARY Status: Abnormal    Collection Time    06/22/13 9:13 PM   Result  Value  Ref Range    Glucose-Capillary  234 (*)  70 - 99 mg/dL   GLUCOSE, CAPILLARY Status: Abnormal    Collection Time    06/23/13 6:16 AM   Result  Value  Ref Range    Glucose-Capillary  279 (*)  70 - 99 mg/dL    Comment 1  Notify RN    GLUCOSE, CAPILLARY Status: Abnormal    Collection Time    06/23/13 11:46 AM   Result  Value  Ref Range    Glucose-Capillary  176 (*)  70 - 99 mg/dL    Physical Findings:  AIMS: Facial and Oral Movements  Muscles of Facial Expression: None, normal  Lips and Perioral Area: None, normal  Jaw: None, normal  Tongue: None, normal,Extremity Movements   Upper (arms, wrists, hands, fingers): None, normal  Lower (legs, knees, ankles, toes): None, normal, Trunk Movements  Neck, shoulders, hips: None, normal, Overall Severity  Severity of abnormal movements (highest score from questions above): None, normal  Incapacitation due to abnormal movements: None, normal  Patient's awareness of abnormal movements (rate only patient's report): No Awareness, Dental Status  Current problems with teeth and/or dentures?: No  Does patient usually wear dentures?: No  CIWA: CIWA-Ar Total: 1  COWS: COWS Total Score: 5  Treatment Plan Summary:  Daily contact with patient to assess and evaluate symptoms and progress in treatment  Medication management  Plan: Review of chart, vital signs, medications, and notes.  1-Individual and group therapy  2-Medication management for depression and anxiety:  Continue Neurontin 100 mg three time daily for substance withdrawal syndrome. Continue Ativan taper for alcohol detox.  Medications reviewed with the patient, denies any adverse effects.  3-Coping skills for depression, anxiety, and substance dependency  4-Continue crisis stabilization and management (detox treatment).  5-Address health issues--monitoring vital signs, stable. CBG readings elevated. Increase Novolog 6 units TID WC. Continue Novolog SSI, Metformin 500 mg BID.   6-Treatment plan in progress to prevent  relapse of depression, substance abuse, and anxiety  7-Anticipate discharge tomorrow morning.   Medical Decision Making  Problem Points: Established problem, stable/improving (1), Review of last therapy session (1) and Review of psycho-social stressors (1)  Data Points: Review of medication regiment & side effects (2)  Review of new medications or change in dosage (2)  I certify that inpatient services furnished can reasonably be expected to improve the patient's condition.  Elmarie Shiley, NP-C  06/24/2013, 12:18 PM   Patient seen, evaluated and I agree  with notes by Nurse Practitioner. Corena Pilgrim, MD

## 2013-06-25 LAB — GLUCOSE, CAPILLARY
GLUCOSE-CAPILLARY: 233 mg/dL — AB (ref 70–99)
GLUCOSE-CAPILLARY: 237 mg/dL — AB (ref 70–99)

## 2013-06-25 NOTE — Progress Notes (Signed)
Patient observed in bed with eyes closed, respirations even and unlabored. Appears to be resting comfortably.  Safety maintained, Q 15 checks continue.

## 2013-06-25 NOTE — Progress Notes (Signed)
Patient ID: [name], male   DOB: [birth year], 58 y.o.   MRN: [id number] He has been discharged home and was going to be picked up by a friend and go to work. He voiced understanding of discharge instruction and of follow up plan. He denies thoughts of SI and all his belongings were taken home with her.  He stated" I am going to take care of myself and not worry about others who bring me down".

## 2013-06-25 NOTE — Progress Notes (Signed)
[facility] Adult Case Management Discharge Plan :  Will you be returning to the same living situation after discharge: yes, [place] At discharge, do you have transportation home?:Yes,  bus Do you have the ability to pay for your medications:Yes,  mental health  Release of information consent forms completed and submitted to Medical Records by CSW.   Patient to Follow up at: Follow-up Information   Follow up with Pt refused follow-up. .      Patient denies SI/HI:   Yes,  during admission, self report.     Safety Planning and Suicide Prevention discussed:  Yes,  SPE not required for this pt as he did not endorse SI during admission or during stay. SPI pamphlet provided to pt and he was encouraged to share information with support network, ask questions, and talk about any concerns relating to SPE.  Smart, HeatherLCSWA  06/25/2013, 9:20 AM

## 2013-06-26 ENCOUNTER — Emergency Department (HOSPITAL_COMMUNITY)
Admission: EM | Admit: 2013-06-26 | Discharge: 2013-06-26 | Disposition: A | Discharge status: Home / Self Care | Payer: Self-pay | Attending: Emergency Medicine | Admitting: Emergency Medicine

## 2013-06-26 ENCOUNTER — Encounter (HOSPITAL_COMMUNITY): Payer: Self-pay | Admitting: Emergency Medicine

## 2013-06-26 DIAGNOSIS — E119 Type 2 diabetes mellitus without complications: Secondary | ICD-10-CM | POA: Insufficient documentation

## 2013-06-26 DIAGNOSIS — F17200 Nicotine dependence, unspecified, uncomplicated: Secondary | ICD-10-CM | POA: Insufficient documentation

## 2013-06-26 DIAGNOSIS — Z8719 Personal history of other diseases of the digestive system: Secondary | ICD-10-CM | POA: Insufficient documentation

## 2013-06-26 DIAGNOSIS — R739 Hyperglycemia, unspecified: Secondary | ICD-10-CM

## 2013-06-26 DIAGNOSIS — Z79899 Other long term (current) drug therapy: Secondary | ICD-10-CM | POA: Insufficient documentation

## 2013-06-26 DIAGNOSIS — Z862 Personal history of diseases of the blood and blood-forming organs and certain disorders involving the immune mechanism: Secondary | ICD-10-CM | POA: Insufficient documentation

## 2013-06-26 DIAGNOSIS — Z8619 Personal history of other infectious and parasitic diseases: Secondary | ICD-10-CM | POA: Insufficient documentation

## 2013-06-26 LAB — BASIC METABOLIC PANEL
BUN: 15 mg/dL (ref 6–23)
CALCIUM: 8.6 mg/dL (ref 8.4–10.5)
CO2: 26 mEq/L (ref 19–32)
CREATININE: 0.48 mg/dL — AB (ref 0.50–1.35)
Chloride: 97 mEq/L (ref 96–112)
GFR calc Af Amer: >90 mL/min (ref >90)
GLUCOSE: 324 mg/dL — AB (ref 70–99)
Potassium: 4.1 mEq/L (ref 3.7–5.3)
SODIUM: 134 meq/L — AB (ref 137–147)

## 2013-06-26 LAB — POCT I-STAT, CHEM 8
BUN: 23 mg/dL (ref 6–23)
CALCIUM ION: 1.09 mmol/L — AB (ref 1.12–1.23)
Chloride: 95 mEq/L — ABNORMAL LOW (ref 96–112)
Creatinine, Ser: 0.6 mg/dL (ref 0.50–1.35)
GLUCOSE: 379 mg/dL — AB (ref 70–99)
HCT: 41 % (ref 39.0–52.0)
Hemoglobin: 13.9 g/dL (ref 13.0–17.0)
Potassium: 5.9 mEq/L — ABNORMAL HIGH (ref 3.7–5.3)
Sodium: 131 mEq/L — ABNORMAL LOW (ref 137–147)
TCO2: 28 mmol/L (ref 0–100)

## 2013-06-26 LAB — GLUCOSE, CAPILLARY
Glucose-Capillary: 375 mg/dL — ABNORMAL HIGH (ref 70–99)
Glucose-Capillary: 261 mg/dL — ABNORMAL HIGH (ref 70–99)

## 2013-06-26 MED ORDER — INSULIN ASPART 100 UNIT/ML ~~LOC~~ SOLN
8.0000 [IU] | Freq: Once | SUBCUTANEOUS | Status: AC
  Administered 2013-06-26: 8 [IU] via INTRAVENOUS
  Filled 2013-06-26 (×2): qty 1

## 2013-06-26 MED ORDER — SODIUM CHLORIDE 0.9 % IV BOLUS (SEPSIS)
1000.0000 mL | Freq: Once | INTRAVENOUS | Status: AC
  Administered 2013-06-26: 1000 mL via INTRAVENOUS

## 2013-06-26 NOTE — ED Notes (Addendum)
Could not get his insulin from his friends house last night due to weather feels like his sugar is up was just at [facility] for detox last time he took his meds was yesterday right before release

## 2013-06-26 NOTE — Discharge Instructions (Signed)
Take your medications.  Hyperglycemia Hyperglycemia occurs when the glucose (sugar) in your blood is too high. Hyperglycemia can happen for many reasons, but it most often happens to people who do not know they have diabetes or are not managing their diabetes properly.  CAUSES  Whether you have diabetes or not, there are other causes of hyperglycemia. Hyperglycemia can occur when you have diabetes, but it can also occur in other situations that you might not be as aware of, such as: Diabetes  If you have diabetes and are having problems controlling your blood glucose, hyperglycemia could occur because of some of the following reasons:  Not following your meal plan.  Not taking your diabetes medications or not taking it properly.  Exercising less or doing less activity than you normally do.  Being sick. Pre-diabetes  This cannot be ignored. Before people develop Type 2 diabetes, they almost always have "pre-diabetes." This is when your blood glucose levels are higher than normal, but not yet high enough to be diagnosed as diabetes. Research has shown that some long-term damage to the body, especially the heart and circulatory system, may already be occurring during pre-diabetes. If you take action to manage your blood glucose when you have pre-diabetes, you may delay or prevent Type 2 diabetes from developing. Stress  If you have diabetes, you may be "diet" controlled or on oral medications or insulin to control your diabetes. However, you may find that your blood glucose is higher than usual in the hospital whether you have diabetes or not. This is often referred to as "stress hyperglycemia." Stress can elevate your blood glucose. This happens because of hormones put out by the body during times of stress. If stress has been the cause of your high blood glucose, it can be followed regularly by your caregiver. That way he/she can make sure your hyperglycemia does not continue to get worse or  progress to diabetes. Steroids  Steroids are medications that act on the infection fighting system (immune system) to block inflammation or infection. One side effect can be a rise in blood glucose. Most people can produce enough extra insulin to allow for this rise, but for those who cannot, steroids make blood glucose levels go even higher. It is not unusual for steroid treatments to "uncover" diabetes that is developing. It is not always possible to determine if the hyperglycemia will go away after the steroids are stopped. A special blood test called an A1c is sometimes done to determine if your blood glucose was elevated before the steroids were started. SYMPTOMS  Thirsty.  Frequent urination.  Dry mouth.  Blurred vision.  Tired or fatigue.  Weakness.  Sleepy.  Tingling in feet or leg. DIAGNOSIS  Diagnosis is made by monitoring blood glucose in one or all of the following ways:  A1c test. This is a chemical found in your blood.  Fingerstick blood glucose monitoring.  Laboratory results. TREATMENT  First, knowing the cause of the hyperglycemia is important before the hyperglycemia can be treated. Treatment may include, but is not be limited to:  Education.  Change or adjustment in medications.  Change or adjustment in meal plan.  Treatment for an illness, infection, etc.  More frequent blood glucose monitoring.  Change in exercise plan.  Decreasing or stopping steroids.  Lifestyle changes. HOME CARE INSTRUCTIONS   Test your blood glucose as directed.  Exercise regularly. Your caregiver will give you instructions about exercise. Pre-diabetes or diabetes which comes on with stress is helped by  exercising.  Eat wholesome, balanced meals. Eat often and at regular, fixed times. Your caregiver or nutritionist will give you a meal plan to guide your sugar intake.  Being at an ideal weight is important. If needed, losing as little as 10 to 15 pounds may help improve  blood glucose levels. SEEK MEDICAL CARE IF:   You have questions about medicine, activity, or diet.  You continue to have symptoms (problems such as increased thirst, urination, or weight gain). SEEK IMMEDIATE MEDICAL CARE IF:   You are vomiting or have diarrhea.  Your breath smells fruity.  You are breathing faster or slower.  You are very sleepy or incoherent.  You have numbness, tingling, or pain in your feet or hands.  You have chest pain.  Your symptoms get worse even though you have been following your caregiver's orders.  If you have any other questions or concerns. Document Released: 10/20/2000 Document Revised: 07/19/2011 Document Reviewed: 08/23/2011 [facility] Patient Information 2014 [place], [place].

## 2013-06-26 NOTE — Discharge Summary (Signed)
Physician Discharge Summary Note  Patient:  [name] is an 58 y.o., male MRN:  [id number] DOB:  10-27-1955 Patient phone:  [phone] (home)  Patient address:   [address] [place] [address],  Total Time spent with patient: 20 minutes  Date of Admission:  06/19/2013 Date of Discharge: 06/25/13  Reason for Admission:  Depression, Alcohol abuse   Discharge Diagnoses: Active Problems:   Alcohol dependence   Substance induced mood disorder   Psychiatric Specialty Exam: Physical Exam  Review of Systems  Constitutional: Negative.   HENT: Negative.   Eyes: Negative.   Respiratory: Negative.   Cardiovascular: Negative.   Gastrointestinal: Negative.   Genitourinary: Negative.   Musculoskeletal: Negative.   Skin: Negative.   Neurological: Negative.   Endo/Heme/Allergies: Negative.   Psychiatric/Behavioral: Positive for substance abuse. Negative for depression, suicidal ideas, hallucinations and memory loss. The patient is not nervous/anxious and does not have insomnia.     Blood pressure 123/80, pulse 99, temperature 97.9 F (36.6 C), temperature source Oral, resp. rate 20, height 6' (1.829 m), weight 74.39 kg (164 lb), SpO2 95.00%.Body mass index is 22.24 kg/(m^2).  General Appearance: Fairly Groomed  Engineer, water::  Good  Speech:  Clear and Coherent  Volume:  Normal  Mood:  Euthymic  Affect:  Appropriate  Thought Process:  Goal Directed and Linear  Orientation:  Full (Time, Place, and Person)  Thought Content:  Negative  Suicidal Thoughts:  No  Homicidal Thoughts:  No  Memory:  Immediate;   Fair Recent;   Fair Remote;   Fair  Judgement:  Fair  Insight:  Fair  Psychomotor Activity:  Normal  Concentration:  Fair  Recall:  [employer] of Knowledge:Good  Language: Fair  Akathisia:  No  Handed:  Right  AIMS (if indicated):     Assets:  Communication Skills Desire for Improvement  Sleep:  Number of Hours: 4    Past Psychiatric History: Diagnosis:  Alcohol abuse   Hospitalizations: [facility]  Outpatient Care: Did not follow up   Substance Abuse Care: [facility], [facility], [facility]  Self-Mutilation: Denies   Suicidal Attempts: Denies   Violent Behaviors: Denies    Musculoskeletal: Strength & Muscle Tone: within normal limits Gait & Station: normal Patient leans: Right  DSM5: AXIS I: Alcohol dependence  Substance induced mood disorder  AXIS II: Cluster B Traits  AXIS III:  Past Medical History   Diagnosis  Date   .  Hepatitis C    .  Diabetes mellitus    .  Chronic hyponatremia      pt denies having   .  Alcoholic    .  Alcoholic cirrhosis of liver      Pt calls this a false diagnosis. Pt denies   .  Thrombocytopenia    .  Hyperglycemia     AXIS IV: other psychosocial or environmental problems and problems related to social environment  AXIS V: 61-70 mild symptoms  Level of Care:  OP  Hospital Course:  [name] is a 58 year old male with alcohol dependence on one of multiple admissions to our unit. "It has been 6 months now doctor" relapsed around Christmas. States he had a disruption of his routine. Work slowed down, had too much time on his hands. He has run trough all his savings. He started drinking and it escalated. Usually 12 pack a day, yesterday a case. 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 She had to be asked to leave. This was very stressful as she had to be kicked out to the street. States she was very needy demanding of his time. States before it got as bad as it usually gets he decided to get help.  Patient was admitted to the [address] hallway for alcohol detox. He completed an ativan taper to achieve detox. The patient showed insight into his reasons for relapsing. Patient was started on Neurontin 100 mg TID for improved mood stability and alcohol withdrawal. He was given Metformin and Lantus insulin for his diagnosis of Diabetes Mellitus with his blood sugars being  monitored daily. Patient after being detoxed was anxious to get back to work with his employer. He declined to have any follow up set up by Education officer, museum. Patient attended some groups but often stayed in his room. The patient was pleasant during interactions and talked openly about his long standing struggle with alcohol abuse. He was encouraged to follow up with Primary Care MD for further management of medical problems.   Consults:  None  Significant Diagnostic Studies:  Admission labs reviewed and completed   Discharge Vitals:   Blood pressure 123/80, pulse 99, temperature 97.9 F (36.6 C), temperature source Oral, resp. rate 20, height 6' (1.829 m), weight 74.39 kg (164 lb), SpO2 95.00%. Body mass index is 22.24 kg/(m^2). Lab Results:   Results for orders placed during the hospital encounter of 06/19/13 (from the past 72 hour(s))  GLUCOSE, CAPILLARY     Status: Abnormal   Collection Time    06/23/13 11:46 AM      Result Value Ref Range   Glucose-Capillary 176 (*) 70 - 99 mg/dL  GLUCOSE, CAPILLARY     Status: Abnormal   Collection Time    06/23/13  5:28 PM      Result Value Ref Range   Glucose-Capillary 387 (*) 70 - 99 mg/dL  GLUCOSE, CAPILLARY     Status: Abnormal   Collection Time    06/23/13  9:10 PM      Result Value Ref Range   Glucose-Capillary 107 (*) 70 - 99 mg/dL   Comment 1 Notify RN     Comment 2 Documented in Chart    GLUCOSE, CAPILLARY     Status: Abnormal   Collection Time    06/24/13  6:03 AM      Result Value Ref Range   Glucose-Capillary 309 (*) 70 - 99 mg/dL   Comment 1 Notify RN    GLUCOSE, CAPILLARY     Status: Abnormal   Collection Time    06/24/13 11:24 AM      Result Value Ref Range   Glucose-Capillary 227 (*) 70 - 99 mg/dL   Comment 1 Notify RN    GLUCOSE, CAPILLARY     Status: Abnormal   Collection Time    06/24/13  5:22 PM      Result Value Ref Range   Glucose-Capillary 234 (*) 70 - 99 mg/dL  GLUCOSE, CAPILLARY     Status: Abnormal    Collection Time    06/24/13  9:17 PM      Result Value Ref Range   Glucose-Capillary 233 (*) 70 - 99 mg/dL  GLUCOSE, CAPILLARY     Status: Abnormal   Collection Time    06/25/13  6:04 AM      Result Value Ref Range   Glucose-Capillary 237 (*) 70 - 99 mg/dL    Physical Findings: AIMS: Facial and Oral Movements Muscles  of Facial Expression: None, normal Lips and Perioral Area: None, normal Jaw: None, normal Tongue: None, normal,Extremity Movements Upper (arms, wrists, hands, fingers): None, normal Lower (legs, knees, ankles, toes): None, normal, Trunk Movements Neck, shoulders, hips: None, normal, Overall Severity Severity of abnormal movements (highest score from questions above): None, normal Incapacitation due to abnormal movements: None, normal Patient's awareness of abnormal movements (rate only patient's report): No Awareness, Dental Status Current problems with teeth and/or dentures?: No Does patient usually wear dentures?: No  CIWA:  CIWA-Ar Total: 2 COWS:  COWS Total Score: 2  Psychiatric Specialty Exam: See Psychiatric Specialty Exam and Suicide Risk Assessment completed by Attending Physician prior to discharge.  Discharge destination:  Home  Is patient on multiple antipsychotic therapies at discharge:  No   Has Patient had three or more failed trials of antipsychotic monotherapy by history:  No  Recommended Plan for Multiple Antipsychotic Therapies: NA  Discharge Orders   Future Orders Complete By Expires   Discharge instructions  As directed    Comments:     Please follow up with your Primary Care Provider for further management of medical problems such as Type 2 diabetes.       Medication List       Indication   gabapentin 100 MG capsule  Commonly known as:  NEURONTIN  Take 1 capsule (100 mg total) by mouth 3 (three) times daily.   Indication:  Agitation, Alcohol Withdrawal Syndrome, Pain     lisinopril 10 MG tablet  Commonly known as:   PRINIVIL,ZESTRIL  Take 1 tablet (10 mg total) by mouth daily.   Indication:  High Blood Pressure     metFORMIN 500 MG tablet  Commonly known as:  GLUCOPHAGE  Take 1 tablet (500 mg total) by mouth 2 (two) times daily with a meal.   Indication:  Type 2 Diabetes     traZODone 50 MG tablet  Commonly known as:  DESYREL  Take 1 tablet (50 mg total) by mouth at bedtime and may repeat dose one time if needed.   Indication:  Trouble Sleeping           Follow-up Information   Follow up with Pt refused follow-up. .      Follow-up recommendations:   Activity: as tolerated  Diet: healthy  Tests: routine blood work up  Other: patient to keep his after care appointment   Comments:   Take all your medications as prescribed by your mental healthcare provider.  Report any adverse effects and or reactions from your medicines to your outpatient provider promptly.  Patient is instructed and cautioned to not engage in alcohol and or illegal drug use while on prescription medicines.  In the event of worsening symptoms, patient is instructed to call the crisis hotline, 911 and or go to the nearest ED for appropriate evaluation and treatment of symptoms.  Follow-up with your primary care provider for your other medical issues, concerns and or health care needs.   Total Discharge Time:  Greater than 30 minutes.  SignedElmarie Shiley NP-C 06/26/2013, 8:44 AM  Patient seen, evaluated and I agree with notes by Nurse Practitioner. Corena Pilgrim, MD

## 2013-06-26 NOTE — ED Provider Notes (Signed)
CSN: [id number]     Arrival date & time 06/26/13  1024 History   First MD Initiated Contact with Patient 06/26/13 1031     Chief Complaint  Patient presents with  . Medication Refill     (Consider location/radiation/quality/duration/timing/severity/associated sxs/prior Treatment) The history is provided by the patient.   patient presents with hyperglycemia. He states he just cannot behavioral health. He states he was getting shots of insulin I was there. He states that he was given a insulin pen to go home with but was only take it if he needs it occasionally. 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 He states he has not had his metformin for 2 doses. He states his been urinating frequently. His been. No fevers. No trauma.  Past Medical History  Diagnosis Date  . Hepatitis C   . Diabetes mellitus   . Chronic hyponatremia     pt denies having   . Alcoholic   . Alcoholic cirrhosis of liver     Pt calls this a false diagnosis. Pt denies  . Thrombocytopenia   . Hyperglycemia    Past Surgical History  Procedure Laterality Date  . Plate in lower right leg  2008   Family History  Problem Relation Age of Onset  . ALS Father    History  Substance Use Topics  . Smoking status: Current Every Day Smoker -- 1.00 packs/day for 40 years    Types: Cigarettes  . Smokeless tobacco: Never Used  . Alcohol Use: 9.0 oz/week    15 Cans of beer per week     Comment: 18/daily    Review of Systems  Constitutional: Negative for activity change and appetite change.  Eyes: Negative for pain.  Respiratory: Negative for chest tightness and shortness of breath.   Cardiovascular: Negative for chest pain and leg swelling.  Gastrointestinal: Negative for nausea, vomiting, abdominal pain and diarrhea.  Endocrine: Positive for polydipsia and polyuria.  Genitourinary: Negative for flank pain.  Musculoskeletal: Negative for back  pain and neck stiffness.  Skin: Negative for rash.  Neurological: Negative for weakness, numbness and headaches.  Psychiatric/Behavioral: Negative for behavioral problems.      Allergies  Benadryl; Librium; and Sulfa antibiotics  Home Medications   Current Outpatient Rx  Name  Route  Sig  Dispense  Refill  . gabapentin (NEURONTIN) 100 MG capsule   Oral   Take 1 capsule (100 mg total) by mouth 3 (three) times daily.   90 capsule   0   . metFORMIN (GLUCOPHAGE) 500 MG tablet   Oral   Take 1 tablet (500 mg total) by mouth 2 (two) times daily with a meal.         . Multiple Vitamins-Minerals (MULTIVITAMIN WITH MINERALS) tablet   Oral   Take 1 tablet by mouth daily.          BP 160/84  Pulse 73  Temp(Src) 97.6 F (36.4 C)  Resp 20  Wt 165 lb (74.844 kg)  SpO2 99% Physical Exam  Nursing note and vitals reviewed. Constitutional: He is oriented to person, place, and time. He appears well-developed and well-nourished.  HENT:  Head: Normocephalic and atraumatic.  Eyes: EOM are normal. Pupils are equal, round, and reactive to light.  Neck: Normal range of motion. Neck supple.  Cardiovascular: Normal rate, regular rhythm and normal heart sounds.   No murmur heard. Pulmonary/Chest: Effort normal and  breath sounds normal.  Abdominal: Soft. Bowel sounds are normal. He exhibits no distension and no mass. There is no tenderness. There is no rebound and no guarding.  Musculoskeletal: Normal range of motion. He exhibits no edema.  Neurological: He is alert and oriented to person, place, and time. No cranial nerve deficit.  Skin: Skin is warm and dry.  Psychiatric: He has a normal mood and affect.    ED Course  Procedures (including critical care time) Labs Review Labs Reviewed  GLUCOSE, CAPILLARY - Abnormal; Notable for the following:    Glucose-Capillary 375 (*)    All other components within normal limits  BASIC METABOLIC PANEL - Abnormal; Notable for the following:     Sodium 134 (*)    Glucose, Bld 324 (*)    Creatinine, Ser 0.48 (*)    All other components within normal limits  GLUCOSE, CAPILLARY - Abnormal; Notable for the following:    Glucose-Capillary 261 (*)    All other components within normal limits  POCT I-STAT, CHEM 8 - Abnormal; Notable for the following:    Sodium 131 (*)    Potassium 5.9 (*)    Chloride 95 (*)    Glucose, Bld 379 (*)    Calcium, Ion 1.09 (*)    All other components within normal limits   Imaging Review No results found.  EKG Interpretation   None       MDM   Final diagnoses:  Hyperglycemia    Patient with hyperglycemia without DKA. Initial i-STAT showed a false elevated potassium. Patient's sugars improved and will be discharged home. He states he now has access to his medications.    Jasper Riling. Alvino Chapel, MD 06/26/13 1327

## 2013-06-28 NOTE — Progress Notes (Signed)
Patient Discharge Instructions:  No documentation was faxed for HBIPS.  Per the SW the patient refused follow up.  Patsey Berthold, 06/28/2013, 3:02 PM

## 2013-08-24 ENCOUNTER — Emergency Department (HOSPITAL_COMMUNITY)
Admission: EM | Admit: 2013-08-24 | Discharge: 2013-08-24 | Disposition: A | Discharge status: Home / Self Care | Payer: Self-pay | Attending: Emergency Medicine | Admitting: Emergency Medicine

## 2013-08-24 ENCOUNTER — Encounter (HOSPITAL_COMMUNITY): Payer: Self-pay | Admitting: Emergency Medicine

## 2013-08-24 DIAGNOSIS — Z862 Personal history of diseases of the blood and blood-forming organs and certain disorders involving the immune mechanism: Secondary | ICD-10-CM | POA: Insufficient documentation

## 2013-08-24 DIAGNOSIS — E119 Type 2 diabetes mellitus without complications: Secondary | ICD-10-CM | POA: Insufficient documentation

## 2013-08-24 DIAGNOSIS — F1210 Cannabis abuse, uncomplicated: Secondary | ICD-10-CM | POA: Insufficient documentation

## 2013-08-24 DIAGNOSIS — R110 Nausea: Secondary | ICD-10-CM | POA: Insufficient documentation

## 2013-08-24 DIAGNOSIS — F1410 Cocaine abuse, uncomplicated: Secondary | ICD-10-CM | POA: Insufficient documentation

## 2013-08-24 DIAGNOSIS — Z79899 Other long term (current) drug therapy: Secondary | ICD-10-CM | POA: Insufficient documentation

## 2013-08-24 DIAGNOSIS — F1010 Alcohol abuse, uncomplicated: Secondary | ICD-10-CM

## 2013-08-24 DIAGNOSIS — Z8619 Personal history of other infectious and parasitic diseases: Secondary | ICD-10-CM | POA: Insufficient documentation

## 2013-08-24 DIAGNOSIS — R5383 Other fatigue: Secondary | ICD-10-CM

## 2013-08-24 DIAGNOSIS — R5381 Other malaise: Secondary | ICD-10-CM | POA: Insufficient documentation

## 2013-08-24 DIAGNOSIS — F10229 Alcohol dependence with intoxication, unspecified: Secondary | ICD-10-CM | POA: Diagnosis present

## 2013-08-24 DIAGNOSIS — F1020 Alcohol dependence, uncomplicated: Secondary | ICD-10-CM | POA: Insufficient documentation

## 2013-08-24 DIAGNOSIS — F17200 Nicotine dependence, unspecified, uncomplicated: Secondary | ICD-10-CM | POA: Insufficient documentation

## 2013-08-24 LAB — COMPREHENSIVE METABOLIC PANEL
ALBUMIN: 3.3 g/dL — AB (ref 3.5–5.2)
ALT: 35 U/L (ref 0–53)
AST: 45 U/L — AB (ref 0–37)
Alkaline Phosphatase: 121 U/L — ABNORMAL HIGH (ref 39–117)
BILIRUBIN TOTAL: 0.6 mg/dL (ref 0.3–1.2)
BUN: 7 mg/dL (ref 6–23)
CHLORIDE: 91 meq/L — AB (ref 96–112)
CO2: 22 mEq/L (ref 19–32)
CREATININE: 0.43 mg/dL — AB (ref 0.50–1.35)
Calcium: 9 mg/dL (ref 8.4–10.5)
GFR calc Af Amer: >90 mL/min (ref >90)
GFR calc non Af Amer: >90 mL/min (ref >90)
Glucose, Bld: 380 mg/dL — ABNORMAL HIGH (ref 70–99)
POTASSIUM: 4.5 meq/L (ref 3.7–5.3)
SODIUM: 130 meq/L — AB (ref 137–147)
Total Protein: 7.8 g/dL (ref 6.0–8.3)

## 2013-08-24 LAB — CBC WITH DIFFERENTIAL/PLATELET
BASOS ABS: 0.1 10*3/uL (ref 0.0–0.1)
BASOS PCT: 1 % (ref 0–1)
Eosinophils Absolute: 0.7 10*3/uL (ref 0.0–0.7)
Eosinophils Relative: 7 % — ABNORMAL HIGH (ref 0–5)
HCT: 40.8 % (ref 39.0–52.0)
Hemoglobin: 14.9 g/dL (ref 13.0–17.0)
LYMPHS PCT: 22 % (ref 12–46)
Lymphs Abs: 2.4 10*3/uL (ref 0.7–4.0)
MCH: 35.4 pg — ABNORMAL HIGH (ref 26.0–34.0)
MCHC: 36.5 g/dL — AB (ref 30.0–36.0)
MCV: 96.9 fL (ref 78.0–100.0)
MONO ABS: 0.4 10*3/uL (ref 0.1–1.0)
Monocytes Relative: 4 % (ref 3–12)
NEUTROS PCT: 67 % (ref 43–77)
Neutro Abs: 7.4 10*3/uL (ref 1.7–7.7)
PLATELETS: 96 10*3/uL — AB (ref 150–400)
RBC: 4.21 MIL/uL — ABNORMAL LOW (ref 4.22–5.81)
RDW: 13.6 % (ref 11.5–15.5)
WBC: 10.9 10*3/uL — ABNORMAL HIGH (ref 4.0–10.5)

## 2013-08-24 LAB — RAPID URINE DRUG SCREEN, HOSP PERFORMED
AMPHETAMINES: NOT DETECTED
Barbiturates: NOT DETECTED
Benzodiazepines: NOT DETECTED
COCAINE: POSITIVE — AB
Opiates: NOT DETECTED
Tetrahydrocannabinol: POSITIVE — AB

## 2013-08-24 LAB — ACETAMINOPHEN LEVEL: Acetaminophen (Tylenol), Serum: <15 ug/mL (ref 10–30)

## 2013-08-24 LAB — SALICYLATE LEVEL

## 2013-08-24 MED ORDER — LORAZEPAM 1 MG PO TABS: 1.0000 mg | ORAL_TABLET | ORAL | Status: DC | PRN

## 2013-08-24 MED ORDER — LORAZEPAM 1 MG PO TABS: 0.0000 mg | ORAL_TABLET | Freq: Four times a day (QID) | ORAL | Status: DC

## 2013-08-24 MED ORDER — LORAZEPAM 1 MG PO TABS
2.0000 mg | ORAL_TABLET | Freq: Once | ORAL | Status: AC
  Administered 2013-08-24: 2 mg via ORAL
  Filled 2013-08-24: qty 2

## 2013-08-24 MED ORDER — LORAZEPAM 1 MG PO TABS: 0.0000 mg | ORAL_TABLET | Freq: Two times a day (BID) | ORAL | Status: DC

## 2013-08-24 MED ORDER — VITAMIN B-1 100 MG PO TABS
100.0000 mg | ORAL_TABLET | Freq: Every day | ORAL | Status: DC
  Administered 2013-08-24: 100 mg via ORAL
  Filled 2013-08-24: qty 1

## 2013-08-24 MED ORDER — SODIUM CHLORIDE 0.9 % IV BOLUS (SEPSIS)
2000.0000 mL | Freq: Once | INTRAVENOUS | Status: AC
  Administered 2013-08-24: 2000 mL via INTRAVENOUS

## 2013-08-24 MED ORDER — LORAZEPAM 1 MG PO TABS: 1.0000 mg | ORAL_TABLET | Freq: Two times a day (BID) | ORAL | Status: DC

## 2013-08-24 MED ORDER — THIAMINE HCL 100 MG/ML IJ SOLN: 100.0000 mg | Freq: Every day | INTRAMUSCULAR | Status: DC

## 2013-08-24 MED ORDER — ONDANSETRON 4 MG PO TBDP
4.0000 mg | ORAL_TABLET | Freq: Once | ORAL | Status: AC
  Administered 2013-08-24: 4 mg via ORAL
  Filled 2013-08-24: qty 1

## 2013-08-24 MED ORDER — THIAMINE HCL 100 MG/ML IJ SOLN
Freq: Once | INTRAVENOUS | Status: AC
  Administered 2013-08-24: 17:00:00 via INTRAVENOUS
  Filled 2013-08-24: qty 1000

## 2013-08-24 NOTE — ED Notes (Signed)
Bed: [facility] Expected date:  Expected time:  Means of arrival:  Comments: TR8

## 2013-08-24 NOTE — Progress Notes (Signed)
Patient can discharge after he is finished receiving his IV fluids unless he changes his mind and wants to be admitted for alcohol detox.  Rx in place for limited amount of Ativan with specific instructions to taper.

## 2013-08-24 NOTE — ED Provider Notes (Signed)
CSN: [id number]     Arrival date & time 08/24/13  1224 History  This chart was scribed for non-physician practitioner, Alvina Chou, PA-C, working with Orlie Dakin, MD by Ladene Artist, ED Scribe. This patient was seen in room [place] and the patient's care was started at 1:33 PM.    Chief Complaint  Patient presents with  . detox    The history is provided by the patient. No language interpreter was used.   HPI Comments: [name] is a 58 y.o. male who presents to the Emergency Department for detox. Pt reports daily, heavily drinking over the past month. Pt drinks iced and draft beers. Pt states that he used cocaine last night. He also reports occassional marijuana use. Pt reports associated nausea and extreme fatigue. He shares his desire for alcohol treatment after realizing that he needed hep this morning. He denies SI and HI. Pt also denies abdominal pain.   Past Medical History  Diagnosis Date  . Hepatitis C   . Diabetes mellitus   . Chronic hyponatremia     pt denies having   . Alcoholic   . Alcoholic cirrhosis of liver     Pt calls this a false diagnosis. Pt denies  . Thrombocytopenia   . Hyperglycemia    Past Surgical History  Procedure Laterality Date  . Plate in lower right leg  2008   Family History  Problem Relation Age of Onset  . ALS Father    History  Substance Use Topics  . Smoking status: Current Every Day Smoker -- 1.00 packs/day for 40 years    Types: Cigarettes  . Smokeless tobacco: Never Used  . Alcohol Use: 9.0 oz/week    15 Cans of beer per week     Comment: 18/daily    Review of Systems  Constitutional: Positive for fatigue.  Gastrointestinal: Positive for nausea. Negative for abdominal pain.  Psychiatric/Behavioral: Negative for suicidal ideas.  All other systems reviewed and are negative.   Allergies  Benadryl; Librium; and Sulfa antibiotics  Home Medications   Prior to Admission medications   Medication Sig Start  Date End Date Taking? Authorizing Provider  metFORMIN (GLUCOPHAGE) 500 MG tablet Take 1 tablet (500 mg total) by mouth 2 (two) times daily with a meal. 06/24/13  Yes Elmarie Shiley, NP   BP 133/81  Pulse 92  Temp(Src) 98.3 F (36.8 C) (Oral)  Resp 18  SpO2 94% Physical Exam  Nursing note and vitals reviewed. Constitutional: He is oriented to person, place, and time. He appears well-developed and well-nourished. No distress.  HENT:  Head: Normocephalic and atraumatic.  Eyes: EOM are normal.  Neck: Neck supple. No tracheal deviation present.  Cardiovascular: Normal rate.   Pulmonary/Chest: Effort normal. No respiratory distress.  Abdominal: There is no tenderness.  Musculoskeletal: Normal range of motion.  Neurological: He is alert and oriented to person, place, and time.  Skin: Skin is warm and dry.  Psychiatric: He has a normal mood and affect. His behavior is normal.    ED Course  Procedures (including critical care time) DIAGNOSTIC STUDIES: Oxygen Saturation is 94% on RA, adequate by my interpretation.    COORDINATION OF CARE: 1:37 PM-Discussed treatment plan with pt at bedside and pt agreed to plan.   Labs Review Labs Reviewed  CBC WITH DIFFERENTIAL - Abnormal; Notable for the following:    WBC 10.9 (*)    RBC 4.21 (*)    MCH 35.4 (*)    MCHC 36.5 (*)  Platelets 96 (*)    Eosinophils Relative 7 (*)    All other components within normal limits  COMPREHENSIVE METABOLIC PANEL  URINE RAPID DRUG SCREEN (HOSP PERFORMED)  ACETAMINOPHEN LEVEL  SALICYLATE LEVEL    Imaging Review No results found.   EKG Interpretation None      MDM   Final diagnoses:  Alcohol dependence with acute alcoholic intoxication  Alcohol dependence  Alcohol abuse     I personally performed the services described in this documentation, which was scribed in my presence. The recorded information has been reviewed and is accurate.    Alvina Chou, [place] 09/03/13 [phone]

## 2013-08-24 NOTE — Discharge Instructions (Signed)
Alcohol Intoxication Alcohol intoxication occurs when the amount of alcohol that a person has consumed impairs his or her ability to mentally and physically function. Alcohol directly impairs the normal chemical activity of the brain. Drinking large amounts of alcohol can lead to changes in mental function and behavior, and it can cause many physical effects that can be harmful.  Alcohol intoxication can range in severity from mild to very severe. Various factors can affect the level of intoxication that occurs, such as the person's age, gender, weight, frequency of alcohol consumption, and the presence of other medical conditions (such as diabetes, seizures, or heart conditions). Dangerous levels of alcohol intoxication may occur when people drink large amounts of alcohol in a short period (binge drinking). Alcohol can also be especially dangerous when combined with certain prescription medicines or "recreational" drugs. SIGNS AND SYMPTOMS Some common signs and symptoms of mild alcohol intoxication include:  Loss of coordination.  Changes in mood and behavior.  Impaired judgment.  Slurred speech. As alcohol intoxication progresses to more severe levels, other signs and symptoms will appear. These may include:  Vomiting.  Confusion and impaired memory.  Slowed breathing.  Seizures.  Loss of consciousness. DIAGNOSIS  Your health care provider will take a medical history and perform a physical exam. You will be asked about the amount and type of alcohol you have consumed. Blood tests will be done to measure the concentration of alcohol in your blood. In many places, your blood alcohol level must be lower than 80 mg/dL (0.08%) to legally drive. However, many dangerous effects of alcohol can occur at much lower levels.  TREATMENT  People with alcohol intoxication often do not require treatment. Most of the effects of alcohol intoxication are temporary, and they go away as the alcohol naturally  leaves the body. Your health care provider will monitor your condition until you are stable enough to go home. Fluids are sometimes given through an IV access tube to help prevent dehydration.  HOME CARE INSTRUCTIONS  Do not drive after drinking alcohol.  Stay hydrated. Drink enough water and fluids to keep your urine clear or pale yellow. Avoid caffeine.   Only take over-the-counter or prescription medicines as directed by your health care provider.  SEEK MEDICAL CARE IF:   You have persistent vomiting.   You do not feel better after a few days.  You have frequent alcohol intoxication. Your health care provider can help determine if you should see a substance use treatment counselor. SEEK IMMEDIATE MEDICAL CARE IF:   You become shaky or tremble when you try to stop drinking.   You shake uncontrollably (seizure).   You throw up (vomit) blood. This may be bright red or may look like black coffee grounds.   You have blood in your stool. This may be bright red or may appear as a black, tarry, bad smelling stool.   You become lightheaded or faint.  MAKE SURE YOU:   Understand these instructions.  Will watch your condition.  Will get help right away if you are not doing well or get worse. Document Released: 02/03/2005 Document Revised: 12/27/2012 Document Reviewed: 09/29/2012 [facility] Patient Information 2014 [place].

## 2013-08-24 NOTE — ED Provider Notes (Signed)
58 year old male with history of alcohol abuse presents today stating that he wishes to stop drinking. Initially seen by Dr. Cathleen Fears and patient referred for DSS evaluation. TS S. evaluated and states that he does not want inpatient. He has been hemodynamically stable here received IV fluids as he appeared to be volume depleted. Per TS S. recommendation, patient given prescription for Ativan numbering 6 to be taken twice a day for withdrawal over the  Next 3 days for alcohol withdrawal.    Shaune Pollack, MD 08/24/13 2224

## 2013-08-24 NOTE — [facility] Suicide Risk Assessment (Addendum)
Suicide Risk Assessment  Discharge Assessment     Demographic Factors:  Male, Caucasian and Living alone  Total Time spent with patient: 20 minutes  Psychiatric Specialty Exam:     Blood pressure 133/81, pulse 92, temperature 98.3 F (36.8 C), temperature source Oral, resp. rate 18, SpO2 94.00%.There is no weight on file to calculate BMI.  General Appearance: Casual  Eye Contact::  Good  Speech:  Normal Rate  Volume:  Normal  Mood:  Euthymic  Affect:  Congruent  Thought Process:  Coherent  Orientation:  Full (Time, Place, and Person)  Thought Content:  WDL  Suicidal Thoughts:  No  Homicidal Thoughts:  No  Memory:  Immediate;   Good Recent;   Good Remote;   Good  Judgement:  Fair  Insight:  Fair  Psychomotor Activity:  Decreased  Concentration:  Fair  Recall:  Freedom of Knowledge:Good  Language: Good  Akathisia:  No  Handed:  Right  AIMS (if indicated):     Assets:  Leisure Time Resilience  Sleep:      Musculoskeletal: Strength & Muscle Tone: within normal limits Gait & Station: normal Patient leans: N/A  Mental Status Per Nursing Assessment::   On Admission:     Current Mental Status by Physician: NA  Loss Factors: NA  Historical Factors: Family history of mental illness or substance abuse  Risk Reduction Factors:   Sense of responsibility to family, Employed and Positive social support  Continued Clinical Symptoms:  Alcohol/Substance Abuse/Dependencies  Cognitive Features That Contribute To Risk:  None  Suicide Risk:  Minimal: No identifiable suicidal ideation.  Patients presenting with no risk factors but with morbid ruminations; may be classified as minimal risk based on the severity of the depressive symptoms  Discharge Diagnoses:   AXIS I:  Alcohol Abuse AXIS II:  Deferred AXIS III:   Past Medical History  Diagnosis Date  . Hepatitis C   . Diabetes mellitus   . Chronic hyponatremia     pt denies having   . Alcoholic   .  Alcoholic cirrhosis of liver     Pt calls this a false diagnosis. Pt denies  . Thrombocytopenia   . Hyperglycemia    AXIS IV:  other psychosocial or environmental problems, problems related to social environment and problems with primary support group AXIS V:  61-70 mild symptoms  Plan Of Care/Follow-up recommendations:  Activity:  as tolerated  Diet:  heart healthy diet  Is patient on multiple antipsychotic therapies at discharge:  No   Has Patient had three or more failed trials of antipsychotic monotherapy by history:  No  Recommended Plan for Multiple Antipsychotic Therapies: NA    Waylan Boga, PMH-NP 08/24/2013, 4:06 PM

## 2013-08-24 NOTE — BH Assessment (Signed)
[place] Assessment Progress Note   Spoke with  Ethel, PA re: history of patient.  Pt will be seen by TTS.

## 2013-08-24 NOTE — ED Notes (Signed)
Per pt here because he drank last night and did cocaine-"wants Normal Saline and ativan but does not want detox or rehab because it takes too long"

## 2013-08-24 NOTE — ED Provider Notes (Signed)
Patient requesting detox from alcohol. Also used cocaine this morning. Last used alcohol this morning. Has tried stopping alcohol on his own previously, without success. Patient is alert nontoxic Glasgow Coma Score 15.  [name] Dakin, MD 08/24/13 1353

## 2013-08-24 NOTE — Consult Note (Signed)
[facility] Consult   Reason for Consult:  Alcohol detox/intoxication Referring Physician:  ED MD  [name] is an 58 y.o. male. Total Time spent with patient: 20 minutes  Assessment: AXIS I:  Alcohol Abuse AXIS II:  Deferred AXIS III:   Past Medical History  Diagnosis Date  . Hepatitis C   . Diabetes mellitus   . Chronic hyponatremia     pt denies having   . Alcoholic   . Alcoholic cirrhosis of liver     Pt calls this a false diagnosis. Pt denies  . Thrombocytopenia   . Hyperglycemia    AXIS IV:  other psychosocial or environmental problems, problems related to social environment and problems with primary support group AXIS V:  61-70 mild symptoms  Plan:  No evidence of imminent risk to self or others at present.    Subjective:   [name] is a 58 y.o. male patient does not warrant admission.  HPI:  Patient started getting sick today and could not go to work.  He then started having withdrawal symptoms and had to drink two beers to stop the symptoms.  His drinking has been increasing over the past few weeks.  [name] does not want inpatient detox but will stay for fluids to get rehydrated since his labs indicate dehydration.  A few ativan Rx given to take home to help him monitor his withdrawal symptoms with education on when to return to the ED---worsening symptoms, symptoms not controlled, if he changes his mind about detox, encourage fluids and gatorade, etc.  Cocaine use once yesterday but this is not normal for him.  Denies suicidal/homicidal ideations and hallucinations. HPI Elements:   Location:  generalized. Quality:  acute. Severity:  moderate. Timing:  cocaine use. Duration:  since last night. Context:  stressors.  Past Psychiatric History: Past Medical History  Diagnosis Date  . Hepatitis C   . Diabetes mellitus   . Chronic hyponatremia     pt denies having   . Alcoholic   . Alcoholic cirrhosis of liver     Pt calls this  a false diagnosis. Pt denies  . Thrombocytopenia   . Hyperglycemia     reports that he has been smoking Cigarettes.  He has a 40 pack-year smoking history. He has never used smokeless tobacco. He reports that he drinks about 9 ounces of alcohol per week. He reports that he does not use illicit drugs. Family History  Problem Relation Age of Onset  . ALS Father    Family History Substance Abuse: Yes, Describe: Family Supports: No Living Arrangements: Alone (hotel) Can pt return to current living arrangement?: Yes Abuse/Neglect [facility]) Physical Abuse: Denies Verbal Abuse: Denies Sexual Abuse: Denies Allergies:   Allergies  Allergen Reactions  . Benadryl [Diphenhydramine Hcl]     "Causes nervousness"  . Librium [Chlordiazepoxide Hcl] Anxiety  . Sulfa Antibiotics Rash    ACT Assessment Complete:  Yes:    Educational Status    Risk to Self: Risk to self Suicidal Ideation: No Suicidal Intent: No Is patient at risk for suicide?: No Suicidal Plan?: No Access to Means: No What has been your use of drugs/alcohol within the last 12 months?: drank and used cocaine last night, drank this am Previous Attempts/Gestures: No Other Self Harm Risks:  (none known) Intentional Self Injurious Behavior: None Family Suicide History: No Recent stressful life event(s): Financial Problems;Legal Issues Persecutory voices/beliefs?: No Depression: Yes Depression Symptoms: Guilt;Feeling worthless/self pity;Feeling angry/irritable Substance abuse history  and/or treatment for substance abuse?: Yes Suicide prevention information given to non-admitted patients: Not applicable  Risk to Others: Risk to Others Homicidal Ideation: No Thoughts of Harm to Others: No Current Homicidal Intent: No Current Homicidal Plan: No Access to Homicidal Means: No History of harm to others?: No Assessment of Violence: None Noted Does patient have access to weapons?: No Criminal Charges Pending?: No Does patient have a  court date: No  Abuse: Abuse/Neglect Assessment (Assessment to be complete while patient is alone) Physical Abuse: Denies Verbal Abuse: Denies Sexual Abuse: Denies Exploitation of patient/patient's resources: Denies Self-Neglect: Denies  Prior Inpatient Therapy: Prior Inpatient Therapy Prior Inpatient Therapy: Yes Prior Therapy Dates: multiple; most recent 3 months ago Prior Therapy Facilty/Provider(s): [facility] Reason for Treatment: detox  Prior Outpatient Therapy: Prior Outpatient Therapy Prior Outpatient Therapy: Yes Prior Therapy Dates:  (unknown) Prior Therapy Facilty/Provider(s):  ([facility]) Reason for Treatment:  (alcohol)  Additional Information: Additional Information 1:1 In Past 12 Months?: No CIRT Risk: No Elopement Risk: No Does patient have medical clearance?: Yes                  Objective: Blood pressure 133/81, pulse 92, temperature 98.3 F (36.8 C), temperature source Oral, resp. rate 18, SpO2 94.00%.There is no weight on file to calculate BMI. Results for orders placed during the hospital encounter of 08/24/13 (from the past 72 hour(s))  CBC WITH DIFFERENTIAL     Status: Abnormal   Collection Time    08/24/13  2:16 PM      Result Value Ref Range   WBC 10.9 (*) 4.0 - 10.5 K/uL   RBC 4.21 (*) 4.22 - 5.81 MIL/uL   Hemoglobin 14.9  13.0 - 17.0 g/dL   HCT 40.8  39.0 - 52.0 %   MCV 96.9  78.0 - 100.0 fL   MCH 35.4 (*) 26.0 - 34.0 pg   MCHC 36.5 (*) 30.0 - 36.0 g/dL   RDW 13.6  11.5 - 15.5 %   Platelets 96 (*) 150 - 400 K/uL   Comment: REPEATED TO VERIFY     SPECIMEN CHECKED FOR CLOTS     PLATELET COUNT CONFIRMED BY SMEAR   Neutrophils Relative % 67  43 - 77 %   Neutro Abs 7.4  1.7 - 7.7 K/uL   Lymphocytes Relative 22  12 - 46 %   Lymphs Abs 2.4  0.7 - 4.0 K/uL   Monocytes Relative 4  3 - 12 %   Monocytes Absolute 0.4  0.1 - 1.0 K/uL   Eosinophils Relative 7 (*) 0 - 5 %   Eosinophils Absolute 0.7  0.0 - 0.7 K/uL   Basophils Relative 1  0 - 1 %    Basophils Absolute 0.1  0.0 - 0.1 K/uL  COMPREHENSIVE METABOLIC PANEL     Status: Abnormal   Collection Time    08/24/13  2:16 PM      Result Value Ref Range   Sodium 130 (*) 137 - 147 mEq/L   Potassium 4.5  3.7 - 5.3 mEq/L   Chloride 91 (*) 96 - 112 mEq/L   CO2 22  19 - 32 mEq/L   Glucose, Bld 380 (*) 70 - 99 mg/dL   BUN 7  6 - 23 mg/dL   Creatinine, Ser 0.43 (*) 0.50 - 1.35 mg/dL   Calcium 9.0  8.4 - 10.5 mg/dL   Total Protein 7.8  6.0 - 8.3 g/dL   Albumin 3.3 (*) 3.5 - 5.2 g/dL   AST  45 (*) 0 - 37 U/L   Comment: NO VISIBLE HEMOLYSIS   ALT 35  0 - 53 U/L   Alkaline Phosphatase 121 (*) 39 - 117 U/L   Total Bilirubin 0.6  0.3 - 1.2 mg/dL   GFR calc non Af Amer >90  >90 mL/min   GFR calc Af Amer >90  >90 mL/min   Comment: (NOTE)     The eGFR has been calculated using the CKD EPI equation.     This calculation has not been validated in all clinical situations.     eGFR's persistently <90 mL/min signify possible Chronic Kidney     Disease.  ACETAMINOPHEN LEVEL     Status: None   Collection Time    08/24/13  2:16 PM      Result Value Ref Range   Acetaminophen (Tylenol), Serum <15.0  10 - 30 ug/mL   Comment:            THERAPEUTIC CONCENTRATIONS VARY     SIGNIFICANTLY. A RANGE OF 10-30     ug/mL MAY BE AN EFFECTIVE     CONCENTRATION FOR MANY PATIENTS.     HOWEVER, SOME ARE BEST TREATED     AT CONCENTRATIONS OUTSIDE THIS     RANGE.     ACETAMINOPHEN CONCENTRATIONS     >150 ug/mL AT 4 HOURS AFTER     INGESTION AND >50 ug/mL AT 12     HOURS AFTER INGESTION ARE     OFTEN ASSOCIATED WITH TOXIC     REACTIONS.  SALICYLATE LEVEL     Status: Abnormal   Collection Time    08/24/13  2:16 PM      Result Value Ref Range   Salicylate Lvl <2.2 (*) 2.8 - 20.0 mg/dL   Labs are reviewed and are pertinent for medical issues being addressed.  Current Facility-Administered Medications  Medication Dose Route Frequency Provider Last Rate Last Dose  . LORazepam (ATIVAN) tablet 0-4 mg  0-4  mg Oral 4 times per day Alvina Chou, PA-C       Followed by  . [START ON 08/26/2013] LORazepam (ATIVAN) tablet 0-4 mg  0-4 mg Oral Q12H [employer], PA-C      . LORazepam (ATIVAN) tablet 2 mg  2 mg Oral Once Waylan Boga, NP      . sodium chloride 0.9 % 1,000 mL with thiamine 025 mg, folic acid 1 mg, multivitamins adult 10 mL infusion   Intravenous Once Waylan Boga, NP      . thiamine (VITAMIN B-1) tablet 100 mg  100 mg Oral Daily Alvina Chou, PA-C   100 mg at 08/24/13 1453   Current Outpatient Prescriptions  Medication Sig Dispense Refill  . metFORMIN (GLUCOPHAGE) 500 MG tablet Take 1 tablet (500 mg total) by mouth 2 (two) times daily with a meal.        Psychiatric Specialty Exam:     Blood pressure 133/81, pulse 92, temperature 98.3 F (36.8 C), temperature source Oral, resp. rate 18, SpO2 94.00%.There is no weight on file to calculate BMI.  General Appearance: Casual  Eye Contact::  Good  Speech:  Normal Rate  Volume:  Normal  Mood:  Euthymic  Affect:  Congruent  Thought Process:  Coherent  Orientation:  Full (Time, Place, and Person)  Thought Content:  WDL  Suicidal Thoughts:  No  Homicidal Thoughts:  No  Memory:  Immediate;   Good Recent;   Good Remote;   Good  Judgement:  Fair  Insight:  Fair  Psychomotor Activity:  Decreased  Concentration:  Fair  Recall:  [employer] of Knowledge:Good  Language: Good  Akathisia:  No  Handed:  Right  AIMS (if indicated):     Assets:  Leisure Time Resilience  Sleep:      Musculoskeletal: Strength & Muscle Tone: within normal limits Gait & Station: normal Patient leans: N/A  Treatment Plan Summary: MVI IV bag ordered for hydration, ativan given along with a Rx for a few to manage withdrawal symptoms, follow-up with outside provider and return to the ED if symptoms worsen or don't resolve.  Waylan Boga, PMH-NP 08/24/2013 3:53 PM Agree with above.

## 2013-08-24 NOTE — Progress Notes (Signed)
[facility] provided pt with a list of primary care resources, highlighting [facility], and a [facility] [employer], highlighting [place] to help patient establish primary care.

## 2013-09-04 NOTE — ED Provider Notes (Signed)
Medical screening examination/treatment/procedure(s) were conducted as a shared visit with non-physician practitioner(s) and myself.  I personally evaluated the patient during the encounter.   EKG Interpretation None       Orlie Dakin, MD 09/04/13 0004

## 2013-09-11 ENCOUNTER — Emergency Department (HOSPITAL_COMMUNITY)
Admission: EM | Admit: 2013-09-11 | Discharge: 2013-09-12 | Disposition: A | Discharge status: Other Acute Inpatient Hospital | Payer: Federal, State, Local not specified - Other | Attending: Emergency Medicine | Admitting: Emergency Medicine

## 2013-09-11 ENCOUNTER — Encounter (HOSPITAL_COMMUNITY): Payer: Self-pay | Admitting: Emergency Medicine

## 2013-09-11 DIAGNOSIS — Z09 Encounter for follow-up examination after completed treatment for conditions other than malignant neoplasm: Secondary | ICD-10-CM

## 2013-09-11 DIAGNOSIS — F1020 Alcohol dependence, uncomplicated: Secondary | ICD-10-CM | POA: Insufficient documentation

## 2013-09-11 DIAGNOSIS — Z79899 Other long term (current) drug therapy: Secondary | ICD-10-CM | POA: Insufficient documentation

## 2013-09-11 DIAGNOSIS — F1410 Cocaine abuse, uncomplicated: Secondary | ICD-10-CM | POA: Insufficient documentation

## 2013-09-11 DIAGNOSIS — Z9289 Personal history of other medical treatment: Secondary | ICD-10-CM

## 2013-09-11 DIAGNOSIS — F17200 Nicotine dependence, unspecified, uncomplicated: Secondary | ICD-10-CM | POA: Insufficient documentation

## 2013-09-11 DIAGNOSIS — F419 Anxiety disorder, unspecified: Secondary | ICD-10-CM | POA: Diagnosis present

## 2013-09-11 DIAGNOSIS — F1994 Other psychoactive substance use, unspecified with psychoactive substance-induced mood disorder: Secondary | ICD-10-CM | POA: Insufficient documentation

## 2013-09-11 DIAGNOSIS — Z862 Personal history of diseases of the blood and blood-forming organs and certain disorders involving the immune mechanism: Secondary | ICD-10-CM | POA: Insufficient documentation

## 2013-09-11 DIAGNOSIS — Z8619 Personal history of other infectious and parasitic diseases: Secondary | ICD-10-CM | POA: Insufficient documentation

## 2013-09-11 DIAGNOSIS — F1210 Cannabis abuse, uncomplicated: Secondary | ICD-10-CM | POA: Insufficient documentation

## 2013-09-11 DIAGNOSIS — F1910 Other psychoactive substance abuse, uncomplicated: Secondary | ICD-10-CM | POA: Diagnosis present

## 2013-09-11 DIAGNOSIS — Z8719 Personal history of other diseases of the digestive system: Secondary | ICD-10-CM | POA: Insufficient documentation

## 2013-09-11 DIAGNOSIS — E119 Type 2 diabetes mellitus without complications: Secondary | ICD-10-CM | POA: Insufficient documentation

## 2013-09-11 DIAGNOSIS — F29 Unspecified psychosis not due to a substance or known physiological condition: Secondary | ICD-10-CM

## 2013-09-11 LAB — COMPREHENSIVE METABOLIC PANEL
ALBUMIN: 3.6 g/dL (ref 3.5–5.2)
ALT: 68 U/L — ABNORMAL HIGH (ref 0–53)
AST: 86 U/L — ABNORMAL HIGH (ref 0–37)
Alkaline Phosphatase: 111 U/L (ref 39–117)
BILIRUBIN TOTAL: 0.4 mg/dL (ref 0.3–1.2)
BUN: 8 mg/dL (ref 6–23)
CHLORIDE: 92 meq/L — AB (ref 96–112)
CO2: 25 mEq/L (ref 19–32)
CREATININE: 0.56 mg/dL (ref 0.50–1.35)
Calcium: 9 mg/dL (ref 8.4–10.5)
GFR calc Af Amer: >90 mL/min (ref >90)
GFR calc non Af Amer: >90 mL/min (ref >90)
GLUCOSE: 323 mg/dL — AB (ref 70–99)
Potassium: 4.7 mEq/L (ref 3.7–5.3)
Sodium: 131 mEq/L — ABNORMAL LOW (ref 137–147)
Total Protein: 8.1 g/dL (ref 6.0–8.3)

## 2013-09-11 LAB — CBG MONITORING, ED: GLUCOSE-CAPILLARY: 336 mg/dL — AB (ref 70–99)

## 2013-09-11 LAB — CBC WITH DIFFERENTIAL/PLATELET
BASOS PCT: 1 % (ref 0–1)
Basophils Absolute: 0 10*3/uL (ref 0.0–0.1)
Eosinophils Absolute: 0.1 10*3/uL (ref 0.0–0.7)
Eosinophils Relative: 1 % (ref 0–5)
HEMATOCRIT: 41.7 % (ref 39.0–52.0)
HEMOGLOBIN: 15.3 g/dL (ref 13.0–17.0)
Lymphocytes Relative: 27 % (ref 12–46)
Lymphs Abs: 2 10*3/uL (ref 0.7–4.0)
MCH: 35.8 pg — ABNORMAL HIGH (ref 26.0–34.0)
MCHC: 36.7 g/dL — AB (ref 30.0–36.0)
MCV: 97.7 fL (ref 78.0–100.0)
MONO ABS: 0.5 10*3/uL (ref 0.1–1.0)
MONOS PCT: 7 % (ref 3–12)
NEUTROS PCT: 64 % (ref 43–77)
Neutro Abs: 4.8 10*3/uL (ref 1.7–7.7)
Platelets: 123 10*3/uL — ABNORMAL LOW (ref 150–400)
RBC: 4.27 MIL/uL (ref 4.22–5.81)
RDW: 13 % (ref 11.5–15.5)
WBC: 7.5 10*3/uL (ref 4.0–10.5)

## 2013-09-11 LAB — RAPID URINE DRUG SCREEN, HOSP PERFORMED
Amphetamines: NOT DETECTED
Barbiturates: NOT DETECTED
Benzodiazepines: NOT DETECTED
Cocaine: NOT DETECTED
OPIATES: NOT DETECTED
Tetrahydrocannabinol: NOT DETECTED

## 2013-09-11 LAB — ETHANOL: Alcohol, Ethyl (B): 233 mg/dL — ABNORMAL HIGH (ref 0–11)

## 2013-09-11 MED ORDER — LORAZEPAM 2 MG/ML IJ SOLN: 0.0000 mg | Freq: Two times a day (BID) | INTRAMUSCULAR | Status: DC

## 2013-09-11 MED ORDER — FOLIC ACID 1 MG PO TABS: 1.0000 mg | ORAL_TABLET | Freq: Every day | ORAL | Status: DC

## 2013-09-11 MED ORDER — LORAZEPAM 2 MG/ML IJ SOLN: 1.0000 mg | Freq: Four times a day (QID) | INTRAMUSCULAR | Status: DC | PRN

## 2013-09-11 MED ORDER — METFORMIN HCL 500 MG PO TABS
500.0000 mg | ORAL_TABLET | Freq: Two times a day (BID) | ORAL | Status: DC
  Administered 2013-09-11 – 2013-09-12 (×2): 500 mg via ORAL
  Filled 2013-09-11 (×4): qty 1

## 2013-09-11 MED ORDER — LORAZEPAM 1 MG PO TABS: 0.0000 mg | ORAL_TABLET | Freq: Two times a day (BID) | ORAL | Status: DC

## 2013-09-11 MED ORDER — ADULT MULTIVITAMIN W/MINERALS CH
1.0000 | ORAL_TABLET | Freq: Every day | ORAL | Status: DC
  Administered 2013-09-11 – 2013-09-12 (×2): 1 via ORAL
  Filled 2013-09-11 (×2): qty 1

## 2013-09-11 MED ORDER — BENZTROPINE MESYLATE 1 MG PO TABS: 1.0000 mg | ORAL_TABLET | Freq: Two times a day (BID) | ORAL | Status: DC

## 2013-09-11 MED ORDER — THIAMINE HCL 100 MG/ML IJ SOLN: 100.0000 mg | Freq: Every day | INTRAMUSCULAR | Status: DC

## 2013-09-11 MED ORDER — LORAZEPAM 1 MG PO TABS: 0.0000 mg | ORAL_TABLET | Freq: Four times a day (QID) | ORAL | Status: DC

## 2013-09-11 MED ORDER — LORAZEPAM 1 MG PO TABS
1.0000 mg | ORAL_TABLET | Freq: Four times a day (QID) | ORAL | Status: DC | PRN
  Administered 2013-09-12: 1 mg via ORAL
  Filled 2013-09-11: qty 1

## 2013-09-11 MED ORDER — VITAMIN B-1 100 MG PO TABS
100.0000 mg | ORAL_TABLET | Freq: Every day | ORAL | Status: DC
  Administered 2013-09-12: 100 mg via ORAL
  Filled 2013-09-11: qty 1

## 2013-09-11 MED ORDER — HALOPERIDOL 1 MG PO TABS: 0.5000 mg | ORAL_TABLET | Freq: Every day | ORAL | Status: DC

## 2013-09-11 MED ORDER — LORAZEPAM 2 MG/ML IJ SOLN: 0.0000 mg | Freq: Four times a day (QID) | INTRAMUSCULAR | Status: DC

## 2013-09-11 MED ORDER — LORAZEPAM 1 MG PO TABS
0.0000 mg | ORAL_TABLET | Freq: Four times a day (QID) | ORAL | Status: DC
  Administered 2013-09-11: 2 mg via ORAL
  Administered 2013-09-12: 1 mg via ORAL
  Filled 2013-09-11: qty 2
  Filled 2013-09-11: qty 1

## 2013-09-11 MED ORDER — VITAMIN B-1 100 MG PO TABS
100.0000 mg | ORAL_TABLET | Freq: Every day | ORAL | Status: DC
  Administered 2013-09-11: 100 mg via ORAL
  Filled 2013-09-11: qty 1

## 2013-09-11 NOTE — Progress Notes (Signed)
[facility] provided pt with a list of primary care resources to help patient establish primary care.

## 2013-09-11 NOTE — BH Assessment (Signed)
Assessment Note  [name] is an 58 y.o. male. Pt presents to [facility] with C/O medical clearance and Etoh Detox. Pt reports that he drinks a 24 pack of beer daily. Pt denies using any other substances currently but reports that he used a "tiny bit of Cocaine"  could not specify when. Pt reports that he does not have a problem with abusing Cocaine. Pt reports that he uses Marijuana on occasion. Pt reports that he is living in a hotel but had to check out today because he could no longer afford it. Pt reports increased anxiety related to his financial situation and legal problems(probation). Pt reports that he can't afford to miss any time off work because he needs the money. Pt is currently on probation for writing bad checks. Pt works with a Chief Strategy Officer as a Actor. Pt denies SI,HI, and no AVH reported. Pt reports that he feels like "crap" when questioned about withdraw symptoms. Pt denies hx of seizures or DT's. Pt reports problems with his liver, Hep C, and Blood Sugar health problems as a result of his drinking. Pt denies SI,HI, and no AVH reported.  Consulted with Shuvon Rankin,NP who is admitting patient for inpatient detox at [facility]. Pt assigned to bed 306-1. Support paperwork completed.  Axis I: Alcohol Use Disorder, Severe Axis II: Deferred Axis III:  Past Medical History  Diagnosis Date  . Hepatitis C   . Diabetes mellitus   . Chronic hyponatremia     pt denies having   . Alcoholic   . Alcoholic cirrhosis of liver     Pt calls this a false diagnosis. Pt denies  . Thrombocytopenia   . Hyperglycemia    Axis IV: economic problems, housing problems, occupational problems, other psychosocial or environmental problems and problems related to legal system/crime Axis V: 31-40 impairment in reality testing  Past Medical History:  Past Medical History  Diagnosis Date  . Hepatitis C   . Diabetes mellitus   . Chronic hyponatremia     pt denies having   . Alcoholic   . Alcoholic  cirrhosis of liver     Pt calls this a false diagnosis. Pt denies  . Thrombocytopenia   . Hyperglycemia     Past Surgical History  Procedure Laterality Date  . Plate in lower right leg  2008    Family History:  Family History  Problem Relation Age of Onset  . ALS Father     Social History:  reports that he has been smoking Cigarettes.  He has a 40 pack-year smoking history. He has never used smokeless tobacco. He reports that he drinks about 9 ounces of alcohol per week. He reports that he does not use illicit drugs.  Additional Social History:  Alcohol / Drug Use History of alcohol / drug use?: Yes Substance #1 Name of Substance 1:  (Alcohol-beers) 1 - Age of First Use:  (14) 1 - Amount (size/oz):  (24 case of beer) 1 - Frequency:  (Daily) 1 - Duration:  (past 3 months) 1 - Last Use / Amount:  (09/11/13-couple of 40 oz beers)  CIWA: CIWA-Ar BP: 138/83 mmHg Pulse Rate: 91 COWS:    Allergies:  Allergies  Allergen Reactions  . Benadryl [Diphenhydramine Hcl]     "Causes nervousness"  . Librium [Chlordiazepoxide Hcl] Anxiety  . Sulfa Antibiotics Rash    Home Medications:  (Not in a hospital admission)  OB/GYN Status:  No LMP for male patient.  General Assessment Data [facility] of [facility]: [facility]  ED Is this a Tele or Face-to-Face Assessment?: Face-to-Face Is this an Initial Assessment or a Re-assessment for this encounter?: Initial Assessment Living Arrangements: Alone (was living at hotel checkout today d/t finances and detox tx) Can pt return to current living arrangement?: Yes Admission Status: Voluntary Is patient capable of signing voluntary admission?: Yes Transfer from: Home Referral Source: Self/Family/Friend     [place] Living Arrangements: Alone (was living at hotel checkout today d/t finances and detox tx) Name of Psychiatrist: No Current Provider Name of Therapist: No Current Provider     Risk to self Suicidal Ideation: No Suicidal  Intent: No Is patient at risk for suicide?: No Suicidal Plan?: No Access to Means: No What has been your use of drugs/alcohol within the last 12 months?: drank a couple of 40's of beer this morning Previous Attempts/Gestures: No How many times?: 0 Other Self Harm Risks: none known Triggers for Past Attempts: None known Intentional Self Injurious Behavior: None Family Suicide History: No Recent stressful life event(s): Financial Problems;Legal Issues Persecutory voices/beliefs?: No Depression: No (Pt just reports feeling anxious his finances and legal probl) Substance abuse history and/or treatment for substance abuse?: Yes Suicide prevention information given to non-admitted patients: Not applicable  Risk to Others Homicidal Ideation: No Thoughts of Harm to Others: No Current Homicidal Intent: No Current Homicidal Plan: No Access to Homicidal Means: No Identified Victim: na History of harm to others?: No Assessment of Violence: None Noted Violent Behavior Description: None Noted Does patient have access to weapons?: No Criminal Charges Pending?: No Does patient have a court date: No  Psychosis Hallucinations: None noted Delusions: None noted  Mental Status Report Appear/Hygiene: Disheveled Eye Contact: Fair Motor Activity: Freedom of movement Speech: Logical/coherent Level of Consciousness: Alert Mood: Anxious Affect: Anxious;Appropriate to circumstance Anxiety Level: Moderate Thought Processes: Relevant Judgement: Impaired Orientation: Person;Place;Time;Situation Obsessive Compulsive Thoughts/Behaviors: None  Cognitive Functioning Concentration: Normal Memory: Recent Intact;Remote Intact IQ: Average Insight: Fair Impulse Control: Fair Appetite: Poor Weight Loss: 0 Weight Gain: 0 Sleep: Decreased Total Hours of Sleep:  (varies, pt unable to specify) Vegetative Symptoms: None  ADLScreening [facility] Assessment Services) Patient's cognitive ability adequate to  safely complete daily activities?: Yes Patient able to express need for assistance with ADLs?: Yes Independently performs ADLs?: Yes (appropriate for developmental age)  Prior Inpatient Therapy Prior Inpatient Therapy: Yes Prior Therapy Dates: multiple, pt reports that he was evaluated at [facility] for 24 hours in 08/2013 Prior Therapy Facilty/Provider(s): [facility] Reason for Treatment: detox   Prior Outpatient Therapy Prior Outpatient Therapy: Yes Prior Therapy Dates: [facility] Prior Therapy [facility]ilty/Provider(s): [facility] Reason for Treatment: [facility]  ADL Screening (condition at time of admission) Patient's cognitive ability adequate to safely complete daily activities?: Yes Is the patient deaf or have difficulty hearing?: No Does the patient have difficulty seeing, even when wearing glasses/contacts?: No Does the patient have difficulty concentrating, remembering, or making decisions?: No Patient able to express need for assistance with ADLs?: Yes Does the patient have difficulty dressing or bathing?: No Independently performs ADLs?: Yes (appropriate for developmental age) Does the patient have difficulty walking or climbing stairs?: No Weakness of Legs: None Weakness of Arms/Hands: None  Home Assistive Devices/Equipment Home Assistive Devices/Equipment: None    Abuse/Neglect Assessment (Assessment to be complete while patient is alone) Physical Abuse: Denies Verbal Abuse: Denies Sexual Abuse: Denies Exploitation of patient/patient's resources: Denies Self-Neglect: Denies Values / Beliefs Cultural Requests During Hospitalization: None Spiritual Requests During Hospitalization: None   Advance Directives (For Healthcare)  Advance Directive: Patient does not have advance directive;Patient would not like information    Additional Information 1:1 In Past 12 Months?: No CIRT Risk: No Elopement Risk: No Does patient have medical clearance?: Yes     Disposition:  Disposition Initial Assessment  Completed for this Encounter: Yes Disposition of Patient: Inpatient treatment program Type of inpatient treatment program: Adult  On Site Evaluation by:   Reviewed with Physician:    Wellington Hampshire, MS, LCASA Assessment Counselor  09/11/2013 5:37 PM

## 2013-09-11 NOTE — Consult Note (Signed)
  Agree with TTS assessment for inpatient detox.  Patient accepted to [facility] 306/01  Ogden Rankin FNP-BC

## 2013-09-11 NOTE — Consult Note (Deleted)
[facility] Consult   Reason for Consult:  Requesting detox Referring Physician:  ER MD  [name] is an 58 y.o. male. Total Time spent with patient: 45 minutes  Assessment: AXIS I:  alcohol dependence and psychotic disorder, nos AXIS II:  Deferred AXIS III:   Past Medical History  Diagnosis Date  . Hepatitis C   . Diabetes mellitus   . Chronic hyponatremia     pt denies having   . Alcoholic   . Alcoholic cirrhosis of liver     Pt calls this a false diagnosis. Pt denies  . Thrombocytopenia   . Hyperglycemia    AXIS IV:  addiction AXIS V:  51-60 moderate symptoms  Plan:  Recommend psychiatric Inpatient admission when medically cleared.  Subjective:   [name] is a 58 y.o. male patient admitted with request for detox.  HPI:  [name] says he drinks a case of beer daily and wants to be off.  He also takes Haldol, he says and has [name].  He is depressed but not suicidal. He still hears voices and thinks his dose needs increasing. HPI Elements:   Location:  addiction. Quality:  cannot stop. Severity:  drinks 24 beers daily. Timing:  chronic addiction. Duration:  years. Context:  as above.  Past Psychiatric History: Past Medical History  Diagnosis Date  . Hepatitis C   . Diabetes mellitus   . Chronic hyponatremia     pt denies having   . Alcoholic   . Alcoholic cirrhosis of liver     Pt calls this a false diagnosis. Pt denies  . Thrombocytopenia   . Hyperglycemia     reports that he has been smoking Cigarettes.  He has a 40 pack-year smoking history. He has never used smokeless tobacco. He reports that he drinks about 9 ounces of alcohol per week. He reports that he does not use illicit drugs. Family History  Problem Relation Age of Onset  . ALS [name]            Allergies:   Allergies  Allergen Reactions  . Benadryl [Diphenhydramine Hcl]     "Causes nervousness"  . Librium [Chlordiazepoxide Hcl] Anxiety  . Sulfa  Antibiotics Rash    ACT Assessment Complete:  Yes:    Educational Status    Risk to Self: Risk to self Is patient at risk for suicide?: No Substance abuse history and/or treatment for substance abuse?: Yes  Risk to Others:    Abuse:    Prior Inpatient Therapy:    Prior Outpatient Therapy:    Additional Information:                    Objective: Blood pressure 138/83, pulse 91, temperature 97.8 F (36.6 C), temperature source Oral, resp. rate 16, height 6' (1.829 m), weight 74.844 kg (165 lb), SpO2 96.00%.Body mass index is 22.37 kg/(m^2). Results for orders placed during the hospital encounter of 09/11/13 (from the past 72 hour(s))  CBC WITH DIFFERENTIAL     Status: Abnormal   Collection Time    09/11/13  2:10 PM      Result Value Ref Range   WBC 7.5  4.0 - 10.5 K/uL   RBC 4.27  4.22 - 5.81 MIL/uL   Hemoglobin 15.3  13.0 - 17.0 g/dL   HCT 41.7  39.0 - 52.0 %   MCV 97.7  78.0 - 100.0 fL   MCH 35.8 (*) 26.0 - 34.0 pg   MCHC  36.7 (*) 30.0 - 36.0 g/dL   RDW 13.0  11.5 - 15.5 %   Platelets 123 (*) 150 - 400 K/uL   Neutrophils Relative % 64  43 - 77 %   Neutro Abs 4.8  1.7 - 7.7 K/uL   Lymphocytes Relative 27  12 - 46 %   Lymphs Abs 2.0  0.7 - 4.0 K/uL   Monocytes Relative 7  3 - 12 %   Monocytes Absolute 0.5  0.1 - 1.0 K/uL   Eosinophils Relative 1  0 - 5 %   Eosinophils Absolute 0.1  0.0 - 0.7 K/uL   Basophils Relative 1  0 - 1 %   Basophils Absolute 0.0  0.0 - 0.1 K/uL  COMPREHENSIVE METABOLIC PANEL     Status: Abnormal   Collection Time    09/11/13  2:10 PM      Result Value Ref Range   Sodium 131 (*) 137 - 147 mEq/L   Potassium 4.7  3.7 - 5.3 mEq/L   Chloride 92 (*) 96 - 112 mEq/L   CO2 25  19 - 32 mEq/L   Glucose, Bld 323 (*) 70 - 99 mg/dL   BUN 8  6 - 23 mg/dL   Creatinine, Ser 0.56  0.50 - 1.35 mg/dL   Calcium 9.0  8.4 - 10.5 mg/dL   Total Protein 8.1  6.0 - 8.3 g/dL   Albumin 3.6  3.5 - 5.2 g/dL   AST 86 (*) 0 - 37 U/L   ALT 68 (*) 0 - 53 U/L    Alkaline Phosphatase 111  39 - 117 U/L   Total Bilirubin 0.4  0.3 - 1.2 mg/dL   GFR calc non Af Amer >90  >90 mL/min   GFR calc Af Amer >90  >90 mL/min   Comment: (NOTE)     The eGFR has been calculated using the CKD EPI equation.     This calculation has not been validated in all clinical situations.     eGFR's persistently <90 mL/min signify possible Chronic Kidney     Disease.  ETHANOL     Status: Abnormal   Collection Time    09/11/13  2:10 PM      Result Value Ref Range   Alcohol, Ethyl (B) 233 (*) 0 - 11 mg/dL   Comment:            LOWEST DETECTABLE LIMIT FOR     SERUM ALCOHOL IS 11 mg/dL     FOR MEDICAL PURPOSES ONLY  URINE RAPID DRUG SCREEN (HOSP PERFORMED)     Status: None   Collection Time    09/11/13  2:16 PM      Result Value Ref Range   Opiates NONE DETECTED  NONE DETECTED   Cocaine NONE DETECTED  NONE DETECTED   Benzodiazepines NONE DETECTED  NONE DETECTED   Amphetamines NONE DETECTED  NONE DETECTED   Tetrahydrocannabinol NONE DETECTED  NONE DETECTED   Barbiturates NONE DETECTED  NONE DETECTED   Comment:            DRUG SCREEN FOR MEDICAL PURPOSES     ONLY.  IF CONFIRMATION IS NEEDED     FOR ANY PURPOSE, NOTIFY LAB     WITHIN 5 DAYS.                LOWEST DETECTABLE LIMITS     FOR URINE DRUG SCREEN     Drug Class       Cutoff (ng/mL)  Amphetamine      1000     Barbiturate      200     Benzodiazepine   607     Tricyclics       371     Opiates          300     Cocaine          300     THC              50   Labs are reviewed and are pertinent for blood alcohol level of 233.  Current Facility-Administered Medications  Medication Dose Route Frequency Provider Last Rate Last Dose  . LORazepam (ATIVAN) injection 0-4 mg  0-4 mg Intravenous 4 times per day Elwyn Lade, PA-C       Followed by  . [START ON 09/13/2013] LORazepam (ATIVAN) injection 0-4 mg  0-4 mg Intravenous Q12H Elwyn Lade, PA-C      . LORazepam (ATIVAN) tablet 0-4 mg  0-4 mg Oral  4 times per day Elwyn Lade, PA-C       Followed by  . [START ON 09/13/2013] LORazepam (ATIVAN) tablet 0-4 mg  0-4 mg Oral Q12H Hannah S Merrell, PA-C      . metFORMIN (GLUCOPHAGE) tablet 500 mg  500 mg Oral BID WC Elwyn Lade, PA-C      . thiamine (VITAMIN B-1) tablet 100 mg  100 mg Oral Daily Elwyn Lade, PA-C   100 mg at 09/11/13 1437   Or  . thiamine (B-1) injection 100 mg  100 mg Intravenous Daily Elwyn Lade, PA-C       Current Outpatient Prescriptions  Medication Sig Dispense Refill  . metFORMIN (GLUCOPHAGE) 500 MG tablet Take 1 tablet (500 mg total) by mouth 2 (two) times daily with a meal.        Psychiatric Specialty Exam:     Blood pressure 138/83, pulse 91, temperature 97.8 F (36.6 C), temperature source Oral, resp. rate 16, height 6' (1.829 m), weight 74.844 kg (165 lb), SpO2 96.00%.Body mass index is 22.37 kg/(m^2).  General Appearance: Casual  Eye Contact::  Good  Speech:  Clear and Coherent  Volume:  Normal  Mood:  Anxious  Affect:  Congruent  Thought Process:  Coherent  Orientation:  Full (Time, Place, and Person)  Thought Content:  Hearing voices  Suicidal Thoughts:  No  Homicidal Thoughts:  No  Memory:  Immediate;   Good Recent;   Good Remote;   Good  Judgement:  Intact  Insight:  Fair  Psychomotor Activity:  Normal  Concentration:  Good  Recall:  Good  Fund of Knowledge:Good  Language: Good  Akathisia:  Negative  Handed:  Right  AIMS (if indicated):     Assets:  Communication Skills Desire for Improvement Housing Social Support  Sleep:      Musculoskeletal: Strength & Muscle Tone: within normal limits Gait & Station: normal Patient leans: N/A  Treatment Plan Summary: transfer to [facility] for alcohol detox  Clarene Reamer 09/11/2013 3:16 PM

## 2013-09-11 NOTE — ED Notes (Addendum)
Per [facility]- Pt reports request ETOH detox. Pt drinks 4-5 40 oz /daily. Pt states "going through withdraws" Upon assessment pt appears calm and cooperative. No signs of tremors, c/o of nausea. Denies V/D/hallucinations. Denies SI/HI

## 2013-09-11 NOTE — Consult Note (Deleted)
  Review of Systems  Constitutional: Negative.   HENT: Negative.   Eyes: Negative.   Respiratory: Negative.   Cardiovascular: Negative.   Gastrointestinal: Negative.   Genitourinary: Negative.   Musculoskeletal: Negative.   Skin: Negative.   Neurological: Negative.   Endo/Heme/Allergies: Negative.   Psychiatric/Behavioral: Positive for hallucinations.   Complains of akisthisia symptoms

## 2013-09-11 NOTE — ED Provider Notes (Signed)
CSN: [id number]     Arrival date & time 09/11/13  1233 History  This chart was scribed for non-physician practitioner, Cleatrice Burke, PA-C working with Threasa Beards, MD by Frederich Balding, ED scribe. This patient was seen in room WTR3/WLPT3 and the patient's care was started at 2:00 PM.   Chief Complaint  Patient presents with  . Medical Clearance  . Alcohol Problem   The history is provided by the patient. No language interpreter was used.   HPI Comments: [name] is a 58 y.o. male with history of hep C and diabetes who presents to the Emergency Department for medical clearance. Pt states he has an alcohol problem and is requesting detox. He drinks about a case of beer per day. His last drink was around 11 AM today and drank a few 40 oz beers. Pt was here on 08/24/13 for alcohol detox and is unsure why he was unsuccessful in quitting, but states this time he is serious. The last time he was sober was a few months ago for 6-7 months. He admits to marijuana and cocaine use. Pt smokes one pack of cigarettes per day. Denies SI/HI. Pt has never had a seizure from alcohol withdrawal.   Past Medical History  Diagnosis Date  . Hepatitis C   . Diabetes mellitus   . Chronic hyponatremia     pt denies having   . Alcoholic   . Alcoholic cirrhosis of liver     Pt calls this a false diagnosis. Pt denies  . Thrombocytopenia   . Hyperglycemia    Past Surgical History  Procedure Laterality Date  . Plate in lower right leg  2008   Family History  Problem Relation Age of Onset  . ALS Father    History  Substance Use Topics  . Smoking status: Current Every Day Smoker -- 1.00 packs/day for 40 years    Types: Cigarettes  . Smokeless tobacco: Never Used  . Alcohol Use: 9.0 oz/week    15 Cans of beer per week     Comment: 18/daily    Review of Systems  Respiratory: Negative for shortness of breath.   Cardiovascular: Negative for chest pain.  Psychiatric/Behavioral: Positive for  behavioral problems. Negative for suicidal ideas.  All other systems reviewed and are negative.  Allergies  Benadryl; Librium; and Sulfa antibiotics  Home Medications   Prior to Admission medications   Medication Sig Start Date End Date Taking? Authorizing Provider  metFORMIN (GLUCOPHAGE) 500 MG tablet Take 1 tablet (500 mg total) by mouth 2 (two) times daily with a meal. 06/24/13  Yes Elmarie Shiley, NP   BP 138/83  Pulse 91  Temp(Src) 97.8 F (36.6 C) (Oral)  Resp 16  Ht 6' (1.829 m)  Wt 165 lb (74.844 kg)  BMI 22.37 kg/m2  SpO2 96%  Physical Exam  Nursing note and vitals reviewed. Constitutional: He is oriented to person, place, and time. He appears well-developed and well-nourished. No distress.  NAD  HENT:  Head: Normocephalic and atraumatic.  Right Ear: External ear normal.  Left Ear: External ear normal.  Nose: Nose normal.  Eyes: Conjunctivae are normal. Pupils are equal, round, and reactive to light.  Neck: Normal range of motion. No tracheal deviation present.  Cardiovascular: Normal rate, regular rhythm and normal heart sounds.   Pulmonary/Chest: Effort normal and breath sounds normal. No stridor.  Abdominal: Soft. He exhibits no distension. There is no tenderness.  Musculoskeletal: Normal range of motion.  Neurological: He is alert  and oriented to person, place, and time.  Patient is not currently tremulous  Skin: Skin is warm and dry. He is not diaphoretic.  Psychiatric: He has a normal mood and affect. His behavior is normal. He expresses no homicidal and no suicidal ideation.    ED Course  Procedures (including critical care time)  DIAGNOSTIC STUDIES: Oxygen Saturation is 96% on RA, normal by my interpretation.    COORDINATION OF CARE: 2:03 PM-Discussed treatment plan which includes lab work and moving to the back with pt at bedside and pt agreed to plan.   Labs Review Labs Reviewed  CBC WITH DIFFERENTIAL - Abnormal; Notable for the following:    MCH  35.8 (*)    MCHC 36.7 (*)    Platelets 123 (*)    All other components within normal limits  COMPREHENSIVE METABOLIC PANEL - Abnormal; Notable for the following:    Sodium 131 (*)    Chloride 92 (*)    Glucose, Bld 323 (*)    AST 86 (*)    ALT 68 (*)    All other components within normal limits  ETHANOL - Abnormal; Notable for the following:    Alcohol, Ethyl (B) 233 (*)    All other components within normal limits  URINE RAPID DRUG SCREEN (HOSP PERFORMED)    Imaging Review No results found.   EKG Interpretation None      MDM   Final diagnoses:  Alcohol dependence  S/P alcohol detoxification  Polysubstance abuse  Substance induced mood disorder   Patient presents to ED for evaluation of alcohol abuse. He drinks approximately a case a day. Denies DTs in the past. Also abuses THC and cocaine. Patient does qualify for inpatient detox. Patient is calm and cooperative at this time. Not tremulous. Started on CIWA protocol. Vital signs stable at this time. Patient is medically cleared.   I personally performed the services described in this documentation, which was scribed in my presence. The recorded information has been reviewed and is accurate.  Elwyn Lade, PA-C 09/11/13 [phone]

## 2013-09-11 NOTE — ED Notes (Signed)
Family at bedside.

## 2013-09-11 NOTE — ED Notes (Signed)
Dr. [name] notified of patient CBG. Dr is fine with patient transfer to [facility] with this value based on history.  Katharine Look RN at [facility] notified. Per Maudie Mercury RN, AC and [place] PA, CBG needs to be less than 250 for transfer.   Patient resting quietly in bed.

## 2013-09-11 NOTE — ED Notes (Signed)
Patient Wanded. Belongings given to Psych Staff

## 2013-09-12 ENCOUNTER — Inpatient Hospital Stay (HOSPITAL_COMMUNITY)
Admission: AD | Admit: 2013-09-12 | Discharge: 2013-09-16 | DRG: 897 | Disposition: A | Discharge status: Home / Self Care | Payer: Federal, State, Local not specified - Other | Source: Intra-hospital | Attending: Psychiatry | Admitting: Psychiatry

## 2013-09-12 ENCOUNTER — Encounter (HOSPITAL_COMMUNITY): Payer: Self-pay | Admitting: Behavioral Health

## 2013-09-12 DIAGNOSIS — K7030 Alcoholic cirrhosis of liver without ascites: Secondary | ICD-10-CM | POA: Diagnosis present

## 2013-09-12 DIAGNOSIS — F321 Major depressive disorder, single episode, moderate: Secondary | ICD-10-CM | POA: Diagnosis present

## 2013-09-12 DIAGNOSIS — F1994 Other psychoactive substance use, unspecified with psychoactive substance-induced mood disorder: Secondary | ICD-10-CM | POA: Diagnosis present

## 2013-09-12 DIAGNOSIS — F411 Generalized anxiety disorder: Secondary | ICD-10-CM | POA: Diagnosis present

## 2013-09-12 DIAGNOSIS — E119 Type 2 diabetes mellitus without complications: Secondary | ICD-10-CM | POA: Diagnosis present

## 2013-09-12 DIAGNOSIS — F10229 Alcohol dependence with intoxication, unspecified: Secondary | ICD-10-CM

## 2013-09-12 DIAGNOSIS — F1010 Alcohol abuse, uncomplicated: Secondary | ICD-10-CM

## 2013-09-12 DIAGNOSIS — F17200 Nicotine dependence, unspecified, uncomplicated: Secondary | ICD-10-CM | POA: Diagnosis present

## 2013-09-12 DIAGNOSIS — F1020 Alcohol dependence, uncomplicated: Principal | ICD-10-CM | POA: Diagnosis present

## 2013-09-12 DIAGNOSIS — G4700 Insomnia, unspecified: Secondary | ICD-10-CM | POA: Diagnosis present

## 2013-09-12 DIAGNOSIS — Z9289 Personal history of other medical treatment: Secondary | ICD-10-CM

## 2013-09-12 DIAGNOSIS — Z09 Encounter for follow-up examination after completed treatment for conditions other than malignant neoplasm: Secondary | ICD-10-CM

## 2013-09-12 LAB — GLUCOSE, CAPILLARY
GLUCOSE-CAPILLARY: 132 mg/dL — AB (ref 70–99)
Glucose-Capillary: 267 mg/dL — ABNORMAL HIGH (ref 70–99)

## 2013-09-12 LAB — CBG MONITORING, ED: GLUCOSE-CAPILLARY: 227 mg/dL — AB (ref 70–99)

## 2013-09-12 MED ORDER — VITAMIN B-1 100 MG PO TABS
100.0000 mg | ORAL_TABLET | Freq: Every day | ORAL | Status: DC
  Administered 2013-09-13 – 2013-09-16 (×4): 100 mg via ORAL
  Filled 2013-09-12 (×6): qty 1

## 2013-09-12 MED ORDER — METFORMIN HCL 500 MG PO TABS
1000.0000 mg | ORAL_TABLET | Freq: Two times a day (BID) | ORAL | Status: DC
  Administered 2013-09-12 – 2013-09-16 (×8): 1000 mg via ORAL
  Filled 2013-09-12 (×9): qty 2
  Filled 2013-09-12 (×2): qty 28
  Filled 2013-09-12 (×2): qty 2

## 2013-09-12 MED ORDER — LORAZEPAM 1 MG PO TABS
1.0000 mg | ORAL_TABLET | Freq: Three times a day (TID) | ORAL | Status: AC
  Administered 2013-09-13 (×3): 1 mg via ORAL
  Filled 2013-09-12 (×3): qty 1

## 2013-09-12 MED ORDER — INSULIN ASPART 100 UNIT/ML ~~LOC~~ SOLN
0.0000 [IU] | Freq: Three times a day (TID) | SUBCUTANEOUS | Status: DC
  Administered 2013-09-13: 3 [IU] via SUBCUTANEOUS
  Administered 2013-09-13 (×2): 4 [IU] via SUBCUTANEOUS
  Administered 2013-09-14: 3 [IU] via SUBCUTANEOUS
  Administered 2013-09-14 – 2013-09-15 (×3): 4 [IU] via SUBCUTANEOUS
  Administered 2013-09-15: 11 [IU] via SUBCUTANEOUS
  Administered 2013-09-15: 4 [IU] via SUBCUTANEOUS

## 2013-09-12 MED ORDER — HYDROXYZINE HCL 25 MG PO TABS
25.0000 mg | ORAL_TABLET | Freq: Four times a day (QID) | ORAL | Status: AC | PRN
  Administered 2013-09-12 – 2013-09-14 (×5): 25 mg via ORAL
  Filled 2013-09-12 (×6): qty 1

## 2013-09-12 MED ORDER — METFORMIN HCL 500 MG PO TABS
500.0000 mg | ORAL_TABLET | Freq: Two times a day (BID) | ORAL | Status: DC
  Administered 2013-09-12: 500 mg via ORAL
  Filled 2013-09-12 (×5): qty 1

## 2013-09-12 MED ORDER — IBUPROFEN 200 MG PO TABS
400.0000 mg | ORAL_TABLET | Freq: Four times a day (QID) | ORAL | Status: DC | PRN
  Administered 2013-09-12: 400 mg via ORAL
  Filled 2013-09-12 (×2): qty 2

## 2013-09-12 MED ORDER — INSULIN ASPART 100 UNIT/ML ~~LOC~~ SOLN
0.0000 [IU] | Freq: Every day | SUBCUTANEOUS | Status: DC
  Administered 2013-09-12: 4 [IU] via SUBCUTANEOUS

## 2013-09-12 MED ORDER — ONDANSETRON 4 MG PO TBDP
4.0000 mg | ORAL_TABLET | Freq: Four times a day (QID) | ORAL | Status: AC | PRN
  Filled 2013-09-12: qty 1

## 2013-09-12 MED ORDER — INSULIN GLARGINE 100 UNIT/ML ~~LOC~~ SOLN
8.0000 [IU] | Freq: Every day | SUBCUTANEOUS | Status: DC
  Administered 2013-09-12 – 2013-09-15 (×4): 8 [IU] via SUBCUTANEOUS

## 2013-09-12 MED ORDER — INSULIN ASPART 100 UNIT/ML ~~LOC~~ SOLN
6.0000 [IU] | Freq: Three times a day (TID) | SUBCUTANEOUS | Status: DC
Start: 2013-09-13 — End: 2013-09-16
  Administered 2013-09-13 – 2013-09-16 (×11): 6 [IU] via SUBCUTANEOUS

## 2013-09-12 MED ORDER — LORAZEPAM 1 MG PO TABS
1.0000 mg | ORAL_TABLET | Freq: Four times a day (QID) | ORAL | Status: DC | PRN
  Administered 2013-09-14 – 2013-09-16 (×4): 1 mg via ORAL
  Filled 2013-09-12 (×5): qty 1

## 2013-09-12 MED ORDER — INSULIN ASPART 100 UNIT/ML ~~LOC~~ SOLN
0.0000 [IU] | Freq: Three times a day (TID) | SUBCUTANEOUS | Status: DC
  Administered 2013-09-12: 8 [IU] via SUBCUTANEOUS
  Administered 2013-09-12: 2 [IU] via SUBCUTANEOUS

## 2013-09-12 MED ORDER — MAGNESIUM HYDROXIDE 400 MG/5ML PO SUSP
30.0000 mL | Freq: Every day | ORAL | Status: DC | PRN
  Administered 2013-09-16: 30 mL via ORAL

## 2013-09-12 MED ORDER — LORAZEPAM 1 MG PO TABS
1.0000 mg | ORAL_TABLET | Freq: Four times a day (QID) | ORAL | Status: AC
  Administered 2013-09-12 (×3): 1 mg via ORAL
  Filled 2013-09-12 (×3): qty 1

## 2013-09-12 MED ORDER — LOPERAMIDE HCL 2 MG PO CAPS: 2.0000 mg | ORAL_CAPSULE | ORAL | Status: AC | PRN

## 2013-09-12 MED ORDER — LORAZEPAM 1 MG PO TABS
1.0000 mg | ORAL_TABLET | Freq: Two times a day (BID) | ORAL | Status: AC
  Administered 2013-09-14 (×2): 1 mg via ORAL
  Filled 2013-09-12 (×2): qty 1

## 2013-09-12 MED ORDER — IBUPROFEN 600 MG PO TABS
600.0000 mg | ORAL_TABLET | Freq: Four times a day (QID) | ORAL | Status: DC | PRN
  Administered 2013-09-12 – 2013-09-15 (×7): 600 mg via ORAL
  Filled 2013-09-12 (×7): qty 1

## 2013-09-12 MED ORDER — ACETAMINOPHEN 325 MG PO TABS
650.0000 mg | ORAL_TABLET | Freq: Four times a day (QID) | ORAL | Status: DC | PRN
  Administered 2013-09-12: 650 mg via ORAL
  Filled 2013-09-12: qty 2

## 2013-09-12 MED ORDER — ADULT MULTIVITAMIN W/MINERALS CH
1.0000 | ORAL_TABLET | Freq: Every day | ORAL | Status: DC
  Administered 2013-09-12 – 2013-09-16 (×5): 1 via ORAL
  Filled 2013-09-12 (×8): qty 1

## 2013-09-12 MED ORDER — LORAZEPAM 1 MG PO TABS
1.0000 mg | ORAL_TABLET | Freq: Every day | ORAL | Status: AC
  Administered 2013-09-15: 1 mg via ORAL

## 2013-09-12 MED ORDER — INSULIN ASPART 100 UNIT/ML ~~LOC~~ SOLN
4.0000 [IU] | Freq: Three times a day (TID) | SUBCUTANEOUS | Status: DC
  Administered 2013-09-12 (×2): 4 [IU] via SUBCUTANEOUS

## 2013-09-12 NOTE — ED Provider Notes (Signed)
Medical screening examination/treatment/procedure(s) were performed by non-physician practitioner and as supervising physician I was immediately available for consultation/collaboration.   EKG Interpretation None       Threasa Beards, MD 09/12/13 1505

## 2013-09-12 NOTE — Consult Note (Signed)
  Psychiatric Specialty Exam: Physical Exam  ROS  Blood pressure 158/90, pulse 70, temperature 98 F (36.7 C), temperature source Oral, resp. rate 18, height 6' (1.829 m), weight 74.844 kg (165 lb), SpO2 95.00%.Body mass index is 22.37 kg/(m^2).  General Appearance: Well Groomed  Engineer, water::  Good  Speech:  Clear and Coherent  Volume:  Normal  Mood:  Depressed  Affect:  Appropriate  Thought Process:  Logical  Orientation:  Full (Time, Place, and Person)  Thought Content:  Negative  Suicidal Thoughts:  No  Homicidal Thoughts:  No  Memory:  Immediate;   Good Recent;   Good Remote;   Good  Judgement:  Intact  Insight:  Good  Psychomotor Activity:  Normal  Concentration:  Good  Recall:  Good  Akathisia:  Negative  Handed:  Right  AIMS (if indicated):     Assets:  Communication Skills Desire for Improvement  Sleep:   slept okay last night   Mr Cryer says he is still interested in a bed for detox. Blood sugar is down below 250 this am,  The plan is to transfer to [facility] for alcohol detox.
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
## 2013-09-12 NOTE — BHH Counselor (Addendum)
Pt going to bed 306-1. Support paperwork signed and faxed to [facility]. [employer] placed in pt's chart.   Pt's CBG was 227 at 8 am 09/12/13. Dr. Lovena Le says that pt's CBG is manageable and that he can now be transported.  Arnold Long, [place] Assessment Counselor

## 2013-09-12 NOTE — BHH Suicide Risk Assessment (Signed)
[place] INPATIENT: Family/Significant Other Suicide Prevention Education   Suicide Prevention Education:  Education Completed; No one has been identified by the patient as the family member/significant other with whom the patient will be residing, and identified as the person(s) who will aid the patient in the event of a mental health crisis (suicidal ideations/suicide attempt).   Pt did not c/o SI at admission, nor have they endorsed SI during their stay here. SPE not required. SPI pamphlet provided to pt and he was encouraged to share information with his support network, ask questions, and talk about any concerns.   The suicide prevention education provided includes the following:  Suicide risk factors  Suicide prevention and interventions  National Suicide Hotline telephone number  [facility] assessment telephone number  [facility] Emergency Assistance [place] and/or Residential Mobile Crisis Unit telephone number  [name] Lemming, [place] 09/12/2013  10:26 AM

## 2013-09-12 NOTE — BHH Counselor (Signed)
Adult Psychosocial Assessment Update Interdisciplinary Team  Previous [facility] admissions/discharges:  Admissions Discharges  Date: 06/19/13 Date: 06/25/13  Date: 11/22/12 Date: 11/28/12  Date: 09/27/12 Date: 09/29/12  Date: 01/29/12 Date: 02/02/12  Date: 12/21/11 Date: 12/25/11  Date: 11/06/11 Date: 11/11/11  Date: 01/30/11 Date: 02/05/11   Changes since the last Psychosocial Assessment (including adherence to outpatient mental health and/or substance abuse treatment, situational issues contributing to decompensation and/or relapse). Pt admitted to the hospital for alcohol detox. Pt reports drinking 24 beers daily and occasionally using marijuana and cocaine.  Pt was living in a hotel but had to move out due to not being able to afford it.  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  Pt has multiple medical issues due to his drinking.  Pt has been to numerous inpatient treatment facilities in the past.  On last admission pt refused follow up and was planning on moving to [place]. Pt was kicked out of an [employer] . Pt reports that he had not been following up at [facility] after d/c.              Discharge Plan 1. Will you be returning to the same living situation after discharge?   Yes: No:      If no, what is your plan?    Pt plans to return to friends home or [facility] and return to work.       2. Would you like a referral for services when you are discharged? Yes:     If yes, for what services?  No:       Pt plans to follow-up with [facility] and is not interested in any further referrals. Pt did not attend d/c planning group and is not invested in discussing d/c plan/aftercare planning with CSW.        Summary and Recommendations (to be completed by the evaluator) Patient is a 58 year old Caucasian Male with a diagnosis of Alcohol Use Disorder.  Patient lives in [place] ([place]  Patient will benefit from  crisis stabilization, medication evaluation, group therapy and psycho education in addition to case management for discharge planning. He plans to return to [place] or move in with friend at d/c and followup at [facility]. Pt minimally invested in d/c planning/aftercare at this time.                         Signature:  Cory Munch, 09/12/2013 10:36 AM

## 2013-09-12 NOTE — Progress Notes (Signed)
Admission note: Pt v/s taken, pt skin assessed, belongings searched, documents signed and writer explained to pt the policy here at [facility]. Report given to receiving RN, Adonis Huguenin. Per pt, he is here seeking detox for alcohol poison, before he kill himself drinking.

## 2013-09-12 NOTE — H&P (Signed)
Psychiatric Admission Assessment Adult  Patient Identification:  [name]  Date of Evaluation:  09/12/2013  Chief Complaint:  ALCOHOL DEPENDNECE  History of Present Illness: [name] is a 58 year old Caucasian male. He reports, "I went to the hospital ED yesterday. I believe I was having alcohol poisoning. I have been drinking heavily x 2 months, about 24-30 cans of beer daily. I know the motel room living is not a good idea. I have got to cope somehow, alcohol is the way to do it. I just need detox treatment. I don't want long term treatment. I have got to go back to work to pay back my fines. I'm on probation. I'm not suicidal. I'm just hurting, you know, cramping on my legs and arms"  Elements:  Location:  Alcoholism. Quality:  Cravings, Tremors, muscle cramps . Severity:  Severe, drinking 24 cans of beer daily x 2 months.. Timing:  Alcohol consumption excalated in the last 2 months. Duration:  [place]. Context:  "The motel room living is a bad idea, I have got to cope somehow, alcohol is the way".  Associated Signs/Synptoms: Depression Symptoms:  depressed mood, psychomotor agitation, feelings of worthlessness/guilt, hopelessness, insomnia, loss of energy/fatigue,  (Hypo) Manic Symptoms:  Impulsivity, Irritable Mood,  Anxiety Symptoms:  Excessive Worry,  Psychotic Symptoms:  Hallucinations: None  PTSD Symptoms: Had a traumatic exposure:  Denies  Total Time spent with patient: 1 hour  Psychiatric Specialty Exam: Physical Exam  Constitutional: He is oriented to person, place, and time. He appears well-developed.  HENT:  Head: Normocephalic.  Eyes: Pupils are equal, round, and reactive to light.  Neck: Normal range of motion.  Cardiovascular: Normal rate.   Respiratory: Effort normal.  GI: Soft.  Genitourinary:  Denies any issues in this area   Musculoskeletal: Normal range of motion.  Neurological: He is alert and oriented to person, place, and time.   Skin: Skin is warm and dry.  Psychiatric: His speech is normal and behavior is normal. Judgment and thought content normal. His mood appears anxious. Cognition and memory are normal. He exhibits a depressed mood.    Review of Systems  Constitutional: Positive for chills, malaise/fatigue and diaphoresis.  Eyes: Negative.   Respiratory: Negative.   Cardiovascular: Negative.   Gastrointestinal: Positive for nausea.  Genitourinary: Negative.   Musculoskeletal: Positive for myalgias.  Skin: Negative.   Neurological: Positive for dizziness, tremors, weakness and headaches.  Endo/Heme/Allergies: Negative.   Psychiatric/Behavioral: Positive for substance abuse (Alcoholism, chronic). Negative for suicidal ideas, hallucinations and memory loss. The patient is nervous/anxious and has insomnia.     There were no vitals taken for this visit.There is no weight on file to calculate BMI.  General Appearance: Disheveled and Guarded  Eye Contact::  Poor  Speech:  Clear and Coherent  Volume:  Normal  Mood:  Anxious and Depressed  Affect:  Restricted  Thought Process:  Coherent  Orientation:  Full (Time, Place, and Person)  Thought Content:  Rumination  Suicidal Thoughts:  No  Homicidal Thoughts:  No  Memory:  Immediate;   Good Recent;   Good Remote;   Fair  Judgement:  Impaired  Insight:  Lacking  Psychomotor Activity:  Restlessness and Tremor  Concentration:  Fair  Recall:  Good  Fund of Knowledge:Poor  Language: Fair  Akathisia:  No  Handed:  Right  AIMS (if indicated):     Assets:  Desire for Improvement  Sleep:       Musculoskeletal: Strength & Muscle Tone: within  normal limits Gait & Station: normal Patient leans: N/A  Past Psychiatric History: Diagnosis: Alcohol dependence, Substance induced mood disorder  Hospitalizations: [facility] adult unit  Outpatient Care: None reported  Substance Abuse Care: Does not want   Self-Mutilation: Denies  Suicidal Attempts: Denies  Violent  Behaviors: Denies   Past Medical History:   Past Medical History  Diagnosis Date  . Hepatitis C   . Diabetes mellitus   . Chronic hyponatremia     pt denies having   . Alcoholic   . Alcoholic cirrhosis of liver     Pt calls this a false diagnosis. Pt denies  . Thrombocytopenia   . Hyperglycemia    None.  Allergies:   Allergies  Allergen Reactions  . Benadryl [Diphenhydramine Hcl]     "Causes nervousness"  . Librium [Chlordiazepoxide Hcl] Anxiety  . Sulfa Antibiotics Rash   PTA Medications: Prescriptions prior to admission  Medication Sig Dispense Refill  . metFORMIN (GLUCOPHAGE) 500 MG tablet Take 1 tablet (500 mg total) by mouth 2 (two) times daily with a meal.       Previous Psychotropic Medications: Medication/Dose  Methformin               Substance Abuse History in the last 12 months:  yes  Consequences of Substance Abuse: Medical Consequences:  Liver damage, Possible death by overdose Legal Consequences:  Arrests, jail time, Loss of driving privilege. Family Consequences:  Family discord, divorce and or separation.  Social History:  reports that he has been smoking Cigarettes.  He has a 40 pack-year smoking history. He has never used smokeless tobacco. He reports that he drinks about 9 ounces of alcohol per week. He reports that he does not use illicit drugs.  Additional Social History:  Current Place of Residence: [place], [place] of Birth: [place], [place]   Family Members: "My 3 grown boys"   Marital Status: Single  Children: 3  Sons: 3  Daughters: 0   Relationships: Single   Education: [employer] Tour [employer] Problems/Performance: Completed high school   Religious Beliefs/Practices: None reported   History of Abuse (Emotional/Phsycial/Sexual): None reported   Occupational Experiences: Employed   Nature conservation officer History: None.   Legal History: Hx DUI, none pending at this time.   Hobbies/Interests: None reported  Family History:    Family History  Problem Relation Age of Onset  . ALS Father     Results for orders placed during the hospital encounter of 09/11/13 (from the past 72 hour(s))  CBC WITH DIFFERENTIAL     Status: Abnormal   Collection Time    09/11/13  2:10 PM      Result Value Ref Range   WBC 7.5  4.0 - 10.5 K/uL   RBC 4.27  4.22 - 5.81 MIL/uL   Hemoglobin 15.3  13.0 - 17.0 g/dL   HCT 41.7  39.0 - 52.0 %   MCV 97.7  78.0 - 100.0 fL   MCH 35.8 (*) 26.0 - 34.0 pg   MCHC 36.7 (*) 30.0 - 36.0 g/dL   RDW 13.0  11.5 - 15.5 %   Platelets 123 (*) 150 - 400 K/uL   Neutrophils Relative % 64  43 - 77 %   Neutro Abs 4.8  1.7 - 7.7 K/uL   Lymphocytes Relative 27  12 - 46 %   Lymphs Abs 2.0  0.7 - 4.0 K/uL   Monocytes Relative 7  3 - 12 %   Monocytes Absolute 0.5  0.1 -  1.0 K/uL   Eosinophils Relative 1  0 - 5 %   Eosinophils Absolute 0.1  0.0 - 0.7 K/uL   Basophils Relative 1  0 - 1 %   Basophils Absolute 0.0  0.0 - 0.1 K/uL  COMPREHENSIVE METABOLIC PANEL     Status: Abnormal   Collection Time    09/11/13  2:10 PM      Result Value Ref Range   Sodium 131 (*) 137 - 147 mEq/L   Potassium 4.7  3.7 - 5.3 mEq/L   Chloride 92 (*) 96 - 112 mEq/L   CO2 25  19 - 32 mEq/L   Glucose, Bld 323 (*) 70 - 99 mg/dL   BUN 8  6 - 23 mg/dL   Creatinine, Ser 0.56  0.50 - 1.35 mg/dL   Calcium 9.0  8.4 - 10.5 mg/dL   Total Protein 8.1  6.0 - 8.3 g/dL   Albumin 3.6  3.5 - 5.2 g/dL   AST 86 (*) 0 - 37 U/L   ALT 68 (*) 0 - 53 U/L   Alkaline Phosphatase 111  39 - 117 U/L   Total Bilirubin 0.4  0.3 - 1.2 mg/dL   GFR calc non Af Amer >90  >90 mL/min   GFR calc Af Amer >90  >90 mL/min   Comment: (NOTE)     The eGFR has been calculated using the CKD EPI equation.     This calculation has not been validated in all clinical situations.     eGFR's persistently <90 mL/min signify possible Chronic Kidney     Disease.  ETHANOL     Status: Abnormal   Collection Time    09/11/13  2:10 PM      Result Value Ref Range   Alcohol,  Ethyl (B) 233 (*) 0 - 11 mg/dL   Comment:            LOWEST DETECTABLE LIMIT FOR     SERUM ALCOHOL IS 11 mg/dL     FOR MEDICAL PURPOSES ONLY  URINE RAPID DRUG SCREEN (HOSP PERFORMED)     Status: None   Collection Time    09/11/13  2:16 PM      Result Value Ref Range   Opiates NONE DETECTED  NONE DETECTED   Cocaine NONE DETECTED  NONE DETECTED   Benzodiazepines NONE DETECTED  NONE DETECTED   Amphetamines NONE DETECTED  NONE DETECTED   Tetrahydrocannabinol NONE DETECTED  NONE DETECTED   Barbiturates NONE DETECTED  NONE DETECTED   Comment:            DRUG SCREEN FOR MEDICAL PURPOSES     ONLY.  IF CONFIRMATION IS NEEDED     FOR ANY PURPOSE, NOTIFY LAB     WITHIN 5 DAYS.                LOWEST DETECTABLE LIMITS     FOR URINE DRUG SCREEN     Drug Class       Cutoff (ng/mL)     Amphetamine      1000     Barbiturate      200     Benzodiazepine   347     Tricyclics       425     Opiates          300     Cocaine          300     THC  50  CBG MONITORING, ED     Status: Abnormal   Collection Time    09/11/13  9:24 PM      Result Value Ref Range   Glucose-Capillary 336 (*) 70 - 99 mg/dL  CBG MONITORING, ED     Status: Abnormal   Collection Time    09/12/13  8:04 AM      Result Value Ref Range   Glucose-Capillary 227 (*) 70 - 99 mg/dL   Psychological Evaluations:  Assessment:   DSM5: Schizophrenia Disorders:  NA Obsessive-Compulsive Disorders:  NA Trauma-Stressor Disorders:  NA Substance/Addictive Disorders:  Alcohol Related Disorder - Severe (303.90) Depressive Disorders:  Substance induced mood disorder  AXIS I:  Alcohol dependence, Substance induced mood disorder AXIS II:  Deferred AXIS III:   Past Medical History  Diagnosis Date  . Hepatitis C   . Diabetes mellitus   . Chronic hyponatremia     pt denies having   . Alcoholic   . Alcoholic cirrhosis of liver     Pt calls this a false diagnosis. Pt denies  . Thrombocytopenia   . Hyperglycemia    AXIS  IV:  other psychosocial or environmental problems and Alcoholism, chronic AXIS V:  1-10 persistent dangerousness to self and others present  Treatment Plan/Recommendations: 1. Admit for crisis management and stabilization, estimated length of stay 3-5 days.  2. Medication management to reduce current symptoms to base line and improve the patient's overall level of functioning  3. Treat health problems as indicated.  4. Develop treatment plan to decrease risk of relapse upon discharge and the need for readmission.  5. Psycho-social education regarding relapse prevention and self care.  6. Health care follow up as needed for medical problems.  7. Review, reconcile, and reinstate any pertinent home medications for other health issues where appropriate. 8. Call for consults with hospitalist for any additional specialty patient care services as needed.  Treatment Plan Summary: Daily contact with patient to assess and evaluate symptoms and progress in treatment Medication management  Current Medications:  Current Facility-Administered Medications  Medication Dose Route Frequency Provider Last Rate Last Dose  . acetaminophen (TYLENOL) tablet 650 mg  650 mg Oral Q6H PRN Shuvon Rankin, NP      . insulin aspart (novoLOG) injection 0-15 Units  0-15 Units Subcutaneous TID WC Encarnacion Slates, NP      . insulin aspart (novoLOG) injection 4 Units  4 Units Subcutaneous TID WC Encarnacion Slates, NP      . magnesium hydroxide (MILK OF MAGNESIA) suspension 30 mL  30 mL Oral Daily PRN Shuvon Rankin, NP      . metFORMIN (GLUCOPHAGE) tablet 500 mg  500 mg Oral BID WC Shuvon Rankin, NP        Observation Level/Precautions:  15 minute checks  Laboratory:  Per ED   Psychotherapy: Group sessions, AA/NA meetings   Medications:  Ativan detox protocols  Consultations: As needed   Discharge Concerns: Maintaining sobriety   Estimated LOS: 2-4 days  Other:     I certify that inpatient services furnished can reasonably  be expected to improve the patient's condition.   Encarnacion Slates, PMHNP, FNp-BC 5/6/201511:24 AM  Personally evaluated the patient reviewed the physical exam and labs and agree with assessment and plan Geralyn Flash A. [name], M.D.

## 2013-09-12 NOTE — Consult Note (Signed)
Face to face evaluation and I agree with this note

## 2013-09-12 NOTE — Tx Team (Signed)
Initial Interdisciplinary Treatment Plan  PATIENT STRENGTHS: (choose at least two) Ability for insight Capable of independent living Motivation for treatment/growth  PATIENT STRESSORS: Financial difficulties Legal issue Substance abuse   PROBLEM LIST: Problem List/Patient Goals Date to be addressed Date deferred Reason deferred Estimated date of resolution  Substance abuse 09/12/13     Homeless 09/12/13                                                DISCHARGE CRITERIA:  Ability to meet basic life and health needs Adequate post-discharge living arrangements Improved stabilization in mood, thinking, and/or behavior Verbal commitment to aftercare and medication compliance  PRELIMINARY DISCHARGE PLAN: Attend aftercare/continuing care group Attend PHP/IOP  PATIENT/FAMIILY INVOLVEMENT: This treatment plan has been presented to and reviewed with the patient, [name], and/or family member.  The patient and family have been given the opportunity to ask questions and make suggestions.  Kalmen Lollar L Austin Pongratz 09/12/2013, 12:22 PM

## 2013-09-12 NOTE — Tx Team (Signed)
Interdisciplinary Treatment Plan Update (Adult)  Date: 09/12/2013  Time Reviewed:  9:45 AM  Progress in Treatment: Attending groups: Yes Participating in groups:  Yes Taking medication as prescribed:  Yes Tolerating medication:  Yes Family/Significant othe contact made: CSW assessing Patient understands diagnosis:  Yes Discussing patient identified problems/goals with staff:  Yes Medical problems stabilized or resolved:  Yes Denies suicidal/homicidal ideation: Yes Issues/concerns per patient self-inventory:  Yes Other:  New problem(s) identified: N/A  Discharge Plan or Barriers: CSW assessing for appropriate referrals.    Reason for Continuation of Hospitalization: Anxiety Depression Detox Medication Stabilization  Comments: N/A  Estimated length of stay: 3-5 days  For review of initial/current patient goals, please see plan of care.  Attendees: Patient:     Family:     Physician:  Dr. [name] 09/12/2013 10:21 AM   Nursing:   Para March, RN 09/12/2013 10:21 AM   Clinical Social Worker:  [name] Lemming, LCSW 09/12/2013 10:21 AM   Other: Mayra Neer, RN 09/12/2013 10:21 AM   Other:  Maxie Better, [place] 09/12/2013 10:21 AM   Other:  Agustina Caroli, NP 09/12/2013 10:21 AM   Other:  Beather Arbour, [facility] 09/12/2013 10:22 AM   Other: York Pellant - [employer] 09/12/2013 10:22 AM   Other:    Other:    Other:    Other:    Other:     Scribe for Treatment Team:   Ane Payment, 09/12/2013 , 10:21 AM

## 2013-09-12 NOTE — [facility] Group Notes (Signed)
[place] LCSW Group Therapy  09/12/2013 1:15 PM   Type of Therapy:  Group Therapy  Participation Level:  Did Not Attend - pt sleeping in his room  [name] Lemming, [place] 09/12/2013 2:25 PM

## 2013-09-12 NOTE — Progress Notes (Signed)
Did not attended group

## 2013-09-13 LAB — GLUCOSE, CAPILLARY
GLUCOSE-CAPILLARY: 182 mg/dL — AB (ref 70–99)
Glucose-Capillary: 322 mg/dL — ABNORMAL HIGH (ref 70–99)
Glucose-Capillary: 185 mg/dL — ABNORMAL HIGH (ref 70–99)
Glucose-Capillary: 123 mg/dL — ABNORMAL HIGH (ref 70–99)
Glucose-Capillary: 130 mg/dL — ABNORMAL HIGH (ref 70–99)

## 2013-09-13 LAB — HEMOGLOBIN A1C
Hgb A1c MFr Bld: 9.6 % — ABNORMAL HIGH (ref <5.7)
Mean Plasma Glucose: 229 mg/dL — ABNORMAL HIGH (ref <117)

## 2013-09-13 NOTE — Progress Notes (Signed)
Patient did not attend the evening karaoke group. Pt was notified that group was beginning but pt remained on unit in bed.

## 2013-09-13 NOTE — [facility] Group Notes (Signed)
[place] LCSW Group Therapy  09/13/2013 2:46 PM  Type of Therapy:  Group Therapy  Participation Level:  Did Not Attend-pt was sleeping/did not attend group.    [name] Smart LCSWA  09/13/2013, 2:46 PM

## 2013-09-13 NOTE — Progress Notes (Signed)
D: Pt denies SI/HI/AVh. Pt is pleasant and cooperative. Pt stated he will probably be done with de-tox tomorrow. Tonight pt continues to lay in bed and not forward much with an irritable attitude.  A: Pt was offered support and encouragement. Pt was given scheduled medications. Pt was encourage to attend groups. Q 15 minute checks were done for safety.   R:Pt is taking medication.Pt receptive to treatment and safety maintained on unit.

## 2013-09-13 NOTE — Progress Notes (Signed)
0900 nursing orientation group  The focus of this group is to educate the patient on the purpose and policies of crisis stabilization and provide a format to answer questions about their admission.  The group details unit policies and expectations of patients while admitted.   Pt did not attend.

## 2013-09-13 NOTE — Progress Notes (Addendum)
Pt complaining of pain in L-lower rib area. Pt does not appear to be in distress. Pt requested heat packs and they were given to pt.

## 2013-09-13 NOTE — Progress Notes (Signed)
Pt has been in bed most of the day he will not go down for meals so his carb modified diet has been giving to him in his room. His CBG 185 at noon and he received 4 units SS and 6 units for meal coverage.  He rated his depression and anxiety a 4 and denied hopelessness on his self-inventory.  He denies any S/H ideation or A/V/H.  He is unsure of where he will f/u at this time.

## 2013-09-13 NOTE — Progress Notes (Signed)
Pt woke up demanding ativan. Pt stated we should remove pt out of the room because he was snoring. Pt stated he could not wear earplugs because they were uncomfortable. then pt stated they do not work. Pt demanding, agitated, pt stated he could not sleep in the room with his roommate

## 2013-09-13 NOTE — Progress Notes (Signed)
[facility] MD Progress Note  09/13/2013 3:52 PM [name]  MRN:  [id number] Subjective:  [name] continues to have a hard time. He is in bed states he is also dealing with pain in his side (ribs) He wants to be detox and be able to go back to work Diagnosis:   DSM5: Schizophrenia Disorders:  none Obsessive-Compulsive Disorders:  none Trauma-Stressor Disorders:  none Substance/Addictive Disorders:  Alcohol Related Disorder - Severe (303.90) Depressive Disorders:  Major Depressive Disorder - Moderate (296.22) Total Time spent with patient: 30 minutes  Axis I: Substance Induced Mood Disorder  ADL's:  Intact  Sleep: Poor  Appetite:  Poor  Suicidal Ideation:  Plan:  denies Intent:  denies Means:  denies Homicidal Ideation:  Plan:  denies Intent:  denies Means:  denies AEB (as evidenced by):  Psychiatric Specialty Exam: Physical Exam  Review of Systems  Constitutional: Positive for malaise/fatigue.  HENT: Negative.   Eyes: Negative.   Respiratory: Negative.   Cardiovascular: Negative.   Gastrointestinal: Positive for nausea.  Genitourinary: Negative.   Musculoskeletal: Positive for myalgias.  Skin: Negative.   Neurological: Positive for tremors and weakness.  Endo/Heme/Allergies: Negative.   Psychiatric/Behavioral: Positive for depression and substance abuse. The patient is nervous/anxious.     Blood pressure 112/76, pulse 102, temperature 97.2 F (36.2 C), temperature source Oral, resp. rate 18, height 6' (1.829 m), weight 70.761 kg (156 lb).Body mass index is 21.15 kg/(m^2).  General Appearance: Disheveled  Eye Contact::  Minimal  Speech:  Clear and Coherent and not spontaneous  Volume:  fluctuates  Mood:  Anxious and worried  Affect:  anxious, worried  Thought Process:  Coherent and Goal Directed  Orientation:  Full (Time, Place, and Person)  Thought Content:  symptoms, owrries, concerns  Suicidal Thoughts:  No  Homicidal Thoughts:  No  Memory:  Immediate;    Fair Recent;   Fair Remote;   Fair  Judgement:  Fair  Insight:  Present  Psychomotor Activity:  Restlessness  Concentration:  Fair  Recall:  [employer] of Knowledge:NA  Language: Fair  Akathisia:  No  Handed:    AIMS (if indicated):     Assets:  Desire for Improvement  Sleep:      Musculoskeletal: Strength & Muscle Tone: within normal limits Gait & Station: normal Patient leans: N/A  Current Medications: Current Facility-Administered Medications  Medication Dose Route Frequency Provider Last Rate Last Dose  . acetaminophen (TYLENOL) tablet 650 mg  650 mg Oral Q6H PRN Shuvon Rankin, NP   650 mg at 09/12/13 1644  . hydrOXYzine (ATARAX/VISTARIL) tablet 25 mg  25 mg Oral Q6H PRN Nicholaus Bloom, MD   25 mg at 09/13/13 0604  . ibuprofen (ADVIL,MOTRIN) tablet 600 mg  600 mg Oral Q6H PRN Laverle Hobby, PA-C   600 mg at 09/13/13 4098  . insulin aspart (novoLOG) injection 0-20 Units  0-20 Units Subcutaneous TID WC Laverle Hobby, PA-C   4 Units at 09/13/13 1219  . insulin aspart (novoLOG) injection 0-5 Units  0-5 Units Subcutaneous QHS Laverle Hobby, PA-C   4 Units at 09/12/13 2217  . insulin aspart (novoLOG) injection 6 Units  6 Units Subcutaneous TID WC Laverle Hobby, PA-C   6 Units at 09/13/13 1220  . insulin glargine (LANTUS) injection 8 Units  8 Units Subcutaneous QHS Laverle Hobby, PA-C   8 Units at 09/12/13 2218  . loperamide (IMODIUM) capsule 2-4 mg  2-4 mg Oral PRN Nicholaus Bloom, MD      .  LORazepam (ATIVAN) tablet 1 mg  1 mg Oral TID Nicholaus Bloom, MD   1 mg at 09/13/13 1203   Followed by  . [START ON 09/14/2013] LORazepam (ATIVAN) tablet 1 mg  1 mg Oral BID Nicholaus Bloom, MD       Followed by  . [START ON 09/15/2013] LORazepam (ATIVAN) tablet 1 mg  1 mg Oral Daily Nicholaus Bloom, MD      . LORazepam (ATIVAN) tablet 1 mg  1 mg Oral Q6H PRN Nicholaus Bloom, MD      . magnesium hydroxide (MILK OF MAGNESIA) suspension 30 mL  30 mL Oral Daily PRN Shuvon Rankin, NP      . metFORMIN  (GLUCOPHAGE) tablet 1,000 mg  1,000 mg Oral BID WC Laverle Hobby, PA-C   1,000 mg at 09/13/13 0806  . multivitamin with minerals tablet 1 tablet  1 tablet Oral Daily Nicholaus Bloom, MD   1 tablet at 09/13/13 [phone]  . ondansetron (ZOFRAN-ODT) disintegrating tablet 4 mg  4 mg Oral Q6H PRN Nicholaus Bloom, MD      . thiamine (VITAMIN B-1) tablet 100 mg  100 mg Oral Daily Nicholaus Bloom, MD   100 mg at 09/13/13 1914    Lab Results:  Results for orders placed during the hospital encounter of 09/12/13 (from the past 48 hour(s))  GLUCOSE, CAPILLARY     Status: Abnormal   Collection Time    09/12/13 12:22 PM      Result Value Ref Range   Glucose-Capillary 267 (*) 70 - 99 mg/dL  GLUCOSE, CAPILLARY     Status: Abnormal   Collection Time    09/12/13  5:16 PM      Result Value Ref Range   Glucose-Capillary 132 (*) 70 - 99 mg/dL  GLUCOSE, CAPILLARY     Status: Abnormal   Collection Time    09/12/13  9:40 PM      Result Value Ref Range   Glucose-Capillary 322 (*) 70 - 99 mg/dL  GLUCOSE, CAPILLARY     Status: Abnormal   Collection Time    09/13/13  6:08 AM      Result Value Ref Range   Glucose-Capillary 182 (*) 70 - 99 mg/dL  GLUCOSE, CAPILLARY     Status: Abnormal   Collection Time    09/13/13 12:13 PM      Result Value Ref Range   Glucose-Capillary 185 (*) 70 - 99 mg/dL    Physical Findings: AIMS: Facial and Oral Movements Muscles of Facial Expression: None, normal Lips and Perioral Area: None, normal Jaw: None, normal Tongue: None, normal,Extremity Movements Upper (arms, wrists, hands, fingers): None, normal Lower (legs, knees, ankles, toes): None, normal, Trunk Movements Neck, shoulders, hips: None, normal, Overall Severity Severity of abnormal movements (highest score from questions above): None, normal Incapacitation due to abnormal movements: None, normal Patient's awareness of abnormal movements (rate only patient's report): No Awareness, Dental Status Current problems with teeth  and/or dentures?: No Does patient usually wear dentures?: No  CIWA:  CIWA-Ar Total: 7 COWS:     Treatment Plan Summary: Daily contact with patient to assess and evaluate symptoms and progress in treatment Medication management  Plan: Supportive approach/coping skills/relapse prevention           Continue detox Medical Decision Making Problem Points:  Established problem, worsening (2) Data Points:  Review of new medications or change in dosage (2)  I certify that inpatient services furnished can reasonably be expected  to improve the patient's condition.   Nicholaus Bloom 09/13/2013, 3:52 PM

## 2013-09-14 ENCOUNTER — Inpatient Hospital Stay (HOSPITAL_COMMUNITY): Payer: Federal, State, Local not specified - Other

## 2013-09-14 LAB — GLUCOSE, CAPILLARY
GLUCOSE-CAPILLARY: 140 mg/dL — AB (ref 70–99)
GLUCOSE-CAPILLARY: 160 mg/dL — AB (ref 70–99)
GLUCOSE-CAPILLARY: 176 mg/dL — AB (ref 70–99)
Glucose-Capillary: 169 mg/dL — ABNORMAL HIGH (ref 70–99)

## 2013-09-14 NOTE — Progress Notes (Signed)
Adult Psychoeducational Group Note  Date:  09/14/2013 Time:  2:48 PM  Group Topic/Focus:  Early Warning Signs:   The focus of this group is to help patients identify signs or symptoms they exhibit before slipping into an unhealthy state or crisis.  Participation Level:  Did Not Attend  Additional Comments:  Pt was in bed at the time of group.  Clint Bolder 09/14/2013, 2:48 PM

## 2013-09-14 NOTE — B[facility] Group Notes (Signed)
[place] LCSW Group Therapy  09/14/2013 3:33 PM  Type of Therapy:  Group Therapy  Participation Level:  Did Not Attend-pt in room/did not attend therapy group.   Junita Kubota Smart LCSWA  09/14/2013, 3:33 PM

## 2013-09-14 NOTE — [facility] Group Notes (Signed)
Adult Psychoeducational Group Note  Date:  09/14/2013 Time:  10:27 PM  Group Topic/Focus:  AA Meeting  Participation Level:  Active  Participation Quality:  Appropriate  Affect:  Flat  Cognitive:  Appropriate  Insight: Appropriate  Engagement in Group:  Engaged  Modes of Intervention:  Discussion and Education  Additional Comments:  [name] attended group and was engaged.  [name] Comment 09/14/2013, 10:27 PM

## 2013-09-14 NOTE — Progress Notes (Addendum)
Pt was given 623m of motrin for rib pain a 7/10 . Pt denies feeling short of breath and states he only has pain with movement. Pt also requested visteral for his nerves. He was also given this at 1344. 3:50pm -Pt will go across the street for bilateral rib x-rays due to an injury a few days ago [facility] and awaiting their arrival. Pt made aware he will go for x-rays of his ribs per Dr. LSabra Heck Pt was accompanied to the x-ray dept with a tech, [employer] Pt did appear in good spirits and left for the x-ray dept at 4:25pm.  5:20pm Pt FSBS was 169 and he was covered with 4 units of novolgu and given his standing dose of 6 units of novologu.5:25pm-Pts pulse ox is 99% on room air and pt does not appear in any resp. Distress. Presently he is working on a puzzle with another pt.

## 2013-09-14 NOTE — Progress Notes (Signed)
D) Pt states that when he comes in the hospital he "sleeps it off. Then I leave. I am just not able to get up and attend the groups. I feel too bad". Rates his depression and hopelessness both at a 5 and denies SI and HI. A) Given support and encouraged to come to groups, not pushing too much. Encouraged to drink fluids. R) Denies SI and HI

## 2013-09-14 NOTE — Progress Notes (Signed)
[facility] MD Progress Note  09/14/2013 3:35 PM [name]  MRN:  [id number] Subjective:  [name] is in bed. States he needs to find work when he gets out. Admits he has been drinking heavily. He fell and feels he bruised his ribs on his left side. Still with pain Diagnosis:   DSM5: Schizophrenia Disorders:  none Obsessive-Compulsive Disorders:  none Trauma-Stressor Disorders:  none Substance/Addictive Disorders:  Alcohol Related Disorder - Severe (303.90) Depressive Disorders:  Major Depressive Disorder - Moderate (296.22) Total Time spent with patient: 30 minutes  Axis I: Substance Induced Mood Disorder  ADL's:  Intact  Sleep: Poor  Appetite:  Fair  Suicidal Ideation:  Plan:  denies Intent:  denies Means:  denies Homicidal Ideation:  Plan:  denies Intent:  denies Means:  denies AEB (as evidenced by):  Psychiatric Specialty Exam: Physical Exam  Review of Systems  HENT: Negative.   Eyes: Negative.   Respiratory: Negative.   Cardiovascular: Negative.   Gastrointestinal: Negative.   Genitourinary: Negative.   Musculoskeletal:       Side pain  Skin: Negative.   Neurological: Positive for weakness.  Endo/Heme/Allergies: Negative.   Psychiatric/Behavioral: Positive for substance abuse. The patient is nervous/anxious.     Blood pressure 166/84, pulse 84, temperature 97.2 F (36.2 C), temperature source Oral, resp. rate 16, height 6' (1.829 m), weight 70.761 kg (156 lb).Body mass index is 21.15 kg/(m^2).  General Appearance: Disheveled  Eye Sport and exercise psychologist::  Fair  Speech:  Clear and Coherent  Volume:  Decreased  Mood:  Anxious and worried, persisitent pain left chest  Affect:  Restricted  Thought Process:  Coherent and Goal Directed  Orientation:  Full (Time, Place, and Person)  Thought Content:  symtpoms, worries, concerns  Suicidal Thoughts:  No  Homicidal Thoughts:  No  Memory:  Immediate;   Fair Recent;   Fair Remote;   Fair  Judgement:  Fair  Insight:  Shallow   Psychomotor Activity:  Restlessness  Concentration:  Fair  Recall:  [employer] of Knowledge:NA  Language: Fair  Akathisia:  No  Handed:    AIMS (if indicated):     Assets:  Desire for Improvement  Sleep:  Number of Hours: 4.5   Musculoskeletal: Strength & Muscle Tone: within normal limits Gait & Station: normal Patient leans: N/A  Current Medications: Current Facility-Administered Medications  Medication Dose Route Frequency Provider Last Rate Last Dose  . acetaminophen (TYLENOL) tablet 650 mg  650 mg Oral Q6H PRN Shuvon Rankin, NP   650 mg at 09/12/13 1644  . hydrOXYzine (ATARAX/VISTARIL) tablet 25 mg  25 mg Oral Q6H PRN Nicholaus Bloom, MD   25 mg at 09/14/13 1331  . ibuprofen (ADVIL,MOTRIN) tablet 600 mg  600 mg Oral Q6H PRN Laverle Hobby, PA-C   600 mg at 09/14/13 1329  . insulin aspart (novoLOG) injection 0-20 Units  0-20 Units Subcutaneous TID WC Laverle Hobby, PA-C   4 Units at 09/14/13 1215  . insulin aspart (novoLOG) injection 0-5 Units  0-5 Units Subcutaneous QHS Laverle Hobby, PA-C   4 Units at 09/12/13 2217  . insulin aspart (novoLOG) injection 6 Units  6 Units Subcutaneous TID WC Laverle Hobby, PA-C   6 Units at 09/14/13 1215  . insulin glargine (LANTUS) injection 8 Units  8 Units Subcutaneous QHS Laverle Hobby, PA-C   8 Units at 09/13/13 2206  . loperamide (IMODIUM) capsule 2-4 mg  2-4 mg Oral PRN Nicholaus Bloom, MD      .  LORazepam (ATIVAN) tablet 1 mg  1 mg Oral BID Nicholaus Bloom, MD   1 mg at 09/14/13 0844   Followed by  . [START ON 09/15/2013] LORazepam (ATIVAN) tablet 1 mg  1 mg Oral Daily Nicholaus Bloom, MD      . LORazepam (ATIVAN) tablet 1 mg  1 mg Oral Q6H PRN Nicholaus Bloom, MD      . magnesium hydroxide (MILK OF MAGNESIA) suspension 30 mL  30 mL Oral Daily PRN Shuvon Rankin, NP      . metFORMIN (GLUCOPHAGE) tablet 1,000 mg  1,000 mg Oral BID WC Laverle Hobby, PA-C   1,000 mg at 09/14/13 0844  . multivitamin with minerals tablet 1 tablet  1 tablet Oral  Daily Nicholaus Bloom, MD   1 tablet at 09/14/13 [phone]  . ondansetron (ZOFRAN-ODT) disintegrating tablet 4 mg  4 mg Oral Q6H PRN Nicholaus Bloom, MD      . thiamine (VITAMIN B-1) tablet 100 mg  100 mg Oral Daily Nicholaus Bloom, MD   100 mg at 09/14/13 2841    Lab Results:  Results for orders placed during the hospital encounter of 09/12/13 (from the past 48 hour(s))  GLUCOSE, CAPILLARY     Status: Abnormal   Collection Time    09/12/13  5:16 PM      Result Value Ref Range   Glucose-Capillary 132 (*) 70 - 99 mg/dL  GLUCOSE, CAPILLARY     Status: Abnormal   Collection Time    09/12/13  9:40 PM      Result Value Ref Range   Glucose-Capillary 322 (*) 70 - 99 mg/dL  GLUCOSE, CAPILLARY     Status: Abnormal   Collection Time    09/13/13  6:08 AM      Result Value Ref Range   Glucose-Capillary 182 (*) 70 - 99 mg/dL  HEMOGLOBIN A1C     Status: Abnormal   Collection Time    09/13/13  6:13 AM      Result Value Ref Range   Hemoglobin A1C 9.6 (*) <5.7 %   Comment: (NOTE)                                                                               According to the ADA Clinical Practice Recommendations for 2011, when     HbA1c is used as a screening test:      >=6.5%   Diagnostic of Diabetes Mellitus               (if abnormal result is confirmed)     5.7-6.4%   Increased risk of developing Diabetes Mellitus     References:Diagnosis and Classification of Diabetes Mellitus,Diabetes     LKGM,0102,72(ZDGUY 1):S62-S69 and Standards of Medical Care in             Diabetes - 2011,Diabetes Care,2011,34 (Suppl 1):S11-S61.   Mean Plasma Glucose 229 (*) <117 mg/dL   Comment: Performed at [place], CAPILLARY     Status: Abnormal   Collection Time    09/13/13 12:13 PM      Result Value Ref Range   Glucose-Capillary 185 (*) 70 - 99 mg/dL  GLUCOSE, CAPILLARY     Status: Abnormal   Collection Time    09/13/13  4:59 PM      Result Value Ref Range   Glucose-Capillary 123 (*) 70 - 99  mg/dL  GLUCOSE, CAPILLARY     Status: Abnormal   Collection Time    09/13/13  9:58 PM      Result Value Ref Range   Glucose-Capillary 130 (*) 70 - 99 mg/dL   Comment 1 Notify RN     Comment 2 Documented in Chart    GLUCOSE, CAPILLARY     Status: Abnormal   Collection Time    09/14/13  6:18 AM      Result Value Ref Range   Glucose-Capillary 140 (*) 70 - 99 mg/dL   Comment 1 Notify RN     Comment 2 Documented in Chart    GLUCOSE, CAPILLARY     Status: Abnormal   Collection Time    09/14/13 12:08 PM      Result Value Ref Range   Glucose-Capillary 160 (*) 70 - 99 mg/dL   Comment 1 Notify RN      Physical Findings: AIMS: Facial and Oral Movements Muscles of Facial Expression: None, normal Lips and Perioral Area: None, normal Jaw: None, normal Tongue: None, normal,Extremity Movements Upper (arms, wrists, hands, fingers): None, normal Lower (legs, knees, ankles, toes): None, normal, Trunk Movements Neck, shoulders, hips: None, normal, Overall Severity Severity of abnormal movements (highest score from questions above): None, normal Incapacitation due to abnormal movements: None, normal Patient's awareness of abnormal movements (rate only patient's report): No Awareness, Dental Status Current problems with teeth and/or dentures?: No Does patient usually wear dentures?: No  CIWA:  CIWA-Ar Total: 7 COWS:     Treatment Plan Summary: Daily contact with patient to assess and evaluate symptoms and progress in treatment Medication management  Plan: Supportive approach/coping skills/relapse prevention           Pursue detox           Will get X ray ribs  Medical Decision Making Problem Points:  Established problem, stable/improving (1), Established problem, worsening (2) and Review of psycho-social stressors (1) Data Points:  Review meds  I certify that inpatient services furnished can reasonably be expected to improve the patient's condition.   Nicholaus Bloom 09/14/2013, 3:35  PM

## 2013-09-14 NOTE — Progress Notes (Signed)
Patient ID: [name], male   DOB: 03/02/1956, 58 y.o.   MRN: [id number] Pt with minimal visibility in the milieu.  Appropriate interaction with staff and peers.  Pt reported he was feeling anxious related to possible d/c.  Fifteen minute checks in progress for patient safety.  Pt safe on unit.

## 2013-09-14 NOTE — [facility] Group Notes (Signed)
[facility] LCSW Aftercare Discharge Planning Group Note   09/14/2013 10:03 AM  Participation Quality:  DID NOT ATTEND-pt did not get out of bed to attend d/c planning group.   [place]

## 2013-09-15 DIAGNOSIS — F411 Generalized anxiety disorder: Secondary | ICD-10-CM

## 2013-09-15 LAB — GLUCOSE, CAPILLARY
GLUCOSE-CAPILLARY: 118 mg/dL — AB (ref 70–99)
Glucose-Capillary: 152 mg/dL — ABNORMAL HIGH (ref 70–99)
Glucose-Capillary: 164 mg/dL — ABNORMAL HIGH (ref 70–99)
Glucose-Capillary: 268 mg/dL — ABNORMAL HIGH (ref 70–99)

## 2013-09-15 MED ORDER — NICOTINE 21 MG/24HR TD PT24
21.0000 mg | MEDICATED_PATCH | Freq: Every day | TRANSDERMAL | Status: DC
  Administered 2013-09-15: 21 mg via TRANSDERMAL
  Filled 2013-09-15 (×4): qty 1

## 2013-09-15 NOTE — [facility] Suicide Risk Assessment (Signed)
Suicide Risk Assessment  Discharge Assessment     Demographic Factors:  Male and Caucasian  Total Time spent with patient: 45 minutes  Psychiatric Specialty Exam:     Blood pressure 153/95, pulse 76, temperature 97.3 F (36.3 C), temperature source Oral, resp. rate 18, height 6' (1.829 m), weight 70.761 kg (156 lb).Body mass index is 21.15 kg/(m^2).  General Appearance: Fairly Groomed  Engineer, water::  Fair  Speech:  Clear and Coherent  Volume:  Normal  Mood:  Anxious  Affect:  Appropriate  Thought Process:  Coherent and Goal Directed  Orientation:  Full (Time, Place, and Person)  Thought Content:  plans as he moves on, relapse prevention plan  Suicidal Thoughts:  No  Homicidal Thoughts:  No  Memory:  Immediate;   Fair Recent;   Fair Remote;   Fair  Judgement:  Fair  Insight:  Present  Psychomotor Activity:  Normal  Concentration:  Fair  Recall:  [employer] of Knowledge:NA  Language: Fair  Akathisia:  No  Handed:    AIMS (if indicated):     Assets:  Desire for Improvement Vocational/Educational  Sleep:  Number of Hours: 4.5    Musculoskeletal: Strength & Muscle Tone: within normal limits Gait & Station: normal Patient leans: N/A   Mental Status Per Nursing Assessment::   On Admission:  NA  Current Mental Status by Physician: In full contact with reality. There are no active S/S of withdrawal. He is willing and motivated to pursue further outpatient follow up. Want to be active with a clinic to better manage his diabetes. As states that he thinks lack of control triggers his alcohol use   Loss Factors: Decline in physical health  Historical Factors: NA  Risk Reduction Factors:   Employed  Continued Clinical Symptoms:  Alcohol/Substance Abuse/Dependencies  Cognitive Features That Contribute To Risk:  Closed-mindedness Polarized thinking Thought constriction (tunnel vision)    Suicide Risk:  Minimal: No identifiable suicidal ideation.  Patients  presenting with no risk factors but with morbid ruminations; may be classified as minimal risk based on the severity of the depressive symptoms  Discharge Diagnoses:   AXIS I:  Alcohol Dependence, Substance Induced Mood Disorder AXIS II:  No diagnosis AXIS III:   Past Medical History  Diagnosis Date  . Hepatitis C   . Diabetes mellitus   . Chronic hyponatremia     pt denies having   . Alcoholic   . Alcoholic cirrhosis of liver     Pt calls this a false diagnosis. Pt denies  . Thrombocytopenia   . Hyperglycemia    AXIS IV:  other psychosocial or environmental problems AXIS V:  61-70 mild symptoms  Plan Of Care/Follow-up recommendations:  Activity:  as tolerated Diet:  regular Follow up Outpatient basis Is patient on multiple antipsychotic therapies at discharge:  No   Has Patient had three or more failed trials of antipsychotic monotherapy by history:  No  Recommended Plan for Multiple Antipsychotic Therapies: NA    Nicholaus Bloom 09/15/2013, 3:42 PM

## 2013-09-15 NOTE — B[facility] Group Notes (Signed)
  [facility] LCSW Group Therapy Note  09/15/2013 /10:00 AM  Type of Therapy and Topic:  Group Therapy: Avoiding Self-Sabotaging and Enabling Behaviors  Participation Level:  Did Not Attend     Sheilah Pigeon, LCSW

## 2013-09-15 NOTE — Progress Notes (Signed)
Psychoeducational Group Note    Date: 09/15/2013 Time: 0930  Goal Setting Purpose of Group: To be able to set a goal that is measurable and that can be accomplished in one day Participation Level:  Did not attend [name]

## 2013-09-15 NOTE — Progress Notes (Signed)
D: Pt mood is depressed. He is concerned about being able to get his medications, especially insulin when he is discharged. Pt feels that he is ready to go back to work on Monday and looks forward to it. He interacts well within the milieu. A: Support given. Verbalization encouraged. Pt encouraged to come to staff with any concerns. R: Pt is receptive. No complaints of pain or discomfort at this time. Will continue to monitor pt. Q15 min safety checks maintained.

## 2013-09-15 NOTE — Progress Notes (Signed)
Psychoeducational Group Note  Date: 09/15/2013 Time:  1015  Group Topic/Focus:  Identifying Needs:   The focus of this group is to help patients identify their personal needs that have been historically problematic and identify healthy behaviors to address their needs.  Participation Level:  Active  Participation Quality:  Appropriate  Affect:  Appropriate  Cognitive:  Oriented  Insight:  Improving  Engagement in Group:  Engaged  Additional Comments:  Attending and participating fully in the group.  [name]

## 2013-09-15 NOTE — Progress Notes (Signed)
[facility] MD Progress Note  09/15/2013 3:31 PM [name]  MRN:  [id number] Subjective:  [name] is more active today. He states he is concerned about his BS control. He states that poor control triggers his drinking as he does not feel well. He would like to be on Lantus. Realizes he needs to have on going follow up with a PCP. He states he misses appointments due to his work schedule.  Diagnosis:   DSM5: Schizophrenia Disorders:  none Obsessive-Compulsive Disorders:  none Trauma-Stressor Disorders:  none Substance/Addictive Disorders:  Alcohol Related Disorder - Severe (303.90) Depressive Disorders:  Major Depressive Disorder - Moderate (296.22) Total Time spent with patient: 30 minutes  Axis I: Anxiety Disorder NOS and Substance Induced Mood Disorder  ADL's:  Intact  Sleep: Fair  Appetite:  Fair  Suicidal Ideation:  Plan:  denies Intent:  denies Means:  denies Homicidal Ideation:  Plan:  denies Intent:  denies Means:  denies AEB (as evidenced by):  Psychiatric Specialty Exam: Physical Exam  Review of Systems  HENT: Negative.   Eyes: Negative.   Respiratory: Negative.   Cardiovascular: Negative.   Gastrointestinal: Negative.   Genitourinary: Negative.   Musculoskeletal: Negative.   Skin: Negative.   Neurological: Positive for weakness.  Endo/Heme/Allergies: Negative.   Psychiatric/Behavioral: Positive for substance abuse. The patient is nervous/anxious and has insomnia.     Blood pressure 153/95, pulse 76, temperature 97.3 F (36.3 C), temperature source Oral, resp. rate 18, height 6' (1.829 m), weight 70.761 kg (156 lb).Body mass index is 21.15 kg/(m^2).  General Appearance: Fairly Groomed  Engineer, water::  Fair  Speech:  Clear and Coherent  Volume:  Normal  Mood:  Anxious and worried  Affect:  anxious, worried  Thought Process:  Coherent and Goal Directed  Orientation:  Full (Time, Place, and Person)  Thought Content:  symptoms, worries, concerns  Suicidal  Thoughts:  No  Homicidal Thoughts:  No  Memory:  Immediate;   Fair Recent;   Fair Remote;   Fair  Judgement:  Fair  Insight:  Present and Shallow  Psychomotor Activity:  Restlessness  Concentration:  Fair  Recall:  [employer] of Knowledge:NA  Language: Fair  Akathisia:  No  Handed:    AIMS (if indicated):     Assets:  Desire for Improvement  Sleep:  Number of Hours: 4.5   Musculoskeletal: Strength & Muscle Tone: within normal limits Gait & Station: normal Patient leans: N/A  Current Medications: Current Facility-Administered Medications  Medication Dose Route Frequency Provider Last Rate Last Dose  . acetaminophen (TYLENOL) tablet 650 mg  650 mg Oral Q6H PRN Shuvon Rankin, NP   650 mg at 09/12/13 1644  . ibuprofen (ADVIL,MOTRIN) tablet 600 mg  600 mg Oral Q6H PRN Laverle Hobby, PA-C   600 mg at 09/15/13 4888  . insulin aspart (novoLOG) injection 0-20 Units  0-20 Units Subcutaneous TID WC Laverle Hobby, PA-C   4 Units at 09/15/13 1219  . insulin aspart (novoLOG) injection 0-5 Units  0-5 Units Subcutaneous QHS Laverle Hobby, PA-C   4 Units at 09/12/13 2217  . insulin aspart (novoLOG) injection 6 Units  6 Units Subcutaneous TID WC Laverle Hobby, PA-C   6 Units at 09/15/13 1219  . insulin glargine (LANTUS) injection 8 Units  8 Units Subcutaneous QHS Laverle Hobby, PA-C   8 Units at 09/14/13 2158  . LORazepam (ATIVAN) tablet 1 mg  1 mg Oral Q6H PRN Nicholaus Bloom, MD  1 mg at 09/14/13 2346  . magnesium hydroxide (MILK OF MAGNESIA) suspension 30 mL  30 mL Oral Daily PRN Shuvon Rankin, NP      . metFORMIN (GLUCOPHAGE) tablet 1,000 mg  1,000 mg Oral BID WC Laverle Hobby, PA-C   1,000 mg at 09/15/13 0839  . multivitamin with minerals tablet 1 tablet  1 tablet Oral Daily Nicholaus Bloom, MD   1 tablet at 09/15/13 [phone]  . thiamine (VITAMIN B-1) tablet 100 mg  100 mg Oral Daily Nicholaus Bloom, MD   100 mg at 09/15/13 5852    Lab Results:  Results for orders placed during the  hospital encounter of 09/12/13 (from the past 48 hour(s))  GLUCOSE, CAPILLARY     Status: Abnormal   Collection Time    09/13/13  4:59 PM      Result Value Ref Range   Glucose-Capillary 123 (*) 70 - 99 mg/dL  GLUCOSE, CAPILLARY     Status: Abnormal   Collection Time    09/13/13  9:58 PM      Result Value Ref Range   Glucose-Capillary 130 (*) 70 - 99 mg/dL   Comment 1 Notify RN     Comment 2 Documented in Chart    GLUCOSE, CAPILLARY     Status: Abnormal   Collection Time    09/14/13  6:18 AM      Result Value Ref Range   Glucose-Capillary 140 (*) 70 - 99 mg/dL   Comment 1 Notify RN     Comment 2 Documented in Chart    GLUCOSE, CAPILLARY     Status: Abnormal   Collection Time    09/14/13 12:08 PM      Result Value Ref Range   Glucose-Capillary 160 (*) 70 - 99 mg/dL   Comment 1 Notify RN    GLUCOSE, CAPILLARY     Status: Abnormal   Collection Time    09/14/13  5:12 PM      Result Value Ref Range   Glucose-Capillary 169 (*) 70 - 99 mg/dL   Comment 1 Notify RN    GLUCOSE, CAPILLARY     Status: Abnormal   Collection Time    09/14/13  9:15 PM      Result Value Ref Range   Glucose-Capillary 176 (*) 70 - 99 mg/dL  GLUCOSE, CAPILLARY     Status: Abnormal   Collection Time    09/15/13  6:40 AM      Result Value Ref Range   Glucose-Capillary 152 (*) 70 - 99 mg/dL  GLUCOSE, CAPILLARY     Status: Abnormal   Collection Time    09/15/13 11:10 AM      Result Value Ref Range   Glucose-Capillary 164 (*) 70 - 99 mg/dL   Comment 1 Notify RN      Physical Findings: AIMS: Facial and Oral Movements Muscles of Facial Expression: None, normal Lips and Perioral Area: None, normal Jaw: None, normal Tongue: None, normal,Extremity Movements Upper (arms, wrists, hands, fingers): None, normal Lower (legs, knees, ankles, toes): None, normal, Trunk Movements Neck, shoulders, hips: None, normal, Overall Severity Severity of abnormal movements (highest score from questions above): None,  normal Incapacitation due to abnormal movements: None, normal Patient's awareness of abnormal movements (rate only patient's report): No Awareness, Dental Status Current problems with teeth and/or dentures?: No Does patient usually wear dentures?: No  CIWA:  CIWA-Ar Total: 0 COWS:     Treatment Plan Summary: Daily contact with patient to assess and  evaluate symptoms and progress in treatment Medication management  Plan:  Supportive approach/coping skills/relapse prevention            Complete detox            Note: consider D/C in AM, wants to be at work Monday morning            Will need appointment at the [facility] Decision Making Problem Points:  Established problem, stable/improving (1) and Review of psycho-social stressors (1) Data Points:  Review of medication regiment & side effects (2)  I certify that inpatient services furnished can reasonably be expected to improve the patient's condition.   Nicholaus Bloom 09/15/2013, 3:31 PM

## 2013-09-15 NOTE — Progress Notes (Signed)
The focus of this group is to help patients review their daily goal of treatment and discuss progress on daily workbooks. Pt attended the evening group session and responded to all discussion prompts from the [place]. Pt shared that today was a good day on the unit, the highlight of which was being finished with his detox. "I don't feel horrible anymore and I'm glad for it." Pt spoke of his plan going forward to not relapse again and mentioned having support from his employer and co-workers, whom Pt also cited as having issues with alcohol. Pt reported having no additional requests from Nursing Staff this evening beyond shaving, which he was allowed to do following group. Pt's affect was appropriate.

## 2013-09-15 NOTE — Progress Notes (Signed)
D) Pt feels that a lot of his problem was because he wasn't taking his Glucophage and "my sugars were probably running high". Very concerned over the fact that he will not be taking insulin when he gets home and wants to be able to remain compliant with taking his medications. States he is feeling much better overall and is ready to be discharged tomorrow. States he needs to get back to his job which starts on Monday. Has attended the program and partisipates. A) given support, reassurance and praise. Encouragement given to Pt.  R) Pt denies SI and HI. Rates his depression and hopelessness both at a 0.

## 2013-09-16 LAB — GLUCOSE, CAPILLARY
Glucose-Capillary: 108 mg/dL — ABNORMAL HIGH (ref 70–99)
Glucose-Capillary: 115 mg/dL — ABNORMAL HIGH (ref 70–99)

## 2013-09-16 LAB — HEPATITIS PANEL, ACUTE
HCV AB: REACTIVE — AB
HEP A IGM: NONREACTIVE
HEP B C IGM: NONREACTIVE
HEP B S AG: NEGATIVE

## 2013-09-16 MED ORDER — LORAZEPAM 1 MG PO TABS: 1.0000 mg | ORAL_TABLET | Freq: Four times a day (QID) | ORAL | Status: DC | PRN

## 2013-09-16 MED ORDER — INSULIN GLARGINE 100 UNIT/ML ~~LOC~~ SOLN: 8.0000 [IU] | Freq: Every day | SUBCUTANEOUS | Status: DC

## 2013-09-16 MED ORDER — METFORMIN HCL 1000 MG PO TABS: 1000.0000 mg | ORAL_TABLET | Freq: Two times a day (BID) | ORAL | Status: DC

## 2013-09-16 NOTE — Progress Notes (Signed)
D) Pt being discharged to home via the bus. Mood and affect are appropriate and Pt denies SI and HI.  A) All belongings returned to Pt. Pt given support and reassurance along with praise. Encouraged to attend AA for 90 meetings in 90 days. All medications and follow up plans explained to Pt.  R) Pt denies SI and HI. Affect and mood are appropriate. States he is feeling much better overall.

## 2013-09-16 NOTE — Progress Notes (Signed)
Psychoeducational Group Note  Date: 09/16/2013 Time:0900 Group Topic/Focus:  Gratefulness:  The focus of this group is to help patients identify what two things they are most grateful for in their lives. What helps ground them and to center them on their work to their recovery.  Participation Level:  Active  Participation Quality:  Appropriate  Affect:  Appropriate  Cognitive:  Oriented  Insight:  Improving  Engagement in Group:  Engaged  Additional Comments:    Paulino Rily

## 2013-09-16 NOTE — [facility] Group Notes (Signed)
[facility] LCSW Group Therapy Note   09/16/2013 at 10:00 AM  Type of Therapy and Topic: Group Therapy: Feelings Around Returning Home & Establishing a Supportive Framework and Activity to Identify signs of Improvement or Decompensation   Participation Level:  Did not attend   Sheilah Pigeon, LCSW

## 2013-09-19 NOTE — Progress Notes (Signed)
Patient Discharge Instructions:  After Visit Summary (AVS):   Faxed to:  09/19/13 Discharge Summary Note:   Faxed to:  09/19/13 Psychiatric Admission Assessment Note:   Faxed to:  09/19/13 Suicide Risk Assessment - Discharge Assessment:   Faxed to:  09/19/13 Faxed/Sent to the Next Level Care provider:  09/19/13 Faxed to [facility] [place], 09/19/2013, 3:22 PM

## 2013-09-24 NOTE — Discharge Summary (Signed)
Physician Discharge Summary Note  Patient:  [name] is an 58 y.o., male MRN:  [id number] DOB:  02-25-1956 Patient phone:  [phone] (home)  Patient address:   [address] [place] [address],  Total Time spent with patient: 25 minutes  Date of Admission:  09/12/2013 Date of Discharge: 09/16/13  Reason for Admission:  Depression, Alcohol abuse   Discharge Diagnoses: Active Problems:   S/P alcohol detoxification   Psychiatric Specialty Exam: Physical Exam  Review of Systems  Constitutional: Negative.   HENT: Negative.   Eyes: Negative.   Respiratory: Negative.   Cardiovascular: Negative.   Gastrointestinal: Negative.   Genitourinary: Negative.   Musculoskeletal: Negative.   Skin: Negative.   Neurological: Negative.   Endo/Heme/Allergies: Negative.   Psychiatric/Behavioral: Positive for substance abuse. Negative for depression, suicidal ideas, hallucinations and memory loss. The patient is not nervous/anxious and does not have insomnia.     Blood pressure 108/76, pulse 106, temperature 97.7 F (36.5 C), temperature source Oral, resp. rate 16, height 6' (1.829 m), weight 70.761 kg (156 lb).Body mass index is 21.15 kg/(m^2).   General Appearance: Fairly Groomed  Engineer, water::  Fair  Speech:  Clear and Coherent  Volume:  Normal  Mood:  Anxious  Affect:  Appropriate  Thought Process:  Coherent and Goal Directed  Orientation:  Full (Time, Place, and Person)  Thought Content:  plans as he moves on, relapse prevention plan  Suicidal Thoughts:  No  Homicidal Thoughts:  No  Memory:  Immediate;   Fair Recent;   Fair Remote;   Fair  Judgement:  Fair  Insight:  Present  Psychomotor Activity:  Normal  Concentration:  Fair  Recall:  [employer] of Knowledge:NA  Language: Fair  Akathisia:  No  Handed:    AIMS (if indicated):     Assets:  Desire for Improvement Vocational/Educational  Sleep:  Number of Hours: 4.5    Musculoskeletal: Strength & Muscle Tone:  within normal limits Gait & Station: normal Patient leans: Right  DSM5: AXIS I:  Alcohol Dependence, Substance Induced Mood Disorder AXIS II:  No diagnosis AXIS III:  Past Medical History   Diagnosis  Date   .  Hepatitis C    .  Diabetes mellitus    .  Chronic hyponatremia      pt denies having   .  Alcoholic    .  Alcoholic cirrhosis of liver      Pt calls this a false diagnosis. Pt denies   .  Thrombocytopenia    .  Hyperglycemia     AXIS IV: other psychosocial or environmental problems and problems related to social environment  AXIS V: 61-70 mild symptoms  Level of Care:  OP  Hospital Course:  [name] is a 58 year old Caucasian male. He reports, "I went to the hospital ED yesterday. I believe I was having alcohol poisoning. I have been drinking heavily x 2 months, about 24-30 cans of beer daily. I know the motel room living is not a good idea. I have got to cope somehow, alcohol is the way to do it. I just need detox treatment. I don't want long term treatment. I have got to go back to work to pay back my fines. I'm on probation. I'm not suicidal. I'm just hurting, you know, cramping on my legs and arms"  During Hospitalization: Medications managed, psychoeducation, group and individual therapy. Pt currently denies SI, HI, and Psychosis. At discharge, pt rates anxiety and depression  as mild; denies withdrawal symptoms. Pt states that he does have a good supportive home environment and will followup with outpatient treatment. Affirms agreement with medication regimen and discharge plan. Denies other physical and psychological concerns at time of discharge.   Consults:  None  Significant Diagnostic Studies:  Admission labs reviewed and completed   Discharge Vitals:   Blood pressure 108/76, pulse 106, temperature 97.7 F (36.5 C), temperature source Oral, resp. rate 16, height 6' (1.829 m), weight 70.761 kg (156 lb). Body mass index is 21.15 kg/(m^2). Lab Results:   No results  found for this or any previous visit (from the past 72 hour(s)).  Physical Findings: AIMS: Facial and Oral Movements Muscles of Facial Expression: None, normal Lips and Perioral Area: None, normal Jaw: None, normal Tongue: None, normal,Extremity Movements Upper (arms, wrists, hands, fingers): None, normal Lower (legs, knees, ankles, toes): None, normal, Trunk Movements Neck, shoulders, hips: None, normal, Overall Severity Severity of abnormal movements (highest score from questions above): None, normal Incapacitation due to abnormal movements: None, normal Patient's awareness of abnormal movements (rate only patient's report): No Awareness, Dental Status Current problems with teeth and/or dentures?: No Does patient usually wear dentures?: No  CIWA:  CIWA-Ar Total: 0 COWS:     Psychiatric Specialty Exam: See Psychiatric Specialty Exam and Suicide Risk Assessment completed by Attending Physician prior to discharge.  Discharge destination:  Home  Is patient on multiple antipsychotic therapies at discharge:  No   Has Patient had three or more failed trials of antipsychotic monotherapy by history:  No  Recommended Plan for Multiple Antipsychotic Therapies: NA     Medication List       Indication   insulin glargine 100 UNIT/ML injection  Commonly known as:  LANTUS  Inject 0.08 mLs (8 Units total) into the skin at bedtime.   Indication:  Type 2 Diabetes     LORazepam 1 MG tablet  Commonly known as:  ATIVAN  Take 1 tablet (1 mg total) by mouth every 6 (six) hours as needed (anxiety not relieved by hydroxyzine).   Indication:  anxiety/withdrawal     metFORMIN 1000 MG tablet  Commonly known as:  GLUCOPHAGE  Take 1 tablet (1,000 mg total) by mouth 2 (two) times daily with a meal.   Indication:  Type 2 Diabetes       Follow-up Information   Follow up with [facility]. (Walk in between 8am-9am Monday through Friday for hospital followup/medication management/assessment for therapy  services. )    Contact information:   [address] [place] [address] Phone: [phone] Fax: [phone]      Follow-up recommendations:   Activity: as tolerated  Diet: healthy  Tests: routine blood work up  Other: patient to keep his after care appointment   Comments:   Take all your medications as prescribed by your mental healthcare provider.  Report any adverse effects and or reactions from your medicines to your outpatient provider promptly.  Patient is instructed and cautioned to not engage in alcohol and or illegal drug use while on prescription medicines.  In the event of worsening symptoms, patient is instructed to call the crisis hotline, 911 and or go to the nearest ED for appropriate evaluation and treatment of symptoms.  Follow-up with your primary care provider for your other medical issues, concerns and or health care needs.   Total Discharge Time:  Greater than 30 minutes.  Signed: Benjamine Mola, FNP-BC 09/16/2013, 12:34 PM  Agree with assessment and plan Geralyn Flash  Brett Fairy, M.D.

## 2013-10-30 ENCOUNTER — Encounter (HOSPITAL_COMMUNITY): Payer: Self-pay | Admitting: Emergency Medicine

## 2013-10-30 ENCOUNTER — Emergency Department (HOSPITAL_COMMUNITY)
Admission: EM | Admit: 2013-10-30 | Discharge: 2013-10-30 | Disposition: A | Discharge status: Home / Self Care | Payer: Self-pay | Attending: Emergency Medicine | Admitting: Emergency Medicine

## 2013-10-30 ENCOUNTER — Emergency Department (HOSPITAL_COMMUNITY)
Admission: EM | Admit: 2013-10-30 | Discharge: 2013-10-31 | Disposition: A | Discharge status: Other Acute Inpatient Hospital | Payer: Self-pay | Attending: Emergency Medicine | Admitting: Emergency Medicine

## 2013-10-30 DIAGNOSIS — Z79899 Other long term (current) drug therapy: Secondary | ICD-10-CM | POA: Insufficient documentation

## 2013-10-30 DIAGNOSIS — Z862 Personal history of diseases of the blood and blood-forming organs and certain disorders involving the immune mechanism: Secondary | ICD-10-CM | POA: Insufficient documentation

## 2013-10-30 DIAGNOSIS — F10939 Alcohol use, unspecified with withdrawal, unspecified: Secondary | ICD-10-CM | POA: Insufficient documentation

## 2013-10-30 DIAGNOSIS — F10239 Alcohol dependence with withdrawal, unspecified: Secondary | ICD-10-CM | POA: Insufficient documentation

## 2013-10-30 DIAGNOSIS — E119 Type 2 diabetes mellitus without complications: Secondary | ICD-10-CM | POA: Insufficient documentation

## 2013-10-30 DIAGNOSIS — F3289 Other specified depressive episodes: Secondary | ICD-10-CM | POA: Insufficient documentation

## 2013-10-30 DIAGNOSIS — Z8719 Personal history of other diseases of the digestive system: Secondary | ICD-10-CM | POA: Insufficient documentation

## 2013-10-30 DIAGNOSIS — F411 Generalized anxiety disorder: Secondary | ICD-10-CM | POA: Insufficient documentation

## 2013-10-30 DIAGNOSIS — Z8619 Personal history of other infectious and parasitic diseases: Secondary | ICD-10-CM | POA: Insufficient documentation

## 2013-10-30 DIAGNOSIS — F1010 Alcohol abuse, uncomplicated: Secondary | ICD-10-CM | POA: Insufficient documentation

## 2013-10-30 DIAGNOSIS — F10230 Alcohol dependence with withdrawal, uncomplicated: Secondary | ICD-10-CM

## 2013-10-30 DIAGNOSIS — Z7289 Other problems related to lifestyle: Secondary | ICD-10-CM

## 2013-10-30 DIAGNOSIS — F17200 Nicotine dependence, unspecified, uncomplicated: Secondary | ICD-10-CM | POA: Insufficient documentation

## 2013-10-30 DIAGNOSIS — F329 Major depressive disorder, single episode, unspecified: Secondary | ICD-10-CM | POA: Insufficient documentation

## 2013-10-30 DIAGNOSIS — F1090 Alcohol use, unspecified, uncomplicated: Secondary | ICD-10-CM

## 2013-10-30 HISTORY — DX: Major depressive disorder, single episode, unspecified: F32.9

## 2013-10-30 HISTORY — DX: Anxiety disorder, unspecified: F41.9

## 2013-10-30 HISTORY — DX: Depression, unspecified: F32.A

## 2013-10-30 LAB — CBC
HEMATOCRIT: 38 % — AB (ref 39.0–52.0)
Hemoglobin: 13.7 g/dL (ref 13.0–17.0)
MCH: 34.9 pg — ABNORMAL HIGH (ref 26.0–34.0)
MCHC: 36.1 g/dL — ABNORMAL HIGH (ref 30.0–36.0)
MCV: 96.7 fL (ref 78.0–100.0)
Platelets: 63 10*3/uL — ABNORMAL LOW (ref 150–400)
RBC: 3.93 MIL/uL — ABNORMAL LOW (ref 4.22–5.81)
RDW: 12.8 % (ref 11.5–15.5)
WBC: 5.7 10*3/uL (ref 4.0–10.5)

## 2013-10-30 LAB — ETHANOL: ALCOHOL ETHYL (B): 238 mg/dL — AB (ref 0–11)

## 2013-10-30 LAB — COMPREHENSIVE METABOLIC PANEL
ALBUMIN: 3.3 g/dL — AB (ref 3.5–5.2)
ALK PHOS: 130 U/L — AB (ref 39–117)
ALT: 75 U/L — AB (ref 0–53)
AST: 118 U/L — AB (ref 0–37)
BUN: 8 mg/dL (ref 6–23)
CHLORIDE: 87 meq/L — AB (ref 96–112)
CO2: 22 mEq/L (ref 19–32)
Calcium: 9.2 mg/dL (ref 8.4–10.5)
Creatinine, Ser: 0.49 mg/dL — ABNORMAL LOW (ref 0.50–1.35)
GFR calc Af Amer: >90 mL/min (ref >90)
GFR calc non Af Amer: >90 mL/min (ref >90)
Glucose, Bld: 217 mg/dL — ABNORMAL HIGH (ref 70–99)
POTASSIUM: 4.1 meq/L (ref 3.7–5.3)
Sodium: 129 mEq/L — ABNORMAL LOW (ref 137–147)
Total Bilirubin: 0.7 mg/dL (ref 0.3–1.2)
Total Protein: 7.9 g/dL (ref 6.0–8.3)

## 2013-10-30 MED ORDER — LORAZEPAM 1 MG PO TABS: 2.0000 mg | ORAL_TABLET | Freq: Once | ORAL | Status: DC

## 2013-10-30 MED ORDER — LORAZEPAM 1 MG PO TABS: 0.0000 mg | ORAL_TABLET | Freq: Two times a day (BID) | ORAL | Status: DC

## 2013-10-30 MED ORDER — ZOLPIDEM TARTRATE 5 MG PO TABS: 5.0000 mg | ORAL_TABLET | Freq: Every evening | ORAL | Status: DC | PRN

## 2013-10-30 MED ORDER — LORAZEPAM 1 MG PO TABS: 1.0000 mg | ORAL_TABLET | Freq: Three times a day (TID) | ORAL | Status: DC | PRN

## 2013-10-30 MED ORDER — VITAMIN B-1 100 MG PO TABS: 100.0000 mg | ORAL_TABLET | Freq: Every day | ORAL | Status: DC

## 2013-10-30 MED ORDER — THIAMINE HCL 100 MG/ML IJ SOLN: 100.0000 mg | Freq: Every day | INTRAMUSCULAR | Status: DC

## 2013-10-30 MED ORDER — NICOTINE 21 MG/24HR TD PT24: 21.0000 mg | MEDICATED_PATCH | Freq: Every day | TRANSDERMAL | Status: DC

## 2013-10-30 MED ORDER — ACETAMINOPHEN 325 MG PO TABS
650.0000 mg | ORAL_TABLET | ORAL | Status: DC | PRN
  Administered 2013-10-30: 650 mg via ORAL
  Filled 2013-10-30: qty 2

## 2013-10-30 MED ORDER — IBUPROFEN 400 MG PO TABS: 600.0000 mg | ORAL_TABLET | Freq: Three times a day (TID) | ORAL | Status: DC | PRN

## 2013-10-30 MED ORDER — ONDANSETRON HCL 4 MG PO TABS
4.0000 mg | ORAL_TABLET | Freq: Three times a day (TID) | ORAL | Status: DC | PRN
Start: 2013-10-30 — End: 2013-10-30
  Administered 2013-10-30: 4 mg via ORAL
  Filled 2013-10-30: qty 1

## 2013-10-30 MED ORDER — LORAZEPAM 1 MG PO TABS: 2.0000 mg | ORAL_TABLET | Freq: Three times a day (TID) | ORAL | Status: DC | PRN

## 2013-10-30 MED ORDER — LORAZEPAM 2 MG/ML IJ SOLN: 0.0000 mg | Freq: Two times a day (BID) | INTRAMUSCULAR | Status: DC

## 2013-10-30 MED ORDER — LORAZEPAM 2 MG/ML IJ SOLN: 0.0000 mg | Freq: Four times a day (QID) | INTRAMUSCULAR | Status: DC

## 2013-10-30 MED ORDER — LORAZEPAM 1 MG PO TABS
0.0000 mg | ORAL_TABLET | Freq: Four times a day (QID) | ORAL | Status: DC
  Administered 2013-10-30: 2 mg via ORAL
  Filled 2013-10-30: qty 2

## 2013-10-30 NOTE — ED Notes (Signed)
Pt spoke with MD re: out pt option. Declining inpt tx. Has eaten happy meal. meds given. VSS.

## 2013-10-30 NOTE — ED Provider Notes (Signed)
CSN: [id number]     Arrival date & time 10/30/13  0019 History   First MD Initiated Contact with Patient 10/30/13 0449     Chief Complaint  Patient presents with  . Alcohol Problem     (Consider location/radiation/quality/duration/timing/severity/associated sxs/prior Treatment) Patient is a 58 y.o. male presenting with alcohol problem. The history is provided by the patient.  Alcohol Problem This is a chronic problem. The current episode started more than 1 week ago. The problem occurs constantly. The problem has been gradually worsening. Pertinent negatives include no chest pain, no abdominal pain and no shortness of breath. Nothing aggravates the symptoms. Nothing relieves the symptoms.    Past Medical History  Diagnosis Date  . Hepatitis C   . Diabetes mellitus   . Chronic hyponatremia     pt denies having   . Alcoholic   . Alcoholic cirrhosis of liver     Pt calls this a false diagnosis. Pt denies  . Thrombocytopenia   . Hyperglycemia    Past Surgical History  Procedure Laterality Date  . Plate in lower right leg  2008   Family History  Problem Relation Age of Onset  . ALS Father    History  Substance Use Topics  . Smoking status: Current Every Day Smoker -- 1.00 packs/day for 40 years    Types: Cigarettes  . Smokeless tobacco: Never Used  . Alcohol Use: 9.0 oz/week    15 Cans of beer per week     Comment: 18/daily    Review of Systems  Constitutional: Negative for fever.  Respiratory: Negative for cough and shortness of breath.   Cardiovascular: Negative for chest pain.  Gastrointestinal: Negative for abdominal pain.  All other systems reviewed and are negative.     Allergies  Benadryl; Librium; and Sulfa antibiotics  Home Medications   Prior to Admission medications   Medication Sig Start Date End Date Taking? Authorizing Provider  metFORMIN (GLUCOPHAGE) 1000 MG tablet Take 1 tablet (1,000 mg total) by mouth 2 (two) times daily with a meal. 09/16/13   Yes Benjamine Mola, FNP  LORazepam (ATIVAN) 1 MG tablet Take 2 tablets (2 mg total) by mouth 3 (three) times daily as needed (alcohol withdrawal). 10/30/13   Osvaldo Shipper, MD   BP 157/86  Pulse 100  Temp(Src) 97.8 F (36.6 C) (Oral)  Resp 16  SpO2 97% Physical Exam  Nursing note and vitals reviewed. Constitutional: He is oriented to person, place, and time. He appears well-developed and well-nourished. No distress.  HENT:  Head: Normocephalic and atraumatic.  Mouth/Throat: Oropharynx is clear and moist. No oropharyngeal exudate.  Eyes: EOM are normal. Pupils are equal, round, and reactive to light.  Neck: Normal range of motion. Neck supple.  Cardiovascular: Normal rate and regular rhythm.  Exam reveals no friction rub.   No murmur heard. Pulmonary/Chest: Effort normal and breath sounds normal. No respiratory distress. He has no wheezes. He has no rales.  Abdominal: Soft. He exhibits no distension. There is no tenderness. There is no rebound.  Musculoskeletal: Normal range of motion. He exhibits no edema.  Neurological: He is alert and oriented to person, place, and time. No cranial nerve deficit. He exhibits normal muscle tone. Coordination normal.  Skin: No rash noted. He is not diaphoretic.    ED Course  Procedures (including critical care time) Labs Review Labs Reviewed  CBC - Abnormal; Notable for the following:    RBC 3.93 (*)    HCT 38.0 (*)  MCH 34.9 (*)    MCHC 36.1 (*)    Platelets 63 (*)    All other components within normal limits  COMPREHENSIVE METABOLIC PANEL - Abnormal; Notable for the following:    Sodium 129 (*)    Chloride 87 (*)    Glucose, Bld 217 (*)    Creatinine, Ser 0.49 (*)    Albumin 3.3 (*)    AST 118 (*)    ALT 75 (*)    Alkaline Phosphatase 130 (*)    All other components within normal limits  ETHANOL - Abnormal; Notable for the following:    Alcohol, Ethyl (B) 238 (*)    All other components within normal limits  URINE RAPID  DRUG SCREEN (HOSP PERFORMED)    Imaging Review No results found.   EKG Interpretation None      MDM   Final diagnoses:  Alcohol problem drinking    89 rolled male with said he got from alcohol. Has detoxed before, most recently 6 weeks ago. Last drink roughly 6-1/2 hours ago. He denies any homicidal or suicidal ideation. I discussed with him inpatient versus outpatient, he likes route patient. Given Ativan. Patient eating and acting currently sober here. Lashes of dehydration from both eating, no need for IV fluids. Stable for discharge    Osvaldo Shipper, MD 10/30/13 [phone]

## 2013-10-30 NOTE — ED Notes (Signed)
Dr. Mingo Amber at [facility].

## 2013-10-30 NOTE — ED Notes (Signed)
Given happy meal and diet coke. C/o stomach upset, nausea, foot & leg cramping,  "withdrawals", concerned about sz, h/o sz r/t ETOH withdrawal, (denies: vomiting, dizziness, fever, SI/HI, AVH or other substance abuse). Smoker, but denies need for nicotine patch.

## 2013-10-30 NOTE — Discharge Instructions (Signed)
Alcohol Problems Most adults who drink alcohol drink in moderation (not a lot) are at low risk for developing problems related to their drinking. However, all drinkers, including low-risk drinkers, should know about the health risks connected with drinking alcohol. RECOMMENDATIONS FOR LOW-RISK DRINKING  Drink in moderation. Moderate drinking is defined as follows:   Men - no more than 2 drinks per day.  Nonpregnant women - no more than 1 drink per day.  Over age 37 - no more than 1 drink per day. A standard drink is 12 grams of pure alcohol, which is equal to a 12 ounce bottle of beer or wine cooler, a 5 ounce glass of wine, or 1.5 ounces of distilled spirits (such as whiskey, brandy, vodka, or rum).  ABSTAIN FROM (DO NOT DRINK) ALCOHOL:  When pregnant or considering pregnancy.  When taking a medication that interacts with alcohol.  If you are alcohol dependent.  A medical condition that prohibits drinking alcohol (such as ulcer, liver disease, or heart disease). DISCUSS WITH YOUR CAREGIVER:  If you are at risk for coronary heart disease, discuss the potential benefits and risks of alcohol use: Light to moderate drinking is associated with lower rates of coronary heart disease in certain populations (for example, men over age 61 and postmenopausal women). Infrequent or nondrinkers are advised not to begin light to moderate drinking to reduce the risk of coronary heart disease so as to avoid creating an alcohol-related problem. Similar protective effects can likely be gained through proper diet and exercise.  Women and the elderly have smaller amounts of body water than men. As a result women and the elderly achieve a higher blood alcohol concentration after drinking the same amount of alcohol.  Exposing a fetus to alcohol can cause a broad range of birth defects referred to as Fetal Alcohol Syndrome (FAS) or Alcohol-Related Birth Defects (ARBD). Although FAS/ARBD is connected with excessive  alcohol consumption during pregnancy, studies also have reported neurobehavioral problems in infants born to mothers reporting drinking an average of 1 drink per day during pregnancy.  Heavier drinking (the consumption of more than 4 drinks per occasion by men and more than 3 drinks per occasion by women) impairs learning (cognitive) and psychomotor functions and increases the risk of alcohol-related problems, including accidents and injuries. CAGE QUESTIONS:   Have you ever felt that you should Cut down on your drinking?  Have people Annoyed you by criticizing your drinking?  Have you ever felt bad or Guilty about your drinking?  Have you ever had a drink first thing in the morning to steady your nerves or get rid of a hangover (Eye opener)? If you answered positively to any of these questions: You may be at risk for alcohol-related problems if alcohol consumption is:   Men: Greater than 14 drinks per week or more than 4 drinks per occasion.  Women: Greater than 7 drinks per week or more than 3 drinks per occasion. Do you or your family have a medical history of alcohol-related problems, such as:  Blackouts.  Sexual dysfunction.  Depression.  Trauma.  Liver dysfunction.  Sleep disorders.  Hypertension.  Chronic abdominal pain.  Has your drinking ever caused you problems, such as problems with your family, problems with your work (or school) performance, or accidents/injuries?  Do you have a compulsion to drink or a preoccupation with drinking?  Do you have poor control or are you unable to stop drinking once you have started?  Do you have to drink to  avoid withdrawal symptoms?  Do you have problems with withdrawal such as tremors, nausea, sweats, or mood disturbances?  Does it take more alcohol than in the past to get you high?  Do you feel a strong urge to drink?  Do you change your plans so that you can have a drink?  Do you ever drink in the morning to relieve  the shakes or a hangover? If you have answered a number of the previous questions positively, it may be time for you to talk to your caregivers, family, and friends and see if they think you have a problem. Alcoholism is a chemical dependency that keeps getting worse and will eventually destroy your health and relationships. Many alcoholics end up dead, impoverished, or in prison. This is often the end result of all chemical dependency.  Do not be discouraged if you are not ready to take action immediately.  Decisions to change behavior often involve up and down desires to change and feeling like you cannot decide.  Try to think more seriously about your drinking behavior.  Think of the reasons to quit. WHERE TO GO FOR ADDITIONAL INFORMATION   The [place] on Alcohol Abuse and Alcoholism (NIAAA) http://www.bradshaw.com/  [employer] on Alcoholism and Drug Dependence (NCADD) www.ncadd.org  American Society of Addiction Medicine (ASAM) http://carpenter.net/  Document Released: 04/26/2005 Document Revised: 07/19/2011 Document Reviewed: 12/13/2007 [facility] Patient Information 2015 [place]. This information is not intended to replace advice given to you by your health care provider. Make sure you discuss any questions you have with your health care provider.   Emergency Department Resource Guide 1) Find a Doctor and Pay Out of Pocket Although you won't have to find out who is covered by your insurance plan, it is a good idea to ask around and get recommendations. You will then need to call the office and see if the doctor you have chosen will accept you as a new patient and what types of options they offer for patients who are self-pay. Some doctors offer discounts or will set up payment plans for their patients who do not have insurance, but you will need to ask so you aren't surprised when you get to your appointment.  2) Contact Your Local Health Department Not all health  departments have doctors that can see patients for sick visits, but many do, so it is worth a call to see if yours does. If you don't know where your local health department is, you can check in your phone book. The CDC also has a tool to help you locate your state's health department, and many state websites also have listings of all of their local health departments.  3) Find a [facility] If your illness is not likely to be very severe or complicated, you may want to try a walk in clinic. These are popping up all over the country in pharmacies, drugstores, and shopping centers. They're usually staffed by nurse practitioners or physician assistants that have been trained to treat common illnesses and complaints. They're usually fairly quick and inexpensive. However, if you have serious medical issues or chronic medical problems, these are probably not your best option.  No Primary Care Doctor: - Call Health Connect at  [phone] - they can help you locate a primary care doctor that  accepts your insurance, provides certain services, etc. - Physician Referral Service- [phone]  Chronic Pain Problems: Organization         Address  Phone   Notes  [facility]  [phone] Patients need to be referred by their primary care doctor.   Medication Assistance: Organization         Address  Phone   Notes  [facility] Medication [facility] [address] [place], [place] [address] [phone] --Must be a resident of [facility] -- Must have NO insurance coverage whatsoever (no Medicaid/ Medicare, etc.) -- The pt. MUST have a primary care doctor that directs their care regularly and follows them in the community   Med[facility]ist  [phone]   [employer]  [phone]    Agencies that provide inexpensive medical care: Organization         Address  Phone   Notes  [place]  [phone]   [facility]     [phone]   [facility] [place], [place] [address] [phone]   [place] [address] [place] [phone]   Planned Parenthood    [phone]   [facility]    [phone]   [place] and [place] [address], [place] Phone:  [phone], Fax:  [phone] Hours of Operation:  9 am - 6 pm, M-F.  Also accepts Medicaid/Medicare and self-pay.  [facility] [place], [address], [place] Phone: [phone], Fax: [phone]. Hours of Operation:  8:30 am - 5:30 pm, M-F.  Also accepts Medicaid and self-pay.  [facility] [address], [place] Phone: [phone]   [place], [place], [place] [phone], Ext. [phone] Mondays & Thursdays: 7-9 AM.  First 15 patients are seen on a first come, first serve basis.    [place] Providers:  Organization         Address  Phone   Notes  [facility] [address], [place] [phone] Also accepts self-pay patients.  [facility] [address], [place]  [phone]   [place], [address], [place] [phone]   [facility] [address], [place] [phone]   Lucianne Lei [address], [place]   [phone] Only accepts [place] Access [place] patients after they have their name applied to their card.   Self-Pay (no insurance) in [name]:  Organization         Address  Phone   Notes  Sickle Cell Patients, [facility] [place] [phone]   [facility] [place] [phone]   [facility] [place]  [place], [place], [place] [phone]     [facility]/Dr. Osei-Bonsu  [address], [place] or [address], [place] [phone] Phone number for both [place] and [place] locations is the same.  Urgent Medical and [facility] [address] [place] [phone]   [facility] [address] [place] or [address] [phone]   [facility] [name], [place] [phone], phone; [phone], fax Sees patients 1st and 3rd Saturday of every month.  Must not qualify for public or private insurance (i.e.  Medicaid, Medicare, [place] Health Choice, Veterans' Benefits)  Household income should be no more than 200% of the poverty level The clinic cannot treat you if you are pregnant or think you are pregnant  Sexually transmitted diseases are not treated at the clinic.    Dental Care: Organization         Address  Phone  Notes  [facility] of [facility] [place] [phone] Accepts children up to age 81 who are enrolled in [place] or [place]; pregnant women with a Medicaid card; and children who have applied for Medicaid or [place] Health Choice, but were declined, whose parents can pay a reduced fee at time of service.  [facility] Department of [facility]  [address], [place] [phone] Accepts children up to age 77 who are enrolled in [place] or [place]; pregnant women with a Medicaid card; and children who have applied for Medicaid or [place] Health Choice, but were declined, whose parents can pay a reduced fee at time of service.  [place] Adult Dental Access PROGRAM  [place] [phone] Patients are seen by appointment only. Walk-ins are not accepted. [place] will see patients 73 years of age and older. Monday - Tuesday (8am-5pm) Most Wednesdays (8:30-5pm) $30 per visit,  cash only  [facility] Adult Dental Access PROGRAM  [address], [facility] [phone] Patients are seen by appointment only. Walk-ins are not accepted. [place] will see patients 51 years of age and older. One Wednesday Evening (Monthly: Volunteer Based).  $30 per visit, cash only  [place]  [phone] for adults; Children under age 59, call Graduate Pediatric Dentistry at [phone]. Children aged 74-14, please call [phone] to request a pediatric application.  Dental services are provided in all areas of dental care including fillings, crowns and bridges, complete and partial dentures, implants, gum treatment, root canals, and extractions. Preventive care is also provided. Treatment is provided to both adults and children. Patients are selected via a lottery and there is often a waiting list.   [facility] [address] [place]  [phone] www.drcivils.com   [facility] [address] [place], [place] [phone], Ext. [phone] Second and Fourth Thursday of each month, opens at 6:30 AM; Clinic ends at 9 AM.  Patients are seen on a first-come first-served basis, and a limited number are seen during each clinic.   [facility]  [address] Hillard Danker [place], [place] [phone]   Eligibility Requirements You must have lived in [place], [place], or [place] counties for at least the last three months.   You cannot be eligible for state or federal sponsored [employer], including [employer], [place], or [employer].   You generally cannot be eligible for healthcare insurance through your employer.    How to apply: Eligibility screenings are held every Tuesday and Wednesday afternoon from 1:00 pm until 4:00 pm. You do not need an appointment for the interview!  [facility] [address] [place], [place]   [place]   [place]  [place]  [phone]    Behavioral Health Resources in the Community: Intensive Outpatient Programs Organization         Address  Phone  Notes  [facility]  Health Services [place] [address] [place], [place] [phone]   [facility] Outpatient [address], [place], [place]   ADS: Alcohol & Drug Svcs [address] [place], [place]   [place] [address]  [place], [place] or [phone]   Substance Abuse Resources Organization         Address  Phone  Notes  Alcohol and Drug Services  [phone]   [place]  [phone]   [facility] [place]   Chinita Pester  [phone]   Residential & Outpatient Substance Abuse Program  [phone]   Psychological Services Organization         Address  Phone  Notes  [facility]  [place]  [phone]   [place] [address], [place] or [phone]    Mobile Crisis Teams Organization         Address  Phone  Notes  [facility], Mobile Crisis Care Unit  [phone]   [facility]  [address] [place], [place]   [place] [address] [place] [phone]    Self-Help/Support Groups Organization         Address  Phone             Notes  [place]. of [place] - variety of support groups  [place] Call for more information  Narcotics Anonymous (NA), [facility] [address], [employer] [place]  2 meetings at this location   Special educational needs teacher         Address  Phone  Notes  ASAP Residential Treatment [place],    [place]  [phone]   [facility]  [address] [place] [id number], [place], [place]    [place], [place] [phone] Admissions: 8am-3pm M-F  Incentives Substance [place] [address],    [place], [place] [phone]   [facility] [address] [place], [place], [place]   [facility] [address],  [place], [place]   [facility] - Intensive Outpatient [address] Krist[address], [place], [place]   [facility] ([place] [place].,  [place], [place] [phone] or [phone]   [facility] Services (RTS) [address], [place], [place] Accepts Medicaid  [facility] [place] [address],  [place] [phone] Substance Abuse/Addiction Treatment   [facility] Organization         Address  Phone  Notes  [facility]  4105225112   Domenic Schwab, PhD [address] Arlis Porta [place], [place]   [phone] or [phone]   Michie [place], [place] [phone]   [facility] [address], [place], [place] [phone] Insurance/Medicaid/sponsorship through [facility] and Families [address]                                    [place], [place] [phone] [place] [address] [place], [place] [phone]    Dr. Adele Schilder  [phone]   [facility] Dept. 1) [address], [place] 2) [place] 3)  [address], [place] [phone]  [phone]   [place] [phone] or [phone] (After Hours)

## 2013-10-30 NOTE — ED Notes (Signed)
Per pt report: pt asking for ETOH detox. Pt reports last drink was over 24 hours ago. Pt reports drinking 12-18 pack of beer.  Pt a/o x 4. Pt [facility] at to [facility] at 23:00 yesterday. Pt a/o x 4. Skin warm and dry. Pt ambulatory. Pt reports nausea and anxiety but no headache or tactile/auditory disturbances.

## 2013-10-30 NOTE — ED Notes (Signed)
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 Pt denies any other substance use, denies SI/HI. States his last drink was around 930pm tonight.

## 2013-10-31 ENCOUNTER — Inpatient Hospital Stay (HOSPITAL_COMMUNITY)
Admission: EM | Admit: 2013-10-31 | Discharge: 2013-11-05 | DRG: 897 | Disposition: A | Discharge status: Home / Self Care | Payer: Federal, State, Local not specified - Other | Source: Intra-hospital | Attending: Psychiatry | Admitting: Psychiatry

## 2013-10-31 ENCOUNTER — Encounter (HOSPITAL_COMMUNITY): Payer: Self-pay | Admitting: *Deleted

## 2013-10-31 DIAGNOSIS — F17200 Nicotine dependence, unspecified, uncomplicated: Secondary | ICD-10-CM | POA: Diagnosis present

## 2013-10-31 DIAGNOSIS — E119 Type 2 diabetes mellitus without complications: Secondary | ICD-10-CM | POA: Diagnosis present

## 2013-10-31 DIAGNOSIS — F10220 Alcohol dependence with intoxication, uncomplicated: Secondary | ICD-10-CM

## 2013-10-31 DIAGNOSIS — F321 Major depressive disorder, single episode, moderate: Secondary | ICD-10-CM | POA: Diagnosis present

## 2013-10-31 DIAGNOSIS — B1920 Unspecified viral hepatitis C without hepatic coma: Secondary | ICD-10-CM | POA: Diagnosis present

## 2013-10-31 DIAGNOSIS — F411 Generalized anxiety disorder: Secondary | ICD-10-CM | POA: Diagnosis present

## 2013-10-31 DIAGNOSIS — F1020 Alcohol dependence, uncomplicated: Principal | ICD-10-CM | POA: Diagnosis present

## 2013-10-31 DIAGNOSIS — F1994 Other psychoactive substance use, unspecified with psychoactive substance-induced mood disorder: Secondary | ICD-10-CM

## 2013-10-31 DIAGNOSIS — F10230 Alcohol dependence with withdrawal, uncomplicated: Secondary | ICD-10-CM

## 2013-10-31 DIAGNOSIS — G4700 Insomnia, unspecified: Secondary | ICD-10-CM | POA: Diagnosis present

## 2013-10-31 LAB — CBC WITH DIFFERENTIAL/PLATELET
BASOS ABS: 0 10*3/uL (ref 0.0–0.1)
BASOS PCT: 0 % (ref 0–1)
Eosinophils Absolute: 0.1 10*3/uL (ref 0.0–0.7)
Eosinophils Relative: 1 % (ref 0–5)
HEMATOCRIT: 39 % (ref 39.0–52.0)
HEMOGLOBIN: 13.9 g/dL (ref 13.0–17.0)
LYMPHS PCT: 20 % (ref 12–46)
Lymphs Abs: 1.3 10*3/uL (ref 0.7–4.0)
MCH: 35.1 pg — ABNORMAL HIGH (ref 26.0–34.0)
MCHC: 35.6 g/dL (ref 30.0–36.0)
MCV: 98.5 fL (ref 78.0–100.0)
Monocytes Absolute: 0.8 10*3/uL (ref 0.1–1.0)
Monocytes Relative: 12 % (ref 3–12)
NEUTROS PCT: 67 % (ref 43–77)
Neutro Abs: 4.3 10*3/uL (ref 1.7–7.7)
Platelets: 61 10*3/uL — ABNORMAL LOW (ref 150–400)
RBC: 3.96 MIL/uL — ABNORMAL LOW (ref 4.22–5.81)
RDW: 12.9 % (ref 11.5–15.5)
WBC: 6.4 10*3/uL (ref 4.0–10.5)

## 2013-10-31 LAB — RAPID URINE DRUG SCREEN, HOSP PERFORMED
Amphetamines: NOT DETECTED
Barbiturates: NOT DETECTED
Benzodiazepines: NOT DETECTED
COCAINE: NOT DETECTED
OPIATES: NOT DETECTED
Tetrahydrocannabinol: NOT DETECTED

## 2013-10-31 LAB — GLUCOSE, CAPILLARY
GLUCOSE-CAPILLARY: 201 mg/dL — AB (ref 70–99)
GLUCOSE-CAPILLARY: 187 mg/dL — AB (ref 70–99)
GLUCOSE-CAPILLARY: 173 mg/dL — AB (ref 70–99)

## 2013-10-31 LAB — COMPREHENSIVE METABOLIC PANEL
ALT: 78 U/L — ABNORMAL HIGH (ref 0–53)
AST: 107 U/L — ABNORMAL HIGH (ref 0–37)
Albumin: 3.6 g/dL (ref 3.5–5.2)
Alkaline Phosphatase: 142 U/L — ABNORMAL HIGH (ref 39–117)
BILIRUBIN TOTAL: 1.3 mg/dL — AB (ref 0.3–1.2)
BUN: 18 mg/dL (ref 6–23)
CALCIUM: 9.2 mg/dL (ref 8.4–10.5)
CHLORIDE: 90 meq/L — AB (ref 96–112)
CO2: 27 meq/L (ref 19–32)
Creatinine, Ser: 0.68 mg/dL (ref 0.50–1.35)
GLUCOSE: 207 mg/dL — AB (ref 70–99)
Potassium: 4.4 mEq/L (ref 3.7–5.3)
Sodium: 131 mEq/L — ABNORMAL LOW (ref 137–147)
Total Protein: 8.3 g/dL (ref 6.0–8.3)

## 2013-10-31 LAB — ETHANOL: Alcohol, Ethyl (B): <11 mg/dL (ref 0–11)

## 2013-10-31 LAB — CBG MONITORING, ED: GLUCOSE-CAPILLARY: 194 mg/dL — AB (ref 70–99)

## 2013-10-31 MED ORDER — LORAZEPAM 1 MG PO TABS
1.0000 mg | ORAL_TABLET | Freq: Four times a day (QID) | ORAL | Status: AC
  Administered 2013-10-31 (×3): 1 mg via ORAL
  Filled 2013-10-31 (×3): qty 1

## 2013-10-31 MED ORDER — INSULIN ASPART 100 UNIT/ML ~~LOC~~ SOLN
4.0000 [IU] | Freq: Three times a day (TID) | SUBCUTANEOUS | Status: DC
  Administered 2013-11-01 – 2013-11-05 (×13): 4 [IU] via SUBCUTANEOUS

## 2013-10-31 MED ORDER — LORAZEPAM 1 MG PO TABS: 0.0000 mg | ORAL_TABLET | Freq: Two times a day (BID) | ORAL | Status: DC

## 2013-10-31 MED ORDER — LORAZEPAM 1 MG PO TABS
1.0000 mg | ORAL_TABLET | Freq: Two times a day (BID) | ORAL | Status: DC
  Administered 2013-11-02: 1 mg via ORAL
  Filled 2013-10-31: qty 1

## 2013-10-31 MED ORDER — ONDANSETRON HCL 4 MG PO TABS: 4.0000 mg | ORAL_TABLET | Freq: Three times a day (TID) | ORAL | Status: DC | PRN

## 2013-10-31 MED ORDER — LORAZEPAM 1 MG PO TABS
1.0000 mg | ORAL_TABLET | Freq: Three times a day (TID) | ORAL | Status: AC
  Administered 2013-11-01 (×3): 1 mg via ORAL
  Filled 2013-10-31 (×3): qty 1

## 2013-10-31 MED ORDER — LORAZEPAM 1 MG PO TABS
0.0000 mg | ORAL_TABLET | Freq: Four times a day (QID) | ORAL | Status: DC
  Administered 2013-10-31: 2 mg via ORAL
  Filled 2013-10-31: qty 2

## 2013-10-31 MED ORDER — NICOTINE 21 MG/24HR TD PT24: 21.0000 mg | MEDICATED_PATCH | Freq: Every day | TRANSDERMAL | Status: DC

## 2013-10-31 MED ORDER — ACETAMINOPHEN 325 MG PO TABS: 650.0000 mg | ORAL_TABLET | ORAL | Status: DC | PRN

## 2013-10-31 MED ORDER — MAGNESIUM HYDROXIDE 400 MG/5ML PO SUSP: 30.0000 mL | Freq: Every day | ORAL | Status: DC | PRN

## 2013-10-31 MED ORDER — VITAMIN B-1 100 MG PO TABS
50.0000 mg | ORAL_TABLET | Freq: Once | ORAL | Status: AC
  Administered 2013-10-31: 50 mg via ORAL
  Filled 2013-10-31: qty 1

## 2013-10-31 MED ORDER — ZOLPIDEM TARTRATE 5 MG PO TABS: 5.0000 mg | ORAL_TABLET | Freq: Every evening | ORAL | Status: DC | PRN

## 2013-10-31 MED ORDER — LORAZEPAM 1 MG PO TABS: 1.0000 mg | ORAL_TABLET | Freq: Every day | ORAL | Status: DC

## 2013-10-31 MED ORDER — METFORMIN HCL 500 MG PO TABS
1000.0000 mg | ORAL_TABLET | Freq: Two times a day (BID) | ORAL | Status: DC
  Administered 2013-10-31 – 2013-11-05 (×11): 1000 mg via ORAL
  Filled 2013-10-31 (×13): qty 2

## 2013-10-31 MED ORDER — TRAZODONE HCL 100 MG PO TABS
100.0000 mg | ORAL_TABLET | Freq: Every evening | ORAL | Status: DC | PRN
  Administered 2013-11-01: 100 mg via ORAL
  Filled 2013-10-31 (×2): qty 1
  Filled 2013-10-31: qty 14

## 2013-10-31 MED ORDER — IBUPROFEN 200 MG PO TABS: 600.0000 mg | ORAL_TABLET | Freq: Three times a day (TID) | ORAL | Status: DC | PRN

## 2013-10-31 MED ORDER — ALUM & MAG HYDROXIDE-SIMETH 200-200-20 MG/5ML PO SUSP
30.0000 mL | ORAL | Status: DC | PRN
  Administered 2013-11-03: 30 mL via ORAL

## 2013-10-31 MED ORDER — INSULIN ASPART 100 UNIT/ML ~~LOC~~ SOLN
0.0000 [IU] | Freq: Three times a day (TID) | SUBCUTANEOUS | Status: DC
  Administered 2013-11-01 (×2): 3 [IU] via SUBCUTANEOUS
  Administered 2013-11-01: 5 [IU] via SUBCUTANEOUS
  Administered 2013-11-02: 8 [IU] via SUBCUTANEOUS
  Administered 2013-11-02: 5 [IU] via SUBCUTANEOUS
  Administered 2013-11-02 – 2013-11-04 (×5): 3 [IU] via SUBCUTANEOUS
  Administered 2013-11-04: 5 [IU] via SUBCUTANEOUS
  Administered 2013-11-04 – 2013-11-05 (×2): 3 [IU] via SUBCUTANEOUS

## 2013-10-31 MED ORDER — ACETAMINOPHEN 325 MG PO TABS
650.0000 mg | ORAL_TABLET | Freq: Four times a day (QID) | ORAL | Status: DC | PRN
  Administered 2013-11-02: 650 mg via ORAL
  Filled 2013-10-31: qty 2

## 2013-10-31 MED ORDER — ADULT MULTIVITAMIN W/MINERALS CH
1.0000 | ORAL_TABLET | Freq: Once | ORAL | Status: AC
  Administered 2013-10-31: 1 via ORAL
  Filled 2013-10-31: qty 1

## 2013-10-31 NOTE — ED Provider Notes (Signed)
Medical screening examination/treatment/procedure(s) were performed by non-physician practitioner and as supervising physician I was immediately available for consultation/collaboration.   EKG Interpretation None       Kalman Drape, MD 10/31/13 [phone]

## 2013-10-31 NOTE — BH Assessment (Signed)
Tele Assessment Note   [name] is a 58 y.o. male who presents to [facility] for alcohol detox.  Pt denies SI/HI/AVH.  Pt reports the following: pt drinks 4-6 40's, daily.  Pt.'s last intake was on 10/29/13, pt drank 12-18 beers.  Pt was seen at [facility] but declined treatment, stating he was going to detox on his own, however pt returned stating that he needs help.  Pt denies any current w/d sxs, he was given ativan to help with w/d. However, prior to being administered ativan, he had nausea, tremors, leg and stomach cramps.  Pt denies legal issues and reports stressors that are exacerbating his alcohol use: financial, work-related and living arrangements(currently lives at [facility] and is unable to pay the weekly motel fee--$180).  Axis I: Alcohol Use D/O, Severe Axis II: Deferred Axis III:  Past Medical History  Diagnosis Date  . Hepatitis C   . Diabetes mellitus   . Chronic hyponatremia     pt denies having   . Alcoholic   . Alcoholic cirrhosis of liver     Pt calls this a false diagnosis. Pt denies  . Thrombocytopenia   . Hyperglycemia   . Depression   . Anxiety    Axis IV: economic problems, housing problems, occupational problems, other psychosocial or environmental problems, problems related to social environment and problems with primary support group Axis V: 41-50 serious symptoms  Past Medical History:  Past Medical History  Diagnosis Date  . Hepatitis C   . Diabetes mellitus   . Chronic hyponatremia     pt denies having   . Alcoholic   . Alcoholic cirrhosis of liver     Pt calls this a false diagnosis. Pt denies  . Thrombocytopenia   . Hyperglycemia   . Depression   . Anxiety     Past Surgical History  Procedure Laterality Date  . Plate in lower right leg  2008    Family History:  Family History  Problem Relation Age of Onset  . ALS Father     Social History:  reports that he has been smoking Cigarettes.  He has a 40 pack-year smoking  history. He has never used smokeless tobacco. He reports that he drinks about 9 ounces of alcohol per week. He reports that he does not use illicit drugs.  Additional Social History:  Alcohol / Drug Use Pain Medications: See MAR  Prescriptions: See MAR  Over the Counter: See MAR  History of alcohol / drug use?: Yes Longest period of sobriety (when/how long): Only when in detox  Negative Consequences of Use: Work / School;Personal relationships;Financial Withdrawal Symptoms: Other (Comment) (No current w/d sxs ) Substance #1 Name of Substance 1: Alcohol  1 - Age of First Use: 14YOM  1 - Amount (size/oz): 4-6 40's  1 - Frequency: Daily  1 - Duration: On-going  1 - Last Use / Amount: 10/29/13  CIWA: CIWA-Ar BP: 149/77 mmHg Pulse Rate: 93 Nausea and Vomiting: 2 Tactile Disturbances: mild itching, pins and needles, burning or numbness Tremor: not visible, but can be felt fingertip to fingertip Auditory Disturbances: not present Paroxysmal Sweats: two Visual Disturbances: not present Anxiety: two Headache, Fullness in Head: mild Agitation: somewhat more than normal activity Orientation and Clouding of Sensorium: oriented and can do serial additions CIWA-Ar Total: 12 COWS:    Allergies:  Allergies  Allergen Reactions  . Benadryl [Diphenhydramine Hcl]     "Causes nervousness"  . Librium [Chlordiazepoxide Hcl] Anxiety  .  Sulfa Antibiotics Rash    Home Medications:  (Not in a hospital admission)  OB/GYN Status:  No LMP for male patient.  General Assessment Data Location of Assessment: [facility] Is this a Tele or Face-to-Face Assessment?: Tele Assessment Is this an Initial Assessment or a Re-assessment for this encounter?: Initial Assessment Living Arrangements: Alone Can pt return to current living arrangement?: Yes Admission Status: Voluntary Is patient capable of signing voluntary admission?: Yes Transfer from: [facility] Referral Source: MD  Medical Screening Exam  ([place]) Medical Exam completed: No Reason for MSE not completed: Other: (None)  [place] Living Arrangements: Alone Name of Psychiatrist: None  Name of Therapist: None   Education Status Is patient currently in school?: No Current Grade: None  Highest grade of school patient has completed: None  Name of school: None  Contact person: None   Risk to self Suicidal Ideation: No Suicidal Intent: No Is patient at risk for suicide?: No Suicidal Plan?: No Access to Means: No What has been your use of drugs/alcohol within the last 12 months?: Abusing: alcohol  Previous Attempts/Gestures: No How many times?: 0 Other Self Harm Risks: 0 Triggers for Past Attempts: None known Intentional Self Injurious Behavior: None Family Suicide History: No Recent stressful life event(s): Financial Problems;Other (Comment) (work-related ) Persecutory voices/beliefs?: No Depression: Yes Depression Symptoms: Loss of interest in usual pleasures Substance abuse history and/or treatment for substance abuse?: Yes Suicide prevention information given to non-admitted patients: Not applicable  Risk to Others Homicidal Ideation: No Thoughts of Harm to Others: No Current Homicidal Intent: No Current Homicidal Plan: No Access to Homicidal Means: No Identified Victim: None  History of harm to others?: No Assessment of Violence: None Noted Violent Behavior Description: None  Does patient have access to weapons?: No Criminal Charges Pending?: No Does patient have a court date: No  Psychosis Hallucinations: None noted Delusions: None noted  Mental Status Report Appear/Hygiene: Disheveled;In scrubs Eye Contact: Fair Motor Activity: Unremarkable Speech: Logical/coherent Level of Consciousness: Alert Mood: Other (Comment) Affect: Appropriate to circumstance Anxiety Level: None Thought Processes: Coherent;Relevant Judgement: Unimpaired Orientation:  Person;Place;Time;Situation Obsessive Compulsive Thoughts/Behaviors: None  Cognitive Functioning Concentration: Normal Memory: Recent Intact;Remote Intact IQ: Average Insight: Fair Impulse Control: Fair Appetite: Fair Weight Loss: 0 Weight Gain: 0 Sleep: Decreased Total Hours of Sleep: 5 Vegetative Symptoms: None  ADLScreening [facility] Assessment Services) Patient's cognitive ability adequate to safely complete daily activities?: Yes Patient able to express need for assistance with ADLs?: Yes Independently performs ADLs?: Yes (appropriate for developmental age)  Prior Inpatient Therapy Prior Inpatient Therapy: Yes Prior Therapy Dates: [id number] Prior Therapy Facilty/Alyne Martinson(s): [place], [place], [place]  Reason for Treatment: Detox/Rehab   Prior Outpatient Therapy Prior Outpatient Therapy: No Prior Therapy Dates: None  Prior Therapy Facilty/Sufyaan Palma(s): None  Reason for Treatment: None   ADL Screening (condition at time of admission) Patient's cognitive ability adequate to safely complete daily activities?: Yes Is the patient deaf or have difficulty hearing?: No Does the patient have difficulty seeing, even when wearing glasses/contacts?: No Does the patient have difficulty concentrating, remembering, or making decisions?: No Patient able to express need for assistance with ADLs?: Yes Does the patient have difficulty dressing or bathing?: No Independently performs ADLs?: Yes (appropriate for developmental age) Does the patient have difficulty walking or climbing stairs?: No Weakness of Legs: None Weakness of Arms/Hands: None  Home Assistive Devices/Equipment Home Assistive Devices/Equipment: None  Therapy Consults (therapy consults require a physician order) PT Evaluation Needed: No OT  Evalulation Needed: No SLP Evaluation Needed: No Abuse/Neglect Assessment (Assessment to be complete while patient is alone) Physical Abuse: Denies Verbal Abuse:  Denies Sexual Abuse: Denies Exploitation of patient/patient's resources: Denies Self-Neglect: Denies Values / Beliefs Cultural Requests During Hospitalization: None Spiritual Requests During Hospitalization: None Consults Spiritual Care Consult Needed: No Social Work Consult Needed: No Regulatory affairs officer (For Healthcare) Advance Directive: Patient does not have advance directive;Patient would not like information Pre-existing out of facility DNR order (yellow form or pink MOST form): No Nutrition Screen- [facility] Adult/[facility] Patient's home diet: Regular  Additional Information 1:1 In Past 12 Months?: No CIRT Risk: No Elopement Risk: No Does patient have medical clearance?: Yes     Disposition:  Disposition Initial Assessment Completed for this Encounter: Yes Disposition of Patient: Inpatient treatment program;Referred to (Accepted by Patriciaann Clan, PA; 305-2) Type of inpatient treatment program: Adult Patient referred to: Other (Comment) (Accepted by Patriciaann Clan, PA; 305-2)  Polo Riley C 10/31/2013 6:06 AM

## 2013-10-31 NOTE — Progress Notes (Signed)
Did not attend group

## 2013-10-31 NOTE — BHH Counselor (Signed)
This Probation officer spoke with Dr. Sharol Given in regard to patient care.  This Probation officer faxed outpatient referrals to and he will be d/c'd at dr.'s discretion.

## 2013-10-31 NOTE — BHH Counselor (Signed)
This Probation officer spoke with Joaquin Courts, PA.  Pt will need to be assessed for treatment.

## 2013-10-31 NOTE — BHH Suicide Risk Assessment (Signed)
[place] INPATIENT: Family/Significant Other Suicide Prevention Education  Suicide Prevention Education:  Education Completed; No one has been identified by the patient as the family member/significant other with whom the patient will be residing, and identified as the person(s) who will aid the patient in the event of a mental health crisis (suicidal ideations/suicide attempt).   Pt did not c/o SI at admission, nor have they endorsed SI during their stay here. SPE not required. SPI pamphlet provided to pt and he was encouraged to share information with support network, ask questions, and talk about any concerns relating to SPE.  [place], [place] 10/31/2013 4:09 PM

## 2013-10-31 NOTE — H&P (Signed)
Psychiatric Admission Assessment Adult  Patient Identification:  [name] Date of Evaluation:  10/31/2013 Chief Complaint:  Alcohol Dependence History of Present Illness:: 58 Y/O male who endorses that he has continued to have a hard time. He was last discharged in May 10. He states things did not work out. He was left out there with no work, no place to stay. He relapsed immediately. He states he needs to get himself together as he has obligations, bills to pay. States he does not want to go to jail. He has been drinking every day, 4 to 6 40's. He states he had come to the ED earlier and told them to let him go as  he was wanting to detox himself but he came back as he was not able to accomplish it. States that after his relapse around Christmas time he has not been able to accomplish longer term sobriety. States that things are getting worst.  Associated Signs/Synptoms: Depression Symptoms:  depressed mood, fatigue, feelings of worthlessness/guilt, hopelessness, anxiety, loss of energy/fatigue, disturbed sleep, decreased appetite, (Hypo) Manic Symptoms:  none Anxiety Symptoms:  Excessive Worry, Psychotic Symptoms:  none PTSD Symptoms: Negative Total Time spent with patient: 45 minutes  Psychiatric Specialty Exam: Physical Exam  Review of Systems  Constitutional: Positive for malaise/fatigue.  HENT: Negative.   Eyes: Negative.   Respiratory: Negative.   Cardiovascular: Negative.   Gastrointestinal: Positive for nausea.  Genitourinary: Negative.   Musculoskeletal: Negative.   Skin: Negative.   Neurological: Positive for dizziness, tremors and weakness.  Endo/Heme/Allergies: Negative.   Psychiatric/Behavioral: Positive for substance abuse. The patient is nervous/anxious and has insomnia.     Blood pressure 155/83, pulse 83, temperature 98.4 F (36.9 C), temperature source Oral, resp. rate 20, height 6' (1.829 m), weight 70.761 kg (156 lb), SpO2 98.00%.Body mass index  is 21.15 kg/(m^2).  General Appearance: Disheveled  Eye Contact::  Minimal  Speech:  Clear and Coherent and not spontaneous  Volume:  Decreased  Mood:  Anxious, Depressed, Dysphoric, Hopeless, Worthless and frustrated  Affect:  anxious, worried  Thought Process:  Coherent and Goal Directed  Orientation:  Full (Time, Place, and Person)  Thought Content:  symptoms, worries, concerns  Suicidal Thoughts:  No  Homicidal Thoughts:  No  Memory:  Immediate;   Fair Recent;   Fair Remote;   Fair  Judgement:  Fair  Insight:  Present and Shallow  Psychomotor Activity:  Restlessness  Concentration:  Fair  Recall:  [employer] of Knowledge:NA  Language: Fair  Akathisia:  No  Handed:    AIMS (if indicated):     Assets:  Desire for Improvement  Sleep:       Musculoskeletal: Strength & Muscle Tone: within normal limits Gait & Station: normal Patient leans: N/A  Past Psychiatric History: Diagnosis:   Hospitalizations: [facility]  Outpatient Care: Not currently   Substance Abuse Care: [facility], [place], [place], [facility], other out of state facilities  Self-Mutilation: Denies  Suicidal Attempts:Denies  Violent Behaviors:Denies   Past Medical History:   Past Medical History  Diagnosis Date  . Hepatitis C   . Diabetes mellitus   . Chronic hyponatremia     pt denies having   . Alcoholic   . Alcoholic cirrhosis of liver     Pt calls this a false diagnosis. Pt denies  . Thrombocytopenia   . Hyperglycemia   . Depression   . Anxiety     Allergies:   Allergies  Allergen Reactions  . Benadryl [  Diphenhydramine Hcl]     "Causes nervousness"  . Librium [Chlordiazepoxide Hcl] Anxiety  . Sulfa Antibiotics Rash   PTA Medications: Prescriptions prior to admission  Medication Sig Dispense Refill  . metFORMIN (GLUCOPHAGE) 1000 MG tablet Take 1 tablet (1,000 mg total) by mouth 2 (two) times daily with a meal.  60 tablet  0    Previous Psychotropic Medications:  Medication/Dose    No  psychotropics             Substance Abuse History in the last 12 months:  yes  Consequences of Substance Abuse: Legal Consequences:  DWI Withdrawal Symptoms:   Diaphoresis Headaches Nausea Tremors  Social History:  reports that he has been smoking Cigarettes.  He has a 40 pack-year smoking history. He has never used smokeless tobacco. He reports that he drinks about 9 ounces of alcohol per week. He reports that he does not use illicit drugs. Additional Social History: Pain Medications: none Prescriptions: none Over the Counter: none History of alcohol / drug use?: Yes Longest period of sobriety (when/how long): couple of months Negative Consequences of Use: Financial;Personal relationships Withdrawal Symptoms: Agitation;Irritability;Sweats;Weakness;Tremors;Fever / [place] Name of Substance 1: Alcohol  1 - Age of First Use: 14YOM  1 - Amount (size/oz): 12-18 beers/day 1 - Frequency: Daily  1 - Duration: past month 1 - Last Use / Amount: 10/29/13                  Current Place of Residence:   Place of Birth:   Family Members: Marital Status:  Divorced Children:  Sons: 3 sons in their twenties  Daughters: Relationships: Education:  some college Educational Problems/Performance: Religious Beliefs/Practices: Christian non denominational History of Abuse (Emotional/Phsycial/Sexual) Occupational Experiences; Manufacturing systems engineer History:  None. Legal History: DWI Hobbies/Interests:  Family History:   Family History  Problem Relation Age of Onset  . ALS Father     Results for orders placed during the hospital encounter of 10/31/13 (from the past 72 hour(s))  GLUCOSE, CAPILLARY     Status: Abnormal   Collection Time    10/31/13 12:06 PM      Result Value Ref Range   Glucose-Capillary 201 (*) 70 - 99 mg/dL   Psychological Evaluations:  Assessment:   DSM5:  Schizophrenia Disorders:  none Obsessive-Compulsive Disorders:  none Trauma-Stressor  Disorders:  none Substance/Addictive Disorders:  Alcohol Related Disorder - Severe (303.90) Depressive Disorders:  Major Depressive Disorder - Moderate (296.22)  AXIS I:  Substance Induced Mood Disorder AXIS II:  No diagnosis AXIS III:   Past Medical History  Diagnosis Date  . Hepatitis C   . Diabetes mellitus   . Chronic hyponatremia     pt denies having   . Alcoholic   . Alcoholic cirrhosis of liver     Pt calls this a false diagnosis. Pt denies  . Thrombocytopenia   . Hyperglycemia   . Depression   . Anxiety    AXIS IV:  other psychosocial or environmental problems AXIS V:  41-50 serious symptoms  Treatment Plan/Recommendations:  Supportive approach/coping skills/relapse prevention                                                                 Ativan Detox/reassess and address the co morbities  Encourage to consider rehab Treatment Plan Summary: Daily contact with patient to assess and evaluate symptoms and progress in treatment Medication management Current Medications:  Current Facility-Administered Medications  Medication Dose Route Frequency Provider Last Rate Last Dose  . acetaminophen (TYLENOL) tablet 650 mg  650 mg Oral Q6H PRN Encarnacion Slates, NP      . alum & mag hydroxide-simeth (MAALOX/MYLANTA) 200-200-20 MG/5ML suspension 30 mL  30 mL Oral Q4H PRN Encarnacion Slates, NP      . insulin aspart (novoLOG) injection 0-15 Units  0-15 Units Subcutaneous TID WC Encarnacion Slates, NP      . insulin aspart (novoLOG) injection 4 Units  4 Units Subcutaneous TID WC Encarnacion Slates, NP      . LORazepam (ATIVAN) tablet 1 mg  1 mg Oral QID Encarnacion Slates, NP   1 mg at 10/31/13 1226   Followed by  . [START ON 11/01/2013] LORazepam (ATIVAN) tablet 1 mg  1 mg Oral TID Encarnacion Slates, NP       Followed by  . [START ON 11/02/2013] LORazepam (ATIVAN) tablet 1 mg  1 mg Oral BID Encarnacion Slates, NP       Followed by  . [START ON  11/03/2013] LORazepam (ATIVAN) tablet 1 mg  1 mg Oral Daily Encarnacion Slates, NP      . magnesium hydroxide (MILK OF MAGNESIA) suspension 30 mL  30 mL Oral Daily PRN Encarnacion Slates, NP      . metFORMIN (GLUCOPHAGE) tablet 1,000 mg  1,000 mg Oral BID WC Encarnacion Slates, NP   1,000 mg at 10/31/13 1228  . traZODone (DESYREL) tablet 100 mg  100 mg Oral QHS PRN Encarnacion Slates, NP        Observation Level/Precautions:  15 minute checks  Laboratory:  As per the ED  Psychotherapy:  Individual/group  Medications:  Ativan Detox/reassess and address the co morbities  Consultations:    Discharge Concerns:  Need for a residential treatment program  Estimated LOS: 3-5 days  Other:     I certify that inpatient services furnished can reasonably be expected to improve the patient's condition.   Ellene Bloodsaw A 6/24/20153:41 PM

## 2013-10-31 NOTE — BHH Suicide Risk Assessment (Signed)
Suicide Risk Assessment  Admission Assessment     Nursing information obtained from:    Demographic factors:    Current Mental Status:    Loss Factors:    Historical Factors:    Risk Reduction Factors:    Total Time spent with patient: 45 minutes  CLINICAL FACTORS:   Alcohol/Substance Abuse/Dependencies  PCOGNITIVE FEATURES THAT CONTRIBUTE TO RISK:  Closed-mindedness Polarized thinking Thought constriction (tunnel vision)    SUICIDE RISK:   Moderate:   PLAN OF CARE: Supportive approach/coping skills/relapse prevention                               Ativan detox/reassess and address the co morbidities  I certify that inpatient services furnished can reasonably be expected to improve the patient's condition.  [name] A 10/31/2013, 4:06 PM

## 2013-10-31 NOTE — ED Provider Notes (Signed)
CSN: [id number]     Arrival date & time 10/30/13  2212 History   First MD Initiated Contact with Patient 10/30/13 2337     Chief Complaint  Patient presents with  . ETOH detox     (Consider location/radiation/quality/duration/timing/severity/associated sxs/prior Treatment) HPI Comments: Patient is a 58 year old male who presents to the emergency department for alcohol detox. Patient states that he was going to try and detox on his own, but is feeling too anxious. Patient also feels as though he would benefit from inpatient treatment for alcohol dependence. Patient last detoxed 6 months ago. His last drink was 26.5 hours ago. Patient was seen and evaluated yesterday morning at which time he opted for outpatient management. Patient has not taken any of the Ativan prescribed to him for anxiety associated with withdrawals. Patient denies a history of seizure activity from alcohol withdrawal. He further denies SI/HI or other substance abuse.  The history is provided by the patient. No language interpreter was used.    Past Medical History  Diagnosis Date  . Hepatitis C   . Diabetes mellitus   . Chronic hyponatremia     pt denies having   . Alcoholic   . Alcoholic cirrhosis of liver     Pt calls this a false diagnosis. Pt denies  . Thrombocytopenia   . Hyperglycemia   . Depression   . Anxiety    Past Surgical History  Procedure Laterality Date  . Plate in lower right leg  2008   Family History  Problem Relation Age of Onset  . ALS Father    History  Substance Use Topics  . Smoking status: Current Every Day Smoker -- 1.00 packs/day for 40 years    Types: Cigarettes  . Smokeless tobacco: Never Used  . Alcohol Use: 9.0 oz/week    15 Cans of beer per week     Comment: 18/daily    Review of Systems  Psychiatric/Behavioral: The patient is nervous/anxious.   All other systems reviewed and are negative.    Allergies  Benadryl; Librium; and Sulfa antibiotics  Home Medications    Prior to Admission medications   Medication Sig Start Date End Date Taking? Authorizing Provider  metFORMIN (GLUCOPHAGE) 1000 MG tablet Take 1 tablet (1,000 mg total) by mouth 2 (two) times daily with a meal. 09/16/13  Yes Benjamine Mola, FNP  LORazepam (ATIVAN) 1 MG tablet Take 2 tablets (2 mg total) by mouth 3 (three) times daily as needed (alcohol withdrawal). 10/30/13   Osvaldo Shipper, MD   BP 149/77  Pulse 93  Temp(Src) 98.1 F (36.7 C) (Oral)  Resp 16  SpO2 97%  Physical Exam  Nursing note and vitals reviewed. Constitutional: He is oriented to person, place, and time. He appears well-developed and well-nourished. No distress.  Nontoxic/nonseptic appearing  HENT:  Head: Normocephalic and atraumatic.  Eyes: Conjunctivae and EOM are normal. No scleral icterus.  Neck: Normal range of motion.  Cardiovascular: Regular rhythm and normal heart sounds.  Tachycardia present.   Mild tachycardia to 101.  Pulmonary/Chest: Effort normal and breath sounds normal. No respiratory distress. He has no wheezes. He has no rales.  Musculoskeletal: Normal range of motion.  Neurological: He is alert and oriented to person, place, and time.  GCS 15. Speech is goal oriented. Patient moves extremities without ataxia.  Skin: Skin is warm and dry. No rash noted. He is not diaphoretic. No erythema. No pallor.  Psychiatric: His speech is normal and behavior is normal. His mood  appears anxious. Cognition and memory are normal. He expresses no homicidal and no suicidal ideation. He expresses no suicidal plans and no homicidal plans.    ED Course  Procedures (including critical care time) Labs Review Labs Reviewed  CBC WITH DIFFERENTIAL - Abnormal; Notable for the following:    RBC 3.96 (*)    MCH 35.1 (*)    Platelets 61 (*)    All other components within normal limits  COMPREHENSIVE METABOLIC PANEL - Abnormal; Notable for the following:    Sodium 131 (*)    Chloride 90 (*)    Glucose, Bld 207  (*)    AST 107 (*)    ALT 78 (*)    Alkaline Phosphatase 142 (*)    Total Bilirubin 1.3 (*)    All other components within normal limits  ETHANOL  URINE RAPID DRUG SCREEN (HOSP PERFORMED)    Imaging Review No results found.   EKG Interpretation None      MDM   Final diagnoses:  Alcohol dependence with uncomplicated withdrawal    58 year old male with history of alcohol dependence presents to the emergency department for placement in a rehabilitation facility for treatment. Patient seen yesterday at which time he opted for outpatient management; however, patient does not believe he is stable enough to detox on his own. Patient denies history of seizures from alcohol withdrawal. Labs reviewed and are consistent with baseline. Patient currently pending TTS evaluation to assess for inpatient treatment placement. Disposition to be determined by oncoming ED provider.   Filed Vitals:   10/30/13 2345 10/31/13 0235 10/31/13 0340 10/31/13 0457  BP: 150/81 144/89 144/89 149/77  Pulse: 101 93 93 93  Temp:  98.2 F (36.8 C)  98.1 F (36.7 C)  TempSrc:  Oral  Oral  Resp:  20  16  SpO2:  96%  97%       Antonietta Breach, PA-C 10/31/13 [phone]

## 2013-10-31 NOTE — BHH Group Notes (Signed)
[facility] LCSW Group Therapy  10/31/2013 3:51 PM  Type of Therapy:  Group Therapy  Participation Level:  Did Not Attend-pt in room sleeping   Smart, Alicia Amel  10/31/2013, 3:51 PM

## 2013-10-31 NOTE — Tx Team (Signed)
Initial Interdisciplinary Treatment Plan  PATIENT STRENGTHS: (choose at least two) Ability for insight Average or above average intelligence Capable of independent living General fund of knowledge Supportive family/friends Work skills  PATIENT STRESSORS: Financial difficulties Health problems Medication change or noncompliance Substance abuse   PROBLEM LIST: Problem List/Patient Goals Date to be addressed Date deferred Reason deferred Estimated date of resolution  "stop drinking forever"      "needs placement and shelter"                                                 DISCHARGE CRITERIA:  Ability to meet basic life and health needs Improved stabilization in mood, thinking, and/or behavior Medical problems require only outpatient monitoring Need for constant or close observation no longer present Withdrawal symptoms are absent or subacute and managed without 24-hour nursing intervention  PRELIMINARY DISCHARGE PLAN: Attend aftercare/continuing care group Attend 12-step recovery group Outpatient therapy Placement in alternative living arrangements Return to previous work or school arrangements  PATIENT/FAMIILY INVOLVEMENT: This treatment plan has been presented to and reviewed with the patient, [name].  The patient has been given the opportunity to ask questions and make suggestions.  Karel Jarvis 10/31/2013, 1:43 PM

## 2013-10-31 NOTE — Progress Notes (Signed)
Pt was admitted to 300 hall for detox from alcohol.  He was here about a month ago and he relapsed almost immediately drinking 12-18 beers/day.  He went to [facility] on Monday and his BAL was 238.  He was treated and decided to go to out-patient treatment.  He then went to his hotel room found out his employer would not have any work for him so he went to [facility] requesting detox from alcohol his uds was negative and his BAL was <11 at 2337 on the 23rd. He has hx of NIDDM, hep c, chronic hyponatremia, thrombocytopenia, has a plate in right lower leg from prior fracture, and hx of seizures from withdrawal uncertain last seizure pt stated,"awhile back"  He uses ativan d/t allergy to librium, he also is allergic to benadryl, and sulfa antibiotics.  His CBG at noon was 201 he was given his metformin and ativan.  He will start sliding scale and meal coverage at 1700.  He denies any S/H ideation or A/V/H.  His CIWA was 8 on admission.  Reoriented to unit and voiced understanding.

## 2013-11-01 LAB — GLUCOSE, CAPILLARY
GLUCOSE-CAPILLARY: 247 mg/dL — AB (ref 70–99)
Glucose-Capillary: 179 mg/dL — ABNORMAL HIGH (ref 70–99)
Glucose-Capillary: 188 mg/dL — ABNORMAL HIGH (ref 70–99)
Glucose-Capillary: 194 mg/dL — ABNORMAL HIGH (ref 70–99)

## 2013-11-01 MED ORDER — HYDROXYZINE HCL 50 MG PO TABS
50.0000 mg | ORAL_TABLET | Freq: Once | ORAL | Status: AC
  Administered 2013-11-01: 50 mg via ORAL
  Filled 2013-11-01 (×2): qty 1

## 2013-11-01 NOTE — Progress Notes (Signed)
Patient did not attend the evening karaoke group. Pt reported not feeling well enough for group and remained in bed.

## 2013-11-01 NOTE — Progress Notes (Signed)
D:Pt reports that he is anxious and is requesting Ativan to decrease anxiety and help him sleep. Offered Trazadone for sleep and explained Ativan protocol. Pt reports that he can not take Trazodone and that it does not work for him. He is persistent that he take Ativan at HS. Pt's CBG 194 and pt reports that he does not take HS insulin at home but has taken it before while in the hospital.  A:Called NP and reported CBG and pt's medication request. NP evaluating chart and considering new orders. R:Safety maintained on the unit

## 2013-11-01 NOTE — BHH Group Notes (Signed)
0900 nursing orientation group    The focus of this group is to educate the patient on the purpose and policies of crisis stabilization and provide a format to answer questions about their admission.  The group details unit policies and expectations of patients while admitted.   Pt did not attend he stayed in his bed and refused to come to group he stated,"I don't need any groups here just medication to detox".

## 2013-11-01 NOTE — Plan of Care (Signed)
Problem: Ineffective individual coping Goal: STG: Patient will remain free from self harm Outcome: Progressing Pt denies si at present time Goal: STG:Pt. will utilize relaxation techniques to reduce stress STG: Patient will utilize relaxation techniques to reduce stress levels  Outcome: Progressing Pt attending Karoke and interacting on the unit.  Problem: Alteration in mood & ability to function due to Goal: LTG-Pt reports reduction in suicidal thoughts (Patient reports reduction in suicidal thoughts and is able to verbalize a safety plan for whenever patient is feeling suicidal)  Outcome: Progressing Pt denies si at present time Goal: LTG-Patient demonstrates decreased signs of withdrawal (Patient demonstrates decreased signs of withdrawal to the point the patient is safe to return home and continue treatment in an outpatient setting)  Outcome: Progressing CIWA 4

## 2013-11-01 NOTE — BHH Group Notes (Signed)
[place] LCSW Group Therapy  11/01/2013 2:49 PM  Type of Therapy:  Group Therapy  Participation Level:  Did Not Attend-pt in room sleeping during afternoon group.   Smart, Heather LCSWA  11/01/2013, 2:49 PM

## 2013-11-01 NOTE — Progress Notes (Signed)
Patient ID: [name], male   DOB: 24-Jun-1955, 58 y.o.   MRN: [id number]   D: Pt was laying in bed during the assessment. Writer asked pt why he wasn't attending the group. Pt stated, "I didn't come to go to group, I came to detox." Writer explained to the pt that if he would attend groups there may be something he could learn that would help him decrease the amount of times he comes for detox.  Pt repeated that he "only came for detox".  Writer asked pt if it's his intentions to stop drinking. Pt took several seconds then finally stated, "Yeah".  A:  Support and encouragement was offered. 15 min checks continued for safety.  R: Pt remains safe.
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
## 2013-11-01 NOTE — Progress Notes (Signed)
Pt has not been up for any groups today he refuses to participate and stated,"I am not here for meetings and groups I am here to detox".  He denies any depression or hopelessness but admitted to his anxiety level a 7 on his self-inventory.  He denies any S/H ideation or A/V/H.  He is unsure where he will go from here.  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 His CIWA was 11 this morning and has not received any prn meds thus far.

## 2013-11-01 NOTE — Progress Notes (Signed)
D:Pt reports that his detox symptoms are decreasing with mild reports of nausea and anxiety. Pt is calm, pleasant and resting in his room following dinner. A:Offered support, encouragement and 15 minute checks.  R:Pt denies si and hi. Safety maintained on the unit.

## 2013-11-01 NOTE — Progress Notes (Signed)
[facility] MD Progress Note  11/01/2013 3:34 PM RICKE KIMOTO  MRN:  [id number] Subjective:  [name] states he is still sweating it off. States the sweat wakes him up at night. States that this is the way it goes with him. States this is the worst withdrawal he has been trough. States he hopes this is the last time Diagnosis:   DSM5: Schizophrenia Disorders:  none Obsessive-Compulsive Disorders:  none Trauma-Stressor Disorders:  none Substance/Addictive Disorders:  Alcohol Related Disorder - Severe (303.90) Depressive Disorders:  Major Depressive Disorder - Moderate (296.22) Total Time spent with patient: 30 minutes  Axis I: Substance Induced Mood Disorder  ADL's:  Intact  Sleep: Fair  Appetite:  Poor  Suicidal Ideation:  Plan:  denies Intent:  denies Means:  denies Homicidal Ideation:  Plan:  denies Intent:  denies Means:  denies AEB (as evidenced by):  Psychiatric Specialty Exam: Physical Exam  ROS  Blood pressure 133/83, pulse 89, temperature 98.4 F (36.9 C), temperature source Oral, resp. rate 20, height 6' (1.829 m), weight 70.761 kg (156 lb), SpO2 98.00%.Body mass index is 21.15 kg/(m^2).  General Appearance: Disheveled and sweating  Eye Contact::  Fair  Speech:  Clear and Coherent  Volume:  fluctuates  Mood:  Anxious and Dysphoric  Affect:  anxious, worried  Thought Process:  Coherent and Goal Directed  Orientation:  Full (Time, Place, and Person)  Thought Content:  symptoms, worries, concerns  Suicidal Thoughts:  No  Homicidal Thoughts:  No  Memory:  Immediate;   Fair Recent;   Fair Remote;   Fair  Judgement:  Fair  Insight:  Shallow  Psychomotor Activity:  Restlessness  Concentration:  Fair  Recall:  [employer] of Knowledge:NA  Language: Fair  Akathisia:  No  Handed:    AIMS (if indicated):     Assets:  Desire for Improvement  Sleep:  Number of Hours: 6.75   Musculoskeletal: Strength & Muscle Tone: within normal limits Gait & Station:  normal Patient leans: N/A  Current Medications: Current Facility-Administered Medications  Medication Dose Route Frequency Provider Last Rate Last Dose  . acetaminophen (TYLENOL) tablet 650 mg  650 mg Oral Q6H PRN Encarnacion Slates, NP      . alum & mag hydroxide-simeth (MAALOX/MYLANTA) 200-200-20 MG/5ML suspension 30 mL  30 mL Oral Q4H PRN Encarnacion Slates, NP      . insulin aspart (novoLOG) injection 0-15 Units  0-15 Units Subcutaneous TID WC Encarnacion Slates, NP   3 Units at 11/01/13 1209  . insulin aspart (novoLOG) injection 4 Units  4 Units Subcutaneous TID WC Encarnacion Slates, NP   4 Units at 11/01/13 1208  . LORazepam (ATIVAN) tablet 1 mg  1 mg Oral TID Encarnacion Slates, NP   1 mg at 11/01/13 1206   Followed by  . [START ON 11/02/2013] LORazepam (ATIVAN) tablet 1 mg  1 mg Oral BID Encarnacion Slates, NP       Followed by  . [START ON 11/03/2013] LORazepam (ATIVAN) tablet 1 mg  1 mg Oral Daily Encarnacion Slates, NP      . magnesium hydroxide (MILK OF MAGNESIA) suspension 30 mL  30 mL Oral Daily PRN Encarnacion Slates, NP      . metFORMIN (GLUCOPHAGE) tablet 1,000 mg  1,000 mg Oral BID WC Encarnacion Slates, NP   1,000 mg at 11/01/13 0811  . traZODone (DESYREL) tablet 100 mg  100 mg Oral QHS PRN Encarnacion Slates, NP  Lab Results:  Results for orders placed during the hospital encounter of 10/31/13 (from the past 48 hour(s))  GLUCOSE, CAPILLARY     Status: Abnormal   Collection Time    10/31/13 12:06 PM      Result Value Ref Range   Glucose-Capillary 201 (*) 70 - 99 mg/dL  GLUCOSE, CAPILLARY     Status: Abnormal   Collection Time    10/31/13  4:59 PM      Result Value Ref Range   Glucose-Capillary 187 (*) 70 - 99 mg/dL   Comment 1 Notify RN    GLUCOSE, CAPILLARY     Status: Abnormal   Collection Time    10/31/13  9:22 PM      Result Value Ref Range   Glucose-Capillary 173 (*) 70 - 99 mg/dL  GLUCOSE, CAPILLARY     Status: Abnormal   Collection Time    11/01/13  6:07 AM      Result Value Ref Range    Glucose-Capillary 179 (*) 70 - 99 mg/dL  GLUCOSE, CAPILLARY     Status: Abnormal   Collection Time    11/01/13 11:50 AM      Result Value Ref Range   Glucose-Capillary 188 (*) 70 - 99 mg/dL    Physical Findings: AIMS: Facial and Oral Movements Muscles of Facial Expression: None, normal Lips and Perioral Area: None, normal Jaw: None, normal Tongue: None, normal,Extremity Movements Upper (arms, wrists, hands, fingers): None, normal Lower (legs, knees, ankles, toes): None, normal, Trunk Movements Neck, shoulders, hips: None, normal, Overall Severity Severity of abnormal movements (highest score from questions above): None, normal Incapacitation due to abnormal movements: None, normal Patient's awareness of abnormal movements (rate only patient's report): No Awareness, Dental Status Current problems with teeth and/or dentures?: No Does patient usually wear dentures?: No  CIWA:  CIWA-Ar Total: 11 COWS:     Treatment Plan Summary: Daily contact with patient to assess and evaluate symptoms and progress in treatment Medication management  Plan: Supportive approach/coping skills/relapse prevention           Continue detox           Reassess and address the co morbities  Medical Decision Making Problem Points:  Review of psycho-social stressors (1) Data Points:  Review of medication regiment & side effects (2)  I certify that inpatient services furnished can reasonably be expected to improve the patient's condition.   LUGO,IRVING A 11/01/2013, 3:34 PM

## 2013-11-02 DIAGNOSIS — F411 Generalized anxiety disorder: Secondary | ICD-10-CM

## 2013-11-02 LAB — GLUCOSE, CAPILLARY
Glucose-Capillary: 221 mg/dL — ABNORMAL HIGH (ref 70–99)
Glucose-Capillary: 165 mg/dL — ABNORMAL HIGH (ref 70–99)
Glucose-Capillary: 278 mg/dL — ABNORMAL HIGH (ref 70–99)
Glucose-Capillary: 161 mg/dL — ABNORMAL HIGH (ref 70–99)

## 2013-11-02 MED ORDER — LORAZEPAM 1 MG PO TABS
1.0000 mg | ORAL_TABLET | Freq: Four times a day (QID) | ORAL | Status: DC | PRN
  Administered 2013-11-03 – 2013-11-04 (×2): 1 mg via ORAL
  Filled 2013-11-02 (×3): qty 1

## 2013-11-02 MED ORDER — GLUCERNA SHAKE PO LIQD
237.0000 mL | Freq: Three times a day (TID) | ORAL | Status: DC
  Administered 2013-11-02 – 2013-11-05 (×6): 237 mL via ORAL

## 2013-11-02 MED ORDER — GLUCERNA 1.2 CAL PO LIQD
1000.0000 mL | ORAL | Status: DC
  Administered 2013-11-02: 237 mL
  Filled 2013-11-02: qty 1000

## 2013-11-02 MED ORDER — LORAZEPAM 1 MG PO TABS
1.0000 mg | ORAL_TABLET | Freq: Three times a day (TID) | ORAL | Status: DC
  Administered 2013-11-02 – 2013-11-04 (×6): 1 mg via ORAL
  Filled 2013-11-02 (×5): qty 1

## 2013-11-02 NOTE — Tx Team (Signed)
Interdisciplinary Treatment Plan Update (Adult)  Date: 11/02/2013   Time Reviewed: 11:12 AM  Progress in Treatment:  Attending groups: No.  Participating in groups:  No.  Taking medication as prescribed: Yes  Tolerating medication: Yes  Family/Significant othe contact made: No. SPE not required for this pt.   Patient understands diagnosis: Yes, AEB seeking treatment for ETOH detox, mood stabilization, and med management.  Discussing patient identified problems/goals with staff: Yes  Medical problems stabilized or resolved: Yes  Denies suicidal/homicidal ideation: Yes during admission/self report.  Patient has not harmed self or Others: Yes  New problem(s) identified:  Discharge Plan or Barriers: Pt plans to follow up at [facility] and stay at shelter until finding stable housing. 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 He is not open to inpatient treatment and is minimally invested in treatment while at [facility] in bed, isolating, and not attending groups.  Additional comments: 59 Y/O male who endorses that he has continued to have a hard time. He was last discharged in May 10. He states things did not work out. He was left out there with no work, no place to stay. He relapsed immediately. He states he needs to get himself together as he has obligations, bills to pay. States he does not want to go to jail. He has been drinking every day, 4 to 6 40's. He states he had come to the ED earlier and told them to let him go as he was wanting to detox himself but he came back as he was not able to accomplish it. States that after his relapse around Christmas time he has not been able to accomplish longer term sobriety. States that things are getting worst.  Reason for Continuation of Hospitalization: Ativan Taper-withdrawals Med management Mood stabilization  Estimated length of stay: 2-3 days (d/c scheduled for Monday)  For review of initial/current patient goals, please see plan of care.   Attendees:  Patient:    Family:    Physician: Carlton Adam MD 11/02/2013 11:12 AM   Nursing: Oletta Darter RN  11/02/2013 11:12 AM   Clinical Social Worker [place], [place]  11/02/2013 11:12 AM   Other: Doroteo Bradford RN 11/02/2013 11:12 AM   Other:    Other: Jiles Garter, Pharmacist  11/02/2013 11:12 AM   Other:    Scribe for Treatment Team:  [place] LCSWA 11/02/2013 11:12 AM

## 2013-11-02 NOTE — Progress Notes (Signed)
[facility] MD Progress Note  11/02/2013 2:29 PM [name]  MRN:  [id number] Subjective:  Does not feel he is trough the withdrawal yet. States he is still having the sweats, the shakes and is having some visual distoritons. Would like to be able to stay on the Ativan a little longer. He is still saying he needs to be ready to get out of here by Monday as he has to somehow get to work as if he does not make money right now he will be going to jail. Diagnosis:   DSM5: Schizophrenia Disorders:  none Obsessive-Compulsive Disorders:  none Trauma-Stressor Disorders:  none Substance/Addictive Disorders:  Alcohol Related Disorder - Severe (303.90) Depressive Disorders:  Major Depressive Disorder - Moderate (296.22) Total Time spent with patient: 45 minutes  Axis I: Anxiety Disorder NOS and Substance Induced Mood Disorder  ADL's:  Intact  Sleep: Poor  Appetite:  Poor  Suicidal Ideation:  Plan:  denies Intent:  denies Means:  denies Homicidal Ideation:  Plan:  denies Intent:  denies Means:  denies AEB (as evidenced by):  Psychiatric Specialty Exam: Physical Exam  Review of Systems  Constitutional: Positive for malaise/fatigue.  HENT: Negative.   Eyes: Negative.   Cardiovascular: Negative.   Gastrointestinal: Negative.   Genitourinary: Negative.   Musculoskeletal: Positive for myalgias.  Skin: Negative.   Neurological: Positive for weakness.  Endo/Heme/Allergies: Negative.   Psychiatric/Behavioral: Positive for depression and substance abuse. The patient is nervous/anxious and has insomnia.     Blood pressure 131/86, pulse 93, temperature 98.4 F (36.9 C), temperature source Oral, resp. rate 16, height 6' (1.829 m), weight 70.761 kg (156 lb), SpO2 98.00%.Body mass index is 21.15 kg/(m^2).  General Appearance: Fairly Groomed  Engineer, water::  Fair  Speech:  Clear and Coherent  Volume:  Normal  Mood:  Anxious and worried  Affect:  anxious, worried  Thought Process:  Coherent  and Goal Directed  Orientation:  Full (Time, Place, and Person)  Thought Content:  symptoms, worries, concerns  Suicidal Thoughts:  No  Homicidal Thoughts:  No  Memory:  Immediate;   Fair Recent;   Fair Remote;   Fair  Judgement:  Fair  Insight:  Present and Shallow  Psychomotor Activity:  Restlessness  Concentration:  Fair  Recall:  [employer] of Knowledge:NA  Language: Fair  Akathisia:  No  Handed:    AIMS (if indicated):     Assets:  Desire for Improvement  Sleep:  Number of Hours: 4   Musculoskeletal: Strength & Muscle Tone: within normal limits Gait & Station: normal Patient leans: N/A  Current Medications: Current Facility-Administered Medications  Medication Dose Route Frequency Cervando Durnin Last Rate Last Dose  . acetaminophen (TYLENOL) tablet 650 mg  650 mg Oral Q6H PRN [name] Slates, [name]   650 mg at 11/02/13 5638  . alum & mag hydroxide-simeth (MAALOX/MYLANTA) 200-200-20 MG/5ML suspension 30 mL  30 mL Oral Q4H PRN [name] Slates, [name]      . insulin aspart (novoLOG) injection 0-15 Units  0-15 Units Subcutaneous TID WC [name] Slates, [name]   3 Units at 11/02/13 1218  . insulin aspart (novoLOG) injection 4 Units  4 Units Subcutaneous TID WC [name] Slates, [name]   4 Units at 11/02/13 1219  . LORazepam (ATIVAN) tablet 1 mg  1 mg Oral Q6H PRN Nicholaus Bloom, MD      . LORazepam (ATIVAN) tablet 1 mg  1 mg Oral TID Nicholaus Bloom, MD   1  mg at 11/02/13 1423  . magnesium hydroxide (MILK OF MAGNESIA) suspension 30 mL  30 mL Oral Daily PRN [name] Slates, [name]      . metFORMIN (GLUCOPHAGE) tablet 1,000 mg  1,000 mg Oral BID WC [name] Slates, [name]   1,000 mg at 11/02/13 0807  . traZODone (DESYREL) tablet 100 mg  100 mg Oral QHS PRN [name] Slates, [name]   100 mg at 11/01/13 2226    Lab Results:  Results for orders placed during the hospital encounter of 10/31/13 (from the past 48 hour(s))  GLUCOSE, CAPILLARY     Status: Abnormal   Collection Time    10/31/13  4:59 PM      Result Value Ref Range    Glucose-Capillary 187 (*) 70 - 99 mg/dL   Comment 1 Notify RN    GLUCOSE, CAPILLARY     Status: Abnormal   Collection Time    10/31/13  9:22 PM      Result Value Ref Range   Glucose-Capillary 173 (*) 70 - 99 mg/dL  GLUCOSE, CAPILLARY     Status: Abnormal   Collection Time    11/01/13  6:07 AM      Result Value Ref Range   Glucose-Capillary 179 (*) 70 - 99 mg/dL  GLUCOSE, CAPILLARY     Status: Abnormal   Collection Time    11/01/13 11:50 AM      Result Value Ref Range   Glucose-Capillary 188 (*) 70 - 99 mg/dL  GLUCOSE, CAPILLARY     Status: Abnormal   Collection Time    11/01/13  4:49 PM      Result Value Ref Range   Glucose-Capillary 247 (*) 70 - 99 mg/dL  GLUCOSE, CAPILLARY     Status: Abnormal   Collection Time    11/01/13  9:50 PM      Result Value Ref Range   Glucose-Capillary 194 (*) 70 - 99 mg/dL   Comment 1 Notify RN     Comment 2 Documented in Chart    GLUCOSE, CAPILLARY     Status: Abnormal   Collection Time    11/02/13  5:55 AM      Result Value Ref Range   Glucose-Capillary 221 (*) 70 - 99 mg/dL  GLUCOSE, CAPILLARY     Status: Abnormal   Collection Time    11/02/13 12:14 PM      Result Value Ref Range   Glucose-Capillary 165 (*) 70 - 99 mg/dL   Comment 1 Notify RN      Physical Findings: AIMS: Facial and Oral Movements Muscles of Facial Expression: None, normal Lips and Perioral Area: None, normal Jaw: None, normal Tongue: None, normal,Extremity Movements Upper (arms, wrists, hands, fingers): None, normal Lower (legs, knees, ankles, toes): None, normal, Trunk Movements Neck, shoulders, hips: None, normal, Overall Severity Severity of abnormal movements (highest score from questions above): None, normal Incapacitation due to abnormal movements: None, normal Patient's awareness of abnormal movements (rate only patient's report): No Awareness, Dental Status Current problems with teeth and/or dentures?: No Does patient usually wear dentures?: No  CIWA:   CIWA-Ar Total: 5 COWS:     Treatment Plan Summary: Daily contact with patient to assess and evaluate symptoms and progress in treatment Medication management  Plan: Supportive approach/coping skills/relapse prevention           Slow down the detox             Medical Decision Making Problem Points:  Established problem, worsening (  2) and Review of psycho-social stressors (1) Data Points:  Review of medication regiment & side effects (2)  I certify that inpatient services furnished can reasonably be expected to improve the patient's condition.   LUGO,IRVING A 11/02/2013, 2:29 PM

## 2013-11-02 NOTE — Progress Notes (Signed)
D: Patient denies SI/HI or AVH.  He is flat and depressed but appropriate.  Pt. Secludes to his room often and has not been attending groups.  Pt. Is otherwise appropriate with others on the unit.  He states that his appetite is improving and sleep was poor last night "because I need more ativan".  A: Patient given emotional support from RN. Patient encouraged to come to staff with concerns and/or questions. Patient's medication routine continued. Patient's orders and plan of care reviewed.   R: Patient remains appropriate and cooperative. Will continue to monitor patient q15 minutes for safety.

## 2013-11-02 NOTE — BHH Group Notes (Signed)
[facility] LCSW Aftercare Discharge Planning Group Note   11/02/2013 11:12 AM  Participation Quality:  DID NOT ATTEND-pt in room sleeping   Smart, Heather LCSWA

## 2013-11-02 NOTE — Progress Notes (Signed)
Assumed care of patient at 2300. Pt has rested since 2400 without difficulty. No acute distress, no complaints voiced. Level III obs in place for safety and pt remains safe. Jamie Kato

## 2013-11-02 NOTE — BHH Group Notes (Signed)
[place] LCSW Group Therapy  11/02/2013 3:06 PM  Type of Therapy:  Group Therapy  Participation Level:  Did Not Attend-pt in room resting/did not come to afternoon group.   Smart, Heather LCSWA  11/02/2013, 3:06 PM

## 2013-11-02 NOTE — Progress Notes (Signed)
D:Pt presents with a flat/anxious affect. Pt reports that he feels anxious with detox. No visible tremors observed tonight. A:Offered support, encouragement, medication and 15 minute checks. R:Pt denies si and hi. Safety maintained on the unit.

## 2013-11-03 LAB — GLUCOSE, CAPILLARY
Glucose-Capillary: 190 mg/dL — ABNORMAL HIGH (ref 70–99)
Glucose-Capillary: 194 mg/dL — ABNORMAL HIGH (ref 70–99)
Glucose-Capillary: 185 mg/dL — ABNORMAL HIGH (ref 70–99)
Glucose-Capillary: 180 mg/dL — ABNORMAL HIGH (ref 70–99)

## 2013-11-03 NOTE — Progress Notes (Signed)
[facility] MD Progress Note  11/03/2013 4:21 PM [name]  MRN:  [id number] Subjective:  Still not feeling too well. Still feels he is detoxing. States he is still not resting that well at night. 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 to take care of his financial obligations Diagnosis:   DSM5: Schizophrenia Disorders:  none Obsessive-Compulsive Disorders:  none Trauma-Stressor Disorders:  none Substance/Addictive Disorders:  Alcohol Related Disorder - Severe (303.90) Depressive Disorders:  Major Depressive Disorder - Moderate (296.22) Total Time spent with patient: 30 minutes  Axis I: Anxiety Disorder NOS and Substance Induced Mood Disorder  ADL's:  Intact  Sleep: Poor  Appetite:  Fair  Suicidal Ideation:  Plan:  denies Intent:  denies Means:  denies Homicidal Ideation:  Plan:  denies Intent:  denies Means:  denies AEB (as evidenced by):  Psychiatric Specialty Exam: Physical Exam  Review of Systems  Constitutional: Positive for malaise/fatigue.  HENT: Negative.   Eyes: Negative.   Respiratory: Negative.   Cardiovascular: Negative.   Gastrointestinal: Negative.   Genitourinary: Negative.   Musculoskeletal: Positive for myalgias.  Skin: Negative.   Neurological: Positive for weakness.  Endo/Heme/Allergies: Negative.   Psychiatric/Behavioral: Positive for depression and substance abuse. The patient is nervous/anxious and has insomnia.     Blood pressure 127/82, pulse 75, temperature 98.4 F (36.9 C), temperature source Oral, resp. rate 16, height 6' (1.829 m), weight 70.761 kg (156 lb), SpO2 98.00%.Body mass index is 21.15 kg/(m^2).  General Appearance: Disheveled  Eye Sport and exercise psychologist::  Fair  Speech:  Clear and Coherent  Volume:  Decreased  Mood:  Anxious and worried  Affect:  anxious, worried  Thought Process:  Coherent and Goal Directed  Orientation:  Full (Time, Place, and Person)  Thought  Content:  symptoms, worries, concerns  Suicidal Thoughts:  No  Homicidal Thoughts:  No  Memory:  Immediate;   Fair Recent;   Fair Remote;   Fair  Judgement:  Fair  Insight:  Present  Psychomotor Activity:  Restlessness  Concentration:  Fair  Recall:  [employer] of Knowledge:NA  Language: Fair  Akathisia:  No  Handed:    AIMS (if indicated):     Assets:  Desire for Improvement  Sleep:  Number of Hours: 5   Musculoskeletal: Strength & Muscle Tone: within normal limits Gait & Station: normal Patient leans: N/[name]  Current Medications: Current Facility-Administered Medications  Medication Dose Route Frequency Provider Last Rate Last Dose  . acetaminophen (TYLENOL) tablet 650 mg  650 mg Oral Q6H PRN Encarnacion Slates, NP   650 mg at 11/02/13 2423  . alum & mag hydroxide-simeth (MAALOX/MYLANTA) 200-200-20 MG/5ML suspension 30 mL  30 mL Oral Q4H PRN Encarnacion Slates, NP      . feeding supplement (GLUCERNA SHAKE) (GLUCERNA SHAKE) liquid 237 mL  237 mL Oral TID BM Nicholaus Bloom, MD   237 mL at 11/03/13 5361  . insulin aspart (novoLOG) injection 0-15 Units  0-15 Units Subcutaneous TID WC Encarnacion Slates, NP   3 Units at 11/03/13 1219  . insulin aspart (novoLOG) injection 4 Units  4 Units Subcutaneous TID WC Encarnacion Slates, NP   4 Units at 11/03/13 1219  . LORazepam (ATIVAN) tablet 1 mg  1 mg Oral Q6H PRN Nicholaus Bloom, MD      . LORazepam (ATIVAN) tablet 1 mg  1 mg Oral TID Woodroe Chen  [name], MD   1 mg at 11/03/13 0827  . magnesium hydroxide (MILK OF MAGNESIA) suspension 30 mL  30 mL Oral Daily PRN Encarnacion Slates, NP      . metFORMIN (GLUCOPHAGE) tablet 1,000 mg  1,000 mg Oral BID WC Encarnacion Slates, NP   1,000 mg at 11/03/13 0827  . traZODone (DESYREL) tablet 100 mg  100 mg Oral QHS PRN Encarnacion Slates, NP   100 mg at 11/01/13 2226    Lab Results:  Results for orders placed during the hospital encounter of 10/31/13 (from the past 48 hour(s))  GLUCOSE, CAPILLARY     Status: Abnormal   Collection Time     11/01/13  4:49 PM      Result Value Ref Range   Glucose-Capillary 247 (*) 70 - 99 mg/dL  GLUCOSE, CAPILLARY     Status: Abnormal   Collection Time    11/01/13  9:50 PM      Result Value Ref Range   Glucose-Capillary 194 (*) 70 - 99 mg/dL   Comment 1 Notify RN     Comment 2 Documented in Chart    GLUCOSE, CAPILLARY     Status: Abnormal   Collection Time    11/02/13  5:55 AM      Result Value Ref Range   Glucose-Capillary 221 (*) 70 - 99 mg/dL  GLUCOSE, CAPILLARY     Status: Abnormal   Collection Time    11/02/13 12:14 PM      Result Value Ref Range   Glucose-Capillary 165 (*) 70 - 99 mg/dL   Comment 1 Notify RN    GLUCOSE, CAPILLARY     Status: Abnormal   Collection Time    11/02/13  5:19 PM      Result Value Ref Range   Glucose-Capillary 278 (*) 70 - 99 mg/dL  GLUCOSE, CAPILLARY     Status: Abnormal   Collection Time    11/02/13  9:08 PM      Result Value Ref Range   Glucose-Capillary 161 (*) 70 - 99 mg/dL  GLUCOSE, CAPILLARY     Status: Abnormal   Collection Time    11/03/13  6:26 AM      Result Value Ref Range   Glucose-Capillary 190 (*) 70 - 99 mg/dL  GLUCOSE, CAPILLARY     Status: Abnormal   Collection Time    11/03/13 12:16 PM      Result Value Ref Range   Glucose-Capillary 194 (*) 70 - 99 mg/dL   Comment 1 Documented in Chart     Comment 2 Notify RN      Physical Findings: AIMS: Facial and Oral Movements Muscles of Facial Expression: None, normal Lips and Perioral Area: None, normal Jaw: None, normal Tongue: None, normal,Extremity Movements Upper (arms, wrists, hands, fingers): None, normal Lower (legs, knees, ankles, toes): None, normal, Trunk Movements Neck, shoulders, hips: None, normal, Overall Severity Severity of abnormal movements (highest score from questions above): None, normal Incapacitation due to abnormal movements: None, normal Patient's awareness of abnormal movements (rate only patient's report): No Awareness, Dental Status Current  problems with teeth and/or dentures?: No Does patient usually wear dentures?: No  CIWA:  CIWA-Ar Total: 2 COWS:     Treatment Plan Summary: Daily contact with patient to assess and evaluate symptoms and progress in treatment Medication management  Plan: Supportive approach/coping skills/relapse prevention           Continue to wean off the Ativan  Medical Decision Making Problem  Points:  Review of psycho-social stressors (1) Data Points:  Review of new medications or change in dosage (2)  I certify that inpatient services furnished can reasonably be expected to improve the patient's condition.   [name],[name] 11/03/2013, 4:21 PM

## 2013-11-03 NOTE — BHH Group Notes (Signed)
[place] Group Notes:  (Nursing/MHT/Case Management/Adjunct)  Date:  11/03/2013  Time:  3:39 PM  Type of Therapy:  Psychoeducational Skills  Participation Level:  Did Not Attend Letta Pate 11/03/2013, 3:39 PM

## 2013-11-03 NOTE — Progress Notes (Signed)
Found patient resting in bed at the start of this writer's shift. When asked about AA which was about to start, pt stated, "Nah, I'm not going. I've been to a million of those. Heck, I've even spoken at them. I don't need." When asked what his plan is to remain sober, pt remarked, "I just won't drink." "There are other ways to stay sober other than AA. Pt continues to say, "I'm detoxing. I'm getting out the poison in me." Has been in bed most of the day, refusing groups. CBG 180. No bed time coverage ordered. Pt medicated per orders. Given support, education regarding the importance of support network to maintain sobriety. Pt minimally receptive. He voices no SI/HI and remains safe. Jamie Kato

## 2013-11-03 NOTE — BHH Group Notes (Signed)
[facility] Group Notes: (Clinical Social Work)   11/03/2013      Type of Therapy:  Group Therapy   Participation Level:  Did Not Attend    Selmer Dominion, LCSW 11/03/2013, 12:44 PM

## 2013-11-03 NOTE — Progress Notes (Signed)
Pt has been in bed all day.  He claims he is not sleeping at night and he refuses to go to groups.  He stated,"I need to get some sleep and I am not going to groups today" informed pt that he was here to detox and to have milieu therapy.  He still refused.  Pt stated,"I am homeless and that is making it hard for me to sleep". Pt has only been up for medications, snacks and meals thus far today.  He denies any depression or hopelessness but admitted anxiety a 6 on his self-inventory.  He denies any S/H ideation or A/V/H.

## 2013-11-03 NOTE — Progress Notes (Signed)
Patient did not attend the evening speaker [employer] meeting. Pt reported having been to ten thousand meetings and remained in his bed.

## 2013-11-03 NOTE — Progress Notes (Signed)
0900 nursing orientation group   The focus of this group is to educate the patient on the purpose and policies of crisis stabilization and provide a format to answer questions about their admission.  The group details unit policies and expectations of patients while admitted.  Pt refused to come group.  He stated,"I didn't sleep last night" Encouraged him to come but he kept refusing

## 2013-11-03 NOTE — Progress Notes (Signed)
D:Pt has been in his room asleep much of the afternoon. Pt was given scheduled Ativan past scheduled time due to pt being in bed asleep. Pt denies any depression or hopelessness. He rates anxiety as a 6 on 1-10 scale with 10 being the most anxious. Pt reports that "the poison is leaving my body," after stating that he has felt tired today.  A:Offered support, encouragement and 15 minute checks. R:Pt denies si and hi. Safety maintained on the unit.

## 2013-11-04 LAB — GLUCOSE, CAPILLARY
GLUCOSE-CAPILLARY: 180 mg/dL — AB (ref 70–99)
GLUCOSE-CAPILLARY: 153 mg/dL — AB (ref 70–99)
Glucose-Capillary: 194 mg/dL — ABNORMAL HIGH (ref 70–99)
Glucose-Capillary: 247 mg/dL — ABNORMAL HIGH (ref 70–99)

## 2013-11-04 MED ORDER — LORAZEPAM 1 MG PO TABS
1.0000 mg | ORAL_TABLET | Freq: Two times a day (BID) | ORAL | Status: DC
  Administered 2013-11-04 – 2013-11-05 (×2): 1 mg via ORAL
  Filled 2013-11-04 (×2): qty 1

## 2013-11-04 NOTE — Progress Notes (Signed)
Pt presents less anxious with clearer thinking this evening. Continues to maintain he does not need AA however did attend 2 groups today per chart. Expressing appreciation for his treatment here. "I would have died if I'd stayed out there. I'm convinced." "Now I just need to figure out where I'm going tomorrow. I can't get in touch with my boss." Pt given support, reassurance and encouragement. Medicated with prn ativan per his request. He denies SI/HI and remains safe. Jamie Kato

## 2013-11-04 NOTE — Plan of Care (Signed)
Problem: Alteration in mood & ability to function due to Goal: STG: Patient verbalizes decreases in signs of withdrawal Outcome: Completed/Met Date Met:  11/04/13 Pt denying withdrawal and appears his present anxiety is at baseline.

## 2013-11-04 NOTE — BHH Group Notes (Signed)
[place] Group Notes:  (Nursing/MHT/Case Management/Adjunct)  Date:  11/04/2013  Time:  1:00 PM  Type of Therapy:  Psychoeducational Skills  Participation Level:  Active  Participation Quality:  Appropriate  Affect:  Appropriate  Cognitive:  Appropriate  Insight:  Appropriate  Engagement in Group:  Engaged  Modes of Intervention:  Discussion  Summary of Progress/Problems: Pt did attend self inventory group, pt reported that he was negative SI/HI, no AH/VH noted. Pt rated his depression as a 0, and his helplessness/hopelessness as a 0.     Pt reported concerns about anxiety, pt advised that the doctor would be made aware.   [name] 11/04/2013, 1:00 PM

## 2013-11-04 NOTE — Progress Notes (Signed)
Pt has been up and going to groups. He denies any depression or hopelessness but rated his anxiety a 6 on his self-inventory. He is reporting tremors and agitation.  He denies any S/H ideations or A/V/H.  He is trying to find placement since he is homeless.  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 He claims he can't use the shelter here in [place] because he has used up his time.  We did discuss the shelter in [facility] and he stated,"I can go to [employer] if I can't get someone to help out"

## 2013-11-04 NOTE — Progress Notes (Signed)
[facility] MD Progress Note  11/04/2013 2:13 PM [name]  MRN:  [id number] Subjective:  Continues to be weaned off the Ativan. States that he is still worried about what is going to happen once he is out of here. He is focused on having to get out and work to have the funds that he needs to pay in order to avoid having to go to jail. He is counting that his "boss" will help him at least temporarily with getting a place or a half way house and on going work Diagnosis:   DSM5: Schizophrenia Disorders:  None Obsessive-Compulsive Disorders:  none Trauma-Stressor Disorders:  none Substance/Addictive Disorders:  Alcohol Related Disorder - Severe (303.90) Depressive Disorders:  Major Depressive Disorder - Moderate (296.22) Total Time spent with patient: 30 minutes  Axis I: Anxiety Disorder NOS and Substance Induced Mood Disorder  ADL's:  Intact  Sleep: Fair  Appetite:  Fair  Suicidal Ideation:  Plan:  denies Intent:  denies Means:  denies Homicidal Ideation:  Plan:  denies Intent:  denies Means:  denies AEB (as evidenced by):  Psychiatric Specialty Exam: Physical Exam  Review of Systems  Constitutional: Negative.   HENT: Negative.   Eyes: Negative.   Respiratory: Negative.   Cardiovascular: Negative.   Gastrointestinal: Negative.   Genitourinary: Negative.   Musculoskeletal: Negative.   Skin: Negative.   Neurological: Negative.   Endo/Heme/Allergies: Negative.   Psychiatric/Behavioral: Positive for substance abuse. The patient is nervous/anxious.     Blood pressure 105/70, pulse 88, temperature 97.9 F (36.6 C), temperature source Oral, resp. rate 16, height 6' (1.829 m), weight 70.761 kg (156 lb), SpO2 98.00%.Body mass index is 21.15 kg/(m^2).  General Appearance: Fairly Groomed  Engineer, water::  Fair  Speech:  Clear and Coherent  Volume:  Normal  Mood:  Anxious and worried  Affect:  anxious, worried  Thought Process:  Coherent and Goal Directed  Orientation:  Full  (Time, Place, and Person)  Thought Content:  symptoms, worries, concerns  Suicidal Thoughts:  No  Homicidal Thoughts:  No  Memory:  Immediate;   Fair Recent;   Fair Remote;   Fair  Judgement:  Fair  Insight:  Present  Psychomotor Activity:  Restlessness  Concentration:  Fair  Recall:  [employer] of Knowledge:NA  Language: Fair  Akathisia:  No  Handed:    AIMS (if indicated):     Assets:  Desire for Improvement  Sleep:  Number of Hours: 5.5   Musculoskeletal: Strength & Muscle Tone: within normal limits Gait & Station: normal Patient leans: N/A  Current Medications: Current Facility-Administered Medications  Medication Dose Route Frequency Provider Last Rate Last Dose  . acetaminophen (TYLENOL) tablet 650 mg  650 mg Oral Q6H PRN Encarnacion Slates, NP   650 mg at 11/02/13 4259  . alum & mag hydroxide-simeth (MAALOX/MYLANTA) 200-200-20 MG/5ML suspension 30 mL  30 mL Oral Q4H PRN Encarnacion Slates, NP   30 mL at 11/03/13 2230  . feeding supplement (GLUCERNA SHAKE) (GLUCERNA SHAKE) liquid 237 mL  237 mL Oral TID BM Nicholaus Bloom, MD   237 mL at 11/04/13 1010  . insulin aspart (novoLOG) injection 0-15 Units  0-15 Units Subcutaneous TID WC Encarnacion Slates, NP   3 Units at 11/04/13 1200  . insulin aspart (novoLOG) injection 4 Units  4 Units Subcutaneous TID WC Encarnacion Slates, NP   4 Units at 11/04/13 1201  . LORazepam (ATIVAN) tablet 1 mg  1 mg Oral Q6H PRN  Nicholaus Bloom, MD   1 mg at 11/03/13 2231  . LORazepam (ATIVAN) tablet 1 mg  1 mg Oral BID Nicholaus Bloom, MD      . magnesium hydroxide (MILK OF MAGNESIA) suspension 30 mL  30 mL Oral Daily PRN Encarnacion Slates, NP      . metFORMIN (GLUCOPHAGE) tablet 1,000 mg  1,000 mg Oral BID WC Encarnacion Slates, NP   1,000 mg at 11/04/13 0749  . traZODone (DESYREL) tablet 100 mg  100 mg Oral QHS PRN Encarnacion Slates, NP   100 mg at 11/01/13 2226    Lab Results:  Results for orders placed during the hospital encounter of 10/31/13 (from the past 48 hour(s))   GLUCOSE, CAPILLARY     Status: Abnormal   Collection Time    11/02/13  5:19 PM      Result Value Ref Range   Glucose-Capillary 278 (*) 70 - 99 mg/dL  GLUCOSE, CAPILLARY     Status: Abnormal   Collection Time    11/02/13  9:08 PM      Result Value Ref Range   Glucose-Capillary 161 (*) 70 - 99 mg/dL  GLUCOSE, CAPILLARY     Status: Abnormal   Collection Time    11/03/13  6:26 AM      Result Value Ref Range   Glucose-Capillary 190 (*) 70 - 99 mg/dL  GLUCOSE, CAPILLARY     Status: Abnormal   Collection Time    11/03/13 12:16 PM      Result Value Ref Range   Glucose-Capillary 194 (*) 70 - 99 mg/dL   Comment 1 Documented in Chart     Comment 2 Notify RN    GLUCOSE, CAPILLARY     Status: Abnormal   Collection Time    11/03/13  4:59 PM      Result Value Ref Range   Glucose-Capillary 185 (*) 70 - 99 mg/dL  GLUCOSE, CAPILLARY     Status: Abnormal   Collection Time    11/03/13  9:21 PM      Result Value Ref Range   Glucose-Capillary 180 (*) 70 - 99 mg/dL   Comment 1 Notify RN     Comment 2 Documented in Chart    GLUCOSE, CAPILLARY     Status: Abnormal   Collection Time    11/04/13  5:51 AM      Result Value Ref Range   Glucose-Capillary 180 (*) 70 - 99 mg/dL   Comment 1 Notify RN    GLUCOSE, CAPILLARY     Status: Abnormal   Collection Time    11/04/13 11:56 AM      Result Value Ref Range   Glucose-Capillary 194 (*) 70 - 99 mg/dL    Physical Findings: AIMS: Facial and Oral Movements Muscles of Facial Expression: None, normal Lips and Perioral Area: None, normal Jaw: None, normal Tongue: None, normal,Extremity Movements Upper (arms, wrists, hands, fingers): None, normal Lower (legs, knees, ankles, toes): None, normal, Trunk Movements Neck, shoulders, hips: None, normal, Overall Severity Severity of abnormal movements (highest score from questions above): None, normal Incapacitation due to abnormal movements: None, normal Patient's awareness of abnormal movements (rate  only patient's report): No Awareness, Dental Status Current problems with teeth and/or dentures?: No Does patient usually wear dentures?: No  CIWA:  CIWA-Ar Total: 2 COWS:     Treatment Plan Summary: Daily contact with patient to assess and evaluate symptoms and progress in treatment Medication management  Plan: Supportive approach/coping skills/relapse  prevention           Decrease the Ativan to 1 mg BID X 2 then one daily then D/C  Medical Decision Making Problem Points:  Review of psycho-social stressors (1) Data Points:  Review of new medications or change in dosage (2)  I certify that inpatient services furnished can reasonably be expected to improve the patient's condition.   LUGO,IRVING A 11/04/2013, 2:13 PM

## 2013-11-04 NOTE — Progress Notes (Signed)
Adult Psychoeducational Group Note  Date:  11/04/2013 Time:  3:15PM  Group Topic/Focus:  Personal Choices and Values:   The focus of this group is to help patients assess and explore the importance of values in their lives, how their values affect their decisions, how they express their values and what opposes their expression.  Participation Level:  Did Not Attend  Additional Comments:  Pt did not attend group. Pt was in the bed asleep during group time  LEA, JANAY K 11/04/2013, 4:07 PM

## 2013-11-04 NOTE — Progress Notes (Signed)
Patient did not attend the evening speaker [place] meeting. Pt was notified that group was beginning but responded, "not today", and remained in bed.

## 2013-11-04 NOTE — BHH Group Notes (Signed)
[facility] Group Notes:  (Clinical Social Work)  11/04/2013  10:00-11:00AM  Summary of Progress/Problems:   The main focus of today's process group was to   identify the patient's current support system and decide on other supports that can be put in place.  The picture on workbook was used to discuss why additional supports are needed.  An emphasis was placed on using counselor, doctor, therapy groups, 12-step groups, and problem-specific support groups to expand supports.   There was also an extensive discussion about what constitutes a healthy support versus an unhealthy support.  The patient expressed full comprehension of the concepts presented, and agreed that there is a need to add more supports.  The patient stated he had supports, but became prideful and started thinking that he could drink a couple of drinks without harming himself.  He has had some positive and some negative experiences in [place] meetings, but did encourage other group members to look around before making a decision.  Type of Therapy:  Process Group with Motivational Interviewing  Participation Level:  Minimal  Participation Quality:  Attentive  Affect:  Blunted and Irritable  Cognitive:  Appropriate  Insight:  Developing/Improving  Engagement in Therapy:  Engaged  Modes of Intervention:   Education, Support and Processing, Activity  [employer], LCSW 11/04/2013, 12:15pm

## 2013-11-04 NOTE — BHH Group Notes (Signed)
[facility] Group Notes:  (Nursing/MHT/Case Management/Adjunct)  Date:  11/04/2013  Time:  2:25 PM  Type of Therapy:  Psychoeducational Skills  Participation Level:  Did Not Attend   Ventura Sellers 11/04/2013, 2:25 PM

## 2013-11-04 NOTE — Plan of Care (Signed)
Problem: Ineffective individual coping Goal: LTG: Patient will report a decrease in negative feelings Outcome: Completed/Met Date Met:  11/04/13 Pt reports feeling much improved. "I feel like I have all of the bad stuff out of me."

## 2013-11-05 DIAGNOSIS — F1020 Alcohol dependence, uncomplicated: Principal | ICD-10-CM

## 2013-11-05 LAB — GLUCOSE, CAPILLARY: Glucose-Capillary: 176 mg/dL — ABNORMAL HIGH (ref 70–99)

## 2013-11-05 MED ORDER — METFORMIN HCL 1000 MG PO TABS: 1000.0000 mg | ORAL_TABLET | Freq: Two times a day (BID) | ORAL | Status: DC

## 2013-11-05 MED ORDER — TRAZODONE HCL 100 MG PO TABS: 100.0000 mg | ORAL_TABLET | Freq: Every evening | ORAL | Status: DC | PRN

## 2013-11-05 NOTE — Discharge Summary (Signed)
Physician Discharge Summary Note  Patient:  [name] is an 58 y.o., male MRN:  [id number] DOB:  January 12, 1956 Patient phone:  [phone] (home)  Patient address:   [place] [address],  Total Time spent with patient: Greater than 30 minutes  Date of Admission:  10/31/2013 Date of Discharge: 11/05/13  Reason for Admission: Alcohol detox  Discharge Diagnoses: Active Problems:   Alcohol dependence  Psychiatric Specialty Exam: Physical Exam  Psychiatric: His speech is normal and behavior is normal. Judgment and thought content normal. His mood appears not anxious. His affect is not angry, not blunt, not labile and not inappropriate. Cognition and memory are normal. He does not exhibit a depressed mood.    Review of Systems  Constitutional: Negative.   HENT: Negative.   Eyes: Negative.   Respiratory: Negative.   Cardiovascular: Negative.   Gastrointestinal: Negative.   Genitourinary: Negative.   Musculoskeletal: Negative.   Skin: Negative.   Neurological: Negative.   Endo/Heme/Allergies: Negative.   Psychiatric/Behavioral: Positive for depression (Stable) and substance abuse. Negative for suicidal ideas (Alcoholism, chronic), hallucinations and memory loss. The patient has insomnia (Stable). The patient is not nervous/anxious.     Blood pressure 133/84, pulse 89, temperature 98.8 F (37.1 C), temperature source Oral, resp. rate 18, height 6' (1.829 m), weight 70.761 kg (156 lb), SpO2 98.00%.Body mass index is 21.15 kg/(m^2).   General Appearance: Fairly Groomed   Engineer, water:: Fair   Speech: Clear and Coherent   Volume: Normal   Mood: Euthymic   Affect: Appropriate   Thought Process: Coherent and Goal Directed   Orientation: Full (Time, Place, and Person)   Thought Content: plan as he moves on, relapse prevention plan   Suicidal Thoughts: No   Homicidal Thoughts: No   Memory: Immediate; Fair  Recent; Fair  Remote; Fair   Judgement:  Fair   Insight: Present   Psychomotor Activity: Normal   Concentration: Fair   Recall: [name]   Language: Fair   Akathisia: No   Handed:   AIMS (if indicated):   Assets: Desire for Improvement  Vocational/Educational   Sleep: Number of Hours: 4.25    Past Psychiatric History: Diagnosis: Alcohol dependence, Substance ibduced mood disorder  Hospitalizations: [facility] adult unit  Outpatient Care: [facility]  Substance Abuse Care: [facility]  Self-Mutilation: NA  Suicidal Attempts: NA  Violent Behaviors: NA   Musculoskeletal: Strength & Muscle Tone: within normal limits Gait & Station: normal Patient leans: N/A  DSM5: Schizophrenia Disorders:  NA Obsessive-Compulsive Disorders:  NA Trauma-Stressor Disorders:  NA Substance/Addictive Disorders:  Alcohol Related Disorder - Severe (303.90) Depressive Disorders:  Substance induced mood disorder  Axis Diagnosis:  AXIS I:  Alcohol dependence, Substance ibduced mood disorder AXIS II:  Deferred AXIS III:   Past Medical History  Diagnosis Date  . Hepatitis C   . Diabetes mellitus   . Chronic hyponatremia     pt denies having   . Alcoholic   . Alcoholic cirrhosis of liver     Pt calls this a false diagnosis. Pt denies  . Thrombocytopenia   . Hyperglycemia   . Depression   . Anxiety    AXIS IV:  other psychosocial or environmental problems and Alcoholism, chronic AXIS V:  62  Level of Care:  OP  Hospital Course:  58 Y/O male who endorses that he has continued to have a hard time. He was last discharged in May 10. He states things did  not work out. He was left out there with no work, no place to stay. He relapsed immediately. He states he needs to get himself together as he has obligations, bills to pay. States he does not want to go to jail. He has been drinking every day, 4 to 6 40's. He states he had come to the ED earlier and told them to let him go as he was wanting to detox himself but he came back as he was  not able to accomplish it. States that after his relapse around Christmas time he has not been able to accomplish longer term sobriety. States that things are getting worst.   This is one of numerous discharge summaries for Mr. [name]. He was admitted to the hospital with reports of recent relapse on alcohol. He reports having been drinking daily 4-6 40s. He was here for alcohol detox treatment. [name]'s recent lab reports indicated elevated liver enzymes. His detoxification treatment was achieved using Ativan detox regimen on a tapering dose format. He is not a candidate for Librium detox protocols. Ativan was used in place of Librium detox protocol because Librium is a long acting benzodiazepine with a long half life. If Librium was used for this detox treatment, will impose on a already compromised liver functions. This way, [name] received a cleaner detoxification treatment without endangering his liver functions any further. He was also enrolled in the group counseling sessions, AA/NA meetings being offered and held on this unit. He participated and learned coping skills. He received other medication regimen for his other medical conditions that he presented. He tolerated his treatment regimen without any significant adverse effects and or reactions.  Besides the detox treatments received here and scheduled outpatient psychiatric services, Mr. [name] also was ordered, received and discharged on Trazodone 50 mg at bedtime for sleep and Metformin 1000 mg bid for diabetes managment. He has completed detox treatment and his mood is stable. This is evidenced by his reports of improved mood and absence of substance withdrawal symptoms. He will resume psychiatric care and routine medication management at the [facility] clinic here in [place], [place]. Patient was encouraged to join/attend AA/NA meetings being offered and held within his community.   Upon discharge, [name] adamantly denies any suicidal,  homicidal ideations, auditory, visual hallucinations, delusional thoughts, paranoia and or withdrawal symptoms. He left [facility] with all personal belongings in no apparent distress. He received 14 days worth supply samples of his discharge medications provided by [facility] pharmacy. Transportation per city bus. Bus pass provided by [facility].  Consults:  psychiatry  Significant Diagnostic Studies:  labs: CBC with diff, CMP, UDS, toxicology tests, U/A  Discharge Vitals:   Blood pressure 133/84, pulse 89, temperature 98.8 F (37.1 C), temperature source Oral, resp. rate 18, height 6' (1.829 m), weight 70.761 kg (156 lb), SpO2 98.00%. Body mass index is 21.15 kg/(m^2). Lab Results:   Results for orders placed during the hospital encounter of 10/31/13 (from the past 72 hour(s))  GLUCOSE, CAPILLARY     Status: Abnormal   Collection Time    11/02/13  5:19 PM      Result Value Ref Range   Glucose-Capillary 278 (*) 70 - 99 mg/dL  GLUCOSE, CAPILLARY     Status: Abnormal   Collection Time    11/02/13  9:08 PM      Result Value Ref Range   Glucose-Capillary 161 (*) 70 - 99 mg/dL  GLUCOSE, CAPILLARY     Status: Abnormal   Collection Time  11/03/13  6:26 AM      Result Value Ref Range   Glucose-Capillary 190 (*) 70 - 99 mg/dL  GLUCOSE, CAPILLARY     Status: Abnormal   Collection Time    11/03/13 12:16 PM      Result Value Ref Range   Glucose-Capillary 194 (*) 70 - 99 mg/dL   Comment 1 Documented in Chart     Comment 2 Notify RN    GLUCOSE, CAPILLARY     Status: Abnormal   Collection Time    11/03/13  4:59 PM      Result Value Ref Range   Glucose-Capillary 185 (*) 70 - 99 mg/dL  GLUCOSE, CAPILLARY     Status: Abnormal   Collection Time    11/03/13  9:21 PM      Result Value Ref Range   Glucose-Capillary 180 (*) 70 - 99 mg/dL   Comment 1 Notify RN     Comment 2 Documented in Chart    GLUCOSE, CAPILLARY     Status: Abnormal   Collection Time    11/04/13  5:51 AM      Result Value Ref Range    Glucose-Capillary 180 (*) 70 - 99 mg/dL   Comment 1 Notify RN    GLUCOSE, CAPILLARY     Status: Abnormal   Collection Time    11/04/13 11:56 AM      Result Value Ref Range   Glucose-Capillary 194 (*) 70 - 99 mg/dL  GLUCOSE, CAPILLARY     Status: Abnormal   Collection Time    11/04/13  5:17 PM      Result Value Ref Range   Glucose-Capillary 247 (*) 70 - 99 mg/dL  GLUCOSE, CAPILLARY     Status: Abnormal   Collection Time    11/04/13  9:04 PM      Result Value Ref Range   Glucose-Capillary 153 (*) 70 - 99 mg/dL   Comment 1 Notify RN     Comment 2 Documented in Chart    GLUCOSE, CAPILLARY     Status: Abnormal   Collection Time    11/05/13  5:54 AM      Result Value Ref Range   Glucose-Capillary 176 (*) 70 - 99 mg/dL    Physical Findings: AIMS: Facial and Oral Movements Muscles of Facial Expression: None, normal Lips and Perioral Area: None, normal Jaw: None, normal Tongue: None, normal,Extremity Movements Upper (arms, wrists, hands, fingers): None, normal Lower (legs, knees, ankles, toes): None, normal, Trunk Movements Neck, shoulders, hips: None, normal, Overall Severity Severity of abnormal movements (highest score from questions above): None, normal Incapacitation due to abnormal movements: None, normal Patient's awareness of abnormal movements (rate only patient's report): No Awareness, Dental Status Current problems with teeth and/or dentures?: No Does patient usually wear dentures?: No  CIWA:  CIWA-Ar Total: 2 COWS:     Psychiatric Specialty Exam: See Psychiatric Specialty Exam and Suicide Risk Assessment completed by Attending Physician prior to discharge.  Discharge destination:  Home  Is patient on multiple antipsychotic therapies at discharge:  No   Has Patient had three or more failed trials of antipsychotic monotherapy by history:  No  Recommended Plan for Multiple Antipsychotic Therapies: NA    Medication List    STOP taking these medications        insulin glargine 100 UNIT/ML injection  Commonly known as:  LANTUS      TAKE these medications     Indication   metFORMIN 1000 MG tablet  Commonly known as:  GLUCOPHAGE  Take 1 tablet (1,000 mg total) by mouth 2 (two) times daily with a meal. For diabetes management   Indication:  Type 2 Diabetes     traZODone 100 MG tablet  Commonly known as:  DESYREL  Take 1 tablet (100 mg total) by mouth at bedtime as needed for sleep.   Indication:  Trouble Sleeping       Follow-up Information   Follow up with [facility] On 11/06/2013. (Walk in between 8am-9am Monday through Friday for hospital follow-up/assessment for services if interested. )    Contact information:   [address] [place] [address] Phone: [phone] Fax: [phone]     Follow-up recommendations: Activity:  As tolerated Diet: As recommended by your primary care doctor. Keep all scheduled follow-up appointments as recommended.    Comments: Take all your medications as prescribed by your mental healthcare provider. Report any adverse effects and or reactions from your medicines to your outpatient provider promptly. Patient is instructed and cautioned to not engage in alcohol and or illegal drug use while on prescription medicines. In the event of worsening symptoms, patient is instructed to call the crisis hotline, 911 and or go to the nearest ED for appropriate evaluation and treatment of symptoms. Follow-up with your primary care provider for your other medical issues, concerns and or health care needs.   Total Discharge Time:  Greater than 30 minutes.  Signed: Encarnacion Slates, PMHNP-BC 11/05/2013, 1:52 PM  I agree with assessment and plan Geralyn Flash A. [name], M.D.

## 2013-11-05 NOTE — Progress Notes (Signed)
[facility] Adult Case Management Discharge Plan :  Will you be returning to the same living situation after discharge: Yes,  pt was homeless prior to admission and will continue to be homeless.  Pt plans to stay with a friend tonight and work tomorrow to afford a hotel room.  Pt familiar with resources in the community.   At discharge, do you have transportation home?:Yes,  provided pt with bus passes Do you have the ability to pay for your medications:Yes,  provided pt with samples and prescriptions and referred pt to [facility] for assistance with meds  Release of information consent forms completed and in the chart;  Patient's signature needed at discharge.  Patient to Follow up at: Follow-up Information   Follow up with [facility] On 11/06/2013. (Walk in between 8am-9am Monday through Friday for hospital follow-up/assessment for services if interested. )    Contact information:   [address], [place] [address] Phone: [phone] Fax: [phone]      Patient denies SI/HI:   Yes,  denies SI/HI    Safety Planning and Suicide Prevention discussed:  Yes,  disucssed with pt.  N/A to contact family/friend due to no SI on admission.  See suicide prevention education note.   Ane Payment 11/05/2013, 9:47 AM

## 2013-11-05 NOTE — Progress Notes (Signed)
Pt d/c from the hospital with a bus pass. All items returned. D/C instructions given. Pt refused prescription and sample for Trazadone. Pt denies si and hi.

## 2013-11-05 NOTE — BHH Group Notes (Signed)
[facility] LCSW Aftercare Discharge Planning Group Note   11/05/2013 8:45 AM  Participation Quality:  Alert, Appropriate and Oriented  Mood/Affect:  Calm  Depression Rating:  0  Anxiety Rating:  5  Thoughts of Suicide:  Pt denies SI/HI  Will you contract for safety?   Yes  Current AVH:  Pt denies  Plan for Discharge/Comments:  Pt attended discharge planning group and actively participated in group.  CSW provided pt with today's workbook.  Pt reports feeling well today and ready to d/c.  Pt states that he will stay with a friend tonight and plans to work tomorrow, to afford a place to stay after that.  Pt stays in [place] and has follow up scheduled at [name] for outpatient medication management and therapy.  No further needs voiced by pt at this time.    Transportation Means: Pt reports access to transportation - provided pt with bus passes  Supports: No supports mentioned at this time  [name] Lemming, LCSW 11/05/2013 9:46 AM

## 2013-11-05 NOTE — BHH Suicide Risk Assessment (Signed)
Suicide Risk Assessment  Discharge Assessment     Demographic Factors:  Male and Caucasian  Total Time spent with patient: 45 minutes  Psychiatric Specialty Exam:     Blood pressure 133/84, pulse 89, temperature 98.8 F (37.1 C), temperature source Oral, resp. rate 18, height 6' (1.829 m), weight 70.761 kg (156 lb), SpO2 98.00%.Body mass index is 21.15 kg/(m^2).  General Appearance: Fairly Groomed  Engineer, water::  Fair  Speech:  Clear and Coherent  Volume:  Normal  Mood:  Euthymic  Affect:  Appropriate  Thought Process:  Coherent and Goal Directed  Orientation:  Full (Time, Place, and Person)  Thought Content:  plan as he moves on, relapse prevention plan  Suicidal Thoughts:  No  Homicidal Thoughts:  No  Memory:  Immediate;   Fair Recent;   Fair Remote;   Fair  Judgement:  Fair  Insight:  Present  Psychomotor Activity:  Normal  Concentration:  Fair  Recall:  [employer] of Knowledge:NA  Language: Fair  Akathisia:  No  Handed:    AIMS (if indicated):     Assets:  Desire for Improvement Vocational/Educational  Sleep:  Number of Hours: 4.25    Musculoskeletal: Strength & Muscle Tone: within normal limits Gait & Station: normal Patient leans: N/A   Mental Status Per Nursing Assessment::   On Admission:     Current Mental Status by Physician: In full contact with reality. There are no active S/S of withdrawal. He states he is ready to go and get his boss to help him find a place and get him some work so he can make the money he needs to make to pay what he owes. States he is committed to sobrieety   Loss Factors: Financial problems/change in socioeconomic status  Historical Factors: NA  Risk Reduction Factors:   Sense of responsibility to family and Positive social support  Continued Clinical Symptoms:  Alcohol/Substance Abuse/Dependencies  Cognitive Features That Contribute To Risk:  Closed-mindedness Polarized thinking Thought constriction (tunnel  vision)    Suicide Risk:  Minimal: No identifiable suicidal ideation.  Patients presenting with no risk factors but with morbid ruminations; may be classified as minimal risk based on the severity of the depressive symptoms  Discharge Diagnoses:   AXIS I:  Alcohol Dependence, S/P alcohol withdrawal, Substance Induced Mood Disorder, Anxiety Disorder NOS AXIS II:  No diagnosis AXIS III:   Past Medical History  Diagnosis Date  . Hepatitis C   . Diabetes mellitus   . Chronic hyponatremia     pt denies having   . Alcoholic   . Alcoholic cirrhosis of liver     Pt calls this a false diagnosis. Pt denies  . Thrombocytopenia   . Hyperglycemia   . Depression   . Anxiety    AXIS IV:  other psychosocial or environmental problems AXIS V:  61-70 mild symptoms  Plan Of Care/Follow-up recommendations:  Activity:  as tolerated Diet:  regular Follow up outpatient basis Is patient on multiple antipsychotic therapies at discharge:  No   Has Patient had three or more failed trials of antipsychotic monotherapy by history:  No  Recommended Plan for Multiple Antipsychotic Therapies: NA    [name] A 11/05/2013, 10:31 AM

## 2013-11-08 NOTE — Progress Notes (Signed)
Patient Discharge Instructions:  After Visit Summary (AVS):   Faxed to:  11/08/13 Discharge Summary Note:   Faxed to:  11/08/13 Psychiatric Admission Assessment Note:   Faxed to:  11/08/13 Suicide Risk Assessment - Discharge Assessment:   Faxed to:  11/08/13 Faxed/Sent to the Next Level Care provider:  11/08/13 Faxed to [facility] @ [place], 11/08/2013, 2:38 PM

## 2013-12-28 ENCOUNTER — Encounter (HOSPITAL_COMMUNITY): Payer: Self-pay | Admitting: Emergency Medicine

## 2013-12-28 ENCOUNTER — Emergency Department (HOSPITAL_COMMUNITY)
Admission: EM | Admit: 2013-12-28 | Discharge: 2013-12-29 | Disposition: A | Discharge status: Home / Self Care | Payer: Self-pay | Attending: Emergency Medicine | Admitting: Emergency Medicine

## 2013-12-28 DIAGNOSIS — F411 Generalized anxiety disorder: Secondary | ICD-10-CM | POA: Insufficient documentation

## 2013-12-28 DIAGNOSIS — F17200 Nicotine dependence, unspecified, uncomplicated: Secondary | ICD-10-CM | POA: Insufficient documentation

## 2013-12-28 DIAGNOSIS — Z91148 Patient's other noncompliance with medication regimen for other reason: Secondary | ICD-10-CM

## 2013-12-28 DIAGNOSIS — F329 Major depressive disorder, single episode, unspecified: Secondary | ICD-10-CM | POA: Insufficient documentation

## 2013-12-28 DIAGNOSIS — Z8719 Personal history of other diseases of the digestive system: Secondary | ICD-10-CM | POA: Insufficient documentation

## 2013-12-28 DIAGNOSIS — Z9114 Patient's other noncompliance with medication regimen: Secondary | ICD-10-CM

## 2013-12-28 DIAGNOSIS — E119 Type 2 diabetes mellitus without complications: Secondary | ICD-10-CM | POA: Insufficient documentation

## 2013-12-28 DIAGNOSIS — F1010 Alcohol abuse, uncomplicated: Secondary | ICD-10-CM

## 2013-12-28 DIAGNOSIS — R739 Hyperglycemia, unspecified: Secondary | ICD-10-CM

## 2013-12-28 DIAGNOSIS — Z79899 Other long term (current) drug therapy: Secondary | ICD-10-CM | POA: Insufficient documentation

## 2013-12-28 DIAGNOSIS — Z91199 Patient's noncompliance with other medical treatment and regimen due to unspecified reason: Secondary | ICD-10-CM | POA: Insufficient documentation

## 2013-12-28 DIAGNOSIS — Z9119 Patient's noncompliance with other medical treatment and regimen: Secondary | ICD-10-CM | POA: Insufficient documentation

## 2013-12-28 DIAGNOSIS — F3289 Other specified depressive episodes: Secondary | ICD-10-CM | POA: Insufficient documentation

## 2013-12-28 DIAGNOSIS — Z8619 Personal history of other infectious and parasitic diseases: Secondary | ICD-10-CM | POA: Insufficient documentation

## 2013-12-28 LAB — CBC
HEMATOCRIT: 34.6 % — AB (ref 39.0–52.0)
Hemoglobin: 12.6 g/dL — ABNORMAL LOW (ref 13.0–17.0)
MCH: 35.8 pg — ABNORMAL HIGH (ref 26.0–34.0)
MCHC: 36.4 g/dL — ABNORMAL HIGH (ref 30.0–36.0)
MCV: 98.3 fL (ref 78.0–100.0)
Platelets: 96 10*3/uL — ABNORMAL LOW (ref 150–400)
RBC: 3.52 MIL/uL — AB (ref 4.22–5.81)
RDW: 13.5 % (ref 11.5–15.5)
WBC: 4.2 10*3/uL (ref 4.0–10.5)

## 2013-12-28 LAB — COMPREHENSIVE METABOLIC PANEL
ALT: 103 U/L — ABNORMAL HIGH (ref 0–53)
ANION GAP: 14 (ref 5–15)
AST: 135 U/L — ABNORMAL HIGH (ref 0–37)
Albumin: 3.1 g/dL — ABNORMAL LOW (ref 3.5–5.2)
Alkaline Phosphatase: 210 U/L — ABNORMAL HIGH (ref 39–117)
BILIRUBIN TOTAL: 0.4 mg/dL (ref 0.3–1.2)
BUN: 8 mg/dL (ref 6–23)
CO2: 24 meq/L (ref 19–32)
CREATININE: 0.61 mg/dL (ref 0.50–1.35)
Calcium: 9.1 mg/dL (ref 8.4–10.5)
Chloride: 95 mEq/L — ABNORMAL LOW (ref 96–112)
GFR calc non Af Amer: >90 mL/min (ref >90)
GLUCOSE: 461 mg/dL — AB (ref 70–99)
Potassium: 4.2 mEq/L (ref 3.7–5.3)
Sodium: 133 mEq/L — ABNORMAL LOW (ref 137–147)
Total Protein: 7.4 g/dL (ref 6.0–8.3)

## 2013-12-28 LAB — RAPID URINE DRUG SCREEN, HOSP PERFORMED
Amphetamines: NOT DETECTED
Barbiturates: NOT DETECTED
Benzodiazepines: NOT DETECTED
Cocaine: NOT DETECTED
OPIATES: NOT DETECTED
Tetrahydrocannabinol: NOT DETECTED

## 2013-12-28 LAB — URINE MICROSCOPIC-ADD ON: Urine-Other: NONE SEEN

## 2013-12-28 LAB — ACETAMINOPHEN LEVEL: Acetaminophen (Tylenol), Serum: <15 ug/mL (ref 10–30)

## 2013-12-28 LAB — URINALYSIS, ROUTINE W REFLEX MICROSCOPIC
Bilirubin Urine: NEGATIVE
Hgb urine dipstick: NEGATIVE
Ketones, ur: NEGATIVE mg/dL
Leukocytes, UA: NEGATIVE
NITRITE: NEGATIVE
PROTEIN: NEGATIVE mg/dL
Specific Gravity, Urine: 1.021 (ref 1.005–1.030)
Urobilinogen, UA: 1 mg/dL (ref 0.0–1.0)
pH: 6 (ref 5.0–8.0)

## 2013-12-28 LAB — CBG MONITORING, ED
Glucose-Capillary: 415 mg/dL — ABNORMAL HIGH (ref 70–99)
Glucose-Capillary: 435 mg/dL — ABNORMAL HIGH (ref 70–99)

## 2013-12-28 LAB — ETHANOL: Alcohol, Ethyl (B): 341 mg/dL — ABNORMAL HIGH (ref 0–11)

## 2013-12-28 LAB — SALICYLATE LEVEL: Salicylate Lvl: <2 mg/dL — ABNORMAL LOW (ref 2.8–20.0)

## 2013-12-28 MED ORDER — THIAMINE HCL 100 MG/ML IJ SOLN
100.0000 mg | Freq: Every day | INTRAMUSCULAR | Status: DC
Start: 2013-12-28 — End: 2013-12-29

## 2013-12-28 MED ORDER — LORAZEPAM 1 MG PO TABS: 0.0000 mg | ORAL_TABLET | Freq: Two times a day (BID) | ORAL | Status: DC

## 2013-12-28 MED ORDER — LORAZEPAM 2 MG/ML IJ SOLN: 0.0000 mg | Freq: Two times a day (BID) | INTRAMUSCULAR | Status: DC

## 2013-12-28 MED ORDER — LORAZEPAM 2 MG/ML IJ SOLN: 0.0000 mg | Freq: Four times a day (QID) | INTRAMUSCULAR | Status: DC

## 2013-12-28 MED ORDER — SODIUM CHLORIDE 0.9 % IV BOLUS (SEPSIS)
1000.0000 mL | Freq: Once | INTRAVENOUS | Status: AC
  Administered 2013-12-28: 1000 mL via INTRAVENOUS

## 2013-12-28 MED ORDER — VITAMIN B-1 100 MG PO TABS
100.0000 mg | ORAL_TABLET | Freq: Every day | ORAL | Status: DC
  Administered 2013-12-28: 100 mg via ORAL
  Filled 2013-12-28: qty 1

## 2013-12-28 MED ORDER — LORAZEPAM 1 MG PO TABS: 0.0000 mg | ORAL_TABLET | Freq: Four times a day (QID) | ORAL | Status: DC

## 2013-12-28 NOTE — ED Notes (Addendum)
Per EMS, Pt, picked up from [place], presents wanting ETOH detox and hyperglycemia.  EMS reports they were called out for a "seizure."  Sts when they arrived, the manager informed them that the Pt had walked up to the counter, told him that he was having a seizure, and sat back down.  Denies SI and HI.  Pt reports he came in today, because he has been busy and "didn't want to die on the side of the road."  Sts he just doesn't feel good.  Hx of DM, Hep C, cirrhosis, depression, anxiety.

## 2013-12-28 NOTE — ED Provider Notes (Signed)
CSN: [id number]     Arrival date & time 12/28/13  1833 History   First MD Initiated Contact with Patient 12/28/13 1857     Chief Complaint  Patient presents with  . ETOH Detox   . Hyperglycemia     (Consider location/radiation/quality/duration/timing/severity/associated sxs/prior Treatment) The history is provided by the patient and medical records.   This is a 58 year old male with past medical history significant for depression, anxiety, cirrhosis, diabetes, hepatitis C, alcohol abuse, presenting to the ED for alcohol detox and hyperglycemia.  States he was sober until 2 weeks ago, had been detoxing at home with ativan.  States since this time he has returned back to work but has felt his sugar slowing start "creeping back up again".  Patient is usually on metformin but admits to skipping doses.  States he has been off his meds "long enough to make my sugar into the 400's", cannot give me a date of his last dose.  States whenever his sugar gets "out of whack" it drives him to drink.  States he drank 2 40oz beers today.  Denies illicit drug use.  Denies suicidal or homicidal ideation.  Denies auditory or visual hallucinations.  Patient states he does not want to enter a rehab facility, but was afraid to detox at home without ativan as he may have seizures.  Patient denies any chest pain, shortness of breath, palpitations, dizziness, weakness, tremors, syncope, abdominal pain, nausea, vomiting, diarrhea, fever, or chills.  Past Medical History  Diagnosis Date  . Hepatitis C   . Diabetes mellitus   . Chronic hyponatremia     pt denies having   . Alcoholic   . Alcoholic cirrhosis of liver     Pt calls this a false diagnosis. Pt denies  . Thrombocytopenia   . Hyperglycemia   . Depression   . Anxiety    Past Surgical History  Procedure Laterality Date  . Plate in lower right leg  2008  . Fracture surgery  right lower leg plate placed years ago   Family History  Problem Relation Age of  Onset  . ALS Father    History  Substance Use Topics  . Smoking status: Current Every Day Smoker -- 1.00 packs/day for 40 years    Types: Cigarettes  . Smokeless tobacco: Never Used  . Alcohol Use: 9.0 oz/week    15 Cans of beer per week     Comment: 18/daily    Review of Systems  Endocrine:       Hyperglycemia  Psychiatric/Behavioral:       Detox  All other systems reviewed and are negative.   Allergies  Benadryl; Librium; and Sulfa antibiotics  Home Medications   Prior to Admission medications   Medication Sig Start Date End Date Taking? Authorizing Provider  metFORMIN (GLUCOPHAGE) 1000 MG tablet Take 1 tablet (1,000 mg total) by mouth 2 (two) times daily with a meal. For diabetes management 11/05/13   Encarnacion Slates, NP  traZODone (DESYREL) 100 MG tablet Take 1 tablet (100 mg total) by mouth at bedtime as needed for sleep. 11/05/13   Encarnacion Slates, NP   BP 103/63  Pulse 99  Temp(Src) 98.3 F (36.8 C) (Oral)  Resp 16  SpO2 97%  Physical Exam  Nursing note and vitals reviewed. Constitutional: He is oriented to person, place, and time. He appears well-developed and well-nourished.  Intoxicated  HENT:  Head: Normocephalic and atraumatic.  Mouth/Throat: Oropharynx is clear and moist.  Eyes: Conjunctivae, EOM  and lids are normal. Pupils are equal, round, and reactive to light.  Pupils pinpoint but reactive  Neck: Normal range of motion.  Cardiovascular: Normal rate, regular rhythm and normal heart sounds.   Pulmonary/Chest: Effort normal and breath sounds normal. No respiratory distress. He has no wheezes.  Abdominal: Soft. Bowel sounds are normal.  Musculoskeletal: Normal range of motion.  Neurological: He is alert and oriented to person, place, and time. He displays no tremor. He displays no seizure activity.  Pt awake and alert; following commands and answering questions when prompted; no tremors or seizure activity  Skin: Skin is warm and dry.  Psychiatric: He has  a normal mood and affect. He is not withdrawn and not actively hallucinating. Thought content is not delusional. He expresses no homicidal and no suicidal ideation. He expresses no suicidal plans and no homicidal plans.    ED Course  Procedures (including critical care time) Labs Review Labs Reviewed  CBC - Abnormal; Notable for the following:    RBC 3.52 (*)    Hemoglobin 12.6 (*)    HCT 34.6 (*)    MCH 35.8 (*)    MCHC 36.4 (*)    Platelets 96 (*)    All other components within normal limits  COMPREHENSIVE METABOLIC PANEL - Abnormal; Notable for the following:    Sodium 133 (*)    Chloride 95 (*)    Glucose, Bld 461 (*)    Albumin 3.1 (*)    AST 135 (*)    ALT 103 (*)    Alkaline Phosphatase 210 (*)    All other components within normal limits  URINALYSIS, ROUTINE W REFLEX MICROSCOPIC - Abnormal; Notable for the following:    Glucose, UA >1000 (*)    All other components within normal limits  ETHANOL - Abnormal; Notable for the following:    Alcohol, Ethyl (B) 341 (*)    All other components within normal limits  SALICYLATE LEVEL - Abnormal; Notable for the following:    Salicylate Lvl <7.1 (*)    All other components within normal limits  CBG MONITORING, ED - Abnormal; Notable for the following:    Glucose-Capillary 415 (*)    All other components within normal limits  CBG MONITORING, ED - Abnormal; Notable for the following:    Glucose-Capillary 435 (*)    All other components within normal limits  ACETAMINOPHEN LEVEL  URINE RAPID DRUG SCREEN (HOSP PERFORMED)  URINE MICROSCOPIC-ADD ON    Imaging Review No results found.   EKG Interpretation None      MDM   Final diagnoses:  Alcohol abuse  Hyperglycemia  Noncompliance with medications   58 year old male well known to the emergency department for alcohol abuse. On arrival, he is intoxicated and complaining of hyperglycemia. His CBG is over 400 on arrival. On exam he has no tremors or seizure activity and  does not appear to be withdrawing from alcohol at this time. He denies any suicidal or homicidal ideation. He denies any auditory or visual hallucinations. Patient has made it very clear that he does not wish to attend a detox program, he just wants some ativan so he may detox at home.  Labs obtained-- ethanol 341, AST/ALT and alk phos elevated and appear baseline for patient when compared with previous.  Bicarb and anion gap remain WNL.  Patient given IVF, thiamine, and ativan.  He is currently sleeping in his room in NAD.    Care signed out to NP Olean Ree at shift change.  Will d/c once able to ambulate with steady gait.  I have written for patient's home metformin as well as short supply of ativan.  He was once again encouraged to refrain from alcohol use.  Larene Pickett, PA-C 12/29/13 [place], PA-C 12/29/13 [phone]

## 2013-12-28 NOTE — ED Notes (Signed)
Pt reports ETOH abuse x "all my life" and binge drinking x "2 months."  Pt reports he "had some time off, so I came in today."  Last drink x 1 hour ago and admits to drinking 2- 65s.  Pt is afraid that he'll have seizures, if he detoxs w/o Ativan.  Pt reports not taking DM medication x 1 month.  Pt reports weakness and "feeling like sh*t."

## 2013-12-28 NOTE — ED Notes (Signed)
Bed: [facility]4 Expected date:  Expected time:  Means of arrival:  Comments: ems- detox

## 2013-12-28 NOTE — Progress Notes (Signed)
  CARE MANAGEMENT ED NOTE 12/28/2013  Patient:  [name]   Account Number:  [id number]  Date Initiated:  12/28/2013  Documentation initiated by:  Jackelyn Poling  Subjective/Objective Assessment:   58 yr old self pay [place] pt  picked up from [place], presents wanting ETOH detox and hyperglycemia. EMS reports they were called out for a "seizure."  Sts when they arrived, the manager informed them that the Pt had walked up to     Subjective/Objective Assessment Detail:   the counter, told him that he was having a seizure, and sat back down. requesting detox    with 5 [facility] ed visits and 2 admissions Last admission on 10/31/13 to [facility] hall for alcohol dependency     Action/Plan:   ED CM called pt name x 3 he did not respond Noted rise and fall of pt chest Pt asleep Cm left self pay resources for him in a pt belonging bag in his room on top of his clothes to assist with finding a pcp   Action/Plan Detail:   Anticipated DC Date:  12/29/2013     Status Recommendation to Physician:   Result of Recommendation:    Other ED [place]  Other  PCP issues  Outpatient Services - Pt will follow up    Choice offered to / List presented to:            Status of service:  Completed, signed off  ED Comments:   ED Comments Detail:  Follow-up With Details Comments Contact Info Use the list of self pay family doctors and resources provided by case manager to assist with getting a family doctor  Schedule an appointment as soon as possible for a visit on 12/31/2013 As needed & recommended by the emergency room doctor/nurse

## 2013-12-29 LAB — CBG MONITORING, ED: GLUCOSE-CAPILLARY: 285 mg/dL — AB (ref 70–99)

## 2013-12-29 MED ORDER — METFORMIN HCL 1000 MG PO TABS: 1000.0000 mg | ORAL_TABLET | Freq: Two times a day (BID) | ORAL | Status: DC

## 2013-12-29 MED ORDER — LORAZEPAM 1 MG PO TABS: 1.0000 mg | ORAL_TABLET | Freq: Three times a day (TID) | ORAL | Status: DC | PRN

## 2013-12-29 NOTE — Discharge Instructions (Signed)
Start taking your metformin as directed. Stop drinking alcohol.  Take ativan as needed. Follow-up with a primary care physician.  Use list that was given to you in the ED to help with this. Return to the ED for new or worsening symptoms.

## 2013-12-29 NOTE — ED Provider Notes (Signed)
Patient slept most of the night.  He's been up ambulating in the hallway.  He does not have any outward sign of DTs or alcohol withdrawal.  He denies suicidality or homicidality, will be discharged him  Garald Balding, NP 12/29/13 [phone]

## 2014-01-01 NOTE — ED Provider Notes (Signed)
Medical screening examination/treatment/procedure(s) were conducted as a shared visit with non-physician practitioner(s) and myself.  I personally evaluated the patient during the encounter.   EKG Interpretation None      Pt seen and evaluated.  Sleeping.  Normal exam with exception of mild tremor.  C/O "withdrawl.  Agree with treatment, plan, and disposition when ambulatory.  Tanna Furry, MD 01/01/14 [phone]

## 2014-01-01 NOTE — ED Provider Notes (Signed)
Medical screening examination/treatment/procedure(s) were performed by non-physician practitioner and as supervising physician I was immediately available for consultation/collaboration.   EKG Interpretation None        Tanna Furry, MD 01/01/14 [phone]

## 2014-01-14 ENCOUNTER — Emergency Department (HOSPITAL_COMMUNITY)
Admission: EM | Admit: 2014-01-14 | Discharge: 2014-01-15 | Disposition: A | Discharge status: Home / Self Care | Payer: Self-pay | Attending: Emergency Medicine | Admitting: Emergency Medicine

## 2014-01-14 ENCOUNTER — Encounter (HOSPITAL_COMMUNITY): Payer: Self-pay | Admitting: Emergency Medicine

## 2014-01-14 DIAGNOSIS — E119 Type 2 diabetes mellitus without complications: Secondary | ICD-10-CM | POA: Insufficient documentation

## 2014-01-14 DIAGNOSIS — F1994 Other psychoactive substance use, unspecified with psychoactive substance-induced mood disorder: Secondary | ICD-10-CM | POA: Diagnosis present

## 2014-01-14 DIAGNOSIS — F17200 Nicotine dependence, unspecified, uncomplicated: Secondary | ICD-10-CM | POA: Insufficient documentation

## 2014-01-14 DIAGNOSIS — Z8619 Personal history of other infectious and parasitic diseases: Secondary | ICD-10-CM | POA: Insufficient documentation

## 2014-01-14 DIAGNOSIS — Z8719 Personal history of other diseases of the digestive system: Secondary | ICD-10-CM | POA: Insufficient documentation

## 2014-01-14 DIAGNOSIS — Z0489 Encounter for examination and observation for other specified reasons: Secondary | ICD-10-CM | POA: Insufficient documentation

## 2014-01-14 DIAGNOSIS — F1010 Alcohol abuse, uncomplicated: Secondary | ICD-10-CM

## 2014-01-14 DIAGNOSIS — Z79899 Other long term (current) drug therapy: Secondary | ICD-10-CM | POA: Insufficient documentation

## 2014-01-14 DIAGNOSIS — F1020 Alcohol dependence, uncomplicated: Secondary | ICD-10-CM | POA: Insufficient documentation

## 2014-01-14 DIAGNOSIS — F411 Generalized anxiety disorder: Secondary | ICD-10-CM | POA: Insufficient documentation

## 2014-01-14 DIAGNOSIS — Z862 Personal history of diseases of the blood and blood-forming organs and certain disorders involving the immune mechanism: Secondary | ICD-10-CM | POA: Insufficient documentation

## 2014-01-14 DIAGNOSIS — F3289 Other specified depressive episodes: Secondary | ICD-10-CM | POA: Insufficient documentation

## 2014-01-14 DIAGNOSIS — F10230 Alcohol dependence with withdrawal, uncomplicated: Secondary | ICD-10-CM

## 2014-01-14 DIAGNOSIS — F329 Major depressive disorder, single episode, unspecified: Secondary | ICD-10-CM | POA: Insufficient documentation

## 2014-01-14 LAB — COMPREHENSIVE METABOLIC PANEL
ALT: 93 U/L — AB (ref 0–53)
AST: 107 U/L — AB (ref 0–37)
Albumin: 3.5 g/dL (ref 3.5–5.2)
Alkaline Phosphatase: 121 U/L — ABNORMAL HIGH (ref 39–117)
Anion gap: 16 — ABNORMAL HIGH (ref 5–15)
BUN: 8 mg/dL (ref 6–23)
CALCIUM: 9.6 mg/dL (ref 8.4–10.5)
CHLORIDE: 82 meq/L — AB (ref 96–112)
CO2: 27 mEq/L (ref 19–32)
Creatinine, Ser: 0.54 mg/dL (ref 0.50–1.35)
GFR calc Af Amer: >90 mL/min (ref >90)
GFR calc non Af Amer: >90 mL/min (ref >90)
Glucose, Bld: 207 mg/dL — ABNORMAL HIGH (ref 70–99)
Potassium: 4.9 mEq/L (ref 3.7–5.3)
SODIUM: 125 meq/L — AB (ref 137–147)
Total Bilirubin: 0.7 mg/dL (ref 0.3–1.2)
Total Protein: 8.5 g/dL — ABNORMAL HIGH (ref 6.0–8.3)

## 2014-01-14 LAB — RAPID URINE DRUG SCREEN, HOSP PERFORMED
AMPHETAMINES: NOT DETECTED
BENZODIAZEPINES: NOT DETECTED
Barbiturates: NOT DETECTED
COCAINE: NOT DETECTED
Opiates: NOT DETECTED
Tetrahydrocannabinol: NOT DETECTED

## 2014-01-14 LAB — CBC
HCT: 38.6 % — ABNORMAL LOW (ref 39.0–52.0)
Hemoglobin: 14.1 g/dL (ref 13.0–17.0)
MCH: 36 pg — AB (ref 26.0–34.0)
MCHC: 36.5 g/dL — ABNORMAL HIGH (ref 30.0–36.0)
MCV: 98.5 fL (ref 78.0–100.0)
PLATELETS: 89 10*3/uL — AB (ref 150–400)
RBC: 3.92 MIL/uL — AB (ref 4.22–5.81)
RDW: 12.8 % (ref 11.5–15.5)
WBC: 7.2 10*3/uL (ref 4.0–10.5)

## 2014-01-14 LAB — ACETAMINOPHEN LEVEL: Acetaminophen (Tylenol), Serum: <15 ug/mL (ref 10–30)

## 2014-01-14 LAB — SALICYLATE LEVEL

## 2014-01-14 LAB — ETHANOL: Alcohol, Ethyl (B): 278 mg/dL — ABNORMAL HIGH (ref 0–11)

## 2014-01-14 NOTE — ED Notes (Signed)
Pt requesting detox from [facility], last drink was today 4 40oz beers tonight.  Pt is calm and cooperative at this time.  Pt reports last rehab was 5-6 months ago.

## 2014-01-15 ENCOUNTER — Encounter (HOSPITAL_COMMUNITY): Payer: Self-pay | Admitting: Emergency Medicine

## 2014-01-15 LAB — CBG MONITORING, ED
GLUCOSE-CAPILLARY: 197 mg/dL — AB (ref 70–99)
Glucose-Capillary: 316 mg/dL — ABNORMAL HIGH (ref 70–99)
Glucose-Capillary: 280 mg/dL — ABNORMAL HIGH (ref 70–99)

## 2014-01-15 LAB — ETHANOL

## 2014-01-15 MED ORDER — LORAZEPAM 1 MG PO TABS
1.0000 mg | ORAL_TABLET | ORAL | Status: DC | PRN
  Administered 2014-01-15 (×2): 1 mg via ORAL
  Filled 2014-01-15 (×2): qty 1

## 2014-01-15 MED ORDER — METFORMIN HCL 500 MG PO TABS
1000.0000 mg | ORAL_TABLET | Freq: Two times a day (BID) | ORAL | Status: DC
  Administered 2014-01-15: 1000 mg via ORAL
  Filled 2014-01-15 (×2): qty 2

## 2014-01-15 MED ORDER — METFORMIN HCL 500 MG PO TABS
1000.0000 mg | ORAL_TABLET | Freq: Once | ORAL | Status: AC
  Administered 2014-01-15: 1000 mg via ORAL
  Filled 2014-01-15: qty 2

## 2014-01-15 MED ORDER — IBUPROFEN 200 MG PO TABS: 600.0000 mg | ORAL_TABLET | Freq: Three times a day (TID) | ORAL | Status: DC | PRN

## 2014-01-15 MED ORDER — LORAZEPAM 1 MG PO TABS: 1.0000 mg | ORAL_TABLET | Freq: Four times a day (QID) | ORAL | Status: DC | PRN

## 2014-01-15 MED ORDER — METFORMIN HCL 1000 MG PO TABS
1000.0000 mg | ORAL_TABLET | Freq: Two times a day (BID) | ORAL | Status: DC
Start: 2014-01-15 — End: 2014-11-23

## 2014-01-15 MED ORDER — ONDANSETRON HCL 4 MG PO TABS: 4.0000 mg | ORAL_TABLET | Freq: Three times a day (TID) | ORAL | Status: DC | PRN

## 2014-01-15 NOTE — BHH Counselor (Signed)
Pt is being d/c home. Probation officer gave him following resources:  [facility] [place] [address]     [place] [address] [place],  [place], [place]      [phone] Both a day and evening program   *Accepts MCD  [facility] Substance abuse treatment ctr [address]     [address] [place], [place] [address] [phone]      [phone]  [facility]: [facility]    [facility] - Intensive Outpatient [place] [address] [place], [place] [address]     [place], [place]      [place] [address], [place]. [place], [place] [address] [phone] *Accepts MCD      Residential Treatment Programs ASAP Residential Treatment    [facility] [address]     [address] [place], [place]      [phone] or [place]     The [address] (Several in [place]) [place] [address]    [place] [address]     [place], [place]      [phone]  [place]   [facility] ([facility]) [place]     [address] [place], [place] [address]     [place], [place]      [phone] Admissions: 8am-3pm M-F  Self-Help/Support Groups [facility] of    Narcotics Anonymous (NA) Variety of support groups    [facility] [phone] (call for more info)    [place] - 2 meetings at this location These referrals have been provided to you as appropriate for your clinical needs while taking into account your financial concerns. Please be aware that agencies, practitioners and insurance companies sometimes change  contracts. When calling to make an appointment have your insurance information available so the professional you are going to see can confirm whether they are covered by your plan. Take this form with you in case the person you are seeing needs a copy or to contact us.  __________________________________________ Assessment Counselor  Latricia RN provided bus pass to pt.  Arnold Long, [place] Assessment Counselor

## 2014-01-15 NOTE — ED Notes (Signed)
Pt accepted to [facility], pending [facility] transport.

## 2014-01-15 NOTE — Consult Note (Signed)
Face to face evaluation and I agree with this note

## 2014-01-15 NOTE — ED Provider Notes (Signed)
CSN: [id number]     Arrival date & time 01/14/14  2231 History   First MD Initiated Contact with Patient 01/15/14 0234     Chief Complaint  Patient presents with  . Drug / Alcohol Assessment     (Consider location/radiation/quality/duration/timing/severity/associated sxs/prior Treatment) Patient is a 58 y.o. male presenting with drug/alcohol assessment. The history is provided by the patient.  Drug / Alcohol Assessment Similar prior episodes: yes   Severity:  Moderate Onset quality:  Gradual Timing:  Constant Progression:  Unchanged Chronicity:  Chronic Suspected agents:  Alcohol Associated symptoms: no abdominal pain   Risk factors: no recent infection     Past Medical History  Diagnosis Date  . Hepatitis C   . Diabetes mellitus   . Chronic hyponatremia     pt denies having   . Alcoholic   . Alcoholic cirrhosis of liver     Pt calls this a false diagnosis. Pt denies  . Thrombocytopenia   . Hyperglycemia   . Depression   . Anxiety    Past Surgical History  Procedure Laterality Date  . Plate in lower right leg  2008  . Fracture surgery  right lower leg plate placed years ago   Family History  Problem Relation Age of Onset  . ALS Father    History  Substance Use Topics  . Smoking status: Current Every Day Smoker -- 1.00 packs/day for 40 years    Types: Cigarettes  . Smokeless tobacco: Never Used  . Alcohol Use: 9.0 oz/week    15 Cans of beer per week     Comment: 18/daily    Review of Systems  Gastrointestinal: Negative for abdominal pain.  All other systems reviewed and are negative.     Allergies  Benadryl; Librium; and Sulfa antibiotics  Home Medications   Prior to Admission medications   Medication Sig Start Date End Date Taking? Authorizing Provider  LORazepam (ATIVAN) 1 MG tablet Take 1 mg by mouth every 8 (eight) hours as needed for anxiety.   Yes Historical Provider, MD  metFORMIN (GLUCOPHAGE) 1000 MG tablet Take 1,000 mg by mouth 2 (two)  times daily.   Yes Historical Provider, MD   BP 147/84  Pulse 91  Temp(Src) 97.7 F (36.5 C) (Oral)  Resp 20  SpO2 97% Physical Exam  Constitutional: He appears well-developed and well-nourished. No distress.  HENT:  Head: Normocephalic and atraumatic.  Mouth/Throat: Oropharynx is clear and moist.  Eyes: Conjunctivae and EOM are normal.  Neck: Normal range of motion. Neck supple.  Cardiovascular: Normal rate, regular rhythm and intact distal pulses.   Pulmonary/Chest: Effort normal and breath sounds normal. He has no wheezes. He has no rales.  Abdominal: Soft. Bowel sounds are normal. There is no tenderness. There is no rebound and no guarding.  Musculoskeletal: Normal range of motion. He exhibits no edema.  Neurological: He is alert. He has normal reflexes.  Skin: Skin is warm and dry.  Psychiatric: He has a normal mood and affect.    ED Course  Procedures (including critical care time) Labs Review Labs Reviewed  CBC - Abnormal; Notable for the following:    RBC 3.92 (*)    HCT 38.6 (*)    MCH 36.0 (*)    MCHC 36.5 (*)    Platelets 89 (*)    All other components within normal limits  COMPREHENSIVE METABOLIC PANEL - Abnormal; Notable for the following:    Sodium 125 (*)    Chloride 82 (*)  Glucose, Bld 207 (*)    Total Protein 8.5 (*)    AST 107 (*)    ALT 93 (*)    Alkaline Phosphatase 121 (*)    Anion gap 16 (*)    All other components within normal limits  ETHANOL - Abnormal; Notable for the following:    Alcohol, Ethyl (B) 278 (*)    All other components within normal limits  SALICYLATE LEVEL - Abnormal; Notable for the following:    Salicylate Lvl <4.4 (*)    All other components within normal limits  ACETAMINOPHEN LEVEL  URINE RAPID DRUG SCREEN (HOSP PERFORMED)    Imaging Review No results found.   EKG Interpretation None      MDM   Final diagnoses:  None    Wants DETOX to be seen by TTS    Harald Quevedo K Jashiya Bassett-Rasch, MD 01/15/14 0347

## 2014-01-15 NOTE — [facility] Counselor (Signed)
[facility] beds. Writer sent referral to [facility]. Oncoming [facility] will need to fax updated BAL and CBG when available.  Arnold Long, [place] Assessment Counselor

## 2014-01-15 NOTE — BH Assessment (Addendum)
Assessment Note  [name] is an 58 y.o. male. Pt presents voluntarily to [facility] with request for alcohol detox. Pt sts he drinks approx four 40 beers daily and drank four 40 oz beers last night. Pt reports withdrawal sxs of tremors and anxiety. He says, "It has only been 12 hours" and he reports that soon he will begin feeling nauseous. Pt denies hx of seizures and denies hx of DTs. He denies any Armed forces logistics/support/administrative officer. Per chart review, pt's most recent admission was June 2015 at [place] for alcohol detox. Pt has been to [facility] 9 times from 2012 to 2015. He says he has also been admitted to [facility] in previous years. Pt is cooperative and oriented x 4. He reports he is "embarassed" for continuing to present for alcohol detox. Pt denies SI and HI. He denies [facility] and no delusions noted. Pt has no hx of outpatient MH treatment. Pt says he lives w/ his brother in law and is divorced. Pt sts he has a court date 02/05/14 for failing to check in w/ probation officer while he was in [facility]. Pt endorses loss of interest in usual pleasures. Pt reports poor appetite but states this lack of appetite is a result of his dental issues.   Axis I: Alcohol Use Disorder, Severe Axis II: Deferred Axis III:  Past Medical History  Diagnosis Date  . Hepatitis C   . Diabetes mellitus   . Chronic hyponatremia     pt denies having   . Alcoholic   . Alcoholic cirrhosis of liver     Pt calls this a false diagnosis. Pt denies  . Thrombocytopenia   . Hyperglycemia   . Depression   . Anxiety    Axis IV: economic problems, other psychosocial or environmental problems, problems related to social environment and problems with primary support group Axis V: 31-40 impairment in reality testing  Past Medical History:  Past Medical History  Diagnosis Date  . Hepatitis C   . Diabetes mellitus   . Chronic hyponatremia     pt denies having   . Alcoholic   . Alcoholic cirrhosis of liver     Pt calls this a false  diagnosis. Pt denies  . Thrombocytopenia   . Hyperglycemia   . Depression   . Anxiety     Past Surgical History  Procedure Laterality Date  . Plate in lower right leg  2008  . Fracture surgery  right lower leg plate placed years ago    Family History:  Family History  Problem Relation Age of Onset  . ALS Father     Social History:  reports that he has been smoking Cigarettes.  He has a 40 pack-year smoking history. He has never used smokeless tobacco. He reports that he drinks about 9 ounces of alcohol per week. He reports that he does not use illicit drugs.  Additional Social History:  Alcohol / Drug Use Pain Medications: pt denies abuse Prescriptions: pt denies abuse Over the Counter: pt denies abuse History of alcohol / drug use?: Yes Negative Consequences of Use: Financial;Personal relationships;Work / Government social research officer Symptoms: Patient aware of relationship between substance abuse and physical/medical complications;Tremors;Agitation Substance #1 Name of Substance 1: etoh 1 - Age of First Use: 14 1 - Amount (size/oz): four 40 oz beers 1 - Frequency: daily 1 - Duration: for years 1 - Last Use / Amount: 01/14/14 - four 40 oz beers  CIWA: CIWA-Ar BP: 132/81 mmHg Pulse Rate: 98  COWS:    Allergies:  Allergies  Allergen Reactions  . Benadryl [Diphenhydramine Hcl]     "Causes nervousness"  . Librium [Chlordiazepoxide Hcl] Anxiety  . Sulfa Antibiotics Rash    Home Medications:  (Not in a hospital admission)  OB/GYN Status:  No LMP for male patient.  General Assessment Data Location of Assessment: [facility] ED Is this a Tele or Face-to-Face Assessment?: Face-to-Face Is this an Initial Assessment or a Re-assessment for this encounter?: Initial Assessment Living Arrangements: Other (Comment);Other relatives (brother in law) Can pt return to current living arrangement?: Yes Admission Status: Voluntary Is patient capable of signing voluntary admission?: Yes Transfer  from: [place] Arrangements: Other (Comment);Other relatives (brother in law) Name of Psychiatrist: none Name of Therapist: none  Education Status Is patient currently in school?: No Highest grade of school patient has completed: 21 Name of school: [employer]  Risk to self with the past 6 months Suicidal Ideation: No Suicidal Intent: No Is patient at risk for suicide?: No Suicidal Plan?: No Access to Means: No What has been your use of drugs/alcohol within the last 12 months?: daily alcohol use Previous Attempts/Gestures: No How many times?: 0 Other Self Harm Risks: none Triggers for Past Attempts:  (n/a) Intentional Self Injurious Behavior: None Family Suicide History: No Recent stressful life event(s): Other (Comment);Financial Problems (alcohol dependence) Persecutory voices/beliefs?: No Depression: Yes Depression Symptoms: Loss of interest in usual pleasures Substance abuse history and/or treatment for substance abuse?: Yes Suicide prevention information given to non-admitted patients: Not applicable  Risk to Others within the past 6 months Homicidal Ideation: No Thoughts of Harm to Others: No Current Homicidal Intent: No Current Homicidal Plan: No Access to Homicidal Means: No Identified Victim: none History of harm to others?: No Assessment of Violence: None Noted Violent Behavior Description: pt calm and cooperative Does patient have access to weapons?: No Criminal Charges Pending?: Yes Describe Pending Criminal Charges: failing to check in w/ probation officer while in hospital Does patient have a court date: Yes Court Date: 02/05/14  Psychosis Hallucinations: None noted Delusions: None noted  Mental Status Report Appear/Hygiene: Disheveled Eye Contact: Good Motor Activity: Freedom of movement Speech: Logical/coherent Level of Consciousness: Alert Mood: Irritable Affect: Appropriate to circumstance Anxiety Level:  Minimal Thought Processes: Coherent;Relevant Judgement: Unimpaired Orientation: Person;Place;Time;Situation Obsessive Compulsive Thoughts/Behaviors: None  Cognitive Functioning Concentration: Normal Memory: Recent Intact;Remote Intact IQ: Average Insight: Fair Impulse Control: Fair Appetite: Poor Weight Loss:  (pt sts doesn't eat d/t dental problems) Sleep: No Change Total Hours of Sleep: 4 Vegetative Symptoms: None  ADLScreening [facility] Assessment Services) Patient's cognitive ability adequate to safely complete daily activities?: Yes Patient able to express need for assistance with ADLs?: Yes Independently performs ADLs?: Yes (appropriate for developmental age)  Prior Inpatient Therapy Prior Inpatient Therapy: Yes Prior Therapy Dates: 2009-2015 Prior Therapy Facilty/Provider(s): [facility] (x9) [facility], [place] Reason for Treatment: alcohol detox and treatment  Prior Outpatient Therapy Prior Outpatient Therapy: No Prior Therapy Dates: na Prior Therapy Facilty/Provider(s): na Reason for Treatment: na  ADL Screening (condition at time of admission) Patient's cognitive ability adequate to safely complete daily activities?: Yes Is the patient deaf or have difficulty hearing?: No Does the patient have difficulty seeing, even when wearing glasses/contacts?: No Does the patient have difficulty concentrating, remembering, or making decisions?: No Patient able to express need for assistance with ADLs?: Yes Does the patient have difficulty dressing or bathing?: No Independently performs ADLs?: Yes (appropriate for developmental  age) Does the patient have difficulty walking or climbing stairs?: No Weakness of Legs: None Weakness of Arms/Hands: None  Home Assistive Devices/Equipment Home Assistive Devices/Equipment: None    Abuse/Neglect Assessment (Assessment to be complete while patient is alone) Physical Abuse: Denies Verbal Abuse: Denies Sexual Abuse: Denies Exploitation of  patient/patient's resources: Denies Self-Neglect: Denies Values / Beliefs Cultural Requests During Hospitalization: None Spiritual Requests During Hospitalization: None   Advance Directives (For Healthcare) Does patient have an advance directive?: No Would patient like information on creating an advanced directive?: No - patient declined information    Additional Information 1:1 In Past 12 Months?: No CIRT Risk: No Elopement Risk: No Does patient have medical clearance?: Yes     Disposition:  Dr Lovena Le and Earleen Newport NP accept pt to the [facility] OBS unit.   Disposition Initial Assessment Completed for this Encounter: Yes Disposition of Patient: Inpatient treatment program Type of inpatient treatment program: Adult (detox)  On Site Evaluation by:   Reviewed with Physician:    Leron Croak P 01/15/2014 9:03 AM`

## 2014-01-15 NOTE — Discharge Instructions (Signed)
Alcohol Withdrawal Anytime drug use is interfering with normal living activities it has become abuse. This includes problems with family and friends. Psychological dependence has developed when your mind tells you that the drug is needed. This is usually followed by physical dependence when a continuing increase of drugs are required to get the same feeling or "high." This is known as addiction or chemical dependency. A person's risk is much higher if there is a history of chemical dependency in the family. Mild Withdrawal Following Stopping Alcohol, When Addiction or Chemical Dependency Has Developed When a person has developed tolerance to alcohol, any sudden stopping of alcohol can cause uncomfortable physical symptoms. Most of the time these are mild and consist of tremors in the hands and increases in heart rate, breathing, and temperature. Sometimes these symptoms are associated with anxiety, panic attacks, and bad dreams. There may also be stomach upset. Normal sleep patterns are often interrupted with periods of inability to sleep (insomnia). This may last for 6 months. Because of this discomfort, many people choose to continue drinking to get rid of this discomfort and to try to feel normal. Severe Withdrawal with Decreased or No Alcohol Intake, When Addiction or Chemical Dependency Has Developed About five percent of alcoholics will develop signs of severe withdrawal when they stop using alcohol. One sign of this is development of generalized seizures (convulsions). Other signs of this are severe agitation and confusion. This may be associated with believing in things which are not real or seeing things which are not really there (delusions and hallucinations). Vitamin deficiencies are usually present if alcohol intake has been long-term. Treatment for this most often requires hospitalization and close observation. Addiction can only be helped by stopping use of all chemicals. This is hard but may  save your life. With continual alcohol use, possible outcomes are usually loss of self respect and esteem, violence, and death. Addiction cannot be cured but it can be stopped. This often requires outside help and the care of professionals. Treatment centers are listed in the yellow pages under Cocaine, Narcotics, and Alcoholics Anonymous. Most hospitals and clinics can refer you to a specialized care center. It is not necessary for you to go through the uncomfortable symptoms of withdrawal. Your caregiver can provide you with medicines that will help you through this difficult period. Try to avoid situations, friends, or drugs that made it possible for you to keep using alcohol in the past. Learn how to say no. It takes a long period of time to overcome addictions to all drugs, including alcohol. There may be many times when you feel as though you want a drink. After getting rid of the physical addiction and withdrawal, you will have a lessening of the craving which tells you that you need alcohol to feel normal. Call your caregiver if more support is needed. Learn who to talk to in your family and among your friends so that during these periods you can receive outside help. Alcoholics Anonymous (AA) has helped many people over the years. To get further help, contact AA or call your caregiver, counselor, or clergyperson. Al-Anon and Alateen are support groups for friends and family members of an alcoholic. The people who love and care for an alcoholic often need help, too. For information about these organizations, check your phone directory or call a local alcoholism treatment center.  SEEK IMMEDIATE MEDICAL CARE IF:   You have a seizure.  You have a fever.  You experience uncontrolled vomiting or you  vomit up blood. This may be bright red or look like black coffee grounds.  You have blood in the stool. This may be bright red or appear as a black, tarry, bad-smelling stool.  You become lightheaded or  faint. Do not drive if you feel this way. Have someone else drive you or call 102 for help.  You become more agitated or confused.  You develop uncontrolled anxiety.  You begin to see things that are not really there (hallucinate). Your caregiver has determined that you completely understand your medical condition, and that your mental state is back to normal. You understand that you have been treated for alcohol withdrawal, have agreed not to drink any alcohol for a minimum of 1 day, will not operate a car or other machinery for 24 hours, and have had an opportunity to ask any questions about your condition. Document Released: 02/03/2005 Document Revised: 07/19/2011 Document Reviewed: 12/13/2007 [facility] Patient Information 2015 [place], [place]. This information is not intended to replace advice given to you by your health care provider. Make sure you discuss any questions you have with your health care provider.  Alcohol Use Disorder Alcohol use disorder is a mental disorder. It is not a one-time incident of heavy drinking. Alcohol use disorder is the excessive and uncontrollable use of alcohol over time that leads to problems with functioning in one or more areas of daily living. People with this disorder risk harming themselves and others when they drink to excess. Alcohol use disorder also can cause other mental disorders, such as mood and anxiety disorders, and serious physical problems. People with alcohol use disorder often misuse other drugs.  Alcohol use disorder is common and widespread. Some people with this disorder drink alcohol to cope with or escape from negative life events. Others drink to relieve chronic pain or symptoms of mental illness. People with a family history of alcohol use disorder are at higher risk of losing control and using alcohol to excess.  SYMPTOMS  Signs and symptoms of alcohol use disorder may include the following:   Consumption ofalcohol inlarger amounts or  over a longer period of time than intended.  Multiple unsuccessful attempts to cutdown or control alcohol use.   A great deal of time spent obtaining alcohol, using alcohol, or recovering from the effects of alcohol (hangover).  A strong desire or urge to use alcohol (cravings).   Continued use of alcohol despite problems at work, school, or home because of alcohol use.   Continued use of alcohol despite problems in relationships because of alcohol use.  Continued use of alcohol in situations when it is physically hazardous, such as driving a car.  Continued use of alcohol despite awareness of a physical or psychological problem that is likely related to alcohol use. Physical problems related to alcohol use can involve the brain, heart, liver, stomach, and intestines. Psychological problems related to alcohol use include intoxication, depression, anxiety, psychosis, delirium, and dementia.   The need for increased amounts of alcohol to achieve the same desired effect, or a decreased effect from the consumption of the same amount of alcohol (tolerance).  Withdrawal symptoms upon reducing or stopping alcohol use, or alcohol use to reduce or avoid withdrawal symptoms. Withdrawal symptoms include:  Racing heart.  Hand tremor.  Difficulty sleeping.  Nausea.  Vomiting.  Hallucinations.  Restlessness.  Seizures. DIAGNOSIS Alcohol use disorder is diagnosed through an assessment by your health care provider. Your health care provider may start by asking three or four questions  to screen for excessive or problematic alcohol use. To confirm a diagnosis of alcohol use disorder, at least two symptoms must be present within a 65-monthperiod. The severity of alcohol use disorder depends on the number of symptoms:  Mild--two or three.  Moderate--four or five.  Severe--six or more. Your health care provider may perform a physical exam or use results from lab tests to see if you have  physical problems resulting from alcohol use. Your health care provider may refer you to a mental health professional for evaluation. TREATMENT  Some people with alcohol use disorder are able to reduce their alcohol use to low-risk levels. Some people with alcohol use disorder need to quit drinking alcohol. When necessary, mental health professionals with specialized training in substance use treatment can help. Your health care provider can help you decide how severe your alcohol use disorder is and what type of treatment you need. The following forms of treatment are available:   Detoxification. Detoxification involves the use of prescription medicines to prevent alcohol withdrawal symptoms in the first week after quitting. This is important for people with a history of symptoms of withdrawal and for heavy drinkers who are likely to have withdrawal symptoms. Alcohol withdrawal can be dangerous and, in severe cases, cause death. Detoxification is usually provided in a hospital or in-patient substance use treatment facility.  Counseling or talk therapy. Talk therapy is provided by substance use treatment counselors. It addresses the reasons people use alcohol and ways to keep them from drinking again. The goals of talk therapy are to help people with alcohol use disorder find healthy activities and ways to cope with life stress, to identify and avoid triggers for alcohol use, and to handle cravings, which can cause relapse.  Medicines.Different medicines can help treat alcohol use disorder through the following actions:  Decrease alcohol cravings.  Decrease the positive reward response felt from alcohol use.  Produce an uncomfortable physical reaction when alcohol is used (aversion therapy).  Support groups. Support groups are run by people who have quit drinking. They provide emotional support, advice, and guidance. These forms of treatment are often combined. Some people with alcohol use disorder  benefit from intensive combination treatment provided by specialized substance use treatment centers. Both inpatient and outpatient treatment programs are available. Document Released: 06/03/2004 Document Revised: 09/10/2013 Document Reviewed: 08/03/2012 [facility] Information 2015 [place] This information is not intended to replace advice given to you by your health care provider. Make sure you discuss any questions you have with your health care provider.  Alcohol Use Disorder Alcohol use disorder is a mental disorder. It is not a one-time incident of heavy drinking. Alcohol use disorder is the excessive and uncontrollable use of alcohol over time that leads to problems with functioning in one or more areas of daily living. People with this disorder risk harming themselves and others when they drink to excess. Alcohol use disorder also can cause other mental disorders, such as mood and anxiety disorders, and serious physical problems. People with alcohol use disorder often misuse other drugs.  Alcohol use disorder is common and widespread. Some people with this disorder drink alcohol to cope with or escape from negative life events. Others drink to relieve chronic pain or symptoms of mental illness. People with a family history of alcohol use disorder are at higher risk of losing control and using alcohol to excess.  SYMPTOMS  Signs and symptoms of alcohol use disorder may include the following:   Consumption ofalcohol  inlarger amounts or over a longer period of time than intended.  Multiple unsuccessful attempts to cutdown or control alcohol use.   A great deal of time spent obtaining alcohol, using alcohol, or recovering from the effects of alcohol (hangover).  A strong desire or urge to use alcohol (cravings).   Continued use of alcohol despite problems at work, school, or home because of alcohol use.   Continued use of alcohol despite problems in relationships because  of alcohol use.  Continued use of alcohol in situations when it is physically hazardous, such as driving a car.  Continued use of alcohol despite awareness of a physical or psychological problem that is likely related to alcohol use. Physical problems related to alcohol use can involve the brain, heart, liver, stomach, and intestines. Psychological problems related to alcohol use include intoxication, depression, anxiety, psychosis, delirium, and dementia.   The need for increased amounts of alcohol to achieve the same desired effect, or a decreased effect from the consumption of the same amount of alcohol (tolerance).  Withdrawal symptoms upon reducing or stopping alcohol use, or alcohol use to reduce or avoid withdrawal symptoms. Withdrawal symptoms include:  Racing heart.  Hand tremor.  Difficulty sleeping.  Nausea.  Vomiting.  Hallucinations.  Restlessness.  Seizures. DIAGNOSIS Alcohol use disorder is diagnosed through an assessment by your health care provider. Your health care provider may start by asking three or four questions to screen for excessive or problematic alcohol use. To confirm a diagnosis of alcohol use disorder, at least two symptoms must be present within a 77-monthperiod. The severity of alcohol use disorder depends on the number of symptoms:  Mild--two or three.  Moderate--four or five.  Severe--six or more. Your health care provider may perform a physical exam or use results from lab tests to see if you have physical problems resulting from alcohol use. Your health care provider may refer you to a mental health professional for evaluation. TREATMENT  Some people with alcohol use disorder are able to reduce their alcohol use to low-risk levels. Some people with alcohol use disorder need to quit drinking alcohol. When necessary, mental health professionals with specialized training in substance use treatment can help. Your health care provider can help you  decide how severe your alcohol use disorder is and what type of treatment you need. The following forms of treatment are available:   Detoxification. Detoxification involves the use of prescription medicines to prevent alcohol withdrawal symptoms in the first week after quitting. This is important for people with a history of symptoms of withdrawal and for heavy drinkers who are likely to have withdrawal symptoms. Alcohol withdrawal can be dangerous and, in severe cases, cause death. Detoxification is usually provided in a hospital or in-patient substance use treatment facility.  Counseling or talk therapy. Talk therapy is provided by substance use treatment counselors. It addresses the reasons people use alcohol and ways to keep them from drinking again. The goals of talk therapy are to help people with alcohol use disorder find healthy activities and ways to cope with life stress, to identify and avoid triggers for alcohol use, and to handle cravings, which can cause relapse.  Medicines.Different medicines can help treat alcohol use disorder through the following actions:  Decrease alcohol cravings.  Decrease the positive reward response felt from alcohol use.  Produce an uncomfortable physical reaction when alcohol is used (aversion therapy).  Support groups. Support groups are run by people who have quit drinking. They provide emotional support,  advice, and guidance. These forms of treatment are often combined. Some people with alcohol use disorder benefit from intensive combination treatment provided by specialized substance use treatment centers. Both inpatient and outpatient treatment programs are available. Document Released: 06/03/2004 Document Revised: 09/10/2013 Document Reviewed: 08/03/2012 [facility] Patient Information 2015 [place], [place]. This information is not intended to replace advice given to you by your health care provider. Make sure you discuss any questions you have with your  health care provider.

## 2014-01-15 NOTE — ED Notes (Signed)
Pt given a [place] sandwich, sprite, and a warm blanket

## 2014-01-15 NOTE — Progress Notes (Addendum)
  CARE MANAGEMENT ED NOTE 01/15/2014  Patient:  [name], [name]   Account Number:  [id number]  Date Initiated:  01/15/2014  Documentation initiated by:  Jackelyn Poling  Subjective/Objective Assessment:   58 yr old self pay [facility] pt Requesting detox BAL 278 UDS negative     Subjective/Objective Assessment Detail:   no pcp per pt  with 6 HS ED visits and 2 admission in last 6 months  Pt states he waited at [facility] for orange card, "was first in line" and was told after they opened they were not signing people up  Pt refused to a referral to [facility] to offer another location to obtain [facility] card States he is "in the process of moving"     Action/Plan:   ED CM spoke with pt and provided [employer] self pay resources to include [facility] information   Action/Plan Detail:   Anticipated DC Date:       Status Recommendation to Physician:   Result of Recommendation:    Other ED [place]  Other  Outpatient Services - Pt will follow up  PCP issues    Choice offered to / List presented to:            Status of service:  Completed, signed off  ED Comments:   ED Comments Detail:  CM spoke with pt who confirms self pay [employer] resident with no pcp. CM discussed and provided written information for self pay pcps, importance of pcp for f/u care, www.needymeds.org, discounted pharmacies and other [employer] such as [employer], affordable care act,  financial assistance, DSS and  health department Reviewed resources for [employer] self pay pcps like [facility], family medicine at [address], [facility] family practice, general medical clinics, [facility] urgent care plus others, medication resources, [facility] out patient pharmacies and housing Pt voiced understanding and appreciation of resources provided  Provided [facility] contact information

## 2014-01-15 NOTE — ED Notes (Signed)
Pt presents for alcohol detox, pt states he drinks 160 oz's of beer per day.  Denies SI or HI, no AV hallucinations.  Pt is known diabetic, CBG in main ed [facility].  Pt reports he feels anxious about his bills and court dates.  Pt cooperative but anxious at present.  ACT team Paige speaking with pt at present.

## 2014-01-15 NOTE — ED Notes (Signed)
Charge spoke with [name], pt still needs TTS assessment.

## 2014-01-15 NOTE — ED Notes (Signed)
[facility] at bedside to transport pt to [facility].

## 2014-01-15 NOTE — Consult Note (Signed)
[place] [facility] Consult   Reason for Consult:  Alcohol dependence Referring Physician:  EDP  [name] is an 58 y.o. male. Total Time spent with patient: 30 minutes  Assessment: AXIS I:  Alcohol Abuse and Substance Induced Mood Disorder AXIS II:  Deferred AXIS III:   Past Medical History  Diagnosis Date  . Hepatitis C   . Diabetes mellitus   . Chronic hyponatremia     pt denies having   . Alcoholic   . Alcoholic cirrhosis of liver     Pt calls this a false diagnosis. Pt denies  . Thrombocytopenia   . Hyperglycemia   . Depression   . Anxiety    AXIS IV:  other psychosocial or environmental problems AXIS V:  61-70 mild symptoms  Plan:  No evidence of imminent risk to self or others at present.   Patient does not meet criteria for psychiatric inpatient admission. Supportive therapy provided about ongoing stressors. Discussed crisis plan, support from social network, calling 911, coming to the Emergency Department, and calling Suicide Hotline.  Subjective:   [name] is a 58 y.o. male patient.  HPI:  Patient is requesting alcohol detox.  Patient states that he drinks daily and his last drink was 15-16 hours ago.  Patient is requesting ativan prescription "It helps relax me and helps with the cramps."  Patient denies a history seizures.  Patient denies suicidal/homicidal ideation, psychosis, and paranoia.  Discussed outpatient resources with patient.  Patient states that he does not go to [place] "because it makes me want to drink more."  Patient refuses outpatient services.   Patient states "If you could just give Ativan to go home with that is the only thing that will help.  I have quit before with out anything; I can just do it again."  Patient detox [facility] in June, 2015.  Patient did not follow up with any referrals and patient states that he remained sober for 1 week after discharge.  Patient would benefit from inpatient detox.  If unable to find  inpatient bed patient will need to follow up with outpatient resources that will assist with substance/alcohol abuse.    HPI Elements:   Location:  alcohol dependence. Quality:  alcohol abuse. Severity:  alcohol intoxication/substance induce mood disorder. Timing:  3 months.. Review of Systems  Constitutional: Negative for diaphoresis.  HENT: Negative.   Respiratory: Negative.   Gastrointestinal: Positive for abdominal pain.  Musculoskeletal: Negative.   Neurological: Negative for tremors, seizures, loss of consciousness and weakness.  Psychiatric/Behavioral: Positive for substance abuse. Negative for depression, suicidal ideas, hallucinations and memory loss. The patient is not nervous/anxious and does not have insomnia.    Family History  Problem Relation Age of Onset  . ALS Father      Past Psychiatric History: Past Medical History  Diagnosis Date  . Hepatitis C   . Diabetes mellitus   . Chronic hyponatremia     pt denies having   . Alcoholic   . Alcoholic cirrhosis of liver     Pt calls this a false diagnosis. Pt denies  . Thrombocytopenia   . Hyperglycemia   . Depression   . Anxiety     reports that he has been smoking Cigarettes.  He has a 40 pack-year smoking history. He has never used smokeless tobacco. He reports that he drinks about 9 ounces of alcohol per week. He reports that he does not use illicit drugs. Family History  Problem Relation Age  of Onset  . ALS Father    Family History Substance Abuse: No Family Supports: Yes, List: (a few friends) Living Arrangements: Other (Comment);Other relatives (brother in law) Can pt return to current living arrangement?: Yes Abuse/Neglect [name]) Physical Abuse: Denies Verbal Abuse: Denies Sexual Abuse: Denies Allergies:   Allergies  Allergen Reactions  . Benadryl [Diphenhydramine Hcl]     "Causes nervousness"  . Librium [Chlordiazepoxide Hcl] Anxiety  . Sulfa Antibiotics Rash    ACT Assessment Complete:  Yes:     Educational Status    Risk to Self: Risk to self with the past 6 months Suicidal Ideation: No Suicidal Intent: No Is patient at risk for suicide?: No Suicidal Plan?: No Access to Means: No What has been your use of drugs/alcohol within the last 12 months?: daily alcohol use Previous Attempts/Gestures: No How many times?: 0 Other Self Harm Risks: none Triggers for Past Attempts:  (n/a) Intentional Self Injurious Behavior: None Family Suicide History: No Recent stressful life event(s): Other (Comment);Financial Problems (alcohol dependence) Persecutory voices/beliefs?: No Depression: Yes Depression Symptoms: Loss of interest in usual pleasures Substance abuse history and/or treatment for substance abuse?: Yes Suicide prevention information given to non-admitted patients: Not applicable  Risk to Others: Risk to Others within the past 6 months Homicidal Ideation: No Thoughts of Harm to Others: No Current Homicidal Intent: No Current Homicidal Plan: No Access to Homicidal Means: No Identified Victim: none History of harm to others?: No Assessment of Violence: None Noted Violent Behavior Description: pt calm and cooperative Does patient have access to weapons?: No Criminal Charges Pending?: Yes Describe Pending Criminal Charges: failing to check in w/ probation officer while in hospital Does patient have a court date: Yes Court Date: 02/05/14  Abuse: Abuse/Neglect Assessment (Assessment to be complete while patient is alone) Physical Abuse: Denies Verbal Abuse: Denies Sexual Abuse: Denies Exploitation of patient/patient's resources: Denies Self-Neglect: Denies  Prior Inpatient Therapy: Prior Inpatient Therapy Prior Inpatient Therapy: Yes Prior Therapy Dates: 2009-2015 Prior Therapy Facilty/Provider(s): [facility] (x9) [facility], [place] Reason for Treatment: alcohol detox and treatment  Prior Outpatient Therapy: Prior Outpatient Therapy Prior Outpatient Therapy: No Prior Therapy  Dates: na Prior Therapy Facilty/Provider(s): na Reason for Treatment: na  Additional Information: Additional Information 1:1 In Past 12 Months?: No CIRT Risk: No Elopement Risk: No Does patient have medical clearance?: Yes     Objective: Blood pressure 156/86, pulse 104, temperature 97.9 F (36.6 C), temperature source Oral, resp. rate 12, SpO2 93.00%.There is no weight on file to calculate BMI. Results for orders placed during the hospital encounter of 01/14/14 (from the past 72 hour(s))  URINE RAPID DRUG SCREEN (HOSP PERFORMED)     Status: None   Collection Time    01/14/14 10:41 PM      Result Value Ref Range   Opiates NONE DETECTED  NONE DETECTED   Cocaine NONE DETECTED  NONE DETECTED   Benzodiazepines NONE DETECTED  NONE DETECTED   Amphetamines NONE DETECTED  NONE DETECTED   Tetrahydrocannabinol NONE DETECTED  NONE DETECTED   Barbiturates NONE DETECTED  NONE DETECTED   Comment:            DRUG SCREEN FOR MEDICAL PURPOSES     ONLY.  IF CONFIRMATION IS NEEDED     FOR ANY PURPOSE, NOTIFY LAB     WITHIN 5 DAYS.                LOWEST DETECTABLE LIMITS     FOR URINE DRUG SCREEN  Drug Class       Cutoff (ng/mL)     Amphetamine      1000     Barbiturate      200     Benzodiazepine   542     Tricyclics       706     Opiates          300     Cocaine          300     THC              50  ACETAMINOPHEN LEVEL     Status: None   Collection Time    01/14/14 10:42 PM      Result Value Ref Range   Acetaminophen (Tylenol), Serum <15.0  10 - 30 ug/mL   Comment:            THERAPEUTIC CONCENTRATIONS VARY     SIGNIFICANTLY. A RANGE OF 10-30     ug/mL MAY BE AN EFFECTIVE     CONCENTRATION FOR MANY PATIENTS.     HOWEVER, SOME ARE BEST TREATED     AT CONCENTRATIONS OUTSIDE THIS     RANGE.     ACETAMINOPHEN CONCENTRATIONS     >150 ug/mL AT 4 HOURS AFTER     INGESTION AND >50 ug/mL AT 12     HOURS AFTER INGESTION ARE     OFTEN ASSOCIATED WITH TOXIC     REACTIONS.  CBC      Status: Abnormal   Collection Time    01/14/14 10:42 PM      Result Value Ref Range   WBC 7.2  4.0 - 10.5 K/uL   RBC 3.92 (*) 4.22 - 5.81 MIL/uL   Hemoglobin 14.1  13.0 - 17.0 g/dL   HCT 38.6 (*) 39.0 - 52.0 %   MCV 98.5  78.0 - 100.0 fL   MCH 36.0 (*) 26.0 - 34.0 pg   MCHC 36.5 (*) 30.0 - 36.0 g/dL   RDW 12.8  11.5 - 15.5 %   Platelets 89 (*) 150 - 400 K/uL   Comment: REPEATED TO VERIFY     SPECIMEN CHECKED FOR CLOTS     PLATELET COUNT CONFIRMED BY SMEAR  COMPREHENSIVE METABOLIC PANEL     Status: Abnormal   Collection Time    01/14/14 10:42 PM      Result Value Ref Range   Sodium 125 (*) 137 - 147 mEq/L   Potassium 4.9  3.7 - 5.3 mEq/L   Chloride 82 (*) 96 - 112 mEq/L   CO2 27  19 - 32 mEq/L   Glucose, Bld 207 (*) 70 - 99 mg/dL   BUN 8  6 - 23 mg/dL   Creatinine, Ser 0.54  0.50 - 1.35 mg/dL   Calcium 9.6  8.4 - 10.5 mg/dL   Total Protein 8.5 (*) 6.0 - 8.3 g/dL   Albumin 3.5  3.5 - 5.2 g/dL   AST 107 (*) 0 - 37 U/L   Comment: SLIGHT HEMOLYSIS     HEMOLYSIS AT THIS LEVEL MAY AFFECT RESULT   ALT 93 (*) 0 - 53 U/L   Alkaline Phosphatase 121 (*) 39 - 117 U/L   Total Bilirubin 0.7  0.3 - 1.2 mg/dL   GFR calc non Af Amer >90  >90 mL/min   GFR calc Af Amer >90  >90 mL/min   Comment: (NOTE)     The eGFR has been calculated using the CKD EPI equation.  This calculation has not been validated in all clinical situations.     eGFR's persistently <90 mL/min signify possible Chronic Kidney     Disease.   Anion gap 16 (*) 5 - 15  ETHANOL     Status: Abnormal   Collection Time    01/14/14 10:42 PM      Result Value Ref Range   Alcohol, Ethyl (B) 278 (*) 0 - 11 mg/dL   Comment:            LOWEST DETECTABLE LIMIT FOR     SERUM ALCOHOL IS 11 mg/dL     FOR MEDICAL PURPOSES ONLY  SALICYLATE LEVEL     Status: Abnormal   Collection Time    01/14/14 10:42 PM      Result Value Ref Range   Salicylate Lvl <2.9 (*) 2.8 - 20.0 mg/dL   Labs are reviewed and are pertinent for UDS  negative, ETOH 278, See other abnormal values above; Medication reviewed and no changes made  .  Current Facility-Administered Medications  Medication Dose Route Frequency Provider Last Rate Last Dose  . ibuprofen (ADVIL,MOTRIN) tablet 600 mg  600 mg Oral Q8H PRN April K Palumbo-Rasch, MD      . LORazepam (ATIVAN) tablet 1 mg  1 mg Oral Q30 min PRN Tanna Furry, MD   1 mg at 01/15/14 1031  . ondansetron (ZOFRAN) tablet 4 mg  4 mg Oral Q8H PRN April K Palumbo-Rasch, MD       Current Outpatient Prescriptions  Medication Sig Dispense Refill  . LORazepam (ATIVAN) 1 MG tablet Take 1 mg by mouth every 8 (eight) hours as needed for anxiety.      . metFORMIN (GLUCOPHAGE) 1000 MG tablet Take 1,000 mg by mouth 2 (two) times daily.        Psychiatric Specialty Exam:     Blood pressure 156/86, pulse 104, temperature 97.9 F (36.6 C), temperature source Oral, resp. rate 12, SpO2 93.00%.There is no weight on file to calculate BMI.  General Appearance: Casual and Disheveled  Eye Contact::  Good  Speech:  Clear and Coherent and Normal Rate  Volume:  Normal  Mood:  Irritable  Affect:  Congruent  Thought Process:  Circumstantial  Orientation:  Full (Time, Place, and Person)  Thought Content:  Rumination  Suicidal Thoughts:  No  Homicidal Thoughts:  No  Memory:  Immediate;   Good Recent;   Good Remote;   Good  Judgement:  Fair  Insight:  Fair  Psychomotor Activity:  Normal  Concentration:  Fair  Recall:  Good  Fund of Knowledge:Good  Language: Good  Akathisia:  No  Handed:  Right  AIMS (if indicated):     Assets:  Communication Skills Desire for Improvement  Sleep:      Musculoskeletal: Strength & Muscle Tone: within normal limits Gait & Station: normal Patient leans: N/A  Treatment Plan Summary:  Patient to be referred to inpatient detox facilities.  If no bed availability patient may be discharge home with outpatient resources for substance/alcohol abuse.  Earleen Newport,  FNP-BC 01/15/2014 10:47 AM

## 2014-02-02 ENCOUNTER — Encounter (HOSPITAL_COMMUNITY): Payer: Self-pay | Admitting: Emergency Medicine

## 2014-02-02 ENCOUNTER — Emergency Department (HOSPITAL_COMMUNITY)
Admission: EM | Admit: 2014-02-02 | Discharge: 2014-02-02 | Disposition: A | Discharge status: Home / Self Care | Payer: Self-pay | Attending: Emergency Medicine | Admitting: Emergency Medicine

## 2014-02-02 DIAGNOSIS — M79609 Pain in unspecified limb: Secondary | ICD-10-CM | POA: Insufficient documentation

## 2014-02-02 DIAGNOSIS — Z8619 Personal history of other infectious and parasitic diseases: Secondary | ICD-10-CM | POA: Insufficient documentation

## 2014-02-02 DIAGNOSIS — L03119 Cellulitis of unspecified part of limb: Principal | ICD-10-CM

## 2014-02-02 DIAGNOSIS — F411 Generalized anxiety disorder: Secondary | ICD-10-CM | POA: Insufficient documentation

## 2014-02-02 DIAGNOSIS — Z9889 Other specified postprocedural states: Secondary | ICD-10-CM | POA: Insufficient documentation

## 2014-02-02 DIAGNOSIS — F17200 Nicotine dependence, unspecified, uncomplicated: Secondary | ICD-10-CM | POA: Insufficient documentation

## 2014-02-02 DIAGNOSIS — E119 Type 2 diabetes mellitus without complications: Secondary | ICD-10-CM | POA: Insufficient documentation

## 2014-02-02 DIAGNOSIS — L03116 Cellulitis of left lower limb: Secondary | ICD-10-CM

## 2014-02-02 DIAGNOSIS — Z79899 Other long term (current) drug therapy: Secondary | ICD-10-CM | POA: Insufficient documentation

## 2014-02-02 DIAGNOSIS — Z862 Personal history of diseases of the blood and blood-forming organs and certain disorders involving the immune mechanism: Secondary | ICD-10-CM | POA: Insufficient documentation

## 2014-02-02 DIAGNOSIS — L02419 Cutaneous abscess of limb, unspecified: Secondary | ICD-10-CM | POA: Insufficient documentation

## 2014-02-02 DIAGNOSIS — Z23 Encounter for immunization: Secondary | ICD-10-CM | POA: Insufficient documentation

## 2014-02-02 MED ORDER — GABAPENTIN 300 MG PO CAPS: 300.0000 mg | ORAL_CAPSULE | Freq: Three times a day (TID) | ORAL | Status: DC

## 2014-02-02 MED ORDER — TETANUS-DIPHTH-ACELL PERTUSSIS 5-2.5-18.5 LF-MCG/0.5 IM SUSP
0.5000 mL | Freq: Once | INTRAMUSCULAR | Status: AC
  Administered 2014-02-02: 0.5 mL via INTRAMUSCULAR
  Filled 2014-02-02: qty 0.5

## 2014-02-02 MED ORDER — METFORMIN HCL 1000 MG PO TABS: 1000.0000 mg | ORAL_TABLET | Freq: Two times a day (BID) | ORAL | Status: DC

## 2014-02-02 MED ORDER — CEPHALEXIN 500 MG PO CAPS: 500.0000 mg | ORAL_CAPSULE | Freq: Four times a day (QID) | ORAL | Status: DC

## 2014-02-02 NOTE — Discharge Instructions (Signed)
Please follow up with your primary care physician in 1-2 days. If you do not have one please call the [facility] number listed above. Please take your antibiotic until completion. Please read all discharge instructions and return precautions.   Cellulitis Cellulitis is an infection of the skin and the tissue beneath it. The infected area is usually red and tender. Cellulitis occurs most often in the arms and lower legs.  CAUSES  Cellulitis is caused by bacteria that enter the skin through cracks or cuts in the skin. The most common types of bacteria that cause cellulitis are staphylococci and streptococci. SIGNS AND SYMPTOMS   Redness and warmth.  Swelling.  Tenderness or pain.  Fever. DIAGNOSIS  Your health care provider can usually determine what is wrong based on a physical exam. Blood tests may also be done. TREATMENT  Treatment usually involves taking an antibiotic medicine. HOME CARE INSTRUCTIONS   Take your antibiotic medicine as directed by your health care provider. Finish the antibiotic even if you start to feel better.  Keep the infected arm or leg elevated to reduce swelling.  Apply a warm cloth to the affected area up to 4 times per day to relieve pain.  Take medicines only as directed by your health care provider.  Keep all follow-up visits as directed by your health care provider. SEEK MEDICAL CARE IF:   You notice red streaks coming from the infected area.  Your red area gets larger or turns dark in color.  Your bone or joint underneath the infected area becomes painful after the skin has healed.  Your infection returns in the same area or another area.  You notice a swollen bump in the infected area.  You develop new symptoms.  You have a fever. SEEK IMMEDIATE MEDICAL CARE IF:   You feel very sleepy.  You develop vomiting or diarrhea.  You have a general ill feeling (malaise) with muscle aches and pains. MAKE SURE YOU:    Understand these instructions.  Will watch your condition.  Will get help right away if you are not doing well or get worse. Document Released: 02/03/2005 Document Revised: 09/10/2013 Document Reviewed: 07/12/2011 [facility] Patient Information 2015 [name], [place]. This information is not intended to replace advice given to you by your health care provider. Make sure you discuss any questions you have with your health care provider.

## 2014-02-02 NOTE — ED Notes (Signed)
Pt. Stated, i injured my left leg while trimming bushes. I think it was getting infected.

## 2014-02-02 NOTE — ED Provider Notes (Signed)
CSN: [id number]     Arrival date & time 02/02/14  0919 History  This chart was scribed for non-physician practitioner working with No att. providers found by Mercy Moore, ED Scribe. This patient was seen in room TR06C/TR06C and the patient's care was started at 9:38 AM.   Chief Complaint  Patient presents with  . Leg Injury   The history is provided by the patient. No language interpreter was used.   HPI Comments: [name] is a 58 y.o. male who presents to the Emergency Department complaining of left leg injury incurred ten days ago, when walking through bushes which had recently been trimmed. Patient is concerned his wounds may be infected. Patient presents with several small, red, scabbed abrasions to his left lower leg. He reports treatment with dressing and antibiotic cream.  Patient denies fevers, chills, or new numbness/weakness in the left leg. Patient is uncertain of Tetanus status and is due for update.  Past Medical History  Diagnosis Date  . Hepatitis C   . Diabetes mellitus   . Chronic hyponatremia     pt denies having   . Alcoholic   . Alcoholic cirrhosis of liver     Pt calls this a false diagnosis. Pt denies  . Thrombocytopenia   . Hyperglycemia   . Depression   . Anxiety    Past Surgical History  Procedure Laterality Date  . Plate in lower right leg  2008  . Fracture surgery  right lower leg plate placed years ago   Family History  Problem Relation Age of Onset  . ALS Father    History  Substance Use Topics  . Smoking status: Current Every Day Smoker -- 1.00 packs/day for 40 years    Types: Cigarettes  . Smokeless tobacco: Never Used  . Alcohol Use: 9.0 oz/week    15 Cans of beer per week     Comment: 18/daily    Review of Systems  Constitutional: Negative for fever and chills.  Musculoskeletal: Positive for arthralgias.  Skin: Positive for wound.  All other systems reviewed and are negative.  Allergies  Benadryl; Librium; and Sulfa  antibiotics  Home Medications   Prior to Admission medications   Medication Sig Start Date End Date Taking? Authorizing Provider  ibuprofen (ADVIL,MOTRIN) 200 MG tablet Take 800 mg by mouth every 6 (six) hours as needed (pain).   Yes Historical Provider, MD  metFORMIN (GLUCOPHAGE) 1000 MG tablet Take 1 tablet (1,000 mg total) by mouth 2 (two) times daily with a meal. 01/15/14  Yes Shuvon Rankin, NP  cephALEXin (KEFLEX) 500 MG capsule Take 1 capsule (500 mg total) by mouth 4 (four) times daily. 02/02/14   Antiono Ettinger L Temiloluwa Laredo, PA-C  gabapentin (NEURONTIN) 300 MG capsule Take 1 capsule (300 mg total) by mouth 3 (three) times daily. 02/02/14   Miki Blank L Huxton Glaus, PA-C  metFORMIN (GLUCOPHAGE) 1000 MG tablet Take 1 tablet (1,000 mg total) by mouth 2 (two) times daily. 02/02/14   Stephani Police Faolan [place], PA-C   Triage Vitals: BP 125/77  Pulse 96  Temp(Src) 98 F (36.7 C) (Oral)  Resp 19  Ht 6' (1.829 m)  Wt 165 lb (74.844 kg)  BMI 22.37 kg/m2  SpO2 96%  Physical Exam  Nursing note and vitals reviewed. Constitutional: He is oriented to person, place, and time. He appears well-developed and well-nourished. No distress.  HENT:  Head: Normocephalic and atraumatic.  Right Ear: External ear normal.  Left Ear: External ear normal.  Nose: Nose normal.  Mouth/Throat: Oropharynx is clear and moist.  Eyes: Conjunctivae are normal.  Neck: Normal range of motion. Neck supple.  Cardiovascular: Normal rate.   Pulmonary/Chest: Effort normal.  Abdominal: Soft.  Musculoskeletal: Normal range of motion. He exhibits no edema and no tenderness.  Neurological: He is alert and oriented to person, place, and time.  Sensation intact grossly.  Skin: Skin is warm and dry. He is not diaphoretic.     Psychiatric: He has a normal mood and affect.    ED Course  Procedures (including critical care time) Medications  Tdap (BOOSTRIX) injection 0.5 mL (0.5 mLs Intramuscular Given 02/02/14 1003)     COORDINATION OF CARE: 9:44 AM- Discussed treatment plan with patient at bedside and patient agreed to plan.   Labs Review Labs Reviewed - No data to display  Imaging Review No results found.   EKG Interpretation None      MDM   Final diagnoses:  Cellulitis of left lower extremity    Filed Vitals:   02/02/14 0931  BP: 125/77  Pulse: 96  Temp: 98 F (36.7 C)  Resp: 19   Afebrile, NAD, non-toxic appearing, AAOx4.  Neurovascularly intact. Normal sensation. No evidence of compartment syndrome. Tdap updated.  Suspect uncomplicated cellulitis based on limited area of involvement, minimal pain, no systemic signs of illness (eg, fever, chills, dehydration, altered mental status, tachypnea, tachycardia, hypotension), no risk factors for serious illness (eg, extremes of age, general debility, immunocompromised status). PE reveals redness, swelling, mildly tender, warm to touch. Skin intact, No bleeding. No bullae. Non purulent. Non circumferential. No suspicion for occult abscess. Will prescribed Keflex, direct pt to apply warm compresses and to return to ED for I&D if pain should increase or abscess should develop. Refilled patients metformin and gabapentin for patient. Advised PCP f/u. Return precautions discussed. Patient is agreeable to plan.  Patient is stable at time of discharge.     I personally performed the services described in this documentation, which was scribed in my presence. The recorded information has been reviewed and is accurate.    Harlow Mares, PA-C 02/02/14 1035

## 2014-02-03 NOTE — ED Provider Notes (Signed)
Medical screening examination/treatment/procedure(s) were performed by non-physician practitioner and as supervising physician I was immediately available for consultation/collaboration.   EKG Interpretation None        Tanna Furry, MD 02/03/14 1523

## 2014-02-28 ENCOUNTER — Emergency Department (HOSPITAL_COMMUNITY)
Admission: EM | Admit: 2014-02-28 | Discharge: 2014-02-28 | Disposition: A | Discharge status: Home / Self Care | Payer: Self-pay | Attending: Emergency Medicine | Admitting: Emergency Medicine

## 2014-02-28 ENCOUNTER — Encounter (HOSPITAL_COMMUNITY): Payer: Self-pay | Admitting: Emergency Medicine

## 2014-02-28 DIAGNOSIS — Z79899 Other long term (current) drug therapy: Secondary | ICD-10-CM | POA: Insufficient documentation

## 2014-02-28 DIAGNOSIS — Z794 Long term (current) use of insulin: Secondary | ICD-10-CM | POA: Insufficient documentation

## 2014-02-28 DIAGNOSIS — Z8659 Personal history of other mental and behavioral disorders: Secondary | ICD-10-CM | POA: Insufficient documentation

## 2014-02-28 DIAGNOSIS — Z8719 Personal history of other diseases of the digestive system: Secondary | ICD-10-CM | POA: Insufficient documentation

## 2014-02-28 DIAGNOSIS — W138XXA Fall from, out of or through other building or structure, initial encounter: Secondary | ICD-10-CM | POA: Insufficient documentation

## 2014-02-28 DIAGNOSIS — S80812A Abrasion, left lower leg, initial encounter: Secondary | ICD-10-CM | POA: Insufficient documentation

## 2014-02-28 DIAGNOSIS — Y9331 Activity, mountain climbing, rock climbing and wall climbing: Secondary | ICD-10-CM | POA: Insufficient documentation

## 2014-02-28 DIAGNOSIS — Z8619 Personal history of other infectious and parasitic diseases: Secondary | ICD-10-CM | POA: Insufficient documentation

## 2014-02-28 DIAGNOSIS — S81801A Unspecified open wound, right lower leg, initial encounter: Secondary | ICD-10-CM | POA: Insufficient documentation

## 2014-02-28 DIAGNOSIS — Y9289 Other specified places as the place of occurrence of the external cause: Secondary | ICD-10-CM | POA: Insufficient documentation

## 2014-02-28 DIAGNOSIS — S80811A Abrasion, right lower leg, initial encounter: Secondary | ICD-10-CM

## 2014-02-28 DIAGNOSIS — Z862 Personal history of diseases of the blood and blood-forming organs and certain disorders involving the immune mechanism: Secondary | ICD-10-CM | POA: Insufficient documentation

## 2014-02-28 DIAGNOSIS — L089 Local infection of the skin and subcutaneous tissue, unspecified: Secondary | ICD-10-CM | POA: Insufficient documentation

## 2014-02-28 DIAGNOSIS — Z720 Tobacco use: Secondary | ICD-10-CM | POA: Insufficient documentation

## 2014-02-28 DIAGNOSIS — E1140 Type 2 diabetes mellitus with diabetic neuropathy, unspecified: Secondary | ICD-10-CM | POA: Insufficient documentation

## 2014-02-28 LAB — CBG MONITORING, ED: GLUCOSE-CAPILLARY: 173 mg/dL — AB (ref 70–99)

## 2014-02-28 MED ORDER — CLINDAMYCIN HCL 150 MG PO CAPS
600.0000 mg | ORAL_CAPSULE | Freq: Once | ORAL | Status: AC
  Administered 2014-02-28: 600 mg via ORAL
  Filled 2014-02-28: qty 4

## 2014-02-28 MED ORDER — CLINDAMYCIN HCL 150 MG PO CAPS
450.0000 mg | ORAL_CAPSULE | Freq: Three times a day (TID) | ORAL | Status: DC
Start: 2014-02-28 — End: 2014-11-20

## 2014-02-28 NOTE — Discharge Instructions (Signed)
1. Medications: Tylenol or ibuprofen for pain, clindamycin, usual home medications 2. Treatment: ice for swelling, keep wound clean with warm soap and water and keep bandage dry, do not submerge in water for 24 hours 3. Follow Up: Please return in 3 days for wound check or sooner if you have concerns. Return to the emergency department for increased redness, drainage of pus from the wound   WOUND CARE  Keep area clean and dry for 24 hours. Do not remove bandage, if applied.  After 24 hours, remove bandage and wash wound gently with mild soap and warm water. Reapply a new bandage after cleaning wound, if directed.   Continue daily cleansing with soap and water until stitches/staples are removed.  Do not apply any ointments or creams to the wound while stitches/staples are in place, as this may cause delayed healing. Return if you experience any of the following signs of infection: Swelling, redness, pus drainage, streaking, fever >101.0 F  Return if you experience excessive bleeding that does not stop after 15-20 minutes of constant, firm pressure.  Insulin Treatment for Diabetes Diabetes is a disease that does not go away (chronic). It occurs when the body does not properly use the sugar (glucose) that is released from food after it is digested. Glucose levels are controlled by a hormone called insulin, which is made by your pancreas. Depending on the type of diabetes you have, either of the following will apply:   The pancreas does not make any insulin (type 1 diabetes).  The pancreas makes too little insulin, and the body cannot respond normally to the insulin that is made (type 2 diabetes). Without insulin, death can occur. However, with the addition of insulin, blood sugar monitoring, and treatment, someone with diabetes can live a full and productive life. This document will discuss the role of insulin in your treatment and provide information about its use.  HOW IS INSULIN  GIVEN? Insulin is a medicine that can only be given by injection. Taking it by mouth makes it inactive because of the acid in your stomach. Insulin is injected under the skin by a syringe and needle, an insulin pen, a pump, or a jet injector. Your dose will be determined by your health care provider based on your individual needs. You will also be given guidance on which method of giving insulin is right for you. Remember that if you give insulin with a needle and syringe, you must do so using only a special insulin syringe made for this purpose. WHERE ON THE BODY SHOULD INSULIN BE INJECTED? Insulin is injected into the fatty layer of tissue just under your skin. Good places to inject insulin include the upper arm, the front and outer area of the thigh, the hips, and the abdomen. Giving your insulin in the abdomen is preferred because this provides the most rapid and consistent absorption. Avoid the area 2 inches (5 cm) around the navel and avoid injecting into areas on your body with scar tissue. In addition, it is important to rotate your injection sites with every shot to prevent irritation and improve absorption. WHAT ARE THE DIFFERENT TYPES OF INSULIN?  If you have type 1 diabetes, you must take insulin to stay alive. Your body does not produce it. If you have type 2 diabetes, you might require insulin in addition to, or instead of, other medicines. In either case, proper use of insulin is critical to control your diabetes.  There are a number of different types of insulin.  Usually, you will give yourself injections, though others can be trained to give them to you. Some people have an insulin pump that delivers insulin continuously through a tube (cannula) that is placed under the skin. Using insulin requires that you check your blood sugar several times a day. The exact number of times and time of day to check will vary depending on your type of diabetes, your type of insulin, and treatment goals.  Your health care provider will direct you.  Generally, different insulins have different properties. The following is a general guide. Specifics will vary by product, and new products are introduced periodically.   Rapid-acting insulin starts working quickly (in as little as 5 minutes) and wears off in 4 to 6 hours (sometimes longer). This type of insulin works well when taken just before a meal to bring your blood sugar quickly back to normal.   Short-acting insulin starts working in about 30 minutes and can last 6 to 10 hours. This type of insulin should be taken about 30 minutes before you start eating a meal.  Intermediate-acting insulin starts working in 1-2 hours and wears off after about 10 to 18 hours. This insulin will lower your blood sugar for a longer period of time, but it will not be as effective in lowering your blood sugar right after a meal.   Long-acting insulin mimics the small amount of insulin that your pancreas usually produces throughout the day. You need to have some insulin present at all times. It is crucial to the metabolism of brain cells and other cells. Long-acting insulin is meant to be used either once or twice a day. It is usually used in combination with other types of insulin, or in combination with other diabetes medicines.  Discuss the type of insulin you are taking with your health care provider or pharmacist. You will then be aware of when the insulin can be expected to peak and when it will wear off. This is important to know so you can plan for meal times and periods of exercise.  Your health care provider will usually have a strategy in mind when treating you with insulin. This will vary with your type of diabetes, your diabetes treatment goals, and your health history. It is important that you understand this strategy so you can be an active partner in treating your diabetes. Here are some terms you might hear:   Basal insulin. This refers to the small  amount of insulin that needs to be present in your blood at all times. Sometimes oral medicines will be enough. For other people, and especially for people with type 1 diabetes, insulin is needed. Usually, intermediate-acting or long-acting insulin is used once or twice a day to accomplish this.   Prandial (meal-related) insulin. Your blood sugar will rise rapidly after a meal. Rapid-acting or short-acting insulin can be used right before the meal to bring your blood sugar back to normal quickly. You might be instructed to adjust the amount of insulin depending on how much carbohydrate (starch) is in your meal.   Corrective insulin. You might be instructed to check your blood sugar at certain times of the day. You then might use a small amount of rapid-acting or short-acting insulin to bring the blood sugar down to normal if it is elevated.   Tight control (also called intensive therapy). Tight control means keeping your blood sugar as close to your target as possible and keeping it from going too high after meals. People  with tight control of their diabetes are shown to have fewer long-term problems from their diabetes.   Glycohemoglobin (also called glyco, glycosylated hemoglobin, hemoglobin A1c, or A1c) level. This measures how well your blood sugar has been controlled during the past 1 to 3 months. It helps your health care provider see how effective your treatment is and decide if any changes are needed. Your health care provider will discuss your target glycohemoglobin level with you.  Insulin treatment requires your careful attention. Treatment plans will be different for different people. Some people do well with a simple program. Others require more complicated programs, with multiple insulin injections daily. You will work with your health care provider to develop the best program for you. Regardless of your insulin treatment plan, you must also do your best on weight control, diet and food  choices, exercise, blood pressure control, cholesterol control, and stress levels.  WHAT ARE THE SIDE EFFECTS OF INSULIN? Although insulin treatment is important, it does have some side effects, such as:   Insulin can cause your blood sugar to go too low (hypoglycemia).   Weight gain can occur.   Improper injection technique can cause hypoglycemia, blood sugar to go too high (hyperglycemia), skin injury or irritation, or other problems. You must learn to inject insulin properly. Document Released: 07/23/2008 Document Revised: 09/10/2013 Document Reviewed: 10/08/2012 [facility] Patient Information 2015 [place], [place]. This information is not intended to replace advice given to you by your health care provider. Make sure you discuss any questions you have with your health care provider.

## 2014-02-28 NOTE — ED Notes (Signed)
Pt concerned about wound on left shin from accident last Friday. Pt states the wound isnt healing fast enough and is concerned because he is a diabetic.

## 2014-02-28 NOTE — ED Provider Notes (Signed)
CSN: [id number]     Arrival date & time 02/28/14  2058 History   This chart was scribed for non-physician practitioner Abigail Butts, PA-C working with Ephraim Hamburger, MD by Hilda Lias, ED Scribe. This patient was seen in room TR05C/TR05C and the patient's care was started at 9:50 PM.    Chief Complaint  Patient presents with  . Leg Pain     (Consider location/radiation/quality/duration/timing/severity/associated sxs/prior Treatment) The history is provided by medical records and the patient. No language interpreter was used.    HPI Comments: [name] is a 58 y.o. male who presents to the Emergency Department complaining of constant stinging pain to an abrasion on his right lower leg. The injury occurred at work climbing on four-foot wall 6 days ago. Pt had on pants when incident occurred. Pt decided to take off wrap and dressing today at work and notes that the wound felt "raw". Pt is type-2 diabetic taking metformin and using insulin. Pt is complaining of dryness and red skin around the area as well. Pt reports having a subjective fever a few nights this week and could not sleep because of the pain at the site of injury. Pt has been using antibiotic ointment with some improvement. Pt notes that he had another abrasion like this on his left leg recently as well, which healed completely after a course of antibiotics. Pt denies chills, nausea, and vomiting.  Patient has been taking insulin for less than 2 weeks. He is currently taking NovoLog and reports some persistent polyuria and polydipsia but is otherwise asymptomatic.  Past Medical History  Diagnosis Date  . Hepatitis C   . Diabetes mellitus   . Chronic hyponatremia     pt denies having   . Alcoholic   . Alcoholic cirrhosis of liver     Pt calls this a false diagnosis. Pt denies  . Thrombocytopenia   . Hyperglycemia   . Depression   . Anxiety    Past Surgical History  Procedure Laterality Date  . Plate in  lower right leg  2008  . Fracture surgery  right lower leg plate placed years ago   Family History  Problem Relation Age of Onset  . ALS Father    History  Substance Use Topics  . Smoking status: Current Every Day Smoker -- 1.00 packs/day for 40 years    Types: Cigarettes  . Smokeless tobacco: Never Used  . Alcohol Use: No     Comment: 19/daily    Review of Systems  Constitutional: Positive for fever (subjective). Negative for chills, diaphoresis, appetite change, fatigue and unexpected weight change.  HENT: Negative for mouth sores.   Eyes: Negative for visual disturbance.  Respiratory: Negative for cough, chest tightness, shortness of breath and wheezing.   Cardiovascular: Negative for chest pain.  Gastrointestinal: Negative for nausea, vomiting, abdominal pain, diarrhea and constipation.  Endocrine: Positive for polydipsia and polyuria.  Genitourinary: Negative for dysuria, urgency, frequency and hematuria.  Musculoskeletal: Positive for myalgias (right lower leg). Negative for back pain and neck stiffness.  Skin: Positive for wound. Negative for rash.  Allergic/Immunologic: Negative for immunocompromised state.  Neurological: Negative for syncope, light-headedness and headaches.  Hematological: Does not bruise/bleed easily.  Psychiatric/Behavioral: Negative for sleep disturbance. The patient is not nervous/anxious.       Allergies  Benadryl; Librium; and Sulfa antibiotics  Home Medications   Prior to Admission medications   Medication Sig Start Date End Date Taking? Authorizing Provider  ibuprofen (ADVIL,MOTRIN) 200 MG  tablet Take 800 mg by mouth every 6 (six) hours as needed (pain).   Yes Historical Provider, MD  insulin aspart (NOVOLOG) 100 UNIT/ML injection Inject 10 Units into the skin 2 (two) times daily.   Yes Historical Provider, MD  metFORMIN (GLUCOPHAGE) 1000 MG tablet Take 1 tablet (1,000 mg total) by mouth 2 (two) times daily with a meal. 01/15/14  Yes Shuvon  Rankin, NP  clindamycin (CLEOCIN) 150 MG capsule Take 3 capsules (450 mg total) by mouth 3 (three) times daily. 02/28/14   Simuel Stebner, PA-C   BP 150/80  Pulse 87  Temp(Src) 98.4 F (36.9 C) (Oral)  Resp 16  Ht 6' (1.829 m)  Wt 165 lb (74.844 kg)  BMI 22.37 kg/m2  SpO2 99% Physical Exam  Nursing note and vitals reviewed. Constitutional: He is oriented to person, place, and time. He appears well-developed and well-nourished. No distress.  HENT:  Head: Normocephalic and atraumatic.  Eyes: Conjunctivae are normal. No scleral icterus.  Neck: Normal range of motion.  Cardiovascular: Normal rate, regular rhythm, normal heart sounds and intact distal pulses.   No murmur heard. Capillary refill < 3 sec  Pulmonary/Chest: Effort normal and breath sounds normal. No respiratory distress.  Musculoskeletal: Normal range of motion. He exhibits no edema.  ROM: Range of motion of the right leg  Neurological: He is alert and oriented to person, place, and time.  Sensation: Intact to dull and sharp Strength: 5 out of 5 in the bilateral lower extremities  Skin: Skin is warm and dry. He is not diaphoretic. There is erythema.  4 x 3 cm skin tear to the right shin with surrounding erythema and very mild induration and wound edges, healing abrasion to the distal right shin with erythema but no induration No evidence of gross abscess, no area of fluctuance or edema  Psychiatric: He has a normal mood and affect.    ED Course  Procedures (including critical care time)  DIAGNOSTIC STUDIES: Oxygen Saturation is 99% on RA, Normal by my interpretation.    COORDINATION OF CARE: 9:55 PM Discussed treatment plan with pt at bedside and pt agreed to plan.   Labs Review Labs Reviewed  CBG MONITORING, ED - Abnormal; Notable for the following:    Glucose-Capillary 173 (*)    All other components within normal limits    Imaging Review No results found.   EKG Interpretation None      MDM    Final diagnoses:  Abrasion of leg, right, infected, initial encounter  Type 2 diabetes mellitus with diabetic neuropathy   Faustino Congress presents with right leg abrasion and very mild surrounding cellulitis.  Pt is without risk factors for HIV; no recent use of steroids or other immunosuppressive medications.  Patient has a history of insulin-dependent diabetes and CBG is 173 today.  Pt is without gross abscess for which I&D would be possible.  Area marked and pt encouraged to return if redness begins to streak, extends beyond the markings, and/or fever or nausea/vomiting develop.  Pt is alert, oriented, NAD, afebrile, non tachycardic, nonseptic and nontoxic appearing.  Pt to be d/c on oral antibiotics with strict f/u instructions.    I have personally reviewed patient's vitals, nursing note and any pertinent labs or imaging.  I performed an focused physical exam; undressed when appropriate .    It has been determined that no acute conditions requiring further emergency intervention are present at this time. The patient/guardian have been advised of the diagnosis and plan.  I reviewed any labs and imaging including any potential incidental findings. We have discussed signs and symptoms that warrant return to the ED and they are listed in the discharge instructions.    Vital signs are stable at discharge.   BP 150/80  Pulse 87  Temp(Src) 98.4 F (36.9 C) (Oral)  Resp 16  Ht 6' (1.829 m)  Wt 165 lb (74.844 kg)  BMI 22.37 kg/m2  SpO2 99%   I personally performed the services described in this documentation, which was scribed in my presence. The recorded information has been reviewed and is accurate.    Jarrett Soho Zaron Zwiefelhofer, PA-C 02/28/14 2230

## 2014-02-28 NOTE — ED Notes (Signed)
Patient presents via EMS. Patient fell off of a retaining wall last Friday and his right shin slid down the wall and caused an abrasion.  Area healing, no drainage noted slight swelling.  Pain has increased to 3/10.   Reports 152/88, cbg 184

## 2014-03-01 NOTE — ED Provider Notes (Signed)
Medical screening examination/treatment/procedure(s) were performed by non-physician practitioner and as supervising physician I was immediately available for consultation/collaboration.   EKG Interpretation None        Ephraim Hamburger, MD 03/01/14 [phone]

## 2014-03-06 ENCOUNTER — Emergency Department (HOSPITAL_COMMUNITY)
Admission: EM | Admit: 2014-03-06 | Discharge: 2014-03-06 | Disposition: A | Discharge status: Home / Self Care | Payer: Self-pay | Attending: Emergency Medicine | Admitting: Emergency Medicine

## 2014-03-06 ENCOUNTER — Encounter (HOSPITAL_COMMUNITY): Payer: Self-pay | Admitting: Emergency Medicine

## 2014-03-06 DIAGNOSIS — Z79899 Other long term (current) drug therapy: Secondary | ICD-10-CM | POA: Insufficient documentation

## 2014-03-06 DIAGNOSIS — Z794 Long term (current) use of insulin: Secondary | ICD-10-CM | POA: Insufficient documentation

## 2014-03-06 DIAGNOSIS — Z862 Personal history of diseases of the blood and blood-forming organs and certain disorders involving the immune mechanism: Secondary | ICD-10-CM | POA: Insufficient documentation

## 2014-03-06 DIAGNOSIS — Z8619 Personal history of other infectious and parasitic diseases: Secondary | ICD-10-CM | POA: Insufficient documentation

## 2014-03-06 DIAGNOSIS — Z8659 Personal history of other mental and behavioral disorders: Secondary | ICD-10-CM | POA: Insufficient documentation

## 2014-03-06 DIAGNOSIS — F1010 Alcohol abuse, uncomplicated: Secondary | ICD-10-CM | POA: Insufficient documentation

## 2014-03-06 DIAGNOSIS — Z792 Long term (current) use of antibiotics: Secondary | ICD-10-CM | POA: Insufficient documentation

## 2014-03-06 DIAGNOSIS — Z8719 Personal history of other diseases of the digestive system: Secondary | ICD-10-CM | POA: Insufficient documentation

## 2014-03-06 DIAGNOSIS — E1140 Type 2 diabetes mellitus with diabetic neuropathy, unspecified: Secondary | ICD-10-CM | POA: Insufficient documentation

## 2014-03-06 DIAGNOSIS — Z720 Tobacco use: Secondary | ICD-10-CM | POA: Insufficient documentation

## 2014-03-06 LAB — CBG MONITORING, ED: Glucose-Capillary: 197 mg/dL — ABNORMAL HIGH (ref 70–99)

## 2014-03-06 LAB — ETHANOL: ALCOHOL ETHYL (B): 198 mg/dL — AB (ref 0–11)

## 2014-03-06 NOTE — ED Provider Notes (Signed)
Medical screening examination/treatment/procedure(s) were performed by non-physician practitioner and as supervising physician I was immediately available for consultation/collaboration.    Dorie Rank, MD 03/06/14 [phone]

## 2014-03-06 NOTE — ED Notes (Signed)
Per [facility]: Homeless shelter wouldn't let pt in because he's drunk. Pt has no medical complaints. Ambulatory, A&O x 4. Pt denies any drug use, unsure of how much alcohol he has had.

## 2014-03-06 NOTE — ED Notes (Signed)
Pt brought his own insulin with him, took Novolog about an hour ago with food.

## 2014-03-06 NOTE — Discharge Instructions (Signed)
1. Medications: usual home medications 2. Treatment: rest, drink plenty of fluids, keep wound clean and dry 3. Follow Up: Please followup with your primary doctor in as able days for discussion of your diagnoses and further evaluation after today's visit; if you do not have a primary care doctor use the resource guide provided to find one; Please return to the ER for new symptoms    Emergency Department Resource Guide 1) Find a Doctor and Pay Out of Pocket Although you won't have to find out who is covered by your insurance plan, it is a good idea to ask around and get recommendations. You will then need to call the office and see if the doctor you have chosen will accept you as a new patient and what types of options they offer for patients who are self-pay. Some doctors offer discounts or will set up payment plans for their patients who do not have insurance, but you will need to ask so you aren't surprised when you get to your appointment.  2) Contact Your Local Health Department Not all health departments have doctors that can see patients for sick visits, but many do, so it is worth a call to see if yours does. If you don't know where your local health department is, you can check in your phone book. The CDC also has a tool to help you locate your state's health department, and many state websites also have listings of all of their local health departments.  3) Find a [facility] If your illness is not likely to be very severe or complicated, you may want to try a walk in clinic. These are popping up all over the country in pharmacies, drugstores, and shopping centers. They're usually staffed by nurse practitioners or physician assistants that have been trained to treat common illnesses and complaints. They're usually fairly quick and inexpensive. However, if you have serious medical issues or chronic medical problems, these are probably not your best option.  No Primary Care Doctor: - Call  Health Connect at  [phone] - they can help you locate a primary care doctor that  accepts your insurance, provides certain services, etc. - Physician Referral Service- [phone]  Chronic Pain Problems: Organization         Address  Phone   Notes  [facility]  [phone] Patients need to be referred by their primary care doctor.   Medication Assistance: Organization         Address  Phone   Notes  [facility] Medication [address] [place], [place] [address] [phone] --Must be a resident of [facility] -- Must have NO insurance coverage whatsoever (no Medicaid/ Medicare, etc.) -- The pt. MUST have a primary care doctor that directs their care regularly and follows them in the community   MedAssist  [phone]   [employer]  [phone]    Agencies that provide inexpensive medical care: Organization         Address  Phone   Notes  [place]  [phone]   [facility]    [phone]   [facility] [place],  [address] [phone]   [place] [address] [place] [phone]   Planned Parenthood    [phone]   [facility]    [phone]   [facility] and [place]  [phone]  [address], [place] Phone:  [phone], Fax:  [phone] Hours of Operation:  9 am - 6 pm, M-F.  Also accepts Medicaid/Medicare and self-pay.  [facility] for [place], [address], [place] Phone: [phone], Fax: [phone]. Hours of Operation:  8:30 am - 5:30 pm, M-F.  Also accepts Medicaid and self-pay.  [facility] [address], [place] Phone: [phone]   [place], [place], [place] [phone], Ext. [phone] Mondays & Thursdays: 7-9 AM.  First 15 patients are seen on a first come, first serve  basis.    [place] Providers:  Organization         Address  Phone   Notes  [facility] [address], [place] [phone] Also accepts self-pay patients.  [facility] [address], [place]  [phone]   [place], [address], [place] [phone]   [facility] [address] [place] [phone]   Lucianne Lei [address], [place]   [phone] Only accepts [place] Access [place] patients after they have their name applied to their card.   Self-Pay (no insurance) in [facility]:  Organization         Address  Phone   Notes  Sickle Cell Patients, [facility] [place] [phone]   [facility] [place] [phone]   [facility] [place]  [place], [place], [place] [phone]   [facility]/Dr. Osei-Bonsu  [address], [place] or [address], [place] [phone] Phone number for both [place] and [place] locations is the same.  Urgent Medical and [facility] [address], [place] [phone]   [facility] [address] [place] or [address] [phone]   [facility] [address] [place] [phone], phone; [phone], fax Sees patients 1st and 3rd Saturday of every month.  Must not qualify for public or private insurance (i.e. Medicaid, Medicare, [place] Health Choice, Veterans' Benefits)  Household income should be no more than 200% of the poverty level The clinic cannot treat you if you are pregnant or think you are pregnant  Sexually transmitted diseases are not treated at the clinic.    Dental Care: Organization         Address  Phone  Notes  [facility] Department of [facility] [place] of [place] [phone] Accepts children up to age 31 who are enrolled in [place] or [place]; pregnant women with a Medicaid card; and children who have applied for Medicaid or [place] Health Choice, but were declined, whose parents can pay a reduced fee at time of service.  [facility] Department of [facility]  [address], [place] [phone] Accepts children up to age 75 who are enrolled in [place] or [place]; pregnant women with a Medicaid card; and children who have applied for Medicaid or [place] Health Choice, but were declined, whose parents can pay a reduced fee at time of service.  [facility] Adult Dental Access PROGRAM  [place], [place] [phone] Patients  are seen by appointment only. Walk-ins are not accepted. [place] will see patients 23 years of age and older. Monday - Tuesday (8am-5pm) Most Wednesdays (8:30-5pm) $30 per visit, cash only  [facility] Adult Dental Access PROGRAM  [address], [facility] [phone] Patients are seen by appointment only. Walk-ins are not accepted. [place] will see patients 32 years of age and older. One Wednesday Evening (Monthly: Volunteer Based).  $30 per visit, cash only  [place]  [phone] for adults; Children under age 44, call Graduate Pediatric Dentistry at [phone]. Children aged 91-14, please call [phone] to request a pediatric application.  Dental services are provided in all areas of dental care including fillings, crowns and bridges, complete and partial dentures, implants, gum treatment, root canals, and extractions. Preventive care is also provided. Treatment is provided to both adults and children. Patients are selected via a lottery and there is often a waiting list.   [facility] [address], [place]  [phone]  [email or web address]   [facility] [address] [place], [place] [phone], Ext. [phone] Second and Fourth Thursday of each month, opens at 6:30 AM; Clinic ends at 9 AM.  Patients are seen on a first-come first-served basis, and a limited number are seen during each clinic.   [facility]  [address] Hillard Danker [place], [place] [phone]   Eligibility Requirements You must have lived in [place], [place], or [place] counties for at least the last three months.   You cannot be eligible for state or federal sponsored [employer], including [employer], [place], or [employer].   You generally cannot be eligible for healthcare insurance through your employer.    How to apply: Eligibility screenings are held every Tuesday and Wednesday afternoon from 1:00 pm until 4:00 pm. You do not need an appointment for the interview!  [facility] [address], [place], [place]   [place]  [place] Department  [place]  [phone]    Behavioral Health Resources in the Community: Intensive Outpatient Programs Organization         Address  Phone  Notes  [place] [address], [place], [place] [phone]   [facility] [address], [place], [place]   ADS: Alcohol [facility] Drug [facility] [address], [place], [place]   [place] [address],  [place], [place] or [phone]   Substance Abuse Resources Organization         Address  Phone  Notes  Alcohol and Drug Services  [phone]   [place]  [phone]   [facility] [place]   Chinita Pester  [phone]   Residential & Outpatient Substance Abuse Program  [phone]   Psychological Services Organization          Address  Phone  Notes  [facility]  [place]  [phone]   [place] [address] [place] or [phone]    Mobile Crisis Teams Organization         Address  Phone  Notes  [facility], Mobile Crisis Care Unit  [phone]   [facility]  [address] [place], [place]   [facility] [address] [place] 18  [place] [phone]    Self-Help/Support Groups Organization         Address  Phone             Notes  Mental Health Assoc. of [place] - variety of support groups  [place] Call for more information  Narcotics Anonymous (NA), [facility] [address], [employer] [place]  2 meetings at this location   Special educational needs teacher         Address  Phone  Notes  [facility]idential [facility] [place],    [place]  [phone]   [facility]  [address] [place] [phone], [place], [place]   [place], [place] [phone] Admissions: 8am-3pm M-F  Incentives Substance [place] [address]    [place], [place] [phone]   [facility] [address] [place], [place], [place]   [facility] [address],  [place], [place]   [facility] - Intensive Outpatient [address], [place], [place]   [facility] ([employer] [place].,  [place], [place] [phone] or [phone]   [facility] (RTS) [address], [place], [place] Accepts Medicaid  [facility] [place] [address]  [place] [phone] Substance Abuse/Addiction Treatment   [facility] Organization         Address  Phone  Notes  [facility]  [phone]   Domenic Schwab, PhD [address] Arlis Porta [place], [place]   [phone] or [phone]   [facility] [place]le [place], [place] [phone]   [facility] [address] [place], [place] [phone] Insurance/Medicaid/sponsorship through [facility] and Families [address], [address] [place]                                    [place], [place] [phone] [place] [address], [place] [phone]    Dr. Adele Schilder  [phone]   [facility] [place] Dept. 1) [address], [place] 2) [place] 3)  [place] [address], [place] [phone]  [phone]   [place] [phone] or [phone] (After Hours)

## 2014-03-06 NOTE — ED Provider Notes (Signed)
CSN: [id number]     Arrival date & time 03/06/14  2027 History   First MD Initiated Contact with Patient 03/06/14 2035     Chief Complaint  Patient presents with  . Alcohol Intoxication     (Consider location/radiation/quality/duration/timing/severity/associated sxs/prior Treatment) Patient is a 58 y.o. male presenting with intoxication. The history is provided by the patient and medical records. No language interpreter was used.  Alcohol Intoxication Pertinent negatives include no abdominal pain, chest pain, coughing, diaphoresis, fatigue, fever, headaches, nausea, rash or vomiting.    [name] is a 58 y.o. male  with a hx of IDDM, Hep C, alcoholism, depression, anxiety presents to the Emergency Department via EMS after the homeless shelter told him he was intoxicated and could not stay, prompting him to call EMS.  Pt reports he has a well healing wound to the right lower leg that he is taking antibiotics for, but has no complaints at this time.  EMS and RN reports that he ambulates without difficulty.  NO aggravating or alleviating factors.  Pt denies all somatic symptoms.    Past Medical History  Diagnosis Date  . Hepatitis C   . Diabetes mellitus   . Chronic hyponatremia     pt denies having   . Alcoholic   . Alcoholic cirrhosis of liver     Pt calls this a false diagnosis. Pt denies  . Thrombocytopenia   . Hyperglycemia   . Depression   . Anxiety    Past Surgical History  Procedure Laterality Date  . Plate in lower right leg  2008  . Fracture surgery  right lower leg plate placed years ago   Family History  Problem Relation Age of Onset  . ALS Father    History  Substance Use Topics  . Smoking status: Current Every Day Smoker -- 1.00 packs/day for 40 years    Types: Cigarettes  . Smokeless tobacco: Never Used  . Alcohol Use: Yes     Comment: 18/daily    Review of Systems  Constitutional: Negative for fever, diaphoresis, appetite change, fatigue and  unexpected weight change.  HENT: Negative for mouth sores.   Eyes: Negative for visual disturbance.  Respiratory: Negative for cough, chest tightness, shortness of breath and wheezing.   Cardiovascular: Negative for chest pain.  Gastrointestinal: Negative for nausea, vomiting, abdominal pain, diarrhea and constipation.  Endocrine: Negative for polydipsia, polyphagia and polyuria.  Genitourinary: Negative for dysuria, urgency, frequency and hematuria.  Musculoskeletal: Negative for back pain and neck stiffness.  Skin: Positive for wound (right leg - well healing). Negative for rash.  Allergic/Immunologic: Negative for immunocompromised state.  Neurological: Negative for syncope, light-headedness and headaches.  Hematological: Does not bruise/bleed easily.  Psychiatric/Behavioral: Negative for sleep disturbance. The patient is not nervous/anxious.       Allergies  Benadryl; Librium; and Sulfa antibiotics  Home Medications   Prior to Admission medications   Medication Sig Start Date End Date Taking? Authorizing Provider  clindamycin (CLEOCIN) 150 MG capsule Take 3 capsules (450 mg total) by mouth 3 (three) times daily. 02/28/14   Jabari Swoveland, PA-C  ibuprofen (ADVIL,MOTRIN) 200 MG tablet Take 800 mg by mouth every 6 (six) hours as needed (pain).    Historical Provider, MD  insulin aspart (NOVOLOG) 100 UNIT/ML injection Inject 10 Units into the skin 2 (two) times daily.    Historical Provider, MD  metFORMIN (GLUCOPHAGE) 1000 MG tablet Take 1 tablet (1,000 mg total) by mouth 2 (two) times daily with a  meal. 01/15/14   Shuvon Rankin, NP   BP 129/72  Pulse 94  Temp(Src) 98 F (36.7 C) (Oral)  Resp 16  SpO2 95% Physical Exam  Nursing note and vitals reviewed. Constitutional: He appears well-developed and well-nourished. No distress.  Awake, alert, nontoxic appearance  HENT:  Head: Normocephalic and atraumatic.  Mouth/Throat: Oropharynx is clear and moist. No oropharyngeal  exudate.  Eyes: Conjunctivae are normal. No scleral icterus.  Neck: Normal range of motion. Neck supple.  Cardiovascular: Normal rate, regular rhythm, normal heart sounds and intact distal pulses.   No murmur heard. Pulmonary/Chest: Effort normal and breath sounds normal. No respiratory distress. He has no wheezes.  Equal chest expansion Clear and equal breath sounds  Abdominal: Soft. Bowel sounds are normal. He exhibits no mass. There is no tenderness. There is no rebound and no guarding.  Musculoskeletal: Normal range of motion. He exhibits no edema.  Neurological: He is alert.  Speech is clear and goal oriented Moves extremities without ataxia  Skin: Skin is warm and dry. He is not diaphoretic.  Scabbed and well healing abrasion to the right anterior lower leg; mild erythema surrounding the site without increased warmth or induration; no purulent drainage or gross abscess  Psychiatric: He has a normal mood and affect.    ED Course  Procedures (including critical care time) Labs Review Labs Reviewed  ETHANOL - Abnormal; Notable for the following:    Alcohol, Ethyl (B) 198 (*)    All other components within normal limits  CBG MONITORING, ED - Abnormal; Notable for the following:    Glucose-Capillary 197 (*)    All other components within normal limits    Imaging Review No results found.   EKG Interpretation None      MDM   Final diagnoses:  ETOH abuse  Type 2 diabetes mellitus with diabetic neuropathy   [name] presents with alcohol intoxication.  Alcohol level CXCVIII and glucose 197. Patient in good scan emergency department without difficulty and is clinically sober.   10:25 PM Patient continues to have no somatic complaints. Will discharge home.  I have personally reviewed patient's vitals, nursing note and any pertinent labs or imaging.  I performed an focused physical exam; undressed when appropriate .    It has been determined that no acute  conditions requiring further emergency intervention are present at this time. The patient/guardian have been advised of the diagnosis and plan. I reviewed any labs and imaging including any potential incidental findings. We have discussed signs and symptoms that warrant return to the ED and they are listed in the discharge instructions.    Vital signs are stable at discharge.   BP 129/72  Pulse 94  Temp(Src) 98 F (36.7 C) (Oral)  Resp 16  SpO2 95%        Abigail Butts, PA-C 03/06/14 2225

## 2014-03-06 NOTE — ED Notes (Signed)
Bed: [facility] Expected date:  Expected time:  Means of arrival:  Comments: EMS ETOH

## 2014-03-14 ENCOUNTER — Emergency Department (HOSPITAL_COMMUNITY)
Admission: EM | Admit: 2014-03-14 | Discharge: 2014-03-14 | Disposition: A | Discharge status: Home / Self Care | Payer: Self-pay | Attending: Emergency Medicine | Admitting: Emergency Medicine

## 2014-03-14 ENCOUNTER — Encounter (HOSPITAL_COMMUNITY): Payer: Self-pay | Admitting: Emergency Medicine

## 2014-03-14 DIAGNOSIS — Z720 Tobacco use: Secondary | ICD-10-CM | POA: Insufficient documentation

## 2014-03-14 DIAGNOSIS — X58XXXA Exposure to other specified factors, initial encounter: Secondary | ICD-10-CM | POA: Insufficient documentation

## 2014-03-14 DIAGNOSIS — Z8619 Personal history of other infectious and parasitic diseases: Secondary | ICD-10-CM | POA: Insufficient documentation

## 2014-03-14 DIAGNOSIS — R509 Fever, unspecified: Secondary | ICD-10-CM | POA: Insufficient documentation

## 2014-03-14 DIAGNOSIS — Z8659 Personal history of other mental and behavioral disorders: Secondary | ICD-10-CM | POA: Insufficient documentation

## 2014-03-14 DIAGNOSIS — Z79899 Other long term (current) drug therapy: Secondary | ICD-10-CM | POA: Insufficient documentation

## 2014-03-14 DIAGNOSIS — E119 Type 2 diabetes mellitus without complications: Secondary | ICD-10-CM | POA: Insufficient documentation

## 2014-03-14 DIAGNOSIS — Z794 Long term (current) use of insulin: Secondary | ICD-10-CM | POA: Insufficient documentation

## 2014-03-14 DIAGNOSIS — Z8719 Personal history of other diseases of the digestive system: Secondary | ICD-10-CM | POA: Insufficient documentation

## 2014-03-14 DIAGNOSIS — Z862 Personal history of diseases of the blood and blood-forming organs and certain disorders involving the immune mechanism: Secondary | ICD-10-CM | POA: Insufficient documentation

## 2014-03-14 DIAGNOSIS — S81801D Unspecified open wound, right lower leg, subsequent encounter: Secondary | ICD-10-CM | POA: Insufficient documentation

## 2014-03-14 DIAGNOSIS — Z792 Long term (current) use of antibiotics: Secondary | ICD-10-CM | POA: Insufficient documentation

## 2014-03-14 MED ORDER — FENTANYL CITRATE 0.05 MG/ML IJ SOLN: 50.0000 ug | Freq: Once | INTRAMUSCULAR | Status: DC

## 2014-03-14 NOTE — ED Notes (Signed)
Has healing leg wound rt calf and wants it checked states has taken his antiobiotics

## 2014-03-14 NOTE — ED Provider Notes (Signed)
CSN: [id number]     Arrival date & time 03/14/14  1432 History  This chart was scribed for non-physician practitioner, Hyman Bible, PA-C, working with Ernestina Patches, MD, by Delphia Grates, ED Scribe. This patient was seen in room TR08C/TR08C and the patient's care was started at 3:17 PM.     Chief Complaint  Patient presents with  . Leg Pain    The history is provided by the patient. No language interpreter was used.     HPI Comments: [name] is a 58 y.o. male who presents to the Emergency Department complaining of constant right leg pain that has been ongoing for the past 3 weeks. Patient reports he suffered a wound to his right calf and would like to have it checked due his history of DM. He states he has taken 3 doses of ABX 3 times daily, but reports that since finishing the ABX, the pain returned. There is associated subjective fever. He reports he has been applying antibiotic ointment. Patient reports history of neuropathy, but denies any new numbness or tingling. He also denies drainage.  No nausea or vomiting.    Patient also notes discomfort to his left ankle due to a house arrest ankle bracelet, and is requesting a note to have this removed.   Past Medical History  Diagnosis Date  . Hepatitis C   . Diabetes mellitus   . Chronic hyponatremia     pt denies having   . Alcoholic   . Alcoholic cirrhosis of liver     Pt calls this a false diagnosis. Pt denies  . Thrombocytopenia   . Hyperglycemia   . Depression   . Anxiety    Past Surgical History  Procedure Laterality Date  . Plate in lower right leg  2008  . Fracture surgery  right lower leg plate placed years ago   Family History  Problem Relation Age of Onset  . ALS Father    History  Substance Use Topics  . Smoking status: Current Every Day Smoker -- 1.00 packs/day for 40 years    Types: Cigarettes  . Smokeless tobacco: Never Used  . Alcohol Use: Yes     Comment: 18/daily    Review of Systems   Constitutional: Positive for fever (subjective).  Musculoskeletal: Positive for myalgias (right leg).  Skin: Positive for wound (healing abrasion).  Neurological: Negative for numbness.      Allergies  Benadryl; Librium; and Sulfa antibiotics  Home Medications   Prior to Admission medications   Medication Sig Start Date End Date Taking? Authorizing Provider  clindamycin (CLEOCIN) 150 MG capsule Take 3 capsules (450 mg total) by mouth 3 (three) times daily. 02/28/14   Hannah Muthersbaugh, PA-C  ibuprofen (ADVIL,MOTRIN) 200 MG tablet Take 800 mg by mouth every 6 (six) hours as needed (pain).    Historical Provider, MD  insulin aspart (NOVOLOG) 100 UNIT/ML injection Inject 10 Units into the skin 2 (two) times daily.    Historical Provider, MD  metFORMIN (GLUCOPHAGE) 1000 MG tablet Take 1 tablet (1,000 mg total) by mouth 2 (two) times daily with a meal. 01/15/14   Shuvon Rankin, NP   Triage Vitals: BP 127/72 mmHg  Pulse 104  Temp(Src) 98.1 F (36.7 C) (Oral)  Resp 16  SpO2 97%  Physical Exam  Constitutional: He is oriented to person, place, and time. He appears well-developed and well-nourished. No distress.  HENT:  Head: Normocephalic and atraumatic.  Eyes: Conjunctivae and EOM are normal.  Neck: No tracheal deviation  present.  Cardiovascular: Normal rate, regular rhythm and normal heart sounds.   Pulmonary/Chest: Effort normal and breath sounds normal. No respiratory distress.  Musculoskeletal: Normal range of motion.  House arrest bracelet on the left ankle.  Does not appear to be too tight.  Able to fit finger in between the bracelet and leg.    Neurological: He is alert and oriented to person, place, and time.  Distal sensation of all toes of the right foot is intact.  Skin: Skin is warm and dry. There is erythema.  Scabbed and well healing abrasion to the right anterior lower leg. No erythema surrounding the site without increased warmth or induration; no purulent drainage  or gross abscess   Psychiatric: He has a normal mood and affect. His behavior is normal.  Nursing note and vitals reviewed.   ED Course  Procedures (including critical care time)  DIAGNOSTIC STUDIES: Oxygen Saturation is 97% on room air, adequate by my interpretation.    COORDINATION OF CARE: At 1524 Discussed treatment plan with patient. Patient agrees.   Labs Review Labs Reviewed - No data to display  Imaging Review No results found.   EKG Interpretation None      MDM   Final diagnoses:  None   Patient presenting with a healing wound of the right lower leg.  No surrounding erythema, edema or warmth.  No active drainage.  He in fact reports that the area is improving.  He recently completed antibiotics.  However, patient is demanding for a note stating that he can not have his house arrest bracelet on left ankle due to his DM.  Patient became agitated when he was told that he will not be given a note.  Patient stable for discharge.     Hyman Bible, PA-C 03/16/14 1326  Ernestina Patches, MD 03/16/14 2119

## 2014-09-14 ENCOUNTER — Encounter (HOSPITAL_COMMUNITY): Payer: Self-pay | Admitting: Emergency Medicine

## 2014-09-14 ENCOUNTER — Emergency Department (HOSPITAL_COMMUNITY)
Admission: EM | Admit: 2014-09-14 | Discharge: 2014-09-14 | Disposition: A | Discharge status: Home / Self Care | Payer: Self-pay | Attending: Emergency Medicine | Admitting: Emergency Medicine

## 2014-09-14 DIAGNOSIS — Z720 Tobacco use: Secondary | ICD-10-CM | POA: Insufficient documentation

## 2014-09-14 DIAGNOSIS — Z862 Personal history of diseases of the blood and blood-forming organs and certain disorders involving the immune mechanism: Secondary | ICD-10-CM | POA: Insufficient documentation

## 2014-09-14 DIAGNOSIS — Z7289 Other problems related to lifestyle: Secondary | ICD-10-CM

## 2014-09-14 DIAGNOSIS — E1165 Type 2 diabetes mellitus with hyperglycemia: Secondary | ICD-10-CM | POA: Insufficient documentation

## 2014-09-14 DIAGNOSIS — R739 Hyperglycemia, unspecified: Secondary | ICD-10-CM

## 2014-09-14 DIAGNOSIS — F1099 Alcohol use, unspecified with unspecified alcohol-induced disorder: Secondary | ICD-10-CM | POA: Insufficient documentation

## 2014-09-14 DIAGNOSIS — Z794 Long term (current) use of insulin: Secondary | ICD-10-CM | POA: Insufficient documentation

## 2014-09-14 DIAGNOSIS — Z8719 Personal history of other diseases of the digestive system: Secondary | ICD-10-CM | POA: Insufficient documentation

## 2014-09-14 DIAGNOSIS — Z8619 Personal history of other infectious and parasitic diseases: Secondary | ICD-10-CM | POA: Insufficient documentation

## 2014-09-14 DIAGNOSIS — F419 Anxiety disorder, unspecified: Secondary | ICD-10-CM | POA: Insufficient documentation

## 2014-09-14 DIAGNOSIS — Z79899 Other long term (current) drug therapy: Secondary | ICD-10-CM | POA: Insufficient documentation

## 2014-09-14 DIAGNOSIS — Z789 Other specified health status: Secondary | ICD-10-CM

## 2014-09-14 LAB — COMPREHENSIVE METABOLIC PANEL
ALK PHOS: 126 U/L (ref 38–126)
ALT: 105 U/L — AB (ref 17–63)
AST: 116 U/L — ABNORMAL HIGH (ref 15–41)
Albumin: 3.3 g/dL — ABNORMAL LOW (ref 3.5–5.0)
Anion gap: 11 (ref 5–15)
BILIRUBIN TOTAL: 0.7 mg/dL (ref 0.3–1.2)
BUN: 16 mg/dL (ref 6–20)
CHLORIDE: 91 mmol/L — AB (ref 101–111)
CO2: 23 mmol/L (ref 22–32)
Calcium: 8.2 mg/dL — ABNORMAL LOW (ref 8.9–10.3)
Creatinine, Ser: 0.65 mg/dL (ref 0.61–1.24)
GFR calc non Af Amer: >60 mL/min (ref >60)
GLUCOSE: 388 mg/dL — AB (ref 70–99)
POTASSIUM: 4 mmol/L (ref 3.5–5.1)
SODIUM: 125 mmol/L — AB (ref 135–145)
Total Protein: 6.9 g/dL (ref 6.5–8.1)

## 2014-09-14 LAB — CBC
HCT: 33.9 % — ABNORMAL LOW (ref 39.0–52.0)
Hemoglobin: 12.2 g/dL — ABNORMAL LOW (ref 13.0–17.0)
MCH: 34 pg (ref 26.0–34.0)
MCHC: 36 g/dL (ref 30.0–36.0)
MCV: 94.4 fL (ref 78.0–100.0)
PLATELETS: 93 10*3/uL — AB (ref 150–400)
RBC: 3.59 MIL/uL — ABNORMAL LOW (ref 4.22–5.81)
RDW: 13.1 % (ref 11.5–15.5)
WBC: 5.7 10*3/uL (ref 4.0–10.5)

## 2014-09-14 LAB — CBG MONITORING, ED: GLUCOSE-CAPILLARY: 403 mg/dL — AB (ref 70–99)

## 2014-09-14 LAB — URINALYSIS, ROUTINE W REFLEX MICROSCOPIC
Bilirubin Urine: NEGATIVE
Hgb urine dipstick: NEGATIVE
Ketones, ur: NEGATIVE mg/dL
Leukocytes, UA: NEGATIVE
NITRITE: NEGATIVE
PH: 5.5 (ref 5.0–8.0)
Protein, ur: NEGATIVE mg/dL
Specific Gravity, Urine: 1.011 (ref 1.005–1.030)
Urobilinogen, UA: 0.2 mg/dL (ref 0.0–1.0)

## 2014-09-14 LAB — ETHANOL: Alcohol, Ethyl (B): 182 mg/dL — ABNORMAL HIGH (ref <5)

## 2014-09-14 LAB — URINE MICROSCOPIC-ADD ON

## 2014-09-14 MED ORDER — LORAZEPAM 1 MG PO TABS: 1.0000 mg | ORAL_TABLET | Freq: Three times a day (TID) | ORAL | Status: DC | PRN

## 2014-09-14 MED ORDER — SODIUM CHLORIDE 0.9 % IV BOLUS (SEPSIS)
1000.0000 mL | Freq: Once | INTRAVENOUS | Status: AC
  Administered 2014-09-14: 1000 mL via INTRAVENOUS

## 2014-09-14 MED ORDER — VITAMIN B-1 100 MG PO TABS
100.0000 mg | ORAL_TABLET | Freq: Every day | ORAL | Status: DC
  Administered 2014-09-14: 100 mg via ORAL
  Filled 2014-09-14: qty 1

## 2014-09-14 NOTE — ED Notes (Addendum)
Per EMS, pt attempted to detox from ETOH for past 3 days without success, came to ED for ETOH detox. Per EMS pt's XNA 355, pt has not been taking his insulin. Pt states last drink was about 3 hours ago, pt states he drank 3 forty-oz beers.

## 2014-09-14 NOTE — ED Provider Notes (Signed)
CSN: [id number]     Arrival date & time 09/14/14  1857 History   First MD Initiated Contact with Patient 09/14/14 1930     Chief Complaint  Patient presents with  . ETOH Detox    . Hyperglycemia   [name] is a 59 y.o. male with history of diabetes, alcoholism abuse, alcoholic cirrhosis, hepatitis C, depression and anxiety who presents to the emergency department requesting help with detox from alcohol. The patient is also noted to have an elevated blood glucose level by EMS. The patient reports that until 2 weeks ago he had been sober. Patient reports starting to drink again 2 weeks ago. Patient reports having an average of three 40 ounce beers a day. Today he drank three 40 ounce beers. His last drink was approximately 3 hours prior to arrival. Patient reports he has been trying to detox off alcohol for the past 3 days and became very shaky today. He reports drinking alcohol and his symptoms resolved. Patient reports having diabetes and is on insulin. He reports he is only been intermittently taking his insulin because he has been more depressed. Patient reports having plenty of insulin at home. Patient reports depression but denies suicidal or homicidal ideations. Patient denies fevers, chills, recent illness, abdominal pain, nausea, vomiting, urinary symptoms, SI, HI, headache, changes to his vision.   (Consider location/radiation/quality/duration/timing/severity/associated sxs/prior Treatment) HPI  Past Medical History  Diagnosis Date  . Hepatitis C   . Diabetes mellitus   . Chronic hyponatremia     pt denies having   . Alcoholic   . Alcoholic cirrhosis of liver     Pt calls this a false diagnosis. Pt denies  . Thrombocytopenia   . Hyperglycemia   . Depression   . Anxiety    Past Surgical History  Procedure Laterality Date  . Plate in lower right leg  2008  . Fracture surgery  right lower leg plate placed years ago   Family History  Problem Relation Age of Onset  . ALS  Father    History  Substance Use Topics  . Smoking status: Current Every Day Smoker -- 1.00 packs/day for 40 years    Types: Cigarettes  . Smokeless tobacco: Never Used  . Alcohol Use: Yes     Comment: 18/daily    Review of Systems  Constitutional: Negative for fever and chills.  HENT: Negative for congestion, ear pain and sore throat.   Eyes: Negative for pain and visual disturbance.  Respiratory: Negative for cough, shortness of breath and wheezing.   Cardiovascular: Negative for chest pain and palpitations.  Gastrointestinal: Negative for nausea, vomiting, abdominal pain, diarrhea and blood in stool.  Genitourinary: Positive for frequency. Negative for dysuria, hematuria and difficulty urinating.  Musculoskeletal: Negative for back pain and neck pain.  Skin: Negative for rash.  Neurological: Negative for headaches.  Psychiatric/Behavioral: Positive for dysphoric mood. Negative for suicidal ideas and hallucinations. The patient is not nervous/anxious.       Allergies  Benadryl; Librium; and Sulfa antibiotics  Home Medications   Prior to Admission medications   Medication Sig Start Date End Date Taking? Authorizing Provider  gabapentin (NEURONTIN) 600 MG tablet Take 600 mg by mouth 3 (three) times daily.   Yes Historical Provider, MD  ibuprofen (ADVIL,MOTRIN) 200 MG tablet Take 800 mg by mouth every 6 (six) hours as needed (pain).   Yes Historical Provider, MD  insulin NPH Human (HUMULIN N,NOVOLIN N) 100 UNIT/ML injection Inject 8 Units into the skin 2 (  two) times daily.   Yes Historical Provider, MD  metFORMIN (GLUCOPHAGE) 1000 MG tablet Take 1 tablet (1,000 mg total) by mouth 2 (two) times daily with a meal. 01/15/14  Yes Shuvon B Rankin, NP  clindamycin (CLEOCIN) 150 MG capsule Take 3 capsules (450 mg total) by mouth 3 (three) times daily. Patient not taking: Reported on 09/14/2014 02/28/14   Jarrett Soho Muthersbaugh, PA-C  LORazepam (ATIVAN) 1 MG tablet Take 1 tablet (1 mg total)  by mouth 3 (three) times daily as needed. 09/14/14   Waynetta Pean, PA-C   BP 139/57 mmHg  Pulse 95  Temp(Src) 98.4 F (36.9 C) (Oral)  Resp 16  Ht 6' (1.829 m)  Wt 175 lb (79.379 kg)  BMI 23.73 kg/m2  SpO2 94% Physical Exam  Constitutional: He is oriented to person, place, and time. He appears well-developed and well-nourished. No distress.  Nontoxic appearing.  HENT:  Head: Normocephalic and atraumatic.  Mouth/Throat: Oropharynx is clear and moist. No oropharyngeal exudate.  Eyes: Conjunctivae are normal. Pupils are equal, round, and reactive to light. Right eye exhibits no discharge. Left eye exhibits no discharge.  Neck: Neck supple. No JVD present.  Cardiovascular: Normal rate, regular rhythm, normal heart sounds and intact distal pulses.  Exam reveals no gallop and no friction rub.   No murmur heard. Pulmonary/Chest: Effort normal and breath sounds normal. No respiratory distress. He has no wheezes. He has no rales.  Abdominal: Soft. He exhibits no distension. There is no tenderness. There is no guarding.  Abdomen is soft and nontender to palpation.  Musculoskeletal: He exhibits no edema.  Lymphadenopathy:    He has no cervical adenopathy.  Neurological: He is alert and oriented to person, place, and time. Coordination normal.  Patient is alert and oriented 3.  Skin: Skin is warm and dry. No rash noted. He is not diaphoretic. No erythema. No pallor.  Psychiatric: He has a normal mood and affect. His behavior is normal.  Nursing note and vitals reviewed.   ED Course  Procedures (including critical care time) Labs Review Labs Reviewed  CBC - Abnormal; Notable for the following:    RBC 3.59 (*)    Hemoglobin 12.2 (*)    HCT 33.9 (*)    Platelets 93 (*)    All other components within normal limits  COMPREHENSIVE METABOLIC PANEL - Abnormal; Notable for the following:    Sodium 125 (*)    Chloride 91 (*)    Glucose, Bld 388 (*)    Calcium 8.2 (*)    Albumin 3.3 (*)     AST 116 (*)    ALT 105 (*)    All other components within normal limits  URINALYSIS, ROUTINE W REFLEX MICROSCOPIC - Abnormal; Notable for the following:    Glucose, UA >1000 (*)    All other components within normal limits  ETHANOL - Abnormal; Notable for the following:    Alcohol, Ethyl (B) 182 (*)    All other components within normal limits  CBG MONITORING, ED - Abnormal; Notable for the following:    Glucose-Capillary 403 (*)    All other components within normal limits  URINE MICROSCOPIC-ADD ON    Imaging Review No results found.   EKG Interpretation None      Filed Vitals:   09/14/14 1902 09/14/14 2112 09/14/14 2136 09/14/14 2302  BP: 134/64 155/77 129/61 139/57  Pulse: 89 91 93 95  Temp: 98.5 F (36.9 C)  98.4 F (36.9 C) 98.4 F (36.9 C)  TempSrc:  Oral  Oral Oral  Resp: _0 Height:    6' (1.829 m)  Weight:    175 lb (79.379 kg)  SpO2: 95%  96% 94%     MDM   Meds given in ED:  Medications  thiamine (VITAMIN B-1) tablet 100 mg (100 mg Oral Given 09/14/14 2109)  sodium chloride 0.9 % bolus 1,000 mL (0 mLs Intravenous Stopped 09/14/14 2301)    Discharge Medication List as of 09/14/2014 10:44 PM    START taking these medications   Details  LORazepam (ATIVAN) 1 MG tablet Take 1 tablet (1 mg total) by mouth 3 (three) times daily as needed., Starting 09/14/2014, Until Discontinued, Print        Final diagnoses:  Alcohol use  Hyperglycemia   This is a 59 y.o. male with history of diabetes, alcoholism abuse, alcoholic cirrhosis, hepatitis C, depression and anxiety who presents to the emergency department requesting help with detox from alcohol. The patient is also noted to have an elevated blood glucose level by EMS. The patient reports that until 2 weeks ago he had been sober. Patient reports starting to drink again 2 weeks ago.  Today he drank three 40 ounce beers. Last drink was 3 hours prior to arrival. He reports feeling shaky prior to drinking today.  He admits to depression but denies SI or HI. He reports having plenty of insulin at home, but just not using it all the time. On exam the patient is afebrile and nontoxic appearing. He appears clinically sober. Is able to handle it without difficulty or assistance. He is alert and oriented 3. His alcohol level is 182. His CBC is unremarkable. His CMP shows a sodium of 125, chloride of 91 and glucose of 388. He does not have an anion gap.  He has elevated liver enzymes with an AST 116 ALT of 105, this is chronic. Urinalysis is negative for infection. Patient given fluid bolus in the ED. His CIWA score is 1. Will discharge with behavioral health resources. I am suspicious of the patient has an allergy to Librium that causes anxiety. We'll provide the patient with 4 pills of 1 mg Ativan. I advised the patient to follow-up with their primary care provider this week. I advised the patient to return to the emergency department with new or worsening symptoms or new concerns. The patient verbalized understanding and agreement with plan.    This patient was discussed with Dr. Regenia Skeeter who agrees with assessment and plan.     Waynetta Pean, PA-C 09/15/14 0865  Sherwood Gambler, MD 09/17/14 [phone]

## 2014-09-14 NOTE — ED Notes (Signed)
Nurse will pull labs from IV

## 2014-09-14 NOTE — Discharge Instructions (Signed)
Alcohol Withdrawal Anytime drug use is interfering with normal living activities it has become abuse. This includes problems with family and friends. Psychological dependence has developed when your mind tells you that the drug is needed. This is usually followed by physical dependence when a continuing increase of drugs are required to get the same feeling or "high." This is known as addiction or chemical dependency. A person's risk is much higher if there is a history of chemical dependency in the family. Mild Withdrawal Following Stopping Alcohol, When Addiction or Chemical Dependency Has Developed When a person has developed tolerance to alcohol, any sudden stopping of alcohol can cause uncomfortable physical symptoms. Most of the time these are mild and consist of tremors in the hands and increases in heart rate, breathing, and temperature. Sometimes these symptoms are associated with anxiety, panic attacks, and bad dreams. There may also be stomach upset. Normal sleep patterns are often interrupted with periods of inability to sleep (insomnia). This may last for 6 months. Because of this discomfort, many people choose to continue drinking to get rid of this discomfort and to try to feel normal. Severe Withdrawal with Decreased or No Alcohol Intake, When Addiction or Chemical Dependency Has Developed About five percent of alcoholics will develop signs of severe withdrawal when they stop using alcohol. One sign of this is development of generalized seizures (convulsions). Other signs of this are severe agitation and confusion. This may be associated with believing in things which are not real or seeing things which are not really there (delusions and hallucinations). Vitamin deficiencies are usually present if alcohol intake has been long-term. Treatment for this most often requires hospitalization and close observation. Addiction can only be helped by stopping use of all chemicals. This is hard but may  save your life. With continual alcohol use, possible outcomes are usually loss of self respect and esteem, violence, and death. Addiction cannot be cured but it can be stopped. This often requires outside help and the care of professionals. Treatment centers are listed in the yellow pages under Cocaine, Narcotics, and Alcoholics Anonymous. Most hospitals and clinics can refer you to a specialized care center. It is not necessary for you to go through the uncomfortable symptoms of withdrawal. Your caregiver can provide you with medicines that will help you through this difficult period. Try to avoid situations, friends, or drugs that made it possible for you to keep using alcohol in the past. Learn how to say no. It takes a long period of time to overcome addictions to all drugs, including alcohol. There may be many times when you feel as though you want a drink. After getting rid of the physical addiction and withdrawal, you will have a lessening of the craving which tells you that you need alcohol to feel normal. Call your caregiver if more support is needed. Learn who to talk to in your family and among your friends so that during these periods you can receive outside help. Alcoholics Anonymous (AA) has helped many people over the years. To get further help, contact AA or call your caregiver, counselor, or clergyperson. Al-Anon and Alateen are support groups for friends and family members of an alcoholic. The people who love and care for an alcoholic often need help, too. For information about these organizations, check your phone directory or call a local alcoholism treatment center.  SEEK IMMEDIATE MEDICAL CARE IF:   You have a seizure.  You have a fever.  You experience uncontrolled vomiting or you  vomit up blood. This may be bright red or look like black coffee grounds.  You have blood in the stool. This may be bright red or appear as a black, tarry, bad-smelling stool.  You become lightheaded or  faint. Do not drive if you feel this way. Have someone else drive you or call 505 for help.  You become more agitated or confused.  You develop uncontrolled anxiety.  You begin to see things that are not really there (hallucinate). Your caregiver has determined that you completely understand your medical condition, and that your mental state is back to normal. You understand that you have been treated for alcohol withdrawal, have agreed not to drink any alcohol for a minimum of 1 day, will not operate a car or other machinery for 24 hours, and have had an opportunity to ask any questions about your condition. Document Released: 02/03/2005 Document Revised: 07/19/2011 Document Reviewed: 12/13/2007 [facility] Patient Information 2015 [place], [place]. This information is not intended to replace advice given to you by your health care provider. Make sure you discuss any questions you have with your health care provider.  Alcohol Use Disorder Alcohol use disorder is a mental disorder. It is not a one-time incident of heavy drinking. Alcohol use disorder is the excessive and uncontrollable use of alcohol over time that leads to problems with functioning in one or more areas of daily living. People with this disorder risk harming themselves and others when they drink to excess. Alcohol use disorder also can cause other mental disorders, such as mood and anxiety disorders, and serious physical problems. People with alcohol use disorder often misuse other drugs.  Alcohol use disorder is common and widespread. Some people with this disorder drink alcohol to cope with or escape from negative life events. Others drink to relieve chronic pain or symptoms of mental illness. People with a family history of alcohol use disorder are at higher risk of losing control and using alcohol to excess.  SYMPTOMS  Signs and symptoms of alcohol use disorder may include the following:   Consumption ofalcohol inlarger amounts or  over a longer period of time than intended.  Multiple unsuccessful attempts to cutdown or control alcohol use.   A great deal of time spent obtaining alcohol, using alcohol, or recovering from the effects of alcohol (hangover).  A strong desire or urge to use alcohol (cravings).   Continued use of alcohol despite problems at work, school, or home because of alcohol use.   Continued use of alcohol despite problems in relationships because of alcohol use.  Continued use of alcohol in situations when it is physically hazardous, such as driving a car.  Continued use of alcohol despite awareness of a physical or psychological problem that is likely related to alcohol use. Physical problems related to alcohol use can involve the brain, heart, liver, stomach, and intestines. Psychological problems related to alcohol use include intoxication, depression, anxiety, psychosis, delirium, and dementia.   The need for increased amounts of alcohol to achieve the same desired effect, or a decreased effect from the consumption of the same amount of alcohol (tolerance).  Withdrawal symptoms upon reducing or stopping alcohol use, or alcohol use to reduce or avoid withdrawal symptoms. Withdrawal symptoms include:  Racing heart.  Hand tremor.  Difficulty sleeping.  Nausea.  Vomiting.  Hallucinations.  Restlessness.  Seizures. DIAGNOSIS Alcohol use disorder is diagnosed through an assessment by your health care provider. Your health care provider may start by asking three or four questions  to screen for excessive or problematic alcohol use. To confirm a diagnosis of alcohol use disorder, at least two symptoms must be present within a 41-monthperiod. The severity of alcohol use disorder depends on the number of symptoms:  Mild--two or three.  Moderate--four or five.  Severe--six or more. Your health care provider may perform a physical exam or use results from lab tests to see if you have  physical problems resulting from alcohol use. Your health care provider may refer you to a mental health professional for evaluation. TREATMENT  Some people with alcohol use disorder are able to reduce their alcohol use to low-risk levels. Some people with alcohol use disorder need to quit drinking alcohol. When necessary, mental health professionals with specialized training in substance use treatment can help. Your health care provider can help you decide how severe your alcohol use disorder is and what type of treatment you need. The following forms of treatment are available:   Detoxification. Detoxification involves the use of prescription medicines to prevent alcohol withdrawal symptoms in the first week after quitting. This is important for people with a history of symptoms of withdrawal and for heavy drinkers who are likely to have withdrawal symptoms. Alcohol withdrawal can be dangerous and, in severe cases, cause death. Detoxification is usually provided in a hospital or in-patient substance use treatment facility.  Counseling or talk therapy. Talk therapy is provided by substance use treatment counselors. It addresses the reasons people use alcohol and ways to keep them from drinking again. The goals of talk therapy are to help people with alcohol use disorder find healthy activities and ways to cope with life stress, to identify and avoid triggers for alcohol use, and to handle cravings, which can cause relapse.  Medicines.Different medicines can help treat alcohol use disorder through the following actions:  Decrease alcohol cravings.  Decrease the positive reward response felt from alcohol use.  Produce an uncomfortable physical reaction when alcohol is used (aversion therapy).  Support groups. Support groups are run by people who have quit drinking. They provide emotional support, advice, and guidance. These forms of treatment are often combined. Some people with alcohol use disorder  benefit from intensive combination treatment provided by specialized substance use treatment centers. Both inpatient and outpatient treatment programs are available. Document Released: 06/03/2004 Document Revised: 09/10/2013 Document Reviewed: 08/03/2012 [facility] Center[facility] Information 2015 [place] This information is not intended to replace advice given to you by your health care provider. Make sure you discuss any questions you have with your health care provider. Hyperglycemia Hyperglycemia occurs when the glucose (sugar) in your blood is too high. Hyperglycemia can happen for many reasons, but it most often happens to people who do not know they have diabetes or are not managing their diabetes properly.  CAUSES  Whether you have diabetes or not, there are other causes of hyperglycemia. Hyperglycemia can occur when you have diabetes, but it can also occur in other situations that you might not be as aware of, such as: Diabetes  If you have diabetes and are having problems controlling your blood glucose, hyperglycemia could occur because of some of the following reasons:  Not following your meal plan.  Not taking your diabetes medications or not taking it properly.  Exercising less or doing less activity than you normally do.  Being sick. Pre-diabetes  This cannot be ignored. Before people develop Type 2 diabetes, they almost always have "pre-diabetes." This is when your blood glucose levels are higher than normal,  but not yet high enough to be diagnosed as diabetes. Research has shown that some long-term damage to the body, especially the heart and circulatory system, may already be occurring during pre-diabetes. If you take action to manage your blood glucose when you have pre-diabetes, you may delay or prevent Type 2 diabetes from developing. Stress  If you have diabetes, you may be "diet" controlled or on oral medications or insulin to control your diabetes. However, you may find  that your blood glucose is higher than usual in the hospital whether you have diabetes or not. This is often referred to as "stress hyperglycemia." Stress can elevate your blood glucose. This happens because of hormones put out by the body during times of stress. If stress has been the cause of your high blood glucose, it can be followed regularly by your caregiver. That way he/she can make sure your hyperglycemia does not continue to get worse or progress to diabetes. Steroids  Steroids are medications that act on the infection fighting system (immune system) to block inflammation or infection. One side effect can be a rise in blood glucose. Most people can produce enough extra insulin to allow for this rise, but for those who cannot, steroids make blood glucose levels go even higher. It is not unusual for steroid treatments to "uncover" diabetes that is developing. It is not always possible to determine if the hyperglycemia will go away after the steroids are stopped. A special blood test called an A1c is sometimes done to determine if your blood glucose was elevated before the steroids were started. SYMPTOMS  Thirsty.  Frequent urination.  Dry mouth.  Blurred vision.  Tired or fatigue.  Weakness.  Sleepy.  Tingling in feet or leg. DIAGNOSIS  Diagnosis is made by monitoring blood glucose in one or all of the following ways:  A1c test. This is a chemical found in your blood.  Fingerstick blood glucose monitoring.  Laboratory results. TREATMENT  First, knowing the cause of the hyperglycemia is important before the hyperglycemia can be treated. Treatment may include, but is not be limited to:  Education.  Change or adjustment in medications.  Change or adjustment in meal plan.  Treatment for an illness, infection, etc.  More frequent blood glucose monitoring.  Change in exercise plan.  Decreasing or stopping steroids.  Lifestyle changes. HOME CARE INSTRUCTIONS   Test  your blood glucose as directed.  Exercise regularly. Your caregiver will give you instructions about exercise. Pre-diabetes or diabetes which comes on with stress is helped by exercising.  Eat wholesome, balanced meals. Eat often and at regular, fixed times. Your caregiver or nutritionist will give you a meal plan to guide your sugar intake.  Being at an ideal weight is important. If needed, losing as little as 10 to 15 pounds may help improve blood glucose levels. SEEK MEDICAL CARE IF:   You have questions about medicine, activity, or diet.  You continue to have symptoms (problems such as increased thirst, urination, or weight gain). SEEK IMMEDIATE MEDICAL CARE IF:   You are vomiting or have diarrhea.  Your breath smells fruity.  You are breathing faster or slower.  You are very sleepy or incoherent.  You have numbness, tingling, or pain in your feet or hands.  You have chest pain.  Your symptoms get worse even though you have been following your caregiver's orders.  If you have any other questions or concerns. Document Released: 10/20/2000 Document Revised: 07/19/2011 Document Reviewed: 08/23/2011 [employer] Patient Information 2015  [employer]. This information is not intended to replace advice given to you by your health care provider. Make sure you discuss any questions you have with your health care provider.

## 2014-10-19 ENCOUNTER — Emergency Department (HOSPITAL_COMMUNITY)
Admission: EM | Admit: 2014-10-19 | Discharge: 2014-10-19 | Discharge status: Against Medical Advice | Payer: Self-pay | Attending: Emergency Medicine | Admitting: Emergency Medicine

## 2014-10-19 ENCOUNTER — Encounter (HOSPITAL_COMMUNITY): Payer: Self-pay | Admitting: Nurse Practitioner

## 2014-10-19 DIAGNOSIS — Z79899 Other long term (current) drug therapy: Secondary | ICD-10-CM | POA: Insufficient documentation

## 2014-10-19 DIAGNOSIS — F419 Anxiety disorder, unspecified: Secondary | ICD-10-CM | POA: Insufficient documentation

## 2014-10-19 DIAGNOSIS — Z794 Long term (current) use of insulin: Secondary | ICD-10-CM | POA: Insufficient documentation

## 2014-10-19 DIAGNOSIS — Z8719 Personal history of other diseases of the digestive system: Secondary | ICD-10-CM | POA: Insufficient documentation

## 2014-10-19 DIAGNOSIS — R739 Hyperglycemia, unspecified: Secondary | ICD-10-CM

## 2014-10-19 DIAGNOSIS — E1165 Type 2 diabetes mellitus with hyperglycemia: Secondary | ICD-10-CM | POA: Insufficient documentation

## 2014-10-19 DIAGNOSIS — R110 Nausea: Secondary | ICD-10-CM | POA: Insufficient documentation

## 2014-10-19 DIAGNOSIS — Z5329 Procedure and treatment not carried out because of patient's decision for other reasons: Secondary | ICD-10-CM

## 2014-10-19 DIAGNOSIS — F329 Major depressive disorder, single episode, unspecified: Secondary | ICD-10-CM | POA: Insufficient documentation

## 2014-10-19 DIAGNOSIS — Z720 Tobacco use: Secondary | ICD-10-CM | POA: Insufficient documentation

## 2014-10-19 DIAGNOSIS — Z8619 Personal history of other infectious and parasitic diseases: Secondary | ICD-10-CM | POA: Insufficient documentation

## 2014-10-19 DIAGNOSIS — R1011 Right upper quadrant pain: Secondary | ICD-10-CM | POA: Insufficient documentation

## 2014-10-19 DIAGNOSIS — Z862 Personal history of diseases of the blood and blood-forming organs and certain disorders involving the immune mechanism: Secondary | ICD-10-CM | POA: Insufficient documentation

## 2014-10-19 DIAGNOSIS — Z5320 Procedure and treatment not carried out because of patient's decision for unspecified reasons: Secondary | ICD-10-CM

## 2014-10-19 DIAGNOSIS — R000 Tachycardia, unspecified: Secondary | ICD-10-CM | POA: Insufficient documentation

## 2014-10-19 DIAGNOSIS — F10230 Alcohol dependence with withdrawal, uncomplicated: Secondary | ICD-10-CM

## 2014-10-19 LAB — COMPREHENSIVE METABOLIC PANEL
ALBUMIN: 3.3 g/dL — AB (ref 3.5–5.0)
ALT: 124 U/L — AB (ref 17–63)
ANION GAP: 12 (ref 5–15)
AST: 142 U/L — AB (ref 15–41)
Alkaline Phosphatase: 185 U/L — ABNORMAL HIGH (ref 38–126)
BUN: 9 mg/dL (ref 6–20)
CALCIUM: 8.2 mg/dL — AB (ref 8.9–10.3)
CO2: 25 mmol/L (ref 22–32)
Chloride: 88 mmol/L — ABNORMAL LOW (ref 101–111)
Creatinine, Ser: 1.1 mg/dL (ref 0.61–1.24)
GFR calc Af Amer: >60 mL/min (ref >60)
GFR calc non Af Amer: >60 mL/min (ref >60)
GLUCOSE: 525 mg/dL — AB (ref 65–99)
Potassium: 4.1 mmol/L (ref 3.5–5.1)
Sodium: 125 mmol/L — ABNORMAL LOW (ref 135–145)
TOTAL PROTEIN: 6.8 g/dL (ref 6.5–8.1)
Total Bilirubin: 0.5 mg/dL (ref 0.3–1.2)

## 2014-10-19 LAB — SALICYLATE LEVEL

## 2014-10-19 LAB — CBC
HEMATOCRIT: 35.6 % — AB (ref 39.0–52.0)
Hemoglobin: 13 g/dL (ref 13.0–17.0)
MCH: 34.9 pg — ABNORMAL HIGH (ref 26.0–34.0)
MCHC: 36.5 g/dL — ABNORMAL HIGH (ref 30.0–36.0)
MCV: 95.7 fL (ref 78.0–100.0)
PLATELETS: 70 10*3/uL — AB (ref 150–400)
RBC: 3.72 MIL/uL — AB (ref 4.22–5.81)
RDW: 13.7 % (ref 11.5–15.5)
WBC: 3.8 10*3/uL — ABNORMAL LOW (ref 4.0–10.5)

## 2014-10-19 LAB — CBG MONITORING, ED: Glucose-Capillary: 459 mg/dL — ABNORMAL HIGH (ref 65–99)

## 2014-10-19 LAB — ACETAMINOPHEN LEVEL: Acetaminophen (Tylenol), Serum: <10 ug/mL — ABNORMAL LOW (ref 10–30)

## 2014-10-19 LAB — ETHANOL: Alcohol, Ethyl (B): 288 mg/dL — ABNORMAL HIGH (ref <5)

## 2014-10-19 MED ORDER — SODIUM CHLORIDE 0.9 % IV BOLUS (SEPSIS)
1000.0000 mL | Freq: Once | INTRAVENOUS | Status: AC
  Administered 2014-10-19: 1000 mL via INTRAVENOUS

## 2014-10-19 MED ORDER — LORAZEPAM 1 MG PO TABS: 1.0000 mg | ORAL_TABLET | Freq: Three times a day (TID) | ORAL | Status: DC | PRN

## 2014-10-19 NOTE — ED Notes (Signed)
Pt states that he wants to leave AMA. Pt wanted a prescription for ativan and Dr Audie Pinto agreed to do that. Dr Audie Pinto stated that the pt must sign out AMA due to his high sugar level. Pt given options of staying and treating sugar level and pt stated he wanted to leave. Pt signed AMA form and in NAD at time of discharge.

## 2014-10-19 NOTE — Discharge Instructions (Signed)
Alcohol Withdrawal Alcohol withdrawal happens when you normally drink alcohol a lot and suddenly stop drinking. Alcohol withdrawal symptoms can be mild to very bad. Mild withdrawal symptoms can include feeling sick to your stomach (nauseous), headache, or feeling irritable. Bad withdrawal symptoms can include shakiness, being very nervous (anxious), and not thinking clearly.  HOME CARE  Join an alcohol support group.  Stay away from people or situations that make you want to drink.  Eat a healthy diet. Eat a lot of fresh fruits, vegetables, and lean meats. GET HELP RIGHT AWAY IF:   You become confused. You start to see and hear things that are not really there.  You feel your heart beating very fast.  You throw up (vomit) blood or cannot stop throwing up. This may be bright red or look like black coffee grounds.  You have blood in your poop (stool). This may be bright red, maroon colored, or black and tarry.  You are lightheaded or pass out (faint).  You develop a fever. MAKE SURE YOU:   Understand these instructions.  Will watch your condition.  Will get help right away if you are not doing well or get worse. Document Released: 10/13/2007 Document Revised: 07/19/2011 Document Reviewed: 10/13/2007 [facility] Patient Information 2015 [place], [place]. This information is not intended to replace advice given to you by your health care provider. Make sure you discuss any questions you have with your health care provider.  Alcohol Use Disorder Alcohol use disorder is a mental disorder. It is not a one-time incident of heavy drinking. Alcohol use disorder is the excessive and uncontrollable use of alcohol over time that leads to problems with functioning in one or more areas of daily living. People with this disorder risk harming themselves and others when they drink to excess. Alcohol use disorder also can cause other mental disorders, such as mood and anxiety disorders, and serious  physical problems. People with alcohol use disorder often misuse other drugs.  Alcohol use disorder is common and widespread. Some people with this disorder drink alcohol to cope with or escape from negative life events. Others drink to relieve chronic pain or symptoms of mental illness. People with a family history of alcohol use disorder are at higher risk of losing control and using alcohol to excess.  SYMPTOMS  Signs and symptoms of alcohol use disorder may include the following:   Consumption ofalcohol inlarger amounts or over a longer period of time than intended.  Multiple unsuccessful attempts to cutdown or control alcohol use.   A great deal of time spent obtaining alcohol, using alcohol, or recovering from the effects of alcohol (hangover).  A strong desire or urge to use alcohol (cravings).   Continued use of alcohol despite problems at work, school, or home because of alcohol use.   Continued use of alcohol despite problems in relationships because of alcohol use.  Continued use of alcohol in situations when it is physically hazardous, such as driving a car.  Continued use of alcohol despite awareness of a physical or psychological problem that is likely related to alcohol use. Physical problems related to alcohol use can involve the brain, heart, liver, stomach, and intestines. Psychological problems related to alcohol use include intoxication, depression, anxiety, psychosis, delirium, and dementia.   The need for increased amounts of alcohol to achieve the same desired effect, or a decreased effect from the consumption of the same amount of alcohol (tolerance).  Withdrawal symptoms upon reducing or stopping alcohol use, or alcohol use  to reduce or avoid withdrawal symptoms. Withdrawal symptoms include:  Racing heart.  Hand tremor.  Difficulty sleeping.  Nausea.  Vomiting.  Hallucinations.  Restlessness.  Seizures. DIAGNOSIS Alcohol use disorder is  diagnosed through an assessment by your health care provider. Your health care provider may start by asking three or four questions to screen for excessive or problematic alcohol use. To confirm a diagnosis of alcohol use disorder, at least two symptoms must be present within a 59-monthperiod. The severity of alcohol use disorder depends on the number of symptoms:  Mild--two or three.  Moderate--four or five.  Severe--six or more. Your health care provider may perform a physical exam or use results from lab tests to see if you have physical problems resulting from alcohol use. Your health care provider may refer you to a mental health professional for evaluation. TREATMENT  Some people with alcohol use disorder are able to reduce their alcohol use to low-risk levels. Some people with alcohol use disorder need to quit drinking alcohol. When necessary, mental health professionals with specialized training in substance use treatment can help. Your health care provider can help you decide how severe your alcohol use disorder is and what type of treatment you need. The following forms of treatment are available:   Detoxification. Detoxification involves the use of prescription medicines to prevent alcohol withdrawal symptoms in the first week after quitting. This is important for people with a history of symptoms of withdrawal and for heavy drinkers who are likely to have withdrawal symptoms. Alcohol withdrawal can be dangerous and, in severe cases, cause death. Detoxification is usually provided in a hospital or in-patient substance use treatment facility.  Counseling or talk therapy. Talk therapy is provided by substance use treatment counselors. It addresses the reasons people use alcohol and ways to keep them from drinking again. The goals of talk therapy are to help people with alcohol use disorder find healthy activities and ways to cope with life stress, to identify and avoid triggers for alcohol  use, and to handle cravings, which can cause relapse.  Medicines.Different medicines can help treat alcohol use disorder through the following actions:  Decrease alcohol cravings.  Decrease the positive reward response felt from alcohol use.  Produce an uncomfortable physical reaction when alcohol is used (aversion therapy).  Support groups. Support groups are run by people who have quit drinking. They provide emotional support, advice, and guidance. These forms of treatment are often combined. Some people with alcohol use disorder benefit from intensive combination treatment provided by specialized substance use treatment centers. Both inpatient and outpatient treatment programs are available. Document Released: 06/03/2004 Document Revised: 09/10/2013 Document Reviewed: 08/03/2012 [facility] Information 2015 [place] This information is not intended to replace advice given to you by your health care provider. Make sure you discuss any questions you have with your health care provider.    Emergency Department Resource Guide 1) Find a Doctor and Pay Out of Pocket Although you won't have to find out who is covered by your insurance plan, it is a good idea to ask around and get recommendations. You will then need to call the office and see if the doctor you have chosen will accept you as a new patient and what types of options they offer for patients who are self-pay. Some doctors offer discounts or will set up payment plans for their patients who do not have insurance, but you will need to ask so you aren't surprised when you get to your  appointment.  2) Contact Your Local Health Department Not all health departments have doctors that can see patients for sick visits, but many do, so it is worth a call to see if yours does. If you don't know where your local health department is, you can check in your phone book. The CDC also has a tool to help you locate your state's health  department, and many state websites also have listings of all of their local health departments.  3) Find a [facility] If your illness is not likely to be very severe or complicated, you may want to try a walk in clinic. These are popping up all over the country in pharmacies, drugstores, and shopping centers. They're usually staffed by nurse practitioners or physician assistants that have been trained to treat common illnesses and complaints. They're usually fairly quick and inexpensive. However, if you have serious medical issues or chronic medical problems, these are probably not your best option.  No Primary Care Doctor: - Call Health Connect at  [phone] - they can help you locate a primary care doctor that  accepts your insurance, provides certain services, etc. - Physician Referral Service- [phone]  Chronic Pain Problems: Organization         Address  Phone   Notes  [facility]  [phone] Patients need to be referred by their primary care doctor.   Medication Assistance: Organization         Address  Phone   Notes  [facility] Medication [facility] [address] [place], [place] [address] [phone] --Must be a resident of [facility] -- Must have NO insurance coverage whatsoever (no Medicaid/ Medicare, etc.) -- The pt. MUST have a primary care doctor that directs their care regularly and follows them in the community   MedAssist  [phone]   [employer]  [phone]    Agencies that provide inexpensive medical care: Organization         Address  Phone   Notes  [place]  [phone]   [facility]    [phone]   [facility] [place], [place] [address] [phone]   [place] [address] [place] [phone]   Planned Parenthood    [phone]   [facility]    [phone]    [place] and [place] [address], [place] Phone:  [phone], Fax:  [phone] Hours of Operation:  9 am - 6 pm, M-F.  Also accepts Medicaid/Medicare and self-pay.  [facility] for [place] [address], [address], [place] Phone: [phone], Fax: [phone]. Hours of Operation:  8:30 am - 5:30 pm, M-F.  Also accepts Medicaid and self-pay.  [facility] [place] [address], [place] Phone: [phone]   [place], [place], [place] [phone], Ext. [phone] Mondays & Thursdays: 7-9 AM.  First 15 patients are seen on a first come, first serve basis.    [place] Providers:  Organization         Address  Phone   Notes  [facility] [address], [place] [phone] Also accepts self-pay patients.  [place], [place]  [phone]   [facility] [address], [place] [phone]   [place] [address] [place] [phone]   Lucianne Lei [address], [place]   [phone] Only accepts [place] Access [place] patients after they have their name applied to their card.   Self-Pay (no insurance) in [facility]:  Organization         Address  Phone   Notes  Sickle Cell Patients, [facility] [place] [phone]   [facility] [place] [phone]   [facility] [place]  [place], [place], [place] [phone]   [facility]/Dr. Osei-Bonsu  [address] [place] or [address], [place] [phone] Phone number for both [place] and [place] locations is the same.  Urgent Medical and [facility] [address], [place] [phone]    [facility] [address] [place] or [address] [phone]   [facility] [address], [place] [phone], phone; [phone], fax Sees patients 1st and 3rd Saturday of every month.  Must not qualify for public or private insurance (i.e. Medicaid, Medicare, [place] Health Choice, Veterans' Benefits)  Household income should be no more than 200% of the poverty level The clinic cannot treat you if you are pregnant or think you are pregnant  Sexually transmitted diseases are not treated at the clinic.    Dental Care: Organization         Address  Phone  Notes  [facility] of [facility] [phone] Accepts children up to age 41 who are enrolled in [place] or [place]; pregnant women with a Medicaid card; and children who have applied for Medicaid or [place] Health Choice, but were declined, whose parents can pay a reduced fee at time of service.  [facility] Department of [facility]  [address], [place] [phone] Accepts children up to age 75 who are enrolled in [place] or [place]; pregnant women with a Medicaid card; and children who have applied for Medicaid or [place] Health Choice, but were declined, whose parents can pay a reduced fee at time of service.  [place] Adult Dental Access PROGRAM  [place] [phone] Patients are seen by appointment only. Walk-ins are not accepted. [place] will see patients 43 years of age and older. Monday - Tuesday (8am-5pm) Most Wednesdays (8:30-5pm) $30 per visit, cash only  [facility] Adult Dental Access PROGRAM  [address], [facility] [phone] Patients are seen by appointment only. Walk-ins are not accepted. [place] will see patients 37 years of age and older. One Wednesday Evening (Monthly: Volunteer Based).   $30 per visit, cash only  [place]  [phone] for adults; Children under age 3, call Graduate Pediatric Dentistry at [phone]. Children aged 21-14, please call [phone] to request a pediatric application.  Dental services are provided in all areas of dental care including fillings, crowns and bridges, complete and partial dentures, implants, gum treatment, root canals, and extractions. Preventive care is also provided. Treatment is provided to both adults and children. Patients are selected via a lottery and there is often a waiting list.   [facility] [address], [place]  [phone] [email or web address]   [facility] [address] [place], [place] [phone], Ext. [phone] Second and Fourth Thursday of each month, opens at 6:30 AM; Clinic ends at 9 AM.  Patients are seen on a first-come first-served basis, and a limited number are seen during each clinic.   [facility]  [address] Hillard Danker [place], [place] [phone]   Eligibility Requirements You must have lived in [place], [place], or [place] counties for at least the last three months.   You cannot be eligible for state or federal sponsored [employer], including [employer], [place], or [employer].   You generally cannot be eligible for healthcare insurance through your employer.    How to apply: Eligibility screenings are held every Tuesday and Wednesday afternoon from 1:00 pm until 4:00 pm. You do not need an appointment for the interview!  [facility] [address] [place], [place]   [place]  [place] Department  [place]  [phone]    Behavioral Health Resources in the Community: Intensive Outpatient Programs Organization         Address  Phone  Notes  [facility] [place]. [address] [place], [place] [phone]   [facility] Outpatient [address] [place], [place]   ADS: Alcohol & Drug Svcs [address], [place], [place]   [place] [address],  [place], [place] or [phone]   Substance Abuse Resources Organization         Address  Phone  Notes  Alcohol and Drug Services  [phone]   [place]  [phone]   [facility] [place]   Chinita Pester  [phone]   Residential & Outpatient Substance Abuse Program  [phone]   Psychological Services Organization         Address  Phone  Notes  [facility] [place]  [place]  [phone]   [place] [address], [place] or [phone]    Mobile Crisis Teams Organization         Address  Phone  Notes  [facility], Mobile Crisis Care Unit  [phone]   [facility]  [address] [place], [place]   [place] [address], [place] [phone]    Self-Help/Support Groups Organization         Address  Phone             Notes  [place]. of [place] - variety of support groups  [place] Call for more information  Narcotics Anonymous (NA), [facility] [address], [employer] [place]  2 meetings at this location   Special educational needs teacher         Address  Phone  Notes  [facility] [place],    [place]  [phone]   [facility]  [address] [place] [phone], [place], [place]   [place], [place] [phone] Admissions: 8am-3pm M-F  Incentives Substance [place] [address]    [place], [place] [phone]   [facility] [address] [place], [place], [place]   [facility] [address],  [place], [place]  Insight Programs - Intensive Outpatient [address], [place], [place]   [facility] ([place] [address].,  [place], [place] [phone] or [phone]   [facility] Services (RTS) [address] [place], [place] Accepts Medicaid  [facility] [place] [address]  [place] [phone] Substance Abuse/Addiction Treatment   [facility] Organization         Address  Phone  Notes  [facility]  [phone]   Domenic Schwab, PhD [address] Arlis [place], [place]   [phone] or [phone]   [place], [place] [phone]   [place] [address], [place], [place] [phone] Insurance/Medicaid/sponsorship through [facility] and Families [address] [place]                                    [place], [place] [phone] [place] [address], [place] [phone]    Dr. Adele Schilder  [phone]   [facility] Dept. 1) [address], [place] 2) [place] 3)  [place] [address], [place] [phone]  [phone]   [place] [phone] or [phone] (After Hours)

## 2014-10-19 NOTE — ED Notes (Signed)
This RN went in to go see pt. And pt. Was no where to be found. This RN is discharging pt. [name]

## 2014-10-19 NOTE — ED Notes (Addendum)
Pt states he is here for detox and for blood sugar problems. He reports drinking about 4-40 oz beers daily. Last drink today. Denies other substance use. Denies SI/HI. He reports he has not taken any of his diabetes medications x 3 weeks and he is concerned about his blood sugar, he has not checked it. He denies any physical complaints. He is A&Ox4.

## 2014-10-19 NOTE — ED Provider Notes (Signed)
CSN: [id number]     Arrival date & time 10/19/14  1659 History   First MD Initiated Contact with Patient 10/19/14 1726     Chief Complaint  Patient presents with  . Alcohol Problem  . Blood Sugar Problem     (Consider location/radiation/quality/duration/timing/severity/associated sxs/prior Treatment) Patient is a 59 y.o. male presenting with alcohol problem. The history is provided by the patient.  Alcohol Problem This is a chronic problem. The current episode started more than 1 year ago. The problem occurs constantly. The problem has been unchanged. Associated symptoms include nausea. Pertinent negatives include no abdominal pain, chest pain, coughing, diaphoresis, fever, rash or vomiting. The symptoms are aggravated by drinking. He has tried nothing for the symptoms.    [name] is a 59 yo M with PMH of DM2, hep c, cirrhosis, alcoholism, depression and anxiety. Presents with wanting help to detox.  He presented to the ED on May 7 for a similar request. He is requesting ativan in order to help with his withdrawal symptoms over the course of the next three days. He says that he is motivated to quit now.  He has cramping in his legs when he doesn't drink. He drinks 4 40 oz beers each night.  He has drank 2 40 oz beers this morning.  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  The withdrawal symptoms interfere with his working.  He has stopped drinking alcohol before. He started drinking again since his release from jail, about 2 months ago.  He hasn't taken any of his diabetic medication in over a month. He denies any vomiting, constipation, diarrhea, fevers, chills, or shakes today. He has had nausea.    Past Medical History  Diagnosis Date  . Hepatitis C   . Diabetes mellitus   . Chronic hyponatremia     pt denies having   . Alcoholic   . Alcoholic cirrhosis of liver     Pt calls this a false diagnosis. Pt denies  . Thrombocytopenia   . Hyperglycemia   .  Depression   . Anxiety    Past Surgical History  Procedure Laterality Date  . Plate in lower right leg  2008  . Fracture surgery  right lower leg plate placed years ago   Family History  Problem Relation Age of Onset  . ALS Father    History  Substance Use Topics  . Smoking status: Current Every Day Smoker -- 1.00 packs/day for 40 years    Types: Cigarettes  . Smokeless tobacco: Never Used  . Alcohol Use: Yes     Comment: 18/daily    Review of Systems  Constitutional: Negative for fever and diaphoresis.  Respiratory: Negative for cough and shortness of breath.   Cardiovascular: Negative for chest pain.  Gastrointestinal: Positive for nausea. Negative for vomiting, abdominal pain, diarrhea and constipation.  Skin: Negative for color change and rash.      Allergies  Benadryl; Librium; and Sulfa antibiotics  Home Medications   Prior to Admission medications   Medication Sig Start Date End Date Taking? Authorizing Provider  clindamycin (CLEOCIN) 150 MG capsule Take 3 capsules (450 mg total) by mouth 3 (three) times daily. Patient not taking: Reported on 09/14/2014 02/28/14   Jarrett Soho Muthersbaugh, PA-C  gabapentin (NEURONTIN) 600 MG tablet Take 600 mg by mouth 3 (three) times daily.    Historical Provider, MD  insulin NPH Human (HUMULIN N,NOVOLIN N) 100 UNIT/ML injection Inject 8 Units into the skin  2 (two) times daily.    Historical Provider, MD  LORazepam (ATIVAN) 1 MG tablet Take 1 tablet (1 mg total) by mouth 3 (three) times daily as needed. 10/19/14   Leonard Schwartz, MD  metFORMIN (GLUCOPHAGE) 1000 MG tablet Take 1 tablet (1,000 mg total) by mouth 2 (two) times daily with a meal. Patient not taking: Reported on 10/19/2014 01/15/14   Shuvon B Rankin, NP   BP 123/59 mmHg  Pulse 98  Temp(Src) 98 F (36.7 C) (Oral)  Resp 16  Ht 6' (1.829 m)  Wt 171 lb (77.565 kg)  BMI 23.19 kg/m2  SpO2 98% Physical Exam  Constitutional: He is oriented to person, place, and time. He appears  well-developed and well-nourished. No distress.  HENT:  Head: Normocephalic and atraumatic.  Eyes: Conjunctivae and EOM are normal. No scleral icterus.  Neck: Neck supple.  Cardiovascular: Regular rhythm, normal heart sounds and intact distal pulses.  Tachycardia present.   No murmur heard. Pulmonary/Chest: Effort normal and breath sounds normal. He has no wheezes. He has no rales.  Abdominal: Soft. Bowel sounds are normal. He exhibits no distension. There is tenderness in the right upper quadrant. There is no rebound and no guarding.  Musculoskeletal: Normal range of motion. He exhibits no edema or tenderness.  Neurological: He is alert and oriented to person, place, and time.  Skin: Skin is warm. No rash noted.    ED Course  Procedures (including critical care time) Labs Review Labs Reviewed  ACETAMINOPHEN LEVEL - Abnormal; Notable for the following:    Acetaminophen (Tylenol), Serum <10 (*)    All other components within normal limits  CBC - Abnormal; Notable for the following:    WBC 3.8 (*)    RBC 3.72 (*)    HCT 35.6 (*)    MCH 34.9 (*)    MCHC 36.5 (*)    All other components within normal limits  COMPREHENSIVE METABOLIC PANEL - Abnormal; Notable for the following:    Sodium 125 (*)    Chloride 88 (*)    Glucose, Bld 525 (*)    Calcium 8.2 (*)    Albumin 3.3 (*)    AST 142 (*)    ALT 124 (*)    Alkaline Phosphatase 185 (*)    All other components within normal limits  ETHANOL - Abnormal; Notable for the following:    Alcohol, Ethyl (B) 288 (*)    All other components within normal limits  CBG MONITORING, ED - Abnormal; Notable for the following:    Glucose-Capillary 459 (*)    All other components within normal limits  SALICYLATE LEVEL    Imaging Review No results found.   EKG Interpretation None      Medications  sodium chloride 0.9 % bolus 1,000 mL (0 mLs Intravenous Stopped 10/19/14 1930)   MDM   Final diagnoses:  Alcohol dependence with  uncomplicated withdrawal  Hyperglycemia  Left against medical advice   [name] is requesting help to detox off alcohol.  He has drank two 40 oz beers this morning. He hasn't taken any diabetic medication in over a month.  He denies any SI or HI.  Hyperglycemic, hyponatremia and worsening of his liver enzymes. Patient is requesting to leave and signed out AMA.    Rosemarie Ax, MD PGY-2, [facility] 10/19/2014, 7:51 PM    Rosemarie Ax, MD 10/19/14 [name]  Leonard Schwartz, MD 10/20/14 [phone]

## 2014-10-19 NOTE — ED Notes (Signed)
Pt called out, walked into pt's room and pt had pulled his IV out and stated "I took it out because the fluid bag was done" IV is out and site is clean dry and in tact.

## 2014-11-06 ENCOUNTER — Emergency Department (HOSPITAL_COMMUNITY)
Admission: EM | Admit: 2014-11-06 | Discharge: 2014-11-06 | Disposition: A | Discharge status: Home / Self Care | Payer: Self-pay | Attending: Emergency Medicine | Admitting: Emergency Medicine

## 2014-11-06 ENCOUNTER — Encounter (HOSPITAL_COMMUNITY): Payer: Self-pay | Admitting: *Deleted

## 2014-11-06 ENCOUNTER — Emergency Department (HOSPITAL_COMMUNITY): Payer: Self-pay

## 2014-11-06 DIAGNOSIS — E119 Type 2 diabetes mellitus without complications: Secondary | ICD-10-CM | POA: Insufficient documentation

## 2014-11-06 DIAGNOSIS — F10920 Alcohol use, unspecified with intoxication, uncomplicated: Secondary | ICD-10-CM

## 2014-11-06 DIAGNOSIS — F329 Major depressive disorder, single episode, unspecified: Secondary | ICD-10-CM | POA: Insufficient documentation

## 2014-11-06 DIAGNOSIS — Z862 Personal history of diseases of the blood and blood-forming organs and certain disorders involving the immune mechanism: Secondary | ICD-10-CM | POA: Insufficient documentation

## 2014-11-06 DIAGNOSIS — F419 Anxiety disorder, unspecified: Secondary | ICD-10-CM | POA: Insufficient documentation

## 2014-11-06 DIAGNOSIS — Z794 Long term (current) use of insulin: Secondary | ICD-10-CM | POA: Insufficient documentation

## 2014-11-06 DIAGNOSIS — Z720 Tobacco use: Secondary | ICD-10-CM | POA: Insufficient documentation

## 2014-11-06 DIAGNOSIS — Z79899 Other long term (current) drug therapy: Secondary | ICD-10-CM | POA: Insufficient documentation

## 2014-11-06 DIAGNOSIS — F10120 Alcohol abuse with intoxication, uncomplicated: Secondary | ICD-10-CM | POA: Insufficient documentation

## 2014-11-06 DIAGNOSIS — R0789 Other chest pain: Secondary | ICD-10-CM | POA: Insufficient documentation

## 2014-11-06 DIAGNOSIS — R079 Chest pain, unspecified: Secondary | ICD-10-CM

## 2014-11-06 DIAGNOSIS — Z8619 Personal history of other infectious and parasitic diseases: Secondary | ICD-10-CM | POA: Insufficient documentation

## 2014-11-06 LAB — URINALYSIS, ROUTINE W REFLEX MICROSCOPIC
BILIRUBIN URINE: NEGATIVE
HGB URINE DIPSTICK: NEGATIVE
Ketones, ur: NEGATIVE mg/dL
Leukocytes, UA: NEGATIVE
Nitrite: NEGATIVE
PROTEIN: NEGATIVE mg/dL
Specific Gravity, Urine: 1.025 (ref 1.005–1.030)
Urobilinogen, UA: 0.2 mg/dL (ref 0.0–1.0)
pH: 5.5 (ref 5.0–8.0)

## 2014-11-06 LAB — COMPREHENSIVE METABOLIC PANEL
ALBUMIN: 2.9 g/dL — AB (ref 3.5–5.0)
ALT: 94 U/L — AB (ref 17–63)
ANION GAP: 11 (ref 5–15)
AST: 123 U/L — ABNORMAL HIGH (ref 15–41)
Alkaline Phosphatase: 139 U/L — ABNORMAL HIGH (ref 38–126)
BUN: 16 mg/dL (ref 6–20)
CALCIUM: 8.5 mg/dL — AB (ref 8.9–10.3)
CHLORIDE: 92 mmol/L — AB (ref 101–111)
CO2: 25 mmol/L (ref 22–32)
CREATININE: 1.01 mg/dL (ref 0.61–1.24)
GFR calc non Af Amer: >60 mL/min (ref >60)
Glucose, Bld: 436 mg/dL — ABNORMAL HIGH (ref 65–99)
Potassium: 4.6 mmol/L (ref 3.5–5.1)
Sodium: 128 mmol/L — ABNORMAL LOW (ref 135–145)
Total Bilirubin: 0.6 mg/dL (ref 0.3–1.2)
Total Protein: 6.3 g/dL — ABNORMAL LOW (ref 6.5–8.1)

## 2014-11-06 LAB — CBC WITH DIFFERENTIAL/PLATELET
BASOS PCT: 1 % (ref 0–1)
Basophils Absolute: 0 10*3/uL (ref 0.0–0.1)
Eosinophils Absolute: 0.1 10*3/uL (ref 0.0–0.7)
Eosinophils Relative: 1 % (ref 0–5)
HCT: 35.3 % — ABNORMAL LOW (ref 39.0–52.0)
Hemoglobin: 12.8 g/dL — ABNORMAL LOW (ref 13.0–17.0)
Lymphocytes Relative: 38 % (ref 12–46)
Lymphs Abs: 1.9 10*3/uL (ref 0.7–4.0)
MCH: 34.9 pg — ABNORMAL HIGH (ref 26.0–34.0)
MCHC: 36.3 g/dL — AB (ref 30.0–36.0)
MCV: 96.2 fL (ref 78.0–100.0)
MONO ABS: 0.4 10*3/uL (ref 0.1–1.0)
Monocytes Relative: 8 % (ref 3–12)
NEUTROS ABS: 2.7 10*3/uL (ref 1.7–7.7)
NEUTROS PCT: 53 % (ref 43–77)
PLATELETS: 114 10*3/uL — AB (ref 150–400)
RBC: 3.67 MIL/uL — ABNORMAL LOW (ref 4.22–5.81)
RDW: 12.9 % (ref 11.5–15.5)
WBC: 5.1 10*3/uL (ref 4.0–10.5)

## 2014-11-06 LAB — TROPONIN I: Troponin I: <0.03 ng/mL (ref <0.031)

## 2014-11-06 LAB — URINE MICROSCOPIC-ADD ON

## 2014-11-06 LAB — BETA-HYDROXYBUTYRIC ACID: Beta-Hydroxybutyric Acid: 0.22 mmol/L (ref 0.05–0.27)

## 2014-11-06 LAB — I-STAT CG4 LACTIC ACID, ED: Lactic Acid, Venous: 2.72 mmol/L (ref 0.5–2.0)

## 2014-11-06 LAB — LIPASE, BLOOD: Lipase: 36 U/L (ref 22–51)

## 2014-11-06 MED ORDER — INSULIN ASPART 100 UNIT/ML ~~LOC~~ SOLN
15.0000 [IU] | Freq: Once | SUBCUTANEOUS | Status: AC
  Administered 2014-11-06: 15 [IU] via SUBCUTANEOUS
  Filled 2014-11-06: qty 1

## 2014-11-06 MED ORDER — CHLORDIAZEPOXIDE HCL 25 MG PO CAPS: ORAL_CAPSULE | ORAL | Status: DC

## 2014-11-06 MED ORDER — CARBAMAZEPINE 200 MG PO TABS: ORAL_TABLET | ORAL | Status: DC

## 2014-11-06 NOTE — Discharge Instructions (Signed)
Alcohol Intoxication Alcohol intoxication occurs when the amount of alcohol that a person has consumed impairs his or her ability to mentally and physically function. Alcohol directly impairs the normal chemical activity of the brain. Drinking large amounts of alcohol can lead to changes in mental function and behavior, and it can cause many physical effects that can be harmful.  Alcohol intoxication can range in severity from mild to very severe. Various factors can affect the level of intoxication that occurs, such as the person's age, gender, weight, frequency of alcohol consumption, and the presence of other medical conditions (such as diabetes, seizures, or heart conditions). Dangerous levels of alcohol intoxication may occur when people drink large amounts of alcohol in a short period (binge drinking). Alcohol can also be especially dangerous when combined with certain prescription medicines or "recreational" drugs. SIGNS AND SYMPTOMS Some common signs and symptoms of mild alcohol intoxication include:  Loss of coordination.  Changes in mood and behavior.  Impaired judgment.  Slurred speech. As alcohol intoxication progresses to more severe levels, other signs and symptoms will appear. These may include:  Vomiting.  Confusion and impaired memory.  Slowed breathing.  Seizures.  Loss of consciousness. DIAGNOSIS  Your health care provider will take a medical history and perform a physical exam. You will be asked about the amount and type of alcohol you have consumed. Blood tests will be done to measure the concentration of alcohol in your blood. In many places, your blood alcohol level must be lower than 80 mg/dL (0.08%) to legally drive. However, many dangerous effects of alcohol can occur at much lower levels.  TREATMENT  People with alcohol intoxication often do not require treatment. Most of the effects of alcohol intoxication are temporary, and they go away as the alcohol naturally  leaves the body. Your health care provider will monitor your condition until you are stable enough to go home. Fluids are sometimes given through an IV access tube to help prevent dehydration.  HOME CARE INSTRUCTIONS  Do not drive after drinking alcohol.  Stay hydrated. Drink enough water and fluids to keep your urine clear or pale yellow. Avoid caffeine.   Only take over-the-counter or prescription medicines as directed by your health care provider.  SEEK MEDICAL CARE IF:   You have persistent vomiting.   You do not feel better after a few days.  You have frequent alcohol intoxication. Your health care provider can help determine if you should see a substance use treatment counselor. SEEK IMMEDIATE MEDICAL CARE IF:   You become shaky or tremble when you try to stop drinking.   You shake uncontrollably (seizure).   You throw up (vomit) blood. This may be bright red or may look like black coffee grounds.   You have blood in your stool. This may be bright red or may appear as a black, tarry, bad smelling stool.   You become lightheaded or faint.  MAKE SURE YOU:   Understand these instructions.  Will watch your condition.  Will get help right away if you are not doing well or get worse. Document Released: 02/03/2005 Document Revised: 12/27/2012 Document Reviewed: 09/29/2012 [facility] Patient Information 2015 [place], [place]. This information is not intended to replace advice given to you by your health care provider. Make sure you discuss any questions you have with your health care provider.  Chest Pain (Nonspecific) It is often hard to give a diagnosis for the cause of chest pain. There is always a chance that your pain could  be related to something serious, such as a heart attack or a blood clot in the lungs. You need to follow up with your doctor. HOME CARE  If antibiotic medicine was given, take it as directed by your doctor. Finish the medicine even if you start to  feel better.  For the next few days, avoid activities that bring on chest pain. Continue physical activities as told by your doctor.  Do not use any tobacco products. This includes cigarettes, chewing tobacco, and e-cigarettes.  Avoid drinking alcohol.  Only take medicine as told by your doctor.  Follow your doctor's suggestions for more testing if your chest pain does not go away.  Keep all doctor visits you made. GET HELP IF:  Your chest pain does not go away, even after treatment.  You have a rash with blisters on your chest.  You have a fever. GET HELP RIGHT AWAY IF:   You have more pain or pain that spreads to your arm, neck, jaw, back, or belly (abdomen).  You have shortness of breath.  You cough more than usual or cough up blood.  You have very bad back or belly pain.  You feel sick to your stomach (nauseous) or throw up (vomit).  You have very bad weakness.  You pass out (faint).  You have chills. This is an emergency. Do not wait to see if the problems will go away. Call your local emergency services (911 in [place] Do not drive yourself to the hospital. MAKE SURE YOU:   Understand these instructions.  Will watch your condition.  Will get help right away if you are not doing well or get worse. Document Released: 10/13/2007 Document Revised: 05/01/2013 Document Reviewed: 10/13/2007 [facility] Patient Information 2015 [place], [place]. This information is not intended to replace advice given to you by your health care provider. Make sure you discuss any questions you have with your health care provider.  Substance Abuse Treatment Programs  Intensive Outpatient Programs [facility]     [address], [place]       [facility] [place] [address] [place], [place]  [facility]     (Inpatient and outpatient)     [address]  Dr.           [place] [phone] (Suboxone and Methadone)  [place], [place] [address]      [place] [address] [place], [place]  [facility] [place] (Outpatient/Inpatient, Chemical)    (insurance only) [phone]             [facility] ([place]) [place], [place]     [facility]     [address]     [place], [place]       [facility] (for caregivers and family) [phone] [address] Kristeen Mans. [place], [place]      Residential Treatment Programs [facility]      [address] [place], [place] [address]  [phone]       [facility] [address] [place],  [address] [phone]  [facility] [place]        [phone]  [facility] [place] [phone]  [facility] ([place].)             [place], [place] or [place], [address] [phone]  [facility] [place]    [address]      [place], [place]       [facility] [address] [place], [place]  [place]   [address] [place], [place] [address]     [phone]      Admissions: 8am-3pm M-F  [facility] (RTS) [address] [place], [place]  [facility] Program: Residential Program [phone] Days)   [place], [place] or [phone]     ADATC: [facility] [place], [place] (Walk in Hours over the weekend or by referral)  [facility] [place], [place], [place] [address] [phone]  Crisis Mobile: Therapeutic  Alternatives:  [phone] (for crisis response 24 hours a day) [facility] Hotline:      [phone] Outpatient Psychiatry and Counseling  [facility]: Mobile Crisis Management 24 hours:  [phone]  [facility] of the [employer] sliding scale fee and walk in schedule: M-F 8am-12pm/1pm-3pm [place], [place] [address] [place], [place] [address] [phone]  [facility] (Formerly known as [facility] [employer])- new patient walk-in appointments available Monday - Friday 8am -3pm.          [address] [place], [place] [address] [phone] or crisis line- [place] [facility]/ Intensive Outpatient Therapy Program [place], [place] [address] [place]      [phone] N[address] [place], [place] [address]                 [place]   [facility] [phone]. [place], [place] [address]   [employer] of Care          [address] Johnette Abraham  [place], [place] [address]       [phone]  [place], [place],  [address] [phone]  [facility]    [address]    [place],  [address]     [place], [place] [address]  [place] [address]     [place] [address]  [facility]     [address], [place], [place], [place] [address] [place]     [address]     [place], [place]  [address]     [phone]       [facility] [address] [place], [place] [address] [phone] Resources for Temporary Residential Assistance/Crisis [facility]) M-F 8am-3pm   [address] [place], [place] [address]   [phone] Services include: laundry, barbering, support groups, case management, phone  & computer access, showers, AA/NA mtgs, mental health/substance abuse nurse, job skills class, disability information, VA assistance, spiritual classes, etc.   [facility]   [address], [place]     [place]              [employer] (women and children)       [place]. [place], [place] [address] [phone] [email or web address] for application and process Application Required  [facility]   [address]    [place] [address]     [phone]                    [place], [place] [address] [phone](YCXKGYJE application appt.) Application Required  [facility] (women only)    [address]     [place], [place] [address]     [phone]      Intake starts 6pm daily Need valid ID, SSC, & Police report [employer] [address] [place], [place] [phone] Application Required  [employer] (men only)     [place].      [place], [place]       [place] (Pregnant women only) [address] [place], [place]  [facility]      [place] [address]      [place], [place] [address]     [phone]             [facility] [address] [place], [place] 90 day commitment/SA/Application process  [facility](men only)     [address]     [place],  [place]       Check-in at [facility] [address] [place], [place] [address] [phone] Men/Women/Women and Children must be there by 7 pm  [place], [place]

## 2014-11-06 NOTE — ED Notes (Signed)
Pt removed own IV and disconnected himself from monitor again.

## 2014-11-06 NOTE — ED Provider Notes (Addendum)
CSN: [id number]     Arrival date & time 11/06/14  1804 History   First MD Initiated Contact with Patient 11/06/14 1805     Chief Complaint  Patient presents with  . Chest Pain     (Consider location/radiation/quality/duration/timing/severity/associated sxs/prior Treatment) HPI Comments: Patient presents to the emergency room father for evaluation of chest pain. Patient had lower chest discomfort earlier today that resolved prior to coming to the ER. He did not have any shortness of breath. Told the length of symptoms was approximately 30 minutes. He admits to drinking alcohol today. He reports that he has a long history of alcohol problems and would like to consider detox.  Patient is a 59 y.o. male presenting with chest pain.  Chest Pain   Past Medical History  Diagnosis Date  . Hepatitis C   . Diabetes mellitus   . Chronic hyponatremia     pt denies having   . Alcoholic   . Alcoholic cirrhosis of liver     Pt calls this a false diagnosis. Pt denies  . Thrombocytopenia   . Hyperglycemia   . Depression   . Anxiety    Past Surgical History  Procedure Laterality Date  . Plate in lower right leg  2008  . Fracture surgery  right lower leg plate placed years ago   Family History  Problem Relation Age of Onset  . ALS Father    History  Substance Use Topics  . Smoking status: Current Every Day Smoker -- 1.00 packs/day for 40 years    Types: Cigarettes  . Smokeless tobacco: Never Used  . Alcohol Use: Yes     Comment: 18/daily    Review of Systems  Cardiovascular: Positive for chest pain.  All other systems reviewed and are negative.     Allergies  Benadryl; Librium; and Sulfa antibiotics  Home Medications   Prior to Admission medications   Medication Sig Start Date End Date Taking? Authorizing Provider  clindamycin (CLEOCIN) 150 MG capsule Take 3 capsules (450 mg total) by mouth 3 (three) times daily. Patient not taking: Reported on 09/14/2014 02/28/14   Jarrett Soho  Muthersbaugh, PA-C  gabapentin (NEURONTIN) 600 MG tablet Take 600 mg by mouth 3 (three) times daily.    Historical Provider, MD  insulin NPH Human (HUMULIN N,NOVOLIN N) 100 UNIT/ML injection Inject 8 Units into the skin 2 (two) times daily.    Historical Provider, MD  LORazepam (ATIVAN) 1 MG tablet Take 1 tablet (1 mg total) by mouth 3 (three) times daily as needed. 10/19/14   Leonard Schwartz, MD  metFORMIN (GLUCOPHAGE) 1000 MG tablet Take 1 tablet (1,000 mg total) by mouth 2 (two) times daily with a meal. Patient not taking: Reported on 10/19/2014 01/15/14   Shuvon B Rankin, NP   BP 131/66 mmHg  Pulse 94  Temp(Src) 98.3 F (36.8 C) (Oral)  Resp 19  SpO2 92% Physical Exam  Constitutional: He is oriented to person, place, and time. He appears well-developed and well-nourished. No distress.  HENT:  Head: Normocephalic and atraumatic.  Right Ear: Hearing normal.  Left Ear: Hearing normal.  Nose: Nose normal.  Mouth/Throat: Oropharynx is clear and moist and mucous membranes are normal.  Eyes: Conjunctivae and EOM are normal. Pupils are equal, round, and reactive to light.  Neck: Normal range of motion. Neck supple.  Cardiovascular: Regular rhythm, S1 normal and S2 normal.  Exam reveals no gallop and no friction rub.   No murmur heard. Pulmonary/Chest: Effort normal and breath sounds normal.  No respiratory distress. He exhibits no tenderness.  Abdominal: Soft. Normal appearance and bowel sounds are normal. There is no hepatosplenomegaly. There is no tenderness. There is no rebound, no guarding, no tenderness at McBurney's point and negative Murphy's sign. No hernia.  Musculoskeletal: Normal range of motion.  Neurological: He is alert and oriented to person, place, and time. He has normal strength. No cranial nerve deficit or sensory deficit. Coordination normal. GCS eye subscore is 4. GCS verbal subscore is 5. GCS motor subscore is 6.  Skin: Skin is warm, dry and intact. No rash noted. No cyanosis.   Psychiatric: He has a normal mood and affect. His speech is normal and behavior is normal. Thought content normal.  Nursing note and vitals reviewed.   ED Course  Procedures (including critical care time) Labs Review Labs Reviewed  CBC WITH DIFFERENTIAL/PLATELET - Abnormal; Notable for the following:    RBC 3.67 (*)    Hemoglobin 12.8 (*)    HCT 35.3 (*)    MCH 34.9 (*)    MCHC 36.3 (*)    Platelets 114 (*)    All other components within normal limits  COMPREHENSIVE METABOLIC PANEL - Abnormal; Notable for the following:    Sodium 128 (*)    Chloride 92 (*)    Glucose, Bld 436 (*)    Calcium 8.5 (*)    Total Protein 6.3 (*)    Albumin 2.9 (*)    AST 123 (*)    ALT 94 (*)    Alkaline Phosphatase 139 (*)    All other components within normal limits  URINALYSIS, ROUTINE W REFLEX MICROSCOPIC (NOT AT [facility]) - Abnormal; Notable for the following:    Glucose, UA >1000 (*)    All other components within normal limits  I-STAT CG4 LACTIC ACID, ED - Abnormal; Notable for the following:    Lactic Acid, Venous 2.72 (*)    All other components within normal limits  TROPONIN I  LIPASE, BLOOD  BETA-HYDROXYBUTYRIC ACID  URINE MICROSCOPIC-ADD ON  URINE RAPID DRUG SCREEN, HOSP PERFORMED    Imaging Review Dg Chest 2 View  11/06/2014   CLINICAL DATA:  Chest pain for 1 day.  EXAM: CHEST  2 VIEW  COMPARISON:  10/22/2012  FINDINGS: Mild hyperinflation. Numerous leads and wires project over the chest. Midline trachea. Normal heart size and mediastinal contours. No pleural effusion or pneumothorax. Biapical pleural thickening. Azygos fissure. Clear lungs.  IMPRESSION: COPD/hyperinflation, without acute cardiopulmonary disease.   Electronically Signed   By: Abigail Miyamoto M.D.   On: 11/06/2014 19:05     EKG Interpretation   Date/Time:  Wednesday November 06 2014 18:49:56 EDT Ventricular Rate:  98 PR Interval:  134 QRS Duration: 110 QT Interval:  365 QTC Calculation: 466 R Axis:   74 Text  Interpretation:  Sinus rhythm Normal ECG Confirmed by POLLINA  MD,  CHRISTOPHER [phone]) on 11/06/2014 7:03:16 PM      MDM   Final diagnoses:  Chest pain   alcohol intoxication  Resents to the ER with atypical chest pain. Patient had nonexertional pain that lasted for less than 30 minutes and then resolved spontaneously. He has had no recurrence. Cardiac workup was normal. Patient notes alcohol intake, but does not appear to be significantly intoxicated at this time. He was initially requesting detox, but has now this is decided that he wants to leave. Will be given resources, Tegretol taper.    Orpah Greek, MD 11/06/14 2121  Orpah Greek, MD 11/06/14  2123

## 2014-11-06 NOTE — ED Notes (Signed)
Per EMS: pt coming [employer] with c/o intermittent central chest pain for 30 minutes. ETOH on board, pt requesting detox requesting ativan for four days. 12 lead unremarkable.

## 2014-11-06 NOTE — ED Notes (Signed)
Pt to x-ray

## 2014-11-08 ENCOUNTER — Encounter (HOSPITAL_COMMUNITY): Payer: Self-pay

## 2014-11-08 ENCOUNTER — Emergency Department (HOSPITAL_COMMUNITY)
Admission: EM | Admit: 2014-11-08 | Discharge: 2014-11-08 | Disposition: A | Discharge status: Home / Self Care | Payer: Self-pay | Attending: Emergency Medicine | Admitting: Emergency Medicine

## 2014-11-08 DIAGNOSIS — E119 Type 2 diabetes mellitus without complications: Secondary | ICD-10-CM | POA: Insufficient documentation

## 2014-11-08 DIAGNOSIS — F1010 Alcohol abuse, uncomplicated: Secondary | ICD-10-CM | POA: Insufficient documentation

## 2014-11-08 DIAGNOSIS — Z79899 Other long term (current) drug therapy: Secondary | ICD-10-CM | POA: Insufficient documentation

## 2014-11-08 DIAGNOSIS — Z8619 Personal history of other infectious and parasitic diseases: Secondary | ICD-10-CM | POA: Insufficient documentation

## 2014-11-08 DIAGNOSIS — Z862 Personal history of diseases of the blood and blood-forming organs and certain disorders involving the immune mechanism: Secondary | ICD-10-CM | POA: Insufficient documentation

## 2014-11-08 DIAGNOSIS — F419 Anxiety disorder, unspecified: Secondary | ICD-10-CM | POA: Insufficient documentation

## 2014-11-08 DIAGNOSIS — F329 Major depressive disorder, single episode, unspecified: Secondary | ICD-10-CM | POA: Insufficient documentation

## 2014-11-08 DIAGNOSIS — Z8719 Personal history of other diseases of the digestive system: Secondary | ICD-10-CM | POA: Insufficient documentation

## 2014-11-08 DIAGNOSIS — Z720 Tobacco use: Secondary | ICD-10-CM | POA: Insufficient documentation

## 2014-11-08 MED ORDER — ONDANSETRON 4 MG PO TBDP: ORAL_TABLET | ORAL | Status: DC

## 2014-11-08 NOTE — ED Notes (Signed)
Pt refusing to take discharge paperwork and prescription for Zofran with him as he left them laying on stretcher.  Pt states, " I aint going to be no [place] pig."

## 2014-11-08 NOTE — Discharge Instructions (Signed)
Alcohol Withdrawal Anytime drug use is interfering with normal living activities it has become abuse. This includes problems with family and friends. Psychological dependence has developed when your mind tells you that the drug is needed. This is usually followed by physical dependence when a continuing increase of drugs are required to get the same feeling or "high." This is known as addiction or chemical dependency. A person's risk is much higher if there is a history of chemical dependency in the family. Mild Withdrawal Following Stopping Alcohol, When Addiction or Chemical Dependency Has Developed When a person has developed tolerance to alcohol, any sudden stopping of alcohol can cause uncomfortable physical symptoms. Most of the time these are mild and consist of tremors in the hands and increases in heart rate, breathing, and temperature. Sometimes these symptoms are associated with anxiety, panic attacks, and bad dreams. There may also be stomach upset. Normal sleep patterns are often interrupted with periods of inability to sleep (insomnia). This may last for 6 months. Because of this discomfort, many people choose to continue drinking to get rid of this discomfort and to try to feel normal. Severe Withdrawal with Decreased or No Alcohol Intake, When Addiction or Chemical Dependency Has Developed About five percent of alcoholics will develop signs of severe withdrawal when they stop using alcohol. One sign of this is development of generalized seizures (convulsions). Other signs of this are severe agitation and confusion. This may be associated with believing in things which are not real or seeing things which are not really there (delusions and hallucinations). Vitamin deficiencies are usually present if alcohol intake has been long-term. Treatment for this most often requires hospitalization and close observation. Addiction can only be helped by stopping use of all chemicals. This is hard but may  save your life. With continual alcohol use, possible outcomes are usually loss of self respect and esteem, violence, and death. Addiction cannot be cured but it can be stopped. This often requires outside help and the care of professionals. Treatment centers are listed in the yellow pages under Cocaine, Narcotics, and Alcoholics Anonymous. Most hospitals and clinics can refer you to a specialized care center. It is not necessary for you to go through the uncomfortable symptoms of withdrawal. Your caregiver can provide you with medicines that will help you through this difficult period. Try to avoid situations, friends, or drugs that made it possible for you to keep using alcohol in the past. Learn how to say no. It takes a long period of time to overcome addictions to all drugs, including alcohol. There may be many times when you feel as though you want a drink. After getting rid of the physical addiction and withdrawal, you will have a lessening of the craving which tells you that you need alcohol to feel normal. Call your caregiver if more support is needed. Learn who to talk to in your family and among your friends so that during these periods you can receive outside help. Alcoholics Anonymous (AA) has helped many people over the years. To get further help, contact AA or call your caregiver, counselor, or clergyperson. Al-Anon and Alateen are support groups for friends and family members of an alcoholic. The people who love and care for an alcoholic often need help, too. For information about these organizations, check your phone directory or call a local alcoholism treatment center.  SEEK IMMEDIATE MEDICAL CARE IF:   You have a seizure.  You have a fever.  You experience uncontrolled vomiting or you  vomit up blood. This may be bright red or look like black coffee grounds.  You have blood in the stool. This may be bright red or appear as a black, tarry, bad-smelling stool.  You become lightheaded or  faint. Do not drive if you feel this way. Have someone else drive you or call 858 for help.  You become more agitated or confused.  You develop uncontrolled anxiety.  You begin to see things that are not really there (hallucinate). Your caregiver has determined that you completely understand your medical condition, and that your mental state is back to normal. You understand that you have been treated for alcohol withdrawal, have agreed not to drink any alcohol for a minimum of 1 day, will not operate a car or other machinery for 24 hours, and have had an opportunity to ask any questions about your condition. Document Released: 02/03/2005 Document Revised: 07/19/2011 Document Reviewed: 12/13/2007 [facility] Patient Information 2015 [place], [place]. This information is not intended to replace advice given to you by your health care provider. Make sure you discuss any questions you have with your health care provider.  Alcohol Use Disorder Alcohol use disorder is a mental disorder. It is not a one-time incident of heavy drinking. Alcohol use disorder is the excessive and uncontrollable use of alcohol over time that leads to problems with functioning in one or more areas of daily living. People with this disorder risk harming themselves and others when they drink to excess. Alcohol use disorder also can cause other mental disorders, such as mood and anxiety disorders, and serious physical problems. People with alcohol use disorder often misuse other drugs.  Alcohol use disorder is common and widespread. Some people with this disorder drink alcohol to cope with or escape from negative life events. Others drink to relieve chronic pain or symptoms of mental illness. People with a family history of alcohol use disorder are at higher risk of losing control and using alcohol to excess.  SYMPTOMS  Signs and symptoms of alcohol use disorder may include the following:   Consumption ofalcohol inlarger amounts or  over a longer period of time than intended.  Multiple unsuccessful attempts to cutdown or control alcohol use.   A great deal of time spent obtaining alcohol, using alcohol, or recovering from the effects of alcohol (hangover).  A strong desire or urge to use alcohol (cravings).   Continued use of alcohol despite problems at work, school, or home because of alcohol use.   Continued use of alcohol despite problems in relationships because of alcohol use.  Continued use of alcohol in situations when it is physically hazardous, such as driving a car.  Continued use of alcohol despite awareness of a physical or psychological problem that is likely related to alcohol use. Physical problems related to alcohol use can involve the brain, heart, liver, stomach, and intestines. Psychological problems related to alcohol use include intoxication, depression, anxiety, psychosis, delirium, and dementia.   The need for increased amounts of alcohol to achieve the same desired effect, or a decreased effect from the consumption of the same amount of alcohol (tolerance).  Withdrawal symptoms upon reducing or stopping alcohol use, or alcohol use to reduce or avoid withdrawal symptoms. Withdrawal symptoms include:  Racing heart.  Hand tremor.  Difficulty sleeping.  Nausea.  Vomiting.  Hallucinations.  Restlessness.  Seizures. DIAGNOSIS Alcohol use disorder is diagnosed through an assessment by your health care provider. Your health care provider may start by asking three or four questions  to screen for excessive or problematic alcohol use. To confirm a diagnosis of alcohol use disorder, at least two symptoms must be present within a 74-monthperiod. The severity of alcohol use disorder depends on the number of symptoms:  Mild--two or three.  Moderate--four or five.  Severe--six or more. Your health care provider may perform a physical exam or use results from lab tests to see if you have  physical problems resulting from alcohol use. Your health care provider may refer you to a mental health professional for evaluation. TREATMENT  Some people with alcohol use disorder are able to reduce their alcohol use to low-risk levels. Some people with alcohol use disorder need to quit drinking alcohol. When necessary, mental health professionals with specialized training in substance use treatment can help. Your health care provider can help you decide how severe your alcohol use disorder is and what type of treatment you need. The following forms of treatment are available:   Detoxification. Detoxification involves the use of prescription medicines to prevent alcohol withdrawal symptoms in the first week after quitting. This is important for people with a history of symptoms of withdrawal and for heavy drinkers who are likely to have withdrawal symptoms. Alcohol withdrawal can be dangerous and, in severe cases, cause death. Detoxification is usually provided in a hospital or in-patient substance use treatment facility.  Counseling or talk therapy. Talk therapy is provided by substance use treatment counselors. It addresses the reasons people use alcohol and ways to keep them from drinking again. The goals of talk therapy are to help people with alcohol use disorder find healthy activities and ways to cope with life stress, to identify and avoid triggers for alcohol use, and to handle cravings, which can cause relapse.  Medicines.Different medicines can help treat alcohol use disorder through the following actions:  Decrease alcohol cravings.  Decrease the positive reward response felt from alcohol use.  Produce an uncomfortable physical reaction when alcohol is used (aversion therapy).  Support groups. Support groups are run by people who have quit drinking. They provide emotional support, advice, and guidance. These forms of treatment are often combined. Some people with alcohol use disorder  benefit from intensive combination treatment provided by specialized substance use treatment centers. Both inpatient and outpatient treatment programs are available. Document Released: 06/03/2004 Document Revised: 09/10/2013 Document Reviewed: 08/03/2012 [facility] Information 2015 [place] This information is not intended to replace advice given to you by your health care provider. Make sure you discuss any questions you have with your health care provider.  Alcohol Use Disorder Alcohol use disorder is a mental disorder. It is not a one-time incident of heavy drinking. Alcohol use disorder is the excessive and uncontrollable use of alcohol over time that leads to problems with functioning in one or more areas of daily living. People with this disorder risk harming themselves and others when they drink to excess. Alcohol use disorder also can cause other mental disorders, such as mood and anxiety disorders, and serious physical problems. People with alcohol use disorder often misuse other drugs.  Alcohol use disorder is common and widespread. Some people with this disorder drink alcohol to cope with or escape from negative life events. Others drink to relieve chronic pain or symptoms of mental illness. People with a family history of alcohol use disorder are at higher risk of losing control and using alcohol to excess.  SYMPTOMS  Signs and symptoms of alcohol use disorder may include the following:   Consumption ofalcohol  inlarger amounts or over a longer period of time than intended.  Multiple unsuccessful attempts to cutdown or control alcohol use.   A great deal of time spent obtaining alcohol, using alcohol, or recovering from the effects of alcohol (hangover).  A strong desire or urge to use alcohol (cravings).   Continued use of alcohol despite problems at work, school, or home because of alcohol use.   Continued use of alcohol despite problems in relationships because  of alcohol use.  Continued use of alcohol in situations when it is physically hazardous, such as driving a car.  Continued use of alcohol despite awareness of a physical or psychological problem that is likely related to alcohol use. Physical problems related to alcohol use can involve the brain, heart, liver, stomach, and intestines. Psychological problems related to alcohol use include intoxication, depression, anxiety, psychosis, delirium, and dementia.   The need for increased amounts of alcohol to achieve the same desired effect, or a decreased effect from the consumption of the same amount of alcohol (tolerance).  Withdrawal symptoms upon reducing or stopping alcohol use, or alcohol use to reduce or avoid withdrawal symptoms. Withdrawal symptoms include:  Racing heart.  Hand tremor.  Difficulty sleeping.  Nausea.  Vomiting.  Hallucinations.  Restlessness.  Seizures. DIAGNOSIS Alcohol use disorder is diagnosed through an assessment by your health care provider. Your health care provider may start by asking three or four questions to screen for excessive or problematic alcohol use. To confirm a diagnosis of alcohol use disorder, at least two symptoms must be present within a 11-monthperiod. The severity of alcohol use disorder depends on the number of symptoms:  Mild--two or three.  Moderate--four or five.  Severe--six or more. Your health care provider may perform a physical exam or use results from lab tests to see if you have physical problems resulting from alcohol use. Your health care provider may refer you to a mental health professional for evaluation. TREATMENT  Some people with alcohol use disorder are able to reduce their alcohol use to low-risk levels. Some people with alcohol use disorder need to quit drinking alcohol. When necessary, mental health professionals with specialized training in substance use treatment can help. Your health care provider can help you  decide how severe your alcohol use disorder is and what type of treatment you need. The following forms of treatment are available:   Detoxification. Detoxification involves the use of prescription medicines to prevent alcohol withdrawal symptoms in the first week after quitting. This is important for people with a history of symptoms of withdrawal and for heavy drinkers who are likely to have withdrawal symptoms. Alcohol withdrawal can be dangerous and, in severe cases, cause death. Detoxification is usually provided in a hospital or in-patient substance use treatment facility.  Counseling or talk therapy. Talk therapy is provided by substance use treatment counselors. It addresses the reasons people use alcohol and ways to keep them from drinking again. The goals of talk therapy are to help people with alcohol use disorder find healthy activities and ways to cope with life stress, to identify and avoid triggers for alcohol use, and to handle cravings, which can cause relapse.  Medicines.Different medicines can help treat alcohol use disorder through the following actions:  Decrease alcohol cravings.  Decrease the positive reward response felt from alcohol use.  Produce an uncomfortable physical reaction when alcohol is used (aversion therapy).  Support groups. Support groups are run by people who have quit drinking. They provide emotional support,  advice, and guidance. These forms of treatment are often combined. Some people with alcohol use disorder benefit from intensive combination treatment provided by specialized substance use treatment centers. Both inpatient and outpatient treatment programs are available. Document Released: 06/03/2004 Document Revised: 09/10/2013 Document Reviewed: 08/03/2012 [facility] Patient Information 2015 [place], [place]. This information is not intended to replace advice given to you by your health care provider. Make sure you discuss any questions you have with your  health care provider.    Emergency Department Resource Guide 1) Find a Doctor and Pay Out of Pocket Although you won't have to find out who is covered by your insurance plan, it is a good idea to ask around and get recommendations. You will then need to call the office and see if the doctor you have chosen will accept you as a new patient and what types of options they offer for patients who are self-pay. Some doctors offer discounts or will set up payment plans for their patients who do not have insurance, but you will need to ask so you aren't surprised when you get to your appointment.  2) Contact Your Local Health Department Not all health departments have doctors that can see patients for sick visits, but many do, so it is worth a call to see if yours does. If you don't know where your local health department is, you can check in your phone book. The CDC also has a tool to help you locate your state's health department, and many state websites also have listings of all of their local health departments.  3) Find a [facility] If your illness is not likely to be very severe or complicated, you may want to try a walk in clinic. These are popping up all over the country in pharmacies, drugstores, and shopping centers. They're usually staffed by nurse practitioners or physician assistants that have been trained to treat common illnesses and complaints. They're usually fairly quick and inexpensive. However, if you have serious medical issues or chronic medical problems, these are probably not your best option.  No Primary Care Doctor: - Call Health Connect at  [phone] - they can help you locate a primary care doctor that  accepts your insurance, provides certain services, etc. - Physician Referral Service- [phone]  Chronic Pain Problems: Organization         Address  Phone   Notes  [facility]  [phone] Patients need to be referred by their primary care  doctor.   Medication Assistance: Organization         Address  Phone   Notes  [facility] Medication [facility] [address] [place], [place] [address] [phone] --Must be a resident of [facility] -- Must have NO insurance coverage whatsoever (no Medicaid/ Medicare, etc.) -- The pt. MUST have a primary care doctor that directs their care regularly and follows them in the community   [facility]  [phone]   [employer]  [phone]    Agencies that provide inexpensive medical care: Organization         Address  Phone   Notes  [place]  [phone]   [facility]    [phone]   [facility] [place], [place] [address] [phone]   [place] [address], [place] [phone]   Planned Parenthood    [phone]   [facility]    [phone]   [address], [place] Phone:  [phone], Fax:  [phone] Hours of Operation:  9 am - 6 pm, M-F.  Also accepts Medicaid/Medicare and self-pay.  [facility] for [place], [address], [place] Phone: [phone], Fax: [phone]. Hours of Operation:  8:30 am - 5:30 pm, M-F.  Also accepts Medicaid and self-pay.  [facility] [place] [address], [place] Phone: [phone]   [place], [place], [place] [phone], Ext. [phone] Mondays & Thursdays: 7-9 AM.  First 15 patients are seen on a first come, first serve basis.    [place] Providers:  Organization         Address  Phone   Notes  [facility] [address], [place] [phone] Also accepts self-pay patients.  [facility] [address], [place]  [phone]   [place], [address], [place] [phone]   [facility] [address] [place] [phone]   Lucianne Lei [address], [place]   [phone] Only accepts [place] patients after they have their name applied to their card.   Self-Pay (no insurance) in [facility]:  Organization         Address  Phone   Notes  Sickle Cell Patients, [facility] [place] [phone]   [facility] [place] [phone]   [facility] [place]  [place], [place], [place] [phone]   [facility]/Dr. Osei-Bonsu  [address] [place] or [address], [place] [phone] Phone number for both [place] and [place] locations is the same.  [facility] [address], [place] [phone]   [facility] [address], [place] or [address] [phone]   [facility] [address] [place] [phone], phone; [phone], fax Sees patients 1st and 3rd Saturday of every month.  Must not qualify for public or private insurance (i.e. Medicaid, Medicare, [place] Health Choice, Veterans' Benefits)  Household income should be no more than 200% of the poverty level The clinic cannot treat you if you are pregnant or think you are pregnant  Sexually transmitted diseases are not treated at the clinic.    Dental Care: Organization         Address  Phone  Notes  [facility] Department of [facility] [place] [phone] Accepts children up to age 35 who are enrolled in [place] or [place]; pregnant women with a Medicaid card; and children who have applied for Medicaid or [place] Health Choice, but were declined, whose parents can pay a reduced fee at time of  service.  [facility] Department of [facility]  [address], [place] [phone] Accepts children up to age 70 who are enrolled in [place] or [place]; pregnant women with a Medicaid card; and children who have applied for Medicaid or [place] Health Choice, but were declined, whose parents can pay a reduced fee at time of service.  [place] [facility]  [facility] [phone] Patients are seen by appointment only. Walk-ins are not accepted. [place] will see patients 62 years of age and older. Monday - Tuesday (8am-5pm) Most Wednesdays (8:30-5pm) $30 per visit, cash only  [facility] Adult Dental Access PROGRAM  [address], [facility] [phone] Patients are seen by appointment only. Walk-ins are not accepted. [place] will see patients 63 years of age and older. One Wednesday Evening (Monthly: Volunteer Based).  $30 per visit, cash only  [address]  [phone] for adults; Children under age 73, call Graduate Pediatric Dentistry at [phone]. Children aged 40-14, please call [phone] to request a pediatric application.  Dental services are provided in all areas of dental care including fillings, crowns and bridges, complete and partial dentures, implants, gum treatment, root canals, and extractions. Preventive care is also provided. Treatment is provided to both adults and children. Patients are selected via a lottery and there is often a waiting list.   [facility] [address] [place]  [phone] [email or web address]   [facility] [address] [place], [place] [phone], Ext. [phone] Second and Fourth Thursday of each month, opens at 6:30 AM; Clinic ends at 9 AM.  Patients are seen on a first-come first-served basis, and a limited number are seen during each clinic.   [facility]  [address] Hillard Danker [place],  [place] [phone]   Eligibility Requirements You must have lived in [place], [place], or [place] counties for at least the last three months.   You cannot be eligible for state or federal sponsored [employer], including [employer], [place], or [employer].   You generally cannot be eligible for healthcare insurance through your employer.    How to apply: Eligibility screenings are held every Tuesday and Wednesday afternoon from 1:00 pm until 4:00 pm. You do not need an appointment for the interview!  [facility] [address] [place], [place]   [place] Department  [place]  [phone]    Behavioral Health Resources in the Community: Intensive Outpatient Programs Organization         Address  Phone  Notes  [place]. [address], [place], [place] [phone]   [facility] Outpatient [address] [place], [place]   [facility]: [facility] [address] [place], [place]   [place] [address]  [place], [place] or [phone]   Substance Abuse Resources Organization         Address  Phone  Notes  [facility]  [phone]   [place]  [phone]   [facility]   Chinita Pester  [phone]   Residential & Outpatient Substance Abuse Program  [phone]   Psychological Services Organization         Address  Phone  Notes  [facility]  [place]  [phone]   [place] [address] [place] [phone] or [phone]    Mobile Crisis Teams Organization         Address  Phone  Notes  [facility], Mobile Crisis Care Unit  [phone]   [facility]  [address] [place], [place]   [facility] [address] [place] [phone]    Self-Help/Support Groups Organization         Address  Phone             Notes  [place] [place] - variety of support groups  [facility] Call for more information  Narcotics Anonymous (NA), [facility] [address], [employer] [place]  2 meetings at this location   Special educational needs teacher         Address  Phone  Notes  [facility] [place],    [place]  [phone]   [facility]  [address], [place] [id number], [place], [place]   [place], [place] [phone] Admissions: 8am-3pm M-F  [facility] [place] [address]    [place], [place] [phone]   [facility] [address] [place], [place], [place]   [facility] [address],  [place], [place]   [facility] - Intensive Outpatient [address], [place], [place]   [facility] [address]  [place], [place] [phone] or [phone]   [facility] ([facility]) [address] [place], [place] Accepts Medicaid  [facility] [place] [address]  [place] [phone] Substance Abuse/Addiction Treatment   [facility] Organization         Address  Phone  Notes  [facility]  [phone]   Domenic Schwab, PhD [address] Arlis Porta [place], [place]   [phone] or [phone]   [place] [address] [place], [place] [phone]   [facility] [address] [place], [place] [phone] Insurance/Medicaid/sponsorship through [facility] and Families [address], [address] [place]                                    [place], [place] [phone]  [place] [address], [place] [phone]    Dr. Adele Schilder  [phone]   [facility] 1) [address], [place] 2) [place] 3)  [place] [address], [place] [phone]  [phone]   [place] [phone] or [phone] (After Hours)

## 2014-11-08 NOTE — ED Provider Notes (Signed)
CSN: [id number]     Arrival date & time 11/08/14  1627 History   First MD Initiated Contact with Patient 11/08/14 1632     No chief complaint on file.    (Consider location/radiation/quality/duration/timing/severity/associated sxs/prior Treatment) HPI [name] is a 59 y.o. male with a history of alcohol abuse, cirrhosis of the liver, hepatitis C, diabetes comes in for evaluation of alcohol detox. This is a chronic problem for patient. He has been seen in the ED multiple times for this problem. Patient states he is ready to detox from alcohol and states he needs Ativan. Reports he was given Tegretol and that made him "violently ill with nausea and vomiting". Patient denies having taken any Tegretol today, but does report having had 2 40 ounce beers today. When reminded of patient's failure to detox with Ativan in the past, patient states "something in my life always goes wrong, I did clean for 3 days and then something happens and I go back to drinking". No other auditory or visual hallucinations. No suicidal homicidal ideation.  Past Medical History  Diagnosis Date  . Hepatitis C   . Diabetes mellitus   . Chronic hyponatremia     pt denies having   . Alcoholic   . Alcoholic cirrhosis of liver     Pt calls this a false diagnosis. Pt denies  . Thrombocytopenia   . Hyperglycemia   . Depression   . Anxiety    Past Surgical History  Procedure Laterality Date  . Plate in lower right leg  2008  . Fracture surgery  right lower leg plate placed years ago   Family History  Problem Relation Age of Onset  . ALS Father    History  Substance Use Topics  . Smoking status: Current Every Day Smoker -- 1.00 packs/day for 40 years    Types: Cigarettes  . Smokeless tobacco: Never Used  . Alcohol Use: Yes     Comment: 18/daily    Review of Systems A 10 point review of systems was completed and was negative except for pertinent positives and negatives as mentioned in the history of  present illness    Allergies  Benadryl; Librium; and Sulfa antibiotics  Home Medications   Prior to Admission medications   Medication Sig Start Date End Date Taking? Authorizing Provider  carbamazepine (TEGRETOL) 200 MG tablet 849m PO QD X 1D, then 6042mPO QD X 1D, then 40032mD X 1D, then 200m64m QD X 2D Patient taking differently: Take 200-800 mg by mouth See admin instructions. 800mg45mQD X 1D, then 600mg 108mD X 1D, then 400mg Q45m1D, then 200mg PO110mX 2D 11/06/14  Yes ChristopOrpah Greekbapentin (NEURONTIN) 600 MG tablet Take 600 mg by mouth 3 (three) times daily.   Yes Historical Provider, MD  insulin NPH Human (HUMULIN N,NOVOLIN N) 100 UNIT/ML injection Inject 8 Units into the skin 2 (two) times daily.   Yes Historical Provider, MD  metFORMIN (GLUCOPHAGE) 1000 MG tablet Take 1 tablet (1,000 mg total) by mouth 2 (two) times daily with a meal. 01/15/14  Yes Shuvon B Rankin, NP  clindamycin (CLEOCIN) 150 MG capsule Take 3 capsules (450 mg total) by mouth 3 (three) times daily. Patient not taking: Reported on 09/14/2014 02/28/14   Hannah MJarrett Sohobaugh, PA-C  LORazepam (ATIVAN) 1 MG tablet Take 1 tablet (1 mg total) by mouth 3 (three) times daily as needed. Patient not taking: Reported on 11/08/2014 10/19/14  Leonard Schwartz, MD  ondansetron (ZOFRAN ODT) 4 MG disintegrating tablet 57m ODT q4 hours prn nausea/vomit 11/08/14   Trease Bremner, PA-C   BP 120/71 mmHg  Pulse 100  Temp(Src) 98.6 F (37 C) (Oral)  Resp 18  SpO2 96% Physical Exam  Constitutional: He is oriented to person, place, and time. He appears well-developed and well-nourished.  HENT:  Head: Normocephalic and atraumatic.  Dry mucous membranes  Eyes: Conjunctivae are normal. Pupils are equal, round, and reactive to light. Right eye exhibits no discharge. Left eye exhibits no discharge. No scleral icterus.  Neck: Neck supple.  Cardiovascular: Normal rate, regular rhythm and normal heart sounds.    Pulmonary/Chest: Effort normal and breath sounds normal. No respiratory distress. He has no wheezes. He has no rales.  Abdominal: Soft. There is no tenderness.  Musculoskeletal: Normal range of motion. He exhibits no tenderness.  Neurological: He is alert and oriented to person, place, and time.  Cranial Nerves II-XII grossly intact. Moves all extremities without ataxia. No focal neurologic deficits. Gait is baseline. No tremor noted  Skin: Skin is warm and dry. No rash noted.  Psychiatric: He has a normal mood and affect.  Nursing note and vitals reviewed.   ED Course  Procedures (including critical care time) Labs Review Labs Reviewed - No data to display  Imaging Review Dg Chest 2 View  11/06/2014   CLINICAL DATA:  Chest pain for 1 day.  EXAM: CHEST  2 VIEW  COMPARISON:  10/22/2012  FINDINGS: Mild hyperinflation. Numerous leads and wires project over the chest. Midline trachea. Normal heart size and mediastinal contours. No pleural effusion or pneumothorax. Biapical pleural thickening. Azygos fissure. Clear lungs.  IMPRESSION: COPD/hyperinflation, without acute cardiopulmonary disease.   Electronically Signed   By: KAbigail MiyamotoM.D.   On: 11/06/2014 19:05     EKG Interpretation None     Meds given in ED:  Medications - No data to display  Discharge Medication List as of 11/08/2014  5:31 PM    START taking these medications   Details  ondansetron (ZOFRAN ODT) 4 MG disintegrating tablet 470mODT q4 hours prn nausea/vomit, Print       Filed Vitals:   11/08/14 1629 11/08/14 1802  BP: 127/67 120/71  Pulse: 96 100  Temp: 98.6 F (37 C)   TempSrc: Oral   Resp: 16 18  SpO2: 93% 96%    MDM  Vitals stable - WNL -afebrile Pt resting comfortably in ED. Is sleeping upon multiple re-evaluations. PE--physical exam is unremarkable.  DDX--patient with chronic alcoholism and repeated attempts with Ativan for detox. Discussed we'll no longer discharge with Ativan, will DC with  outpatient resource guide. Patient is alert, oriented, answers questions appropriately, gait is baseline, no tremor and he is appropriate for discharge. Given Zofran 4 nausea and vomiting associated with Tegretol.  I discussed all relevant lab findings and imaging results with pt and they verbalized understanding. Discussed f/u with PCP within 48 hrs and return precautions, pt very amenable to plan. Prior to patient discharge, I discussed and reviewed this case with Dr.McManus   Final diagnoses:  Alcohol abuse        BeComer LocketPA-C 11/08/14 [id number] 11/11/14 [id number]

## 2014-11-08 NOTE — ED Notes (Signed)
Pt presents via EMS with c/o medication reaction and alcohol intoxication. Per EMS, pt checked himself out of rehab Wednesday morning, took his tegretol yesterday, and he reports that it made him "violently ill". Pt reports he was nauseated and was vomiting. Pt did not take his tegretol today but he did consume 2 40's.

## 2014-11-18 ENCOUNTER — Encounter (HOSPITAL_COMMUNITY): Payer: Self-pay | Admitting: *Deleted

## 2014-11-18 ENCOUNTER — Emergency Department (HOSPITAL_COMMUNITY)
Admission: EM | Admit: 2014-11-18 | Discharge: 2014-11-18 | Discharge status: Against Medical Advice | Payer: Self-pay | Attending: Emergency Medicine | Admitting: Emergency Medicine

## 2014-11-18 DIAGNOSIS — Z862 Personal history of diseases of the blood and blood-forming organs and certain disorders involving the immune mechanism: Secondary | ICD-10-CM | POA: Insufficient documentation

## 2014-11-18 DIAGNOSIS — Z794 Long term (current) use of insulin: Secondary | ICD-10-CM | POA: Insufficient documentation

## 2014-11-18 DIAGNOSIS — F419 Anxiety disorder, unspecified: Secondary | ICD-10-CM | POA: Insufficient documentation

## 2014-11-18 DIAGNOSIS — F10220 Alcohol dependence with intoxication, uncomplicated: Secondary | ICD-10-CM | POA: Insufficient documentation

## 2014-11-18 DIAGNOSIS — Z8719 Personal history of other diseases of the digestive system: Secondary | ICD-10-CM | POA: Insufficient documentation

## 2014-11-18 DIAGNOSIS — Z8619 Personal history of other infectious and parasitic diseases: Secondary | ICD-10-CM | POA: Insufficient documentation

## 2014-11-18 DIAGNOSIS — Z79899 Other long term (current) drug therapy: Secondary | ICD-10-CM | POA: Insufficient documentation

## 2014-11-18 DIAGNOSIS — F329 Major depressive disorder, single episode, unspecified: Secondary | ICD-10-CM | POA: Insufficient documentation

## 2014-11-18 DIAGNOSIS — Z720 Tobacco use: Secondary | ICD-10-CM | POA: Insufficient documentation

## 2014-11-18 NOTE — ED Provider Notes (Signed)
CSN: [id number]     Arrival date & time 11/18/14  0035 History   First MD Initiated Contact with Patient 11/18/14 0112     Chief Complaint  Patient presents with  . Alcohol Problem     (Consider location/radiation/quality/duration/timing/severity/associated sxs/prior Treatment) Patient is a 59 y.o. male presenting with alcohol problem. The history is provided by the patient. No language interpreter was used.  Alcohol Problem Pertinent negatives include no chills or fever. Associated symptoms comments: He presents with request for alcohol detox. No SI/HI, hallucinations. He states he has been through detox many times and requests admission to [facility]. No pain, vomiting, tremors..    Past Medical History  Diagnosis Date  . Hepatitis C   . Diabetes mellitus   . Chronic hyponatremia     pt denies having   . Alcoholic   . Alcoholic cirrhosis of liver     Pt calls this a false diagnosis. Pt denies  . Thrombocytopenia   . Hyperglycemia   . Depression   . Anxiety    Past Surgical History  Procedure Laterality Date  . Plate in lower right leg  2008  . Fracture surgery  right lower leg plate placed years ago   Family History  Problem Relation Age of Onset  . ALS Father    History  Substance Use Topics  . Smoking status: Current Every Day Smoker -- 1.00 packs/day for 40 years    Types: Cigarettes  . Smokeless tobacco: Never Used  . Alcohol Use: Yes     Comment: 18/daily    Review of Systems  Constitutional: Negative for fever and chills.  HENT: Negative.   Respiratory: Negative.   Cardiovascular: Negative.   Gastrointestinal: Negative.   Musculoskeletal: Negative.   Skin: Negative.   Neurological: Negative.       Allergies  Benadryl; Librium; and Sulfa antibiotics  Home Medications   Prior to Admission medications   Medication Sig Start Date End Date Taking? Authorizing Provider  carbamazepine (TEGRETOL) 200 MG tablet 850m PO QD X 1D, then 6059m PO QD X 1D, then 40062mD X 1D, then 200m18m QD X 2D Patient taking differently: Take 200-800 mg by mouth See admin instructions. 800mg34mQD X 1D, then 600mg 2mD X 1D, then 400mg Q51m1D, then 200mg PO28mX 2D 11/06/14   ChristopOrpah Greekindamycin (CLEOCIN) 150 MG capsule Take 3 capsules (450 mg total) by mouth 3 (three) times daily. Patient not taking: Reported on 09/14/2014 02/28/14   Hannah MJarrett Sohobaugh, PA-C  gabapentin (NEURONTIN) 600 MG tablet Take 600 mg by mouth 3 (three) times daily.    Historical Provider, MD  insulin NPH Human (HUMULIN N,NOVOLIN N) 100 UNIT/ML injection Inject 8 Units into the skin 2 (two) times daily.    Historical Provider, MD  LORazepam (ATIVAN) 1 MG tablet Take 1 tablet (1 mg total) by mouth 3 (three) times daily as needed. Patient not taking: Reported on 11/08/2014 10/19/14   Robert BLeonard SchwartztFORMIN (GLUCOPHAGE) 1000 MG tablet Take 1 tablet (1,000 mg total) by mouth 2 (two) times daily with a meal. 01/15/14   Shuvon B Rankin, NP  ondansetron (ZOFRAN ODT) 4 MG disintegrating tablet 4mg ODT 55mhours prn nausea/vomit 11/08/14   Benjamin Comer LocketBP 109/73 mmHg  Pulse 92  Temp(Src) 98 F (36.7 C) (Oral)  Resp 20  SpO2 94% Physical Exam  Constitutional: He is oriented to person, place, and time.  He appears well-developed and well-nourished.  Does not appear significantly intoxicated.   Neck: Normal range of motion.  Pulmonary/Chest: Effort normal.  Musculoskeletal: Normal range of motion.  Neurological: He is alert and oriented to person, place, and time.  Skin: Skin is warm and dry.    ED Course  Procedures (including critical care time) Labs Review Labs Reviewed - No data to display  Imaging Review No results found.   EKG Interpretation None      MDM   Final diagnoses:  None    1. Alcohol dependence  No suicidal or homicidal thoughts. He is coherent and coordinated. Feel that he can be discharged with resources for both  residential and outpatient programs. He is encouraged to follow up outpatient.   The patient becomes angry that inpatient treatment would not be offered tonight as it has in the past. Explained that as a stable patient he is able to place himself in a treatment program and is encouraged to do so. He leaves prior to discharge stating "I'm just going to get out of here then.'  Charlann Lange, PA-C 11/18/14 [place], MD 11/20/14 0050

## 2014-11-18 NOTE — ED Notes (Signed)
Charlann Lange, PA in to see pt; pt upset because he is wanting Ativan and a prescription and inpatient detox. Pt states "I am just going to leave and say I am suicidal until I get what I want; pt walked out of triage and left AMA

## 2014-11-18 NOTE — ED Notes (Signed)
Bed: [facility]3 Expected date:  Expected time:  Means of arrival:  Comments: 59 yo M detox

## 2014-11-18 NOTE — ED Notes (Signed)
Pt arrives to the ER via EMS requesting detox from ETOH; pt states that he vomited today and he never does that so he figured it was time to stop drinking; pt denies SI / HI

## 2014-11-20 ENCOUNTER — Encounter (HOSPITAL_COMMUNITY): Payer: Self-pay | Admitting: *Deleted

## 2014-11-20 ENCOUNTER — Emergency Department (HOSPITAL_COMMUNITY)
Admission: EM | Admit: 2014-11-20 | Discharge: 2014-11-23 | Disposition: A | Discharge status: Home / Self Care | Payer: Self-pay

## 2014-11-20 DIAGNOSIS — E119 Type 2 diabetes mellitus without complications: Secondary | ICD-10-CM | POA: Insufficient documentation

## 2014-11-20 DIAGNOSIS — Z794 Long term (current) use of insulin: Secondary | ICD-10-CM | POA: Insufficient documentation

## 2014-11-20 DIAGNOSIS — Z8619 Personal history of other infectious and parasitic diseases: Secondary | ICD-10-CM | POA: Insufficient documentation

## 2014-11-20 DIAGNOSIS — F1994 Other psychoactive substance use, unspecified with psychoactive substance-induced mood disorder: Secondary | ICD-10-CM | POA: Diagnosis present

## 2014-11-20 DIAGNOSIS — Z79899 Other long term (current) drug therapy: Secondary | ICD-10-CM | POA: Insufficient documentation

## 2014-11-20 DIAGNOSIS — Z862 Personal history of diseases of the blood and blood-forming organs and certain disorders involving the immune mechanism: Secondary | ICD-10-CM | POA: Insufficient documentation

## 2014-11-20 DIAGNOSIS — F10229 Alcohol dependence with intoxication, unspecified: Secondary | ICD-10-CM | POA: Diagnosis present

## 2014-11-20 DIAGNOSIS — R45851 Suicidal ideations: Secondary | ICD-10-CM

## 2014-11-20 DIAGNOSIS — F329 Major depressive disorder, single episode, unspecified: Secondary | ICD-10-CM | POA: Insufficient documentation

## 2014-11-20 DIAGNOSIS — F419 Anxiety disorder, unspecified: Secondary | ICD-10-CM | POA: Insufficient documentation

## 2014-11-20 DIAGNOSIS — Z720 Tobacco use: Secondary | ICD-10-CM | POA: Insufficient documentation

## 2014-11-20 LAB — COMPREHENSIVE METABOLIC PANEL
ALK PHOS: 178 U/L — AB (ref 38–126)
ALT: 111 U/L — AB (ref 17–63)
AST: 154 U/L — ABNORMAL HIGH (ref 15–41)
Albumin: 3.6 g/dL (ref 3.5–5.0)
Anion gap: 12 (ref 5–15)
BUN: 10 mg/dL (ref 6–20)
CO2: 27 mmol/L (ref 22–32)
Calcium: 8.6 mg/dL — ABNORMAL LOW (ref 8.9–10.3)
Chloride: 92 mmol/L — ABNORMAL LOW (ref 101–111)
Creatinine, Ser: 0.79 mg/dL (ref 0.61–1.24)
GFR calc Af Amer: >60 mL/min (ref >60)
GFR calc non Af Amer: >60 mL/min (ref >60)
GLUCOSE: 319 mg/dL — AB (ref 65–99)
POTASSIUM: 4.5 mmol/L (ref 3.5–5.1)
SODIUM: 131 mmol/L — AB (ref 135–145)
TOTAL PROTEIN: 8 g/dL (ref 6.5–8.1)
Total Bilirubin: 0.6 mg/dL (ref 0.3–1.2)

## 2014-11-20 LAB — CBG MONITORING, ED
GLUCOSE-CAPILLARY: 134 mg/dL — AB (ref 65–99)
Glucose-Capillary: 433 mg/dL — ABNORMAL HIGH (ref 65–99)
Glucose-Capillary: 142 mg/dL — ABNORMAL HIGH (ref 65–99)

## 2014-11-20 LAB — CBC WITH DIFFERENTIAL/PLATELET
Basophils Absolute: 0 10*3/uL (ref 0.0–0.1)
Basophils Relative: 1 % (ref 0–1)
EOS ABS: 0.1 10*3/uL (ref 0.0–0.7)
Eosinophils Relative: 2 % (ref 0–5)
HCT: 37.1 % — ABNORMAL LOW (ref 39.0–52.0)
Hemoglobin: 13.3 g/dL (ref 13.0–17.0)
LYMPHS PCT: 55 % — AB (ref 12–46)
Lymphs Abs: 2.8 10*3/uL (ref 0.7–4.0)
MCH: 34.4 pg — AB (ref 26.0–34.0)
MCHC: 35.8 g/dL (ref 30.0–36.0)
MCV: 95.9 fL (ref 78.0–100.0)
Monocytes Absolute: 0.6 10*3/uL (ref 0.1–1.0)
Monocytes Relative: 12 % (ref 3–12)
Neutro Abs: 1.6 10*3/uL — ABNORMAL LOW (ref 1.7–7.7)
Neutrophils Relative %: 31 % — ABNORMAL LOW (ref 43–77)
Platelets: 59 10*3/uL — ABNORMAL LOW (ref 150–400)
RBC: 3.87 MIL/uL — ABNORMAL LOW (ref 4.22–5.81)
RDW: 13 % (ref 11.5–15.5)
WBC: 5.1 10*3/uL (ref 4.0–10.5)

## 2014-11-20 LAB — RAPID URINE DRUG SCREEN, HOSP PERFORMED
Amphetamines: NOT DETECTED
Barbiturates: NOT DETECTED
Benzodiazepines: NOT DETECTED
COCAINE: NOT DETECTED
Opiates: NOT DETECTED
TETRAHYDROCANNABINOL: NOT DETECTED

## 2014-11-20 LAB — ETHANOL: Alcohol, Ethyl (B): 330 mg/dL (ref <5)

## 2014-11-20 MED ORDER — INSULIN ASPART 100 UNIT/ML ~~LOC~~ SOLN
4.0000 [IU] | Freq: Three times a day (TID) | SUBCUTANEOUS | Status: DC
  Administered 2014-11-20 – 2014-11-23 (×9): 4 [IU] via SUBCUTANEOUS
  Filled 2014-11-20 (×2): qty 1

## 2014-11-20 MED ORDER — INSULIN ASPART 100 UNIT/ML ~~LOC~~ SOLN
0.0000 [IU] | Freq: Three times a day (TID) | SUBCUTANEOUS | Status: DC
  Administered 2014-11-20: 2 [IU] via SUBCUTANEOUS
  Administered 2014-11-21: 3 [IU] via SUBCUTANEOUS
  Administered 2014-11-21: 5 [IU] via SUBCUTANEOUS
  Administered 2014-11-21: 3 [IU] via SUBCUTANEOUS
  Administered 2014-11-22 (×2): 5 [IU] via SUBCUTANEOUS
  Administered 2014-11-22: 2 [IU] via SUBCUTANEOUS
  Administered 2014-11-23: 5 [IU] via SUBCUTANEOUS
  Administered 2014-11-23: 3 [IU] via SUBCUTANEOUS
  Filled 2014-11-20 (×5): qty 1

## 2014-11-20 MED ORDER — INSULIN ASPART 100 UNIT/ML ~~LOC~~ SOLN
0.0000 [IU] | Freq: Once | SUBCUTANEOUS | Status: AC
  Administered 2014-11-20: 15 [IU] via SUBCUTANEOUS
  Filled 2014-11-20: qty 1

## 2014-11-20 MED ORDER — INSULIN ASPART 100 UNIT/ML ~~LOC~~ SOLN
0.0000 [IU] | Freq: Every day | SUBCUTANEOUS | Status: DC
  Administered 2014-11-22: 2 [IU] via SUBCUTANEOUS
  Filled 2014-11-20: qty 1

## 2014-11-20 MED ORDER — ONDANSETRON HCL 4 MG PO TABS
4.0000 mg | ORAL_TABLET | Freq: Three times a day (TID) | ORAL | Status: DC | PRN
  Administered 2014-11-21 (×2): 4 mg via ORAL
  Filled 2014-11-20 (×2): qty 1

## 2014-11-20 MED ORDER — LORAZEPAM 1 MG PO TABS
0.0000 mg | ORAL_TABLET | Freq: Two times a day (BID) | ORAL | Status: DC
  Administered 2014-11-22 (×2): 2 mg via ORAL
  Administered 2014-11-23: 1 mg via ORAL
  Filled 2014-11-20 (×2): qty 2
  Filled 2014-11-20: qty 1

## 2014-11-20 MED ORDER — METFORMIN HCL 500 MG PO TABS
1000.0000 mg | ORAL_TABLET | Freq: Two times a day (BID) | ORAL | Status: DC
  Administered 2014-11-21 – 2014-11-23 (×5): 1000 mg via ORAL
  Filled 2014-11-20 (×7): qty 2

## 2014-11-20 MED ORDER — METFORMIN HCL 500 MG PO TABS
1000.0000 mg | ORAL_TABLET | Freq: Two times a day (BID) | ORAL | Status: DC
  Administered 2014-11-20: 1000 mg via ORAL
  Filled 2014-11-20 (×2): qty 2

## 2014-11-20 MED ORDER — LORAZEPAM 1 MG PO TABS
0.0000 mg | ORAL_TABLET | Freq: Four times a day (QID) | ORAL | Status: AC
  Administered 2014-11-20: 1 mg via ORAL
  Administered 2014-11-20: 2 mg via ORAL
  Administered 2014-11-20 – 2014-11-21 (×2): 1 mg via ORAL
  Administered 2014-11-21: 2 mg via ORAL
  Administered 2014-11-21: 1 mg via ORAL
  Administered 2014-11-21: 2 mg via ORAL
  Filled 2014-11-20: qty 1
  Filled 2014-11-20: qty 2
  Filled 2014-11-20 (×4): qty 1
  Filled 2014-11-20: qty 2
  Filled 2014-11-20: qty 1

## 2014-11-20 NOTE — ED Notes (Signed)
Pt brought to unit with a CBG of 433. Called EDP and reported requesting orders. Dr. Nena Jordan is evaluating for orders.

## 2014-11-20 NOTE — ED Notes (Signed)
Pt states a couple hours ago he started to feel SI, denies HI, denies plan, ETOH on board.

## 2014-11-20 NOTE — ED Notes (Signed)
Called Dr. Johny Blamer EDP about covering 433 blood sugar. Per MD, give 15 units with lunch today for coverage.

## 2014-11-20 NOTE — ED Provider Notes (Signed)
6:55 AM  D/w Izora Gala with TTS.  They will re-eval later today when sober.  [place], DO 11/20/14 [phone]

## 2014-11-20 NOTE — ED Notes (Signed)
Pt reports passive si thoughts to jump off of a bridge. He states "I don't want to kill myself." "I would have not come here if I did." "I want medical help." Pt blames his employer for his recent relapse because he gives three weeks off twice a year with unpaid vacation that triggers his relapse.

## 2014-11-20 NOTE — ED Notes (Signed)
Pt AAO x 3, no distress noted, calm & cooperative, remains SI, monitoring for safety, Q 15 min checks in effect.
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
## 2014-11-20 NOTE — BH Assessment (Addendum)
Reviewed ED notes prior to initiating assessment. Pt with hx of etoh abuse, presenting to ED intoxicated BAL 330 and voicing SI with plan to jump from bridge.   Requested cart be placed with pt for assessment.   Assessment to commence shortly.   First attempt, unable to connect.   0627 second attempt did not connect, cart was not answered when this writer called it.   0634 third attempt, no answer.   Informed Jennifer at [facility] and she will check on it.    Lear Ng, [facility] Triage Specialist 11/20/2014 6:24 AM

## 2014-11-20 NOTE — Progress Notes (Signed)
EDCM spoke to patient at bedside. Patient confirms he does not have a pcp or insurance living in [place].  EDCM provide patient with pamphlet to [facility], informed patient of services there and walk in times.  EDCM also provided patient with list of pcps who accept self pay patients, list of discount pharmacies and websites needymeds.org and GoodRX.com for medication assistance, phone number to inquire about the orange card, phone number to inquire about [employer], phone number to inquire about the [place], financial resources in the community such as local churches, [employer], urban ministries, and dental assistance for uninsured patients. Patient reports he currently is going to the [facility]. Patient reports he receives all of his medications there for free (insulin, test strips).  Patient reports, he wants to get his orange card renewed.  Patient reports he is "not far from homeless." Patient thankful for resources.  No further EDCM needs at this time.
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
## 2014-11-20 NOTE — ED Provider Notes (Signed)
CSN: [id number]     Arrival date & time 11/20/14  0335 History   First MD Initiated Contact with Patient 11/20/14 0353     Chief Complaint  Patient presents with  . Suicidal     (Consider location/radiation/quality/duration/timing/severity/associated sxs/prior Treatment) HPI Comments: The patient who is well known to the emergency department presents acutely intoxicated verbalizing suicidal thoughts. He states he would jump from a bridge. He has not done anything to cause self harm. He denies HI, hallucinations.   The history is provided by the patient. No language interpreter was used.    Past Medical History  Diagnosis Date  . Hepatitis C   . Diabetes mellitus   . Chronic hyponatremia     pt denies having   . Alcoholic   . Alcoholic cirrhosis of liver     Pt calls this a false diagnosis. Pt denies  . Thrombocytopenia   . Hyperglycemia   . Depression   . Anxiety    Past Surgical History  Procedure Laterality Date  . Plate in lower right leg  2008  . Fracture surgery  right lower leg plate placed years ago   Family History  Problem Relation Age of Onset  . ALS Father    History  Substance Use Topics  . Smoking status: Current Every Day Smoker -- 1.00 packs/day for 40 years    Types: Cigarettes  . Smokeless tobacco: Never Used  . Alcohol Use: Yes     Comment: 19/daily    Review of Systems  Constitutional: Negative for fever and chills.  HENT: Negative.   Respiratory: Negative.   Cardiovascular: Negative.   Gastrointestinal: Negative.   Musculoskeletal: Negative.   Skin: Negative.   Neurological: Negative.   Psychiatric/Behavioral: Positive for suicidal ideas.      Allergies  Benadryl; Tegretol; Librium; and Sulfa antibiotics  Home Medications   Prior to Admission medications   Medication Sig Start Date End Date Taking? Authorizing Provider  carbamazepine (TEGRETOL) 200 MG tablet 847m PO QD X 1D, then 6089mPO QD X 1D, then 40095mD X 1D, then 200m84mO QD X 2D Patient taking differently: Take 200-800 mg by mouth See admin instructions. 800mg79mQD X 1D, then 600mg 31mD X 1D, then 400mg Q22m1D, then 200mg PO1mX 2D 11/06/14   ChristopOrpah Greekbapentin (NEURONTIN) 600 MG tablet Take 600 mg by mouth 3 (three) times daily.    Historical Provider, MD  insulin NPH Human (HUMULIN N,NOVOLIN N) 100 UNIT/ML injection Inject 8 Units into the skin 2 (two) times daily.    Historical Provider, MD  metFORMIN (GLUCOPHAGE) 1000 MG tablet Take 1 tablet (1,000 mg total) by mouth 2 (two) times daily with a meal. 01/15/14   Shuvon B Rankin, NP  ondansetron (ZOFRAN ODT) 4 MG disintegrating tablet 4mg ODT 74mhours prn nausea/vomit Patient not taking: Reported on 11/20/2014 11/08/14   Benjamin Comer LocketBP 126/88 mmHg  Pulse 89  Temp(Src) 98.2 F (36.8 C) (Oral)  Resp 18  SpO2 94% Physical Exam  Constitutional: He is oriented to person, place, and time. He appears well-developed and well-nourished.  HENT:  Head: Normocephalic.  Neck: Normal range of motion. Neck supple.  Cardiovascular: Normal rate and regular rhythm.   Pulmonary/Chest: Effort normal and breath sounds normal.  Abdominal: Soft. Bowel sounds are normal. There is no tenderness. There is no rebound and no guarding.  Musculoskeletal: Normal range of motion.  Neurological: He is  alert and oriented to person, place, and time.  Skin: Skin is warm and dry. No rash noted.  Psychiatric: He expresses suicidal ideation.    ED Course  Procedures (including critical care time) Labs Review Labs Reviewed  URINE RAPID DRUG SCREEN, HOSP PERFORMED  COMPREHENSIVE METABOLIC PANEL  CBC WITH DIFFERENTIAL/PLATELET  ETHANOL    Imaging Review No results found.   EKG Interpretation None      MDM   Final diagnoses:  None    1. Suicidal ideation 2. Alcohol intoxication 3. Alcohol dependence  The patient reports being suicidal and will be evaluated by TTS consultation to  determine disposition.     Charlann Lange, PA-C 11/20/14 [place], DO 11/20/14 [phone]

## 2014-11-20 NOTE — ED Notes (Signed)
TTS doing consult

## 2014-11-20 NOTE — BH Assessment (Signed)
[place] Assessment Progress Note  The following facilities have been contacted to seek placement for this pt, with results as noted:  Beds available, information sent, decision pending:  [facility]  At capacity:  [facility], [place] Triage Specialist [phone]

## 2014-11-21 LAB — CBG MONITORING, ED
GLUCOSE-CAPILLARY: 185 mg/dL — AB (ref 65–99)
Glucose-Capillary: 185 mg/dL — ABNORMAL HIGH (ref 65–99)
Glucose-Capillary: 233 mg/dL — ABNORMAL HIGH (ref 65–99)
Glucose-Capillary: 192 mg/dL — ABNORMAL HIGH (ref 65–99)

## 2014-11-21 MED ORDER — LOPERAMIDE HCL 2 MG PO CAPS
2.0000 mg | ORAL_CAPSULE | ORAL | Status: DC | PRN
  Administered 2014-11-21 (×2): 2 mg via ORAL
  Filled 2014-11-21 (×2): qty 1

## 2014-11-21 MED ORDER — FLUOXETINE HCL 10 MG PO CAPS
10.0000 mg | ORAL_CAPSULE | Freq: Every day | ORAL | Status: DC
  Filled 2014-11-21 (×3): qty 1

## 2014-11-21 NOTE — ED Notes (Signed)
Up to the bathroom

## 2014-11-21 NOTE — ED Notes (Signed)
Pt reports diarrhea x2 episodes.  EDP updated

## 2014-11-21 NOTE — BH Assessment (Addendum)
[place] Assessment Progress Note  The following facilities have been contacted to seek placement for this pt, with results as noted:  Beds available, information sent, decision pending:  [facility]   Declined:  [facility]   At capacity:  [facility] [place], [place] Triage Specialist [phone]
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
## 2014-11-21 NOTE — ED Notes (Signed)
Pt easily arouseable to verbal stimuli, no acute distress noted.  Monitoring for safety, Q 15 min checks in effect.

## 2014-11-21 NOTE — ED Notes (Signed)
Pt reports episode of diarrhea

## 2014-11-21 NOTE — ED Provider Notes (Signed)
10:31 AM Patient complaining of loose stools (no blood - 5 in total). VSS. Will give imodium  Sherwood Gambler, MD 11/21/14 1031

## 2014-11-21 NOTE — Progress Notes (Addendum)
Pt seen by [facility] staff and given resources today  And  CM discussed and provided written information for uninsured accepting pcps, discussed the importance of pcp vs EDP services for f/u care, www.needymeds.org, www.goodrx.com, discounted pharmacies and other [employer] such as [employer] [facility] [employer], affordable care act, financial assistance, uninsured dental services, [place] med assist, DSS and  health department  Reviewed resources for [employer] uninsured accepting pcps like [facility], family medicine at [employer], [facility] [place], [facility], local urgent care centers, [facility], [facility] family practice, general medical clinics, family services of the [place], [facility] urgent care plus others, medication resources, [facility] out patient pharmacies and housing Pt voiced understanding and appreciation of resources provided

## 2014-11-22 DIAGNOSIS — F1994 Other psychoactive substance use, unspecified with psychoactive substance-induced mood disorder: Secondary | ICD-10-CM

## 2014-11-22 DIAGNOSIS — R45851 Suicidal ideations: Secondary | ICD-10-CM

## 2014-11-22 LAB — CBG MONITORING, ED
GLUCOSE-CAPILLARY: 229 mg/dL — AB (ref 65–99)
Glucose-Capillary: 244 mg/dL — ABNORMAL HIGH (ref 65–99)
Glucose-Capillary: 206 mg/dL — ABNORMAL HIGH (ref 65–99)
Glucose-Capillary: 247 mg/dL — ABNORMAL HIGH (ref 65–99)

## 2014-11-22 NOTE — BHH Counselor (Signed)
This Probation officer contacted the following facilities to check review status for patient placement:  [facility] stated that she does not have a referral for this patient, and states that they are at capacity as of 11/22/2014, but instructed this Probation officer to resend supporting documentation for placement to be reviewed by the psychiatrist in the morning on 11/23/2014.  [facility] Adult nurse was transferred to the voice mail of Insurance risk surveyor ([employer]). This writer left a detailed message in regards to placement for this patient along with a contact number.  [facility] Adult nurse was transferred to [employer] in the assessment department and was informed that the referral has not been reviewed due to their emergency department being placed as a priority, but she would provide [place] with a return call after 11:30pm in regards to a placement decision for this patient.  [facility] spoke with Linus Orn who verbalized that the referral has not been reviewed and stated that it may be in the morning before a decision can be given.  [facility]-This Probation officer was informed by Jenny Reichmann in the intake department that they don't have a referral for this patient, and requested that it be resent for review. This Probation officer resent supporting documentation for placement to be reviewed by the intake department.  [facility] [employer] was informed by [employer] in the intake department that she has not had a chance to review this patient.  [facility] Adult nurse was informed by Janeann Forehand they are currently at capacity due to their ER being full, but suggested that this writer resend over supporting documentation for this patient for review.This Probation officer resent supporting documentation for placement to be reviewed by the intake department.  [facility]-This Probation officer was informed by Phil[employer]ina Investment banker, corporate) that this patient's referral has not been reviewed and is still pending.  [facility]-This Probation officer was informed  by Joelene Millin in intake that they don't have a referral for this patient, and requested that it be resent for review. This Probation officer resent supporting documentation for placement to be reviewed by the intake department.  [facility]-This write spoke with Amy (LPN) who stated that she is unaware that this referral has been reviewed, but stated that they are currently not accepting any more patients on tonight (11/22/2014).   [facility]-This writer was informed by Claudine that the psychiatrist will be in at [facility] in the morning to start reviewing referrals for placement.  [facility]-This Probation officer was informed by Arville Go that they are currently at capacity and are not expecting any discharges on tomorrow (11/22/2014).

## 2014-11-22 NOTE — ED Notes (Signed)
Pt. Is alert and oriented.  He denies SI at this time.  He stated "now that im getting help everything is looking up"  He refuses all psyche meds except ativan.  He stated " I will let my family doctor take care of those after I detox"  I explained that the psychiatrist were here for that reason.  He contracts for safety.  He is sleeping most of the day and not interested in showering.  15 minute checks are in place.

## 2014-11-22 NOTE — Consult Note (Signed)
[facility] Consult   Reason for Consult:  Alcohol intoxication; suicidal ideation Referring Physician:  EDP Patient Identification: [name] MRN:  [id number] Principal Diagnosis: Substance induced mood disorder Diagnosis:   Patient Active Problem List   Diagnosis Date Noted  . Suicidal ideation [R45.851] 11/20/2014    Priority: High  . Substance induced mood disorder [F19.94] 06/20/2013    Priority: High  . Alcohol dependence with acute alcoholic intoxication [F81.017] 11/06/2011    Priority: High  . Anxiousness [F41.9] 09/11/2013  . S/P alcohol detoxification [Z09] 09/27/2012  . Alcohol dependence [F10.20] 01/29/2012  . Type II or unspecified type diabetes mellitus without mention of complication, uncontrolled [E11.65] 11/11/2011  . Thrombocytopenia [D69.6]   . Hyperglycemia [R73.9]   . Alcoholic [P10.25]   . Chronic hyponatremia [E87.1]   . Diabetes mellitus [E11.9] 11/06/2011  . Hepatitis C [B19.20] 11/06/2011    Total Time spent with patient: 30 minutes  Subjective:   [name] is a 59 y.o. male patient admitted with   HPI:  I have reviewed and concur with HPI as below, modified: [name] continues to endorse suicidal ideations and depression with hopelessness.  He is experiencing some nausea from alcohol withdrawal, ativan due and given.  Inpatient placement continues to be sought.  Past Medical History:  Past Medical History  Diagnosis Date  . Hepatitis C   . Diabetes mellitus   . Chronic hyponatremia     pt denies having   . Alcoholic   . Alcoholic cirrhosis of liver     Pt calls this a false diagnosis. Pt denies  . Thrombocytopenia   . Hyperglycemia   . Depression   . Anxiety     Past Surgical History  Procedure Laterality Date  . Plate in lower right leg  2008  . Fracture surgery  right lower leg plate placed years ago   Family History:  Family History  Problem Relation Age of Onset  . ALS Father     Social History:  History  Alcohol Use  . Yes    Comment: 19/daily     History  Drug Use No    History   Social History  . Marital Status: Divorced    Spouse Name: N/A  . Number of Children: N/A  . Years of Education: N/A   Social History Main Topics  . Smoking status: Current Every Day Smoker -- 1.00 packs/day for 40 years    Types: Cigarettes  . Smokeless tobacco: Never Used  . Alcohol Use: Yes     Comment: 18/daily  . Drug Use: No  . Sexual Activity: Not Currently   Other Topics Concern  . None   Social History Narrative   Additional Social History:    Pain Medications: see PTA, denies abuse  Prescriptions: See PTA, reports he has not been taking his diabetic medications, and has been off his MH medications Over the Counter: See PTA  History of alcohol / drug use?: Yes Longest period of sobriety (when/how long): unknown, reports no hx of seizure Negative Consequences of Use: Financial, Personal relationships Withdrawal Symptoms:  (reports stomach pain) Name of Substance 1: etoh  1 - Age of First Use: 14 1 - Amount (size/oz): 4- forty ounce beers  1 - Frequency: daily  1 - Duration: on and off for years  1 - Last Use / Amount: 11-19-14                   Allergies:  Allergies  Allergen Reactions  . Benadryl [Diphenhydramine Hcl] Other (See Comments)    "Causes nervousness"  . Tegretol [Carbamazepine] Other (See Comments)    "almost killed me"  . Librium [Chlordiazepoxide Hcl] Anxiety  . Sulfa Antibiotics Rash    Labs:  Results for orders placed or performed during the hospital encounter of 11/20/14 (from the past 48 hour(s))  CBG monitoring, ED     Status: Abnormal   Collection Time: 11/20/14  5:51 PM  Result Value Ref Range   Glucose-Capillary 134 (H) 65 - 99 mg/dL  CBG monitoring, ED     Status: Abnormal   Collection Time: 11/20/14  9:03 PM  Result Value Ref Range   Glucose-Capillary 142 (H) 65 - 99 mg/dL  CBG monitoring, ED      Status: Abnormal   Collection Time: 11/21/14  8:03 AM  Result Value Ref Range   Glucose-Capillary 185 (H) 65 - 99 mg/dL  CBG monitoring, ED     Status: Abnormal   Collection Time: 11/21/14 12:39 PM  Result Value Ref Range   Glucose-Capillary 233 (H) 65 - 99 mg/dL  CBG monitoring, ED     Status: Abnormal   Collection Time: 11/21/14  5:55 PM  Result Value Ref Range   Glucose-Capillary 185 (H) 65 - 99 mg/dL  CBG monitoring, ED     Status: Abnormal   Collection Time: 11/21/14  8:46 PM  Result Value Ref Range   Glucose-Capillary 192 (H) 65 - 99 mg/dL   Comment 1 Notify RN    Comment 2 Document in Chart   CBG monitoring, ED     Status: Abnormal   Collection Time: 11/22/14  8:06 AM  Result Value Ref Range   Glucose-Capillary 244 (H) 65 - 99 mg/dL    Vitals: Blood pressure 148/84, pulse 86, temperature 98.4 F (36.9 C), temperature source Oral, resp. rate 18, SpO2 98 %.  Risk to Self: Suicidal Ideation: Yes-Currently Present Suicidal Intent: Yes-Currently Present Is patient at risk for suicide?: Yes Suicidal Plan?: Yes-Currently Present Specify Current Suicidal Plan: multiple plans, jump off bridge, go in front of train, pills, or shoot himself, "something quick and complete" Access to Means: Yes Specify Access to Suicidal Means: bridge What has been your use of drugs/alcohol within the last 12 months?: Pt has been abusing etoh drinking 4-forty ounce beers per day  How many times?: 0 Other Self Harm Risks: none Triggers for Past Attempts: None known Intentional Self Injurious Behavior: None Risk to Others: Homicidal Ideation: No Thoughts of Harm to Others: No Current Homicidal Intent: No Current Homicidal Plan: No Access to Homicidal Means: No Identified Victim: none History of harm to others?: No Assessment of Violence: None Noted Violent Behavior Description: none Does patient have access to weapons?: No Criminal Charges Pending?: No Does patient have a court date:  No Prior Inpatient Therapy: Prior Inpatient Therapy: Yes Prior Therapy Dates: multiple Prior Therapy Facilty/Provider(s): [place], [facility] Reason for Treatment: depression, SA, SI  Prior Outpatient Therapy: Prior Outpatient Therapy: No Prior Therapy Dates: NA Prior Therapy Facilty/Provider(s): NA Reason for Treatment: NA Does patient have an ACCT team?: No Does patient have Intensive In-House Services?  : No Does patient have [facility] services? : No Does patient have P4CC services?: No  Current Facility-Administered Medications  Medication Dose Route Frequency Provider Last Rate Last Dose  . FLUoxetine (PROZAC) capsule 10 mg  10 mg Oral Daily Charlisa Cham   10 mg at 11/21/14 1308  . insulin aspart (novoLOG) injection 0-15 Units  0-15 Units Subcutaneous TID WC Blanchie Dessert, MD   5 Units at 11/22/14 [phone]  . insulin aspart (novoLOG) injection 0-5 Units  0-5 Units Subcutaneous QHS Blanchie Dessert, MD   0 Units at 11/20/14 2117  . insulin aspart (novoLOG) injection 4 Units  4 Units Subcutaneous TID WC Blanchie Dessert, MD   4 Units at 11/22/14 0824  . loperamide (IMODIUM) capsule 2 mg  2 mg Oral PRN Sherwood Gambler, MD   2 mg at 11/21/14 1530  . LORazepam (ATIVAN) tablet 0-4 mg  0-4 mg Oral Q12H Shari Upstill, PA-C   2 mg at 11/22/14 1022  . metFORMIN (GLUCOPHAGE) tablet 1,000 mg  1,000 mg Oral BID WC Noemi Chapel, MD   1,000 mg at 11/22/14 0818  . ondansetron (ZOFRAN) tablet 4 mg  4 mg Oral Q8H PRN Charlann Lange, PA-C   4 mg at 11/21/14 2316   Current Outpatient Prescriptions  Medication Sig Dispense Refill  . carbamazepine (TEGRETOL) 200 MG tablet 861m PO QD X 1D, then 6057mPO QD X 1D, then 40060mD X 1D, then 200m50m QD X 2D (Patient taking differently: Take 200-800 mg by mouth See admin instructions. 800mg27mQD X 1D, then 600mg 34mD X 1D, then 400mg Q61m1D, then 200mg PO62mX 2D) 11 tablet 0  . gabapentin (NEURONTIN) 600 MG tablet Take 600 mg by mouth 3 (three) times daily.    .  insulin NPH Human (HUMULIN N,NOVOLIN N) 100 UNIT/ML injection Inject 8 Units into the skin 2 (two) times daily.    . metFORMIN (GLUCOPHAGE) 1000 MG tablet Take 1 tablet (1,000 mg total) by mouth 2 (two) times daily with a meal. 28 tablet 0  . ondansetron (ZOFRAN ODT) 4 MG disintegrating tablet 4mg ODT 23mhours prn nausea/vomit (Patient not taking: Reported on 11/20/2014) 4 tablet 0    Musculoskeletal: Strength & Muscle Tone: within normal limits Gait & Station: normal Patient leans: N/A  Psychiatric Specialty Exam: Physical Exam  Review of Systems  Psychiatric/Behavioral: Positive for depression, suicidal ideas and substance abuse. The patient is nervous/anxious and has insomnia.   All other systems reviewed and are negative.   Blood pressure 148/84, pulse 86, temperature 98.4 F (36.9 C), temperature source Oral, resp. rate 18, SpO2 98 %.There is no weight on file to calculate BMI.  General Appearance: Disheveled  Eye Contact::Sport and exercise psychologist Speech:  Clear and Coherent  Volume:  Increased  Mood:  Angry, Anxious and Irritable  Affect:  Appropriate, Depressed and Labile  Thought Process:  Circumstantial and Loose  Orientation:  Full (Time, Place, and Person)  Thought Content:  Rumination  Suicidal Thoughts:  Yes.  with intent/plan  Homicidal Thoughts:  No  Memory:  Immediate;   Fair Recent;   Fair Remote;   Fair  Judgement:  Fair  Insight:  Fair  Psychomotor Activity:  Increased  Concentration:  Good  Recall:  Good  Fund of Knowledge:Good  Language: Fair  Akathisia:  No  Handed:    AIMS (if indicated):     Assets:  Desire for Improvement Resilience Social Support  ADL's:  Impaired  Cognition: WNL  Sleep:      Medical Decision Making: New problem, with additional work up planned, Review of Psycho-Social Stressors (1), Review or order clinical lab tests (1), Review of Medication Regimen & Side Effects (2) and Review of New Medication or Change in Dosage (2)  Treatment Plan  Summary: Substance induced mood disorder  Daily contact  with patient to assess and evaluate symptoms and progress in treatment and Medication management  Continue detox with Ativan protocol while we wait for placement. Offer the rest of his medications for his medical conditions.  Disposition: Inpatient hospitalization for stabilization, seek placement   Waylan Boga, PMH-NP 11/22/2014 12:14 PM  Patient seen face-to-face for psychiatric evaluation, chart reviewed and case discussed with the physician extender and developed treatment plan. Reviewed the information documented and agree with the treatment plan. Corena Pilgrim, MD

## 2014-11-22 NOTE — ED Notes (Signed)
Pt. Noted in room. No complaints or concerns voiced. No distress or abnormal behavior noted. Will continue to monitor with security cameras. Q 15 minute rounds continue.

## 2014-11-22 NOTE — ED Notes (Signed)
Pt. Noted sleeping in room. No complaints or concerns voiced. No distress or abnormal behavior noted. Will continue to monitor with security cameras. Q 15 minute rounds continue.

## 2014-11-22 NOTE — BH Assessment (Signed)
[facility] Assessment Progress Note  The following facilities have been contacted to seek placement for this pt, with results as noted:  Beds available, information sent, decision pending:  [facility], [facility]  At capacity:  [facility], [place] Triage Specialist [phone]

## 2014-11-22 NOTE — ED Notes (Signed)
Report received from [facility]. Pt. Alert and oriented in no distress denies SI, HI, AVH and pain.  Pt. Instructed to come to me with problems or concerns.Will continue to monitor for safety via security cameras and Q 15 minute checks.

## 2014-11-22 NOTE — Progress Notes (Signed)
Per Threasa Beards at [facility], regarding referral; at capacity.   Belia Heman, [place] [employer]  [employer] [phone]

## 2014-11-23 DIAGNOSIS — F1010 Alcohol abuse, uncomplicated: Secondary | ICD-10-CM

## 2014-11-23 LAB — CBG MONITORING, ED
GLUCOSE-CAPILLARY: 188 mg/dL — AB (ref 65–99)
Glucose-Capillary: 226 mg/dL — ABNORMAL HIGH (ref 65–99)

## 2014-11-23 MED ORDER — FLUOXETINE HCL 10 MG PO CAPS: 10.0000 mg | ORAL_CAPSULE | Freq: Every day | ORAL | Status: DC

## 2014-11-23 MED ORDER — LORAZEPAM 2 MG PO TABS: 2.0000 mg | ORAL_TABLET | Freq: Two times a day (BID) | ORAL | Status: DC

## 2014-11-23 MED ORDER — LORAZEPAM 2 MG PO TABS: 0.0000 mg | ORAL_TABLET | Freq: Two times a day (BID) | ORAL | Status: DC

## 2014-11-23 MED ORDER — METFORMIN HCL 1000 MG PO TABS: 1000.0000 mg | ORAL_TABLET | Freq: Two times a day (BID) | ORAL | Status: DC

## 2014-11-23 MED ORDER — INSULIN NPH (HUMAN) (ISOPHANE) 100 UNIT/ML ~~LOC~~ SUSP: 8.0000 [IU] | Freq: Two times a day (BID) | SUBCUTANEOUS | Status: DC

## 2014-11-23 MED ORDER — GABAPENTIN 600 MG PO TABS: 600.0000 mg | ORAL_TABLET | Freq: Three times a day (TID) | ORAL | Status: DC

## 2014-11-23 NOTE — ED Notes (Signed)
Pt. Noted sleeping in room. No complaints or concerns voiced. No distress or abnormal behavior noted. Will continue to monitor with security cameras. Q 15 minute rounds continue.

## 2014-11-23 NOTE — ED Notes (Signed)
Pt reports that he has had a bad reaction to prozac in the past.

## 2014-11-23 NOTE — Consult Note (Signed)
[facility] Consult   Reason for Consult:  Alcohol intoxication; suicidal ideation Referring Physician:  EDP Patient Identification: [name] MRN:  [id number] Principal Diagnosis: Substance induced mood disorder Diagnosis:   Patient Active Problem List   Diagnosis Date Noted  . Suicidal ideation [R45.851] 11/20/2014  . Anxiousness [F41.9] 09/11/2013  . Substance induced mood disorder [F19.94] 06/20/2013  . S/P alcohol detoxification [Z09] 09/27/2012  . Alcohol dependence [F10.20] 01/29/2012  . Type II or unspecified type diabetes mellitus without mention of complication, uncontrolled [E11.65] 11/11/2011  . Thrombocytopenia [D69.6]   . Hyperglycemia [R73.9]   . Alcoholic [W54.62]   . Chronic hyponatremia [E87.1]   . Alcohol dependence with acute alcoholic intoxication [V03.500] 11/06/2011  . Diabetes mellitus [E11.9] 11/06/2011  . Hepatitis C [B19.20] 11/06/2011    Total Time spent with patient: 30 minutes  Subjective:   [name] is a 59 y.o. male patient admitted with alcohol abuse  HPI:   [name] is a 59 yo male patient who was seen in the ED for substance abuse.  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  He is stressed about his financial hardship due to lack of work.  In turn, he started to drink again.  He states he had come in through the ED a few times this past month.  He states that he was not offered any meds to help him get better.   He was observed last night and continued on the detox protocol with out event.  He denies any suicidal and homicidal ideations.  He states that he could not hurt his children in that way.  Past Medical History:  Past Medical History  Diagnosis Date  . Hepatitis C   . Diabetes mellitus   . Chronic hyponatremia     pt denies having   . Alcoholic   . Alcoholic cirrhosis of liver     Pt calls this a false diagnosis. Pt denies  . Thrombocytopenia   . Hyperglycemia   . Depression    . Anxiety     Past Surgical History  Procedure Laterality Date  . Plate in lower right leg  2008  . Fracture surgery  right lower leg plate placed years ago   Family History:  Family History  Problem Relation Age of Onset  . ALS Father    Social History:  History  Alcohol Use  . Yes    Comment: 18/daily     History  Drug Use No    History   Social History  . Marital Status: Divorced    Spouse Name: N/A  . Number of Children: N/A  . Years of Education: N/A   Social History Main Topics  . Smoking status: Current Every Day Smoker -- 1.00 packs/day for 40 years    Types: Cigarettes  . Smokeless tobacco: Never Used  . Alcohol Use: Yes     Comment: 18/daily  . Drug Use: No  . Sexual Activity: Not Currently   Other Topics Concern  . None   Social History Narrative   Additional Social History:    Pain Medications: see PTA, denies abuse  Prescriptions: See PTA, reports he has not been taking his diabetic medications, and has been off his MH medications Over the Counter: See PTA  History of alcohol / drug use?: Yes Longest period of sobriety (when/how long): unknown, reports no hx of seizure Negative Consequences of Use: Financial, Personal relationships Withdrawal Symptoms:  (reports stomach pain) Name  of Substance 1: etoh  1 - Age of First Use: 14 1 - Amount (size/oz): 4- forty ounce beers  1 - Frequency: daily  1 - Duration: on and off for years  1 - Last Use / Amount: 11-19-14     Allergies:   Allergies  Allergen Reactions  . Benadryl [Diphenhydramine Hcl] Other (See Comments)    "Causes nervousness"  . Tegretol [Carbamazepine] Other (See Comments)    "almost killed me"  . Librium [Chlordiazepoxide Hcl] Anxiety  . Sulfa Antibiotics Rash    Labs:  Results for orders placed or performed during the hospital encounter of 11/20/14 (from the past 48 hour(s))  CBG monitoring, ED     Status: Abnormal   Collection Time: 11/21/14  5:55 PM  Result Value Ref  Range   Glucose-Capillary 185 (H) 65 - 99 mg/dL  CBG monitoring, ED     Status: Abnormal   Collection Time: 11/21/14  8:46 PM  Result Value Ref Range   Glucose-Capillary 192 (H) 65 - 99 mg/dL   Comment 1 Notify RN    Comment 2 Document in Chart   CBG monitoring, ED     Status: Abnormal   Collection Time: 11/22/14  8:06 AM  Result Value Ref Range   Glucose-Capillary 244 (H) 65 - 99 mg/dL  CBG monitoring, ED     Status: Abnormal   Collection Time: 11/22/14 12:50 PM  Result Value Ref Range   Glucose-Capillary 206 (H) 65 - 99 mg/dL  CBG monitoring, ED     Status: Abnormal   Collection Time: 11/22/14  5:35 PM  Result Value Ref Range   Glucose-Capillary 229 (H) 65 - 99 mg/dL  CBG monitoring, ED     Status: Abnormal   Collection Time: 11/22/14  9:37 PM  Result Value Ref Range   Glucose-Capillary 247 (H) 65 - 99 mg/dL  CBG monitoring, ED     Status: Abnormal   Collection Time: 11/23/14  7:46 AM  Result Value Ref Range   Glucose-Capillary 188 (H) 65 - 99 mg/dL   Comment 1 Document in Chart   CBG monitoring, ED     Status: Abnormal   Collection Time: 11/23/14  1:04 PM  Result Value Ref Range   Glucose-Capillary 226 (H) 65 - 99 mg/dL   Comment 1 Document in Chart     Vitals: Blood pressure 122/67, pulse 87, temperature 98 F (36.7 C), temperature source Oral, resp. rate 16, SpO2 94 %.  Risk to Self: Suicidal Ideation: Yes-Currently Present Suicidal Intent: Yes-Currently Present Is patient at risk for suicide?: Yes Suicidal Plan?: Yes-Currently Present Specify Current Suicidal Plan: multiple plans, jump off bridge, go in front of train, pills, or shoot himself, "something quick and complete" Access to Means: Yes Specify Access to Suicidal Means: bridge What has been your use of drugs/alcohol within the last 12 months?: Pt has been abusing etoh drinking 4-forty ounce beers per day  How many times?: 0 Other Self Harm Risks: none Triggers for Past Attempts: None known Intentional  Self Injurious Behavior: None Risk to Others: Homicidal Ideation: No Thoughts of Harm to Others: No Current Homicidal Intent: No Current Homicidal Plan: No Access to Homicidal Means: No Identified Victim: none History of harm to others?: No Assessment of Violence: None Noted Violent Behavior Description: none Does patient have access to weapons?: No Criminal Charges Pending?: No Does patient have a court date: No Prior Inpatient Therapy: Prior Inpatient Therapy: Yes Prior Therapy Dates: multiple Prior Therapy Facilty/Provider(s): [place], [facility] Reason  for Treatment: depression, SA, SI  Prior Outpatient Therapy: Prior Outpatient Therapy: No Prior Therapy Dates: NA Prior Therapy Facilty/Provider(s): NA Reason for Treatment: NA Does patient have an ACCT team?: No Does patient have Intensive In-House Services?  : No Does patient have [facility] services? : No Does patient have P4CC services?: No  Current Facility-Administered Medications  Medication Dose Route Frequency Provider Last Rate Last Dose  . FLUoxetine (PROZAC) capsule 10 mg  10 mg Oral Daily Mojeed Akintayo   10 mg at 11/21/14 1308  . insulin aspart (novoLOG) injection 0-15 Units  0-15 Units Subcutaneous TID WC Blanchie Dessert, MD   5 Units at 11/23/14 1326  . insulin aspart (novoLOG) injection 0-5 Units  0-5 Units Subcutaneous QHS Blanchie Dessert, MD   2 Units at 11/22/14 2143  . insulin aspart (novoLOG) injection 4 Units  4 Units Subcutaneous TID WC Blanchie Dessert, MD   4 Units at 11/23/14 1327  . loperamide (IMODIUM) capsule 2 mg  2 mg Oral PRN Sherwood Gambler, MD   2 mg at 11/21/14 1530  . LORazepam (ATIVAN) tablet 0-4 mg  0-4 mg Oral Q12H Shari Upstill, PA-C   1 mg at 11/23/14 0956  . metFORMIN (GLUCOPHAGE) tablet 1,000 mg  1,000 mg Oral BID WC Noemi Chapel, MD   1,000 mg at 11/23/14 0828  . ondansetron (ZOFRAN) tablet 4 mg  4 mg Oral Q8H PRN Charlann Lange, PA-C   4 mg at 11/21/14 2316   Current Outpatient Prescriptions   Medication Sig Dispense Refill  . FLUoxetine (PROZAC) 10 MG capsule Take 1 capsule (10 mg total) by mouth daily. 30 capsule 0  . gabapentin (NEURONTIN) 600 MG tablet Take 1 tablet (600 mg total) by mouth 3 (three) times daily. 90 tablet 0  . insulin NPH Human (HUMULIN N,NOVOLIN N) 100 UNIT/ML injection Inject 0.08 mLs (8 Units total) into the skin 2 (two) times daily. 10 mL 0  . LORazepam (ATIVAN) 2 MG tablet Take 1 tablet (2 mg total) by mouth every 12 (twelve) hours. 3 tablet 0  . metFORMIN (GLUCOPHAGE) 1000 MG tablet Take 1 tablet (1,000 mg total) by mouth 2 (two) times daily with a meal. 60 tablet 0    Musculoskeletal: Strength & Muscle Tone: within normal limits Gait & Station: normal Patient leans: N/A  Psychiatric Specialty Exam: Physical Exam  Vitals reviewed.   Review of Systems  Psychiatric/Behavioral: Positive for substance abuse. Negative for depression and suicidal ideas. The patient is nervous/anxious and has insomnia.   All other systems reviewed and are negative.   Blood pressure 122/67, pulse 87, temperature 98 F (36.7 C), temperature source Oral, resp. rate 16, SpO2 94 %.There is no weight on file to calculate BMI.  General Appearance: Disheveled  Eye Sport and exercise psychologist::  Fair  Speech:  Clear and Coherent  Volume:  Increased  Mood:  Angry, Anxious and Irritable  Affect:  Appropriate, Depressed and Labile  Thought Process:  Circumstantial and Loose  Orientation:  Full (Time, Place, and Person)  Thought Content:  Rumination  Suicidal Thoughts:  Yes.  with intent/plan  Homicidal Thoughts:  No  Memory:  Immediate;   Fair Recent;   Fair Remote;   Fair  Judgement:  Fair  Insight:  Fair  Psychomotor Activity:  Increased  Concentration:  Good  Recall:  Good  Fund of Knowledge:Good  Language: Fair  Akathisia:  No  Handed:    AIMS (if indicated):     Assets:  Desire for Improvement Resilience Social Support  ADL's:  Impaired  Cognition: WNL  Sleep:      Medical  Decision Making: New problem, with additional work up planned, Review of Psycho-Social Stressors (1), Review or order clinical lab tests (1), Review of Medication Regimen & Side Effects (2) and Review of New Medication or Change in Dosage (2)  Treatment Plan Summary: Patient has been positively responded to alcohol detox treatment and has no acute withdrawal symptoms and willing to continue taking his medication as outpatient at this time the patient has no evidence of self-injurious behavior, suicidal/homicidal ideation, intention or plans. Patient is also not interested in rehabilitation services but willing to participate outpatient medication management at this time. Substance induced mood disorder  Daily contact with patient to assess and evaluate symptoms and progress in treatment and Medication management  Offered the rest of his medications for his medical conditions. Offered outpatient treatment rehab.  Patient deferred  Disposition: Discharge to [facility], [place] 11/23/2014 3:10 PM    Patient seen face-to-face for this evaluation, case discussed with treatment team and the physician extender and develop treatment plan. Reviewed the information documented and agree with the treatment plan.  Daschel Roughton,JANARDHAHA R. 11/24/2014 11:28 AM

## 2014-11-23 NOTE — [facility] Suicide Risk Assessment (Cosign Needed)
Suicide Risk Assessment  Discharge Assessment   [facility] Discharge Suicide Risk Assessment   Demographic Factors:  Male, Low socioeconomic status, Living alone and Unemployed  Total Time spent with patient: 45 minutes  Musculoskeletal: Strength & Muscle Tone: within normal limits Gait & Station: normal Patient leans: N/A  Psychiatric Specialty Exam:     Blood pressure 152/95, pulse 94, temperature 97.8 F (36.6 C), temperature source Oral, resp. rate 16, SpO2 99 %.There is no weight on file to calculate BMI.  General Appearance: Disheveled  Eye Contact::  Good  Speech:  Normal Rate409  Volume:  Normal  Mood:  Anxious  Affect:  Appropriate  Thought Process:  Coherent  Orientation:  Full (Time, Place, and Person)  Thought Content:  Rumination  Suicidal Thoughts:  No  Homicidal Thoughts:  No  Memory:  Immediate;   Good Recent;   Good Remote;   Good  Judgement:  Poor  Insight:  Lacking  Psychomotor Activity:  Normal  Concentration:  Fair  Recall:  [employer] of Knowledge:Fair  Language: Good  Akathisia:  Negative  Handed:  Right  AIMS (if indicated):     Assets:  Communication Skills Resilience  Sleep:     Cognition: WNL  ADL's:  Intact      Has this patient used any form of tobacco in the last 30 days? (Cigarettes, Smokeless Tobacco, Cigars, and/or Pipes) Yes, A prescription for an FDA-approved tobacco cessation medication was offered at discharge and the patient refused  Mental Status Per Nursing Assessment::   On Admission:     Current Mental Status by Physician: Patient denies SI/HI/AVH  Loss Factors: Financial problems/change in socioeconomic status  Historical Factors: chronic substance abuse  Risk Reduction Factors:   Sense of responsibility to family  Continued Clinical Symptoms:  Severe Anxiety and/or Agitation Alcohol/Substance Abuse/Dependencies  Cognitive Features That Contribute To Risk:  Thought constriction (tunnel vision)    Suicide  Risk:  Minimal: No identifiable suicidal ideation.  Patients presenting with no risk factors but with morbid ruminations; may be classified as minimal risk based on the severity of the depressive symptoms  Principal Problem: Substance induced mood disorder Discharge Diagnoses:  Patient Active Problem List   Diagnosis Date Noted  . Suicidal ideation [R45.851] 11/20/2014  . Anxiousness [F41.9] 09/11/2013  . Substance induced mood disorder [F19.94] 06/20/2013  . S/P alcohol detoxification [Z09] 09/27/2012  . Alcohol dependence [F10.20] 01/29/2012  . Type II or unspecified type diabetes mellitus without mention of complication, uncontrolled [E11.65] 11/11/2011  . Thrombocytopenia [D69.6]   . Hyperglycemia [R73.9]   . Alcoholic [S85.46]   . Chronic hyponatremia [E87.1]   . Alcohol dependence with acute alcoholic intoxication [E70.350] 11/06/2011  . Diabetes mellitus [E11.9] 11/06/2011  . Hepatitis C [B19.20] 11/06/2011    Plan Of Care/Follow-up recommendations:  Activity:  as tol, diet as tol  Is patient on multiple antipsychotic therapies at discharge:  No   Has Patient had three or more failed trials of antipsychotic monotherapy by history:  No  Recommended Plan for Multiple Antipsychotic Therapies: NA  Freda Munro May Deva Ron AGNP-BC 11/23/2014, 1:04 PM

## 2014-11-23 NOTE — ED Notes (Signed)
Up to the bathroom
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
## 2014-11-23 NOTE — ED Notes (Addendum)
Written dc instructions and prescriptions reviewed w/ patient.  Pt encouraged to avoid alcohol, follow up w/ OP treatment services,  take his medications as directed and follow up with his dr.  Abbott Pao verbalized understanding.  Pt ambulatory to dc window w/ mHt, belongings returned after leaving the area. Bus pass given

## 2014-12-01 ENCOUNTER — Emergency Department (HOSPITAL_COMMUNITY)
Admission: EM | Admit: 2014-12-01 | Discharge: 2014-12-02 | Disposition: A | Discharge status: Home / Self Care | Payer: Self-pay | Attending: Emergency Medicine | Admitting: Emergency Medicine

## 2014-12-01 ENCOUNTER — Encounter (HOSPITAL_COMMUNITY): Payer: Self-pay | Admitting: Emergency Medicine

## 2014-12-01 ENCOUNTER — Emergency Department (HOSPITAL_COMMUNITY): Admission: EM | Admit: 2014-12-01 | Discharge: 2014-12-01 | Discharge status: Absent without leave | Payer: Self-pay

## 2014-12-01 DIAGNOSIS — F10220 Alcohol dependence with intoxication, uncomplicated: Secondary | ICD-10-CM | POA: Insufficient documentation

## 2014-12-01 DIAGNOSIS — F329 Major depressive disorder, single episode, unspecified: Secondary | ICD-10-CM | POA: Insufficient documentation

## 2014-12-01 DIAGNOSIS — F1994 Other psychoactive substance use, unspecified with psychoactive substance-induced mood disorder: Secondary | ICD-10-CM

## 2014-12-01 DIAGNOSIS — Z720 Tobacco use: Secondary | ICD-10-CM | POA: Insufficient documentation

## 2014-12-01 DIAGNOSIS — F419 Anxiety disorder, unspecified: Secondary | ICD-10-CM | POA: Insufficient documentation

## 2014-12-01 DIAGNOSIS — F1099 Alcohol use, unspecified with unspecified alcohol-induced disorder: Secondary | ICD-10-CM

## 2014-12-01 DIAGNOSIS — F1024 Alcohol dependence with alcohol-induced mood disorder: Secondary | ICD-10-CM | POA: Insufficient documentation

## 2014-12-01 DIAGNOSIS — Z794 Long term (current) use of insulin: Secondary | ICD-10-CM | POA: Insufficient documentation

## 2014-12-01 DIAGNOSIS — E119 Type 2 diabetes mellitus without complications: Secondary | ICD-10-CM | POA: Insufficient documentation

## 2014-12-01 DIAGNOSIS — Z8619 Personal history of other infectious and parasitic diseases: Secondary | ICD-10-CM | POA: Insufficient documentation

## 2014-12-01 DIAGNOSIS — F10229 Alcohol dependence with intoxication, unspecified: Secondary | ICD-10-CM | POA: Diagnosis present

## 2014-12-01 DIAGNOSIS — Z862 Personal history of diseases of the blood and blood-forming organs and certain disorders involving the immune mechanism: Secondary | ICD-10-CM | POA: Insufficient documentation

## 2014-12-01 DIAGNOSIS — R45851 Suicidal ideations: Secondary | ICD-10-CM

## 2014-12-01 DIAGNOSIS — Z79899 Other long term (current) drug therapy: Secondary | ICD-10-CM | POA: Insufficient documentation

## 2014-12-01 LAB — COMPREHENSIVE METABOLIC PANEL
ALT: 58 U/L (ref 17–63)
ANION GAP: 8 (ref 5–15)
AST: 71 U/L — ABNORMAL HIGH (ref 15–41)
Albumin: 3.6 g/dL (ref 3.5–5.0)
Alkaline Phosphatase: 218 U/L — ABNORMAL HIGH (ref 38–126)
BUN: 10 mg/dL (ref 6–20)
CALCIUM: 8.6 mg/dL — AB (ref 8.9–10.3)
CO2: 28 mmol/L (ref 22–32)
CREATININE: 0.6 mg/dL — AB (ref 0.61–1.24)
Chloride: 96 mmol/L — ABNORMAL LOW (ref 101–111)
GFR calc Af Amer: >60 mL/min (ref >60)
Glucose, Bld: 398 mg/dL — ABNORMAL HIGH (ref 65–99)
Potassium: 4.2 mmol/L (ref 3.5–5.1)
SODIUM: 132 mmol/L — AB (ref 135–145)
TOTAL PROTEIN: 7.8 g/dL (ref 6.5–8.1)
Total Bilirubin: 0.3 mg/dL (ref 0.3–1.2)

## 2014-12-01 LAB — RAPID URINE DRUG SCREEN, HOSP PERFORMED
AMPHETAMINES: NOT DETECTED
Barbiturates: NOT DETECTED
Benzodiazepines: NOT DETECTED
Cocaine: NOT DETECTED
OPIATES: NOT DETECTED
Tetrahydrocannabinol: NOT DETECTED

## 2014-12-01 LAB — CBC
HCT: 33.4 % — ABNORMAL LOW (ref 39.0–52.0)
HEMOGLOBIN: 12 g/dL — AB (ref 13.0–17.0)
MCH: 34.5 pg — AB (ref 26.0–34.0)
MCHC: 35.9 g/dL (ref 30.0–36.0)
MCV: 96 fL (ref 78.0–100.0)
PLATELETS: 114 10*3/uL — AB (ref 150–400)
RBC: 3.48 MIL/uL — AB (ref 4.22–5.81)
RDW: 13 % (ref 11.5–15.5)
WBC: 5.4 10*3/uL (ref 4.0–10.5)

## 2014-12-01 LAB — ETHANOL
ALCOHOL ETHYL (B): 251 mg/dL — AB (ref <5)
Alcohol, Ethyl (B): 345 mg/dL (ref <5)

## 2014-12-01 LAB — CBG MONITORING, ED: Glucose-Capillary: 427 mg/dL — ABNORMAL HIGH (ref 65–99)

## 2014-12-01 MED ORDER — METFORMIN HCL 500 MG PO TABS
1000.0000 mg | ORAL_TABLET | Freq: Two times a day (BID) | ORAL | Status: DC
  Administered 2014-12-01 – 2014-12-02 (×4): 1000 mg via ORAL
  Filled 2014-12-01 (×5): qty 2

## 2014-12-01 MED ORDER — INSULIN NPH (HUMAN) (ISOPHANE) 100 UNIT/ML ~~LOC~~ SUSP: 8.0000 [IU] | Freq: Two times a day (BID) | SUBCUTANEOUS | Status: DC

## 2014-12-01 MED ORDER — ONDANSETRON HCL 4 MG PO TABS: 4.0000 mg | ORAL_TABLET | Freq: Three times a day (TID) | ORAL | Status: DC | PRN

## 2014-12-01 MED ORDER — NICOTINE 21 MG/24HR TD PT24
21.0000 mg | MEDICATED_PATCH | Freq: Every day | TRANSDERMAL | Status: DC
  Filled 2014-12-01: qty 1

## 2014-12-01 MED ORDER — ACETAMINOPHEN 325 MG PO TABS
650.0000 mg | ORAL_TABLET | ORAL | Status: DC | PRN
  Filled 2014-12-01: qty 2

## 2014-12-01 MED ORDER — GABAPENTIN 300 MG PO CAPS
600.0000 mg | ORAL_CAPSULE | Freq: Three times a day (TID) | ORAL | Status: DC
  Administered 2014-12-01 – 2014-12-02 (×5): 600 mg via ORAL
  Filled 2014-12-01 (×5): qty 2

## 2014-12-01 MED ORDER — METFORMIN HCL 500 MG PO TABS
1000.0000 mg | ORAL_TABLET | Freq: Once | ORAL | Status: DC
  Filled 2014-12-01: qty 2

## 2014-12-01 MED ORDER — SODIUM CHLORIDE 0.9 % IV BOLUS (SEPSIS)
1000.0000 mL | Freq: Once | INTRAVENOUS | Status: AC
  Administered 2014-12-01: 1000 mL via INTRAVENOUS

## 2014-12-01 MED ORDER — IBUPROFEN 200 MG PO TABS: 600.0000 mg | ORAL_TABLET | Freq: Three times a day (TID) | ORAL | Status: DC | PRN

## 2014-12-01 MED ORDER — FLUOXETINE HCL 10 MG PO CAPS
10.0000 mg | ORAL_CAPSULE | Freq: Every day | ORAL | Status: DC
  Administered 2014-12-01: 10 mg via ORAL
  Filled 2014-12-01 (×3): qty 1

## 2014-12-01 MED ORDER — LORAZEPAM 1 MG PO TABS
1.0000 mg | ORAL_TABLET | Freq: Three times a day (TID) | ORAL | Status: DC | PRN
  Administered 2014-12-01 – 2014-12-02 (×4): 1 mg via ORAL
  Filled 2014-12-01 (×5): qty 1

## 2014-12-01 MED ORDER — ALUM & MAG HYDROXIDE-SIMETH 200-200-20 MG/5ML PO SUSP: 30.0000 mL | ORAL | Status: DC | PRN

## 2014-12-01 NOTE — Progress Notes (Signed)
This Probation officer spoke with nursing staff and patient is able to be assessed at this time.     Chesley Noon, MSW, LCSW, [place] Clinical Social Worker [phone]

## 2014-12-01 NOTE — ED Notes (Signed)
Pt wants detox from alcohol

## 2014-12-01 NOTE — Progress Notes (Signed)
Disposition CSW completed referrals for patient to the following inpatient detox facilities:  [facility] will continue to assist patient with placement needs as needed.  [facility] Disposition CSW [phone]

## 2014-12-01 NOTE — ED Notes (Addendum)
Pt wanded by security.
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
## 2014-12-01 NOTE — ED Notes (Signed)
Pt ambulated to bathroom without assistance,
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
## 2014-12-01 NOTE — BH Assessment (Signed)
Assessment Note  [name] is an 59 y.o. male. Patient was brought into the ED by EMS because of alcohol detox and suicidal ideations.  Patient currently denies SI/HI, hallucinations, and other self-injurious behaviors.  Patient reports alcohol use, daily, 5 or more 40oz beers, and last drank 7/23.  Patient reports current negative consequences as a result drinking are relational, legal, depression, shakes, cramps, nausea, diarrhea, numbness, irritable, and agitation.    Patient reports he is on probation for felony fraud checks and will complete stipulation in one year.  Patient refused to provide contact information for probation officer.  Patient reports past treatment at [facility], [facility], [facility], [facility].    CSW consulted with Reginold Agent, NP it is recommended to monitor and re-evaluate.    Axis I: Substance Induced Mood Disorder and Alcohol use, severe Axis II: Deferred Axis III:  Past Medical History  Diagnosis Date  . Hepatitis C   . Diabetes mellitus   . Chronic hyponatremia     pt denies having   . Alcoholic   . Alcoholic cirrhosis of liver     Pt calls this a false diagnosis. Pt denies  . Thrombocytopenia   . Hyperglycemia   . Depression   . Anxiety    Axis IV: economic problems, housing problems, occupational problems, other psychosocial or environmental problems, problems related to legal system/crime, problems related to social environment, problems with access to health care services and problems with primary support group Axis V: 41-50 serious symptoms  Past Medical History:  Past Medical History  Diagnosis Date  . Hepatitis C   . Diabetes mellitus   . Chronic hyponatremia     pt denies having   . Alcoholic   . Alcoholic cirrhosis of liver     Pt calls this a false diagnosis. Pt denies  . Thrombocytopenia   . Hyperglycemia   . Depression   . Anxiety     Past Surgical History  Procedure Laterality Date  . Plate in lower right leg  2008  . Fracture surgery   right lower leg plate placed years ago    Family History:  Family History  Problem Relation Age of Onset  . ALS Father     Social History:  reports that he has been smoking Cigarettes.  He has a 40 pack-year smoking history. He has never used smokeless tobacco. He reports that he drinks alcohol. He reports that he does not use illicit drugs.  Additional Social History:  Alcohol / Drug Use Pain Medications: See [facility] Prescriptions: See MARs Over the Counter: See MARs History of alcohol / drug use?: Yes Longest period of sobriety (when/how long): 6 years when children were younger Negative Consequences of Use: Personal relationships, Work / Youth worker, Museum/gallery curator, Scientist, research (physical sciences) Withdrawal Symptoms: Diarrhea, Tingling, Irritability, Tremors, Nausea / Vomiting, Cramps, Agitation Substance #1 Name of Substance 1: Alcohol 1 - Age of First Use: 14 1 - Amount (size/oz): 5 40oz 1 - Frequency: daily 1 - Duration: on/off since 14 1 - Last Use / Amount: 7/23  CIWA: CIWA-Ar BP: 144/75 mmHg Pulse Rate: 93 Nausea and Vomiting: mild nausea with no vomiting Tactile Disturbances: mild itching, pins and needles, burning or numbness Tremor: not visible, but can be felt fingertip to fingertip Auditory Disturbances: not present Paroxysmal Sweats: no sweat visible Visual Disturbances: not present Anxiety: two Headache, Fullness in Head: none present Agitation: somewhat more than normal activity Orientation and Clouding of Sensorium: cannot do serial additions or is uncertain about date CIWA-Ar Total:  8 COWS:    Allergies:  Allergies  Allergen Reactions  . Benadryl [Diphenhydramine Hcl] Other (See Comments)    "Causes nervousness"  . Tegretol [Carbamazepine] Other (See Comments)    "almost killed me"  . Librium [Chlordiazepoxide Hcl] Anxiety  . Sulfa Antibiotics Rash    Home Medications:  (Not in a hospital admission)  OB/GYN Status:  No LMP for male patient.  General Assessment Data Location  of Assessment: [facility]TS Assessment: In system Is this a Tele or Face-to-Face Assessment?: Face-to-Face Is this an Initial Assessment or a Re-assessment for this encounter?: Initial Assessment Marital status: Single Is patient pregnant?: No Pregnancy Status: No Living Arrangements: Other (Comment) (homeless) Can pt return to current living arrangement?: Yes Admission Status: Voluntary Is patient capable of signing voluntary admission?: Yes Referral Source: Self/Family/Friend  Medical Screening Exam ([facility]) Medical Exam completed: Yes  Crisis Care Plan Living Arrangements: Other (Comment) (homeless) Name of Psychiatrist: none Name of Therapist: none  Education Status Is patient currently in school?: No Highest grade of school patient has completed: 3 years of college Name of school: NA Contact person: NA  Risk to self with the past 6 months Suicidal Ideation: No-Not Currently/Within Last 6 Months Has patient been a risk to self within the past 6 months prior to admission? : No Suicidal Intent: No-Not Currently/Within Last 6 Months Has patient had any suicidal intent within the past 6 months prior to admission? : No Is patient at risk for suicide?: No Suicidal Plan?: No Has patient had any suicidal plan within the past 6 months prior to admission? : No Access to Means: No What has been your use of drugs/alcohol within the last 12 months?: alcohol Previous Attempts/Gestures: No How many times?: 1 Triggers for Past Attempts: Family contact Intentional Self Injurious Behavior: None Family Suicide History: Yes Recent stressful life event(s): Conflict (Comment), Loss (Comment), Financial Problems, Legal Issues, Other (Comment) (SA) Persecutory voices/beliefs?: No Depression: Yes Depression Symptoms: Loss of interest in usual pleasures, Guilt, Feeling angry/irritable Substance abuse history and/or treatment for substance abuse?: Yes  Risk to Others within the past 6  months Homicidal Ideation: No-Not Currently/Within Last 6 Months Does patient have any lifetime risk of violence toward others beyond the six months prior to admission? : No Thoughts of Harm to Others: No-Not Currently Present/Within Last 6 Months Current Homicidal Intent: No-Not Currently/Within Last 6 Months Current Homicidal Plan: No-Not Currently/Within Last 6 Months Access to Homicidal Means: No History of harm to others?: No Assessment of Violence: None Noted Does patient have access to weapons?: No Criminal Charges Pending?: Yes Describe Pending Criminal Charges: check fraud Does patient have a court date: No Is patient on probation?: Yes  Psychosis Hallucinations: None noted Delusions: None noted  Mental Status Report Appearance/Hygiene: Disheveled Eye Contact: Fair Motor Activity: Freedom of movement Speech: Logical/coherent Level of Consciousness: Alert Mood: Anxious Affect: Irritable Anxiety Level: Moderate Thought Processes: Relevant Judgement: Impaired Orientation: Person, Place, Time, Situation Obsessive Compulsive Thoughts/Behaviors: None  Cognitive Functioning Concentration: Fair Memory: Recent Intact, Remote Intact IQ: Average Insight: Fair Impulse Control: Fair Appetite: Fair Sleep: No Change Vegetative Symptoms: None  ADLScreening [facility] Assessment Services) Patient's cognitive ability adequate to safely complete daily activities?: Yes Patient able to express need for assistance with ADLs?: Yes Independently performs ADLs?: Yes (appropriate for developmental age)  Prior Inpatient Therapy Prior Inpatient Therapy: Yes Prior Therapy Dates: multiple Prior Therapy Facilty/Provider(s): [facility], [facility] Reason for Treatment: depression, SA, SI   Prior Outpatient Therapy Prior  Outpatient Therapy: No Prior Therapy Dates: NA Prior Therapy Facilty/Provider(s): NA Reason for Treatment: NA Does patient have an ACCT team?: No Does patient have Intensive  In-House Services?  : No Does patient have [facility] services? : No Does patient have P4CC services?: No  ADL Screening (condition at time of admission) Patient's cognitive ability adequate to safely complete daily activities?: Yes Patient able to express need for assistance with ADLs?: Yes Independently performs ADLs?: Yes (appropriate for developmental age)       Abuse/Neglect Assessment (Assessment to be complete while patient is alone) Physical Abuse: Denies Verbal Abuse: Denies Sexual Abuse: Denies Exploitation of patient/patient's resources: Denies Self-Neglect: Denies Values / Beliefs Cultural Requests During Hospitalization: None Spiritual Requests During Hospitalization: None Consults Spiritual Care Consult Needed: No Social Work Consult Needed: No Regulatory affairs officer (For Healthcare) Does patient have an advance directive?: No Would patient like information on creating an advanced directive?: No - patient declined information    Additional Information 1:1 In Past 12 Months?: No CIRT Risk: No Elopement Risk: No Does patient have medical clearance?: Yes     Disposition:  Disposition Initial Assessment Completed for this Encounter: Yes Disposition of Patient: Other dispositions (pending) Other disposition(s): Other (Comment) (pending)  On Site Evaluation by:   Reviewed with Physician:    Chesley Noon A 12/01/2014 9:14 AM

## 2014-12-01 NOTE — ED Provider Notes (Signed)
CSN: [id number]     Arrival date & time 12/01/14  0015 History  This chart was scribed for Jossette Zirbel, MD by Randa Evens, ED Scribe. This patient was seen in room WA25/WA25 and the patient's care was started at 2:06 AM.      Chief Complaint  Patient presents with  . alcohol detox    . Suicidal   Patient is a 59 y.o. male presenting with intoxication. The history is provided by the patient. No language interpreter was used.  Alcohol Intoxication This is a recurrent problem. The current episode started 3 to 5 hours ago. The problem occurs daily. The problem has not changed since onset.Pertinent negatives include no chest pain, no abdominal pain, no headaches and no shortness of breath. Nothing aggravates the symptoms. Nothing relieves the symptoms. He has tried nothing for the symptoms. The treatment provided no relief.   HPI Comments: [name] is a 59 y.o. male brought in by ambulance, who presents to the Emergency Department complaining of alcohol intoxication onset PTA. Pt states that he normally drinks about 6-7 40 oz beers daily. At this time Pt denies HI or SI.    Past Medical History  Diagnosis Date  . Hepatitis C   . Diabetes mellitus   . Chronic hyponatremia     pt denies having   . Alcoholic   . Alcoholic cirrhosis of liver     Pt calls this a false diagnosis. Pt denies  . Thrombocytopenia   . Hyperglycemia   . Depression   . Anxiety    Past Surgical History  Procedure Laterality Date  . Plate in lower right leg  2008  . Fracture surgery  right lower leg plate placed years ago   Family History  Problem Relation Age of Onset  . ALS Father    History  Substance Use Topics  . Smoking status: Current Every Day Smoker -- 1.00 packs/day for 40 years    Types: Cigarettes  . Smokeless tobacco: Never Used  . Alcohol Use: Yes     Comment: 18/daily    Review of Systems  Respiratory: Negative for shortness of breath.   Cardiovascular: Negative for chest  pain.  Gastrointestinal: Negative for abdominal pain.  Neurological: Negative for headaches.  All other systems reviewed and are negative.     Allergies  Benadryl; Tegretol; Librium; and Sulfa antibiotics  Home Medications   Prior to Admission medications   Medication Sig Start Date End Date Taking? Authorizing Provider  FLUoxetine (PROZAC) 10 MG capsule Take 1 capsule (10 mg total) by mouth daily. Patient not taking: Reported on 12/01/2014 11/23/14   Kerrie Buffalo, NP  gabapentin (NEURONTIN) 600 MG tablet Take 1 tablet (600 mg total) by mouth 3 (three) times daily. Patient not taking: Reported on 12/01/2014 11/23/14   Kerrie Buffalo, NP  insulin NPH Human (HUMULIN N,NOVOLIN N) 100 UNIT/ML injection Inject 0.08 mLs (8 Units total) into the skin 2 (two) times daily. Patient not taking: Reported on 12/01/2014 11/23/14   Kerrie Buffalo, NP  LORazepam (ATIVAN) 2 MG tablet Take 1 tablet (2 mg total) by mouth every 12 (twelve) hours. Patient not taking: Reported on 12/01/2014 11/23/14   Kerrie Buffalo, NP  metFORMIN (GLUCOPHAGE) 1000 MG tablet Take 1 tablet (1,000 mg total) by mouth 2 (two) times daily with a meal. Patient not taking: Reported on 12/01/2014 11/23/14   Kerrie Buffalo, NP   BP 114/61 mmHg  Pulse 90  Temp(Src) 98.5 F (36.9 C) (Oral)  Resp 18  SpO2 92%   Physical Exam  Constitutional: He is oriented to person, place, and time. He appears well-developed and well-nourished. No distress.  HENT:  Head: Normocephalic and atraumatic.  Mouth/Throat: Oropharynx is clear and moist.  Eyes: Conjunctivae and EOM are normal. Pupils are equal, round, and reactive to light.  Neck: Normal range of motion. Neck supple. No tracheal deviation present.  Cardiovascular: Normal rate, regular rhythm, normal heart sounds and intact distal pulses.   Pulmonary/Chest: Effort normal and breath sounds normal. No respiratory distress. He has no wheezes. He has no rales.  Abdominal: Soft. Bowel sounds are  normal. There is no tenderness. There is no rebound and no guarding.  Musculoskeletal: Normal range of motion. He exhibits no edema or tenderness.  Neurological: He is alert and oriented to person, place, and time. He has normal reflexes.  Skin: Skin is warm and dry.  Psychiatric: He has a normal mood and affect. His behavior is normal.  Nursing note and vitals reviewed.   ED Course  Procedures (including critical care time) DIAGNOSTIC STUDIES: Oxygen Saturation is 92% on RA, low by my interpretation.    COORDINATION OF CARE: 2:30 AM-Discussed treatment plan with pt at bedside and pt agreed to plan.     Labs Review Labs Reviewed  COMPREHENSIVE METABOLIC PANEL - Abnormal; Notable for the following:    Sodium 132 (*)    Chloride 96 (*)    Glucose, Bld 398 (*)    Creatinine, Ser 0.60 (*)    Calcium 8.6 (*)    AST 71 (*)    Alkaline Phosphatase 218 (*)    All other components within normal limits  ETHANOL - Abnormal; Notable for the following:    Alcohol, Ethyl (B) 345 (*)    All other components within normal limits  CBC - Abnormal; Notable for the following:    RBC 3.48 (*)    Hemoglobin 12.0 (*)    HCT 33.4 (*)    MCH 34.5 (*)    Platelets 114 (*)    All other components within normal limits  CBG MONITORING, ED - Abnormal; Notable for the following:    Glucose-Capillary 427 (*)    All other components within normal limits  URINE RAPID DRUG SCREEN, HOSP PERFORMED    Imaging Review No results found.   EKG Interpretation None      MDM   Final diagnoses:  None   Results for orders placed or performed during the hospital encounter of 12/01/14  Comprehensive metabolic panel  Result Value Ref Range   Sodium 132 (L) 135 - 145 mmol/L   Potassium 4.2 3.5 - 5.1 mmol/L   Chloride 96 (L) 101 - 111 mmol/L   CO2 28 22 - 32 mmol/L   Glucose, Bld 398 (H) 65 - 99 mg/dL   BUN 10 6 - 20 mg/dL   Creatinine, Ser 0.60 (L) 0.61 - 1.24 mg/dL   Calcium 8.6 (L) 8.9 - 10.3  mg/dL   Total Protein 7.8 6.5 - 8.1 g/dL   Albumin 3.6 3.5 - 5.0 g/dL   AST 71 (H) 15 - 41 U/L   ALT 58 17 - 63 U/L   Alkaline Phosphatase 218 (H) 38 - 126 U/L   Total Bilirubin 0.3 0.3 - 1.2 mg/dL   GFR calc non Af Amer >60 >60 mL/min   GFR calc Af Amer >60 >60 mL/min   Anion gap 8 5 - 15  Ethanol (ETOH)  Result Value Ref Range   Alcohol, Ethyl (B)  345 (HH) <5 mg/dL  CBC  Result Value Ref Range   WBC 5.4 4.0 - 10.5 K/uL   RBC 3.48 (L) 4.22 - 5.81 MIL/uL   Hemoglobin 12.0 (L) 13.0 - 17.0 g/dL   HCT 33.4 (L) 39.0 - 52.0 %   MCV 96.0 78.0 - 100.0 fL   MCH 34.5 (H) 26.0 - 34.0 pg   MCHC 35.9 30.0 - 36.0 g/dL   RDW 13.0 11.5 - 15.5 %   Platelets 114 (L) 150 - 400 K/uL  Urine rapid drug screen (hosp performed) (Not at [facility])  Result Value Ref Range   Opiates NONE DETECTED NONE DETECTED   Cocaine NONE DETECTED NONE DETECTED   Benzodiazepines NONE DETECTED NONE DETECTED   Amphetamines NONE DETECTED NONE DETECTED   Tetrahydrocannabinol NONE DETECTED NONE DETECTED   Barbiturates NONE DETECTED NONE DETECTED  CBG monitoring, ED  Result Value Ref Range   Glucose-Capillary 427 (H) 65 - 99 mg/dL   Dg Chest 2 View  11/06/2014   CLINICAL DATA:  Chest pain for 1 day.  EXAM: CHEST  2 VIEW  COMPARISON:  10/22/2012  FINDINGS: Mild hyperinflation. Numerous leads and wires project over the chest. Midline trachea. Normal heart size and mediastinal contours. No pleural effusion or pneumothorax. Biapical pleural thickening. Azygos fissure. Clear lungs.  IMPRESSION: COPD/hyperinflation, without acute cardiopulmonary disease.   Electronically Signed   By: Abigail Miyamoto M.D.   On: 11/06/2014 19:05    Wants detox but is not suicidal or homicidal.     I personally performed the services described in this documentation, which was scribed in my presence. The recorded information has been reviewed and is accurate.       Veatrice Kells, MD 12/01/14 [id number]

## 2014-12-01 NOTE — ED Notes (Signed)
Bed: [facility]PT4 Expected date:  Expected time:  Means of arrival:  Comments: EMS ETOH detox

## 2014-12-02 LAB — CBG MONITORING, ED: Glucose-Capillary: 218 mg/dL — ABNORMAL HIGH (ref 65–99)

## 2014-12-02 MED ORDER — METFORMIN HCL 1000 MG PO TABS: 1000.0000 mg | ORAL_TABLET | Freq: Two times a day (BID) | ORAL | Status: DC

## 2014-12-02 MED ORDER — INSULIN NPH (HUMAN) (ISOPHANE) 100 UNIT/ML ~~LOC~~ SUSP: 8.0000 [IU] | Freq: Two times a day (BID) | SUBCUTANEOUS | Status: DC

## 2014-12-02 MED ORDER — GABAPENTIN 600 MG PO TABS: 600.0000 mg | ORAL_TABLET | Freq: Three times a day (TID) | ORAL | Status: DC

## 2014-12-02 NOTE — BH Assessment (Signed)
[name] Assessment Progress Note  At 17:00 [name] calls from [facility].  Pt has been accepted to their facility with three caveats: 1) Pt must present with all home medications on hand; 2) Transportation arrangements must be made for him; 3) Pt's nurse will need to call [phone] to notify [facility] when pt is sent.  [facility], [place], has been informed of these details.  [name], [place] Triage Specialist [phone]

## 2014-12-02 NOTE — BH Assessment (Signed)
[facility] Assessment Progress Note  Per Hampton Abbot, MD, this pt would benefit from admission to a detox/rehabilition facility.  She has asked this Probation officer to pursue placement at [facility] or [facility].  As of this writing, [facility] are both considering pt for admission, but neither has made a final decision.  [facility] [place], has been apprised of pt's current status.  Jalene Mullet, [place] Triage Specialist [phone]
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
## 2014-12-02 NOTE — ED Notes (Signed)
Pt to be transferred to [facility] via [facility]. Prescription medications filled for pt by outpatient pharmacy. Discharge paperwork reviewed with pt.

## 2014-12-07 ENCOUNTER — Emergency Department (HOSPITAL_COMMUNITY)
Admission: EM | Admit: 2014-12-07 | Discharge: 2014-12-08 | Disposition: A | Discharge status: Home / Self Care | Payer: Self-pay | Attending: Emergency Medicine | Admitting: Emergency Medicine

## 2014-12-07 ENCOUNTER — Encounter (HOSPITAL_COMMUNITY): Payer: Self-pay | Admitting: Emergency Medicine

## 2014-12-07 DIAGNOSIS — Z720 Tobacco use: Secondary | ICD-10-CM | POA: Insufficient documentation

## 2014-12-07 DIAGNOSIS — Z8619 Personal history of other infectious and parasitic diseases: Secondary | ICD-10-CM | POA: Insufficient documentation

## 2014-12-07 DIAGNOSIS — Z79899 Other long term (current) drug therapy: Secondary | ICD-10-CM | POA: Insufficient documentation

## 2014-12-07 DIAGNOSIS — Z794 Long term (current) use of insulin: Secondary | ICD-10-CM | POA: Insufficient documentation

## 2014-12-07 DIAGNOSIS — F419 Anxiety disorder, unspecified: Secondary | ICD-10-CM | POA: Insufficient documentation

## 2014-12-07 DIAGNOSIS — E119 Type 2 diabetes mellitus without complications: Secondary | ICD-10-CM | POA: Insufficient documentation

## 2014-12-07 DIAGNOSIS — Z862 Personal history of diseases of the blood and blood-forming organs and certain disorders involving the immune mechanism: Secondary | ICD-10-CM | POA: Insufficient documentation

## 2014-12-07 DIAGNOSIS — R5383 Other fatigue: Secondary | ICD-10-CM | POA: Insufficient documentation

## 2014-12-07 LAB — I-STAT CHEM 8, ED
BUN: 20 mg/dL (ref 6–20)
CREATININE: 0.8 mg/dL (ref 0.61–1.24)
Calcium, Ion: 1.2 mmol/L (ref 1.12–1.23)
Chloride: 96 mmol/L — ABNORMAL LOW (ref 101–111)
Glucose, Bld: 277 mg/dL — ABNORMAL HIGH (ref 65–99)
HCT: 35 % — ABNORMAL LOW (ref 39.0–52.0)
Hemoglobin: 11.9 g/dL — ABNORMAL LOW (ref 13.0–17.0)
Potassium: 4.5 mmol/L (ref 3.5–5.1)
SODIUM: 133 mmol/L — AB (ref 135–145)
TCO2: 25 mmol/L (ref 0–100)

## 2014-12-07 NOTE — ED Notes (Signed)
Pt states that he was recently discharged from [facility] and he feels that he is "withdrawing from Ativan" which he was treated with when he was there.  He reports that they did attempt to wean him from the medication while he was being treated but his current symptoms seem like what he would expect withdrawal symptoms to be like. He states that he does have SI but no plan and he has no intention of harming himself.  He is looking forward to 28-day treatment at [facility] starting Monday 8/1 at 0800.  He made it very clear that he wants to participate in this alcohol treatment program.  No self-harming behaviors noted.  PA Rob at bedside.

## 2014-12-07 NOTE — ED Provider Notes (Signed)
CSN: [id number]     Arrival date & time 12/07/14  2123 History   First MD Initiated Contact with Patient 12/07/14 2146     Chief Complaint  Patient presents with  . Fatigue  . Depression     (Consider location/radiation/quality/duration/timing/severity/associated sxs/prior Treatment) HPI Comments: Patient presents to the emergency department with chief complaint of feeling fatigued. Patient states that he was out running errands yesterday and today. States that he is feeling tired. States that he has recently gone through alcohol detox. He is going to be evaluated for further inpatient care on Monday morning. He states that he "just wanted to be checked out today." He denies any fever, chills, chest pain, shortness breath, abdominal pain, nausea, or vomiting.  The history is provided by the patient. No language interpreter was used.    Past Medical History  Diagnosis Date  . Hepatitis C   . Diabetes mellitus   . Chronic hyponatremia     pt denies having   . Alcoholic   . Alcoholic cirrhosis of liver     Pt calls this a false diagnosis. Pt denies  . Thrombocytopenia   . Hyperglycemia   . Depression   . Anxiety    Past Surgical History  Procedure Laterality Date  . Plate in lower right leg  2008  . Fracture surgery  right lower leg plate placed years ago   Family History  Problem Relation Age of Onset  . ALS Father    History  Substance Use Topics  . Smoking status: Current Every Day Smoker -- 1.00 packs/day for 40 years    Types: Cigarettes  . Smokeless tobacco: Never Used  . Alcohol Use: Yes     Comment: 18/daily    Review of Systems  Constitutional: Positive for fatigue. Negative for fever and chills.  Respiratory: Negative for shortness of breath.   Cardiovascular: Negative for chest pain.  Gastrointestinal: Negative for nausea, vomiting, diarrhea and constipation.  Genitourinary: Negative for dysuria.  All other systems reviewed and are  negative.     Allergies  Benadryl; Tegretol; Librium; and Sulfa antibiotics  Home Medications   Prior to Admission medications   Medication Sig Start Date End Date Taking? Authorizing Provider  gabapentin (NEURONTIN) 600 MG tablet Take 1 tablet (600 mg total) by mouth 3 (three) times daily. 12/02/14  Yes Daleen Bo, MD  insulin NPH Human (HUMULIN N,NOVOLIN N) 100 UNIT/ML injection Inject 0.08 mLs (8 Units total) into the skin 2 (two) times daily. 12/02/14  Yes Hampton Abbot, MD  metFORMIN (GLUCOPHAGE) 1000 MG tablet Take 1 tablet (1,000 mg total) by mouth 2 (two) times daily with a meal. 12/02/14  Yes Hampton Abbot, MD  FLUoxetine (PROZAC) 10 MG capsule Take 1 capsule (10 mg total) by mouth daily. Patient not taking: Reported on 12/01/2014 11/23/14   Kerrie Buffalo, NP   BP 119/73 mmHg  Pulse 88  Temp(Src) 98.1 F (36.7 C) (Oral)  Resp 18  SpO2 98% Physical Exam  Constitutional: He is oriented to person, place, and time. He appears well-developed and well-nourished.  HENT:  Head: Normocephalic and atraumatic.  Eyes: Conjunctivae and EOM are normal. Pupils are equal, round, and reactive to light. Right eye exhibits no discharge. Left eye exhibits no discharge. No scleral icterus.  Neck: Normal range of motion. Neck supple. No JVD present.  Cardiovascular: Normal rate, regular rhythm and normal heart sounds.  Exam reveals no gallop and no friction rub.   No murmur heard. Pulmonary/Chest: Effort normal and  breath sounds normal. No respiratory distress. He has no wheezes. He has no rales. He exhibits no tenderness.  Abdominal: Soft. He exhibits no distension and no mass. There is no tenderness. There is no rebound and no guarding.  Musculoskeletal: Normal range of motion. He exhibits no edema or tenderness.  Neurological: He is alert and oriented to person, place, and time.  Skin: Skin is warm and dry.  Psychiatric: He has a normal mood and affect. His behavior is normal. Judgment and  thought content normal.  Nursing note and vitals reviewed.   ED Course  Procedures (including critical care time) Results for orders placed or performed during the hospital encounter of 12/07/14  I-Stat Chem 8, ED  Result Value Ref Range   Sodium 133 (L) 135 - 145 mmol/L   Potassium 4.5 3.5 - 5.1 mmol/L   Chloride 96 (L) 101 - 111 mmol/L   BUN 20 6 - 20 mg/dL   Creatinine, Ser 0.80 0.61 - 1.24 mg/dL   Glucose, Bld 277 (H) 65 - 99 mg/dL   Calcium, Ion 1.20 1.12 - 1.23 mmol/L   TCO2 25 0 - 100 mmol/L   Hemoglobin 11.9 (L) 13.0 - 17.0 g/dL   HCT 35.0 (L) 39.0 - 52.0 %   No results found.   Imaging Review No results found.   EKG Interpretation None      MDM   Final diagnoses:  Other fatigue    Patient with complaint of fatigue and depression. Patient states that he may have overexerted himself yesterday and today. States that he has been out in the sun a lot.  Will check chemistry panel, give oral fluids, and will reassess.  11:49 PM Patient reassessed. He is feeling better. States that he has calmed down significantly. Blood sugar is elevated to 277, but this is lower than he has been in the past.  Anion gap is 12.  Patient is symptom free.  He is stable and ready for discharge.    Montine Circle, PA-C 12/07/14 2352  Blanchie Dessert, MD 12/10/14 [phone]

## 2014-12-07 NOTE — Discharge Instructions (Signed)

## 2014-12-07 NOTE — ED Notes (Addendum)
Pt voluntary c/o of being depressed and wanted give up on life. He reports that he was recently released from [facility] and is scheduled for a 28 day program with [facility] starting Monday at 0800. He reports no alcohol since he last was seen here on 7/24. Denies SI or HI. Pt reports he is not feeling well and has been outside of all day.

## 2014-12-31 ENCOUNTER — Emergency Department (HOSPITAL_COMMUNITY)
Admission: EM | Admit: 2014-12-31 | Discharge: 2014-12-31 | Disposition: A | Discharge status: Home / Self Care | Payer: Self-pay | Attending: Emergency Medicine | Admitting: Emergency Medicine

## 2014-12-31 ENCOUNTER — Encounter (HOSPITAL_COMMUNITY): Payer: Self-pay | Admitting: Emergency Medicine

## 2014-12-31 DIAGNOSIS — Z79899 Other long term (current) drug therapy: Secondary | ICD-10-CM | POA: Insufficient documentation

## 2014-12-31 DIAGNOSIS — K0889 Other specified disorders of teeth and supporting structures: Secondary | ICD-10-CM

## 2014-12-31 DIAGNOSIS — Z794 Long term (current) use of insulin: Secondary | ICD-10-CM | POA: Insufficient documentation

## 2014-12-31 DIAGNOSIS — K029 Dental caries, unspecified: Secondary | ICD-10-CM | POA: Insufficient documentation

## 2014-12-31 DIAGNOSIS — F419 Anxiety disorder, unspecified: Secondary | ICD-10-CM | POA: Insufficient documentation

## 2014-12-31 DIAGNOSIS — K088 Other specified disorders of teeth and supporting structures: Secondary | ICD-10-CM | POA: Insufficient documentation

## 2014-12-31 DIAGNOSIS — F329 Major depressive disorder, single episode, unspecified: Secondary | ICD-10-CM | POA: Insufficient documentation

## 2014-12-31 DIAGNOSIS — E119 Type 2 diabetes mellitus without complications: Secondary | ICD-10-CM | POA: Insufficient documentation

## 2014-12-31 DIAGNOSIS — Z720 Tobacco use: Secondary | ICD-10-CM | POA: Insufficient documentation

## 2014-12-31 DIAGNOSIS — Z862 Personal history of diseases of the blood and blood-forming organs and certain disorders involving the immune mechanism: Secondary | ICD-10-CM | POA: Insufficient documentation

## 2014-12-31 MED ORDER — PENICILLIN V POTASSIUM 500 MG PO TABS: 500.0000 mg | ORAL_TABLET | Freq: Three times a day (TID) | ORAL | Status: DC

## 2014-12-31 MED ORDER — IBUPROFEN 800 MG PO TABS: 800.0000 mg | ORAL_TABLET | Freq: Three times a day (TID) | ORAL | Status: DC

## 2014-12-31 NOTE — ED Notes (Signed)
Pt c/o needing a tooth pulled for "a long time." Has abscess near philtrum in the upper part of the mouth near infected front teeth. Has dentist but says he doesn't have the money to have them pulled right now. Denies N/V/D/fevers.

## 2014-12-31 NOTE — ED Provider Notes (Signed)
History  This chart was scribed for non-physician practitioner, Charlann Lange, PA-C,working with Virgel Manifold, MD, by Marlowe Kays, ED Scribe. This patient was seen in room WTR6/WTR6 and the patient's care was started at 9:02 PM.  Chief Complaint  Patient presents with  . Dental Pain  . Mouth Abscess    The history is provided by the patient and medical records. No language interpreter was used.    HPI Comments:  [name] is a 60 y.o. male who presents to the Emergency Department complaining of severe upper, front-left throbbing dental pain that began two days ago. He reports associated abscess to the area. He has not taken anything for pain. He denies modifying factors. He denies fever, chills, drainage or bleeding from the gums, nausea or vomiting.   Past Medical History  Diagnosis Date  . Hepatitis C   . Diabetes mellitus   . Chronic hyponatremia     pt denies having   . Alcoholic   . Alcoholic cirrhosis of liver     Pt calls this a false diagnosis. Pt denies  . Thrombocytopenia   . Hyperglycemia   . Depression   . Anxiety    Past Surgical History  Procedure Laterality Date  . Plate in lower right leg  2008  . Fracture surgery  right lower leg plate placed years ago   Family History  Problem Relation Age of Onset  . ALS Father    Social History  Substance Use Topics  . Smoking status: Current Every Day Smoker -- 1.00 packs/day for 40 years    Types: Cigarettes  . Smokeless tobacco: Never Used  . Alcohol Use: Yes     Comment: 19/daily    Review of Systems  HENT: Positive for dental problem.   All other systems reviewed and are negative.   Allergies  Benadryl; Tegretol; Librium; and Sulfa antibiotics  Home Medications   Prior to Admission medications   Medication Sig Start Date End Date Taking? Authorizing Provider  FLUoxetine (PROZAC) 10 MG capsule Take 1 capsule (10 mg total) by mouth daily. Patient not taking: Reported on 12/01/2014 11/23/14    Kerrie Buffalo, NP  gabapentin (NEURONTIN) 600 MG tablet Take 1 tablet (600 mg total) by mouth 3 (three) times daily. 12/02/14   Daleen Bo, MD  insulin NPH Human (HUMULIN N,NOVOLIN N) 100 UNIT/ML injection Inject 0.08 mLs (8 Units total) into the skin 2 (two) times daily. 12/02/14   Hampton Abbot, MD  metFORMIN (GLUCOPHAGE) 1000 MG tablet Take 1 tablet (1,000 mg total) by mouth 2 (two) times daily with a meal. 12/02/14   Hampton Abbot, MD   Triage Vitals: BP 166/87 mmHg  Pulse 102  Temp(Src) 98.2 F (36.8 C) (Oral)  Resp 16  SpO2 97% Physical Exam  Constitutional: He is oriented to person, place, and time. He appears well-developed and well-nourished.  HENT:  Head: Normocephalic and atraumatic.  Severe dental decay of incisors with gingival swelling. No pointing abscess.   Eyes: EOM are normal.  Neck: Normal range of motion.  Cardiovascular: Normal rate.   Pulmonary/Chest: Effort normal.  Musculoskeletal: Normal range of motion.  Neurological: He is alert and oriented to person, place, and time.  Skin: Skin is warm and dry.  Psychiatric: He has a normal mood and affect. His behavior is normal.  Nursing note and vitals reviewed.   ED Course  Procedures (including critical care time) DIAGNOSTIC STUDIES: Oxygen Saturation is 97% on RA, normal by my interpretation.   COORDINATION OF  CARE: 9:04 PM- Will prescribe PCN and Ibuprofen and give dental referral. Pt verbalizes understanding and agrees to plan.  Medications - No data to display  Labs Review Labs Reviewed - No data to display  Imaging Review No results found. I have personally reviewed and evaluated these images and lab results as part of my medical decision-making.   EKG Interpretation None      MDM   Final diagnoses:  None    1. Dental decay 2. Dental pain  Will cover with an antibiotic. Recommended ibuprofen for pain and dental follow up.   I personally performed the services described in this  documentation, which was scribed in my presence. The recorded information has been reviewed and is accurate.    Charlann Lange, PA-C 01/07/15 [phone]  Virgel Manifold, MD 01/09/15 [phone]

## 2014-12-31 NOTE — Discharge Instructions (Signed)
Dental Caries Dental caries (also called tooth decay) is the most common oral disease. It can occur at any age but is more common in children and young adults.  HOW DENTAL CARIES DEVELOPS  The process of decay begins when bacteria and foods (particularly sugars and starches) combine in your mouth to produce plaque. Plaque is a substance that sticks to the hard, outer surface of a tooth (enamel). The bacteria in plaque produce acids that attack enamel. These acids may also attack the root surface of a tooth (cementum) if it is exposed. Repeated attacks dissolve these surfaces and create holes in the tooth (cavities). If left untreated, the acids destroy the other layers of the tooth.  RISK FACTORS  Frequent sipping of sugary beverages.   Frequent snacking on sugary and starchy foods, especially those that easily get stuck in the teeth.   Poor oral hygiene.   Dry mouth.   Substance abuse such as methamphetamine abuse.   Broken or poor-fitting dental restorations.   Eating disorders.   Gastroesophageal reflux disease (GERD).   Certain radiation treatments to the head and neck. SYMPTOMS In the early stages of dental caries, symptoms are seldom present. Sometimes white, chalky areas may be seen on the enamel or other tooth layers. In later stages, symptoms may include:  Pits and holes on the enamel.  Toothache after sweet, hot, or cold foods or drinks are consumed.  Pain around the tooth.  Swelling around the tooth. DIAGNOSIS  Most of the time, dental caries is detected during a regular dental checkup. A diagnosis is made after a thorough medical and dental history is taken and the surfaces of your teeth are checked for signs of dental caries. Sometimes special instruments, such as lasers, are used to check for dental caries. Dental X-ray exams may be taken so that areas not visible to the eye (such as between the contact areas of the teeth) can be checked for cavities.    TREATMENT  If dental caries is in its early stages, it may be reversed with a fluoride treatment or an application of a remineralizing agent at the dental office. Thorough brushing and flossing at home is needed to aid these treatments. If it is in its later stages, treatment depends on the location and extent of tooth destruction:   If a small area of the tooth has been destroyed, the destroyed area will be removed and cavities will be filled with a material such as gold, silver amalgam, or composite resin.   If a large area of the tooth has been destroyed, the destroyed area will be removed and a cap (crown) will be fitted over the remaining tooth structure.   If the center part of the tooth (pulp) is affected, a procedure called a root canal will be needed before a filling or crown can be placed.   If most of the tooth has been destroyed, the tooth may need to be pulled (extracted). HOME CARE INSTRUCTIONS You can prevent, stop, or reverse dental caries at home by practicing good oral hygiene. Good oral hygiene includes:  Thoroughly cleaning your teeth at least twice a day with a toothbrush and dental floss.   Using a fluoride toothpaste. A fluoride mouth rinse may also be used if recommended by your dentist or health care provider.   Restricting the amount of sugary and starchy foods and sugary liquids you consume.   Avoiding frequent snacking on these foods and sipping of these liquids.   Keeping regular visits with  a dentist for checkups and cleanings. PREVENTION   Practice good oral hygiene.  Consider a dental sealant. A dental sealant is a coating material that is applied by your dentist to the pits and grooves of teeth. The sealant prevents food from being trapped in them. It may protect the teeth for several years.  Ask about fluoride supplements if you live in a community without fluorinated water or with water that has a low fluoride content. Use fluoride supplements  as directed by your dentist or health care provider.  Allow fluoride varnish applications to teeth if directed by your dentist or health care provider. Document Released: 01/16/2002 Document Revised: 09/10/2013 Document Reviewed: 04/28/2012 [facility] Patient Information 2015 [name], [place]. This information is not intended to replace advice given to you by your health care provider. Make sure you discuss any questions you have with your health care provider.  Dental Care and Dentist Visits Dental care supports good overall health. Regular dental visits can also help you avoid dental pain, bleeding, infection, and other more serious health problems in the future. It is important to keep the mouth healthy because diseases in the teeth, gums, and other oral tissues can spread to other areas of the body. Some problems, such as diabetes, heart disease, and pre-term labor have been associated with poor oral health.  See your dentist every 6 months. If you experience emergency problems such as a toothache or broken tooth, go to the dentist right away. If you see your dentist regularly, you may catch problems early. It is easier to be treated for problems in the early stages.  WHAT TO EXPECT AT A DENTIST VISIT  Your dentist will look for many common oral health problems and recommend proper treatment. At your regular dental visit, you can expect:  Gentle cleaning of the teeth and gums. This includes scraping and polishing. This helps to remove the sticky substance around the teeth and gums (plaque). Plaque forms in the mouth shortly after eating. Over time, plaque hardens on the teeth as tartar. If tartar is not removed regularly, it can cause problems. Cleaning also helps remove stains.  Periodic X-rays. These pictures of the teeth and supporting bone will help your dentist assess the health of your teeth.  Periodic fluoride treatments. Fluoride is a natural mineral shown to help strengthen teeth. Fluoride  treatmentinvolves applying a fluoride gel or varnish to the teeth. It is most commonly done in children.  Examination of the mouth, tongue, jaws, teeth, and gums to look for any oral health problems, such as:  Cavities (dental caries). This is decay on the tooth caused by plaque, sugar, and acid in the mouth. It is best to catch a cavity when it is small.  Inflammation of the gums caused by plaque buildup (gingivitis).  Problems with the mouth or malformed or misaligned teeth.  Oral cancer or other diseases of the soft tissues or jaws. KEEP YOUR TEETH AND GUMS HEALTHY For healthy teeth and gums, follow these general guidelines as well as your dentist's specific advice:  Have your teeth professionally cleaned at the dentist every 6 months.  Brush twice daily with a fluoride toothpaste.  Floss your teeth daily.  Ask your dentist if you need fluoride supplements, treatments, or fluoride toothpaste.  Eat a healthy diet. Reduce foods and drinks with added sugar.  Avoid smoking. TREATMENT FOR ORAL HEALTH PROBLEMS If you have oral health problems, treatment varies depending on the conditions present in your teeth and gums.  Your caregiver  will most likely recommend good oral hygiene at each visit.  For cavities, gingivitis, or other oral health disease, your caregiver will perform a procedure to treat the problem. This is typically done at a separate appointment. Sometimes your caregiver will refer you to another dental specialist for specific tooth problems or for surgery. SEEK IMMEDIATE DENTAL CARE IF:  You have pain, bleeding, or soreness in the gum, tooth, jaw, or mouth area.  A permanent tooth becomes loose or separated from the gum socket.  You experience a blow or injury to the mouth or jaw area. Document Released: 01/06/2011 Document Revised: 07/19/2011 Document Reviewed: 01/06/2011 [facility] Patient Information 2015 [place], [place]. This information is not intended to  replace advice given to you by your health care provider. Make sure you discuss any questions you have with your health care provider.  Emergency Department Resource Guide 1) Find a Doctor and Pay Out of Pocket Although you won't have to find out who is covered by your insurance plan, it is a good idea to ask around and get recommendations. You will then need to call the office and see if the doctor you have chosen will accept you as a new patient and what types of options they offer for patients who are self-pay. Some doctors offer discounts or will set up payment plans for their patients who do not have insurance, but you will need to ask so you aren't surprised when you get to your appointment.  2) Contact Your Local Health Department Not all health departments have doctors that can see patients for sick visits, but many do, so it is worth a call to see if yours does. If you don't know where your local health department is, you can check in your phone book. The CDC also has a tool to help you locate your state's health department, and many state websites also have listings of all of their local health departments.  3) Find a [facility] If your illness is not likely to be very severe or complicated, you may want to try a walk in clinic. These are popping up all over the country in pharmacies, drugstores, and shopping centers. They're usually staffed by nurse practitioners or physician assistants that have been trained to treat common illnesses and complaints. They're usually fairly quick and inexpensive. However, if you have serious medical issues or chronic medical problems, these are probably not your best option.  No Primary Care Doctor: - Call Health Connect at  [phone] - they can help you locate a primary care doctor that  accepts your insurance, provides certain services, etc. - Physician Referral Service- [phone]  Chronic Pain Problems: Organization         Address  Phone    Notes  [facility]  [phone] Patients need to be referred by their primary care doctor.   Medication Assistance: Organization         Address  Phone   Notes  [facility] Medication [facility] [place] [place], [place] [address] [phone] --Must be a resident of [facility] -- Must have NO insurance coverage whatsoever (no Medicaid/ Medicare, etc.) -- The pt. MUST have a primary care doctor that directs their care regularly and follows them in the community   MedAssist  [phone]   [employer]  [phone]    Agencies that provide inexpensive medical care: Patent attorney  Notes  [place]  [phone]   [facility]    [phone]   [facility] [place], [place] [address] [phone]   [place] [address], [place] [phone]   Planned Parenthood    [phone]   [facility]    [phone]   [place] and [place] [address], [place] Phone:  [phone], Fax:  [phone] Hours of Operation:  9 am - 6 pm, M-F.  Also accepts Medicaid/Medicare and self-pay.  [facility] for [place] [address], [address], [place] Phone: [phone], Fax: [phone]. Hours of Operation:  8:30 am - 5:30 pm, M-F.  Also accepts Medicaid and self-pay.  [facility] [place] [address] [place] Phone: [phone]   [place], [place], [place] [phone], Ext. [phone] Mondays & Thursdays: 7-9 AM.  First 15 patients are seen on a first come, first serve basis.    [place] Providers:  Organization         Address  Phone   Notes  [facility] [address], [place] [phone] Also accepts self-pay patients.  [facility]  [address], [place]  [phone]   [place], [address], [place] [phone]   [facility] [address], [place] [phone]   Lucianne Lei [address], [place]   [phone] Only accepts [place] Access [place] patients after they have their name applied to their card.   Self-Pay (no insurance) in [facility]:  Organization         Address  Phone   Notes  Sickle Cell Patients, [facility] Junior [phone]   [facility] [place] [phone]   [facility] [place]  [place], [place], [place] [phone]   [facility]/Dr. Osei-Bonsu  [address], [place] or [address], [place] [phone] Phone number for both [place] and [place] locations is the same.  [facility] and [facility] [address] [place] [phone]   [facility] [address] [place] or [address] [phone]   [facility] [address], [place] [phone], phone; [phone], fax Sees patients 1st and 3rd Saturday of every month.  Must not qualify for public or private insurance (i.e. Medicaid, Medicare, [place] Health Choice, Veterans' Benefits)  Household income should be no more than 200% of the poverty level The clinic cannot treat you if you are pregnant or think you are pregnant  Sexually transmitted diseases are not treated at the clinic.    Dental Care: Organization         Address  Phone  Notes  [facility] of [facility] [address] [phone] Accepts children up to age 26 who are enrolled in [place] or [place]; pregnant women with a Medicaid card; and children who have  applied for Medicaid or [place] Health Choice, but were declined, whose parents can pay a reduced fee at time of service.  [facility]  Department of [facility] [place]  [address], [place] [phone] Accepts children up to age 82 who are enrolled in [place] or [place]; pregnant women with a Medicaid card; and children who have applied for Medicaid or [place] Health Choice, but were declined, whose parents can pay a reduced fee at time of service.  [place] Adult Dental Access PROGRAM  [place] [phone] Patients are seen by appointment only. Walk-ins are not accepted. [place] will see patients 63 years of age and older. Monday - Tuesday (8am-5pm) Most Wednesdays (8:30-5pm) $30 per visit, cash only  [facility] Adult Dental Access PROGRAM  [address], [facility] [phone] Patients are seen by appointment only. Walk-ins are not accepted. [place] will see patients 61 years of age and older. One Wednesday Evening (Monthly: Volunteer Based).  $30 per visit, cash only  [place]  [phone] for adults; Children under age 46, call Graduate Pediatric Dentistry at [phone]. Children aged 16-14, please call [phone] to request a pediatric application.  Dental services are provided in all areas of dental care including fillings, crowns and bridges, complete and partial dentures, implants, gum treatment, root canals, and extractions. Preventive care is also provided. Treatment is provided to both adults and children. Patients are selected via a lottery and there is often a waiting list.   [facility] [address] [place]  [phone] [email or web address]   [facility] [address] [place], [place] [phone], Ext. [phone] Second and Fourth Thursday of each month, opens at 6:30 AM; Clinic ends at 9 AM.  Patients are seen on a first-come first-served basis, and a  limited number are seen during each clinic.   [facility]  [address] Hillard Danker [place], [place] [phone]   Eligibility Requirements You must have lived in [place], [place], or [place] counties for at least the last three months.   You cannot be eligible for state or federal sponsored [employer], including [employer], [place], or [employer].   You generally cannot be eligible for healthcare insurance through your employer.    How to apply: Eligibility screenings are held every Tuesday and Wednesday afternoon from 1:00 pm until 4:00 pm. You do not need an appointment for the interview!  [facility] [address], [place], [place]   [place]  [place] Department  [place]  [phone]    Behavioral Health Resources in the Community: Intensive Outpatient Programs Organization         Address  Phone  Notes  [place]. [address], [place], [place] [phone]   [facility] [address], [place], [place]   ADS: Alcohol & Drug Svcs [address], [place], [place]   [place] [address]  [place], [place] or [phone]   Substance Abuse Resources Organization         Address  Phone  Notes  Alcohol and Drug Services  [phone]   [place]  [phone]   [facility] [place]   Chinita Pester  [phone]   Residential & Outpatient Substance Abuse Program  [phone]   Psychological Services Organization         Address  Phone  Notes  [facility]  [place]   [place] [address], [place] or [phone]    Mobile Crisis Teams Organization          Address  Phone  Notes  [facility], Mobile Crisis Care Unit  [phone]   [facility]  [address] [place], [place]   [place] [address] [place] [phone]    Self-Help/Support Groups Organization         Address  Phone             Notes  [place]. of [place] - variety of support groups  [place] Call for more information  Narcotics Anonymous (NA), [facility] [address], [employer] [place]  2 meetings at this location   Special educational needs teacher         Address  Phone  Notes  [facility] [place],    [place]  [phone]   [facility]  [address] [place] 010932, [place], [place]   [place], [place] [phone] Admissions: 8am-3pm M-F  Incentives Substance [place] [address],    [place], [place] [phone]   [facility] [address] [place], [place], [place]   [facility] [address]  [place], [place]   [facility] Programs - Intensive Outpatient [address] Kristeen Mans [address], [place], [place]   [facility] [place].,  [place], [place] [phone] or [phone]   [facility] ([facility]) [address] [place], [place] Accepts Medicaid  [facility] [place] [address]  [place] [phone] Substance Abuse/Addiction Treatment   [facility] Organization         Address  Phone  Notes  [facility]  [phone]   Domenic Schwab, PhD [address] Arlis [place], [place]   [phone] or [phone]   [facility] [place], [place] [phone]   [facility] [address] [place], [place] [phone] Insurance/Medicaid/sponsorship  through [facility] and Families [address], [address] [place]                                    [place], [place] [phone] [place] [address], [place] [phone]    Dr. Adele Schilder  [phone]   [facility] Dept. 1) [address] [place] 2) [place] 3)  [place] [address], [place] [phone]  [phone]   [place] [phone] or [phone] (After Hours)

## 2015-01-04 ENCOUNTER — Emergency Department (HOSPITAL_COMMUNITY)
Admission: EM | Admit: 2015-01-04 | Discharge: 2015-01-04 | Disposition: A | Discharge status: Home / Self Care | Payer: Self-pay | Attending: Emergency Medicine | Admitting: Emergency Medicine

## 2015-01-04 ENCOUNTER — Encounter (HOSPITAL_COMMUNITY): Payer: Self-pay | Admitting: Emergency Medicine

## 2015-01-04 DIAGNOSIS — S4991XA Unspecified injury of right shoulder and upper arm, initial encounter: Secondary | ICD-10-CM | POA: Insufficient documentation

## 2015-01-04 DIAGNOSIS — S8991XA Unspecified injury of right lower leg, initial encounter: Secondary | ICD-10-CM | POA: Insufficient documentation

## 2015-01-04 DIAGNOSIS — Y92410 Unspecified street and highway as the place of occurrence of the external cause: Secondary | ICD-10-CM | POA: Insufficient documentation

## 2015-01-04 DIAGNOSIS — F329 Major depressive disorder, single episode, unspecified: Secondary | ICD-10-CM | POA: Insufficient documentation

## 2015-01-04 DIAGNOSIS — F419 Anxiety disorder, unspecified: Secondary | ICD-10-CM | POA: Insufficient documentation

## 2015-01-04 DIAGNOSIS — S199XXA Unspecified injury of neck, initial encounter: Secondary | ICD-10-CM | POA: Insufficient documentation

## 2015-01-04 DIAGNOSIS — Z792 Long term (current) use of antibiotics: Secondary | ICD-10-CM | POA: Insufficient documentation

## 2015-01-04 DIAGNOSIS — Z8719 Personal history of other diseases of the digestive system: Secondary | ICD-10-CM | POA: Insufficient documentation

## 2015-01-04 DIAGNOSIS — Z791 Long term (current) use of non-steroidal anti-inflammatories (NSAID): Secondary | ICD-10-CM | POA: Insufficient documentation

## 2015-01-04 DIAGNOSIS — Z862 Personal history of diseases of the blood and blood-forming organs and certain disorders involving the immune mechanism: Secondary | ICD-10-CM | POA: Insufficient documentation

## 2015-01-04 DIAGNOSIS — Z8619 Personal history of other infectious and parasitic diseases: Secondary | ICD-10-CM | POA: Insufficient documentation

## 2015-01-04 DIAGNOSIS — Y998 Other external cause status: Secondary | ICD-10-CM | POA: Insufficient documentation

## 2015-01-04 DIAGNOSIS — S299XXA Unspecified injury of thorax, initial encounter: Secondary | ICD-10-CM | POA: Insufficient documentation

## 2015-01-04 DIAGNOSIS — Z720 Tobacco use: Secondary | ICD-10-CM | POA: Insufficient documentation

## 2015-01-04 DIAGNOSIS — E119 Type 2 diabetes mellitus without complications: Secondary | ICD-10-CM | POA: Insufficient documentation

## 2015-01-04 DIAGNOSIS — Z79899 Other long term (current) drug therapy: Secondary | ICD-10-CM | POA: Insufficient documentation

## 2015-01-04 DIAGNOSIS — S3992XA Unspecified injury of lower back, initial encounter: Secondary | ICD-10-CM | POA: Insufficient documentation

## 2015-01-04 DIAGNOSIS — Y9389 Activity, other specified: Secondary | ICD-10-CM | POA: Insufficient documentation

## 2015-01-04 DIAGNOSIS — S8992XA Unspecified injury of left lower leg, initial encounter: Secondary | ICD-10-CM | POA: Insufficient documentation

## 2015-01-04 DIAGNOSIS — Z794 Long term (current) use of insulin: Secondary | ICD-10-CM | POA: Insufficient documentation
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
## 2015-02-03 ENCOUNTER — Emergency Department (HOSPITAL_COMMUNITY)
Admission: EM | Admit: 2015-02-03 | Discharge: 2015-02-03 | Disposition: A | Discharge status: Home / Self Care | Payer: Self-pay | Attending: Emergency Medicine | Admitting: Emergency Medicine

## 2015-02-03 ENCOUNTER — Encounter (HOSPITAL_COMMUNITY): Payer: Self-pay | Admitting: Cardiology

## 2015-02-03 DIAGNOSIS — Z79899 Other long term (current) drug therapy: Secondary | ICD-10-CM | POA: Insufficient documentation

## 2015-02-03 DIAGNOSIS — Z8619 Personal history of other infectious and parasitic diseases: Secondary | ICD-10-CM | POA: Insufficient documentation

## 2015-02-03 DIAGNOSIS — Z760 Encounter for issue of repeat prescription: Secondary | ICD-10-CM | POA: Insufficient documentation

## 2015-02-03 DIAGNOSIS — Z862 Personal history of diseases of the blood and blood-forming organs and certain disorders involving the immune mechanism: Secondary | ICD-10-CM | POA: Insufficient documentation

## 2015-02-03 DIAGNOSIS — Z720 Tobacco use: Secondary | ICD-10-CM | POA: Insufficient documentation

## 2015-02-03 DIAGNOSIS — E119 Type 2 diabetes mellitus without complications: Secondary | ICD-10-CM | POA: Insufficient documentation

## 2015-02-03 DIAGNOSIS — Z794 Long term (current) use of insulin: Secondary | ICD-10-CM | POA: Insufficient documentation

## 2015-02-03 DIAGNOSIS — Z792 Long term (current) use of antibiotics: Secondary | ICD-10-CM | POA: Insufficient documentation

## 2015-02-03 DIAGNOSIS — Z8659 Personal history of other mental and behavioral disorders: Secondary | ICD-10-CM | POA: Insufficient documentation

## 2015-02-03 MED ORDER — GABAPENTIN 600 MG PO TABS: 600.0000 mg | ORAL_TABLET | Freq: Three times a day (TID) | ORAL | Status: DC

## 2015-02-03 NOTE — ED Notes (Signed)
Pt. Requesting refill of Neurotin

## 2015-02-03 NOTE — Discharge Instructions (Signed)
Medication Refill, Emergency Department We have refilled your medication today as a courtesy to you. It is best for your medical care, however, to take care of getting refills done through your primary caregiver's office. They have your records and can do a better job of follow-up than we can in the emergency department. On maintenance medications, we often only prescribe enough medications to get you by until you are able to see your regular caregiver. This is a more expensive way to refill medications. In the future, please plan for refills so that you will not have to use the emergency department for this. Thank you for your help. Your help allows [place] to better take care of the daily emergencies that enter our department. Document Released: 08/13/2003 Document Revised: 07/19/2011 Document Reviewed: 08/03/2013 [facility] Patient Information 2015 [place], [place]. This information is not intended to replace advice given to you by your health care provider. Make sure you discuss any questions you have with your health care provider.

## 2015-02-03 NOTE — ED Notes (Signed)
Pt reports he is out of his gabapentin and needs a refill.

## 2015-02-03 NOTE — ED Provider Notes (Signed)
CSN: [id number]     Arrival date & time 02/03/15  1650 History   By signing my name below, I, Hilda Lias, attest that this documentation has been prepared under the direction and in the presence of Debroah Baller, NP. Electronically Signed: Hilda Lias, ED Scribe. 02/03/2015. 6:24 PM.  Chief Complaint  Patient presents with  . Medication Refill     The history is provided by the patient. No language interpreter was used.   HPI Comments: [name] is a 59 y.o. male who presents to the Emergency Department requesting a medication refill for his Neurontin which treats nerve damage in his feet and legs due to his diabetic neuropathy. Pt states he just took his last pill and has an appointment on 02/15/2015 to get it refilled. He presents today to get a prescription to last him until his appointment. Other than a refill of Neurontin, pt states he has no complaints and says he has been sober for 2 months and going to [employer] meeting since he was discharged from the hospital. He is also working and going to the gym 3 times a week.   Past Medical History  Diagnosis Date  . Hepatitis C   . Diabetes mellitus   . Chronic hyponatremia     pt denies having   . Alcoholic   . Alcoholic cirrhosis of liver     Pt calls this a false diagnosis. Pt denies  . Thrombocytopenia   . Hyperglycemia   . Depression   . Anxiety    Past Surgical History  Procedure Laterality Date  . Plate in lower right leg  2008  . Fracture surgery  right lower leg plate placed years ago   Family History  Problem Relation Age of Onset  . ALS Father    Social History  Substance Use Topics  . Smoking status: Current Every Day Smoker -- 1.00 packs/day for 40 years    Types: Cigarettes  . Smokeless tobacco: Never Used  . Alcohol Use: Yes     Comment: 18/daily    Review of Systems  Negative except as stated in HPI Allergies  Benadryl; Tegretol; Librium; and Sulfa antibiotics  Home Medications   Prior to  Admission medications   Medication Sig Start Date End Date Taking? Authorizing Ivey Nembhard  Fish Oil-Cholecalciferol (FISH OIL + D3 PO) Take 1 capsule by mouth daily.    Historical Jerald Villalona, MD  FLUoxetine (PROZAC) 10 MG capsule Take 1 capsule (10 mg total) by mouth daily. Patient not taking: Reported on 12/01/2014 11/23/14   Kerrie Buffalo, NP  gabapentin (NEURONTIN) 600 MG tablet Take 1 tablet (600 mg total) by mouth 3 (three) times daily. 02/03/15   Hope Bunnie Pion, NP  ibuprofen (ADVIL,MOTRIN) 200 MG tablet Take 800 mg by mouth 2 (two) times daily as needed for moderate pain.    Historical Gamble Enderle, MD  insulin NPH Human (HUMULIN N,NOVOLIN N) 100 UNIT/ML injection Inject 0.08 mLs (8 Units total) into the skin 2 (two) times daily. Patient not taking: Reported on 01/04/2015 12/02/14   Hampton Abbot, MD  metFORMIN (GLUCOPHAGE) 1000 MG tablet Take 1 tablet (1,000 mg total) by mouth 2 (two) times daily with a meal. 12/02/14   Hampton Abbot, MD  Multiple Vitamins-Minerals (MULTIVITAMIN & MINERAL PO) Take 1 tablet by mouth daily.    Historical Lyzbeth Genrich, MD  penicillin v potassium (VEETID) 500 MG tablet Take 1 tablet (500 mg total) by mouth 3 (three) times daily. 12/31/14   Charlann Lange, PA-C  BP 137/71 mmHg  Pulse 81  Temp(Src) 98.4 F (36.9 C) (Oral)  Resp 16  Ht 6' (1.829 m)  Wt 175 lb (79.379 kg)  BMI 23.73 kg/m2  SpO2 98% Physical Exam  Constitutional: He is oriented to person, place, and time. He appears well-developed and well-nourished.  Non-toxic appearance. No distress.  HENT:  Head: Normocephalic and atraumatic.  Eyes: EOM and lids are normal.  Neck: Normal range of motion. Neck supple. No thyroid mass present.  Cardiovascular: Normal rate.  Exam reveals no gallop.   No murmur heard. Pulmonary/Chest: Effort normal. He has no decreased breath sounds. He has no rhonchi.  Abdominal: Normal appearance. There is no CVA tenderness.  Musculoskeletal: Normal range of motion. He exhibits no edema  or tenderness.  Neurological: He is alert and oriented to person, place, and time.  Skin: Skin is warm and dry. No rash noted.  Psychiatric: He has a normal mood and affect. His speech is normal and behavior is normal.  Nursing note and vitals reviewed.   ED Course  Procedures (including critical care time)  DIAGNOSTIC STUDIES: Oxygen Saturation is 98% on room air, normal by my interpretation.    COORDINATION OF CARE: 6:23 PM Discussed treatment plan with pt at bedside and pt agreed to plan.   MDM  59 y.o. male here for medication refill until his follow up appointment. Will give Rx for Neurontin and he will follow up as scheduled.   Final diagnoses:  Medication refill   I personally performed the services described in this documentation, which was scribed in my presence. The recorded information has been reviewed and is accurate.    [address] [place], NP 02/03/15 1833  Davonna Belling, MD 02/03/15 [phone]

## 2015-03-02 ENCOUNTER — Emergency Department (HOSPITAL_COMMUNITY)
Admission: EM | Admit: 2015-03-02 | Discharge: 2015-03-04 | Payer: Self-pay | Attending: Emergency Medicine | Admitting: Emergency Medicine

## 2015-03-02 ENCOUNTER — Encounter (HOSPITAL_COMMUNITY): Payer: Self-pay | Admitting: Emergency Medicine

## 2015-03-02 DIAGNOSIS — F1010 Alcohol abuse, uncomplicated: Secondary | ICD-10-CM

## 2015-03-02 DIAGNOSIS — G629 Polyneuropathy, unspecified: Secondary | ICD-10-CM | POA: Insufficient documentation

## 2015-03-02 DIAGNOSIS — F329 Major depressive disorder, single episode, unspecified: Secondary | ICD-10-CM | POA: Insufficient documentation

## 2015-03-02 DIAGNOSIS — F10120 Alcohol abuse with intoxication, uncomplicated: Secondary | ICD-10-CM | POA: Insufficient documentation

## 2015-03-02 DIAGNOSIS — Z79899 Other long term (current) drug therapy: Secondary | ICD-10-CM | POA: Insufficient documentation

## 2015-03-02 DIAGNOSIS — D849 Immunodeficiency, unspecified: Secondary | ICD-10-CM | POA: Insufficient documentation

## 2015-03-02 DIAGNOSIS — F10230 Alcohol dependence with withdrawal, uncomplicated: Secondary | ICD-10-CM | POA: Diagnosis present

## 2015-03-02 DIAGNOSIS — F10920 Alcohol use, unspecified with intoxication, uncomplicated: Secondary | ICD-10-CM

## 2015-03-02 DIAGNOSIS — R45851 Suicidal ideations: Secondary | ICD-10-CM

## 2015-03-02 DIAGNOSIS — E119 Type 2 diabetes mellitus without complications: Secondary | ICD-10-CM | POA: Insufficient documentation

## 2015-03-02 DIAGNOSIS — Z8619 Personal history of other infectious and parasitic diseases: Secondary | ICD-10-CM | POA: Insufficient documentation

## 2015-03-02 DIAGNOSIS — F1994 Other psychoactive substance use, unspecified with psychoactive substance-induced mood disorder: Secondary | ICD-10-CM | POA: Diagnosis present

## 2015-03-02 HISTORY — DX: Alcohol dependence, uncomplicated: F10.20

## 2015-03-02 HISTORY — DX: Reserved for inherently not codable concepts without codable children: IMO0001

## 2015-03-02 HISTORY — DX: Type 2 diabetes mellitus without complications: E11.9

## 2015-03-02 HISTORY — DX: Polyneuropathy, unspecified: G62.9

## 2015-03-02 LAB — COMPREHENSIVE METABOLIC PANEL
ALBUMIN: 3.8 g/dL (ref 3.5–5.0)
ALT: 122 U/L — ABNORMAL HIGH (ref 17–63)
ANION GAP: 8 (ref 5–15)
AST: 131 U/L — ABNORMAL HIGH (ref 15–41)
Alkaline Phosphatase: 130 U/L — ABNORMAL HIGH (ref 38–126)
BILIRUBIN TOTAL: 0.9 mg/dL (ref 0.3–1.2)
BUN: 10 mg/dL (ref 6–20)
CALCIUM: 8.6 mg/dL — AB (ref 8.9–10.3)
CO2: 30 mmol/L (ref 22–32)
Chloride: 91 mmol/L — ABNORMAL LOW (ref 101–111)
Creatinine, Ser: 0.7 mg/dL (ref 0.61–1.24)
GFR calc Af Amer: >60 mL/min (ref >60)
GFR calc non Af Amer: >60 mL/min (ref >60)
GLUCOSE: 301 mg/dL — AB (ref 65–99)
Potassium: 4.5 mmol/L (ref 3.5–5.1)
SODIUM: 129 mmol/L — AB (ref 135–145)
Total Protein: 8.4 g/dL — ABNORMAL HIGH (ref 6.5–8.1)

## 2015-03-02 LAB — RAPID URINE DRUG SCREEN, HOSP PERFORMED
Amphetamines: NOT DETECTED
BARBITURATES: NOT DETECTED
BENZODIAZEPINES: NOT DETECTED
COCAINE: NOT DETECTED
Opiates: NOT DETECTED
TETRAHYDROCANNABINOL: NOT DETECTED

## 2015-03-02 LAB — CBC
HEMATOCRIT: 40.1 % (ref 39.0–52.0)
HEMOGLOBIN: 14.2 g/dL (ref 13.0–17.0)
MCH: 34.4 pg — ABNORMAL HIGH (ref 26.0–34.0)
MCHC: 35.4 g/dL (ref 30.0–36.0)
MCV: 97.1 fL (ref 78.0–100.0)
Platelets: 80 10*3/uL — ABNORMAL LOW (ref 150–400)
RBC: 4.13 MIL/uL — ABNORMAL LOW (ref 4.22–5.81)
RDW: 13.5 % (ref 11.5–15.5)
WBC: 6.7 10*3/uL (ref 4.0–10.5)

## 2015-03-02 LAB — ETHANOL: Alcohol, Ethyl (B): 285 mg/dL — ABNORMAL HIGH (ref <5)

## 2015-03-02 LAB — ACETAMINOPHEN LEVEL

## 2015-03-02 LAB — SALICYLATE LEVEL: Salicylate Lvl: <4 mg/dL (ref 2.8–30.0)

## 2015-03-02 MED ORDER — IBUPROFEN 200 MG PO TABS: 600.0000 mg | ORAL_TABLET | Freq: Three times a day (TID) | ORAL | Status: DC | PRN

## 2015-03-02 MED ORDER — LORAZEPAM 1 MG PO TABS
0.0000 mg | ORAL_TABLET | Freq: Four times a day (QID) | ORAL | Status: DC
  Administered 2015-03-03 – 2015-03-04 (×3): 1 mg via ORAL
  Filled 2015-03-02 (×3): qty 1

## 2015-03-02 MED ORDER — LORAZEPAM 1 MG PO TABS
1.0000 mg | ORAL_TABLET | Freq: Three times a day (TID) | ORAL | Status: DC | PRN
  Administered 2015-03-03: 1 mg via ORAL
  Filled 2015-03-02 (×2): qty 1

## 2015-03-02 MED ORDER — VITAMIN B-1 100 MG PO TABS
100.0000 mg | ORAL_TABLET | Freq: Every day | ORAL | Status: DC
  Administered 2015-03-03 – 2015-03-04 (×2): 100 mg via ORAL
  Filled 2015-03-02 (×2): qty 1

## 2015-03-02 MED ORDER — ACETAMINOPHEN 325 MG PO TABS: 650.0000 mg | ORAL_TABLET | ORAL | Status: DC | PRN

## 2015-03-02 MED ORDER — NICOTINE 21 MG/24HR TD PT24
21.0000 mg | MEDICATED_PATCH | Freq: Every day | TRANSDERMAL | Status: DC
  Filled 2015-03-02 (×2): qty 1

## 2015-03-02 MED ORDER — LORAZEPAM 2 MG/ML IJ SOLN: 0.0000 mg | Freq: Four times a day (QID) | INTRAMUSCULAR | Status: DC

## 2015-03-02 MED ORDER — METFORMIN HCL 500 MG PO TABS
1000.0000 mg | ORAL_TABLET | Freq: Two times a day (BID) | ORAL | Status: DC
  Administered 2015-03-03 – 2015-03-04 (×3): 1000 mg via ORAL
  Filled 2015-03-02 (×5): qty 2

## 2015-03-02 MED ORDER — LORAZEPAM 1 MG PO TABS
0.0000 mg | ORAL_TABLET | Freq: Two times a day (BID) | ORAL | Status: DC
  Administered 2015-03-02 – 2015-03-04 (×3): 1 mg via ORAL
  Filled 2015-03-02 (×2): qty 1

## 2015-03-02 MED ORDER — LORAZEPAM 2 MG/ML IJ SOLN: 0.0000 mg | Freq: Two times a day (BID) | INTRAMUSCULAR | Status: DC

## 2015-03-02 MED ORDER — THIAMINE HCL 100 MG/ML IJ SOLN: 100.0000 mg | Freq: Every day | INTRAMUSCULAR | Status: DC

## 2015-03-02 MED ORDER — ADULT MULTIVITAMIN W/MINERALS CH
1.0000 | ORAL_TABLET | Freq: Every day | ORAL | Status: DC
  Administered 2015-03-03 – 2015-03-04 (×2): 1 via ORAL
  Filled 2015-03-02 (×2): qty 1

## 2015-03-02 MED ORDER — ALUM & MAG HYDROXIDE-SIMETH 200-200-20 MG/5ML PO SUSP: 30.0000 mL | ORAL | Status: DC | PRN

## 2015-03-02 MED ORDER — ONDANSETRON HCL 4 MG PO TABS: 4.0000 mg | ORAL_TABLET | Freq: Three times a day (TID) | ORAL | Status: DC | PRN

## 2015-03-02 MED ORDER — GABAPENTIN 300 MG PO CAPS
600.0000 mg | ORAL_CAPSULE | Freq: Three times a day (TID) | ORAL | Status: DC
  Administered 2015-03-03 – 2015-03-04 (×6): 600 mg via ORAL
  Filled 2015-03-02 (×7): qty 2

## 2015-03-02 MED ORDER — ZOLPIDEM TARTRATE 5 MG PO TABS: 5.0000 mg | ORAL_TABLET | Freq: Every evening | ORAL | Status: DC | PRN

## 2015-03-02 NOTE — ED Provider Notes (Signed)
CSN: [id number]     Arrival date & time 03/02/15  2157 History   First MD Initiated Contact with Patient 03/02/15 2203     Chief Complaint  Patient presents with  . alcohol detox    . Suicidal     (Consider location/radiation/quality/duration/timing/severity/associated sxs/prior Treatment) HPI Comments: [name] is a 59 y.o. male with a PMHx of alcoholism, DM2, neuropathy, and Hep C, who presents to the ED requesting detox from alcohol. He reports he has gone through inpatient detox in the past, he was sober for the last 3 months, but 3 weeks ago he relapsed. His last drink was a 40 ounce beer and a 16 ounce beer at 9 PM, but he admits to consuming another 40 ounce beer earlier in the day. He reports he has had DTs in the past, but no withdrawal seizures. He states that due to being angry with himself over relapsing, he has become suicidal with a plan of jumping off a bridge. He denies any homicidal ideations or auditory or visual hallucinations. Denies any illicit drug use. Admits to being a smoker, 1 pack per day. He has no other medical complaints.  Addendum: pt chart merger now on the account (after initial history was taken), appears that pt comes frequently to the ER for alcohol intoxication and SI while drunk. Also appears he has a hx of chronic hyponatremia, alcoholic liver cirrhosis, and thrombocytopenia.  Patient is a 59 y.o. male presenting with alcohol problem. The history is provided by the patient. No language interpreter was used.  Alcohol Problem This is a recurrent problem. The current episode started 1 to 4 weeks ago. The problem occurs constantly. The problem has been unchanged. Pertinent negatives include no abdominal pain, arthralgias, chest pain, chills, fever, myalgias, nausea, numbness, urinary symptoms, vomiting or weakness. The symptoms are aggravated by drinking. He has tried nothing for the symptoms. The treatment provided no relief.    Past Medical History   Diagnosis Date  . Alcoholism /alcohol abuse ([place])   . Diabetes mellitus without complication ([place])   . Hepatitis C   . Neuropathy ([place])    History reviewed. No pertinent past surgical history. History reviewed. No pertinent family history. Social History  Substance Use Topics  . Smoking status: Not on file  . Smokeless tobacco: Not on file  . Alcohol Use: Not on file    Review of Systems  Constitutional: Negative for fever and chills.  Respiratory: Negative for shortness of breath.   Cardiovascular: Negative for chest pain.  Gastrointestinal: Negative for nausea, vomiting, abdominal pain, diarrhea and constipation.  Genitourinary: Negative for dysuria and hematuria.  Musculoskeletal: Negative for myalgias and arthralgias.  Skin: Negative for color change.  Allergic/Immunologic: Positive for immunocompromised state (diabetic).  Neurological: Negative for weakness and numbness.  Psychiatric/Behavioral: Positive for suicidal ideas. Negative for confusion.       +alcohol use   10 Systems reviewed and are negative for acute change except as noted in the HPI.    Allergies  Benadryl and Sulfa antibiotics  Home Medications   Prior to Admission medications   Medication Sig Start Date End Date Taking? Authorizing Provider  gabapentin (NEURONTIN) 300 MG capsule Take 600 mg by mouth 3 (three) times daily.   Yes Historical Provider, MD  metFORMIN (GLUCOPHAGE) 1000 MG tablet Take 1,000 mg by mouth 2 (two) times daily with a meal.   Yes Historical Provider, MD  Multiple Vitamin (MULTIVITAMIN WITH MINERALS) TABS tablet Take 1 tablet by mouth daily.  Yes Historical Provider, MD   BP 134/70 mmHg  Pulse 87  Temp(Src) 98.3 F (36.8 C) (Oral)  Resp 18  SpO2 96% Physical Exam  Constitutional: He is oriented to person, place, and time. Vital signs are normal. He appears well-developed and well-nourished.  Non-toxic appearance. No distress.  Afebrile, nontoxic, NAD  HENT:  Head:  Normocephalic and atraumatic.  Mouth/Throat: Oropharynx is clear and moist and mucous membranes are normal.  Eyes: Conjunctivae and EOM are normal. Right eye exhibits no discharge. Left eye exhibits no discharge.  Neck: Normal range of motion. Neck supple.  Cardiovascular: Normal rate, regular rhythm, normal heart sounds and intact distal pulses.  Exam reveals no gallop and no friction rub.   No murmur heard. Pulmonary/Chest: Effort normal and breath sounds normal. No respiratory distress. He has no decreased breath sounds. He has no wheezes. He has no rhonchi. He has no rales.  Abdominal: Soft. Normal appearance and bowel sounds are normal. He exhibits no distension. There is no tenderness. There is no rigidity, no rebound, no guarding, no CVA tenderness, no tenderness at McBurney's point and negative Murphy's sign.  Musculoskeletal: Normal range of motion.  Neurological: He is alert and oriented to person, place, and time. He has normal strength. No sensory deficit.  Skin: Skin is warm, dry and intact. No rash noted.  Psychiatric: He is not actively hallucinating. He exhibits a depressed mood. He expresses suicidal ideation. He expresses no homicidal ideation. He expresses suicidal plans. He expresses no homicidal plans.  Somewhat depressed affect, appears somewhat intoxicated but still able to communicate, denies HI/AVH.  Nursing note and vitals reviewed.   ED Course  Procedures (including critical care time) Labs Review Labs Reviewed  COMPREHENSIVE METABOLIC PANEL - Abnormal; Notable for the following:    Sodium 129 (*)    Chloride 91 (*)    Glucose, Bld 301 (*)    Calcium 8.6 (*)    Total Protein 8.4 (*)    AST 131 (*)    ALT 122 (*)    Alkaline Phosphatase 130 (*)    All other components within normal limits  ETHANOL - Abnormal; Notable for the following:    Alcohol, Ethyl (B) 285 (*)    All other components within normal limits  ACETAMINOPHEN LEVEL - Abnormal; Notable for the  following:    Acetaminophen (Tylenol), Serum <10 (*)    All other components within normal limits  CBC - Abnormal; Notable for the following:    RBC 4.13 (*)    MCH 34.4 (*)    Platelets 80 (*)    All other components within normal limits  SALICYLATE LEVEL  URINE RAPID DRUG SCREEN, HOSP PERFORMED    Imaging Review No results found. I have personally reviewed and evaluated these images and lab results as part of my medical decision-making.   EKG Interpretation None      MDM   Final diagnoses:  Suicidal ideation  Alcohol abuse  Alcohol intoxication, uncomplicated ([place])    59 y.o. male here requesting detox, and with SI with a plan to jump off a bridge. Hx of DTs. Will get med clearance labs, CIWA in place. Will reassess shortly.   11:40 PM Of note, pt chart merger now on the account (after initial history was taken), appears that pt comes frequently to the ER for alcohol intoxication and SI while drunk. Also appears he has a hx of chronic hyponatremia, alcoholic liver cirrhosis, and thrombocytopenia. Sodium level typically 125-132, and LFTs typically elevated  in the 100s with alkphos in the 100-200s range. Lab results returning now, CMP reveals chronic hyponatremia and hypochloremia, gluc 301 without abnormal bicarb therefore doubt DKA. AST/ALT 131/122 respectively which is similar to baseline, alk phos 130 at baseline and all likely due to his alcoholism. ETOH 285. Salicylate and Acetaminophen WNL. CBC WNL, UDS neg. Pt medically cleared, will consult TTS. Please see their notes for further documentation of care.   BP 134/70 mmHg  Pulse 87  Temp(Src) 98.3 F (36.8 C) (Oral)  Resp 18  SpO2 96%  Meds ordered this encounter  Medications  . LORazepam (ATIVAN) tablet 0-4 mg    Sig:     Order Specific Question:  CIWA-AR < 5 =    Answer:  0 mg    Order Specific Question:  CIWA-AR 5 -10 =    Answer:  1 mg    Order Specific Question:  CIWA-AR 11 -15 =    Answer:  2 mg    Order  Specific Question:  CIWA-AR 16 -24 =    Answer:  2 mg    Order Specific Question:  CIWA-AR 16 -24 =    Answer:  Recheck CIWA-AR in 1 hour; if > 15 notify MD    Order Specific Question:  CIWA-AR > 24 =    Answer:  4 mg    Order Specific Question:  CIWA-AR > 24 =    Answer:  Call Rapid Response  . LORazepam (ATIVAN) tablet 0-4 mg    Sig:     Order Specific Question:  CIWA-AR < 5 =    Answer:  0 mg    Order Specific Question:  CIWA-AR 5 -10 =    Answer:  1 mg    Order Specific Question:  CIWA-AR 11 -15 =    Answer:  2 mg    Order Specific Question:  CIWA-AR 16 -24 =    Answer:  2 mg    Order Specific Question:  CIWA-AR 16 -24 =    Answer:  Recheck CIWA-AR in 1 hour; if > 15 notify MD    Order Specific Question:  CIWA-AR > 24 =    Answer:  4 mg    Order Specific Question:  CIWA-AR > 24 =    Answer:  Call Rapid Response  . thiamine (VITAMIN B-1) tablet 100 mg    Sig:   . LORazepam (ATIVAN) tablet 1 mg    Sig:   . acetaminophen (TYLENOL) tablet 650 mg    Sig:   . ibuprofen (ADVIL,MOTRIN) tablet 600 mg    Sig:   . zolpidem (AMBIEN) tablet 5 mg    Sig:   . nicotine (NICODERM CQ - dosed in mg/24 hours) patch 21 mg    Sig:   . ondansetron (ZOFRAN) tablet 4 mg    Sig:   . alum & mag hydroxide-simeth (MAALOX/MYLANTA) 200-200-20 MG/5ML suspension 30 mL    Sig:   . gabapentin (NEURONTIN) capsule 600 mg    Sig:   . metFORMIN (GLUCOPHAGE) tablet 1,000 mg    Sig:   . multivitamin with minerals tablet 1 tablet    Sig:      Shatoya Roets Camprubi-Soms, PA-C 03/02/15 2348  Ezequiel Essex, MD 03/03/15 6440

## 2015-03-02 NOTE — ED Notes (Signed)
Patient arrives with EMS, requesting alcohol detox. Last drink at 2100, drank a 40oz and a 16oz beer at that time. Patient states he was clean for 3 months and relapsed 3 weeks ago. Patient states he had the drinks at 2100 so he wouldn't go into DTs.

## 2015-03-02 NOTE — ED Notes (Signed)
Bed: [facility]4 Expected date:  Expected time:  Means of arrival:  Comments: EMS 59 yo M Detox

## 2015-03-02 NOTE — ED Notes (Signed)
Patient states he is here for alcohol. Patient is requesting detox, relapsed 3 weeks ago, was sober 3 months prior to that. Patient states he has gone through inpatient detox before, longest stretch of sobriety @ 20 years ago, was sober for 6 years. Reports since then being sober 8-9 months before having relapses.

## 2015-03-03 DIAGNOSIS — F1010 Alcohol abuse, uncomplicated: Secondary | ICD-10-CM | POA: Insufficient documentation

## 2015-03-03 DIAGNOSIS — R45851 Suicidal ideations: Secondary | ICD-10-CM | POA: Insufficient documentation

## 2015-03-03 DIAGNOSIS — F1994 Other psychoactive substance use, unspecified with psychoactive substance-induced mood disorder: Secondary | ICD-10-CM

## 2015-03-03 DIAGNOSIS — F10230 Alcohol dependence with withdrawal, uncomplicated: Secondary | ICD-10-CM | POA: Diagnosis present

## 2015-03-03 LAB — CBG MONITORING, ED: GLUCOSE-CAPILLARY: 245 mg/dL — AB (ref 65–99)

## 2015-03-03 MED ORDER — THIAMINE HCL 100 MG/ML IJ SOLN: Freq: Once | INTRAVENOUS | Status: DC

## 2015-03-03 MED ORDER — FLUOXETINE HCL 20 MG PO CAPS
20.0000 mg | ORAL_CAPSULE | Freq: Every day | ORAL | Status: DC
  Filled 2015-03-03 (×3): qty 1

## 2015-03-03 MED ORDER — THIAMINE HCL 100 MG/ML IJ SOLN
Freq: Once | INTRAVENOUS | Status: AC
  Administered 2015-03-03: 12:00:00 via INTRAVENOUS
  Filled 2015-03-03: qty 1000

## 2015-03-03 NOTE — ED Notes (Signed)
Report called to [facility] in Psych ED.  Patient will return to room 38.

## 2015-03-03 NOTE — ED Notes (Signed)
Bed: [facility] Expected date:  Expected time:  Means of arrival:  Comments: TCU bed 30

## 2015-03-03 NOTE — ED Notes (Signed)
Consulted ED PA concerning blood sugar, AM dose Metformin will be adequate.

## 2015-03-03 NOTE — ED Notes (Signed)
Pt consulting with TTS consultant

## 2015-03-03 NOTE — ED Notes (Signed)
Pt. Noted sleeping in room. No complaints or concerns voiced. No distress or abnormal behavior noted. Will continue to monitor with security cameras. Q 15 minute rounds continue.

## 2015-03-03 NOTE — BH Assessment (Addendum)
Tele Assessment Note   [name] is an 59 y.o. male  presenting to [facility] requesting alcohol detox and reporting suicidal ideations with a  plan to jump from a bridge. Pt stated "I relapse 3 weeks ago and I just about killed myself". "I know it takes longer every time to recover". Pt denies HI and AVH at this time. Pt did not report any previous suicide attempts but shared that he has had substance abuse treatment multiple times in the past. PT reported that he is dealing with several stressors and reported that he was recently kicked out of the [facility] and has been staying at a motel. Pt reported that he has been drinking 4-5 40oz daily for the past 3 weeks. PT did not report any physical, sexual or emotional abuse.  Inpatient treatment is recommended.  Diagnosis: Alcohol intoxication with moderate or severe use disorder   Past Medical History:  Past Medical History  Diagnosis Date  . Alcoholism /alcohol abuse ([name])   . Diabetes mellitus without complication ([place])   . Hepatitis C   . Neuropathy ([place])     History reviewed. No pertinent past surgical history.  Family History: History reviewed. No pertinent family history.  Social History:  reports that he has been smoking Cigarettes.  He has been smoking about 1.00 pack per day. He does not have any smokeless tobacco history on file. He reports that he drinks alcohol. He reports that he does not use illicit drugs.  Additional Social History:  Alcohol / Drug Use History of alcohol / drug use?: Yes Substance #1 Name of Substance 1: Alcohol  1 - Age of First Use: 14 1 - Amount (size/oz): "4-5 40oz"  1 - Frequency: daily  1 - Duration: ongoing  1 - Last Use / Amount: 03-02-15 BAL-285  CIWA: CIWA-Ar BP: 134/70 mmHg Pulse Rate: 87 COWS:    PATIENT STRENGTHS: (choose at least two) Average or above average intelligence Motivation for treatment/growth  Allergies:  Allergies  Allergen Reactions  . Benadryl [Diphenhydramine]  Other (See Comments)    "makes my skin crawl"  . Sulfa Antibiotics Other (See Comments)    Childhood allergy    Home Medications:  (Not in a hospital admission)  OB/GYN Status:  No LMP for male patient.  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 : No Suicidal Intent: Yes-Currently Present Has patient had any suicidal intent within the past 6 months prior to admission? : No Is patient at risk for suicide?: Yes Suicidal Plan?: Yes-Currently Present Has patient had any suicidal plan within the past 6 months prior to admission? : No Specify Current Suicidal Plan: Jump off of a bridge  Access to Means: Yes Specify  Access to Suicidal Means: Access to bridge  What has been your use of drugs/alcohol within the last 12 months?: Alcohol use Previous Attempts/Gestures: No How many times?: 0 Other Self Harm Risks: No other self harm risk identified.  Triggers for Past Attempts: None known Intentional Self Injurious Behavior: None Family Suicide History: No Recent stressful life event(s): Other  (Comment) ("I was kicked out of the [employer]" ) Persecutory voices/beliefs?: No Depression: No Depression Symptoms: Guilt, Feeling angry/irritable, Feeling worthless/self pity Substance abuse history and/or treatment for substance abuse?: Yes  Risk to Others within the past 6 months Homicidal Ideation: No Does patient have any lifetime risk of violence toward others beyond the six months prior to admission? : No Thoughts of Harm to Others: No Current Homicidal Intent: No Current Homicidal Plan: No Access to Homicidal Means: No Identified Victim: N/A History of harm to others?: No Assessment of Violence: None Noted Violent Behavior Description: No violent behaviors observed.  Does patient have access to weapons?: No Criminal Charges Pending?: No Does patient have a court date: No Is patient on probation?: No  Psychosis Hallucinations: With command Delusions: None noted  Mental Status Report Appearance/Hygiene: In scrubs Eye Contact: Good Motor Activity: Freedom of movement Speech: Logical/coherent Level of Consciousness: Quiet/awake Mood: Euthymic, Pleasant Affect: Appropriate to circumstance Anxiety Level: Minimal Thought Processes: Coherent, Relevant Judgement: Impaired (BAL-285) Orientation: Appropriate for developmental age Obsessive Compulsive Thoughts/Behaviors: None  Cognitive Functioning Concentration: Fair Memory: Recent Intact, Remote Intact IQ: Average Insight: Fair Impulse Control: Fair Appetite: Fair Weight Loss: 0 Weight Gain: 0 Sleep: Decreased Total Hours of Sleep: 6 Vegetative Symptoms: Not bathing, Decreased grooming  ADLScreening [facility] Assessment Services) Patient's cognitive ability adequate to safely complete daily activities?: Yes Patient able to express need for assistance with ADLs?: Yes Independently performs ADLs?: Yes (appropriate for developmental age)  Prior Inpatient Therapy Prior Inpatient Therapy: Yes Prior Therapy Dates:  multiple previous years, 2016 Prior Therapy Facilty/Provider(s): [facility], [facility]  Reason for Treatment: Substance abuse   Prior Outpatient Therapy Prior Outpatient Therapy: No Does patient have an ACCT team?: No Does patient have Intensive In-House Services?  : No Does patient have [facility] services? : No Does patient have P4CC services?: No  ADL Screening (condition at time of admission) Patient's cognitive ability adequate to safely complete daily activities?: Yes Is the patient deaf or have difficulty hearing?: No Does the patient have difficulty seeing, even when wearing glasses/contacts?: No Does the patient have difficulty concentrating, remembering, or making decisions?: No Patient able to express need for assistance with ADLs?: Yes Does the patient have difficulty dressing or bathing?: No Independently performs ADLs?: Yes (appropriate for developmental age)       Abuse/Neglect Assessment (Assessment to be complete while patient is alone) Physical Abuse: Denies Verbal Abuse: Denies Sexual Abuse: Denies Exploitation of patient/patient's resources: Denies Self-Neglect: Denies     Regulatory affairs officer (For Healthcare) Does patient have an advance directive?: No Would patient like information on creating an advanced directive?: No - patient declined information    Additional Information 1:1 In Past 12 Months?: No CIRT Risk: No Elopement Risk: No Does patient have medical clearance?: Yes     Disposition:  Disposition Initial Assessment Completed for this Encounter: Yes Disposition of Patient: Inpatient treatment program Type of inpatient treatment program: Adult  Male Iafrate S 03/03/2015 12:25 AM

## 2015-03-03 NOTE — ED Notes (Signed)
Patient refused Prozac.

## 2015-03-03 NOTE — ED Notes (Signed)
Pt. To SAPPU from ED ambulatory without difficulty, to room 38 . Report from [facility]. Pt. Is alert and oriented, warm and dry in no distress. Pt. Denies SI, HI, and AVH. Pt. Calm and cooperative. Pt. Made aware of security cameras and Q15 minute rounds. Pt. Encouraged to let Nursing staff know of any concerns or needs.

## 2015-03-03 NOTE — ED Notes (Signed)
Patient is resting comfortably.

## 2015-03-03 NOTE — [facility] Counselor (Signed)
03/03/15 Referral sent to [place], [address], [place], [place], [place], and [facility] K. Nash Shearer, LPC-A, [facility]  Counselor 03/03/2015 2:24 AM

## 2015-03-03 NOTE — ED Notes (Signed)
Pt ambulatory to room 30. Respirations even and unlabored, skin warm and dry, given PO fluids per request, denies further needs, in NAD, will continue to monitor.

## 2015-03-03 NOTE — Consult Note (Signed)
[facility] Consult   Reason for Consult:  Suicidal ideations Referring Physician:  EDP Patient Identification: [name] MRN:  [id number] Principal Diagnosis: Substance induced mood disorder ([place]) Diagnosis:   Patient Active Problem List   Diagnosis Date Noted  . Alcohol dependence with uncomplicated withdrawal ([place]) [F10.230] 03/03/2015    Priority: High  . Substance induced mood disorder [facility]) [F19.94] 03/03/2015    Priority: High    Total Time spent with patient: 45 minutes  Subjective:   [name] is a 59 y.o. male patient admitted with depression, alcohol dependence, suicidal ideations.  HPI:  59 yo male presented to the ED with depression, suicidal ideations, and alcohol intoxication.  History of alcohol dependence.  He has been drinking daily for the past 3 weeks, at least 5-40 oz beers.  He was staying at [facility] but got voted out.  He started drinking immediately.  Yesterday his depression increased and he started having suicidal ideations with a plan to overdose.  Past Psychiatric History: Alcohol dependence, depression  Risk to Self: Suicidal Ideation: Yes-Currently Present Suicidal Intent: Yes-Currently Present Is patient at risk for suicide?: Yes Suicidal Plan?: Yes-Currently Present Specify Current Suicidal Plan: Jump off of a bridge  Access to Means: Yes Specify Access to Suicidal Means: Access to bridge  What has been your use of drugs/alcohol within the last 12 months?: Alcohol use How many times?: 0 Other Self Harm Risks: No other self harm risk identified.  Triggers for Past Attempts: None known Intentional Self Injurious Behavior: None Risk to Others: Homicidal Ideation: No Thoughts of Harm to Others: No Current Homicidal Intent: No Current Homicidal Plan: No Access to Homicidal Means: No Identified Victim: N/A History of harm to others?: No Assessment of Violence: None Noted Violent Behavior Description: No violent  behaviors observed.  Does patient have access to weapons?: No Criminal Charges Pending?: No Does patient have a court date: No Prior Inpatient Therapy: Prior Inpatient Therapy: Yes Prior Therapy Dates: multiple previous years, 2016 Prior Therapy Facilty/Provider(s): [facility], [facility]  Reason for Treatment: Substance abuse  Prior Outpatient Therapy: Prior Outpatient Therapy: No Does patient have an ACCT team?: No Does patient have Intensive In-House Services?  : No Does patient have [facility] services? : No Does patient have P4CC services?: No  Past Medical History:  Past Medical History  Diagnosis Date  . Alcoholism /alcohol abuse ([place])   . Diabetes mellitus without complication ([place])   . Hepatitis C   . Neuropathy ([place])    History reviewed. No pertinent past surgical history. Family History: History reviewed. No pertinent family history. Family Psychiatric  History: Substance abuse Social History:  History  Alcohol Use  . Yes     History  Drug Use No    Social History   Social History  . Marital Status: Divorced    Spouse Name: N/A  . Number of Children: N/A  . Years of Education: N/A   Social History Main Topics  . Smoking status: Current Every Day Smoker -- 1.00 packs/day    Types: Cigarettes  . Smokeless tobacco: None  . Alcohol Use: Yes  . Drug Use: No  . Sexual Activity: Not Asked   Other Topics Concern  . None   Social History Narrative  . None   Additional Social History:    History of alcohol / drug use?: Yes Name of Substance 1: Alcohol  1 - Age of First Use: 14 1 - Amount (size/oz): "4-5 40oz"  1 - Frequency: daily  1 - Duration: ongoing  1 - Last Use / Amount: 03-02-15 BAL-285                   Allergies:   Allergies  Allergen Reactions  . Benadryl [Diphenhydramine] Other (See Comments)    "makes my skin crawl"  . Sulfa Antibiotics Other (See Comments)    Childhood allergy    Labs:  Results for orders placed or performed during the  hospital encounter of 03/02/15 (from the past 48 hour(s))  Urine rapid drug screen (hosp performed) (Not at [facility])     Status: None   Collection Time: 03/02/15  9:23 PM  Result Value Ref Range   Opiates NONE DETECTED NONE DETECTED   Cocaine NONE DETECTED NONE DETECTED   Benzodiazepines NONE DETECTED NONE DETECTED   Amphetamines NONE DETECTED NONE DETECTED   Tetrahydrocannabinol NONE DETECTED NONE DETECTED   Barbiturates NONE DETECTED NONE DETECTED    Comment:        DRUG SCREEN FOR MEDICAL PURPOSES ONLY.  IF CONFIRMATION IS NEEDED FOR ANY PURPOSE, NOTIFY LAB WITHIN 5 DAYS.        LOWEST DETECTABLE LIMITS FOR URINE DRUG SCREEN Drug Class       Cutoff (ng/mL) Amphetamine      1000 Barbiturate      200 Benzodiazepine   774 Tricyclics       128 Opiates          300 Cocaine          300 THC              50   Comprehensive metabolic panel     Status: Abnormal   Collection Time: 03/02/15 10:45 PM  Result Value Ref Range   Sodium 129 (L) 135 - 145 mmol/L   Potassium 4.5 3.5 - 5.1 mmol/L   Chloride 91 (L) 101 - 111 mmol/L   CO2 30 22 - 32 mmol/L   Glucose, Bld 301 (H) 65 - 99 mg/dL   BUN 10 6 - 20 mg/dL   Creatinine, Ser 0.70 0.61 - 1.24 mg/dL   Calcium 8.6 (L) 8.9 - 10.3 mg/dL   Total Protein 8.4 (H) 6.5 - 8.1 g/dL   Albumin 3.8 3.5 - 5.0 g/dL   AST 131 (H) 15 - 41 U/L   ALT 122 (H) 17 - 63 U/L   Alkaline Phosphatase 130 (H) 38 - 126 U/L   Total Bilirubin 0.9 0.3 - 1.2 mg/dL   GFR calc non Af Amer >60 >60 mL/min   GFR calc Af Amer >60 >60 mL/min    Comment: (NOTE) The eGFR has been calculated using the CKD EPI equation. This calculation has not been validated in all clinical situations. eGFR's persistently <60 mL/min signify possible Chronic Kidney Disease.    Anion gap 8 5 - 15  Ethanol (ETOH)     Status: Abnormal   Collection Time: 03/02/15 10:45 PM  Result Value Ref Range   Alcohol, Ethyl (B) 285 (H) <5 mg/dL    Comment:        LOWEST DETECTABLE LIMIT FOR SERUM  ALCOHOL IS 5 mg/dL FOR MEDICAL PURPOSES ONLY   Salicylate level     Status: None   Collection Time: 03/02/15 10:45 PM  Result Value Ref Range   Salicylate Lvl <7.8 2.8 - 30.0 mg/dL  Acetaminophen level     Status: Abnormal   Collection Time: 03/02/15 10:45 PM  Result Value Ref Range   Acetaminophen (Tylenol), Serum <10 (L) 10 -  30 ug/mL    Comment:        THERAPEUTIC CONCENTRATIONS VARY SIGNIFICANTLY. A RANGE OF 10-30 ug/mL MAY BE AN EFFECTIVE CONCENTRATION FOR MANY PATIENTS. HOWEVER, SOME ARE BEST TREATED AT CONCENTRATIONS OUTSIDE THIS RANGE. ACETAMINOPHEN CONCENTRATIONS >150 ug/mL AT 4 HOURS AFTER INGESTION AND >50 ug/mL AT 12 HOURS AFTER INGESTION ARE OFTEN ASSOCIATED WITH TOXIC REACTIONS.   CBC     Status: Abnormal   Collection Time: 03/02/15 10:45 PM  Result Value Ref Range   WBC 6.7 4.0 - 10.5 K/uL   RBC 4.13 (L) 4.22 - 5.81 MIL/uL   Hemoglobin 14.2 13.0 - 17.0 g/dL   HCT 40.1 39.0 - 52.0 %   MCV 97.1 78.0 - 100.0 fL   MCH 34.4 (H) 26.0 - 34.0 pg   MCHC 35.4 30.0 - 36.0 g/dL   RDW 13.5 11.5 - 15.5 %   Platelets 80 (L) 150 - 400 K/uL    Comment: RESULT REPEATED AND VERIFIED SPECIMEN CHECKED FOR CLOTS PLATELET COUNT CONFIRMED BY SMEAR     Current Facility-Administered Medications  Medication Dose Route Frequency Provider Last Rate Last Dose  . alum & mag hydroxide-simeth (MAALOX/MYLANTA) 200-200-20 MG/5ML suspension 30 mL  30 mL Oral PRN Mercedes Camprubi-Soms, PA-C      . gabapentin (NEURONTIN) capsule 600 mg  600 mg Oral TID Mercedes Camprubi-Soms, PA-C   600 mg at 03/03/15 1029  . ibuprofen (ADVIL,MOTRIN) tablet 600 mg  600 mg Oral Q8H PRN Mercedes Camprubi-Soms, PA-C      . LORazepam (ATIVAN) tablet 0-4 mg  0-4 mg Oral 4 times per day Mercedes Camprubi-Soms, PA-C   Stopped at 03/03/15 0612  . LORazepam (ATIVAN) tablet 0-4 mg  0-4 mg Oral Q12H Mercedes Camprubi-Soms, PA-C   1 mg at 03/03/15 0840  . LORazepam (ATIVAN) tablet 1 mg  1 mg Oral Q8H PRN Mercedes  Camprubi-Soms, PA-C      . metFORMIN (GLUCOPHAGE) tablet 1,000 mg  1,000 mg Oral BID WC Mercedes Camprubi-Soms, PA-C   1,000 mg at 03/03/15 0837  . multivitamin with minerals tablet 1 tablet  1 tablet Oral Daily Mercedes Camprubi-Soms, PA-C   1 tablet at 03/03/15 1029  . nicotine (NICODERM CQ - dosed in mg/24 hours) patch 21 mg  21 mg Transdermal Daily Mercedes Camprubi-Soms, PA-C   21 mg at 03/03/15 1150  . ondansetron (ZOFRAN) tablet 4 mg  4 mg Oral Q8H PRN Mercedes Camprubi-Soms, PA-C      . thiamine (VITAMIN B-1) tablet 100 mg  100 mg Oral Daily Mercedes Camprubi-Soms, PA-C   100 mg at 03/03/15 1029  . zolpidem (AMBIEN) tablet 5 mg  5 mg Oral QHS PRN Mercedes Camprubi-Soms, PA-C       Current Outpatient Prescriptions  Medication Sig Dispense Refill  . gabapentin (NEURONTIN) 300 MG capsule Take 600 mg by mouth 3 (three) times daily.    . metFORMIN (GLUCOPHAGE) 1000 MG tablet Take 1,000 mg by mouth 2 (two) times daily with a meal.    . Multiple Vitamin (MULTIVITAMIN WITH MINERALS) TABS tablet Take 1 tablet by mouth daily.      Musculoskeletal: Strength & Muscle Tone: within normal limits Gait & Station: normal Patient leans: N/A  Psychiatric Specialty Exam: Review of Systems  Constitutional: Negative.   HENT: Negative.   Eyes: Negative.   Respiratory: Negative.   Cardiovascular: Negative.   Gastrointestinal: Negative.   Genitourinary: Negative.   Musculoskeletal: Negative.   Skin: Negative.   Neurological: Negative.   Endo/Heme/Allergies: Negative.   Psychiatric/Behavioral:  Positive for depression, suicidal ideas and substance abuse.    Blood pressure 170/90, pulse 86, temperature 97.4 F (36.3 C), temperature source Oral, resp. rate 18, SpO2 94 %.There is no height or weight on file to calculate BMI.  General Appearance: Disheveled  Eye Sport and exercise psychologist::  Fair  Speech:  Normal Rate  Volume:  Normal  Mood:  Anxious and Depressed  Affect:  Congruent  Thought Process:  Coherent   Orientation:  Full (Time, Place, and Person)  Thought Content:  Rumination  Suicidal Thoughts:  Yes.  with intent/plan  Homicidal Thoughts:  No  Memory:  Immediate;   Fair Recent;   Fair Remote;   Fair  Judgement:  Impaired  Insight:  Fair  Psychomotor Activity:  Decreased  Concentration:  Fair  Recall:  [employer] of Knowledge:Fair  Language: Fair  Akathisia:  No  Handed:  Right  AIMS (if indicated):     Assets:  Leisure Time Physical Health Resilience  ADL's:  Intact  Cognition: WNL  Sleep:      Treatment Plan Summary: Daily contact with patient to assess and evaluate symptoms and progress in treatment, Medication management and Plan substance induced mood disorder:  -Crisis stabilization -Medication management:  Prozac 20 mg daily for depression -Individual counseling  Alcohol dependence with uncomplicated withdrawal: -Substance abuse counseling -Ativan Detox Protocol initiated -Multivitamin and fluids IV started  Disposition: Recommend psychiatric Inpatient admission when medically cleared.  Waylan Boga, [place] 03/03/2015 3:37 PM Patient seen face-to-face for psychiatric evaluation, chart reviewed and case discussed with the physician extender and developed treatment plan. Reviewed the information documented and agree with the treatment plan. Corena Pilgrim, MD

## 2015-03-03 NOTE — BH Assessment (Signed)
Assessment completed. Consulted Arlester Marker, NP who recommended inpatient treatment.

## 2015-03-03 NOTE — ED Notes (Signed)
Patient in bed eatting dinner.  States has neuropathy in both feet and it is a constant. Slight tremor noted in both hands.  Patient states tremors are worst when he is reaching for something.  Denies SI/HI/AVH.  Will continue to monitor for safety with every 15 minutes rounds.

## 2015-03-04 ENCOUNTER — Inpatient Hospital Stay
Admission: EM | Admit: 2015-03-04 | Discharge: 2015-03-11 | DRG: 885 | Disposition: A | Discharge status: Home / Self Care | Payer: No Typology Code available for payment source | Source: Other Acute Inpatient Hospital | Attending: Psychiatry | Admitting: Psychiatry

## 2015-03-04 ENCOUNTER — Encounter (HOSPITAL_COMMUNITY): Payer: Self-pay | Admitting: Nurse Practitioner

## 2015-03-04 DIAGNOSIS — F17200 Nicotine dependence, unspecified, uncomplicated: Secondary | ICD-10-CM | POA: Diagnosis present

## 2015-03-04 DIAGNOSIS — R45851 Suicidal ideations: Secondary | ICD-10-CM | POA: Diagnosis present

## 2015-03-04 DIAGNOSIS — Z590 Homelessness: Secondary | ICD-10-CM | POA: Diagnosis not present

## 2015-03-04 DIAGNOSIS — Z888 Allergy status to other drugs, medicaments and biological substances status: Secondary | ICD-10-CM | POA: Diagnosis not present

## 2015-03-04 DIAGNOSIS — F10239 Alcohol dependence with withdrawal, unspecified: Secondary | ICD-10-CM | POA: Diagnosis present

## 2015-03-04 DIAGNOSIS — G4700 Insomnia, unspecified: Secondary | ICD-10-CM | POA: Diagnosis present

## 2015-03-04 DIAGNOSIS — E1165 Type 2 diabetes mellitus with hyperglycemia: Secondary | ICD-10-CM | POA: Diagnosis present

## 2015-03-04 DIAGNOSIS — Z882 Allergy status to sulfonamides status: Secondary | ICD-10-CM | POA: Diagnosis not present

## 2015-03-04 DIAGNOSIS — F419 Anxiety disorder, unspecified: Secondary | ICD-10-CM | POA: Diagnosis present

## 2015-03-04 DIAGNOSIS — E1140 Type 2 diabetes mellitus with diabetic neuropathy, unspecified: Secondary | ICD-10-CM | POA: Diagnosis present

## 2015-03-04 DIAGNOSIS — F321 Major depressive disorder, single episode, moderate: Secondary | ICD-10-CM

## 2015-03-04 DIAGNOSIS — E119 Type 2 diabetes mellitus without complications: Secondary | ICD-10-CM

## 2015-03-04 DIAGNOSIS — F515 Nightmare disorder: Secondary | ICD-10-CM | POA: Diagnosis present

## 2015-03-04 LAB — COMPREHENSIVE METABOLIC PANEL
ALBUMIN: 3.2 g/dL — AB (ref 3.5–5.0)
ALK PHOS: 133 U/L — AB (ref 38–126)
ALT: 76 U/L — ABNORMAL HIGH (ref 17–63)
ANION GAP: 8 (ref 5–15)
AST: 69 U/L — ABNORMAL HIGH (ref 15–41)
BILIRUBIN TOTAL: 1 mg/dL (ref 0.3–1.2)
BUN: 13 mg/dL (ref 6–20)
CALCIUM: 9.2 mg/dL (ref 8.9–10.3)
CO2: 29 mmol/L (ref 22–32)
CREATININE: 0.62 mg/dL (ref 0.61–1.24)
Chloride: 92 mmol/L — ABNORMAL LOW (ref 101–111)
GFR calc non Af Amer: >60 mL/min (ref >60)
GLUCOSE: 369 mg/dL — AB (ref 65–99)
Potassium: 4.7 mmol/L (ref 3.5–5.1)
Sodium: 129 mmol/L — ABNORMAL LOW (ref 135–145)
TOTAL PROTEIN: 7 g/dL (ref 6.5–8.1)

## 2015-03-04 MED ORDER — TRAZODONE HCL 50 MG PO TABS
50.0000 mg | ORAL_TABLET | Freq: Every evening | ORAL | Status: DC | PRN
  Administered 2015-03-07: 50 mg via ORAL
  Filled 2015-03-04 (×2): qty 1

## 2015-03-04 MED ORDER — FOLIC ACID 1 MG PO TABS
1.0000 mg | ORAL_TABLET | Freq: Every day | ORAL | Status: DC
  Administered 2015-03-04 – 2015-03-10 (×7): 1 mg via ORAL
  Filled 2015-03-04 (×7): qty 1

## 2015-03-04 MED ORDER — MAGNESIUM HYDROXIDE 400 MG/5ML PO SUSP: 30.0000 mL | Freq: Every day | ORAL | Status: DC | PRN

## 2015-03-04 MED ORDER — ADULT MULTIVITAMIN W/MINERALS CH
1.0000 | ORAL_TABLET | Freq: Every day | ORAL | Status: DC
  Administered 2015-03-04 – 2015-03-10 (×7): 1 via ORAL
  Filled 2015-03-04 (×7): qty 1

## 2015-03-04 MED ORDER — LORAZEPAM 1 MG PO TABS
1.0000 mg | ORAL_TABLET | Freq: Four times a day (QID) | ORAL | Status: AC | PRN
  Administered 2015-03-04 – 2015-03-06 (×4): 1 mg via ORAL
  Filled 2015-03-04 (×6): qty 1

## 2015-03-04 MED ORDER — VITAMIN B-1 100 MG PO TABS
100.0000 mg | ORAL_TABLET | Freq: Every day | ORAL | Status: DC
  Administered 2015-03-04 – 2015-03-10 (×7): 100 mg via ORAL
  Filled 2015-03-04 (×7): qty 1

## 2015-03-04 MED ORDER — TRAZODONE HCL 100 MG PO TABS: 100.0000 mg | ORAL_TABLET | Freq: Every evening | ORAL | Status: DC | PRN

## 2015-03-04 MED ORDER — THIAMINE HCL 100 MG/ML IJ SOLN: 100.0000 mg | Freq: Every day | INTRAMUSCULAR | Status: DC

## 2015-03-04 MED ORDER — ALUM & MAG HYDROXIDE-SIMETH 200-200-20 MG/5ML PO SUSP: 30.0000 mL | ORAL | Status: DC | PRN

## 2015-03-04 MED ORDER — ACETAMINOPHEN 325 MG PO TABS: 650.0000 mg | ORAL_TABLET | Freq: Four times a day (QID) | ORAL | Status: DC | PRN

## 2015-03-04 MED ORDER — LORAZEPAM 2 MG/ML IJ SOLN: 1.0000 mg | Freq: Four times a day (QID) | INTRAMUSCULAR | Status: AC | PRN

## 2015-03-04 NOTE — ED Notes (Signed)
Report given to [facility] at [facility], pt to transfer to room 319.   All discharge paperwork placed in [place] folder and given to transporter.  Belongings returned to the pt.  Verbal understanding expressed, pt given opportunity to express concerns and ask questions.  Denies SI and A/V hallucinations. Denies pain.

## 2015-03-04 NOTE — Progress Notes (Signed)
EDCM spoke to patient at bedside. Patient confirms he does not have a pcp or insurance living in [place].  [facility] provide patient with contact infromation to [facility], informed patient of services there .  EDCM also provided patient with list of pcps who accept self pay patients, list of discount pharmacies and websites needymeds.org and GoodRX.com for medication assistance, phone number to inquire about the orange card, phone number to inquire about Mediciad, phone number to inquire about the [place], financial resources in the community such as local churches, salvation army, urban ministries, and dental assistance for uninsured patients.  Patient reports he has the orange card, but it has expired.  Patient also reports he is seen at the [facility].  [facility] encouraged patient to re enroll for orange card and to continue going to [facility]. Patient thankful for resources.  No further EDCM needs at this time.

## 2015-03-04 NOTE — Progress Notes (Signed)
Inpatient Diabetes Program Recommendations  AACE/ADA: New Consensus Statement on Inpatient Glycemic Control (2015)  Target Ranges:  Prepandial:   less than 140 mg/dL      Peak postprandial:   less than 180 mg/dL (1-2 hours)      Critically ill patients:  140 - 180 mg/dL   Review of Glycemic Control  Diabetes history: DM2 Outpatient Diabetes medications: metformin 1000 mg bid Current orders for Inpatient glycemic control: metformin 1000 mg bid  Results for JAKORY, MATSUO (MRN [id number]) as of 03/04/2015 16:18  Ref. Range 03/03/2015 20:44  Glucose-Capillary Latest Ref Range: 65-99 mg/dL 245 (H)  Results for RITHIK, ODEA (MRN [id number]) as of 03/04/2015 16:18  Ref. Range 03/02/2015 22:45 03/04/2015 14:12  Glucose Latest Ref Range: 65-99 mg/dL 301 (H) 369 (H)  Needs basal/bolus insulin.  Inpatient Diabetes Program Recommendations:     Add Novolog moderate tidwc and hs. If FBS or am CBG > 180 mg/dL, add Lantus 10 units QAM. Need weight on patient for dosing.   Will follow daily. Thank you. Lorenda Peck, RD, LDN, CDE Inpatient Diabetes Coordinator [phone]

## 2015-03-04 NOTE — Plan of Care (Signed)
Problem: Consults Goal: Substance Abuse Patient Education See Patient Education Module for education specifics.  Outcome: Not Progressing Patient newly admitted but is motivated for substance abuse treatment.  Problem: Alteration in mood & ability to function due to Goal: LTG-Pt reports reduction in suicidal thoughts (Patient reports reduction in suicidal thoughts and is able to verbalize a safety plan for whenever patient is feeling suicidal)  Outcome: Progressing Patient denies any SI/HI/AVH at this time.

## 2015-03-04 NOTE — BH Assessment (Signed)
03/04/2015 Reassessment:  Writer completed a reassessment for this patient today. Patient denies SI, HI, and AVH's. Patient admits to making suicidal comments upon presentation to the [facility]. Sts, "I only said I was suicidal to get in at the hospital". Patient explains that he was a resident at the [facility] x3 months. He was not getting along with other residents so patient was kicked out. Sts, "The other people in the home would not clean, pay rent, or follow the rules". Patient is now homeless and recently relapsed on alcohol. Patient is motivated to continue seeking detox. Sts that he is having withdrawal symptoms such as stomach cramps, nausea, irritation, and tremors.   Per Dr. Darleene Cleaver and May, NP, patient meets criteria for inpatient treatment. Patient is a candidate for a 300 hall bed at [facility].

## 2015-03-04 NOTE — BH Assessment (Signed)
[facility] Assessment Progress Note  Per Corena Pilgrim, MD, this pt requires psychiatric hospitalization at this time. Per Elmyra Ricks at [facility], pt has been accepted to their facility by Dr Jimmye Norman to Rm 319B. Voncille Lo, NP concurs with this decision. Pt has signed Voluntary Admission and Consent for Treatment, as well as Consent to Release Information to [facility], his probation officer, and signed forms have been faxed to [phone]. Pt's nurse has been notified, and agrees to send original paperwork along with pt via [place], and to call report to [phone].  At the request of the pt this writer called Valentino Hue [phone]) at 16:02 and left a notification message for him on voice mail.  Jalene Mullet, [place] Triage Specialist [phone]

## 2015-03-04 NOTE — Tx Team (Signed)
Initial Interdisciplinary Treatment Plan   PATIENT STRESSORS: Financial difficulties Health problems Substance abuse   PATIENT STRENGTHS: Ability for insight Capable of independent living   PROBLEM LIST: Problem List/Patient Goals Date to be addressed Date deferred Reason deferred Estimated date of resolution  Alcohol abuse 03/04/15     Stable housing 03/04/15                                                DISCHARGE CRITERIA:  Ability to meet basic life and health needs Improved stabilization in mood, thinking, and/or behavior  PRELIMINARY DISCHARGE PLAN: Attend 12-step recovery group Return to previous living arrangement  PATIENT/FAMIILY INVOLVEMENT: This treatment plan has been presented to and reviewed with the patient, [name], and/or family member,   The patient and family have been given the opportunity to ask questions and make suggestions.  Alajah Witman E Danene Montijo 03/04/2015, 6:21 PM

## 2015-03-04 NOTE — Progress Notes (Signed)
D: Patient is a 59 y/o male here voluntarily for alcohol detox. Patient was living in [facility]. Patient reports drinking 5-40 oz bottles of alcohol daily. Patient is alert oriented x3 and denies SI/HI/AVH. Patient has history of Type II diabetes.  A: Patient arrived on the unit and searched with no contraband found. Skin assessment complete. Patient given a tour of the unit.  R: Patient voiced no other concerns.

## 2015-03-04 NOTE — Progress Notes (Addendum)
Pending review for possible placement with [facility]. H&P and Assessment have been faxed to the [facility] for the charge nurse to review.    03/04/2015 Con Memos, MS, [place], LPCA Therapeutic Triage Specialist

## 2015-03-05 DIAGNOSIS — F10230 Alcohol dependence with withdrawal, uncomplicated: Secondary | ICD-10-CM

## 2015-03-05 DIAGNOSIS — F329 Major depressive disorder, single episode, unspecified: Secondary | ICD-10-CM

## 2015-03-05 LAB — GLUCOSE, CAPILLARY: Glucose-Capillary: 312 mg/dL — ABNORMAL HIGH (ref 65–99)

## 2015-03-05 MED ORDER — LORAZEPAM 1 MG PO TABS
1.0000 mg | ORAL_TABLET | ORAL | Status: AC
  Administered 2015-03-05 – 2015-03-06 (×6): 1 mg via ORAL
  Filled 2015-03-05 (×4): qty 1

## 2015-03-05 MED ORDER — INSULIN ASPART 100 UNIT/ML ~~LOC~~ SOLN
2.0000 [IU] | SUBCUTANEOUS | Status: DC
  Administered 2015-03-05 – 2015-03-06 (×5): 6 [IU] via SUBCUTANEOUS
  Filled 2015-03-05 (×5): qty 6

## 2015-03-05 MED ORDER — METFORMIN HCL 500 MG PO TABS
1000.0000 mg | ORAL_TABLET | Freq: Two times a day (BID) | ORAL | Status: DC
  Administered 2015-03-05 – 2015-03-11 (×12): 1000 mg via ORAL
  Filled 2015-03-05 (×12): qty 2

## 2015-03-05 MED ORDER — LORAZEPAM 1 MG PO TABS
1.0000 mg | ORAL_TABLET | Freq: Two times a day (BID) | ORAL | Status: AC
  Administered 2015-03-07 (×2): 1 mg via ORAL
  Filled 2015-03-05 (×2): qty 1

## 2015-03-05 MED ORDER — LORAZEPAM 1 MG PO TABS
1.0000 mg | ORAL_TABLET | Freq: Four times a day (QID) | ORAL | Status: AC
Start: 2015-03-06 — End: 2015-03-07
  Administered 2015-03-06 – 2015-03-07 (×3): 1 mg via ORAL
  Filled 2015-03-05 (×3): qty 1

## 2015-03-05 MED ORDER — GABAPENTIN 600 MG PO TABS
600.0000 mg | ORAL_TABLET | Freq: Three times a day (TID) | ORAL | Status: DC
  Administered 2015-03-05 – 2015-03-09 (×14): 600 mg via ORAL
  Filled 2015-03-05 (×14): qty 1

## 2015-03-05 NOTE — BHH Suicide Risk Assessment (Signed)
[facility] Admission Suicide Risk Assessment   Nursing information obtained from:    Demographic factors:    Current Mental Status:    Loss Factors:    Historical Factors:    Risk Reduction Factors:    Total Time spent with patient: 1 hour Principal Problem: <principal problem not specified> Diagnosis:   Patient Active Problem List   Diagnosis Date Noted  . Depression [F32.9] 03/04/2015  . Alcohol dependence with uncomplicated withdrawal ([place]) [F10.230] 03/03/2015  . Substance induced mood disorder ([place]) [F19.94] 03/03/2015  . Alcohol abuse [F10.10]   . Suicidal ideation [R45.851]      Continued Clinical Symptoms:  Alcohol Use Disorder Identification Test Final Score (AUDIT): 19 The "Alcohol Use Disorders Identification Test", Guidelines for Use in Primary Care, Second Edition.  World Pharmacologist [facility]). Score between 0-7:  no or low risk or alcohol related problems. Score between 8-15:  moderate risk of alcohol related problems. Score between 16-19:  high risk of alcohol related problems. Score 20 or above:  warrants further diagnostic evaluation for alcohol dependence and treatment.   CLINICAL FACTORS:   Alcohol/Substance Abuse/Dependencies Chronic Pain   Musculoskeletal: Strength & Muscle Tone: within normal limits Gait & Station: normal Patient leans: N/A  Psychiatric Specialty Exam: Physical Exam  ROS  Blood pressure 139/72, pulse 87, temperature 98.5 F (36.9 C), temperature source Oral, resp. rate 18, height 6' (1.829 m), weight 72.576 kg (160 lb), SpO2 100 %.Body mass index is 21.7 kg/(m^2).  See H&P                                                        COGNITIVE FEATURES THAT CONTRIBUTE TO RISK:  None    SUICIDE RISK:   Minimal: No identifiable suicidal ideation.  Patients presenting with no risk factors but with morbid ruminations; may be classified as minimal risk based on the severity of the depressive symptoms. She denies  any suicidal ideation, past suicide attempts and it appears his biggest risk factor is his alcohol use disorder. Will target outpatient plans for this issue.  PLAN OF CARE:  Alcohol use disorder, severe-patient will be on CIWA protocol. We will also start him on some standing Ativan given his reports of problematic withdrawals in the past.   Depression-this appears related to his alcohol and thus we will monitor this or any signs symptoms of a separate depressive disorder  Diabetes-metformin 1000 oh grams twice a day  Neuropathy-Neurontin 600 mg 3 times a day  Insomnia- trazodone as needed  Dispo-ones through detox will assess for referral to residential program or intensive outpatient treatment. Medical Decision Making:  Established Problem, Worsening (2) and Review of Medication Regimen & Side Effects (2)  I certify that inpatient services furnished can reasonably be expected to improve the patient's condition.   Faith Rogue 03/05/2015, 12:18 PM

## 2015-03-05 NOTE — Plan of Care (Signed)
Problem: Ineffective individual coping Goal: STG: Patient will remain free from self harm Outcome: Progressing Medications administered as ordered by the physician, medications Therapeutic Effects, SEs and Adverse effects discussed, questions encouraged; no PRN given, 15 minute checks maintained for safety, clinical and moral support provided, patient encouraged to continue to express feelings and demonstrate safe care. Patient remain free from harm, will continue to monitor.

## 2015-03-05 NOTE — Progress Notes (Signed)
CIWA=4, Twice assessed, Ativan 1 mg given twice, demonstrating med seeking behavior, Slept for 7.45 hours

## 2015-03-05 NOTE — Progress Notes (Addendum)
Inpatient Diabetes Program Recommendations  AACE/ADA: New Consensus Statement on Inpatient Glycemic Control (2015)  Target Ranges:  Prepandial:   less than 140 mg/dL      Peak postprandial:   less than 180 mg/dL (1-2 hours)      Critically ill patients:  140 - 180 mg/dL  Results for [name], [name] (MRN [id number]) as of 03/05/2015 09:54  Ref. Range 03/02/2015 22:45 03/04/2015 14:12  Glucose Latest Ref Range: 65-99 mg/dL 301 (H) 369 (H)   Results for [name], [name] (MRN [id number]) as of 03/05/2015 09:54  Ref. Range 03/03/2015 20:44  Glucose-Capillary Latest Ref Range: 65-99 mg/dL 245 (H)  Review of Glycemic Control  Diabetes history: DM2 Outpatient Diabetes medications: Metformin 1000 mg BID Current orders for Inpatient glycemic control: None  Inpatient Diabetes Program Recommendations: Correction (SSI): Patient has a history of DM2 and takes oral DM medications as an outpatient. While inpatient, please consider ordering CBGs with Novolog correction ACHS using the Glycemic control order set. Oral Agents: Please consider ordering Metformin 1000 mg BID.  03/05/15_0 :00-Discussed recommendations with Silva Bandy, RN and asked that she discuss with MD.  Thanks, Barnie Alderman, RN, MSN, CCRN, CDE Diabetes Coordinator Inpatient Diabetes Program [phone] (Team Pager from [place] to [place]) [phone] ([facility] office) [phone] [facility] office) [phone] [facility] office)

## 2015-03-05 NOTE — Progress Notes (Signed)
03/05/15 1000  Clinical Encounter Type  Visited With Patient not available  Visit Type Initial  Referral From Nurse  Consult/Referral To Chaplain  Spiritual Encounters  Spiritual Needs Other (Comment) (Referral made by intake rubric questions. )  Consult was generated by rubric questions. Pt. was not interested in speaking with chaplain. Advised staff to contact me if pt. later decides to desire visit. Chap. Raychelle Hudman G. [place]

## 2015-03-05 NOTE — Progress Notes (Signed)
CBG=312, 6 Units insulin aspart (novoLOG) given, CIWA Score is  4, disheveled in scrubs, appeared dehydrated and fluid encouraged. Will obtain/assess CBG and CIWA every 4 hours. Mood and Affect appropriate and improving. Denied SI/HI, denied AV/H.

## 2015-03-05 NOTE — Progress Notes (Signed)
Recreation Therapy Notes  Date: 10.26.16 Time: 3:00 pm Location: Craft Room  Group Topic: Self-esteem  Goal Area(s) Addresses:  Patient will write at least one positive trait. Patient will verbalize benefit of having a healthy self-esteem.  Behavioral Response: Did not attend  Intervention: I Am  Activity: Patients were given a worksheet with the letter I on it and instructed to fill the letter with as many positive traits about themselves as they could.  Education:LRT educated patients on ways they can increase their self-esteem.   Education Outcome: Patient did not attend group.   Clinical Observations/Feedback: Patient did not attend group.  Leonette Monarch, LRT/CTRS 03/05/2015 4:37 PM

## 2015-03-05 NOTE — Plan of Care (Signed)
Problem: Alteration in mood & ability to function due to Goal: LTG-Patient demonstrates decreased signs of withdrawal (Patient demonstrates decreased signs of withdrawal to the point the patient is safe to return home and continue treatment in an outpatient setting)  Outcome: Not Progressing Irritable.  Verbalizes that "I'm detoxing, I need my ativan"  Presents at at the time ativan is due.

## 2015-03-05 NOTE — H&P (Signed)
Psychiatric Admission Assessment Adult  Patient Identification: [name] MRN:  [id number] Date of Evaluation:  03/05/2015 Chief Complaint:  major depression alcohol use disorder "I had made at 3 months without drinking." Principal Diagnosis: Alcohol use disorder, severe Diagnosis:  Alcohol use disorder, severe, substance-induced mood disorder Patient Active Problem List   Diagnosis Date Noted  . Depression [F32.9] 03/04/2015  . Alcohol dependence with uncomplicated withdrawal ([place]) [F10.230] 03/03/2015  . Substance induced mood disorder ([place]) [F19.94] 03/03/2015  . Alcohol abuse [F10.10]   . Suicidal ideation [R45.851]    History of Present Illness:: Patient indicates that he has had issues with alcohol for the past 28 years. He states that he ate recently ended a 3 month period of sobriety. He states things were going well he was living in [place] and working full-time in [employer]. He states that what started his relapse was that he was a senior member of the [employer] and tried to enforce the rules on newer members. He states that eventually these newer members grew and devoted him out of the house. He states he has been out of the [place] for 3 weeks and that is when his alcohol use resumed. He states he's drinking 5-640 ounce beers a day.  When asked about depressive symptoms he denies issues with depression outside of when he is drinking. He denies issues with anxiety outside of when he is drinking. He states these are currently issues but largely associates them with his alcohol use. He denies any suicidal ideation or any past suicide attempts. Associated Signs/Symptoms: Depression Symptoms:  depressed mood, anhedonia, insomnia, disturbed sleep, (Hypo) Manic Symptoms:  None Anxiety Symptoms:  Some anxiety related to being away from alcohol and per patient report withdrawal Psychotic Symptoms:  None PTSD Symptoms: Negative Total Time spent with patient: 1  hour  Past Psychiatric History: Patient reports most recent treatment was at our [facility] 4 months ago. He states he was also at [facility]. He states his longest period of sobriety was 6 years when he was raising his young children. He believes he's been in treatment facilities 20 times. He denies any suicide attempts. He denies having any counseling or medication management states that he's never had mood disturbance aside from when he is drinking  Risk to Self: Is patient at risk for suicide?: No Risk to Others:   Prior Inpatient Therapy:   Prior Outpatient Therapy:    Alcohol Screening: 1. How often do you have a drink containing alcohol?: 4 or more times a week 2. How many drinks containing alcohol do you have on a typical day when you are drinking?: 10 or more 3. How often do you have six or more drinks on one occasion?: Daily or almost daily Preliminary Score: 8 4. How often during the last year have you found that you were not able to stop drinking once you had started?: Monthly 5. How often during the last year have you failed to do what was normally expected from you becasue of drinking?: Monthly 6. How often during the last year have you needed a first drink in the morning to get yourself going after a heavy drinking session?: Less than monthly 7. How often during the last year have you had a feeling of guilt of remorse after drinking?: Never 8. How often during the last year have you been unable to remember what happened the night before because you had been drinking?: Monthly 9. Have you or someone else been injured as  a result of your drinking?: No 10. Has a relative or friend or a doctor or another health worker been concerned about your drinking or suggested you cut down?: No Alcohol Use Disorder Identification Test Final Score (AUDIT): 19 Brief Intervention: Patient declined brief intervention Substance Abuse History in the last 12 months:  Yes.   Consequences of Substance  Abuse: Patient indicates he has had social consequences from his drinking. Previous Psychotropic Medications: No  Psychological Evaluations: No  Past Medical History:  Past Medical History  Diagnosis Date  . Alcoholism /alcohol abuse ([place])   . Diabetes mellitus without complication ([place])   . Hepatitis C   . Neuropathy ([place])    History reviewed. No pertinent past surgical history. Family History: History reviewed. No pertinent family history. Family Psychiatric  History: He denies any knowledge of any family history of mental illness Social History:  History  Alcohol Use  . Yes     History  Drug Use No    Social History   Social History  . Marital Status: Divorced    Spouse Name: N/A  . Number of Children: N/A  . Years of Education: N/A   Social History Main Topics  . Smoking status: Current Every Day Smoker -- 1.00 packs/day    Types: Cigarettes  . Smokeless tobacco: None  . Alcohol Use: Yes  . Drug Use: No  . Sexual Activity: Not Asked   Other Topics Concern  . None   Social History Narrative   Additional Social History:                         Allergies:   Allergies  Allergen Reactions  . Benadryl [Diphenhydramine] Other (See Comments)    "makes my skin crawl"  . Sulfa Antibiotics Other (See Comments)    Childhood allergy   Lab Results:  Results for orders placed or performed during the hospital encounter of 03/02/15 (from the past 48 hour(s))  CBG monitoring, ED     Status: Abnormal   Collection Time: 03/03/15  8:44 PM  Result Value Ref Range   Glucose-Capillary 245 (H) 65 - 99 mg/dL  Comprehensive metabolic panel     Status: Abnormal   Collection Time: 03/04/15  2:12 PM  Result Value Ref Range   Sodium 129 (L) 135 - 145 mmol/L   Potassium 4.7 3.5 - 5.1 mmol/L   Chloride 92 (L) 101 - 111 mmol/L   CO2 29 22 - 32 mmol/L   Glucose, Bld 369 (H) 65 - 99 mg/dL   BUN 13 6 - 20 mg/dL   Creatinine, Ser 0.62 0.61 - 1.24 mg/dL   Calcium 9.2 8.9  - 10.3 mg/dL   Total Protein 7.0 6.5 - 8.1 g/dL   Albumin 3.2 (L) 3.5 - 5.0 g/dL   AST 69 (H) 15 - 41 U/L   ALT 76 (H) 17 - 63 U/L   Alkaline Phosphatase 133 (H) 38 - 126 U/L   Total Bilirubin 1.0 0.3 - 1.2 mg/dL   GFR calc non Af Amer >60 >60 mL/min   GFR calc Af Amer >60 >60 mL/min    Comment: (NOTE) The eGFR has been calculated using the CKD EPI equation. This calculation has not been validated in all clinical situations. eGFR's persistently <60 mL/min signify possible Chronic Kidney Disease.    Anion gap 8 5 - 15    Metabolic Disorder Labs:  No results found for: HGBA1C, MPG No results found for: PROLACTIN No  results found for: CHOL, TRIG, HDL, CHOLHDL, VLDL, LDLCALC  Current Medications: Current Facility-Administered Medications  Medication Dose Route Frequency Provider Last Rate Last Dose  . acetaminophen (TYLENOL) tablet 650 mg  650 mg Oral Q6H PRN Marjie Skiff, MD      . alum & mag hydroxide-simeth (MAALOX/MYLANTA) 200-200-20 MG/5ML suspension 30 mL  30 mL Oral Q4H PRN Marjie Skiff, MD      . folic acid (FOLVITE) tablet 1 mg  1 mg Oral Daily Marjie Skiff, MD   1 mg at 03/05/15 0946  . gabapentin (NEURONTIN) tablet 600 mg  600 mg Oral TID Marjie Skiff, MD      . LORazepam (ATIVAN) tablet 1 mg  1 mg Oral Q6H PRN Marjie Skiff, MD   1 mg at 03/05/15 0946   Or  . LORazepam (ATIVAN) injection 1 mg  1 mg Intravenous Q6H PRN Marjie Skiff, MD      . LORazepam (ATIVAN) tablet 1 mg  1 mg Oral 6 times per day Marjie Skiff, MD       Followed by  . [START ON 03/06/2015] LORazepam (ATIVAN) tablet 1 mg  1 mg Oral 4 times per day Marjie Skiff, MD       Followed by  . [START ON 03/07/2015] LORazepam (ATIVAN) tablet 1 mg  1 mg Oral BID Marjie Skiff, MD      . magnesium hydroxide (MILK OF MAGNESIA) suspension 30 mL  30 mL Oral Daily PRN Marjie Skiff, MD      . metFORMIN (GLUCOPHAGE) tablet 1,000 mg  1,000 mg Oral BID WC Marjie Skiff, MD      .  multivitamin with minerals tablet 1 tablet  1 tablet Oral Daily Marjie Skiff, MD   1 tablet at 03/05/15 [phone]  . thiamine (VITAMIN B-1) tablet 100 mg  100 mg Oral Daily Marjie Skiff, MD   100 mg at 03/05/15 6546   Or  . thiamine (B-1) injection 100 mg  100 mg Intravenous Daily Marjie Skiff, MD      . traZODone (DESYREL) tablet 50 mg  50 mg Oral QHS PRN Marjie Skiff, MD       PTA Medications: No prescriptions prior to admission    Musculoskeletal: Strength & Muscle Tone: within normal limits Gait & Station: normal Patient leans: N/A  Psychiatric Specialty Exam: Physical Exam  Review of Systems  Musculoskeletal:       He reports neuropathic pain in his feet that response to Neurontin  Psychiatric/Behavioral: Positive for substance abuse. Negative for depression, suicidal ideas, hallucinations and memory loss. The patient is nervous/anxious (Per patient related to detoxing from alcohol) and has insomnia (related to detoxing from alcohol per patient report).   All other systems reviewed and are negative.   Blood pressure 139/72, pulse 87, temperature 98.5 F (36.9 C), temperature source Oral, resp. rate 18, height 6' (1.829 m), weight 72.576 kg (160 lb), SpO2 100 %.Body mass index is 21.7 kg/(m^2).  General Appearance: Disheveled  Eye Contact::  Good  Speech:  Normal Rate  Volume:  Normal  Mood:  Okay  Affect:  Anxious and mildly irritable  Thought Process:  Linear and Logical  Orientation:  Full (Time, Place, and Person)  Thought Content:  Negative  Suicidal Thoughts:  No  Homicidal Thoughts:  No  Memory:  Immediate;   Good Recent;   Good Remote;   Good  Judgement:  Fair  Insight:  Fair  Psychomotor Activity:  Negative  Concentration:  Good  Recall:  Good  Fund of Knowledge:Good  Language: Good  Akathisia:  Negative  Handed:    AIMS (if indicated):     Assets:  Communication Skills Desire for Improvement Vocational/Educational  ADL's:  Intact   Cognition: WNL  Sleep:  Number of Hours: 7.45     Treatment Plan Summary: Daily contact with patient to assess and evaluate symptoms and progress in treatment, Medication management and Plan   Alcohol use disorder, severe-patient will be on CIWA protocol. We will also start him on some standing Ativan given his reports of problematic withdrawals in the past.   Depression-this appears related to his alcohol and thus we will monitor this or any signs symptoms of a separate depressive disorder  Diabetes-metformin 1000 oh grams twice a day  Neuropathy-Neurontin 600 mg 3 times a day  Insomnia- trazodone as needed  Dispo-ones through detox will assess for referral to residential program or intensive outpatient treatment.  Observation Level/Precautions:  Continuous Observation Detox  Laboratory:  Reviewed  Psychotherapy:    Medications:    Consultations:    Discharge Concerns:    Estimated LOS:  Other:     I certify that inpatient services furnished can reasonably be expected to improve the patient's condition.   Faith Rogue 10/26/201612:07 PM

## 2015-03-05 NOTE — BHH Counselor (Signed)
Received phone call from patient's Probation Officer Haywood Lasso [phone]), wanting to verify he was a patient at [facility]. Writer confirmed that their was a signed consent to release of information for the officer. Writer called him back and let him know he was at [facility].

## 2015-03-05 NOTE — BHH Group Notes (Signed)
[facility] Group Notes:  (Nursing/MHT/Case Management/Adjunct)  Date:  03/05/2015  Time:  2:14 PM  Type of Therapy:  Psychoeducational Skills  Participation Level:  Did Not Attend   Adela Lank [facility] 03/05/2015, 2:14 PM

## 2015-03-05 NOTE — BHH Group Notes (Signed)
[facility] LCSW Group Therapy  03/05/2015 2:58 PM  Type of Therapy:  Group Therapy  Participation Level:  Did Not Attend   Keene Breath, MSW, LCSWA 03/05/2015, 2:58 PM

## 2015-03-05 NOTE — Progress Notes (Signed)
D:  Patient assessed per CIWA protocol.  Anxiety and irritation only notable withdrawal symptoms.  Patient verbalizes "I need my ativan because I am withdrawing.  [facility] I was [facility] I got my ativan every 4 hours"  Explained to patient how ativan was ordered.  Patient stays in room in bed except med pass and meals.  A:  Medication given per orders.  Patient refused to have evening CBG done because ate before was able to get blood sugar.  Support and encouragement offered.  R:  Safety maintained.

## 2015-03-05 NOTE — BHH Group Notes (Signed)
[facility] LCSW Aftercare Discharge Planning Group Note   03/05/2015 11:38 AM  Patient did not attend.  Christa See, Clinical Social Work Intern 03/05/15  Carmell Austria, MSW,  Latanya Presser  03/05/15

## 2015-03-06 LAB — GLUCOSE, CAPILLARY
Glucose-Capillary: 419 mg/dL — ABNORMAL HIGH (ref 65–99)
Glucose-Capillary: 327 mg/dL — ABNORMAL HIGH (ref 65–99)
Glucose-Capillary: 278 mg/dL — ABNORMAL HIGH (ref 65–99)
Glucose-Capillary: 294 mg/dL — ABNORMAL HIGH (ref 65–99)
Glucose-Capillary: 246 mg/dL — ABNORMAL HIGH (ref 65–99)
Glucose-Capillary: 277 mg/dL — ABNORMAL HIGH (ref 65–99)

## 2015-03-06 MED ORDER — INSULIN ASPART 100 UNIT/ML ~~LOC~~ SOLN
2.0000 [IU] | Freq: Three times a day (TID) | SUBCUTANEOUS | Status: DC
  Administered 2015-03-06 – 2015-03-07 (×3): 6 [IU] via SUBCUTANEOUS
  Filled 2015-03-06 (×2): qty 6

## 2015-03-06 NOTE — Progress Notes (Signed)
CBG=327 @ 0200 am, feedback provided to Dr. Jimmye Norman, no new orders, will follow POC.

## 2015-03-06 NOTE — BHH Group Notes (Signed)
[place] [facility] Group Notes:  (Nursing/MHT/Case Management/Adjunct)  Date:  03/06/2015  Time:  2:22 PM  Type of Therapy:  Psychoeducational Skills  Participation Level:  Did Not Attend   Adela Lank [facility] 03/06/2015, 2:22 PM

## 2015-03-06 NOTE — Progress Notes (Signed)
Inpatient Diabetes Program Recommendations  AACE/ADA: New Consensus Statement on Inpatient Glycemic Control (2015)  Target Ranges:  Prepandial:   less than 140 mg/dL      Peak postprandial:   less than 180 mg/dL (1-2 hours)      Critically ill patients:  140 - 180 mg/dL  Results for [name], [name] (MRN [id number]) as of 03/06/2015 08:47  Ref. Range 03/05/2015 20:04 03/05/2015 21:50 03/06/2015 02:08 03/06/2015 04:05  Glucose-Capillary Latest Ref Range: 65-99 mg/dL 312 (H) 419 (H) 327 (H) 278 (H)   Review of Glycemic Control  Current orders for Inpatient glycemic control: Novolog 2-6 units Q4H, Metformin 1000 mg BID  Inpatient Diabetes Program Recommendations:  Insulin - Basal: Please consider ordering low dose basal insulin. Recommend starting on Levemir 10 units daily (based on 72 kg x 0.15 units). Correction (SSI): Patient is currently ordered Novolog 2-6 units Q4H (ICU Glycemic control order set). Please discontinue ICU Glycemic Control order set and use the Glycemic Control order set and order Novolog 0-15 TID with meals and Novolog 0-5 units HS.  Thanks, Barnie Alderman, RN, MSN, CCRN, CDE Diabetes Coordinator Inpatient Diabetes Program [phone] (Team Pager from [place] to [place]) [phone] ([place] office) [phone] [facility] office) [phone] [facility] office)

## 2015-03-06 NOTE — Progress Notes (Signed)
CBG=419 at [place], Dr. Weber Cooks did not return page, Dr. Jimmye Norman contacted, directed to "give 6 Units novoLOG, re-check CBG in an hour and call me back with the result'' This TO/RBO is part of ongoing SSI for clarification of ICU Hyperglycemia Protocol.

## 2015-03-06 NOTE — Progress Notes (Signed)
Patient stays to himself.  Looks disheveled in scrubs.  Gatorade given to assist in detox process.  Denies SI or depression.  No group attendance.

## 2015-03-06 NOTE — Progress Notes (Signed)
Initial Nutrition Assessment   INTERVENTION:   Meals and Snacks: Cater to patient preferences Medical Food Supplement Therapy: will recommend on follow if intake inadequate   NUTRITION DIAGNOSIS:   Inadequate oral intake related to poor appetite as evidenced by per patient/family report (per MST on admission).  GOAL:   Patient will meet greater than or equal to 90% of their needs  MONITOR:    (Energy Intake, Anthropometrics)  REASON FOR ASSESSMENT:   Malnutrition Screening Tool    ASSESSMENT:   Pt admitted with major depression and EtOH use disorder.  Past Medical History  Diagnosis Date  . Alcoholism /alcohol abuse ([place])   . Diabetes mellitus without complication ([place])   . Hepatitis C   . Neuropathy ([place])     Diet Order:  Diet Carb Modified Fluid consistency:: Thin; Room service appropriate?: Yes   Current Nutrition: Pt currently eating 86% of meals on average since admission.   Food/Nutrition-Related History: Per MST pt reports decreased appetite. RD notes pt admitting to consuming 5-64oz. Beers daily per MD note.   Scheduled Medications:  . folic acid  1 mg Oral Daily  . gabapentin  600 mg Oral TID  . insulin aspart  2-6 Units Subcutaneous 6 times per day  . LORazepam  1 mg Oral 4 times per day   Followed by  . [START ON 03/07/2015] LORazepam  1 mg Oral BID  . metFORMIN  1,000 mg Oral BID WC  . multivitamin with minerals  1 tablet Oral Daily  . thiamine  100 mg Oral Daily   Or  . thiamine  100 mg Intravenous Daily    Electrolyte/Renal Profile and Glucose Profile:   Recent Labs Lab 03/02/15 2245 03/04/15 1412  NA 129* 129*  K 4.5 4.7  CL 91* 92*  CO2 30 29  BUN 10 13  CREATININE 0.70 0.62  CALCIUM 8.6* 9.2  GLUCOSE 301* 369*   Protein Profile:  Recent Labs Lab 03/02/15 2245 03/04/15 1412  ALBUMIN 3.8 3.2*    Weight Change: Per MST decrease in weight PTA, no further documentation of weight trend.   Height:   Ht Readings from  Last 1 Encounters:  03/04/15 6' (1.829 m)    Weight:   Wt Readings from Last 1 Encounters:  03/04/15 160 lb (72.576 kg)    BMI:  Body mass index is 21.7 kg/(m^2).   LOW Care Level  Dwyane Luo, [place], [place] Pager [phone]

## 2015-03-06 NOTE — Progress Notes (Signed)
Recreation Therapy Notes  Date: 10.27.16 Time: 3:15 pm Location: Craft Room  Group Topic: Leisure Education  Goal Area(s) Addresses:  Patient will identify activities for each letter of the alphabet. Patient will verbalize ability to integrate positive leisure into life post d/c. 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  Behavioral Response: Did not attend   Intervention: Leisure Alphabet  Activity: Patients were given a Leisure Alphabet worksheet and instructed to think of healthy leisure activities for each letter of the alphabet.  Education: LRT educated patients on what they need to participate in leisure.  Education Outcome: Patient did not attend group.   Clinical Observations/Feedback: Patient did not attend group.  Leonette Monarch, LRT/CTRS 03/06/2015 4:23 PM

## 2015-03-06 NOTE — Progress Notes (Signed)
[facility] MD Progress Note  03/06/2015 3:48 PM [name]  MRN:  [id number] Subjective: Patient denied any physical complaints today. I did try to discuss with him his blood sugar issues. Patient is adamant that typically when he withdraws his blood sugars are high. I discussed with him perhaps getting a medicine consult for diabetes management. Patient declines that he wants this intervention. He states he's typically been fine on his metformin. He is most interested in making sure his probation officer is notified about his inpatient status.'s cussed with him if he wanted referral to a residential program and he declines this stating that he needs to work. Principal Problem: <principal problem not specified> Diagnosis:   Patient Active Problem List   Diagnosis Date Noted  . Depression [F32.9] 03/04/2015  . Alcohol dependence with uncomplicated withdrawal ([place]) [F10.230] 03/03/2015  . Substance induced mood disorder ([place]) [F19.94] 03/03/2015  . Alcohol abuse [F10.10]   . Suicidal ideation [R45.851]    Total Time spent with patient: 15 minutes  Past Psychiatric History:   Past Medical History:  Past Medical History  Diagnosis Date  . Alcoholism /alcohol abuse ([place])   . Diabetes mellitus without complication ([place])   . Hepatitis C   . Neuropathy ([place])    History reviewed. No pertinent past surgical history. Family History: History reviewed. No pertinent family history. Family Psychiatric  History:  Social History:  History  Alcohol Use  . Yes     History  Drug Use No    Social History   Social History  . Marital Status: Divorced    Spouse Name: N/A  . Number of Children: N/A  . Years of Education: N/A   Social History Main Topics  . Smoking status: Current Every Day Smoker -- 1.00 packs/day    Types: Cigarettes  . Smokeless tobacco: None  . Alcohol Use: Yes  . Drug Use: No  . Sexual Activity: Not Asked   Other Topics Concern  . None   Social History Narrative    Additional Social History:                         Sleep: Good  Appetite:  Good  Current Medications: Current Facility-Administered Medications  Medication Dose Route Frequency Provider Last Rate Last Dose  . acetaminophen (TYLENOL) tablet 650 mg  650 mg Oral Q6H PRN Marjie Skiff, MD      . alum & mag hydroxide-simeth (MAALOX/MYLANTA) 200-200-20 MG/5ML suspension 30 mL  30 mL Oral Q4H PRN Marjie Skiff, MD      . folic acid (FOLVITE) tablet 1 mg  1 mg Oral Daily Marjie Skiff, MD   1 mg at 03/06/15 0825  . gabapentin (NEURONTIN) tablet 600 mg  600 mg Oral TID Marjie Skiff, MD   600 mg at 03/06/15 0825  . insulin aspart (novoLOG) injection 2-6 Units  2-6 Units Subcutaneous TID WC Marjie Skiff, MD      . LORazepam (ATIVAN) tablet 1 mg  1 mg Oral Q6H PRN Marjie Skiff, MD   1 mg at 03/05/15 0946   Or  . LORazepam (ATIVAN) injection 1 mg  1 mg Intravenous Q6H PRN Marjie Skiff, MD      . LORazepam (ATIVAN) tablet 1 mg  1 mg Oral 4 times per day Marjie Skiff, MD   1 mg at 03/06/15 1209   Followed by  . [START ON 03/07/2015] LORazepam (ATIVAN) tablet 1 mg  1 mg Oral BID Marjie Skiff, MD      . magnesium hydroxide (MILK OF MAGNESIA) suspension 30 mL  30 mL Oral Daily PRN Marjie Skiff, MD      . metFORMIN (GLUCOPHAGE) tablet 1,000 mg  1,000 mg Oral BID WC Marjie Skiff, MD   1,000 mg at 03/06/15 0825  . multivitamin with minerals tablet 1 tablet  1 tablet Oral Daily Marjie Skiff, MD   1 tablet at 03/06/15 0825  . thiamine (VITAMIN B-1) tablet 100 mg  100 mg Oral Daily Marjie Skiff, MD   100 mg at 03/06/15 0825   Or  . thiamine (B-1) injection 100 mg  100 mg Intravenous Daily Marjie Skiff, MD      . traZODone (DESYREL) tablet 50 mg  50 mg Oral QHS PRN Marjie Skiff, MD        Lab Results:  Results for orders placed or performed during the hospital encounter of 03/04/15 (from the past 48 hour(s))  Glucose, capillary     Status:  Abnormal   Collection Time: 03/05/15  8:04 PM  Result Value Ref Range   Glucose-Capillary 312 (H) 65 - 99 mg/dL   Comment 1 Notify RN   Glucose, capillary     Status: Abnormal   Collection Time: 03/05/15  9:50 PM  Result Value Ref Range   Glucose-Capillary 419 (H) 65 - 99 mg/dL   Comment 1 Notify RN   Glucose, capillary     Status: Abnormal   Collection Time: 03/06/15  2:08 AM  Result Value Ref Range   Glucose-Capillary 327 (H) 65 - 99 mg/dL   Comment 1 Notify RN   Glucose, capillary     Status: Abnormal   Collection Time: 03/06/15  4:05 AM  Result Value Ref Range   Glucose-Capillary 278 (H) 65 - 99 mg/dL   Comment 1 Notify RN   Glucose, capillary     Status: Abnormal   Collection Time: 03/06/15 12:06 PM  Result Value Ref Range   Glucose-Capillary 294 (H) 65 - 99 mg/dL    Physical Findings: AIMS:  , ,  ,  ,    CIWA:  CIWA-Ar Total: 4 COWS:     Musculoskeletal: Strength & Muscle Tone: within normal limits Gait & Station: normal Patient leans: N/A  Psychiatric Specialty Exam: Review of Systems  Psychiatric/Behavioral: Positive for substance abuse. Negative for depression, suicidal ideas, hallucinations and memory loss. The patient is not nervous/anxious and does not have insomnia.   All other systems reviewed and are negative.   Blood pressure 124/76, pulse 93, temperature 98.1 F (36.7 C), temperature source Oral, resp. rate 20, height 6' (1.829 m), weight 72.576 kg (160 lb), SpO2 98 %.Body mass index is 21.7 kg/(m^2).  General Appearance: Fairly Groomed  Engineer, water::  Good  Speech:  Normal Rate  Volume:  Normal  Mood:  Good  Affect:  Constricted  Thought Process:  Linear  Orientation:  Full (Time, Place, and Person)  Thought Content:  Negative  Suicidal Thoughts:  No  Homicidal Thoughts:  No  Memory:  Immediate;   Good Recent;   Good Remote;   Good  Judgement:  Fair  Insight:  Fair  Psychomotor Activity:  Negative  Concentration:  Good  Recall:  Good   Fund of Knowledge:Fair  Language: Good  Akathisia:  Negative  Handed:  Right  AIMS (if indicated):     Assets:  Vocational/Educational  ADL's:  Intact  Cognition: WNL  Sleep:  Number of Hours: 5.15   Treatment Plan Summary:  Daily contact with patient to assess and evaluate symptoms and progress in treatment, Medication management and Plan   Alcohol use disorder, severe-patient will be on CIWA protocol. We will also start him on some standing Ativan given his reports of problematic withdrawals in the past.   Depression-this appears related to his alcohol and thus we will monitor this or any signs symptoms of a separate depressive disorder  Diabetes-metformin 1000 mg  twice a day, he is on a sliding scale. However on the reduced the frequency of his blood sugar checks to 3 times a day with meals. Patient does not want any medication consult with medical.  Neuropathy-Neurontin 600 mg 3 times a day  Insomnia- trazodone as needed  Dispo-ones through detox will assess for referral to residential program or intensive outpatient treatment. Faith Rogue 03/06/2015, 3:48 PM

## 2015-03-06 NOTE — Plan of Care (Signed)
Problem: Alteration in mood Goal: LTG-Patient reports reduction in suicidal thoughts (Patient reports reduction in suicidal thoughts and is able to verbalize a safety plan for whenever patient is feeling suicidal)  Outcome: Progressing Denies SI

## 2015-03-07 LAB — GLUCOSE, CAPILLARY
GLUCOSE-CAPILLARY: 339 mg/dL — AB (ref 65–99)
GLUCOSE-CAPILLARY: 239 mg/dL — AB (ref 65–99)
Glucose-Capillary: 290 mg/dL — ABNORMAL HIGH (ref 65–99)
Glucose-Capillary: 325 mg/dL — ABNORMAL HIGH (ref 65–99)

## 2015-03-07 MED ORDER — INSULIN ASPART 100 UNIT/ML ~~LOC~~ SOLN
0.0000 [IU] | Freq: Three times a day (TID) | SUBCUTANEOUS | Status: DC
  Administered 2015-03-07: 7 [IU] via SUBCUTANEOUS
  Administered 2015-03-08: 5 [IU] via SUBCUTANEOUS
  Administered 2015-03-08: 9 [IU] via SUBCUTANEOUS
  Administered 2015-03-08: 5 [IU] via SUBCUTANEOUS
  Administered 2015-03-09: 7 [IU] via SUBCUTANEOUS
  Administered 2015-03-09: 5 [IU] via SUBCUTANEOUS
  Administered 2015-03-09: 9 [IU] via SUBCUTANEOUS
  Administered 2015-03-10: 3 [IU] via SUBCUTANEOUS
  Administered 2015-03-10: 9 [IU] via SUBCUTANEOUS
  Filled 2015-03-07 (×2): qty 1

## 2015-03-07 MED ORDER — HYDROXYZINE HCL 50 MG PO TABS
50.0000 mg | ORAL_TABLET | Freq: Once | ORAL | Status: AC
  Administered 2015-03-07: 50 mg via ORAL

## 2015-03-07 NOTE — BHH Group Notes (Signed)
[facility] Group Notes:  (Nursing/MHT/Case Management/Adjunct)  Date:  03/07/2015  Time:  10:39 PM  Type of Therapy:  Evening Wrap-up Group  Participation Level:  Active  Participation Quality:  Appropriate  Affect:  Appropriate  Cognitive:  Alert  Insight:  Appropriate  Engagement in Group:  Developing/Improving  Modes of Intervention:  Activity  Summary of Progress/Problems:  Levonne Spiller 03/07/2015, 10:39 PM

## 2015-03-07 NOTE — Progress Notes (Signed)
[facility] MD Progress Note  03/07/2015 2:34 PM [name]  MRN:  [id number] Subjective: Patient denied any physical complaints today. Patient indicates he feels it is taking him longer to detox from alcohol this time. He does state his plan after [name] be to go to an [name]. He has been telling nurses that he needs more Ativan than he's been getting. He is not showing any signs of withdrawal. He is largely not participating with any programming. Principal Problem: <principal problem not specified> Diagnosis:   Patient Active Problem List   Diagnosis Date Noted  . Depression [F32.9] 03/04/2015  . Alcohol dependence with uncomplicated withdrawal ([place]) [F10.230] 03/03/2015  . Substance induced mood disorder ([place]) [F19.94] 03/03/2015  . Alcohol abuse [F10.10]   . Suicidal ideation [R45.851]    Total Time spent with patient: 15 minutes  Past Psychiatric History:   Past Medical History:  Past Medical History  Diagnosis Date  . Alcoholism /alcohol abuse ([place])   . Diabetes mellitus without complication ([place])   . Hepatitis C   . Neuropathy ([place])    History reviewed. No pertinent past surgical history. Family History: History reviewed. No pertinent family history. Family Psychiatric  History:  Social History:  History  Alcohol Use  . Yes     History  Drug Use No    Social History   Social History  . Marital Status: Divorced    Spouse Name: N/A  . Number of Children: N/A  . Years of Education: N/A   Social History [name] Topics  . Smoking status: Current Every Day Smoker -- 1.00 packs/day    Types: Cigarettes  . Smokeless tobacco: None  . Alcohol Use: Yes  . Drug Use: No  . Sexual Activity: Not Asked   Other Topics Concern  . None   Social History Narrative   Additional Social History:                         Sleep: Good  Appetite:  Good  Current Medications: Current Facility-Administered Medications  Medication Dose Route Frequency Provider Last  Rate Last Dose  . acetaminophen (TYLENOL) tablet 650 mg  650 mg Oral Q6H PRN Marjie Skiff, MD      . alum & mag hydroxide-simeth (MAALOX/MYLANTA) 200-200-20 MG/5ML suspension 30 mL  30 mL Oral Q4H PRN Marjie Skiff, MD      . folic acid (FOLVITE) tablet 1 mg  1 mg Oral Daily Marjie Skiff, MD   1 mg at 03/07/15 0820  . gabapentin (NEURONTIN) tablet 600 mg  600 mg Oral TID Marjie Skiff, MD   600 mg at 03/07/15 0820  . insulin aspart (novoLOG) injection 0-9 Units  0-9 Units Subcutaneous TID WC Marjie Skiff, MD      . LORazepam (ATIVAN) tablet 1 mg  1 mg Oral Q6H PRN Marjie Skiff, MD   1 mg at 03/06/15 2202   Or  . LORazepam (ATIVAN) injection 1 mg  1 mg Intravenous Q6H PRN Marjie Skiff, MD      . LORazepam (ATIVAN) tablet 1 mg  1 mg Oral BID Marjie Skiff, MD   1 mg at 03/07/15 1209  . magnesium hydroxide (MILK OF MAGNESIA) suspension 30 mL  30 mL Oral Daily PRN Marjie Skiff, MD      . metFORMIN (GLUCOPHAGE) tablet 1,000 mg  1,000 mg Oral BID WC Marjie Skiff, MD   1,000 mg at 03/07/15 0819  .  multivitamin with minerals tablet 1 tablet  1 tablet Oral Daily Marjie Skiff, MD   1 tablet at 03/07/15 [phone]  . thiamine (VITAMIN B-1) tablet 100 mg  100 mg Oral Daily Marjie Skiff, MD   100 mg at 03/07/15 4696   Or  . thiamine (B-1) injection 100 mg  100 mg Intravenous Daily Marjie Skiff, MD      . traZODone (DESYREL) tablet 50 mg  50 mg Oral QHS PRN Marjie Skiff, MD        Lab Results:  Results for orders placed or performed during the hospital encounter of 03/04/15 (from the past 48 hour(s))  Glucose, capillary     Status: Abnormal   Collection Time: 03/05/15  8:04 PM  Result Value Ref Range   Glucose-Capillary 312 (H) 65 - 99 mg/dL   Comment 1 Notify RN   Glucose, capillary     Status: Abnormal   Collection Time: 03/05/15  9:50 PM  Result Value Ref Range   Glucose-Capillary 419 (H) 65 - 99 mg/dL   Comment 1 Notify RN   Glucose, capillary      Status: Abnormal   Collection Time: 03/06/15  2:08 AM  Result Value Ref Range   Glucose-Capillary 327 (H) 65 - 99 mg/dL   Comment 1 Notify RN   Glucose, capillary     Status: Abnormal   Collection Time: 03/06/15  4:05 AM  Result Value Ref Range   Glucose-Capillary 278 (H) 65 - 99 mg/dL   Comment 1 Notify RN   Glucose, capillary     Status: Abnormal   Collection Time: 03/06/15 12:06 PM  Result Value Ref Range   Glucose-Capillary 294 (H) 65 - 99 mg/dL  Glucose, capillary     Status: Abnormal   Collection Time: 03/06/15  4:10 PM  Result Value Ref Range   Glucose-Capillary 246 (H) 65 - 99 mg/dL   Comment 1 Notify RN   Glucose, capillary     Status: Abnormal   Collection Time: 03/06/15  8:34 PM  Result Value Ref Range   Glucose-Capillary 277 (H) 65 - 99 mg/dL  Glucose, capillary     Status: Abnormal   Collection Time: 03/07/15  7:03 AM  Result Value Ref Range   Glucose-Capillary 290 (H) 65 - 99 mg/dL  Glucose, capillary     Status: Abnormal   Collection Time: 03/07/15 12:06 PM  Result Value Ref Range   Glucose-Capillary 325 (H) 65 - 99 mg/dL   Comment 1 Document in Chart     Physical Findings: AIMS:  , ,  ,  ,    CIWA:  CIWA-Ar Total: 0 COWS:     Musculoskeletal: Strength & Muscle Tone: within normal limits Gait & Station: normal Patient leans: N/A  Psychiatric Specialty Exam: Review of Systems  Psychiatric/Behavioral: Positive for substance abuse. Negative for depression, suicidal ideas, hallucinations and memory loss. The patient is not nervous/anxious and does not have insomnia.   All other systems reviewed and are negative.   Blood pressure 138/79, pulse 74, temperature 98.1 F (36.7 C), temperature source Oral, resp. rate 20, height 6' (1.829 m), weight 72.576 kg (160 lb), SpO2 98 %.Body mass index is 21.7 kg/(m^2).  General Appearance: Fairly Groomed  Engineer, water::  Good  Speech:  Normal Rate  Volume:  Normal  Mood:  Good  Affect:  Constricted  Thought  Process:  Linear  Orientation:  Full (Time, Place, and Person)  Thought Content:  Negative  Suicidal Thoughts:  No  Homicidal Thoughts:  No  Memory:  Immediate;   Good Recent;   Good Remote;   Good  Judgement:  Fair  Insight:  Fair  Psychomotor Activity:  Negative  Concentration:  Good  Recall:  Good  Fund of Knowledge:Fair  Language: Good  Akathisia:  Negative  Handed:  Right  AIMS (if indicated):     Assets:  Vocational/Educational  ADL's:  Intact  Cognition: WNL  Sleep:  Number of Hours: 6.15   Treatment Plan Summary:  Daily contact with patient to assess and evaluate symptoms and progress in treatment, Medication management and Plan   Alcohol use disorder, severe-patient will be on CIWA protocol. We will also start him on some standing Ativan given his reports of problematic withdrawals in the past.   Depression-this appears related to his alcohol and thus we will monitor this or any signs symptoms of a separate depressive disorder  Diabetes-metformin 1000 mg  twice a day, he has required a sliding scale. His blood sugars do continue to run in the 200s and 300s. We have rewritten his sliding scale to have a higher range of as needed insulin. . Patient does not want any medication consult with medical for changes to his metformin.  Neuropathy-Neurontin 600 mg 3 times a day  Insomnia- trazodone as needed  Dispo-ones through detox will assess for referral to residential program or intensive outpatient treatment. Faith Rogue 03/07/2015, 2:34 PM

## 2015-03-07 NOTE — Tx Team (Signed)
Interdisciplinary Treatment Plan Update (Adult)  Date:  03/07/2015 Time Reviewed:  4:10 PM  Progress in Treatment: Attending groups: No. Participating in groups:  No. Taking medication as prescribed:  Yes. Tolerating medication:  Yes. Family/Significant othe contact made:  No, will contact:  if patient provides consent Patient understands diagnosis:  Yes. Discussing patient identified problems/goals with staff:  Yes. Medical problems stabilized or resolved:  Yes. Denies suicidal/homicidal ideation: Yes. Issues/concerns per patient self-inventory:  Yes. Patient is currently homeless Other:  New problem(s) identified: No, Describe:  none reported  Discharge Plan or Barriers: Patient will stabilize on medications and will discharge. Patient is currently homeless and is given [place] list as requested and informed of [facility] who can assist patient in shelter availability in [place].   Reason for Continuation of Hospitalization: Depression Suicidal ideation  Comments:  Estimated length of stay: up to 3 days expected discharge Monday 03/10/15  New goal(s):  Review of initial/current patient goals per problem list:   1.  Goal(s):participate in care plan  Met:  Yes  2.  Goal (s):decrease depression  Met:  No  Target date: 03/10/15  3.  Goal(s):eliminate SI  Met:  Yes   Attendees: Physician:  Faith Rogue, MD 10/28/20164:10 PM  Nursing:   Elige Radon, RN 10/28/20164:10 PM  Other:  Carmell Austria, [place] 10/28/20164:10 PM  Other:  Everitt Amber, [place] 10/28/20164:10 PM  Other:   10/28/20164:10 PM  Other:  10/28/20164:10 PM  Other:  10/28/20164:10 PM  Other:  10/28/20164:10 PM  Other:  10/28/20164:10 PM  Other:  10/28/20164:10 PM  Other:  10/28/20164:10 PM  Other:   10/28/20164:10 PM   Scribe for Treatment Team:   Keene Breath, MSW, LCSWA  03/07/2015, 4:10 PM

## 2015-03-07 NOTE — Progress Notes (Signed)
Recreation Therapy Notes  INPATIENT RECREATION THERAPY ASSESSMENT  Patient Details Name: [name] MRN: [id number] DOB: 26-Aug-1955 Today's Date: 03/07/2015  Patient Stressors: Work  Coping Skills:   Isolate, Substance Abuse, Avoidance, Exercise, Art/Dance, Talking, Music, Sports  Personal Challenges: Anger, Communication, Concentration, Decision-Making, Expressing Yourself, Problem-Solving, Relationships, Substance Abuse, Trusting Others  Leisure Interests (2+):  Individual - Other (Comment) (Be with women, play music)  Awareness of Community Resources:  Yes  Community Resources:  [employer], [place]  Current Use: Yes  If no, Barriers?:    Patient Strengths:  Systems analyst, completed in learning, outgoing  Patient Identified Areas of Improvement:  Everything  Current Recreation Participation:  Playing music  Patient Goal for Hospitalization:  To detox from alcohol and get back to not drinking to resume life  [place] of Residence:  [place] of Residence:  [place]   Current [place] (including self-harm):  No  Current [place]:  No  Consent to Intern Participation: N/A   Leonette Monarch, LRT/CTRS 03/07/2015, 2:49 PM

## 2015-03-07 NOTE — Progress Notes (Signed)
Results for [name], [name] (MRN [id number]) as of 03/07/2015 00:21  Ref. Range 03/04/2015 14:12  Sodium Latest Ref Range: 135-145 mmol/L 129 (L)   Patient continues to drink Gatorades to increase Sodium Level, verbalized understanding that drinking water will dilute and make Na level even lower. Dehydrated still, denied SI/HI/SIB, denied AVH. Will recommend Chem Profile to determine if hyponatremia perisists.

## 2015-03-07 NOTE — Progress Notes (Signed)
Patient denies SI and depression.  At every medication pass requests ativan.  States "1 mg of ativan is not enough to detox"  No outward signs of withdrawal symptoms noted.  Patient does not participate in any group activities. Only out of room for meals and med pass.  Has a tendency to be  Argumentative and state how things are done at the other places he has been.

## 2015-03-07 NOTE — Progress Notes (Signed)
Recreation Therapy Notes  Date: 10.28.16 Time: 3:00 pm Location: Craft Room  Group Topic: Communication, Problem Solving, Teamwork  Goal Area(s) Addresses:  Patient will effectively work with peers towards shared goal. Patient will identify skills used to make activity successful. Patient will identify benefit of using group skills effectively post d/c.  Behavioral Response: Did not attend  Intervention: [employer]  Activity: Patients were given 15 pipe cleanser and instructed to build the tallest free standing tower. Patients were given 2 minutes to strategize. After approximately 5 minutes of building, patients were instructed to put their dominant hand behind their back. After approximately 3 minutes of building, patients were instructed to stop talking to each other.    Education: LRT educated patients on how communication, problem solving, and teamwork goes in to building a healthy support system.  Education Outcome: Patient did not attend group.  Clinical Observations/Feedback: Patient did not attend group.  Leonette Monarch, LRT/CTRS 03/07/2015 4:18 PM

## 2015-03-07 NOTE — BHH Counselor (Signed)
Adult Comprehensive Assessment  Patient ID: [name], male   DOB: 1955-11-26, 59 y.o.   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[employer], broadcasting Has patient ever been in the [employer]?: No Has patient ever served in Recruitment consultant?: No  Financial Resources:   Museum/gallery curator resources: Income from employment Does patient have a representative payee or guardian?: No  Alcohol/Substance Abuse:   What has been your use of drugs/alcohol within the last 12 months?: Alcohol use If attempted suicide, did drugs/alcohol play a role in this?: No Alcohol/Substance Abuse Treatment Hx: Past Tx, Outpatient, Past Tx, Inpatient  Social Support System:   Patient's Community Support System: Poor Describe Community Support System: Engineer, 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  Leisure/Recreation:      Strengths/Needs:      Discharge Plan:   Does patient have access to transportation?: No Plan for no access to transportation at discharge: will need support either bus or cab Will patient be returning to same living situation after discharge?: No Currently receiving community mental health services: Yes (From Whom) Nurse, children's) Does patient have financial barriers related to discharge medications?: No  Summary/Recommendations:  Patient is a divorced 59 yo wm admitted for SI and reports he lost his [employer] due to conflict with residents and reports he has not been drinking. Patient reports sobriety for 3 months and  follows up at [facility] and is now Homeless. Patient is given a list of [place] to coordinate housing. Patient wants to discharge Saturday but will need shelter arrangements made. Patient was informed that [facility] in [place] could arrange shelter for [name]. Patient will stabilize on medication and discharge but will need housing at discharge. Patient is encouraged to participate in group therapy, medication management, and therapeutic milieu.    Keene Breath., MSW, Latanya Presser  03/07/2015

## 2015-03-07 NOTE — BHH Group Notes (Signed)
[facility] LCSW Aftercare Discharge Planning Group Note   03/07/2015 3:25 PM  Participation Quality:  Did not attend.   Mariano Colon MSW, LCSWA

## 2015-03-07 NOTE — Plan of Care (Signed)
Problem: Ineffective individual coping Goal: STG: Patient will remain free from self harm Outcome: Progressing Medications administered as ordered by the physician, medications Therapeutic Effects, SEs and Adverse effects discussed, questions encouraged; no PRN given, 15 minute checks maintained for safety, clinical and moral support provided, patient encouraged to continue to express feelings and demonstrate safe care. Patient remain free from harm, will continue to monitor.

## 2015-03-07 NOTE — Progress Notes (Signed)
Inpatient Diabetes Program Recommendations  AACE/ADA: New Consensus Statement on Inpatient Glycemic Control (2015)  Target Ranges:  Prepandial:   less than 140 mg/dL      Peak postprandial:   less than 180 mg/dL (1-2 hours)      Critically ill patients:  140 - 180 mg/dL  Results for [place], [name] (MRN [id number]) as of 03/07/2015 08:51  Ref. Range 03/06/2015 02:08 03/06/2015 04:05 03/06/2015 12:06 03/06/2015 16:10 03/06/2015 20:34 03/07/2015 07:03  Glucose-Capillary Latest Ref Range: 65-99 mg/dL 327 (H) 278 (H) 294 (H) 246 (H) 277 (H) 290 (H)   Review of Glycemic Control  Current orders for Inpatient glycemic control: Novolog 2-6 units Q4H, Metformin 1000 mg BID  Inpatient Diabetes Program Recommendations: Insulin - Basal: Please consider ordering low dose basal insulin. Recommend starting on Levemir 10 units daily (based on 72 kg x 0.15 units). Correction (SSI): Patient is currently ordered Novolog 2-6 units Q4H (ICU Glycemic control order set). Please discontinue ICU Glycemic Control order set and use the regular Glycemic Control order set and order Novolog 0-15 TID with meals and Novolog 0-5 units HS. Oral Agents: Noted in MD progress note for 10/27 that patient declined medical consult for diabetes management. Glucose ranged from 246-327 mg/dl over the past 30 hours and fasting glucose is 290 mg/dl this morning. If basal insulin is not going to be ordered, please consider adding an additional oral DM medication by ordering Amaryl 2 mg BID.  Thanks, Barnie Alderman, RN, MSN, CCRN, CDE Diabetes Coordinator Inpatient Diabetes Program [phone] (Team Pager from [place] to [place]) [phone] ([facility] office) [phone] [facility] office) [phone] [facility] office)

## 2015-03-07 NOTE — Plan of Care (Signed)
Problem: Alteration in mood & ability to function due to Goal: STG-Patient will attend groups Outcome: Not Met (add Reason) Patient does not attend any groups.  Stays in his room in bed except for meals and to receive medications.

## 2015-03-07 NOTE — BHH Group Notes (Signed)
[facility] LCSW Group Therapy  03/07/2015 3:55 PM  Type of Therapy:  Group Therapy  Participation Level:  Did Not Attend   Keene Breath, MSW, LCSWA 03/07/2015, 3:55 PM

## 2015-03-08 LAB — GLUCOSE, CAPILLARY
GLUCOSE-CAPILLARY: 285 mg/dL — AB (ref 65–99)
GLUCOSE-CAPILLARY: 287 mg/dL — AB (ref 65–99)
Glucose-Capillary: 377 mg/dL — ABNORMAL HIGH (ref 65–99)

## 2015-03-08 MED ORDER — LORAZEPAM 1 MG PO TABS
1.0000 mg | ORAL_TABLET | Freq: Once | ORAL | Status: AC
  Administered 2015-03-08: 1 mg via ORAL
  Filled 2015-03-08: qty 1

## 2015-03-08 MED ORDER — HYDROXYZINE HCL 50 MG PO TABS
50.0000 mg | ORAL_TABLET | Freq: Three times a day (TID) | ORAL | Status: DC | PRN
  Administered 2015-03-08 – 2015-03-10 (×6): 50 mg via ORAL
  Filled 2015-03-08 (×6): qty 1

## 2015-03-08 NOTE — BHH Group Notes (Signed)
[place] LCSW Group Therapy  03/08/2015 2:32 PM  Type of Therapy:  Group Therapy  Participation Level:  Did Not Attend  Modes of Intervention:  Discussion, Education, Socialization and Support  Summary of Progress/Problems: Pt will identify unhealthy thoughts and how they impact their emotions and behavior. Pt will be encouraged to discuss these thoughts, emotions and behaviors with the group.   [place] MSW, [place]  03/08/2015, 2:32 PM

## 2015-03-08 NOTE — Plan of Care (Signed)
Problem: Alteration in mood & ability to function due to Goal: LTG-Pt reports reduction in suicidal thoughts (Patient reports reduction in suicidal thoughts and is able to verbalize a safety plan for whenever patient is feeling suicidal)  Outcome: Progressing Denies current SI, contracts for safety.  Goal: LTG-Patient demonstrates decreased signs of withdrawal (Patient demonstrates decreased signs of withdrawal to the point the patient is safe to return home and continue treatment in an outpatient setting)  Outcome: Progressing No clinical signs of withdrawal noted. Patient anxious and craving relief.  Goal: LTG-Pt is able to verbalize triggers for his/her abuse (Patient is able to verbalize triggers for his/her abuse and strategies to maintain sobriety)  Outcome: Progressing Notes he drinks due to anxiety, and would smoke marijuana if it was legal. He cannot break the law due to being on probation. He notes marijuana is much more effective than alcohol. States he started drinking a long time ago due to " a situation " which he declined to discuss further.  Goal: STG-Patient will report withdrawal symptoms Outcome: Progressing Patient reports anxiety.  Goal: STG-Patient will comply with prescribed medication regimen (Patient will comply with prescribed medication regimen)  Outcome: Progressing Takes meds as prescribed.

## 2015-03-08 NOTE — Plan of Care (Signed)
Problem: Alteration in mood & ability to function due to Goal: LTG-Pt reports reduction in suicidal thoughts (Patient reports reduction in suicidal thoughts and is able to verbalize a safety plan for whenever patient is feeling suicidal)  Outcome: Progressing Pt reports no current SI.  Goal: LTG-Patient demonstrates decreased signs of withdrawal (Patient demonstrates decreased signs of withdrawal to the point the patient is safe to return home and continue treatment in an outpatient setting)  Outcome: Progressing Pt exhibits no physical signs of withdrawal with the exception of nasal congestion which he notes is a sign for him. He is more active in the milieu, more interactive with peers, and more pleasant / accepting of clinical staff. Goal: LTG-Pt is able to verbalize triggers for his/her abuse (Patient is able to verbalize triggers for his/her abuse and strategies to maintain sobriety)  Outcome: Progressing Patient states the core issue of his ETOH abuse is anxiety.  Goal: STG-Patient will report withdrawal symptoms Outcome: Progressing Noted his stuffy nose is a symptom of withdrawal for him.

## 2015-03-08 NOTE — Progress Notes (Signed)
Pt has been pleasant and cooperative. Pt's mood and affect has been depressed. Pt denies SI and A/V hallucinations.Pt has been active on the unit. Pt has been detoxing well. Some redirecting needed at times.

## 2015-03-08 NOTE — Progress Notes (Signed)
[facility] MD Progress Note  03/08/2015 4:52 PM [name]  MRN:  [id number] Subjective: Patient denied any physical complaints today. He asserts that he still feeling like he is having anxiety secondary to withdrawal. There've not been any physical signs of withdrawal from alcohol during his stay. He had his last dose of Ativan yesterday but requests another 2 doses today. I explained to him that I would give him 1 dose today and that would be his last dose. He stated that was fine but then he would like to have Vistaril afterwards. Principal Problem: <principal problem not specified> Diagnosis:   Patient Active Problem List   Diagnosis Date Noted  . Depression [F32.9] 03/04/2015  . Alcohol dependence with uncomplicated withdrawal ([place]) [F10.230] 03/03/2015  . Substance induced mood disorder ([place]) [F19.94] 03/03/2015  . Alcohol abuse [F10.10]   . Suicidal ideation [R45.851]    Total Time spent with patient: 15 minutes  Past Psychiatric History:   Past Medical History:  Past Medical History  Diagnosis Date  . Alcoholism /alcohol abuse ([place])   . Diabetes mellitus without complication ([place])   . Hepatitis C   . Neuropathy ([place])    History reviewed. No pertinent past surgical history. Family History: History reviewed. No pertinent family history. Family Psychiatric  History:  Social History:  History  Alcohol Use  . Yes     History  Drug Use No    Social History   Social History  . Marital Status: Divorced    Spouse Name: N/A  . Number of Children: N/A  . Years of Education: N/A   Social History Main Topics  . Smoking status: Current Every Day Smoker -- 1.00 packs/day    Types: Cigarettes  . Smokeless tobacco: None  . Alcohol Use: Yes  . Drug Use: No  . Sexual Activity: Not Asked   Other Topics Concern  . None   Social History Narrative   Additional Social History:                         Sleep: Good  Appetite:  Good  Current Medications: Current  Facility-Administered Medications  Medication Dose Route Frequency Provider Last Rate Last Dose  . acetaminophen (TYLENOL) tablet 650 mg  650 mg Oral Q6H PRN Marjie Skiff, MD      . alum & mag hydroxide-simeth (MAALOX/MYLANTA) 200-200-20 MG/5ML suspension 30 mL  30 mL Oral Q4H PRN Marjie Skiff, MD      . folic acid (FOLVITE) tablet 1 mg  1 mg Oral Daily Marjie Skiff, MD   1 mg at 03/08/15 0819  . gabapentin (NEURONTIN) tablet 600 mg  600 mg Oral TID Marjie Skiff, MD   600 mg at 03/08/15 0820  . hydrOXYzine (ATARAX/VISTARIL) tablet 50 mg  50 mg Oral TID PRN Marjie Skiff, MD      . insulin aspart (novoLOG) injection 0-9 Units  0-9 Units Subcutaneous TID WC Marjie Skiff, MD   5 Units at 03/08/15 1636  . magnesium hydroxide (MILK OF MAGNESIA) suspension 30 mL  30 mL Oral Daily PRN Marjie Skiff, MD      . metFORMIN (GLUCOPHAGE) tablet 1,000 mg  1,000 mg Oral BID WC Marjie Skiff, MD   1,000 mg at 03/08/15 1829  . multivitamin with minerals tablet 1 tablet  1 tablet Oral Daily Marjie Skiff, MD   1 tablet at 03/08/15 0820  . thiamine (VITAMIN B-1) tablet 100 mg  100 mg Oral Daily Marjie Skiff, MD   100 mg at 03/08/15 0820   Or  . thiamine (B-1) injection 100 mg  100 mg Intravenous Daily Marjie Skiff, MD      . traZODone (DESYREL) tablet 50 mg  50 mg Oral QHS PRN Marjie Skiff, MD   50 mg at 03/07/15 2213    Lab Results:  Results for orders placed or performed during the hospital encounter of 03/04/15 (from the past 48 hour(s))  Glucose, capillary     Status: Abnormal   Collection Time: 03/06/15  8:34 PM  Result Value Ref Range   Glucose-Capillary 277 (H) 65 - 99 mg/dL  Glucose, capillary     Status: Abnormal   Collection Time: 03/07/15  7:03 AM  Result Value Ref Range   Glucose-Capillary 290 (H) 65 - 99 mg/dL  Glucose, capillary     Status: Abnormal   Collection Time: 03/07/15 12:06 PM  Result Value Ref Range   Glucose-Capillary 325 (H) 65 - 99  mg/dL   Comment 1 Document in Chart   Glucose, capillary     Status: Abnormal   Collection Time: 03/07/15  4:48 PM  Result Value Ref Range   Glucose-Capillary 339 (H) 65 - 99 mg/dL   Comment 1 Notify RN   Glucose, capillary     Status: Abnormal   Collection Time: 03/07/15  8:53 PM  Result Value Ref Range   Glucose-Capillary 239 (H) 65 - 99 mg/dL  Glucose, capillary     Status: Abnormal   Collection Time: 03/08/15  6:32 AM  Result Value Ref Range   Glucose-Capillary 285 (H) 65 - 99 mg/dL  Glucose, capillary     Status: Abnormal   Collection Time: 03/08/15 11:57 AM  Result Value Ref Range   Glucose-Capillary 377 (H) 65 - 99 mg/dL   Comment 1 Notify RN   Glucose, capillary     Status: Abnormal   Collection Time: 03/08/15  4:33 PM  Result Value Ref Range   Glucose-Capillary 287 (H) 65 - 99 mg/dL    Physical Findings: AIMS:  , ,  ,  ,    CIWA:  CIWA-Ar Total: 6 COWS:     Musculoskeletal: Strength & Muscle Tone: within normal limits Gait & Station: normal Patient leans: N/A  Psychiatric Specialty Exam: Review of Systems  Psychiatric/Behavioral: Positive for substance abuse. Negative for depression, suicidal ideas, hallucinations and memory loss. The patient is not nervous/anxious and does not have insomnia.   All other systems reviewed and are negative.   Blood pressure 120/75, pulse 77, temperature 98.9 F (37.2 C), temperature source Oral, resp. rate 20, height 6' (1.829 m), weight 72.576 kg (160 lb), SpO2 98 %.Body mass index is 21.7 kg/(m^2).  General Appearance: Fairly Groomed  Engineer, water::  Good  Speech:  Normal Rate  Volume:  Normal  Mood:  Good  Affect:  Constricted  Thought Process:  Linear  Orientation:  Full (Time, Place, and Person)  Thought Content:  Negative  Suicidal Thoughts:  No  Homicidal Thoughts:  No  Memory:  Immediate;   Good Recent;   Good Remote;   Good  Judgement:  Fair  Insight:  Fair  Psychomotor Activity:  Negative  Concentration:   Good  Recall:  Good  Fund of Knowledge:Fair  Language: Good  Akathisia:  Negative  Handed:  Right  AIMS (if indicated):     Assets:  Vocational/Educational  ADL's:  Intact  Cognition: WNL  Sleep:  Number  of Hours: 6.5   Treatment Plan Summary:  Daily contact with patient to assess and evaluate symptoms and progress in treatment, Medication management and Plan   Alcohol use disorder, severe-patient will be on CIWA protocol. We will also start him on some standing Ativan given his reports of problematic withdrawals in the past.   Depression-this appears related to his alcohol and thus we will monitor this or any signs symptoms of a separate depressive disorder  Diabetes-metformin 1000 mg  twice a day, he has required a sliding scale. His blood sugars do continue to run in the 200s and 300s. We have rewritten his sliding scale to have a higher range of as needed insulin.  Patient does not want any medication consult with medical for changes to his metformin.  Neuropathy-Neurontin 600 mg 3 times a day  Insomnia- trazodone as needed  Dispo-ones through detox will assess for referral to residential program or intensive outpatient treatment. Faith Rogue 03/08/2015, 4:52 PM

## 2015-03-09 LAB — GLUCOSE, CAPILLARY
GLUCOSE-CAPILLARY: 420 mg/dL — AB (ref 65–99)
GLUCOSE-CAPILLARY: 313 mg/dL — AB (ref 65–99)
GLUCOSE-CAPILLARY: 359 mg/dL — AB (ref 65–99)
Glucose-Capillary: 273 mg/dL — ABNORMAL HIGH (ref 65–99)
Glucose-Capillary: 510 mg/dL — ABNORMAL HIGH (ref 65–99)

## 2015-03-09 MED ORDER — GABAPENTIN 300 MG PO CAPS
600.0000 mg | ORAL_CAPSULE | Freq: Three times a day (TID) | ORAL | Status: DC
  Administered 2015-03-09 – 2015-03-11 (×5): 600 mg via ORAL
  Filled 2015-03-09 (×4): qty 2

## 2015-03-09 MED ORDER — PRAZOSIN HCL 1 MG PO CAPS
1.0000 mg | ORAL_CAPSULE | Freq: Every day | ORAL | Status: DC
  Administered 2015-03-09 – 2015-03-10 (×2): 1 mg via ORAL
  Filled 2015-03-09 (×4): qty 1

## 2015-03-09 MED ORDER — INSULIN ASPART 100 UNIT/ML ~~LOC~~ SOLN
5.0000 [IU] | Freq: Once | SUBCUTANEOUS | Status: AC
  Administered 2015-03-09: 5 [IU] via SUBCUTANEOUS

## 2015-03-09 NOTE — BHH Group Notes (Signed)
[place] LCSW Group Therapy  03/09/2015 6:34 PM  Type of Therapy:  Group Therapy  Participation Level:  Did Not Attend  Modes of Intervention:  Discussion, Education, Socialization and Support  Summary of Progress/Problems: Todays topic: Grudges  Patients will be encouraged to discuss their thoughts, feelings, and behaviors as to why one holds on to grudges and reasons why people have grudges. Patients will process the impact of grudges on their daily lives and identify thoughts and feelings related to holding grudges. Patients will identify feelings and thoughts related to what life would look like without grudges.   [place] MSW, [place]  03/09/2015, 6:34 PM

## 2015-03-09 NOTE — Progress Notes (Signed)
Patient notes recurrent nightmares and notes he continues to wake up feeling like he has a hangover. He is concerned and has heard about Minipress from another patient. He wonders if that may work well for him. Patient notes he will mention the possibility to the doctor. He also notes the Atarax being effective in helping him sleep last night.
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
## 2015-03-09 NOTE — Progress Notes (Signed)
Pt has been pleasant and cooperative. Pt's mood and affect has been depressed. Pt denies SI and A/V hallucinations.Pt has been active on the unit. Pt has been detoxing well. Some redirecting needed at times.

## 2015-03-09 NOTE — BHH Group Notes (Signed)
[facility] Group Notes:  (Nursing/MHT/Case Management/Adjunct)  Date:  03/09/2015  Time:  9:58 AM  Type of Therapy:  Goal-Setting  Participation Level:  Did not attend   Cheryll Dessert 03/09/2015, 9:58 AM

## 2015-03-10 DIAGNOSIS — F321 Major depressive disorder, single episode, moderate: Secondary | ICD-10-CM

## 2015-03-10 LAB — BASIC METABOLIC PANEL
ANION GAP: 6 (ref 5–15)
BUN: 21 mg/dL — ABNORMAL HIGH (ref 6–20)
CALCIUM: 9.5 mg/dL (ref 8.9–10.3)
CO2: 29 mmol/L (ref 22–32)
CREATININE: 0.63 mg/dL (ref 0.61–1.24)
Chloride: 96 mmol/L — ABNORMAL LOW (ref 101–111)
Glucose, Bld: 360 mg/dL — ABNORMAL HIGH (ref 65–99)
Potassium: 4.7 mmol/L (ref 3.5–5.1)
SODIUM: 131 mmol/L — AB (ref 135–145)

## 2015-03-10 LAB — GLUCOSE, CAPILLARY
GLUCOSE-CAPILLARY: 259 mg/dL — AB (ref 65–99)
GLUCOSE-CAPILLARY: 345 mg/dL — AB (ref 65–99)
GLUCOSE-CAPILLARY: 349 mg/dL — AB (ref 65–99)
GLUCOSE-CAPILLARY: 384 mg/dL — AB (ref 65–99)
GLUCOSE-CAPILLARY: 408 mg/dL — AB (ref 65–99)
Glucose-Capillary: 220 mg/dL — ABNORMAL HIGH (ref 65–99)

## 2015-03-10 MED ORDER — GABAPENTIN 300 MG PO CAPS: 600.0000 mg | ORAL_CAPSULE | Freq: Three times a day (TID) | ORAL | Status: DC

## 2015-03-10 MED ORDER — INSULIN ASPART 100 UNIT/ML ~~LOC~~ SOLN
5.0000 [IU] | Freq: Once | SUBCUTANEOUS | Status: AC
  Administered 2015-03-10: 5 [IU] via SUBCUTANEOUS

## 2015-03-10 MED ORDER — GLIMEPIRIDE 2 MG PO TABS
4.0000 mg | ORAL_TABLET | Freq: Every day | ORAL | Status: DC
  Administered 2015-03-11: 4 mg via ORAL
  Filled 2015-03-10: qty 2

## 2015-03-10 MED ORDER — METFORMIN HCL 1000 MG PO TABS: 1000.0000 mg | ORAL_TABLET | Freq: Two times a day (BID) | ORAL | Status: DC

## 2015-03-10 MED ORDER — INSULIN GLARGINE 100 UNIT/ML ~~LOC~~ SOLN
10.0000 [IU] | Freq: Every day | SUBCUTANEOUS | Status: DC
  Filled 2015-03-10: qty 0.1

## 2015-03-10 MED ORDER — LIVING WELL WITH DIABETES BOOK
Freq: Once | Status: AC
  Administered 2015-03-10: 16:00:00
  Filled 2015-03-10: qty 1

## 2015-03-10 MED ORDER — INSULIN ASPART 100 UNIT/ML ~~LOC~~ SOLN: 0.0000 [IU] | Freq: Three times a day (TID) | SUBCUTANEOUS | Status: DC

## 2015-03-10 MED ORDER — GLIMEPIRIDE 4 MG PO TABS: 4.0000 mg | ORAL_TABLET | Freq: Every day | ORAL | Status: DC

## 2015-03-10 MED ORDER — MIRTAZAPINE 15 MG PO TABS
15.0000 mg | ORAL_TABLET | Freq: Every day | ORAL | Status: DC
Start: 2015-03-10 — End: 2015-03-11
  Administered 2015-03-10: 15 mg via ORAL
  Filled 2015-03-10: qty 1

## 2015-03-10 MED ORDER — INSULIN ASPART 100 UNIT/ML ~~LOC~~ SOLN
0.0000 [IU] | Freq: Every day | SUBCUTANEOUS | Status: DC
  Administered 2015-03-10: 3 [IU] via SUBCUTANEOUS

## 2015-03-10 MED ORDER — INSULIN ASPART 100 UNIT/ML ~~LOC~~ SOLN
0.0000 [IU] | Freq: Three times a day (TID) | SUBCUTANEOUS | Status: DC
  Administered 2015-03-10: 9 [IU] via SUBCUTANEOUS
  Administered 2015-03-11: 8 [IU] via SUBCUTANEOUS
  Filled 2015-03-10: qty 3
  Filled 2015-03-10: qty 8
  Filled 2015-03-10: qty 9

## 2015-03-10 MED ORDER — HYDROXYZINE HCL 50 MG PO TABS
50.0000 mg | ORAL_TABLET | Freq: Three times a day (TID) | ORAL | Status: DC
  Administered 2015-03-10 – 2015-03-11 (×3): 50 mg via ORAL
  Filled 2015-03-10 (×3): qty 1

## 2015-03-10 MED ORDER — MIRTAZAPINE 15 MG PO TABS: 15.0000 mg | ORAL_TABLET | Freq: Every day | ORAL | Status: DC

## 2015-03-10 MED ORDER — PRAZOSIN HCL 1 MG PO CAPS: 1.0000 mg | ORAL_CAPSULE | Freq: Every day | ORAL | Status: DC

## 2015-03-10 NOTE — Plan of Care (Signed)
Problem: Alteration in mood Goal: STG-Patient is able to discuss feelings and issues (Patient is able to discuss feelings and issues leading to depression)  Outcome: Not Progressing Emotionally guarded.

## 2015-03-10 NOTE — Progress Notes (Signed)
D: Patient denies s/s of ETOH withdrawal. Mood labile but improving. He is focused on his diabetes management and ensuring that staff gives him insulin before he eats meals, however, he admits that he doesn't pay attention to his carbohydrate intake and frequently states how he wants to be on a basal insulin, even though he can't afford it. Rates depression and anxiety both 5/10. Denies SI. Attending groups. A: Given meds; monitored CBGs; checked q 15 minutes. R: Somatic focus. Superficial.

## 2015-03-10 NOTE — Progress Notes (Signed)
Recreation Therapy Notes  Date: 10.31.16 Time: 3:00 pm Location: Craft Room  Group Topic: Wellness  Goal Area(s) Addresses:  Patient will identify at least one item per dimension of health. Patient will examine areas they are deficient in.  Behavioral Response: Did not attend  Intervention: 6 Dimensions of Health  Activity: Patients were given a worksheet with the definitions of the 6 dimensions of health. Patients were given a second worksheet with the 6 dimensions of health on it and instructed to write out at least one item they are currently doing in each dimension.  Education: LRT educated patients on way they can improve each dimension.  Education Outcome: Patient did not attend group.  Clinical Observations/Feedback: Patient did not attend group.  [facility]ette [facility], LRT/CTRS 03/10/2015 3:59 PM

## 2015-03-10 NOTE — Progress Notes (Signed)
D: Patient denies SI/HI/AVH.  Patient affect is depressed. Mood is depressed.  Patient did attend evening group. Patient visible on the milieu. Patient had an issue with hyperglycemia.  MD was contacted for stabilization.  A: Support and encouragement offered. Scheduled medications given to pt. Q 15 min checks continued for patient safety. R: Patient receptive. Patient remains safe on the unit.

## 2015-03-10 NOTE — BHH Group Notes (Signed)
[facility] LCSW Group Therapy  03/10/2015 3:25 PM  Type of Therapy:  Group Therapy  Participation Level:  Did Not Attend   Keene Breath, MSW, LCSWA 03/10/2015, 3:25 PM

## 2015-03-10 NOTE — Progress Notes (Addendum)
Spoke with patient regarding home diabetes management.  He states that his blood sugars usually go up with detox.  He has seen the NP's at the [facility] in [place] for his diabetes, however it has been over a year.  He states he was on Lantus one time for 2 months and he felt much better.  Currently patient does not have a glucose meter nor a PCP to manage his blood sugars. He is also in the process of trying to find a place to live after discharge. He is planning to call one of the [facility] homes.Ordered patient Living well with diabetes booklet and explained importance of both checking blood sugars and seeing a doctor for his diabetes management. He verbalized understanding.  He also admits that he is eating more in the hospital and not moving around as much. Spoke with Education officer, museum regarding patient's needs for follow-up with PCP after discharge regarding diabetes management. He states that he will work on getting patient an appointment based on where he will be d/c'd ([place]).  Thanks, Adah Perl, RN, BC-ADM Inpatient Diabetes Coordinator Pager [phone] (8a-5p)

## 2015-03-10 NOTE — Progress Notes (Signed)
[facility] MD Progress Note  03/10/2015 2:03 PM [name]  MRN:  [id number] Subjective: Patient was in his bed where he is typically been found. He is not really participating in programming. He states feeling very tired as he did not slept well last night.  Pt feels sad and depressed about his current social stressors.  He was voted out by peers from the recovery house were we lived for 3 months. He relapse as he found himself homeless.  He drank heavy for about 3 weeks prior to this admission.  He is currently working remodeling houses but says is getting harder and harder for him to perform as he is not "a young boy anymore".  His appetite and energy are poor.  He denies SI, HI or hallucinations. Denies SE from medications. Denies other physical complaints.  Principal Problem: Major depressive disorder, single episode, moderate ([place]) Diagnosis:   Patient Active Problem List   Diagnosis Date Noted  . Major depressive disorder, single episode, moderate ([place]) [F32.1] 03/10/2015  . Alcohol dependence with uncomplicated withdrawal ([place]) [F10.230] 03/03/2015  . Substance induced mood disorder ([place]) [F19.94] 03/03/2015  . Alcohol abuse [F10.10]   . Suicidal ideation [R45.851]    Total Time spent with patient: 30 minutes  Past Psychiatric History:   Past Medical History:  Past Medical History  Diagnosis Date  . Alcoholism /alcohol abuse ([place])   . Diabetes mellitus without complication ([place])   . Hepatitis C   . Neuropathy ([place])    History reviewed. No pertinent past surgical history. Family History: History reviewed. No pertinent family history. Family Psychiatric  History:  Social History:  History  Alcohol Use  . Yes     History  Drug Use No    Social History   Social History  . Marital Status: Divorced    Spouse Name: N/A  . Number of Children: N/A  . Years of Education: N/A   Social History Main Topics  . Smoking status: Current Every Day Smoker -- 1.00 packs/day   Types: Cigarettes  . Smokeless tobacco: None  . Alcohol Use: Yes  . Drug Use: No  . Sexual Activity: Not Asked   Other Topics Concern  . None   Social History Narrative    Sleep: Good  Appetite:  Good  Current Medications: Current Facility-Administered Medications  Medication Dose Route Frequency Provider Last Rate Last Dose  . acetaminophen (TYLENOL) tablet 650 mg  650 mg Oral Q6H PRN Marjie Skiff, MD      . alum & mag hydroxide-simeth (MAALOX/MYLANTA) 200-200-20 MG/5ML suspension 30 mL  30 mL Oral Q4H PRN Marjie Skiff, MD      . gabapentin (NEURONTIN) capsule 600 mg  600 mg Oral TID Marjie Skiff, MD   600 mg at 03/10/15 1015  . hydrOXYzine (ATARAX/VISTARIL) tablet 50 mg  50 mg Oral TID Hildred Priest, MD      . insulin aspart (novoLOG) injection 0-15 Units  0-15 Units Subcutaneous TID WC Hildred Priest, MD      . insulin aspart (novoLOG) injection 0-5 Units  0-5 Units Subcutaneous QHS Hildred Priest, MD      . insulin glargine (LANTUS) injection 10 Units  10 Units Subcutaneous QHS Hildred Priest, MD      . magnesium hydroxide (MILK OF MAGNESIA) suspension 30 mL  30 mL Oral Daily PRN Marjie Skiff, MD      . metFORMIN (GLUCOPHAGE) tablet 1,000 mg  1,000 mg Oral BID WC Marjie Skiff, MD  1,000 mg at 03/10/15 0742  . mirtazapine (REMERON) tablet 15 mg  15 mg Oral QHS Hildred Priest, MD      . prazosin (MINIPRESS) capsule 1 mg  1 mg Oral QHS Marjie Skiff, MD   1 mg at 03/09/15 2143    Lab Results:  Results for orders placed or performed during the hospital encounter of 03/04/15 (from the past 48 hour(s))  Glucose, capillary     Status: Abnormal   Collection Time: 03/08/15  4:33 PM  Result Value Ref Range   Glucose-Capillary 287 (H) 65 - 99 mg/dL  Glucose, capillary     Status: Abnormal   Collection Time: 03/09/15  6:12 AM  Result Value Ref Range   Glucose-Capillary 273 (H) 65 - 99 mg/dL  Glucose,  capillary     Status: Abnormal   Collection Time: 03/09/15 12:09 PM  Result Value Ref Range   Glucose-Capillary 420 (H) 65 - 99 mg/dL   Comment 1 Notify RN   Glucose, capillary     Status: Abnormal   Collection Time: 03/09/15  4:37 PM  Result Value Ref Range   Glucose-Capillary 313 (H) 65 - 99 mg/dL   Comment 1 Notify RN   Glucose, capillary     Status: Abnormal   Collection Time: 03/09/15  8:17 PM  Result Value Ref Range   Glucose-Capillary 359 (H) 65 - 99 mg/dL   Comment 1 Notify RN   Glucose, capillary     Status: Abnormal   Collection Time: 03/09/15 11:04 PM  Result Value Ref Range   Glucose-Capillary 510 (H) 65 - 99 mg/dL  Glucose, capillary     Status: Abnormal   Collection Time: 03/10/15 12:03 AM  Result Value Ref Range   Glucose-Capillary 408 (H) 65 - 99 mg/dL  Glucose, capillary     Status: Abnormal   Collection Time: 03/10/15 12:59 AM  Result Value Ref Range   Glucose-Capillary 345 (H) 65 - 99 mg/dL  Glucose, capillary     Status: Abnormal   Collection Time: 03/10/15  6:39 AM  Result Value Ref Range   Glucose-Capillary 220 (H) 65 - 99 mg/dL  Glucose, capillary     Status: Abnormal   Collection Time: 03/10/15 11:58 AM  Result Value Ref Range   Glucose-Capillary 384 (H) 65 - 99 mg/dL    Physical Findings: AIMS:  , ,  ,  ,    CIWA:  CIWA-Ar Total: 0 COWS:     Musculoskeletal: Strength & Muscle Tone: within normal limits Gait & Station: normal Patient leans: N/A  Psychiatric Specialty Exam: Review of Systems  Psychiatric/Behavioral: Positive for substance abuse. Negative for depression, suicidal ideas, hallucinations and memory loss. The patient is not nervous/anxious and does not have insomnia.   All other systems reviewed and are negative.   Blood pressure 153/90, pulse 76, temperature 98.9 F (37.2 C), temperature source Oral, resp. rate 20, height 6' (1.829 m), weight 72.576 kg (160 lb), SpO2 98 %.Body mass index is 21.7 kg/(m^2).  General Appearance:  Fairly Groomed  Engineer, water::  Good  Speech:  Normal Rate  Volume:  Normal  Mood:  Good  Affect:  Constricted  Thought Process:  Linear  Orientation:  Full (Time, Place, and Person)  Thought Content:  Negative  Suicidal Thoughts:  No  Homicidal Thoughts:  No  Memory:  Immediate;   Good Recent;   Good Remote;   Good  Judgement:  Fair  Insight:  Fair  Psychomotor Activity:  Negative  Concentration:  Good  Recall:  Good  Fund of Knowledge:Fair  Language: Good  Akathisia:  Negative  Handed:  Right  AIMS (if indicated):     Assets:  Vocational/Educational  ADL's:  Intact  Cognition: WNL  Sleep:  Number of Hours: 1.25   Treatment Plan Summary:  Daily contact with patient to assess and evaluate symptoms and progress in treatment, Medication management and Plan   Alcohol use disorder: no evidence of withdrawal at [facility] time.  Will d/c CIWA   Depression/insomnia: I will start the pt on mirtazapine 15 mg po qhs  Diabetes: poorly controlled diabetes.  Will change sliding scale and add qhs coverage.  I will also add lantus as recommended by diabetes coordinator. Continue-metformin 1000 mg  twice a day.  Neuropathy: continue Neurontin 600 mg 3 times a day  Insomnia: continue prazosin 1 mg po qhs for nightmares.  Dispo: plan to d/c tomorrow.  Pt is to call [facility] today in order to find a place to go.   Hildred Priest 03/10/2015, 2:03 PM

## 2015-03-10 NOTE — Progress Notes (Signed)
Inpatient Diabetes Program Recommendations  AACE/ADA: New Consensus Statement on Inpatient Glycemic Control (2015)  Target Ranges:  Prepandial:   less than 140 mg/dL      Peak postprandial:   less than 180 mg/dL (1-2 hours)      Critically ill patients:  140 - 180 mg/dL   Review of Glycemic Control:  Results for VEGAS, FRITZE (MRN [id number]) as of 03/10/2015 14:40  Ref. Range 03/09/2015 23:04 03/10/2015 00:03 03/10/2015 00:59 03/10/2015 06:39 03/10/2015 11:58  Glucose-Capillary Latest Ref Range: 65-99 mg/dL 510 (H) 408 (H) 345 (H) 220 (H) 384 (H)    Diabetes history: Type 2 diabetes Outpatient Diabetes medications: Metformin 1000 mg bid Current orders for Inpatient glycemic control: Novolog moderate tid with meals and HS, Metformin 1000 mg bid  Inpatient Diabetes Program Recommendations:  Referral received regarding start of basal insulin.  Upon chart review, note that patient does not have medication coverage and is currently homeless.  Unsure of whether he has PCP?  May consider only adding sulfonylurea such as Amaryl 4 mg daily (on 4$ list at [facility]). Needs follow-up with PCP as soon as possible regarding diabetes management.  Currently no A1C is available.  Will discuss with patient.    Thanks, Adah Perl, RN, BC-ADM Inpatient Diabetes Coordinator Pager [phone] (8a-5p)

## 2015-03-10 NOTE — Plan of Care (Signed)
Problem: [facility] Participation in Recreation Therapeutic Interventions Goal: STG-Patient will identify at least five coping skills for ** STG: Coping Skills - Within 4 treatment sessions, patient will verbalize at least 5 coping skills for substance abuse in each of 2 treatment sessions to decrease substance abuse post d/c.  Outcome: Progressing Treatment Session 1; Completed 1 out of 1: At approximately 2:35 pm, LRT met with patient in patient room. Patient verbalized 5 coping skills for substance abuse. LRT educated patient on leisure and why it is important to implement it into his schedule. LRT educated and provided patient with blank schedules to help him plan his day and try to avoid using substances. LRT educated patient on healthy support systems.  Leonette [facility], LRT/CTRS 10.31.16 4:52 pm

## 2015-03-11 LAB — GLUCOSE, CAPILLARY: GLUCOSE-CAPILLARY: 271 mg/dL — AB (ref 65–99)

## 2015-03-11 MED ORDER — HYDROXYZINE HCL 50 MG PO TABS: 50.0000 mg | ORAL_TABLET | Freq: Three times a day (TID) | ORAL | Status: DC | PRN

## 2015-03-11 NOTE — Progress Notes (Signed)
Patient discharged home via bus. Instructions reviewed. Patient verbalized understanding of meds and follow up care. Belongings returned. Denies SI/HI/AVH.

## 2015-03-11 NOTE — Progress Notes (Signed)
Recreation Therapy Notes  INPATIENT RECREATION TR PLAN  Patient Details Name: [name] MRN: [id number] DOB: 1956-05-06 Today's Date: 03/11/2015  Rec Therapy Plan Is patient appropriate for Therapeutic Recreation?: Yes Treatment times per week: At least once a week TR Treatment/Interventions: 1:1 session, Group participation (Comment) (Appropriate participation in daily recreation therapy tx)  Discharge Criteria Pt will be discharged from therapy if:: Treatment goals are met, Discharged Treatment plan/goals/alternatives discussed and agreed upon by:: Patient/family  Discharge Summary Short term goals set: See Care Plan Short term goals met: Complete Progress toward goals comments: One-to-one attended One-to-one attended: Coping skills Reason goals not met: N/A Therapeutic equipment acquired: None Reason patient discharged from therapy: Discharge from hospital Pt/family agrees with progress & goals achieved: Yes Date patient discharged from therapy: 03/11/15   Leonette Monarch, LRT/CTRS 03/11/2015, 12:41 PM

## 2015-03-11 NOTE — Progress Notes (Signed)
  [facility] Adult Case Management Discharge Plan :  Will you be returning to the same living situation after discharge:  No. Will be going to [facility] for shelter after work today  At discharge, do you have transportation home?: No. Patient will be given $3 fare for PART bus to [place] to work today Do you have the ability to pay for your medications: No. Patient is given 7 day supply of meds and will followup with [facility] and reports he gets diabetes meds from [facility] on Saturday during after hour walk in appt.   Release of information consent forms completed and in the chart;  Patient's signature needed at discharge.  Patient to Follow up at: Follow-up Information    Follow up with [facility]. Go on 03/13/2015.   Why:  For follow-up care appt Thursday 03/13/15 at 8:00am (walk in hours M-F 8-4)   Contact information:   [address], [place] Ph [phone] Fax [phone]      Patient denies SI/HI: Yes,  patient denies SI/HI    Safety Planning and Suicide Prevention discussed: Yes,  SPE provided to patient but patient refused family contact for further SPE   Have you used any form of tobacco in the last 30 days? (Cigarettes, Smokeless Tobacco, Cigars, and/or Pipes): Yes  Has patient been referred to the Quitline?: Patient refused referral  Keene Breath, MSW, LCSWA 03/11/2015, 10:09 AM

## 2015-03-11 NOTE — Discharge Summary (Signed)
Physician Discharge Summary Note  Patient:  [name] is an 59 y.o., male MRN:  [id number] DOB:  Nov 01, 1955 Patient phone:  [phone] (home)  Patient address:   [place] [address],  Total Time spent with patient: 30 minutes  Date of Admission:  03/04/2015 Date of Discharge: 03/11/15  Reason for Admission:  Suicidal ideation  Principal Problem: Major depressive disorder, single episode, moderate ([place]) Discharge Diagnoses: Patient Active Problem List   Diagnosis Date Noted  . Major depressive disorder, single episode, moderate ([place]) [F32.1] 03/10/2015  . Alcohol dependence with uncomplicated withdrawal ([place]) [F10.230] 03/03/2015  . Substance induced mood disorder ([place]) [F19.94] 03/03/2015  . Alcohol abuse [F10.10]   . Suicidal ideation [R45.851]     Musculoskeletal: Strength & Muscle Tone: within normal limits Gait & Station: normal Patient leans: N/A  Psychiatric Specialty Exam: Physical Exam  Constitutional: [name] is oriented to person, place, and time. [name] appears well-developed and well-nourished.  HENT:  Head: Normocephalic and atraumatic.  Eyes: Conjunctivae and EOM are normal.  Neck: Normal range of motion.  Respiratory: Effort normal.  Musculoskeletal: Normal range of motion.  Neurological: [name] is alert and oriented to person, place, and time.  Skin: Skin is warm and dry.    Review of Systems  Constitutional: Negative.   HENT: Negative.   Eyes: Negative.   Respiratory: Negative.   Cardiovascular: Negative.   Gastrointestinal: Negative.   Genitourinary: Negative.   Musculoskeletal: Negative.   Skin: Negative.   Neurological: Negative.   Endo/Heme/Allergies: Negative.   Psychiatric/Behavioral: Negative.     Blood pressure 135/85, pulse 87, temperature 98.6 F (37 C), temperature source Oral, resp. rate 20, height 6' (1.829 m), weight 72.576 kg (160 lb), SpO2 98 %.Body mass index is 21.7 kg/(m^2).  General Appearance: Well Groomed  Chemical engineer::  Good  Speech:  Clear and Coherent  Volume:  Normal  Mood:  Euthymic  Affect:  Appropriate and Congruent  Thought Process:  Linear and Logical  Orientation:  Full (Time, Place, and Person)  Thought Content:  Hallucinations: None  Suicidal Thoughts:  No  Homicidal Thoughts:  No  Memory:  Immediate;   Good Recent;   Good Remote;   Good  Judgement:  Good  Insight:  Good  Psychomotor Activity:  Normal  Concentration:  Good  Recall:  NA  Fund of Knowledge:Good  Language: NA  Akathisia:  No  Handed:    AIMS (if indicated):     Assets:  Nurse, adult  ADL's:  Intact  Cognition: WNL  Sleep:  Number of Hours: 1.25    History of Present Illness:: Patient indicates that [name] has had issues with alcohol for the past 28 years. [name] states that [name] ate recently ended a 3 month period of sobriety. [name] states things were going well [name] was living in [place] and working full-time in [employer]. [name] states that what started his relapse was that [name] was a senior member of the [employer] and tried to enforce the rules on newer members. [name] states that eventually these newer members grew and devoted him out of the house. [name] states [name] has been out of the [facility] for 3 weeks and that is when his alcohol use resumed. [name] states [name]'s drinking 5-640 ounce beers a day.  When asked about depressive symptoms [name] denies issues with depression outside of when [name] is drinking. [name] denies issues with anxiety outside of when [name] is drinking. [name] states these are currently issues but  largely associates them with his alcohol use. [name] denies any suicidal ideation or any past suicide attempts. Associated Signs/Symptoms: Depression Symptoms: depressed mood, anhedonia, insomnia, disturbed sleep, (Hypo) Manic Symptoms: None Anxiety Symptoms: Some anxiety related to being away from alcohol and per patient report withdrawal Psychotic Symptoms: None PTSD  Symptoms: Negative Total Time spent with patient: 1 hour  Past Psychiatric History: Patient reports most recent treatment was at our [facility] 4 months ago. [name] states [name] was also at [facility]. [name] states his longest period of sobriety was 6 years when [name] was raising his young children. [name] believes [name]'s been in treatment facilities 20 times. [name] denies any suicide attempts. [name] denies having any counseling or medication management states that [name]'s never had mood disturbance aside from when [name] is drinking   Past Medical History:  Past Medical History  Diagnosis Date  . Alcoholism /alcohol abuse ([place])   . Diabetes mellitus without complication ([place])   . Hepatitis C   . Neuropathy ([place])    History reviewed. No pertinent past surgical history. Family History: History reviewed. No pertinent family history. Family Psychiatric History: [name] denies any knowledge of any family history of mental illness Social History:  History  Alcohol Use  . Yes    History  Drug Use No    Social History   Social History  . Marital Status: Divorced    Spouse Name: N/A  . Number of Children: N/A  . Years of Education: N/A   Social History Main Topics  . Smoking status: Current Every Day Smoker -- 1.00 packs/day    Types: Cigarettes  . Smokeless tobacco: None  . Alcohol Use: Yes  . Drug Use: No  . Sexual Activity: Not Asked        Hospital Course:   Alcohol induced depressive disorder vs MDD: patient relapse on alcohol 3 weeks ago after 3 months of sobriety.  [name] was voted out by his peers from the recovery house a month ago as [name] was complaining that the house was dirty and messy.  [name] is now homeless and feels depressed about it.  [name] was started on remeron 15 mg po qhs to target depressed mood and insomnia.  Alcohol use disorder: during this hospitalization [name] received a benzodiazepine taper to prevent complications from alcohol  withdrawal.  Sleep issues/nightmares/insomnia: [name] was started on prazosin 1 mg po qhs to target nightmares.    Diabetes: poorly controlled diabetes. Per recommendation from diabetes coordinator [name] was started on amaryl 4 mg.  [name] was continued on metformin 1000 mg po bid.   Neuropathy: continue Neurontin 600 mg 3 times a day  Dispo: plan to d/c today back to [facility].  Follow up plan: patient will be referred to [facility]. [name] is also connected with primary care in [place].  During his stay in the hospital his participation in group was minimal.  [name] was pleasant, calm and cooperative with staff and peers.  There were no reports of behavioral problems.  There was no need for seclusion, restraints or forced medications.  On the day of discharge patient mood was euthymic and his affect was reactive-bright.  [name] denied having SI, hopelessness or helplessness.  [name] denied having SE from medications or any physical complaints.    Discharge Vitals:   Blood pressure 135/85, pulse 87, temperature 98.6 F (37 C), temperature source Oral, resp. rate 20, height 6' (1.829 m), weight 72.576 kg (160 lb), SpO2 98 %. Body mass index is 21.7 kg/(m^2).  Lab Results:   Results for AVIK, LEONI (MRN [id number]) as of 03/11/2015 10:42  Ref. Range 03/02/2015 21:23 03/02/2015 22:45 03/04/2015 14:12 03/10/2015 16:07  Sodium Latest Ref Range: 135-145 mmol/L  129 (L) 129 (L) 131 (L)  Potassium Latest Ref Range: 3.5-5.1 mmol/L  4.5 4.7 4.7  Chloride Latest Ref Range: 101-111 mmol/L  91 (L) 92 (L) 96 (L)  CO2 Latest Ref Range: 22-32 mmol/L  _0 BUN Latest Ref Range: 6-20 mg/dL  _1 (H)  Creatinine Latest Ref Range: 0.61-1.24 mg/dL  0.70 0.62 0.63  Calcium Latest Ref Range: 8.9-10.3 mg/dL  8.6 (L) 9.2 9.5  EGFR (Non-African Amer.) Latest Ref Range: >60 mL/min  >60 >60 >60  EGFR (African American) Latest Ref Range: >60 mL/min  >60 >60 >60  Glucose Latest Ref Range: 65-99 mg/dL  301 (H) 369  (H) 360 (H)  Anion gap Latest Ref Range: 5-_2 Alkaline Phosphatase Latest Ref Range: 38-126 U/L  130 (H) 133 (H)   Albumin Latest Ref Range: 3.5-5.0 g/dL  3.8 3.2 (L)   AST Latest Ref Range: 15-41 U/L  131 (H) 69 (H)   ALT Latest Ref Range: 17-63 U/L  122 (H) 76 (H)   Total Protein Latest Ref Range: 6.5-8.1 g/dL  8.4 (H) 7.0   Total Bilirubin Latest Ref Range: 0.3-1.2 mg/dL  0.9 1.0   WBC Latest Ref Range: 4.0-10.5 K/uL  6.7    RBC Latest Ref Range: 4.22-5.81 MIL/uL  4.13 (L)    Hemoglobin Latest Ref Range: 13.0-17.0 g/dL  14.2    HCT Latest Ref Range: 39.0-52.0 %  40.1    MCV Latest Ref Range: 78.0-100.0 fL  97.1    MCH Latest Ref Range: 26.0-34.0 pg  34.4 (H)    MCHC Latest Ref Range: 30.0-36.0 g/dL  35.4    RDW Latest Ref Range: 11.5-15.5 %  13.5    Platelets Latest Ref Range: 150-400 K/uL  80 (L)    Salicylate Lvl Latest Ref Range: 2.8-30.0 mg/dL  <4.0    Acetaminophen Latest Ref Range: 10-30 ug/mL  <10 (L)    Alcohol, Ethyl (B) Latest Ref Range: <5 mg/dL  285 (H)    Amphetamines Latest Ref Range: NONE DETECTED  NONE DETECTED     Barbiturates Latest Ref Range: NONE DETECTED  NONE DETECTED     Benzodiazepines Latest Ref Range: NONE DETECTED  NONE DETECTED     Opiates Latest Ref Range: NONE DETECTED  NONE DETECTED     COCAINE Latest Ref Range: NONE DETECTED  NONE DETECTED     Tetrahydrocannabinol Latest Ref Range: NONE DETECTED  NONE DETECTED       Discharge Instructions    Diet Carb Modified    Complete by:  As directed             Medication List    TAKE these medications      Indication   gabapentin 300 MG capsule  Commonly known as:  NEURONTIN  Take 2 capsules (600 mg total) by mouth 3 (three) times daily.  Notes to Patient:  pain      glimepiride 4 MG tablet  Commonly known as:  AMARYL  Take 1 tablet (4 mg total) by mouth daily with breakfast.  Notes to Patient:  diabetes      metFORMIN 1000 MG tablet  Commonly known as:  GLUCOPHAGE  Take 1 tablet  (1,000 mg total) by mouth 2 (two) times daily with a meal.  Notes to Patient:  diabetes      mirtazapine 15 MG tablet  Commonly known as:  REMERON  Take 1 tablet (15 mg total) by mouth at bedtime.  Notes to Patient:  Depression/insomnia      prazosin 1 MG capsule  Commonly known as:  MINIPRESS  Take 1 capsule (1 mg total) by mouth at bedtime.  Notes to Patient:  nightmares            Follow-up Information    Follow up with [facility]. Go on 03/13/2015.   Why:  For follow-up care appt Thursday 03/13/15 at 8:00am (walk in hours M-F 8-4)   Contact information:   [address], [place] [phone] Fax [phone]       Total Discharge Time: 30 minutes  Signed: Hildred Priest 03/11/2015, 10:41 AM

## 2015-03-11 NOTE — BHH Suicide Risk Assessment (Signed)
[facility] INPATIENT:  Family/Significant Other Suicide Prevention Education  Suicide Prevention Education:  Patient Refusal for Family/Significant Other Suicide Prevention Education: The patient [name] has refused to provide written consent for family/significant other to be provided Family/Significant Other Suicide Prevention Education during admission and/or prior to discharge.  Physician notified.  Keene Breath, MSW, LCSWA 03/11/2015, 10:08 AM

## 2015-03-11 NOTE — Plan of Care (Signed)
Problem: [facility] Participation in Recreation Therapeutic Interventions Goal: STG-Patient will identify at least five coping skills for ** STG: Coping Skills - Within 4 treatment sessions, patient will verbalize at least 5 coping skills for substance abuse in each of 2 treatment sessions to decrease substance abuse post d/c.  Outcome: Completed/Met Date Met:  03/11/15 Treatment Session 2; Completed 2 out of 2: At approximately 10:35 am, LRT met with patient in patient room. Patient verbalized 5 positive affirmation statements. LRT encouraged patient to participate in leisure activities. Intervention Used: Coping skills worksheet  Leonette [facility], LRT/CTRS 11.01.16 12:40 pm

## 2015-03-11 NOTE — BHH Suicide Risk Assessment (Signed)
[facility] Discharge Suicide Risk Assessment   Demographic Factors:  Male, Caucasian and Low socioeconomic status  Total Time spent with patient: 30 minutes    Psychiatric Specialty Exam: Physical Exam  ROS  Have you used any form of tobacco in the last 30 days? (Cigarettes, Smokeless Tobacco, Cigars, and/or Pipes): Yes  Has this patient used any form of tobacco in the last 30 days? (Cigarettes, Smokeless Tobacco, Cigars, and/or Pipes) Yes, A prescription for an FDA-approved tobacco cessation medication was offered at discharge and the patient refused  Mental Status Per Nursing Assessment::   On Admission:     Current Mental Status by Physician: no longer voicing SI, HI or hopelessness, helplessness.  Mood euthymic-affect bright  Loss Factors: Financial problems/change in socioeconomic status  Historical Factors: Impulsivity  Risk Reduction Factors:   Employed  Continued Clinical Symptoms:  Depression:   Comorbid alcohol abuse/dependence Alcohol/Substance Abuse/Dependencies Previous Psychiatric Diagnoses and Treatments  Cognitive Features That Contribute To Risk:  None    Suicide Risk:  Minimal: No identifiable suicidal ideation.  Patients presenting with no risk factors but with morbid ruminations; may be classified as minimal risk based on the severity of the depressive symptoms  Principal Problem: Major depressive disorder, single episode, moderate ([place]) Discharge Diagnoses:  Patient Active Problem List   Diagnosis Date Noted  . Major depressive disorder, single episode, moderate ([place]) [F32.1] 03/10/2015  . Alcohol dependence with uncomplicated withdrawal ([place]) [F10.230] 03/03/2015  . Substance induced mood disorder ([place]) [F19.94] 03/03/2015  . Alcohol abuse [F10.10]   . Suicidal ideation [R45.851]     Follow-up Information    Follow up with [facility]. Go on 03/13/2015.   Why:  For follow-up care appt Thursday 03/13/15 at 8:00am (walk in hours M-F 8-4)   Contact  information:   [address], [place] Ph [phone] Fax [phone]       Is patient on multiple antipsychotic therapies at discharge:  No   Has Patient had three or more failed trials of antipsychotic monotherapy by history:  No  Recommended Plan for Multiple Antipsychotic Therapies: NA    Hildred Priest 03/11/2015, 10:40 AM

## 2015-03-11 NOTE — Progress Notes (Signed)
D: Patient denies SI/HI/AVH.   Patient affect is depressed and his mood is anxious.  Patient did attend evening group. Patient visible on the milieu. No distress noted. A: Support and encouragement offered. Scheduled medications given to pt. Q 15 min checks continued for patient safety. R: Patient receptive. Patient remains safe on the unit.

## 2015-03-22 ENCOUNTER — Encounter (HOSPITAL_COMMUNITY): Payer: Self-pay | Admitting: Cardiology

## 2015-03-28 ENCOUNTER — Encounter (HOSPITAL_COMMUNITY): Payer: Self-pay | Admitting: Emergency Medicine

## 2015-03-28 ENCOUNTER — Emergency Department (HOSPITAL_COMMUNITY)
Admission: EM | Admit: 2015-03-28 | Discharge: 2015-03-28 | Disposition: A | Discharge status: Home / Self Care | Payer: Self-pay | Attending: Emergency Medicine | Admitting: Emergency Medicine

## 2015-03-28 DIAGNOSIS — E119 Type 2 diabetes mellitus without complications: Secondary | ICD-10-CM | POA: Insufficient documentation

## 2015-03-28 DIAGNOSIS — F329 Major depressive disorder, single episode, unspecified: Secondary | ICD-10-CM | POA: Insufficient documentation

## 2015-03-28 DIAGNOSIS — F419 Anxiety disorder, unspecified: Secondary | ICD-10-CM | POA: Insufficient documentation

## 2015-03-28 DIAGNOSIS — F17210 Nicotine dependence, cigarettes, uncomplicated: Secondary | ICD-10-CM | POA: Insufficient documentation

## 2015-03-28 DIAGNOSIS — Z862 Personal history of diseases of the blood and blood-forming organs and certain disorders involving the immune mechanism: Secondary | ICD-10-CM | POA: Insufficient documentation

## 2015-03-28 DIAGNOSIS — G629 Polyneuropathy, unspecified: Secondary | ICD-10-CM | POA: Insufficient documentation

## 2015-03-28 DIAGNOSIS — Z79899 Other long term (current) drug therapy: Secondary | ICD-10-CM | POA: Insufficient documentation

## 2015-03-28 DIAGNOSIS — Z794 Long term (current) use of insulin: Secondary | ICD-10-CM | POA: Insufficient documentation

## 2015-03-28 DIAGNOSIS — Z8719 Personal history of other diseases of the digestive system: Secondary | ICD-10-CM | POA: Insufficient documentation

## 2015-03-28 DIAGNOSIS — R000 Tachycardia, unspecified: Secondary | ICD-10-CM | POA: Insufficient documentation

## 2015-03-28 DIAGNOSIS — Z8619 Personal history of other infectious and parasitic diseases: Secondary | ICD-10-CM | POA: Insufficient documentation

## 2015-03-28 DIAGNOSIS — L03113 Cellulitis of right upper limb: Secondary | ICD-10-CM | POA: Insufficient documentation

## 2015-03-28 LAB — COMPREHENSIVE METABOLIC PANEL
ALK PHOS: 157 U/L — AB (ref 38–126)
ALT: 36 U/L (ref 17–63)
AST: 33 U/L (ref 15–41)
Albumin: 3.1 g/dL — ABNORMAL LOW (ref 3.5–5.0)
Anion gap: 9 (ref 5–15)
BILIRUBIN TOTAL: 0.7 mg/dL (ref 0.3–1.2)
BUN: 17 mg/dL (ref 6–20)
CHLORIDE: 89 mmol/L — AB (ref 101–111)
CO2: 26 mmol/L (ref 22–32)
Calcium: 8.7 mg/dL — ABNORMAL LOW (ref 8.9–10.3)
Creatinine, Ser: 0.77 mg/dL (ref 0.61–1.24)
GFR calc Af Amer: >60 mL/min (ref >60)
GFR calc non Af Amer: >60 mL/min (ref >60)
Glucose, Bld: 603 mg/dL (ref 65–99)
Potassium: 3.9 mmol/L (ref 3.5–5.1)
Sodium: 124 mmol/L — ABNORMAL LOW (ref 135–145)
Total Protein: 6.7 g/dL (ref 6.5–8.1)

## 2015-03-28 LAB — CBC WITH DIFFERENTIAL/PLATELET
BASOS ABS: 0.1 10*3/uL (ref 0.0–0.1)
Basophils Relative: 0 %
Eosinophils Absolute: 0.2 10*3/uL (ref 0.0–0.7)
Eosinophils Relative: 2 %
HCT: 33 % — ABNORMAL LOW (ref 39.0–52.0)
HEMOGLOBIN: 11.9 g/dL — AB (ref 13.0–17.0)
LYMPHS ABS: 2.8 10*3/uL (ref 0.7–4.0)
LYMPHS PCT: 19 %
MCH: 34.6 pg — ABNORMAL HIGH (ref 26.0–34.0)
MCHC: 36.1 g/dL — ABNORMAL HIGH (ref 30.0–36.0)
MCV: 95.9 fL (ref 78.0–100.0)
Monocytes Absolute: 1.2 10*3/uL — ABNORMAL HIGH (ref 0.1–1.0)
Monocytes Relative: 8 %
NEUTROS ABS: 10.2 10*3/uL — AB (ref 1.7–7.7)
Neutrophils Relative %: 71 %
Platelets: 82 10*3/uL — ABNORMAL LOW (ref 150–400)
RBC: 3.44 MIL/uL — AB (ref 4.22–5.81)
RDW: 13.2 % (ref 11.5–15.5)
WBC: 14.4 10*3/uL — ABNORMAL HIGH (ref 4.0–10.5)

## 2015-03-28 LAB — CBG MONITORING, ED: GLUCOSE-CAPILLARY: 346 mg/dL — AB (ref 65–99)

## 2015-03-28 MED ORDER — INSULIN ASPART 100 UNIT/ML ~~LOC~~ SOLN
14.0000 [IU] | Freq: Once | SUBCUTANEOUS | Status: AC
  Administered 2015-03-28: 14 [IU] via SUBCUTANEOUS
  Filled 2015-03-28: qty 1

## 2015-03-28 MED ORDER — IBUPROFEN 200 MG PO TABS
400.0000 mg | ORAL_TABLET | Freq: Once | ORAL | Status: AC
  Administered 2015-03-28: 400 mg via ORAL
  Filled 2015-03-28: qty 2

## 2015-03-28 MED ORDER — CEPHALEXIN 500 MG PO CAPS
1000.0000 mg | ORAL_CAPSULE | Freq: Once | ORAL | Status: AC
  Administered 2015-03-28: 1000 mg via ORAL
  Filled 2015-03-28: qty 2

## 2015-03-28 MED ORDER — CEPHALEXIN 500 MG PO CAPS: 500.0000 mg | ORAL_CAPSULE | Freq: Four times a day (QID) | ORAL | Status: DC

## 2015-03-28 MED ORDER — SODIUM CHLORIDE 0.9 % IV BOLUS (SEPSIS)
2000.0000 mL | Freq: Once | INTRAVENOUS | Status: AC
  Administered 2015-03-28: 2000 mL via INTRAVENOUS

## 2015-03-28 NOTE — ED Provider Notes (Signed)
CSN: [id number]     Arrival date & time 03/28/15  1520 History   First MD Initiated Contact with Patient 03/28/15 1643     Chief Complaint  Patient presents with  . Arm Swelling     (Consider location/radiation/quality/duration/timing/severity/associated sxs/prior Treatment) HPI Complains of right forearm pain gradual onset 2 days ago accompanied by swelling and redness. Pain swelling and redness are diffusely across the dorsum of his right forearm. He noticed a "bump" about the size of a quarter at the dorsum of his right forearm, distal third 2 days ago which was sore. That area since spread and become to become red and painful. No trauma. He treated himself with ibuprofen this morning with improvement of pain. He denies fever denies vomiting denies other symptoms. He admits to noncompliance with his metformin for past several days due to having run out however he has a refill waiting for him at his pharmacy which she will get later today. No other associated symptoms. Pain is moderate present. Nothing makes symptoms better or worse. No other associated symptoms. Past Medical History  Diagnosis Date  . Diabetes mellitus   . Chronic hyponatremia     pt denies having   . Alcoholic ([place])   . Alcoholic cirrhosis of liver ([place])     Pt calls this a false diagnosis. Pt denies  . Thrombocytopenia ([place])   . Hyperglycemia   . Depression   . Anxiety   . Alcoholism /alcohol abuse ([place])   . Diabetes mellitus without complication ([place])   . Hepatitis C   . Neuropathy [facility])    Past Surgical History  Procedure Laterality Date  . Plate in lower right leg  2008  . Fracture surgery  right lower leg plate placed years ago   Family History  Problem Relation Age of Onset  . ALS Father    Social History  Substance Use Topics  . Smoking status: Current Every Day Smoker -- 1.00 packs/day for 40 years    Types: Cigarettes  . Smokeless tobacco: None  . Alcohol Use: Yes     Comment: 18/daily     Review of Systems  Constitutional: Negative.   HENT: Negative.   Respiratory: Negative.   Cardiovascular: Negative.   Gastrointestinal: Negative.   Musculoskeletal: Positive for myalgias.       Right arm pain and swelling  Skin: Negative.   Allergic/Immunologic:       Diabetic, alcoholic. Current on tetanus status  Neurological:       Chronic neuropathy in legs bilaterally  Psychiatric/Behavioral: Negative.   All other systems reviewed and are negative.     Allergies  Benadryl; Benadryl; Sulfa antibiotics; Tegretol; Librium; and Sulfa antibiotics  Home Medications   Prior to Admission medications   Medication Sig Start Date End Date Taking? Authorizing Provider  acetaminophen (TYLENOL) 500 MG tablet Take 500 mg by mouth every 6 (six) hours as needed.   Yes Historical Provider, MD  gabapentin (NEURONTIN) 300 MG capsule Take 2 capsules (600 mg total) by mouth 3 (three) times daily. 03/10/15  Yes Hildred Priest, MD  ibuprofen (ADVIL,MOTRIN) 200 MG tablet Take 800 mg by mouth 2 (two) times daily as needed for moderate pain.   Yes Historical Provider, MD  metFORMIN (GLUCOPHAGE) 1000 MG tablet Take 1 tablet (1,000 mg total) by mouth 2 (two) times daily with a meal. 03/10/15  Yes Hildred Priest, MD  FLUoxetine (PROZAC) 10 MG capsule Take 1 capsule (10 mg total) by mouth daily. Patient not taking: Reported  on 12/01/2014 11/23/14   Kerrie Buffalo, NP  glimepiride (AMARYL) 4 MG tablet Take 1 tablet (4 mg total) by mouth daily with breakfast. Patient not taking: Reported on 03/28/2015 03/10/15   Hildred Priest, MD  hydrOXYzine (ATARAX/VISTARIL) 50 MG tablet Take 1 tablet (50 mg total) by mouth every 8 (eight) hours as needed. Patient not taking: Reported on 03/28/2015 03/11/15   Hildred Priest, MD  insulin NPH Human (HUMULIN N,NOVOLIN N) 100 UNIT/ML injection Inject 0.08 mLs (8 Units total) into the skin 2 (two) times daily. Patient not taking:  Reported on 01/04/2015 12/02/14   Hampton Abbot, MD  mirtazapine (REMERON) 15 MG tablet Take 1 tablet (15 mg total) by mouth at bedtime. Patient not taking: Reported on 03/28/2015 03/10/15   Hildred Priest, MD  prazosin (MINIPRESS) 1 MG capsule Take 1 capsule (1 mg total) by mouth at bedtime. Patient not taking: Reported on 03/28/2015 03/10/15   Hildred Priest, MD   BP 122/74 mmHg  Pulse 109  Temp(Src) 98.8 F (37.1 C) (Oral)  Resp 18  SpO2 96% Physical Exam  Constitutional: He appears well-developed and well-nourished.  HENT:  Head: Normocephalic and atraumatic.  Eyes: Conjunctivae are normal. Pupils are equal, round, and reactive to light.  Neck: Neck supple. No tracheal deviation present. No thyromegaly present.  Cardiovascular: Regular rhythm.   No murmur heard. Mildly tachycardic  Pulmonary/Chest: Effort normal and breath sounds normal.  Abdominal: Soft. Bowel sounds are normal. He exhibits no distension. There is no tenderness.  Musculoskeletal: Normal range of motion. He exhibits no edema or tenderness.  Right upper extremity swollen minimally reddened and tender at dorsal forearm. No fluctuance. Swelling goes from dorsum of hand to elbow. Full range of motion. Radial pulse 2+. No axillary nodes. All other extremity some redness swelling or tenderness neurovascularly intact  Neurological: He is alert. Coordination normal.  Skin: Skin is warm and dry. No rash noted.  Psychiatric: He has a normal mood and affect.  Nursing note and vitals reviewed.   ED Course  Procedures (including critical care time) Labs Review Labs Reviewed - No data to display  Imaging Review No results found. I have personally reviewed and evaluated these images and lab results as part of my medical decision-making.   EKG Interpretation None      Results for orders placed or performed during the hospital encounter of 03/28/15  CBC with Differential/Platelet  Result Value Ref  Range   WBC 14.4 (H) 4.0 - 10.5 K/uL   RBC 3.44 (L) 4.22 - 5.81 MIL/uL   Hemoglobin 11.9 (L) 13.0 - 17.0 g/dL   HCT 33.0 (L) 39.0 - 52.0 %   MCV 95.9 78.0 - 100.0 fL   MCH 34.6 (H) 26.0 - 34.0 pg   MCHC 36.1 (H) 30.0 - 36.0 g/dL   RDW 13.2 11.5 - 15.5 %   Platelets 82 (L) 150 - 400 K/uL   Neutrophils Relative % 71 %   Neutro Abs 10.2 (H) 1.7 - 7.7 K/uL   Lymphocytes Relative 19 %   Lymphs Abs 2.8 0.7 - 4.0 K/uL   Monocytes Relative 8 %   Monocytes Absolute 1.2 (H) 0.1 - 1.0 K/uL   Eosinophils Relative 2 %   Eosinophils Absolute 0.2 0.0 - 0.7 K/uL   Basophils Relative 0 %   Basophils Absolute 0.1 0.0 - 0.1 K/uL  Comprehensive metabolic panel  Result Value Ref Range   Sodium 124 (L) 135 - 145 mmol/L   Potassium 3.9 3.5 - 5.1 mmol/L  Chloride 89 (L) 101 - 111 mmol/L   CO2 26 22 - 32 mmol/L   Glucose, Bld 603 (HH) 65 - 99 mg/dL   BUN 17 6 - 20 mg/dL   Creatinine, Ser 0.77 0.61 - 1.24 mg/dL   Calcium 8.7 (L) 8.9 - 10.3 mg/dL   Total Protein 6.7 6.5 - 8.1 g/dL   Albumin 3.1 (L) 3.5 - 5.0 g/dL   AST 33 15 - 41 U/L   ALT 36 17 - 63 U/L   Alkaline Phosphatase 157 (H) 38 - 126 U/L   Total Bilirubin 0.7 0.3 - 1.2 mg/dL   GFR calc non Af Amer >60 >60 mL/min   GFR calc Af Amer >60 >60 mL/min   Anion gap 9 5 - 15  POC CBG, ED  Result Value Ref Range   Glucose-Capillary 346 (H) 65 - 99 mg/dL   Comment 1 Document in Chart    No results found.   MDM  Patient is hyperglycemic likely secondary to noncompliance with metformin for several days. Prescription is waiting for him. Also contributing likely as his infection. At 8:25 PM he is requesting to be discharged. I'm in agreement. His blood sugar has come down with fluids and subcutaneous insulin Plan prescription Keflex. Ibuprofen for pain. Follow-up iron [facility]. He's also been referred to [facility] by case management. Hyponatremia and thrombocytopenia is chronic. Suggest repeat exam 2 days Final diagnoses:   None   diagnosis #1 Cellulitis of right arm #2 hyperglycemia #3hyponatremia #4 thrombocytopenia #4 medication non compliance     Orlie Dakin, MD 03/28/15 2033

## 2015-03-28 NOTE — ED Notes (Signed)
Patient noticed edema to right forearm, states that arm became progressively edematous and erythematous since then. Arm is significant edematous from midway down forearm to tips of fingers. 2+ radial pulse present, motor function and sensation intact. Pt states he has taken some advil and the swelling is improved.

## 2015-03-28 NOTE — ED Notes (Signed)
Bed: WA07 Expected date:  Expected time:  Means of arrival:  Comments: EMS- fall/head injury

## 2015-03-28 NOTE — Discharge Instructions (Signed)
Cellulitis Your right arm needs to be reexamined in 2 days. You can go to the [facility], or an urgent care center Take the antibiotic as prescribed. Get your prescription filled for metformin and take it as directed.Marland Kitchen You can also call the [facility] to get a new primary care physician. Take ibuprofen as directed for pain. Avoid alcohol, beer or wine. Return if he develops fever or for worse for any reason. Your blood pressure should be rechecked within 3 weeks. Today's was elevated at 160/73. Cellulitis is an infection of the skin and the tissue beneath it. The infected area is usually red and tender. Cellulitis occurs most often in the arms and lower legs.  CAUSES  Cellulitis is caused by bacteria that enter the skin through cracks or cuts in the skin. The most common types of bacteria that cause cellulitis are staphylococci and streptococci. SIGNS AND SYMPTOMS   Redness and warmth.  Swelling.  Tenderness or pain.  Fever. DIAGNOSIS  Your health care provider can usually determine what is wrong based on a physical exam. Blood tests may also be done. TREATMENT  Treatment usually involves taking an antibiotic medicine. HOME CARE INSTRUCTIONS   Take your antibiotic medicine as directed by your health care provider. Finish the antibiotic even if you start to feel better.  Keep the infected arm or leg elevated to reduce swelling.  Apply a warm cloth to the affected area up to 4 times per day to relieve pain.  Take medicines only as directed by your health care provider.  Keep all follow-up visits as directed by your health care provider. SEEK MEDICAL CARE IF:   You notice red streaks coming from the infected area.  Your red area gets larger or turns dark in color.  Your bone or joint underneath the infected area becomes painful after the skin has healed.  Your infection returns in the same area or another area.  You notice a swollen bump in the infected  area.  You develop new symptoms.  You have a fever. SEEK IMMEDIATE MEDICAL CARE IF:   You feel very sleepy.  You develop vomiting or diarrhea.  You have a general ill feeling (malaise) with muscle aches and pains.   This information is not intended to replace advice given to you by your health care provider. Make sure you discuss any questions you have with your health care provider.   Document Released: 02/03/2005 Document Revised: 01/15/2015 Document Reviewed: 07/12/2011 Elsevier Interactive Patient Education [employer].

## 2015-03-28 NOTE — Progress Notes (Signed)
EDCM spoke to patient at bedside. Patient confirms he does not have a pcp or insurance living in [place].  [facility] provide patient with pamphlet to [facility], informed patient of services there.  EDCM also provided patient with list of pcps who accept self pay patients, list of discount pharmacies and websites needymeds.org and GoodRX.com for medication assistance, phone number to inquire about the orange card, phone number to inquire about [employer], phone number to inquire about the [place], financial resources in the community such as local churches, [employer], urban ministries, and dental assistance for uninsured patients.  Patient thankful for resources.  No further EDCM needs at this time.  Patient reports he is staying with a friend of his.  He reports he has food stamps and knows where to get food.  Patient reports he gets his Metformin for free at [facility].  Patient is familiar with the [facility].  Patient reports he would like to file for disability.  Patient reports he is working part time.  Patient has been seen in the ED 12 times within the last 6 months.  Patient is agreeable for [facility] to send referral to [facility] in attempts to schedule an appointment.  [facility] provided patient with address and phone number to call [facility] as he cannot remember his phone number currently.  Patient reports he would like to be on Lantus insulin.  EDCM explained to patient that he will need a pcp to place him on insulin so that he can be followed and managed properly.  Patient verbalized understanding.  Discussed with EDP.  No further EDCM needs at this time.

## 2015-03-31 ENCOUNTER — Telehealth: Payer: Self-pay

## 2015-03-31 NOTE — Telephone Encounter (Signed)
Message received from Livia Snellen, RN CM requesting an appointment for the patient at  [facility].  Call placed to # [phone] (H) and a HIPAA compliant voice mail message was left requesting a call back to # [phone] or [phone].   Update provided to A. Christen Bame, RN CM.

## 2015-04-01 ENCOUNTER — Telehealth: Payer: Self-pay

## 2015-04-01 NOTE — Telephone Encounter (Signed)
Attempted to contact the patient to check on his status and to discuss scheduling a follow up appointment at [facility]. Call placed to # [phone] (H) and a HIPAA compliant voice mail message was left requesting a call back to # [phone] or [phone].

## 2015-04-07 ENCOUNTER — Telehealth: Payer: Self-pay

## 2015-04-07 NOTE — Telephone Encounter (Signed)
Call placed to the patient to check on his status and to discuss scheduling an appointment in the clinic.  Call placed to # # [phone] (H) and a HIPAA compliant voice mail message was left requesting a call back to # [phone] or [phone].

## 2015-04-20 ENCOUNTER — Emergency Department (HOSPITAL_COMMUNITY)
Admission: EM | Admit: 2015-04-20 | Discharge: 2015-04-21 | Disposition: A | Discharge status: Home / Self Care | Payer: Self-pay | Attending: Emergency Medicine | Admitting: Emergency Medicine

## 2015-04-20 ENCOUNTER — Encounter (HOSPITAL_COMMUNITY): Payer: Self-pay | Admitting: Emergency Medicine

## 2015-04-20 DIAGNOSIS — F329 Major depressive disorder, single episode, unspecified: Secondary | ICD-10-CM | POA: Insufficient documentation

## 2015-04-20 DIAGNOSIS — F10120 Alcohol abuse with intoxication, uncomplicated: Secondary | ICD-10-CM | POA: Insufficient documentation

## 2015-04-20 DIAGNOSIS — F419 Anxiety disorder, unspecified: Secondary | ICD-10-CM | POA: Insufficient documentation

## 2015-04-20 DIAGNOSIS — E119 Type 2 diabetes mellitus without complications: Secondary | ICD-10-CM | POA: Insufficient documentation

## 2015-04-20 DIAGNOSIS — F17210 Nicotine dependence, cigarettes, uncomplicated: Secondary | ICD-10-CM | POA: Insufficient documentation

## 2015-04-20 DIAGNOSIS — Z8719 Personal history of other diseases of the digestive system: Secondary | ICD-10-CM | POA: Insufficient documentation

## 2015-04-20 DIAGNOSIS — Z862 Personal history of diseases of the blood and blood-forming organs and certain disorders involving the immune mechanism: Secondary | ICD-10-CM | POA: Insufficient documentation

## 2015-04-20 DIAGNOSIS — Z8619 Personal history of other infectious and parasitic diseases: Secondary | ICD-10-CM | POA: Insufficient documentation

## 2015-04-20 DIAGNOSIS — F10920 Alcohol use, unspecified with intoxication, uncomplicated: Secondary | ICD-10-CM

## 2015-04-20 DIAGNOSIS — G629 Polyneuropathy, unspecified: Secondary | ICD-10-CM | POA: Insufficient documentation

## 2015-04-20 DIAGNOSIS — Z79899 Other long term (current) drug therapy: Secondary | ICD-10-CM | POA: Insufficient documentation

## 2015-04-20 LAB — BASIC METABOLIC PANEL
Anion gap: 12 (ref 5–15)
BUN: 11 mg/dL (ref 6–20)
CO2: 26 mmol/L (ref 22–32)
Calcium: 8.5 mg/dL — ABNORMAL LOW (ref 8.9–10.3)
Chloride: 94 mmol/L — ABNORMAL LOW (ref 101–111)
Creatinine, Ser: 0.8 mg/dL (ref 0.61–1.24)
GFR calc Af Amer: >60 mL/min (ref >60)
GFR calc non Af Amer: >60 mL/min (ref >60)
Glucose, Bld: 350 mg/dL — ABNORMAL HIGH (ref 65–99)
Potassium: 4.8 mmol/L (ref 3.5–5.1)
Sodium: 132 mmol/L — ABNORMAL LOW (ref 135–145)

## 2015-04-20 LAB — CBG MONITORING, ED: Glucose-Capillary: 344 mg/dL — ABNORMAL HIGH (ref 65–99)

## 2015-04-20 NOTE — ED Notes (Signed)
He phoned EMS d/t "not feeling well" plus ETOH.  He also states he is "diabetic".  He is ambulatory and in no distress.

## 2015-04-20 NOTE — ED Notes (Signed)
Pt states he is here to quit drinking alcohol, states he had five shots of liquor today with last drink 0.5 hours ago, states he usually drinks 6 shots per day.

## 2015-04-20 NOTE — ED Provider Notes (Signed)
CSN: [id number]     Arrival date & time 04/20/15  1509 History   First MD Initiated Contact with Patient 04/20/15 1655     Chief Complaint  Patient presents with  . Alcohol Intoxication   HPI   5 caveat due to alcohol intoxication  59 year old male presents today via EMS for alcohol intoxication requesting detox. Patient on initial evaluation was intoxicated reports that he wanted to stop drinking and denied any specific complaints.    Past Medical History  Diagnosis Date  . Diabetes mellitus   . Chronic hyponatremia     pt denies having   . Alcoholic ([place])   . Alcoholic cirrhosis of liver ([place])     Pt calls this a false diagnosis. Pt denies  . Thrombocytopenia ([place])   . Hyperglycemia   . Depression   . Anxiety   . Alcoholism /alcohol abuse ([place])   . Diabetes mellitus without complication ([place])   . Hepatitis C   . Neuropathy [facility])    Past Surgical History  Procedure Laterality Date  . Plate in lower right leg  2008  . Fracture surgery  right lower leg plate placed years ago   Family History  Problem Relation Age of Onset  . ALS Father    Social History  Substance Use Topics  . Smoking status: Current Every Day Smoker -- 1.00 packs/day for 40 years    Types: Cigarettes  . Smokeless tobacco: None  . Alcohol Use: Yes     Comment: 18/daily    Review of Systems  All other systems reviewed and are negative.   Allergies  Benadryl; Benadryl; Sulfa antibiotics; Tegretol; Librium; and Sulfa antibiotics  Home Medications   Prior to Admission medications   Medication Sig Start Date End Date Taking? Authorizing Provider  gabapentin (NEURONTIN) 300 MG capsule Take 2 capsules (600 mg total) by mouth 3 (three) times daily. 03/10/15  Yes Hildred Priest, MD  ibuprofen (ADVIL,MOTRIN) 200 MG tablet Take 800 mg by mouth 2 (two) times daily as needed for moderate pain.   Yes Historical Provider, MD  metFORMIN (GLUCOPHAGE) 1000 MG tablet Take 1 tablet (1,000 mg  total) by mouth 2 (two) times daily with a meal. 03/10/15  Yes Hildred Priest, MD  cephALEXin (KEFLEX) 500 MG capsule Take 1 capsule (500 mg total) by mouth 4 (four) times daily. Patient not taking: Reported on 04/20/2015 03/28/15   Orlie Dakin, MD  FLUoxetine (PROZAC) 10 MG capsule Take 1 capsule (10 mg total) by mouth daily. Patient not taking: Reported on 12/01/2014 11/23/14   Kerrie Buffalo, NP  glimepiride (AMARYL) 4 MG tablet Take 1 tablet (4 mg total) by mouth daily with breakfast. Patient not taking: Reported on 03/28/2015 03/10/15   Hildred Priest, MD  hydrOXYzine (ATARAX/VISTARIL) 50 MG tablet Take 1 tablet (50 mg total) by mouth every 8 (eight) hours as needed. Patient not taking: Reported on 03/28/2015 03/11/15   Hildred Priest, MD  insulin NPH Human (HUMULIN N,NOVOLIN N) 100 UNIT/ML injection Inject 0.08 mLs (8 Units total) into the skin 2 (two) times daily. Patient not taking: Reported on 01/04/2015 12/02/14   Hampton Abbot, MD  mirtazapine (REMERON) 15 MG tablet Take 1 tablet (15 mg total) by mouth at bedtime. Patient not taking: Reported on 03/28/2015 03/10/15   Hildred Priest, MD  prazosin (MINIPRESS) 1 MG capsule Take 1 capsule (1 mg total) by mouth at bedtime. Patient not taking: Reported on 03/28/2015 03/10/15   Hildred Priest, MD   BP 131/63 mmHg  Pulse  109  Temp(Src) 98.5 F (36.9 C) (Oral)  Resp 18  SpO2 90%   Physical Exam  Constitutional: He is oriented to person, place, and time. He appears well-developed and well-nourished.  HENT:  Head: Normocephalic and atraumatic.  Eyes: Conjunctivae are normal. Pupils are equal, round, and reactive to light. Right eye exhibits no discharge. Left eye exhibits no discharge. No scleral icterus.  Neck: Normal range of motion. No JVD present. No tracheal deviation present.  Pulmonary/Chest: Effort normal. No stridor.  Neurological: He is alert and oriented to person, place,  and time. He has normal strength. No cranial nerve deficit or sensory deficit. Coordination normal. GCS eye subscore is 4. GCS verbal subscore is 5. GCS motor subscore is 6.  Psychiatric: He has a normal mood and affect. His behavior is normal. Judgment and thought content normal.  Nursing note and vitals reviewed.     ED Course  Procedures (including critical care time) Labs Review Labs Reviewed  BASIC METABOLIC PANEL - Abnormal; Notable for the following:    Sodium 132 (*)    Chloride 94 (*)    Glucose, Bld 350 (*)    Calcium 8.5 (*)    All other components within normal limits  CBG MONITORING, ED - Abnormal; Notable for the following:    Glucose-Capillary 344 (*)    All other components within normal limits    Imaging Review No results found. I have personally reviewed and evaluated these images and lab results as part of my medical decision-making.   EKG Interpretation None      MDM   Final diagnoses:  Alcohol intoxication, uncomplicated (HCC)    Labs:  Imaging:  Consults:  Therapeutics:  Discharge Meds:   Assessment/Plan: 59 year old male presents with acute alcohol intoxication. He appeared to be in no acute distress at the time of initial evaluation basic labs drawn which showed no significant findings other than slightly elevated blood sugar, no signs of DKA or hyperosmolar state. Patient was allowed to sleep. The ED, after waking he was given a sandwich several beverages. Patient was clinically sober, in no acute distress, with no active signs of withdrawal. Patient discharged home with follow-up resources for alcohol detoxification and substance abuse, he was given return precautions. Patient verbalized understanding and agreement for today's plan and ambulated out of the ED without any difficulty.         Cap Massi, PA-C 04/21/15 7829  Gareth Morgan, MD 04/21/15 1254

## 2015-04-21 ENCOUNTER — Encounter (HOSPITAL_COMMUNITY): Payer: Self-pay

## 2015-04-21 ENCOUNTER — Observation Stay (HOSPITAL_COMMUNITY)
Admission: AD | Admit: 2015-04-21 | Discharge: 2015-04-24 | Disposition: A | Discharge status: Home / Self Care | Payer: No Typology Code available for payment source | Source: Intra-hospital | Attending: Psychiatry | Admitting: Psychiatry

## 2015-04-21 ENCOUNTER — Emergency Department (HOSPITAL_COMMUNITY)
Admission: EM | Admit: 2015-04-21 | Discharge: 2015-04-21 | Disposition: A | Discharge status: Other Acute Inpatient Hospital | Payer: Self-pay | Attending: Emergency Medicine | Admitting: Emergency Medicine

## 2015-04-21 DIAGNOSIS — Z8719 Personal history of other diseases of the digestive system: Secondary | ICD-10-CM | POA: Insufficient documentation

## 2015-04-21 DIAGNOSIS — Z882 Allergy status to sulfonamides status: Secondary | ICD-10-CM | POA: Insufficient documentation

## 2015-04-21 DIAGNOSIS — F419 Anxiety disorder, unspecified: Secondary | ICD-10-CM | POA: Insufficient documentation

## 2015-04-21 DIAGNOSIS — Z862 Personal history of diseases of the blood and blood-forming organs and certain disorders involving the immune mechanism: Secondary | ICD-10-CM | POA: Insufficient documentation

## 2015-04-21 DIAGNOSIS — E119 Type 2 diabetes mellitus without complications: Secondary | ICD-10-CM | POA: Insufficient documentation

## 2015-04-21 DIAGNOSIS — F10230 Alcohol dependence with withdrawal, uncomplicated: Secondary | ICD-10-CM | POA: Diagnosis present

## 2015-04-21 DIAGNOSIS — F1994 Other psychoactive substance use, unspecified with psychoactive substance-induced mood disorder: Secondary | ICD-10-CM | POA: Diagnosis present

## 2015-04-21 DIAGNOSIS — Z8619 Personal history of other infectious and parasitic diseases: Secondary | ICD-10-CM | POA: Insufficient documentation

## 2015-04-21 DIAGNOSIS — Z888 Allergy status to other drugs, medicaments and biological substances status: Secondary | ICD-10-CM | POA: Insufficient documentation

## 2015-04-21 DIAGNOSIS — G629 Polyneuropathy, unspecified: Secondary | ICD-10-CM | POA: Insufficient documentation

## 2015-04-21 DIAGNOSIS — F1024 Alcohol dependence with alcohol-induced mood disorder: Principal | ICD-10-CM | POA: Insufficient documentation

## 2015-04-21 DIAGNOSIS — F17210 Nicotine dependence, cigarettes, uncomplicated: Secondary | ICD-10-CM | POA: Insufficient documentation

## 2015-04-21 DIAGNOSIS — B1920 Unspecified viral hepatitis C without hepatic coma: Secondary | ICD-10-CM | POA: Insufficient documentation

## 2015-04-21 DIAGNOSIS — F321 Major depressive disorder, single episode, moderate: Secondary | ICD-10-CM

## 2015-04-21 DIAGNOSIS — F10239 Alcohol dependence with withdrawal, unspecified: Secondary | ICD-10-CM | POA: Insufficient documentation

## 2015-04-21 DIAGNOSIS — Z79899 Other long term (current) drug therapy: Secondary | ICD-10-CM | POA: Insufficient documentation

## 2015-04-21 DIAGNOSIS — R45851 Suicidal ideations: Secondary | ICD-10-CM

## 2015-04-21 LAB — COMPREHENSIVE METABOLIC PANEL
ALK PHOS: 109 U/L (ref 38–126)
ALT: 86 U/L — ABNORMAL HIGH (ref 17–63)
ANION GAP: 11 (ref 5–15)
AST: 125 U/L — AB (ref 15–41)
Albumin: 3.3 g/dL — ABNORMAL LOW (ref 3.5–5.0)
BILIRUBIN TOTAL: 1.1 mg/dL (ref 0.3–1.2)
BUN: 15 mg/dL (ref 6–20)
CO2: 26 mmol/L (ref 22–32)
Calcium: 8.2 mg/dL — ABNORMAL LOW (ref 8.9–10.3)
Chloride: 88 mmol/L — ABNORMAL LOW (ref 101–111)
Creatinine, Ser: 0.67 mg/dL (ref 0.61–1.24)
GFR calc Af Amer: >60 mL/min (ref >60)
GFR calc non Af Amer: >60 mL/min (ref >60)
Glucose, Bld: 277 mg/dL — ABNORMAL HIGH (ref 65–99)
POTASSIUM: 4 mmol/L (ref 3.5–5.1)
Sodium: 125 mmol/L — ABNORMAL LOW (ref 135–145)
TOTAL PROTEIN: 7.3 g/dL (ref 6.5–8.1)

## 2015-04-21 LAB — CBC WITH DIFFERENTIAL/PLATELET
BASOS PCT: 1 %
Basophils Absolute: 0.1 10*3/uL (ref 0.0–0.1)
Eosinophils Absolute: 0.1 10*3/uL (ref 0.0–0.7)
Eosinophils Relative: 2 %
HEMATOCRIT: 36.2 % — AB (ref 39.0–52.0)
HEMOGLOBIN: 13.1 g/dL (ref 13.0–17.0)
LYMPHS ABS: 2.4 10*3/uL (ref 0.7–4.0)
LYMPHS PCT: 39 %
MCH: 35.1 pg — AB (ref 26.0–34.0)
MCHC: 36.2 g/dL — AB (ref 30.0–36.0)
MCV: 97.1 fL (ref 78.0–100.0)
MONOS PCT: 7 %
Monocytes Absolute: 0.4 10*3/uL (ref 0.1–1.0)
Neutro Abs: 3.2 10*3/uL (ref 1.7–7.7)
Neutrophils Relative %: 51 %
Platelets: 62 10*3/uL — ABNORMAL LOW (ref 150–400)
RBC: 3.73 MIL/uL — ABNORMAL LOW (ref 4.22–5.81)
RDW: 13.8 % (ref 11.5–15.5)
WBC: 6.2 10*3/uL (ref 4.0–10.5)

## 2015-04-21 LAB — BASIC METABOLIC PANEL
ANION GAP: 10 (ref 5–15)
BUN: 16 mg/dL (ref 6–20)
CHLORIDE: 94 mmol/L — AB (ref 101–111)
CO2: 27 mmol/L (ref 22–32)
Calcium: 8.2 mg/dL — ABNORMAL LOW (ref 8.9–10.3)
Creatinine, Ser: 0.65 mg/dL (ref 0.61–1.24)
GFR calc Af Amer: >60 mL/min (ref >60)
Glucose, Bld: 314 mg/dL — ABNORMAL HIGH (ref 65–99)
POTASSIUM: 5.1 mmol/L (ref 3.5–5.1)
SODIUM: 131 mmol/L — AB (ref 135–145)

## 2015-04-21 LAB — GLUCOSE, CAPILLARY: GLUCOSE-CAPILLARY: 314 mg/dL — AB (ref 65–99)

## 2015-04-21 LAB — CBG MONITORING, ED
GLUCOSE-CAPILLARY: 265 mg/dL — AB (ref 65–99)
GLUCOSE-CAPILLARY: 286 mg/dL — AB (ref 65–99)

## 2015-04-21 LAB — RAPID URINE DRUG SCREEN, HOSP PERFORMED
Amphetamines: NOT DETECTED
BARBITURATES: NOT DETECTED
Benzodiazepines: NOT DETECTED
Cocaine: NOT DETECTED
Opiates: NOT DETECTED
TETRAHYDROCANNABINOL: NOT DETECTED

## 2015-04-21 LAB — ETHANOL: ALCOHOL ETHYL (B): 97 mg/dL — AB (ref <5)

## 2015-04-21 MED ORDER — LORAZEPAM 1 MG PO TABS
1.0000 mg | ORAL_TABLET | Freq: Three times a day (TID) | ORAL | Status: AC
  Administered 2015-04-23 (×3): 1 mg via ORAL
  Filled 2015-04-21 (×3): qty 1

## 2015-04-21 MED ORDER — LORAZEPAM 1 MG PO TABS: 0.0000 mg | ORAL_TABLET | Freq: Four times a day (QID) | ORAL | Status: DC

## 2015-04-21 MED ORDER — VITAMIN B-1 100 MG PO TABS: 100.0000 mg | ORAL_TABLET | Freq: Every day | ORAL | Status: DC

## 2015-04-21 MED ORDER — LORAZEPAM 1 MG PO TABS
1.0000 mg | ORAL_TABLET | Freq: Four times a day (QID) | ORAL | Status: AC
  Administered 2015-04-21 – 2015-04-22 (×5): 1 mg via ORAL
  Filled 2015-04-21 (×5): qty 1

## 2015-04-21 MED ORDER — LORAZEPAM 1 MG PO TABS
1.0000 mg | ORAL_TABLET | Freq: Two times a day (BID) | ORAL | Status: DC
  Administered 2015-04-24: 1 mg via ORAL
  Filled 2015-04-21: qty 1

## 2015-04-21 MED ORDER — THIAMINE HCL 100 MG/ML IJ SOLN
100.0000 mg | Freq: Every day | INTRAMUSCULAR | Status: DC
Start: 2015-04-21 — End: 2015-04-21

## 2015-04-21 MED ORDER — VITAMIN B-1 100 MG PO TABS
100.0000 mg | ORAL_TABLET | Freq: Every day | ORAL | Status: DC
  Administered 2015-04-21: 100 mg via ORAL
  Filled 2015-04-21: qty 1

## 2015-04-21 MED ORDER — LORAZEPAM 2 MG/ML IJ SOLN: 0.0000 mg | Freq: Four times a day (QID) | INTRAMUSCULAR | Status: DC

## 2015-04-21 MED ORDER — MAGNESIUM HYDROXIDE 400 MG/5ML PO SUSP: 30.0000 mL | Freq: Every day | ORAL | Status: DC | PRN

## 2015-04-21 MED ORDER — LORAZEPAM 1 MG PO TABS: 1.0000 mg | ORAL_TABLET | Freq: Every day | ORAL | Status: DC

## 2015-04-21 MED ORDER — LOPERAMIDE HCL 2 MG PO CAPS
2.0000 mg | ORAL_CAPSULE | ORAL | Status: DC | PRN
  Administered 2015-04-23: 4 mg via ORAL
  Filled 2015-04-21: qty 2

## 2015-04-21 MED ORDER — ALUM & MAG HYDROXIDE-SIMETH 200-200-20 MG/5ML PO SUSP: 30.0000 mL | ORAL | Status: DC | PRN

## 2015-04-21 MED ORDER — METFORMIN HCL 500 MG PO TABS
1000.0000 mg | ORAL_TABLET | Freq: Two times a day (BID) | ORAL | Status: DC
  Administered 2015-04-22 – 2015-04-24 (×5): 1000 mg via ORAL
  Filled 2015-04-21 (×2): qty 2
  Filled 2015-04-21: qty 28
  Filled 2015-04-21 (×3): qty 2

## 2015-04-21 MED ORDER — INSULIN ASPART 100 UNIT/ML ~~LOC~~ SOLN
0.0000 [IU] | Freq: Three times a day (TID) | SUBCUTANEOUS | Status: DC
  Administered 2015-04-22: 5 [IU] via SUBCUTANEOUS
  Administered 2015-04-22: 6 [IU] via SUBCUTANEOUS
  Administered 2015-04-23: 8 [IU] via SUBCUTANEOUS
  Administered 2015-04-23: 11 [IU] via SUBCUTANEOUS
  Administered 2015-04-23 – 2015-04-24 (×2): 5 [IU] via SUBCUTANEOUS
  Administered 2015-04-24: 2 [IU] via SUBCUTANEOUS

## 2015-04-21 MED ORDER — THIAMINE HCL 100 MG/ML IJ SOLN: 100.0000 mg | Freq: Every day | INTRAMUSCULAR | Status: DC

## 2015-04-21 MED ORDER — SODIUM CHLORIDE 0.9 % IV BOLUS (SEPSIS)
1000.0000 mL | Freq: Once | INTRAVENOUS | Status: AC
  Administered 2015-04-21: 1000 mL via INTRAVENOUS

## 2015-04-21 MED ORDER — LORAZEPAM 1 MG PO TABS: 0.0000 mg | ORAL_TABLET | Freq: Two times a day (BID) | ORAL | Status: DC

## 2015-04-21 MED ORDER — ACETAMINOPHEN 325 MG PO TABS: 650.0000 mg | ORAL_TABLET | Freq: Four times a day (QID) | ORAL | Status: DC | PRN

## 2015-04-21 MED ORDER — HYDROXYZINE HCL 25 MG PO TABS
25.0000 mg | ORAL_TABLET | Freq: Four times a day (QID) | ORAL | Status: DC | PRN
  Filled 2015-04-21: qty 10

## 2015-04-21 MED ORDER — INSULIN ASPART 100 UNIT/ML ~~LOC~~ SOLN
4.0000 [IU] | Freq: Three times a day (TID) | SUBCUTANEOUS | Status: DC
  Administered 2015-04-22: 4 [IU] via SUBCUTANEOUS

## 2015-04-21 MED ORDER — LORAZEPAM 1 MG PO TABS
0.0000 mg | ORAL_TABLET | Freq: Two times a day (BID) | ORAL | Status: DC
  Filled 2015-04-21: qty 1

## 2015-04-21 MED ORDER — ONDANSETRON 4 MG PO TBDP: 4.0000 mg | ORAL_TABLET | Freq: Four times a day (QID) | ORAL | Status: DC | PRN

## 2015-04-21 MED ORDER — INSULIN ASPART 100 UNIT/ML ~~LOC~~ SOLN
0.0000 [IU] | Freq: Every day | SUBCUTANEOUS | Status: DC
  Administered 2015-04-21: 4 [IU] via SUBCUTANEOUS
  Administered 2015-04-22: 2 [IU] via SUBCUTANEOUS

## 2015-04-21 MED ORDER — ADULT MULTIVITAMIN W/MINERALS CH
1.0000 | ORAL_TABLET | Freq: Every day | ORAL | Status: DC
  Administered 2015-04-21 – 2015-04-24 (×4): 1 via ORAL
  Filled 2015-04-21 (×4): qty 1

## 2015-04-21 MED ORDER — LORAZEPAM 1 MG PO TABS
0.0000 mg | ORAL_TABLET | Freq: Four times a day (QID) | ORAL | Status: DC
  Administered 2015-04-21: 1 mg via ORAL
  Filled 2015-04-21: qty 1

## 2015-04-21 MED ORDER — LORAZEPAM 1 MG PO TABS
1.0000 mg | ORAL_TABLET | Freq: Four times a day (QID) | ORAL | Status: DC | PRN
  Administered 2015-04-22 – 2015-04-24 (×4): 1 mg via ORAL
  Filled 2015-04-21 (×4): qty 1

## 2015-04-21 MED ORDER — LORAZEPAM 2 MG/ML IJ SOLN: 0.0000 mg | Freq: Two times a day (BID) | INTRAMUSCULAR | Status: DC

## 2015-04-21 MED ORDER — METFORMIN HCL 500 MG PO TABS
1000.0000 mg | ORAL_TABLET | Freq: Two times a day (BID) | ORAL | Status: DC
  Administered 2015-04-21: 1000 mg via ORAL
  Filled 2015-04-21 (×3): qty 2

## 2015-04-21 MED ORDER — TRAZODONE HCL 50 MG PO TABS
50.0000 mg | ORAL_TABLET | Freq: Every evening | ORAL | Status: DC | PRN
  Filled 2015-04-21: qty 7

## 2015-04-21 MED ORDER — ONDANSETRON HCL 4 MG PO TABS
4.0000 mg | ORAL_TABLET | Freq: Once | ORAL | Status: AC
  Administered 2015-04-21: 4 mg via ORAL
  Filled 2015-04-21: qty 1

## 2015-04-21 MED ORDER — VITAMIN B-1 100 MG PO TABS
100.0000 mg | ORAL_TABLET | Freq: Every day | ORAL | Status: DC
  Administered 2015-04-22 – 2015-04-24 (×3): 100 mg via ORAL
  Filled 2015-04-21 (×3): qty 1

## 2015-04-21 NOTE — Discharge Instructions (Signed)
Emergency Department Resource Guide 1) Find a Doctor and Pay Out of Pocket Although you won't have to find out who is covered by your insurance plan, it is a good idea to ask around and get recommendations. You will then need to call the office and see if the doctor you have chosen will accept you as a new patient and what types of options they offer for patients who are self-pay. Some doctors offer discounts or will set up payment plans for their patients who do not have insurance, but you will need to ask so you aren't surprised when you get to your appointment.  2) Contact Your Local Health Department Not all health departments have doctors that can see patients for sick visits, but many do, so it is worth a call to see if yours does. If you don't know where your local health department is, you can check in your phone book. The CDC also has a tool to help you locate your state's health department, and many state websites also have listings of all of their local health departments.  3) Find a [facility] If your illness is not likely to be very severe or complicated, you may want to try a walk in clinic. These are popping up all over the country in pharmacies, drugstores, and shopping centers. They're usually staffed by nurse practitioners or physician assistants that have been trained to treat common illnesses and complaints. They're usually fairly quick and inexpensive. However, if you have serious medical issues or chronic medical problems, these are probably not your best option.  No Primary Care Doctor: - Call Health Connect at  [phone] - they can help you locate a primary care doctor that  accepts your insurance, provides certain services, etc. - Physician Referral Service- [phone]  Chronic Pain Problems: Organization         Address  Phone   Notes  [facility]  [phone] Patients need to be referred by their primary care doctor.   Medication  Assistance: Organization         Address  Phone   Notes  [facility] Medication [facility] [address] [place], [place] [address] [phone] --Must be a resident of [place] [facility] -- Must have NO insurance coverage whatsoever (no Medicaid/ Medicare, etc.) -- The pt. MUST have a primary care doctor that directs their care regularly and follows them in the community   [facility]  [phone]   [employer]  [phone]    Agencies that provide inexpensive medical care: Organization         Address  Phone   Notes  [place]  [phone]   [facility]    [phone]   [facility] [place], [place] [address] [phone]   [place] [address] [place] [phone]   Planned Parenthood    [phone]   [facility]    [phone]   [place] and [place] [address], [place] Phone:  [phone], Fax:  [phone] Hours of Operation:  9 am - 6 pm, M-F.  Also accepts Medicaid/Medicare and self-pay.  [facility] for [place], [address],  Phone: [phone], Fax: [phone]. Hours of Operation:  8:30 am - 5:30 pm, M-F.  Also accepts Medicaid and self-pay.  [facility] [address], [place] Phone: [phone]   [place], [place], [place] [phone], Ext. [phone] Mondays & Thursdays: 7-9 AM.  First 15 patients are seen on a first come, first serve basis.    [place] Providers:  Organization         Address  Phone   Notes  [facility] [address], [place] [phone] Also accepts self-pay patients.  [facility] [address], [place]  [phone]   [place], [address], [place]  [phone]   [facility] [address], [place] [phone]   Lucianne Lei [address], [place]   [phone] Only accepts [place] Access [place] patients after they have their name applied to their card.   Self-Pay (no insurance) in [facility]:  Organization         Address  Phone   Notes  Sickle Cell Patients, [facility] Internal Medicine [place] [phone]   [facility] [place] [phone]   [facility] [place]  [place], [place], [place] [phone]   [facility]/Dr. Osei-Bonsu  [address] [place] or [address], [place] [phone] Phone number for both [place] and [place] locations is the same.  [facility]gent [facility] and [facility] [address] [place] [phone]   [facility] [address] [place] or [address] [phone]   [facility] [address], [place] [phone], phone; [phone], fax Sees patients 1st and 3rd Saturday of every month.  Must not qualify for public or private insurance (i.e. Medicaid, Medicare, [place] Health Choice, Veterans' Benefits)  Household income should be no more than 200% of the poverty level The clinic cannot treat you if you are pregnant or think you are pregnant  Sexually transmitted diseases are not treated at the clinic.    Dental Care: Organization         Address  Phone  Notes  [place] [facility] [place] [phone] Accepts children up to age 4 who are enrolled in [place] or [place]; pregnant women with a Medicaid card; and children who have applied for Medicaid or [place] Health Choice, but were declined, whose parents can pay a reduced fee at time of service.  [facility]  Department of [facility]  [address], [place] [phone] Accepts children up to age 35 who are enrolled in [place] or [place]; pregnant women with a Medicaid card; and children who have applied for Medicaid or [place] Health Choice, but were declined, whose parents can pay a reduced fee at time of service.  [place] Adult [facility] Access PROGRAM  [place] [phone] Patients are seen by appointment only. Walk-ins are not accepted. [place] will see patients 43 years of age and older. Monday - Tuesday (8am-5pm) Most Wednesdays (8:30-5pm) $30 per visit, cash only  [facility] Adult Dental Access PROGRAM  [address], [facility] [phone] Patients are seen by appointment only. Walk-ins are not accepted. [place] will see patients 40 years of age and older. One  Wednesday Evening (Monthly: Volunteer Based).  $30 per visit, cash only  [place]  [phone] for adults; Children under age 36, call Graduate Pediatric Dentistry at [phone]. Children aged 5-14, please call [phone] to request a pediatric application.  Dental services are provided in all areas of dental care including fillings, crowns and bridges, complete and partial dentures, implants, gum treatment, root canals, and extractions. Preventive care is also provided. Treatment is provided to both adults and children. Patients are selected via a lottery and there is often a waiting list.   [facility] [address] [place]  [phone] [email or web address]   [facility] [address] [place], [place] [phone], Ext. [phone] Second and Fourth Thursday of each month, opens at 6:30 AM; Clinic ends at 9 AM.  Patients are seen on a first-come first-served basis, and a limited number are seen during each clinic.   [facility]  [address] Hillard Danker [place], [place] [phone]    Eligibility Requirements You must have lived in [place], [place], or [place] counties for at least the last three months.   You cannot be eligible for state or federal sponsored [employer], including [employer], [place], or [employer].   You generally cannot be eligible for healthcare insurance through your employer.    How to apply: Eligibility screenings are held every Tuesday and Wednesday afternoon from 1:00 pm until 4:00 pm. You do not need an appointment for the interview!  [facility] [address], [place], [place]   [place]  [place] Department  [place]  [phone]    Behavioral Health Resources in the Community: Intensive Outpatient Programs Organization         Address  Phone  Notes  [place]. [address] [place], [place] [phone]   [facility] Outpatient [address], [place], [place]   ADS: Alcohol & Drug Svcs [address], [place], [place]   [place] [address],  [place], [place] or [phone]   Substance Abuse Resources Organization         Address  Phone  Notes  Alcohol and Drug Services  [phone]   [place]  [phone]   The [place]   Chinita Pester  [phone]   Residential & Outpatient Substance Abuse Program  [phone]   Psychological Services Organization         Address  Phone  Notes  [facility] [place]  [place]  [phone]   [place] [address] [place] or [phone]    Mobile Crisis Teams Organization         Address  Phone  Notes  [facility], Mobile Crisis Care Unit  [phone]   [facility]  [address]  [place], [place]   [place] [address] [place] [phone]    Self-Help/Support Groups Organization         Address  Phone             Notes  [place]. of [place] - variety of support groups  [place] Call for more information  Narcotics Anonymous (NA), [facility] [address], [employer] [place]  2 meetings at this location  Residential Treatment Programs Organization         Address  Phone  Notes  [facility] [address],    [place]  [phone]   [facility]  [address], [place] 732202, [place], [place]   [place], [place] [phone] Admissions: 8am-3pm M-F  Incentives Substance [place] [address],    [place], [place] [phone]   [facility] [address] [place], [place], [place]   [facility] [address]  [place], [place]   [facility] - Intensive Outpatient [address] [place] [address], [place], [place]   [facility] [place].,  [place], [place] [phone] or [phone]   [facility] Services (RTS) [address] [place], [place] Accepts Medicaid  [facility] [place] [address]  [place] 1-9157716171 Substance Abuse/Addiction Treatment   [facility] Organization         Address  Phone  Notes  [facility]  [phone]   Domenic Schwab, PhD [address] Arlis [place], [place]   [phone] or [phone]   [place], [place] [phone]   [facility] [address] [place], [place] [phone] Insurance/Medicaid/sponsorship through [facility] and Families [address], [address] [place]                                    [place], [place] [phone], [place] [phone]    Dr. Adele Schilder  [phone]   [facility] [place] Dept. 1) [address], [place] 2) [place] 3)  [place] [address], [place] [phone]  [phone]   [place] [phone] or [phone] (After Hours)     Alcohol Intoxication Alcohol intoxication occurs when you drink enough alcohol that it affects your ability to function. It can be mild or very severe. Drinking a lot of alcohol in a short time is called binge drinking. This can be very harmful. Drinking alcohol can also be more dangerous if you are taking medicines or other drugs. Some of the effects caused by alcohol may include:  Loss of coordination.  Changes in mood and behavior.  Unclear thinking.  Trouble talking (slurred speech).  Throwing up (vomiting).  Confusion.  Slowed breathing.  Twitching and shaking (seizures).  Loss of consciousness. HOME CARE  Do not drive after drinking alcohol.  Drink enough water and fluids to keep your pee (urine) clear or pale yellow. Avoid caffeine.  Only take medicine as told by your doctor. GET HELP IF:  You throw up (vomit) many times.  You do not feel better after a few days.  You frequently have alcohol intoxication. Your doctor can help decide if you should see a substance use treatment counselor. GET HELP RIGHT AWAY IF:  You become shaky when you stop drinking.  You have twitching and shaking.  You throw up blood. It may look bright red or like coffee grounds.  You notice blood in your poop (bowel movements).  You become lightheaded or pass out (faint). MAKE SURE YOU:   Understand  these instructions.  Will watch your condition.  Will get help right away if you are not doing well or get worse.   This information is not intended to replace advice given to you by your  health care provider. Make sure you discuss any questions you have with your health care provider.   Document Released: 10/13/2007 Document Revised: 12/27/2012 Document Reviewed: 09/29/2012 Elsevier Interactive Patient Education [employer].

## 2015-04-21 NOTE — Progress Notes (Signed)
Patient ID: [name], male   DOB: [birth year], 59 y.o.   MRN: [id number] Patient admitted to observation for detox and hearing voices. Stated he also sees "black clouds with eyes".  Stated he has been drinking 5 or more 40 oz beers a day.  Michela Pitcher he was clean for three months and started drinking when he got kicked out of [employer] "for no reason".  States he has felt suicidal for several days but denies he has a plan and states"I really don't want to kill myself. Patient admitted with a CIWA of 10. Reporting diarrhea, nausea, stomach cramps. Patient oriented to unit and offered nourishment. Patient is diabetic. Discussed need for orders for CBG's and insulin with Mickel Baas NP.

## 2015-04-21 NOTE — Progress Notes (Signed)
Pt asleep at shift change,  Awake at 21:00.  Cooperative, alert, verbal and seeking his ativan. CBG taken with BS of 314 at 21:40.  Pt states he is hungry and thirsty.  Sts did not eat previous meal. Sts feels better. 4 units insulin given to left under arm area.  Sugar fee snacks offered.  Scheduled dose of ativan given. Pt watching football game and responding to game plays verbally. Continue to monitor for safety.

## 2015-04-21 NOTE — BH Assessment (Signed)
[name] Boga, DNP accepted to [facility] observation unit at [facility]. The provider for the observation unit is May Agustin, NP. Patient assigned to chair #5. Nursing report [phone]. Support paperwork completed. Patient to be transferred to [facility] via [facility].
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
## 2015-04-21 NOTE — ED Notes (Signed)
Pt given sandwich and beverage.  Pt sat up in bed and asked for beverage.

## 2015-04-21 NOTE — ED Notes (Signed)
Awake. Verbally responsive. Resp even and unlabored. ABC's intact. No behavior problems noted. NAD noted. Sitter at bedside. Pt ambulated to BR with steady gait.

## 2015-04-21 NOTE — ED Notes (Addendum)
Awake. Verbally responsive. Resp even and unlabored. ABC's intact. No behavior problems noted. Pt reported hearing voices to tell him to kill himself but pt denies plan and seeing little black clouds shape like demons. Pt reported being depressed around Christmas.

## 2015-04-21 NOTE — ED Notes (Signed)
Awake. Verbally responsive. A/O x4. Resp even and unlabored. No audible adventitious breath sounds noted. ABC's intact.

## 2015-04-21 NOTE — ED Notes (Signed)
Called to arrange for transport to [facility] with [facility]. ETA at 1600.

## 2015-04-21 NOTE — ED Notes (Signed)
Friend's contact number: [phone]

## 2015-04-21 NOTE — Plan of Care (Signed)
[facility] Observation Crisis Plan  Reason for Crisis Plan:  Crisis Stabilization and Substance Abuse   Plan of Care:  Referral for Substance Abuse  Family Support:      Current Living Environment:  Living Arrangements: Other (Comment), Non-relatives/Friends  Insurance:   Hospital Account    Name Acct ID Class Status Primary Coverage   [name], [name] [id number] [place] 3-WAY        Guarantor Account (for Hospital Account [id number])    Name Relation to Pt Service Area Active? Acct Type   [name], [name]   Address Phone       [address] [place], [place] [address] [phone])          Coverage Information (for Hospital Account [id number])    F/O Payor/Plan Precert #   [employer] 3-WAY/[employer] 3-WAY    Subscriber Subscriber #   [name], [name] [id number]   Address Phone   [address] [place],  [address] [phone]      Legal Guardian:     Primary Care Provider:  Pcp Not In System  Current Outpatient Providers:  none  Psychiatrist:     Counselor/Therapist:     Compliant with Medications:  No  Additional Information:   [name] 12/12/20166:01 PM

## 2015-04-21 NOTE — ED Provider Notes (Signed)
CSN: [id number]     Arrival date & time 04/21/15  7741 History   First MD Initiated Contact with Patient 04/21/15 0730     Chief Complaint  Patient presents with  . Hallucinations     The history is provided by the patient. No language interpreter was used.   [name] is a 59 y.o. male who presents to the Emergency Department complaining of SI, hallucinations.  He has a hx/o EtOH abuse and presents today for alcohol withdrawal and SI.  He is wanting to stop drinking, currently drinks 5 or more 40 ounce beers daily.  He has been drinking heavily for the last month, since he was last discharged from rehab.  He was seen in the ED and discharged yesterday for alcohol intoxication.  He returns today due to continued desire for detox as well as auditory hallucinations that are telling him to kill himself . He has a hx/o similar episode once previously.  He is diabetic and takes metformin, last dose was yesterday morning.    Past Medical History  Diagnosis Date  . Diabetes mellitus   . Chronic hyponatremia     pt denies having   . Alcoholic ([place])   . Alcoholic cirrhosis of liver ([place])     Pt calls this a false diagnosis. Pt denies  . Thrombocytopenia ([place])   . Hyperglycemia   . Depression   . Anxiety   . Alcoholism /alcohol abuse ([place])   . Diabetes mellitus without complication ([place])   . Hepatitis C   . Neuropathy [facility])    Past Surgical History  Procedure Laterality Date  . Plate in lower right leg  2008  . Fracture surgery  right lower leg plate placed years ago   Family History  Problem Relation Age of Onset  . ALS Father    Social History  Substance Use Topics  . Smoking status: Current Every Day Smoker -- 1.00 packs/day for 40 years    Types: Cigarettes  . Smokeless tobacco: None  . Alcohol Use: Yes     Comment: 18/daily    Review of Systems  All other systems reviewed and are negative.     Allergies  Benadryl; Benadryl; Sulfa antibiotics; Tegretol;  Librium; and Sulfa antibiotics  Home Medications   Prior to Admission medications   Medication Sig Start Date End Date Taking? Authorizing Provider  gabapentin (NEURONTIN) 300 MG capsule Take 2 capsules (600 mg total) by mouth 3 (three) times daily. 03/10/15  Yes Hildred Priest, MD  ibuprofen (ADVIL,MOTRIN) 200 MG tablet Take 800 mg by mouth 2 (two) times daily as needed for moderate pain.   Yes Historical Provider, MD  metFORMIN (GLUCOPHAGE) 1000 MG tablet Take 1 tablet (1,000 mg total) by mouth 2 (two) times daily with a meal. 03/10/15  Yes Hildred Priest, MD  cephALEXin (KEFLEX) 500 MG capsule Take 1 capsule (500 mg total) by mouth 4 (four) times daily. Patient not taking: Reported on 04/20/2015 03/28/15   Orlie Dakin, MD  FLUoxetine (PROZAC) 10 MG capsule Take 1 capsule (10 mg total) by mouth daily. Patient not taking: Reported on 12/01/2014 11/23/14   Kerrie Buffalo, NP  glimepiride (AMARYL) 4 MG tablet Take 1 tablet (4 mg total) by mouth daily with breakfast. Patient not taking: Reported on 03/28/2015 03/10/15   Hildred Priest, MD  hydrOXYzine (ATARAX/VISTARIL) 50 MG tablet Take 1 tablet (50 mg total) by mouth every 8 (eight) hours as needed. Patient not taking: Reported on 03/28/2015 03/11/15   Seth Bake  Hernandez-Gonzalez, MD  insulin NPH Human (HUMULIN N,NOVOLIN N) 100 UNIT/ML injection Inject 0.08 mLs (8 Units total) into the skin 2 (two) times daily. Patient not taking: Reported on 01/04/2015 12/02/14   Hampton Abbot, MD  mirtazapine (REMERON) 15 MG tablet Take 1 tablet (15 mg total) by mouth at bedtime. Patient not taking: Reported on 03/28/2015 03/10/15   Hildred Priest, MD  prazosin (MINIPRESS) 1 MG capsule Take 1 capsule (1 mg total) by mouth at bedtime. Patient not taking: Reported on 03/28/2015 03/10/15   Hildred Priest, MD   BP 171/85 mmHg  Pulse 99  Temp(Src) 97.6 F (36.4 C)  Resp 18  SpO2 100% Physical Exam   Constitutional: He is oriented to person, place, and time. He appears well-developed and well-nourished.  HENT:  Head: Normocephalic and atraumatic.  Cardiovascular: Normal rate and regular rhythm.   No murmur heard. Pulmonary/Chest: Effort normal and breath sounds normal. No respiratory distress.  Abdominal: Soft. There is no tenderness. There is no rebound and no guarding.  Musculoskeletal: He exhibits no edema or tenderness.  Neurological: He is alert and oriented to person, place, and time.  Skin: Skin is warm and dry.  Psychiatric:  Flat affect  Nursing note and vitals reviewed.   ED Course  Procedures (including critical care time) Labs Review Labs Reviewed  COMPREHENSIVE METABOLIC PANEL - Abnormal; Notable for the following:    Sodium 125 (*)    Chloride 88 (*)    Glucose, Bld 277 (*)    Calcium 8.2 (*)    Albumin 3.3 (*)    AST 125 (*)    ALT 86 (*)    All other components within normal limits  CBC WITH DIFFERENTIAL/PLATELET - Abnormal; Notable for the following:    RBC 3.73 (*)    HCT 36.2 (*)    MCH 35.1 (*)    MCHC 36.2 (*)    Platelets 62 (*)    All other components within normal limits  ETHANOL - Abnormal; Notable for the following:    Alcohol, Ethyl (B) 97 (*)    All other components within normal limits  BASIC METABOLIC PANEL - Abnormal; Notable for the following:    Sodium 131 (*)    Chloride 94 (*)    Glucose, Bld 314 (*)    Calcium 8.2 (*)    All other components within normal limits  CBG MONITORING, ED - Abnormal; Notable for the following:    Glucose-Capillary 265 (*)    All other components within normal limits  CBG MONITORING, ED - Abnormal; Notable for the following:    Glucose-Capillary 286 (*)    All other components within normal limits  URINE RAPID DRUG SCREEN, HOSP PERFORMED    Imaging Review No results found. I have personally reviewed and evaluated these images and lab results as part of my medical decision-making.   EKG  Interpretation None      MDM   Final diagnoses:  Alcohol dependence with uncomplicated withdrawal (HCC)  Suicidal ideation  Major depressive disorder, single episode, moderate ([place])   Patient with history of alcohol dependence here with SI and auditory hallucinations. Patient with hyponatremia that improved after IV fluid hydration. He is hyperglycemic but did not take his metformin today. He is medically cleared for psychiatric treatment.     Quintella Reichert, MD 04/21/15 [phone]
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
## 2015-04-21 NOTE — Progress Notes (Signed)
CSW was consulted by Nurse CM to speak with patient regarding shelter issues.   CSW me with patient at bedside. Patient states that he currently lives in a home with [place]. Patient states that he has roommates. Patient informed CSW that the home does not have any heat or hot water at this time. Patient states that they have been using a wood stove in the home for heat. He says that they do not have any power.  CSW provided the patient with information for shelter, food pantries, and IRC in order to obtain future placement. The patient will be admitted to [facility].  Willette Brace [phone] ED CSW 04/21/2015 6:16 PM
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
## 2015-04-21 NOTE — ED Notes (Signed)
Dr Randal Buba and NP Baker Janus made aware of patient condition. Nothing ordered at this time.

## 2015-04-21 NOTE — Progress Notes (Signed)
Patient accepted to OBS bed #5 at [facility], RN

## 2015-04-21 NOTE — Progress Notes (Signed)
[place] INPATIENT:  Family/Significant Other Suicide Prevention Education  Suicide Prevention Education:  Patient Refusal for Family/Significant Other Suicide Prevention Education: The patient [name] has refused to provide written consent for family/significant other to be provided Family/Significant Other Suicide Prevention Education during admission and/or prior to discharge.  Physician notified.  [name] 04/21/2015, 6:01 PM

## 2015-04-21 NOTE — ED Notes (Addendum)
Pt reports seeing evil spirits around him and hearing voices that tell him to "take himself out." States that he doesn't want to do that but he is scared. Pt states he does not want to kill himself and has no plans to. Pt was discharged hours ago. Also complaining of a lump from a spider bite a month ago, poison going through his body, and severe alcohol withdrawls.

## 2015-04-21 NOTE — ED Notes (Signed)
MD at bedside.

## 2015-04-21 NOTE — ED Notes (Signed)
Pt sleeping at this time. He appears comfortable and in no distress with regular, unlabored respirations. Will assess vital signs when he wakes up.

## 2015-04-21 NOTE — ED Notes (Signed)
Pt placed in [place] scrubs. Security at bedside wanded pt and personal items. Pt personal items consist: belt, lighter, jacket, shirt, jeans, wallet (locked with security) and 1 cell phone (locke with security). Pt's clothing at [place].

## 2015-04-22 DIAGNOSIS — F10230 Alcohol dependence with withdrawal, uncomplicated: Secondary | ICD-10-CM | POA: Diagnosis not present

## 2015-04-22 LAB — GLUCOSE, CAPILLARY
GLUCOSE-CAPILLARY: 168 mg/dL — AB (ref 65–99)
GLUCOSE-CAPILLARY: 237 mg/dL — AB (ref 65–99)
Glucose-Capillary: 225 mg/dL — ABNORMAL HIGH (ref 65–99)
Glucose-Capillary: 238 mg/dL — ABNORMAL HIGH (ref 65–99)

## 2015-04-22 MED ORDER — INSULIN ASPART 100 UNIT/ML ~~LOC~~ SOLN
3.0000 [IU] | Freq: Three times a day (TID) | SUBCUTANEOUS | Status: DC
  Administered 2015-04-22 – 2015-04-24 (×6): 3 [IU] via SUBCUTANEOUS

## 2015-04-22 MED ORDER — GABAPENTIN 600 MG PO TABS
600.0000 mg | ORAL_TABLET | Freq: Three times a day (TID) | ORAL | Status: DC
Start: 2015-04-22 — End: 2015-04-24
  Administered 2015-04-22 – 2015-04-24 (×5): 600 mg via ORAL
  Filled 2015-04-22: qty 1
  Filled 2015-04-22: qty 21
  Filled 2015-04-22 (×4): qty 1

## 2015-04-22 NOTE — Progress Notes (Addendum)
Patient has continued to complain of multiple withdrawal symptoms during the day. He has demonstrated poor appetite and refused his lunch time insulin. His CIWA was nine this morning with the most recent at 1600 increasing to 11. Patient stated this afternoon "I don't feel stable mentally. If I leave right now I would go back to drinking and then I don't know what would happen. I do not feel well at all physically. I'm just not ready to leave. I hope to feel better tomorrow. I was drinking pretty heavy because I was staying with these men who drink all the time." Patient's blood pressure is mildly elevated. The patient can be reevaluated in the morning for further disposition, will continue ativan taper at this time. Reviewed recent labs which revealed a sodium of 131 which had increased from 125 earlier in his hospital course. Will order a complete metabolic panel to evaluate current liver function and electrolytes.

## 2015-04-22 NOTE — Progress Notes (Signed)
Nursing Progress Note:  Patient's CBG reading is 168; patient has had one bite of food and states "I just can't, my stomach still hurtin'. Patient also stating he is nauseous. Nurse discussing with patient his glucose reading and ordered amount of corresponding insulin he is ordered to receive. Patient stating "I could bottom out" and stating "let;s hold off on that for now". Nurse holding all insulin scheduled for lunchtime for now. Will let Provider know.

## 2015-04-22 NOTE — Progress Notes (Signed)
Nursing Shift Assessment:  Patient is dozing but awakens fairly easy for assessment and conversation with Nurse. Patient denies any current SI/HI/AVH but states that the SI "comes and goes".  Nurse providing medications as prescribed, emotional support and therapeutic communication, ensuring constant observation except when patient in the bathroom. Patient remains safe on Unit, verbally contracts for safety in that he agrees to alert staff should he develop any SI and plan to harm self before doing so.

## 2015-04-22 NOTE — H&P (Signed)
[facility] Admission Assessment Adult  Patient Identification: [name] MRN:  [id number] Date of Evaluation:  04/22/2015 Chief Complaint:  Substance Induced Mood disorder "I had made at 3 months without drinking." Principal Diagnosis: Alcohol use disorder, severe Diagnosis:  Alcohol use disorder, severe, substance-induced mood disorder Patient Active Problem List   Diagnosis Date Noted  . Moderate major depression ([place]) [F32.1] 04/21/2015  . Major depressive disorder, single episode, moderate ([place]) [F32.1] 03/10/2015  . Alcohol dependence with uncomplicated withdrawal ([place]) [F10.230] 03/03/2015  . Alcohol abuse [F10.10]   . Suicidal ideation [R45.851]   . Suicidal ideation [R45.851] 11/20/2014  . Anxiousness [F41.9] 09/11/2013  . Substance induced mood disorder ([place]) [F19.94] 06/20/2013  . S/P alcohol detoxification [Z09] 09/27/2012  . Alcohol dependence ([place]) [F10.20] 01/29/2012  . Type II or unspecified type diabetes mellitus without mention of complication, uncontrolled [E11.65] 11/11/2011  . Thrombocytopenia ([place]) [D69.6]   . Hyperglycemia [R73.9]   . Alcoholic ([place]) [Z32.99]   . Chronic hyponatremia [E87.1]   . Alcohol dependence with acute alcoholic intoxication ([place]) [F10.229] 11/06/2011  . Diabetes mellitus ([place]) [E11.9] 11/06/2011  . Hepatitis C [B19.20] 11/06/2011   History of Present Illness:: [name] indicates that he has had issues with alcohol for the past 28 years. He states that he recently ended a 3 month period of sobriety. He states things were going well he was living in [place] and working full-time in [employer]. He states that what started his relapse was that he was a senior member of the [employer] and tried to enforce the rules on newer members. He states that eventually these newer members grew and devoted him out of the house. He states he has been out of the [place] for 3 weeks and that is when his alcohol use resumed. He  states he's drinking 5-640 ounce beers a day. Patient denies issues with depression or anxiety outside of the time he is drinking. He denies any suicidal ideation or any past suicide attempts. Patient reports that he is currently homeless because he cannot "return to the house I was staying because the men there drink all the time. I need to get in contact with the [facility]."   Associated Signs/Symptoms: Depression Symptoms:  depressed mood, anhedonia, insomnia, disturbed sleep, (Hypo) Manic Symptoms:  None Anxiety Symptoms:  Some anxiety related to being away from alcohol and per patient report withdrawal Psychotic Symptoms:  None PTSD Symptoms: Negative Total Time spent with patient: 1 hour  Past Psychiatric History: He states his longest period of sobriety was 6 years when he was raising his young children. He believes he's been in treatment facilities 20 times. Patient has several admissions to the adult [facility] unit on the [address] hall. He denies any suicide attempts. He denies having any counseling or medication management states that he's never had mood disturbance aside from when he is drinking.   Risk to Self: Is patient at risk for suicide?: No Risk to Others:   Prior Inpatient Therapy:   Prior Outpatient Therapy:    Alcohol Screening: 1. How often do you have a drink containing alcohol?: 4 or more times a week 2. How many drinks containing alcohol do you have on a typical day when you are drinking?: 10 or more 3. How often do you have six or more drinks on one occasion?: Daily or almost daily Preliminary Score: 8 4. How often during the last year have you found that you were not able to stop drinking once you  had started?: Monthly 5. How often during the last year have you failed to do what was normally expected from you becasue of drinking?: Monthly 6. How often during the last year have you needed a first drink in the morning to get yourself going after a heavy drinking session?: Less than  monthly 7. How often during the last year have you had a feeling of guilt of remorse after drinking?: Never 8. How often during the last year have you been unable to remember what happened the night before because you had been drinking?: Monthly 9. Have you or someone else been injured as a result of your drinking?: No 10. Has a relative or friend or a doctor or another health worker been concerned about your drinking or suggested you cut down?: No Alcohol Use Disorder Identification Test Final Score (AUDIT): 19 Brief Intervention: Patient declined brief intervention Substance Abuse History in the last 12 months:  Yes.   Consequences of Substance Abuse: Patient indicates he has had social consequences from his drinking. Previous Psychotropic Medications: No  Psychological Evaluations: No  Past Medical History:  Past Medical History  Diagnosis Date  . Diabetes mellitus   . Chronic hyponatremia     pt denies having   . Alcoholic ([place])   . Alcoholic cirrhosis of liver ([place])     Pt calls this a false diagnosis. Pt denies  . Thrombocytopenia ([place])   . Hyperglycemia   . Depression   . Anxiety   . Alcoholism /alcohol abuse ([place])   . Diabetes mellitus without complication ([place])   . Hepatitis C   . Neuropathy [facility])     Past Surgical History  Procedure Laterality Date  . Plate in lower right leg  2008  . Fracture surgery  right lower leg plate placed years ago   Family History:  Family History  Problem Relation Age of Onset  . ALS Father    Family Psychiatric  History: He denies any knowledge of any family history of mental illness Social History:  History  Alcohol Use  . Yes    Comment: 18/daily     History  Drug Use No    Social History   Social History  . Marital Status: Divorced    Spouse Name: N/A  . Number of Children: N/A  . Years of Education: N/A   Social History Main Topics  . Smoking status: Current Every Day Smoker -- 1.00 packs/day for 40 years    Types:  Cigarettes  . Smokeless tobacco: Not on file  . Alcohol Use: Yes     Comment: 18/daily  . Drug Use: No  . Sexual Activity: Not Currently   Other Topics Concern  . Not on file   Social History Narrative   ** Merged History Encounter **       Additional Social History:    Pain Medications: SEE MAR Prescriptions: SEE MAR Over the Counter: SEE MAR History of alcohol / drug use?: Yes Longest period of sobriety (when/how long): 6 yrs Negative Consequences of Use: Financial, Scientist, research (physical sciences), Personal relationships, Work / School Withdrawal Symptoms: Agitation, Diarrhea, Sweats, Tremors, Fever / [place] Name of Substance 1: Alcohol  1 - Age of First Use: 59 yrs old  1 - Amount (size/oz): (5) 40 oz beers 1 - Frequency: daily  1 - Duration: 1 month 1 - Last Use / Amount: "Last night 2:30am"                  Allergies:  Allergies  Allergen Reactions  . Benadryl [Diphenhydramine Hcl] Other (See Comments)    "Causes nervousness"  . Benadryl [Diphenhydramine] Other (See Comments)    "makes my skin crawl"  . Sulfa Antibiotics Other (See Comments)    Childhood allergy  . Tegretol [Carbamazepine] Other (See Comments)    "almost killed me"  . Librium [Chlordiazepoxide Hcl] Anxiety  . Sulfa Antibiotics Rash   Lab Results:  Results for orders placed or performed during the hospital encounter of 04/21/15 (from the past 48 hour(s))  Glucose, capillary     Status: Abnormal   Collection Time: 04/21/15  9:31 PM  Result Value Ref Range   Glucose-Capillary 314 (H) 65 - 99 mg/dL  Glucose, capillary     Status: Abnormal   Collection Time: 04/22/15  6:22 AM  Result Value Ref Range   Glucose-Capillary 225 (H) 65 - 99 mg/dL    Metabolic Disorder Labs:  Lab Results  Component Value Date   HGBA1C 9.6* 09/13/2013   MPG 229* 09/13/2013   MPG 235* 06/20/2013   No results found for: PROLACTIN No results found for: CHOL, TRIG, HDL, CHOLHDL, VLDL, LDLCALC  Current Medications: Current  Facility-Administered Medications  Medication Dose Route Frequency Provider Last Rate Last Dose  . acetaminophen (TYLENOL) tablet 650 mg  650 mg Oral Q6H PRN Patrecia Pour, NP      . alum & mag hydroxide-simeth (MAALOX/MYLANTA) 200-200-20 MG/5ML suspension 30 mL  30 mL Oral Q4H PRN Patrecia Pour, NP      . hydrOXYzine (ATARAX/VISTARIL) tablet 25 mg  25 mg Oral Q6H PRN Niel Hummer, NP      . insulin aspart (novoLOG) injection 0-15 Units  0-15 Units Subcutaneous TID WC Niel Hummer, NP   5 Units at 04/22/15 0636  . insulin aspart (novoLOG) injection 0-5 Units  0-5 Units Subcutaneous QHS Niel Hummer, NP   4 Units at 04/21/15 2145  . insulin aspart (novoLOG) injection 4 Units  4 Units Subcutaneous TID WC Niel Hummer, NP   4 Units at 04/22/15 [phone]  . loperamide (IMODIUM) capsule 2-4 mg  2-4 mg Oral PRN Niel Hummer, NP      . LORazepam (ATIVAN) tablet 1 mg  1 mg Oral Q6H PRN Niel Hummer, NP   1 mg at 04/22/15 0307  . LORazepam (ATIVAN) tablet 1 mg  1 mg Oral QID Niel Hummer, NP   1 mg at 04/22/15 2202   Followed by  . [START ON 04/23/2015] LORazepam (ATIVAN) tablet 1 mg  1 mg Oral TID Niel Hummer, NP       Followed by  . [START ON 04/24/2015] LORazepam (ATIVAN) tablet 1 mg  1 mg Oral BID Niel Hummer, NP       Followed by  . [START ON 04/25/2015] LORazepam (ATIVAN) tablet 1 mg  1 mg Oral Daily Niel Hummer, NP      . magnesium hydroxide (MILK OF MAGNESIA) suspension 30 mL  30 mL Oral Daily PRN Patrecia Pour, NP      . metFORMIN (GLUCOPHAGE) tablet 1,000 mg  1,000 mg Oral BID WC Patrecia Pour, NP   1,000 mg at 04/22/15 0820  . multivitamin with minerals tablet 1 tablet  1 tablet Oral Daily Niel Hummer, NP   1 tablet at 04/22/15 [phone]  . ondansetron (ZOFRAN-ODT) disintegrating tablet 4 mg  4 mg Oral Q6H PRN Niel Hummer, NP      . thiamine (  VITAMIN B-1) tablet 100 mg  100 mg Oral Daily Patrecia Pour, NP   100 mg at 04/22/15 0820   Or  . thiamine (B-1) injection 100 mg  100 mg  Intravenous Daily Patrecia Pour, NP      . traZODone (DESYREL) tablet 50 mg  50 mg Oral QHS PRN Niel Hummer, NP       PTA Medications: Prescriptions prior to admission  Medication Sig Dispense Refill Last Dose  . FLUoxetine (PROZAC) 10 MG capsule Take 1 capsule (10 mg total) by mouth daily. (Patient not taking: Reported on 12/01/2014) 30 capsule 0 Not Taking at Unknown time  . gabapentin (NEURONTIN) 300 MG capsule Take 2 capsules (600 mg total) by mouth 3 (three) times daily. 42 capsule 0 1 week ago  . glimepiride (AMARYL) 4 MG tablet Take 1 tablet (4 mg total) by mouth daily with breakfast. (Patient not taking: Reported on 03/28/2015) 7 tablet 0   . hydrOXYzine (ATARAX/VISTARIL) 50 MG tablet Take 1 tablet (50 mg total) by mouth every 8 (eight) hours as needed. (Patient not taking: Reported on 03/28/2015) 60 tablet 0   . ibuprofen (ADVIL,MOTRIN) 200 MG tablet Take 800 mg by mouth 2 (two) times daily as needed for moderate pain.   Past Month at Unknown time  . insulin NPH Human (HUMULIN N,NOVOLIN N) 100 UNIT/ML injection Inject 0.08 mLs (8 Units total) into the skin 2 (two) times daily. (Patient not taking: Reported on 01/04/2015) 10 mL 0 Not Taking at Unknown time  . metFORMIN (GLUCOPHAGE) 1000 MG tablet Take 1 tablet (1,000 mg total) by mouth 2 (two) times daily with a meal. 14 tablet 0 04/20/2015 at Unknown time  . mirtazapine (REMERON) 15 MG tablet Take 1 tablet (15 mg total) by mouth at bedtime. (Patient not taking: Reported on 03/28/2015) 7 tablet 0     Musculoskeletal: Strength & Muscle Tone: within normal limits Gait & Station: normal Patient leans: N/A  Psychiatric Specialty Exam: Physical Exam  Review of Systems  Constitutional: Positive for malaise/fatigue.  HENT: Negative.   Eyes: Negative.   Respiratory: Negative.   Cardiovascular: Negative.   Gastrointestinal: Positive for nausea and diarrhea.  Genitourinary: Negative.   Musculoskeletal: Negative.        He reports  neuropathic pain in his feet that response to Neurontin  Skin: Negative.   Neurological: Positive for tremors.  Endo/Heme/Allergies: Negative.   Psychiatric/Behavioral: Positive for substance abuse. Negative for depression, suicidal ideas, hallucinations and memory loss. The patient is nervous/anxious (Per patient related to detoxing from alcohol) and has insomnia (related to detoxing from alcohol per patient report).   All other systems reviewed and are negative.   Blood pressure 158/74, pulse 86, temperature 98.5 F (36.9 C), temperature source Oral, resp. rate 20, height 6' (1.829 m), weight 77.111 kg (170 lb), SpO2 98 %.Body mass index is 23.05 kg/(m^2).  General Appearance: Disheveled  Eye Contact::  Good  Speech:  Normal Rate  Volume:  Normal  Mood:  Okay  Affect:  Depressed  Thought Process:  Linear and Logical  Orientation:  Full (Time, Place, and Person)  Thought Content:  WDL  Suicidal Thoughts:  No  Homicidal Thoughts:  No  Memory:  Immediate;   Good Recent;   Good Remote;   Good  Judgement:  Fair  Insight:  Fair  Psychomotor Activity:  Negative  Concentration:  Good  Recall:  Good  Fund of Knowledge:Good  Language: Good  Akathisia:  Negative  Handed:  AIMS (if indicated):     Assets:  Communication Skills Desire for Improvement Vocational/Educational  ADL's:  Intact  Cognition: WNL  Sleep:        Treatment Plan Summary: Daily contact with patient to assess and evaluate symptoms and progress in treatment, Medication management and Plan   Alcohol use disorder, severe-patient will be on CIWA protocol. We will also start him on some standing Ativan given his reports of problematic withdrawals in the past.   Depression-this appears related to his alcohol and thus we will monitor this or any signs symptoms of a separate depressive disorder  Diabetes-metformin 1000 mg twice a day, Initiate Novolog sliding scale coverage with 4 units of meal coverage    Insomnia- trazodone as needed  Dispo-once through detox will assess for referral to residential program or intensive outpatient treatment.  Observation Level/Precautions:  Continuous Observation Detox  Laboratory:  Reviewed  Psychotherapy:  Individual   Medications:  Initiate Ativan taper to manage withdrawal from alcohol   Consultations:  As needed   Discharge Concerns:  Continued alcohol abuse   Estimated LOS: Less than 48 hours   Other:     Isaac Dubie, NP-C 12/13/20169:40 AM

## 2015-04-23 DIAGNOSIS — F10230 Alcohol dependence with withdrawal, uncomplicated: Secondary | ICD-10-CM

## 2015-04-23 LAB — GLUCOSE, CAPILLARY
GLUCOSE-CAPILLARY: 316 mg/dL — AB (ref 65–99)
GLUCOSE-CAPILLARY: 156 mg/dL — AB (ref 65–99)
Glucose-Capillary: 235 mg/dL — ABNORMAL HIGH (ref 65–99)

## 2015-04-23 NOTE — Progress Notes (Signed)
Nursing Shift Assessment  Patient is sleeping but is easily awakened; is pleasant and cooperative; compliant with medications; has eaten close to 75% of his breakfast but is now c/o'g some nausea, but no vomiting. Patient still c/o'g withdrawal symptoms, scoring a 9 on latest CWAI scale. Patient denies any HI/SI/VH but does admit to "hearing voices" all the time but they are not disturbing.  Nurse administering medications as ordered and ensuring patient having no hyper or hypoglycemic s/s. Nurse ensuring continuous observation of patient except when in the bathroom; also providing emotional support and encouragement and therapeutic communication.  Patient remains safe on unit and verbally agrees to contract for safety, agreeing to alert staff should he develop any SI and plan to harm himself.

## 2015-04-23 NOTE — Progress Notes (Signed)
Pt in bed asleep.  Writer familiar with patient and hx.

## 2015-04-23 NOTE — Progress Notes (Signed)
Patient ID: [name], male   DOB: 11/01/1955, 59 y.o.   MRN: [id number] Patient continues to complain of multiple withdrawal symptoms during the day.  He states he was drinking heavily due to people he lives with that drink all day long long.  He still c/o tremors and diarrhea.  We will reassess in the am.  D/C planning includes [facility].

## 2015-04-23 NOTE — BH Assessment (Signed)
[place] Assessment Progress Note   Patient was assessed this date to determine disposition by Malachi Carl NP and Markee Remlinger LCAS. Patient stated they are still suffering from ETOH withdrawals reporting tremors, continued nausea and diarrhea. Patient continues to endorse SI with plan if released to harm himself by first, "getting drunk" and then walking into traffic. Patient will be monitored in the Observational Unit and discharged in the a.m. and linked to [facility] to explore housing options.

## 2015-04-23 NOTE — Progress Notes (Signed)
Pt asleep at shift change.  Ate about 60% of meal earlier.  Woke up and asked for drinks and snacks.  CBG drawn with BS of 238.  2 units given.  At aprox 01:00 pt woke up with loose stool in bed.  Pt took shower, lenins changed and Imodium 4 mg given per PRN order.  Pt moved to bed 6.

## 2015-04-24 LAB — GLUCOSE, CAPILLARY
GLUCOSE-CAPILLARY: 256 mg/dL — AB (ref 65–99)
GLUCOSE-CAPILLARY: 219 mg/dL — AB (ref 65–99)
Glucose-Capillary: 213 mg/dL — ABNORMAL HIGH (ref 65–99)

## 2015-04-24 MED ORDER — METFORMIN HCL 1000 MG PO TABS: 1000.0000 mg | ORAL_TABLET | Freq: Two times a day (BID) | ORAL | Status: DC

## 2015-04-24 MED ORDER — TRAZODONE HCL 50 MG PO TABS: 50.0000 mg | ORAL_TABLET | Freq: Every evening | ORAL | Status: DC | PRN

## 2015-04-24 MED ORDER — HYDROXYZINE HCL 25 MG PO TABS: 25.0000 mg | ORAL_TABLET | Freq: Four times a day (QID) | ORAL | Status: DC | PRN

## 2015-04-24 MED ORDER — GABAPENTIN 600 MG PO TABS: 600.0000 mg | ORAL_TABLET | Freq: Three times a day (TID) | ORAL | Status: DC

## 2015-04-24 NOTE — Progress Notes (Signed)
Patient ID: [name], male   DOB: 07-30-1955, 59 y.o.   MRN: [id number] Patient discharged per MD orders. Patient given education regarding follow-up appointments and medications. Patient denies any questions or concerns about these instructions. Patient was escorted to locker and given belongings before discharge to hospital lobby. Patient currently denies SI/HI and auditory and visual hallucinations on discharge.

## 2015-04-24 NOTE — Discharge Summary (Signed)
OBS Discharge Summary Note  Patient:  [name] is an 59 y.o., male MRN:  [id number] DOB:  Jan 10, 1956 Patient phone:  [phone] (home)  Patient address:   [address] [place] [address],  Total Time spent with patient: 45 minutes  Date of Admission:  04/21/2015 Date of Discharge: 04/24/2015  Reason for Admission:  Substance abuse   Principal Problem: Alcohol dependence with uncomplicated withdrawal [facility]) Discharge Diagnoses: Patient Active Problem List   Diagnosis Date Noted  . Moderate major depression ([place]) [F32.1] 04/21/2015  . Major depressive disorder, single episode, moderate ([place]) [F32.1] 03/10/2015  . Alcohol dependence with uncomplicated withdrawal ([place]) [F10.230] 03/03/2015  . Alcohol abuse [F10.10]   . Suicidal ideation [R45.851]   . Suicidal ideation [R45.851] 11/20/2014  . Anxiousness [F41.9] 09/11/2013  . Substance induced mood disorder ([place]) [F19.94] 06/20/2013  . S/P alcohol detoxification [Z09] 09/27/2012  . Alcohol dependence ([place]) [F10.20] 01/29/2012  . Type II or unspecified type diabetes mellitus without mention of complication, uncontrolled [E11.65] 11/11/2011  . Thrombocytopenia ([place]) [D69.6]   . Hyperglycemia [R73.9]   . Alcoholic ([place]) [O13.08]   . Chronic hyponatremia [E87.1]   . Alcohol dependence with acute alcoholic intoxication ([place]) [F10.229] 11/06/2011  . Diabetes mellitus ([place]) [E11.9] 11/06/2011  . Hepatitis C [B19.20] 11/06/2011    Past Psychiatric History:  Depression  Past Medical History:  Past Medical History  Diagnosis Date  . Diabetes mellitus   . Chronic hyponatremia     pt denies having   . Alcoholic ([place])   . Alcoholic cirrhosis of liver ([place])     Pt calls this a false diagnosis. Pt denies  . Thrombocytopenia ([place])   . Hyperglycemia   . Depression   . Anxiety   . Alcoholism /alcohol abuse ([place])   . Diabetes mellitus without complication ([place])   . Hepatitis C   . Neuropathy [facility])     Past Surgical  History  Procedure Laterality Date  . Plate in lower right leg  2008  . Fracture surgery  right lower leg plate placed years ago   Family History:  Family History  Problem Relation Age of Onset  . ALS Father    Family Psychiatric  History: Denies Social History:  History  Alcohol Use  . Yes    Comment: 18/daily     History  Drug Use No    Social History   Social History  . Marital Status: Divorced    Spouse Name: N/A  . Number of Children: N/A  . Years of Education: N/A   Social History Main Topics  . Smoking status: Current Every Day Smoker -- 1.00 packs/day for 40 years    Types: Cigarettes  . Smokeless tobacco: Not on file  . Alcohol Use: Yes     Comment: 18/daily  . Drug Use: No  . Sexual Activity: Not Currently   Other Topics Concern  . Not on file   Social History Narrative   ** Merged History Encounter **        Hospital Course:  [name] was admitted for Alcohol dependence with uncomplicated withdrawal [name]) and crisis management.  He was treated with the following medications as listed below.  [name] was discharged with current medication and was instructed on how to take medications as prescribed; (details listed below under Medication List).  Medical problems were identified and treated as needed.  Home medications were restarted as appropriate.  Improvement was monitored by observation and [name] daily  report of symptom reduction.  Emotional and mental status was monitored by daily self-inventory reports completed by [name] and clinical staff.         [name] was evaluated by the treatment team for stability and plans for continued recovery upon discharge.  [name] motivation was an integral factor for scheduling further treatment.  Employment, transportation, bed availability, health status, family support, and any pending legal issues were also considered during his hospital stay.  He was  offered further treatment options upon discharge including but not limited to Residential, Intensive Outpatient, and Outpatient treatment.  [name] will follow up with the services as listed below under Follow Up Information.     Upon completion of this admission the [name] was both mentally and medically stable for discharge denying suicidal/homicidal ideation, auditory/visual/tactile hallucinations, delusional thoughts and paranoia.     Physical Findings: AIMS: Facial and Oral Movements Muscles of Facial Expression: None, normal Lips and Perioral Area: None, normal Jaw: None, normal Tongue: None, normal,Extremity Movements Upper (arms, wrists, hands, fingers): None, normal Lower (legs, knees, ankles, toes): None, normal, Trunk Movements Neck, shoulders, hips: None, normal, Overall Severity Severity of abnormal movements (highest score from questions above): None, normal Incapacitation due to abnormal movements: None, normal Patient's awareness of abnormal movements (rate only patient's report): No Awareness, Dental Status Current problems with teeth and/or dentures?: No Does patient usually wear dentures?: No  CIWA:  CIWA-Ar Total: 10 COWS:     Musculoskeletal: Strength & Muscle Tone: within normal limits Gait & Station: normal Patient leans: N/A  Psychiatric Specialty Exam: Review of Systems  All other systems reviewed and are negative.   Blood pressure 132/69, pulse 98, temperature 98.5 F (36.9 C), temperature source Oral, resp. rate 18, height 6' (1.829 m), weight 77.111 kg (170 lb), SpO2 98 %.Body mass index is 23.05 kg/(m^2).  General Appearance: Neat  Eye Contact::  Fair  Speech:  Clear and Coherent  Volume:  Normal  Mood:  Euthymic  Affect:  Congruent  Thought Process:  Coherent  Orientation:  Full (Time, Place, and Person)  Thought Content:  Rumination  Suicidal Thoughts:  No  Homicidal Thoughts:  No  Memory:  Immediate;   Fair Recent;    Fair Remote;   Fair  Judgement:  Intact  Insight:  Fair  Psychomotor Activity:  Normal  Concentration:  Good  Recall:  Good  Fund of Knowledge:Good  Language: Good  Akathisia:  Yes  Handed:  Right  AIMS (if indicated):     Assets:  Desire for Improvement Physical Health Social Support  ADL's:  Intact  Cognition: WNL  Sleep:  fair   Have you used any form of tobacco in the last 30 days? (Cigarettes, Smokeless Tobacco, Cigars, and/or Pipes): Yes  Has this patient used any form of tobacco in the last 30 days? (Cigarettes, Smokeless Tobacco, Cigars, and/or Pipes) Yes, N/A  Metabolic Disorder Labs:  Lab Results  Component Value Date   HGBA1C 9.6* 09/13/2013   MPG 229* 09/13/2013   MPG 235* 06/20/2013   No results found for: PROLACTIN No results found for: CHOL, TRIG, HDL, CHOLHDL, VLDL, LDLCALC  See Psychiatric Specialty Exam and Suicide Risk Assessment completed by Attending Physician prior to discharge.  Discharge destination:  Home  Is patient on multiple antipsychotic therapies at discharge:  No   Has Patient had three or more failed trials of antipsychotic monotherapy by history:  No  Recommended Plan for Multiple Antipsychotic  Therapies: NA     Medication List    STOP taking these medications        FLUoxetine 10 MG capsule  Commonly known as:  PROZAC     gabapentin 300 MG capsule  Commonly known as:  NEURONTIN  Replaced by:  gabapentin 600 MG tablet     glimepiride 4 MG tablet  Commonly known as:  AMARYL     ibuprofen 200 MG tablet  Commonly known as:  ADVIL,MOTRIN     insulin NPH Human 100 UNIT/ML injection  Commonly known as:  HUMULIN N,NOVOLIN N     mirtazapine 15 MG tablet  Commonly known as:  REMERON      TAKE these medications      Indication   gabapentin 600 MG tablet  Commonly known as:  NEURONTIN  Take 1 tablet (600 mg total) by mouth 3 (three) times daily.   Indication:  Alcohol Withdrawal Syndrome, Diabetes with Nerve Disease      hydrOXYzine 25 MG tablet  Commonly known as:  ATARAX/VISTARIL  Take 1 tablet (25 mg total) by mouth every 6 (six) hours as needed (anxiety/agitation or CIWA < or = 10).   Indication:  Anxiety Neurosis     metFORMIN 1000 MG tablet  Commonly known as:  GLUCOPHAGE  Take 1 tablet (1,000 mg total) by mouth 2 (two) times daily with a meal.   Indication:  Type 2 Diabetes     traZODone 50 MG tablet  Commonly known as:  DESYREL  Take 1 tablet (50 mg total) by mouth at bedtime as needed for sleep.   Indication:  Trouble Sleeping         Follow-up recommendations:  Activity:  as tol Diet:  as tol  Comments:  1.  Take all your medications as prescribed.              2.  Report any adverse side effects to outpatient provider.                       3.  Patient instructed to not use alcohol or illegal drugs while on prescription medicines.            4.  In the event of worsening symptoms, instructed patient to call 911, the crisis hotline or go to nearest emergency room for evaluation of symptoms.  Signed: Freda Munro May Bee Marchiano AGNP-BC 04/24/2015, 10:53 AM

## 2015-04-24 NOTE — B[facility] Counselor (Signed)
[facility] Assessment Progress Note  Nurse, Maudie Mercury, and this counselor spoke with pt about his withdrawal symptoms on this AM- his 4th day of detox. Pt indicated that he was feeling better and it is normal for him to detox for "4 full days". Pt stated that he wanted to stay until tomorrow morning. Counselor informed pt that he would more than likely be released today and he would be able to go to the [facility] for shelter assistance. Pt also acknowledged that, due to the colder weather, emergency shelters are open throughout the winter. Pt requested for his clothes to be washed before he was d/c. Nurse put pt's clothes in the washer.   Kenna Gilbert. Lovena Le, [place], [place], [facility]CA Counselor

## 2015-08-18 ENCOUNTER — Encounter (HOSPITAL_COMMUNITY): Payer: Self-pay | Admitting: Emergency Medicine

## 2015-08-18 ENCOUNTER — Emergency Department (HOSPITAL_COMMUNITY)
Admission: EM | Admit: 2015-08-18 | Discharge: 2015-08-18 | Disposition: A | Discharge status: Home / Self Care | Payer: No Typology Code available for payment source | Attending: Emergency Medicine | Admitting: Emergency Medicine

## 2015-08-18 DIAGNOSIS — Z8719 Personal history of other diseases of the digestive system: Secondary | ICD-10-CM | POA: Insufficient documentation

## 2015-08-18 DIAGNOSIS — F419 Anxiety disorder, unspecified: Secondary | ICD-10-CM | POA: Insufficient documentation

## 2015-08-18 DIAGNOSIS — Z8619 Personal history of other infectious and parasitic diseases: Secondary | ICD-10-CM | POA: Insufficient documentation

## 2015-08-18 DIAGNOSIS — T50905A Adverse effect of unspecified drugs, medicaments and biological substances, initial encounter: Secondary | ICD-10-CM

## 2015-08-18 DIAGNOSIS — F1210 Cannabis abuse, uncomplicated: Secondary | ICD-10-CM | POA: Insufficient documentation

## 2015-08-18 DIAGNOSIS — G629 Polyneuropathy, unspecified: Secondary | ICD-10-CM | POA: Insufficient documentation

## 2015-08-18 DIAGNOSIS — E119 Type 2 diabetes mellitus without complications: Secondary | ICD-10-CM | POA: Insufficient documentation

## 2015-08-18 DIAGNOSIS — Z7984 Long term (current) use of oral hypoglycemic drugs: Secondary | ICD-10-CM | POA: Insufficient documentation

## 2015-08-18 DIAGNOSIS — F17210 Nicotine dependence, cigarettes, uncomplicated: Secondary | ICD-10-CM | POA: Insufficient documentation

## 2015-08-18 DIAGNOSIS — Z79899 Other long term (current) drug therapy: Secondary | ICD-10-CM | POA: Insufficient documentation

## 2015-08-18 DIAGNOSIS — R251 Tremor, unspecified: Secondary | ICD-10-CM | POA: Insufficient documentation

## 2015-08-18 DIAGNOSIS — Z862 Personal history of diseases of the blood and blood-forming organs and certain disorders involving the immune mechanism: Secondary | ICD-10-CM | POA: Insufficient documentation

## 2015-08-18 DIAGNOSIS — F329 Major depressive disorder, single episode, unspecified: Secondary | ICD-10-CM | POA: Insufficient documentation

## 2015-08-18 LAB — I-STAT CHEM 8, ED
BUN: 18 mg/dL (ref 6–20)
CREATININE: 0.5 mg/dL — AB (ref 0.61–1.24)
Calcium, Ion: 1.17 mmol/L (ref 1.12–1.23)
Chloride: 99 mmol/L — ABNORMAL LOW (ref 101–111)
Glucose, Bld: 156 mg/dL — ABNORMAL HIGH (ref 65–99)
HEMATOCRIT: 36 % — AB (ref 39.0–52.0)
Hemoglobin: 12.2 g/dL — ABNORMAL LOW (ref 13.0–17.0)
POTASSIUM: 3.5 mmol/L (ref 3.5–5.1)
SODIUM: 136 mmol/L (ref 135–145)
TCO2: 25 mmol/L (ref 0–100)

## 2015-08-18 LAB — CBG MONITORING, ED: Glucose-Capillary: 144 mg/dL — ABNORMAL HIGH (ref 65–99)

## 2015-08-18 LAB — ETHANOL

## 2015-08-18 LAB — RAPID URINE DRUG SCREEN, HOSP PERFORMED
AMPHETAMINES: NOT DETECTED
Barbiturates: NOT DETECTED
Benzodiazepines: NOT DETECTED
Cocaine: NOT DETECTED
OPIATES: NOT DETECTED
TETRAHYDROCANNABINOL: POSITIVE — AB

## 2015-08-18 NOTE — ED Notes (Signed)
Pt ambulated without assistance, pt steady on feet; no difficulty with ambulation

## 2015-08-18 NOTE — ED Notes (Signed)
Per the pills the patient showed [place]; pt took 671m Seroquel

## 2015-08-18 NOTE — Discharge Instructions (Signed)
You were seen today after taking someone else's medication. You took Seroquel. While the dosage that you took was not enough to harm you, you should not take someone else's medication.

## 2015-08-18 NOTE — ED Notes (Signed)
Pt states he ran out of his gabapentin that he takes daily and borrowed one from a friend. Now, 5 hours later, he thinks he may have taken thorazine because he feels jittery and that's how that makes him feel. Alert and oriented.

## 2015-08-18 NOTE — ED Provider Notes (Signed)
CSN: [id number]     Arrival date & time 08/18/15  0052 History  By signing my name below, I, Rowan Blase, attest that this documentation has been prepared under the direction and in the presence of Merryl Hacker, MD . Electronically Signed: Rowan Blase, Scribe. 08/18/2015. 1:20 AM.   Chief Complaint  Patient presents with  . Medication Reaction    The history is provided by the patient. No language interpreter was used.   HPI Comments:  [name] is a 60 y.o. male with PMHx of DM, alcoholism, hepatitis C and neuropathy who presents to the Emergency Department complaining of anxiety after taking an unknown medication ~5 hours ago. No alleviating factors noted. He ran out of gabapentin and took medication belonging to a friend. Pt brought pills to the ED and it appears he took 2 400 mg quetiapine (yellow oval pill w/ 336). Pt reports tremors. Pt denies current pain.  Past Medical History  Diagnosis Date  . Diabetes mellitus   . Chronic hyponatremia     pt denies having   . Alcoholic ([place])   . Alcoholic cirrhosis of liver ([place])     Pt calls this a false diagnosis. Pt denies  . Thrombocytopenia ([place])   . Hyperglycemia   . Depression   . Anxiety   . Alcoholism /alcohol abuse ([place])   . Diabetes mellitus without complication ([place])   . Hepatitis C   . Neuropathy [facility])    Past Surgical History  Procedure Laterality Date  . Plate in lower right leg  2008  . Fracture surgery  right lower leg plate placed years ago   Family History  Problem Relation Age of Onset  . ALS Father    Social History  Substance Use Topics  . Smoking status: Current Every Day Smoker -- 1.00 packs/day for 40 years    Types: Cigarettes  . Smokeless tobacco: None  . Alcohol Use: Yes     Comment: 18/daily    Review of Systems  Respiratory: Negative for chest tightness.   Cardiovascular: Negative for chest pain.  Gastrointestinal: Negative for nausea and vomiting.  Musculoskeletal:  Negative for myalgias and arthralgias.  Neurological: Positive for tremors.  Psychiatric/Behavioral: The patient is nervous/anxious.   All other systems reviewed and are negative.  Allergies  Benadryl; Benadryl; Sulfa antibiotics; Tegretol; Librium; and Sulfa antibiotics  Home Medications   Prior to Admission medications   Medication Sig Start Date End Date Taking? Authorizing Provider  gabapentin (NEURONTIN) 600 MG tablet Take 1 tablet (600 mg total) by mouth 3 (three) times daily. 04/24/15  Yes Kerrie Buffalo, NP  metFORMIN (GLUCOPHAGE) 1000 MG tablet Take 1 tablet (1,000 mg total) by mouth 2 (two) times daily with a meal. 04/24/15  Yes Kerrie Buffalo, NP  hydrOXYzine (ATARAX/VISTARIL) 25 MG tablet Take 1 tablet (25 mg total) by mouth every 6 (six) hours as needed (anxiety/agitation or CIWA < or = 10). Patient not taking: Reported on 08/18/2015 04/24/15   Kerrie Buffalo, NP  traZODone (DESYREL) 50 MG tablet Take 1 tablet (50 mg total) by mouth at bedtime as needed for sleep. Patient not taking: Reported on 08/18/2015 04/24/15   Kerrie Buffalo, NP   BP 122/76 mmHg  Pulse 108  Temp(Src) 97.6 F (36.4 C) (Oral)  Resp 19  SpO2 98% Physical Exam  Constitutional: He is oriented to person, place, and time. No distress.  Anxious appearing, at times somnolent  HENT:  Head: Normocephalic and atraumatic.  Eyes: Pupils are equal, round,  and reactive to light.  Cardiovascular: Regular rhythm and normal heart sounds.   No murmur heard. Tachycardia  Pulmonary/Chest: Effort normal and breath sounds normal. No respiratory distress. He has no wheezes.  Abdominal: Soft. There is no tenderness.  Musculoskeletal: He exhibits no edema.  Neurological: He is alert and oriented to person, place, and time.  At times somnolent but oriented 3  Skin: Skin is warm and dry.  Psychiatric: He has a normal mood and affect.  Nursing note and vitals reviewed.   ED Course  Procedures  DIAGNOSTIC  STUDIES:  Oxygen Saturation is 97% on RA, normal by my interpretation.    COORDINATION OF CARE:  1:17 AM Will order UA, ETOH, and EKG. Discussed treatment plan with pt at bedside and pt agreed to plan.  Labs Review Labs Reviewed  URINE RAPID DRUG SCREEN, HOSP PERFORMED - Abnormal; Notable for the following:    Tetrahydrocannabinol POSITIVE (*)    All other components within normal limits  I-STAT CHEM 8, ED - Abnormal; Notable for the following:    Chloride 99 (*)    Creatinine, Ser 0.50 (*)    Glucose, Bld 156 (*)    Hemoglobin 12.2 (*)    HCT 36.0 (*)    All other components within normal limits  CBG MONITORING, ED - Abnormal; Notable for the following:    Glucose-Capillary 144 (*)    All other components within normal limits  ETHANOL    Imaging Review No results found. I have personally reviewed and evaluated these images and lab results as part of my medical decision-making.   EKG Interpretation   Date/Time:  Monday August 18 2015 01:19:52 EDT Ventricular Rate:  114 PR Interval:  135 QRS Duration: 104 QT Interval:  341 QTC Calculation: 470 R Axis:   83 Text Interpretation:  Sinus tachycardia Consider left atrial enlargement  Confirmed by HORTON  MD, [place] ([id number]) on 08/18/2015 1:47:09 AM      MDM   Final diagnoses:  Adverse effects of medication, initial encounter    Patient presents after taking an unknown medication. He is at times somnolent but otherwise his exam is reassuring. He is mildly tachycardic. It appears she took 800 mg of Seroquel. This likely explains his intermittent somnolence.  EKG and basic labwork reassuring. Blood alcohol is negative. Discussed with patient at length that he needs to refrain from taking other peoples medications as this can cause ill effects.  After history, exam, and medical workup I feel the patient has been appropriately medically screened and is safe for discharge home. Pertinent diagnoses were discussed with the  patient. Patient was given return precautions.  I personally performed the services described in this documentation, which was scribed in my presence. The recorded information has been reviewed and is accurate.    Merryl Hacker, MD 08/18/15 [phone]

## 2015-10-30 ENCOUNTER — Emergency Department (HOSPITAL_COMMUNITY)
Admission: EM | Admit: 2015-10-30 | Discharge: 2015-10-31 | Payer: No Typology Code available for payment source | Attending: Emergency Medicine | Admitting: Emergency Medicine

## 2015-10-30 ENCOUNTER — Encounter (HOSPITAL_COMMUNITY): Payer: Self-pay

## 2015-10-30 DIAGNOSIS — F10230 Alcohol dependence with withdrawal, uncomplicated: Secondary | ICD-10-CM

## 2015-10-30 DIAGNOSIS — F17210 Nicotine dependence, cigarettes, uncomplicated: Secondary | ICD-10-CM | POA: Insufficient documentation

## 2015-10-30 DIAGNOSIS — F329 Major depressive disorder, single episode, unspecified: Secondary | ICD-10-CM | POA: Insufficient documentation

## 2015-10-30 DIAGNOSIS — F1094 Alcohol use, unspecified with alcohol-induced mood disorder: Secondary | ICD-10-CM

## 2015-10-30 DIAGNOSIS — E119 Type 2 diabetes mellitus without complications: Secondary | ICD-10-CM | POA: Insufficient documentation

## 2015-10-30 DIAGNOSIS — F10129 Alcohol abuse with intoxication, unspecified: Secondary | ICD-10-CM | POA: Insufficient documentation

## 2015-10-30 DIAGNOSIS — F1010 Alcohol abuse, uncomplicated: Secondary | ICD-10-CM

## 2015-10-30 LAB — COMPREHENSIVE METABOLIC PANEL
ALT: 200 U/L — ABNORMAL HIGH (ref 17–63)
AST: 267 U/L — AB (ref 15–41)
Albumin: 3.6 g/dL (ref 3.5–5.0)
Alkaline Phosphatase: 167 U/L — ABNORMAL HIGH (ref 38–126)
Anion gap: 9 (ref 5–15)
BILIRUBIN TOTAL: 0.9 mg/dL (ref 0.3–1.2)
BUN: 9 mg/dL (ref 6–20)
CO2: 27 mmol/L (ref 22–32)
CREATININE: 0.53 mg/dL — AB (ref 0.61–1.24)
Calcium: 8.3 mg/dL — ABNORMAL LOW (ref 8.9–10.3)
Chloride: 96 mmol/L — ABNORMAL LOW (ref 101–111)
GFR calc Af Amer: >60 mL/min (ref >60)
Glucose, Bld: 328 mg/dL — ABNORMAL HIGH (ref 65–99)
POTASSIUM: 4.1 mmol/L (ref 3.5–5.1)
Sodium: 132 mmol/L — ABNORMAL LOW (ref 135–145)
TOTAL PROTEIN: 7.3 g/dL (ref 6.5–8.1)

## 2015-10-30 LAB — CBC WITH DIFFERENTIAL/PLATELET
BASOS ABS: 0 10*3/uL (ref 0.0–0.1)
BASOS PCT: 1 %
EOS ABS: 0.1 10*3/uL (ref 0.0–0.7)
EOS PCT: 2 %
HCT: 32.9 % — ABNORMAL LOW (ref 39.0–52.0)
Hemoglobin: 11.9 g/dL — ABNORMAL LOW (ref 13.0–17.0)
Lymphocytes Relative: 48 %
Lymphs Abs: 2 10*3/uL (ref 0.7–4.0)
MCH: 32.7 pg (ref 26.0–34.0)
MCHC: 36.2 g/dL — ABNORMAL HIGH (ref 30.0–36.0)
MCV: 90.4 fL (ref 78.0–100.0)
MONO ABS: 0.3 10*3/uL (ref 0.1–1.0)
Monocytes Relative: 8 %
NEUTROS ABS: 1.7 10*3/uL (ref 1.7–7.7)
Neutrophils Relative %: 42 %
PLATELETS: 59 10*3/uL — AB (ref 150–400)
RBC: 3.64 MIL/uL — ABNORMAL LOW (ref 4.22–5.81)
RDW: 13.6 % (ref 11.5–15.5)
WBC: 4.1 10*3/uL (ref 4.0–10.5)

## 2015-10-30 LAB — RAPID URINE DRUG SCREEN, HOSP PERFORMED
Amphetamines: NOT DETECTED
BENZODIAZEPINES: NOT DETECTED
Barbiturates: NOT DETECTED
COCAINE: NOT DETECTED
Opiates: NOT DETECTED
Tetrahydrocannabinol: NOT DETECTED

## 2015-10-30 LAB — ETHANOL: ALCOHOL ETHYL (B): 289 mg/dL — AB (ref <5)

## 2015-10-30 MED ORDER — VITAMIN B-1 100 MG PO TABS
100.0000 mg | ORAL_TABLET | Freq: Every day | ORAL | Status: DC
  Administered 2015-10-30 – 2015-10-31 (×2): 100 mg via ORAL
  Filled 2015-10-30 (×2): qty 1

## 2015-10-30 MED ORDER — GABAPENTIN 300 MG PO CAPS
600.0000 mg | ORAL_CAPSULE | Freq: Three times a day (TID) | ORAL | Status: DC
  Administered 2015-10-31: 600 mg via ORAL
  Filled 2015-10-30: qty 2

## 2015-10-30 MED ORDER — THIAMINE HCL 100 MG/ML IJ SOLN: 100.0000 mg | Freq: Every day | INTRAMUSCULAR | Status: DC

## 2015-10-30 MED ORDER — HYDROXYZINE HCL 25 MG PO TABS: 25.0000 mg | ORAL_TABLET | Freq: Four times a day (QID) | ORAL | Status: DC | PRN

## 2015-10-30 MED ORDER — LORAZEPAM 1 MG PO TABS: 0.0000 mg | ORAL_TABLET | Freq: Two times a day (BID) | ORAL | Status: DC

## 2015-10-30 MED ORDER — LORAZEPAM 1 MG PO TABS
0.0000 mg | ORAL_TABLET | Freq: Four times a day (QID) | ORAL | Status: DC
  Administered 2015-10-30 – 2015-10-31 (×3): 1 mg via ORAL
  Filled 2015-10-30 (×3): qty 1

## 2015-10-30 MED ORDER — TRAZODONE HCL 50 MG PO TABS: 50.0000 mg | ORAL_TABLET | Freq: Every evening | ORAL | Status: DC | PRN

## 2015-10-30 MED ORDER — METFORMIN HCL 500 MG PO TABS
1000.0000 mg | ORAL_TABLET | Freq: Two times a day (BID) | ORAL | Status: DC
  Administered 2015-10-31: 1000 mg via ORAL
  Filled 2015-10-30 (×3): qty 2

## 2015-10-30 NOTE — ED Notes (Signed)
Pt ripped off his blood pressure cuff and cursed stating that it was too tight and squeezing his arm off. Will attempt to reassess.

## 2015-10-30 NOTE — ED Notes (Signed)
Food tray given to patient

## 2015-10-30 NOTE — ED Notes (Signed)
Pt taken to private room for TTS consult.

## 2015-10-30 NOTE — ED Notes (Signed)
Pt ambulated to the restroom with a steady gait and without assistance.

## 2015-10-30 NOTE — ED Provider Notes (Signed)
CSN: [id number]     Arrival date & time 10/30/15  1519 History   First MD Initiated Contact with Patient 10/30/15 1559     Chief Complaint  Patient presents with  . Alcohol Intoxication  . ETOH Detox       Patient is a 60 y.o. male presenting with intoxication. The history is provided by the patient.  Alcohol Intoxication Associated symptoms include abdominal pain. Pertinent negatives include no chest pain, no headaches and no shortness of breath.  Patient presents for treatment of his alcoholism. States he drinks everyday. States he had been clean for 5 months but recently has been drinking heavily. Denies suicidal thoughts and states depression. Last drink today. It is somewhat difficult to get the amount that he drinks out of them, but it appears to be about a case of beer a day. States he has had difficult withdrawal in the past. Denies seizures with withdrawal. Denies other intoxicants. Denies suicidal or homicidal thoughts.  Past Medical History  Diagnosis Date  . Diabetes mellitus   . Chronic hyponatremia     pt denies having   . Alcoholic ([place])   . Alcoholic cirrhosis of liver ([place])     Pt calls this a false diagnosis. Pt denies  . Thrombocytopenia ([place])   . Hyperglycemia   . Depression   . Anxiety   . Alcoholism /alcohol abuse ([place])   . Diabetes mellitus without complication ([place])   . Hepatitis C   . Neuropathy [facility])    Past Surgical History  Procedure Laterality Date  . Plate in lower right leg  2008  . Fracture surgery  right lower leg plate placed years ago   Family History  Problem Relation Age of Onset  . ALS Father    Social History  Substance Use Topics  . Smoking status: Current Every Day Smoker -- 1.00 packs/day for 40 years    Types: Cigarettes  . Smokeless tobacco: None  . Alcohol Use: Yes     Comment: 18/daily    Review of Systems  Constitutional: Positive for appetite change. Negative for activity change.  HENT: Negative for dental problem.    Eyes: Negative for pain.  Respiratory: Negative for chest tightness and shortness of breath.   Cardiovascular: Negative for chest pain and leg swelling.  Gastrointestinal: Positive for abdominal pain. Negative for nausea, vomiting, diarrhea and abdominal distention.  Genitourinary: Negative for flank pain.  Musculoskeletal: Negative for back pain and neck stiffness.  Skin: Negative for rash.  Neurological: Negative for weakness, numbness and headaches.  Psychiatric/Behavioral: Negative for behavioral problems and dysphoric mood.      Allergies  Benadryl; Benadryl; Sulfa antibiotics; Tegretol; Librium; and Sulfa antibiotics  Home Medications   Prior to Admission medications   Medication Sig Start Date End Date Taking? Authorizing Provider  gabapentin (NEURONTIN) 600 MG tablet Take 1 tablet (600 mg total) by mouth 3 (three) times daily. 04/24/15   Kerrie Buffalo, NP  hydrOXYzine (ATARAX/VISTARIL) 25 MG tablet Take 1 tablet (25 mg total) by mouth every 6 (six) hours as needed (anxiety/agitation or CIWA < or = 10). Patient not taking: Reported on 08/18/2015 04/24/15   Kerrie Buffalo, NP  metFORMIN (GLUCOPHAGE) 1000 MG tablet Take 1 tablet (1,000 mg total) by mouth 2 (two) times daily with a meal. 04/24/15   Kerrie Buffalo, NP  traZODone (DESYREL) 50 MG tablet Take 1 tablet (50 mg total) by mouth at bedtime as needed for sleep. Patient not taking: Reported on 08/18/2015 04/24/15  Kerrie Buffalo, NP   BP 160/87 mmHg  Pulse 104  Temp(Src) 98.2 F (36.8 C) (Oral)  Resp 18  SpO2 95% Physical Exam  Constitutional: He appears well-developed.  HENT:  Head: Atraumatic.  Eyes: EOM are normal.  Neck: Neck supple.  Cardiovascular: Normal rate.   Pulmonary/Chest: Effort normal.  Abdominal: Soft. There is no tenderness.  Musculoskeletal: He exhibits no edema.  Neurological: He is alert.  Patient appears somewhat intoxicated.  Skin: Skin is warm.    ED Course  Procedures (including  critical care time) Labs Review Labs Reviewed  COMPREHENSIVE METABOLIC PANEL - Abnormal; Notable for the following:    Sodium 132 (*)    Chloride 96 (*)    Glucose, Bld 328 (*)    Creatinine, Ser 0.53 (*)    Calcium 8.3 (*)    AST 267 (*)    ALT 200 (*)    Alkaline Phosphatase 167 (*)    All other components within normal limits  ETHANOL - Abnormal; Notable for the following:    Alcohol, Ethyl (B) 289 (*)    All other components within normal limits  CBC WITH DIFFERENTIAL/PLATELET - Abnormal; Notable for the following:    RBC 3.64 (*)    Hemoglobin 11.9 (*)    HCT 32.9 (*)    MCHC 36.2 (*)    Platelets 59 (*)    All other components within normal limits  URINE RAPID DRUG SCREEN, HOSP PERFORMED    Imaging Review No results found. I have personally reviewed and evaluated these images and lab results as part of my medical decision-making.   EKG Interpretation None      MDM   Final diagnoses:  Alcohol abuse    Patient with alcohol intoxication. Some depression but not suicidal. States he's had severe withdrawal past. Appears medically cleared and will be seen by TTS.    Davonna Belling, MD 10/31/15 [phone]

## 2015-10-30 NOTE — ED Notes (Signed)
Per EMS, [place], [place] bus [place], presents wanting ETOH detox.  Pt admits to drinking 2 40oz beers.  Pt reports that he was previously was able to quit "cold [place]" but relapsed after losing his job.  Sts he has been trying to stop, again, but is having difficulty.  Denies SI/HI/AVH.

## 2015-10-30 NOTE — BH Assessment (Signed)
Assessment completed.  Consulted with Priscille Loveless, NP who recommends treatment in the observation unit at this time.   Rosalin Hawking, LCSW Therapeutic Triage Specialist [place] 10/30/2015 10:16 PM

## 2015-10-30 NOTE — ED Notes (Signed)
Pt c/o abdominal pain d/t not eating for 3 days.
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
## 2015-10-31 ENCOUNTER — Observation Stay (HOSPITAL_COMMUNITY)
Admission: AD | Admit: 2015-10-31 | Discharge: 2015-11-01 | Disposition: A | Discharge status: Home / Self Care | Payer: Self-pay | Source: Intra-hospital | Attending: Psychiatry | Admitting: Psychiatry

## 2015-10-31 DIAGNOSIS — F10230 Alcohol dependence with withdrawal, uncomplicated: Secondary | ICD-10-CM

## 2015-10-31 DIAGNOSIS — F1994 Other psychoactive substance use, unspecified with psychoactive substance-induced mood disorder: Secondary | ICD-10-CM

## 2015-10-31 DIAGNOSIS — F419 Anxiety disorder, unspecified: Secondary | ICD-10-CM | POA: Insufficient documentation

## 2015-10-31 DIAGNOSIS — F1094 Alcohol use, unspecified with alcohol-induced mood disorder: Secondary | ICD-10-CM | POA: Diagnosis present

## 2015-10-31 DIAGNOSIS — E1140 Type 2 diabetes mellitus with diabetic neuropathy, unspecified: Secondary | ICD-10-CM | POA: Insufficient documentation

## 2015-10-31 DIAGNOSIS — B1920 Unspecified viral hepatitis C without hepatic coma: Secondary | ICD-10-CM | POA: Insufficient documentation

## 2015-10-31 DIAGNOSIS — Y908 Blood alcohol level of 240 mg/100 ml or more: Secondary | ICD-10-CM | POA: Insufficient documentation

## 2015-10-31 DIAGNOSIS — F17210 Nicotine dependence, cigarettes, uncomplicated: Secondary | ICD-10-CM | POA: Insufficient documentation

## 2015-10-31 DIAGNOSIS — F10239 Alcohol dependence with withdrawal, unspecified: Principal | ICD-10-CM | POA: Insufficient documentation

## 2015-10-31 LAB — CBG MONITORING, ED
Glucose-Capillary: 287 mg/dL — ABNORMAL HIGH (ref 65–99)
Glucose-Capillary: 278 mg/dL — ABNORMAL HIGH (ref 65–99)

## 2015-10-31 LAB — GLUCOSE, CAPILLARY
GLUCOSE-CAPILLARY: 277 mg/dL — AB (ref 65–99)
Glucose-Capillary: 406 mg/dL — ABNORMAL HIGH (ref 65–99)
Glucose-Capillary: 444 mg/dL — ABNORMAL HIGH (ref 65–99)

## 2015-10-31 MED ORDER — INSULIN GLARGINE 100 UNIT/ML ~~LOC~~ SOLN
30.0000 [IU] | Freq: Every day | SUBCUTANEOUS | Status: DC
  Filled 2015-10-31: qty 0.3

## 2015-10-31 MED ORDER — ACETAMINOPHEN 325 MG PO TABS
650.0000 mg | ORAL_TABLET | Freq: Four times a day (QID) | ORAL | Status: DC | PRN
  Administered 2015-10-31 – 2015-11-01 (×2): 650 mg via ORAL
  Filled 2015-10-31 (×2): qty 2

## 2015-10-31 MED ORDER — LORAZEPAM 1 MG PO TABS
0.0000 mg | ORAL_TABLET | Freq: Four times a day (QID) | ORAL | Status: DC
  Administered 2015-10-31 (×2): 1 mg via ORAL
  Administered 2015-11-01: 2 mg via ORAL
  Filled 2015-10-31: qty 2
  Filled 2015-10-31 (×3): qty 1

## 2015-10-31 MED ORDER — MAGNESIUM HYDROXIDE 400 MG/5ML PO SUSP: 30.0000 mL | Freq: Every day | ORAL | Status: DC | PRN

## 2015-10-31 MED ORDER — VITAMIN B-1 100 MG PO TABS
100.0000 mg | ORAL_TABLET | Freq: Every day | ORAL | Status: DC
  Administered 2015-11-01: 100 mg via ORAL
  Filled 2015-10-31: qty 1

## 2015-10-31 MED ORDER — ALUM & MAG HYDROXIDE-SIMETH 200-200-20 MG/5ML PO SUSP: 30.0000 mL | ORAL | Status: DC | PRN

## 2015-10-31 MED ORDER — METFORMIN HCL 500 MG PO TABS
1000.0000 mg | ORAL_TABLET | Freq: Two times a day (BID) | ORAL | Status: DC
  Administered 2015-11-01: 1000 mg via ORAL
  Filled 2015-10-31: qty 2

## 2015-10-31 MED ORDER — INSULIN GLARGINE 100 UNIT/ML ~~LOC~~ SOLN
30.0000 [IU] | Freq: Every day | SUBCUTANEOUS | Status: DC
  Administered 2015-10-31: 30 [IU] via SUBCUTANEOUS

## 2015-10-31 MED ORDER — GABAPENTIN 300 MG PO CAPS
600.0000 mg | ORAL_CAPSULE | Freq: Three times a day (TID) | ORAL | Status: DC
  Administered 2015-10-31 – 2015-11-01 (×3): 600 mg via ORAL
  Filled 2015-10-31 (×3): qty 2

## 2015-10-31 MED ORDER — LORAZEPAM 1 MG PO TABS: 0.0000 mg | ORAL_TABLET | Freq: Two times a day (BID) | ORAL | Status: DC

## 2015-10-31 MED ORDER — HYDROXYZINE HCL 25 MG PO TABS: 25.0000 mg | ORAL_TABLET | Freq: Four times a day (QID) | ORAL | Status: DC | PRN

## 2015-10-31 MED ORDER — INSULIN ASPART 100 UNIT/ML ~~LOC~~ SOLN
6.0000 [IU] | Freq: Once | SUBCUTANEOUS | Status: AC
  Administered 2015-10-31: 6 [IU] via SUBCUTANEOUS

## 2015-10-31 MED ORDER — TRAZODONE HCL 50 MG PO TABS
50.0000 mg | ORAL_TABLET | Freq: Every evening | ORAL | Status: DC | PRN
  Administered 2015-10-31: 50 mg via ORAL
  Filled 2015-10-31: qty 1

## 2015-10-31 MED ORDER — INSULIN ASPART 100 UNIT/ML ~~LOC~~ SOLN
8.0000 [IU] | Freq: Once | SUBCUTANEOUS | Status: AC
  Administered 2015-10-31: 8 [IU] via SUBCUTANEOUS

## 2015-10-31 NOTE — Progress Notes (Signed)
D:Patient has been in resting tonight.  Patient denies SI/HI and denies AVH.  Patient states he has not been taking caare of himself and states his diet has not been good.  Patient states he wants to detox and be safe. A: Staff to monitor Q 15 mins for safety.  Encouragement and support offered.  Scheduled medications administered per orders. R: Patient remains safe on the unit. Patient taking administered medications.

## 2015-10-31 NOTE — Progress Notes (Signed)
Patient blood sugar checked tonight and it was 406.  Serena Colonel notified and new orders received.  Will recheck blood sugar in one hour and also administered Lantus after 2200.  Patient is asymptomatic.

## 2015-10-31 NOTE — BH Assessment (Signed)
Fuller Plan, [facility] considering patient for the observation unit. Requests repeat glucose and vital signs. Informed patients nurse of request.   Rosalin Hawking, LCSW Therapeutic Triage Specialist [place] 10/31/2015 1:11 AM

## 2015-10-31 NOTE — ED Notes (Signed)
Pt refusing to stay still for vital signs. He states the the blood pressure cuff squeezes too tight.

## 2015-10-31 NOTE — Progress Notes (Signed)
Pt confirms he is seen by Benita Gutter at [facility] for medical services   CM discussed and provided written information to assist pt with determining choice for uninsured accepting pcps, discussed the importance of pcp vs EDP services for f/u care, www.needymeds.org, www.goodrx.com, discounted pharmacies and other [employer] such as [employer], affordable care act, financial assistance, uninsured dental services, [place] med assist, DSS and  health department  Reviewed resources for [employer] uninsured accepting pcps like [facility], family medicine at [employer], [facility], [facility], local urgent care centers, [facility], [facility] family practice, general medical clinics, family services of the [place], [facility] urgent care plus others, medication resources, [facility] out patient pharmacies and housing Pt voiced understanding and appreciation of resources provided   Provided [facility] contact information

## 2015-10-31 NOTE — Progress Notes (Signed)
Inpatient Diabetes Program Recommendations  AACE/ADA: New Consensus Statement on Inpatient Glycemic Control (2015)  Target Ranges:  Prepandial:   less than 140 mg/dL      Peak postprandial:   less than 180 mg/dL (1-2 hours)      Critically ill patients:  140 - 180 mg/dL   Lab Results  Component Value Date   GLUCAP 278* 10/31/2015   HGBA1C 9.6* 09/13/2013   Results for [name], ALVIZO (MRN [id number]) as of 10/31/2015 15:54  Ref. Range 10/31/2015 01:13 10/31/2015 08:43  Glucose-Capillary Latest Ref Range: 65-99 mg/dL 287 (H) 278 (H)   Review of Glycemic Control  Diabetes history: DM2 Outpatient Diabetes medications: Lantus 30 units QHS, metformin 1000 mg bid Current orders for Inpatient glycemic control: Lantus 30 units QHS, metformin 1000 mg bid  Inpatient Diabetes Program Recommendations:    Please consider addition of Novolog moderate tidwc and hs. Change diet to CHO mod med.  Will follow. Thank you. Lorenda Peck, RD, LDN, CDE Inpatient Diabetes Coordinator [phone]

## 2015-10-31 NOTE — Tx Team (Signed)
Initial Interdisciplinary Treatment Plan   PATIENT STRESSORS: Substance abuse   PATIENT STRENGTHS: none noted on admission   PROBLEM LIST: Problem List/Patient Goals Date to be addressed Date deferred Reason deferred Estimated date of resolution  ETOH abuse                                                       DISCHARGE CRITERIA:  Adequate post-discharge living arrangements Withdrawal symptoms are absent or subacute and managed without 24-hour nursing intervention  PRELIMINARY DISCHARGE PLAN: Outpatient therapy  PATIENT/FAMIILY INVOLVEMENT: This treatment plan has been presented to and reviewed with the patient, [name], and/or family member, pt.  The patient and family have been given the opportunity to ask questions and make suggestions.  Oretha Milch 10/31/2015, 6:02 PM

## 2015-10-31 NOTE — ED Notes (Signed)
[facility] called for transportation to [facility].

## 2015-10-31 NOTE — Progress Notes (Signed)
Patient blood glucose rechecked and it was 444. Patient ate gold fish tonight. Serena Colonel notifed. New orders received. Will recheck blood sugar in 45 mins to 1 hour.

## 2015-10-31 NOTE — Progress Notes (Signed)
Patient ID: [name], male   DOB: 05-25-1955, 60 y.o.   MRN: [id number] ADMISSION  NOTE  --  60 year old homeless male admitted voluntarily to [facility] observation unit.  Pt. Is dealing with ETOH withdrawal.  Pt. Was picked up by EMS at a local bus stop after being notified of a man down.  Pt. Denies any SI or HI. HA Pt. Is concerned that he will lose his job due to this relapse.   Pt. Lives alone in homeless shelter.  Pt. hs HX of high B/P and blood glucose.  He has been ordered Lantus in the ED.  Pt. 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   Pt. Is allergic to several medications , including Librium.  Pt. receives Ativan instead.    Pt. Has HX of Hep C, liver chirosies,  chronic Hyponatremia.  Pt. Appears to be in poor general health and very disheveled.  On admission, he was anxious , demanding , med seeking and irritable.  Pt. Agreed to contract for safety while at [facility]

## 2015-10-31 NOTE — BH Assessment (Signed)
[name] Assessment Progress Note  Per Corena Pilgrim, MD, this pt would benefit from admission to the [facility] Observation Unit at this time.  [name], RN, [facility] has assigned pt to Obs 6.  Pt has signed Voluntary Admission and Consent for Treatment, as well as Consent to Release Information to [place] [facility], and signed forms have been faxed to [facility].  Pt's nurse has been notified, and agrees to send original paperwork along with pt via [facility], and to call report to [phone] or [phone].  Jalene Mullet, [place] Triage Specialist [phone]

## 2015-10-31 NOTE — Consult Note (Signed)
[place] [facility] Consult   Reason for Consult:  Alcohol abuse with suicidal ideations Referring Physician:  EDP Patient Identification: [name] MRN:  [id number] Principal Diagnosis: Alcohol-induced mood disorder [facility]) Diagnosis:   Patient Active Problem List   Diagnosis Date Noted  . Alcohol-induced mood disorder ([place]) [F10.94] 10/31/2015    Priority: High  . Alcohol dependence with uncomplicated withdrawal ([place]) [F10.230] 03/03/2015    Priority: High  . Suicidal ideation [R45.851] 11/20/2014    Priority: High  . Substance induced mood disorder ([place]) [F19.94] 06/20/2013    Priority: High  . Alcohol dependence with acute alcoholic intoxication ([place]) [F10.229] 11/06/2011    Priority: High  . Moderate major depression ([place]) [F32.1] 04/21/2015  . Major depressive disorder, single episode, moderate ([place]) [F32.1] 03/10/2015  . Alcohol abuse [F10.10]   . Suicidal ideation [R45.851]   . Anxiousness [F41.9] 09/11/2013  . S/P alcohol detoxification [Z09] 09/27/2012  . Alcohol dependence ([place]) [F10.20] 01/29/2012  . Type II or unspecified type diabetes mellitus without mention of complication, uncontrolled [E11.65] 11/11/2011  . Thrombocytopenia ([place]) [D69.6]   . Hyperglycemia [R73.9]   . Alcoholic ([place]) [Q25.95]   . Chronic hyponatremia [E87.1]   . Diabetes mellitus ([place]) [E11.9] 11/06/2011  . Hepatitis C [B19.20] 11/06/2011    Total Time spent with patient: 45 minutes  Subjective:   [name] is a 60 y.o. male patient admitted with alcohol dependence and depression.  HPI:  60 yo male who presented with suicidal ideations after drinking.  Today, he denies suicidal/homicidal ideations, hallucinations, and withdrawal symptoms.  He would like to go to obs and then to [facility] for rehab.  Pleasant and cooperative.    Past Psychiatric History: alcohol abuse  Risk to Self: Suicidal Ideation: No Suicidal Intent: No Is patient at risk for suicide?: No Suicidal  Plan?: No Access to Means: No What has been your use of drugs/alcohol within the last 12 months?: alcohol past two weeks How many times?: 0 Other Self Harm Risks: Denies Triggers for Past Attempts: None known Intentional Self Injurious Behavior: None Risk to Others: Homicidal Ideation: No Thoughts of Harm to Others: No Current Homicidal Intent: No Current Homicidal Plan: No Access to Homicidal Means: No Identified Victim: Denies History of harm to others?: No Assessment of Violence: None Noted Violent Behavior Description: Denies Does patient have access to weapons?: No Criminal Charges Pending?: No Does patient have a court date: No Prior Inpatient Therapy: Prior Inpatient Therapy: No Prior Therapy Dates: N/A Prior Therapy Facilty/Provider(s): N/A Reason for Treatment: N/A Prior Outpatient Therapy: Prior Outpatient Therapy: No Prior Therapy Dates: N/A Prior Therapy Facilty/Provider(s): N/A Reason for Treatment: N/A Does patient have an ACCT team?: No Does patient have Intensive In-House Services?  : No Does patient have [facility] services? : No Does patient have P4CC services?: No  Past Medical History:  Past Medical History  Diagnosis Date  . Diabetes mellitus   . Chronic hyponatremia     pt denies having   . Alcoholic ([place])   . Alcoholic cirrhosis of liver ([place])     Pt calls this a false diagnosis. Pt denies  . Thrombocytopenia ([place])   . Hyperglycemia   . Depression   . Anxiety   . Alcoholism /alcohol abuse ([place])   . Diabetes mellitus without complication ([place])   . Hepatitis C   . Neuropathy [facility])     Past Surgical History  Procedure Laterality Date  . Plate in lower right leg  2008  . Fracture surgery  right lower leg plate placed years ago   Family History:  Family History  Problem Relation Age of Onset  . ALS Father    Family Psychiatric  History: none Social History:  History  Alcohol Use  . Yes    Comment: 19/daily     History  Drug Use No     Social History   Social History  . Marital Status: Divorced    Spouse Name: N/A  . Number of Children: N/A  . Years of Education: N/A   Social History Main Topics  . Smoking status: Current Every Day Smoker -- 1.00 packs/day for 40 years    Types: Cigarettes  . Smokeless tobacco: None  . Alcohol Use: Yes     Comment: 18/daily  . Drug Use: No  . Sexual Activity: Not Currently   Other Topics Concern  . None   Social History Narrative   ** Merged History Encounter **       Additional Social History:    Allergies:   Allergies  Allergen Reactions  . Benadryl [Diphenhydramine Hcl] Other (See Comments)    "Causes nervousness"  . Benadryl [Diphenhydramine] Other (See Comments)    "makes my skin crawl"  . Sulfa Antibiotics Other (See Comments)    Childhood allergy  . Tegretol [Carbamazepine] Other (See Comments)    "almost killed me"  . Librium [Chlordiazepoxide Hcl] Anxiety  . Sulfa Antibiotics Rash    Labs:  Results for orders placed or performed during the hospital encounter of 10/30/15 (from the past 48 hour(s))  Comprehensive metabolic panel     Status: Abnormal   Collection Time: 10/30/15  4:33 PM  Result Value Ref Range   Sodium 132 (L) 135 - 145 mmol/L   Potassium 4.1 3.5 - 5.1 mmol/L   Chloride 96 (L) 101 - 111 mmol/L   CO2 27 22 - 32 mmol/L   Glucose, Bld 328 (H) 65 - 99 mg/dL   BUN 9 6 - 20 mg/dL   Creatinine, Ser 0.53 (L) 0.61 - 1.24 mg/dL   Calcium 8.3 (L) 8.9 - 10.3 mg/dL   Total Protein 7.3 6.5 - 8.1 g/dL   Albumin 3.6 3.5 - 5.0 g/dL   AST 267 (H) 15 - 41 U/L   ALT 200 (H) 17 - 63 U/L   Alkaline Phosphatase 167 (H) 38 - 126 U/L   Total Bilirubin 0.9 0.3 - 1.2 mg/dL   GFR calc non Af Amer >60 >60 mL/min   GFR calc Af Amer >60 >60 mL/min    Comment: (NOTE) The eGFR has been calculated using the CKD EPI equation. This calculation has not been validated in all clinical situations. eGFR's persistently <60 mL/min signify possible Chronic  Kidney Disease.    Anion gap 9 5 - 15  Ethanol     Status: Abnormal   Collection Time: 10/30/15  4:33 PM  Result Value Ref Range   Alcohol, Ethyl (B) 289 (H) <5 mg/dL    Comment:        LOWEST DETECTABLE LIMIT FOR SERUM ALCOHOL IS 5 mg/dL FOR MEDICAL PURPOSES ONLY   CBC with Differential     Status: Abnormal   Collection Time: 10/30/15  4:33 PM  Result Value Ref Range   WBC 4.1 4.0 - 10.5 K/uL   RBC 3.64 (L) 4.22 - 5.81 MIL/uL   Hemoglobin 11.9 (L) 13.0 - 17.0 g/dL   HCT 32.9 (L) 39.0 - 52.0 %   MCV 90.4 78.0 - 100.0 fL   MCH  32.7 26.0 - 34.0 pg   MCHC 36.2 (H) 30.0 - 36.0 g/dL   RDW 13.6 11.5 - 15.5 %   Platelets 59 (L) 150 - 400 K/uL    Comment: CONSISTENT WITH PREVIOUS RESULT   Neutrophils Relative % 42 %   Neutro Abs 1.7 1.7 - 7.7 K/uL   Lymphocytes Relative 48 %   Lymphs Abs 2.0 0.7 - 4.0 K/uL   Monocytes Relative 8 %   Monocytes Absolute 0.3 0.1 - 1.0 K/uL   Eosinophils Relative 2 %   Eosinophils Absolute 0.1 0.0 - 0.7 K/uL   Basophils Relative 1 %   Basophils Absolute 0.0 0.0 - 0.1 K/uL  Urine rapid drug screen (hosp performed)     Status: None   Collection Time: 10/30/15  5:58 PM  Result Value Ref Range   Opiates NONE DETECTED NONE DETECTED   Cocaine NONE DETECTED NONE DETECTED   Benzodiazepines NONE DETECTED NONE DETECTED   Amphetamines NONE DETECTED NONE DETECTED   Tetrahydrocannabinol NONE DETECTED NONE DETECTED   Barbiturates NONE DETECTED NONE DETECTED    Comment:        DRUG SCREEN FOR MEDICAL PURPOSES ONLY.  IF CONFIRMATION IS NEEDED FOR ANY PURPOSE, NOTIFY LAB WITHIN 5 DAYS.        LOWEST DETECTABLE LIMITS FOR URINE DRUG SCREEN Drug Class       Cutoff (ng/mL) Amphetamine      1000 Barbiturate      200 Benzodiazepine   540 Tricyclics       086 Opiates          300 Cocaine          300 THC              50   POC CBG, ED     Status: Abnormal   Collection Time: 10/31/15  1:13 AM  Result Value Ref Range   Glucose-Capillary 287 (H) 65 - 99  mg/dL  POC CBG, ED     Status: Abnormal   Collection Time: 10/31/15  8:43 AM  Result Value Ref Range   Glucose-Capillary 278 (H) 65 - 99 mg/dL    Current Facility-Administered Medications  Medication Dose Route Frequency Provider Last Rate Last Dose  . gabapentin (NEURONTIN) capsule 600 mg  600 mg Oral TID Davonna Belling, MD   600 mg at 10/31/15 1211  . hydrOXYzine (ATARAX/VISTARIL) tablet 25 mg  25 mg Oral Q6H PRN Davonna Belling, MD      . LORazepam (ATIVAN) tablet 0-4 mg  0-4 mg Oral Q6H Davonna Belling, MD   1 mg at 10/31/15 1212   Followed by  . [START ON 11/01/2015] LORazepam (ATIVAN) tablet 0-4 mg  0-4 mg Oral Q12H Davonna Belling, MD      . metFORMIN (GLUCOPHAGE) tablet 1,000 mg  1,000 mg Oral BID WC Davonna Belling, MD   1,000 mg at 10/31/15 0847  . thiamine (VITAMIN B-1) tablet 100 mg  100 mg Oral Daily Davonna Belling, MD   100 mg at 10/31/15 1212  . traZODone (DESYREL) tablet 50 mg  50 mg Oral QHS PRN Davonna Belling, MD       Current Outpatient Prescriptions  Medication Sig Dispense Refill  . gabapentin (NEURONTIN) 600 MG tablet Take 1 tablet (600 mg total) by mouth 3 (three) times daily. 9 tablet 0  . insulin glargine (LANTUS) 100 UNIT/ML injection Inject 30 Units into the skin at bedtime.    . metFORMIN (GLUCOPHAGE) 1000 MG tablet Take 1 tablet (1,000 mg  total) by mouth 2 (two) times daily with a meal. 6 tablet 0    Musculoskeletal: Strength & Muscle Tone: within normal limits Gait & Station: normal Patient leans: N/A  Psychiatric Specialty Exam: Physical Exam  Constitutional: He is oriented to person, place, and time. He appears well-developed and well-nourished.  HENT:  Head: Normocephalic.  Neck: Normal range of motion.  Respiratory: Effort normal.  Musculoskeletal: Normal range of motion.  Neurological: He is alert and oriented to person, place, and time.  Skin: Skin is warm and dry.  Psychiatric: His speech is normal and behavior is normal. Judgment  and thought content normal. Cognition and memory are normal. He exhibits a depressed mood.    Review of Systems  Constitutional: Negative.   HENT: Negative.   Eyes: Negative.   Respiratory: Negative.   Cardiovascular: Negative.   Gastrointestinal: Negative.   Genitourinary: Negative.   Musculoskeletal: Negative.   Skin: Negative.   Neurological: Negative.   Endo/Heme/Allergies: Negative.   Psychiatric/Behavioral: Positive for depression and substance abuse.    Blood pressure 168/96, pulse 92, temperature 98.2 F (36.8 C), temperature source Oral, resp. rate 16, SpO2 95 %.There is no weight on file to calculate BMI.  General Appearance: Disheveled  Eye Contact:  Fair  Speech:  Normal Rate  Volume:  Normal  Mood:  Depressed  Affect:  Congruent  Thought Process:  Coherent and Descriptions of Associations: Intact  Orientation:  Full (Time, Place, and Person)  Thought Content:  WDL  Suicidal Thoughts:  No  Homicidal Thoughts:  No  Memory:  Immediate;   Fair Recent;   Fair Remote;   Fair  Judgement:  Fair  Insight:  Fair  Psychomotor Activity:  Decreased  Concentration:  Concentration: Fair and Attention Span: Fair  Recall:  [employer] of Knowledge:  Fair  Language:  Fair  Akathisia:  No  Handed:  Right  AIMS (if indicated):     Assets:  Leisure Time Physical Health Resilience  ADL's:  Intact  Cognition:  WNL  Sleep:        Treatment Plan Summary: Daily contact with patient to assess and evaluate symptoms and progress in treatment, Medication management and Plan alcohol induced mood disorder:  -Crisis stabilization -Medication management:  Ativan alcohol detox protocol started along with his medical medications and gabapentin 600 mg TID for withdrawal symptoms. -Individual and substance abuse counseling  Disposition: Supportive therapy provided about ongoing stressors.  Waylan Boga, NP 10/31/2015 1:36 PM Patient seen face-to-face for psychiatric evaluation,  chart reviewed and case discussed with the physician extender and developed treatment plan. Reviewed the information documented and agree with the treatment plan. Corena Pilgrim, MD

## 2015-11-01 DIAGNOSIS — F10230 Alcohol dependence with withdrawal, uncomplicated: Secondary | ICD-10-CM

## 2015-11-01 DIAGNOSIS — F1094 Alcohol use, unspecified with alcohol-induced mood disorder: Secondary | ICD-10-CM

## 2015-11-01 LAB — GLUCOSE, CAPILLARY
Glucose-Capillary: 128 mg/dL — ABNORMAL HIGH (ref 65–99)
Glucose-Capillary: 296 mg/dL — ABNORMAL HIGH (ref 65–99)

## 2015-11-01 MED ORDER — LORAZEPAM 1 MG PO TABS: 1.0000 mg | ORAL_TABLET | Freq: Every day | ORAL | Status: DC

## 2015-11-01 MED ORDER — INSULIN ASPART 100 UNIT/ML ~~LOC~~ SOLN
6.0000 [IU] | Freq: Once | SUBCUTANEOUS | Status: AC
  Administered 2015-11-01: 6 [IU] via SUBCUTANEOUS

## 2015-11-01 MED ORDER — LORAZEPAM 1 MG PO TABS: 1.0000 mg | ORAL_TABLET | Freq: Three times a day (TID) | ORAL | Status: DC

## 2015-11-01 MED ORDER — METFORMIN HCL 1000 MG PO TABS: 1000.0000 mg | ORAL_TABLET | Freq: Two times a day (BID) | ORAL | Status: DC

## 2015-11-01 MED ORDER — LORAZEPAM 1 MG PO TABS: 1.0000 mg | ORAL_TABLET | Freq: Two times a day (BID) | ORAL | Status: DC

## 2015-11-01 MED ORDER — GABAPENTIN 600 MG PO TABS: 600.0000 mg | ORAL_TABLET | Freq: Three times a day (TID) | ORAL | Status: DC

## 2015-11-01 MED ORDER — INSULIN GLARGINE 100 UNIT/ML ~~LOC~~ SOLN: 30.0000 [IU] | Freq: Every day | SUBCUTANEOUS | Status: DC

## 2015-11-01 MED ORDER — LORAZEPAM 1 MG PO TABS
1.0000 mg | ORAL_TABLET | Freq: Four times a day (QID) | ORAL | Status: DC
  Administered 2015-11-01 (×2): 1 mg via ORAL
  Filled 2015-11-01 (×2): qty 1

## 2015-11-01 NOTE — [facility] Suicide Risk Assessment (Addendum)
Suicide Risk Assessment  Discharge Assessment   [facility] Discharge Suicide Risk Assessment   Principal Problem: alcohol dependence with uncomplicated withdrawal Discharge Diagnoses:  Patient Active Problem List   Diagnosis Date Noted  . Alcohol dependence with uncomplicated withdrawal ([place]) [F10.230] 03/03/2015    Priority: High  . Suicidal ideation [R45.851] 11/20/2014    Priority: High  . Substance induced mood disorder ([place]) [F19.94] 06/20/2013    Priority: High  . Alcohol dependence with acute alcoholic intoxication ([place]) [F10.229] 11/06/2011    Priority: High  . Alcohol-induced mood disorder ([place]) [F10.94] 10/31/2015  . Moderate major depression ([place]) [F32.1] 04/21/2015  . Major depressive disorder, single episode, moderate ([place]) [F32.1] 03/10/2015  . Alcohol abuse [F10.10]   . Suicidal ideation [R45.851]   . Anxiousness [F41.9] 09/11/2013  . S/P alcohol detoxification [Z09] 09/27/2012  . Alcohol dependence ([place]) [F10.20] 01/29/2012  . Type II or unspecified type diabetes mellitus without mention of complication, uncontrolled [E11.65] 11/11/2011  . Thrombocytopenia ([place]) [D69.6]   . Hyperglycemia [R73.9]   . Alcoholic ([place]) [Z12.45]   . Chronic hyponatremia [E87.1]   . Diabetes mellitus ([name]) [E11.9] 11/06/2011  . Hepatitis C [B19.20] 11/06/2011    Total Time spent with patient: 45 minutes  Musculoskeletal: Strength & Muscle Tone: within normal limits Gait & Station: normal Patient leans: N/A  Psychiatric Specialty Exam: Physical Exam  Constitutional: He is oriented to person, place, and time. He appears well-developed and well-nourished.  HENT:  Head: Normocephalic.  Neck: Normal range of motion.  Respiratory: Effort normal.  Musculoskeletal: Normal range of motion.  Neurological: He is alert and oriented to person, place, and time.  Skin: Skin is warm and dry.  Psychiatric: He has a normal mood and affect. His speech is normal and behavior is normal. Judgment and  thought content normal. Cognition and memory are normal.    Review of Systems  Constitutional: Negative.   HENT: Negative.   Eyes: Negative.   Respiratory: Negative.   Cardiovascular: Negative.   Gastrointestinal: Negative.   Genitourinary: Negative.   Musculoskeletal: Negative.   Skin: Negative.   Neurological: Negative.   Endo/Heme/Allergies: Negative.   Psychiatric/Behavioral: Positive for substance abuse.    Blood pressure 139/81, pulse 94, temperature 98.1 F (36.7 C), temperature source Oral, resp. rate 18, height 6' 0.5" (1.842 m), weight 72.576 kg (160 lb), SpO2 95 %.Body mass index is 21.39 kg/(m^2).  General Appearance: Casual  Eye Contact:  Good  Speech:  Normal Rate  Volume:  Normal  Mood:  Euthymic  Affect:  Congruent  Thought Process:  Coherent and Descriptions of Associations: Intact  Orientation:  Full (Time, Place, and Person)  Thought Content:  WDL  Suicidal Thoughts:  No  Homicidal Thoughts:  No  Memory:  Immediate;   Good Recent;   Good Remote;   Good  Judgement:  Fair  Insight:  Fair  Psychomotor Activity:  Normal  Concentration:  Concentration: Good and Attention Span: Good  Recall:  Good  Fund of Knowledge:  Good  Language:  Good  Akathisia:  No  Handed:  Right  AIMS (if indicated):     Assets:  Leisure Time Physical Health Resilience  ADL's:  Intact  Cognition:  WNL  Sleep:       Mental Status Per Nursing Assessment::   On Admission:  Alcohol dependence  Demographic Factors:  Male and Caucasian  Loss Factors: NA  Historical Factors: NA  Risk Reduction Factors:   Sense of responsibility to family  Continued Clinical Symptoms:  None  Cognitive Features That Contribute To Risk:  None    Suicide Risk:  Minimal: No identifiable suicidal ideation.  Patients presenting with no risk factors but with morbid ruminations; may be classified as minimal risk based on the severity of the depressive symptoms    Plan Of Care/Follow-up  recommendations:  Activity:  as tolerated Diet:  heart heatlhy diet  LORD, JAMISON, NP 11/01/2015, 11:25 AM

## 2015-11-01 NOTE — BHH Counselor (Signed)
Per Patrecia Pour, NP patient will be discharged this afternoon with OPT resources. Pt will be given a bus pass upon discharge.   Redmond Pulling, MA OBS Counselor

## 2015-11-01 NOTE — Progress Notes (Addendum)
D: Pt d/c as per MD's order. Bus pass,  list of outpatient resources and a list of homeless shelters was given to pt at time of departure from facility.  A: Scheduled medications administered as prescribed. D/C instructions reviewed with pt. Support and encouragement provided. All belongings in locker 47 returned to pt at time of departure. Constant visual observation maintained without gestures of self harm or outburst to note at this time.  R: Pt receptive to care. Denies SI, HI, AVH and pain at time of d/c. Verbalized understanding related to d/c instructions. Compliant with scheduled medications when offered; denies adverse drug reactions. Signed belonging sheet in agreement with items received. Vitals stable without physical distress at time this time.

## 2015-11-01 NOTE — Discharge Summary (Signed)
Physician Discharge Summary Note  Patient:  [name] is an 60 y.o., male MRN:  [id number] DOB:  1955/09/20 Patient phone:  [phone] (home)  Patient address:   [address] [place] [address],  Total Time spent with patient: 45 minutes  Date of Admission:  10/31/2015 Date of Discharge: 11/01/2015  Reason for Admission:  Alcohol dependence  Principal Problem: alcohol dependence with uncomplicated withdrawal Discharge Diagnoses: Patient Active Problem List   Diagnosis Date Noted  . Alcohol dependence with uncomplicated withdrawal ([place]) [F10.230] 03/03/2015    Priority: High  . Suicidal ideation [R45.851] 11/20/2014    Priority: High  . Substance induced mood disorder ([place]) [F19.94] 06/20/2013    Priority: High  . Alcohol dependence with acute alcoholic intoxication ([place]) [F10.229] 11/06/2011    Priority: High  . Alcohol-induced mood disorder ([place]) [F10.94] 10/31/2015  . Moderate major depression ([place]) [F32.1] 04/21/2015  . Major depressive disorder, single episode, moderate ([place]) [F32.1] 03/10/2015  . Alcohol abuse [F10.10]   . Suicidal ideation [R45.851]   . Anxiousness [F41.9] 09/11/2013  . S/P alcohol detoxification [Z09] 09/27/2012  . Alcohol dependence ([place]) [F10.20] 01/29/2012  . Type II or unspecified type diabetes mellitus without mention of complication, uncontrolled [E11.65] 11/11/2011  . Thrombocytopenia ([place]) [D69.6]   . Hyperglycemia [R73.9]   . Alcoholic ([place]) [V78.58]   . Chronic hyponatremia [E87.1]   . Diabetes mellitus ([place]) [E11.9] 11/06/2011  . Hepatitis C [B19.20] 11/06/2011    Past Psychiatric History: alcohol dependence  Past Medical History:  Past Medical History  Diagnosis Date  . Diabetes mellitus   . Chronic hyponatremia     pt denies having   . Alcoholic ([place])   . Alcoholic cirrhosis of liver ([place])     Pt calls this a false diagnosis. Pt denies  . Thrombocytopenia ([place])   . Hyperglycemia   . Depression   . Anxiety   .  Alcoholism /alcohol abuse ([place])   . Diabetes mellitus without complication ([place])   . Hepatitis C   . Neuropathy [facility])     Past Surgical History  Procedure Laterality Date  . Plate in lower right leg  2008  . Fracture surgery  right lower leg plate placed years ago   Family History:  Family History  Problem Relation Age of Onset  . ALS Father    Family Psychiatric  History: none Social History:  History  Alcohol Use  . Yes    Comment: 18/daily     History  Drug Use No    Social History   Social History  . Marital Status: Divorced    Spouse Name: N/A  . Number of Children: N/A  . Years of Education: N/A   Social History Main Topics  . Smoking status: Current Every Day Smoker -- 1.00 packs/day for 40 years    Types: Cigarettes  . Smokeless tobacco: Not on file  . Alcohol Use: Yes     Comment: 18/daily  . Drug Use: No  . Sexual Activity: Not Currently   Other Topics Concern  . Not on file   Social History Narrative   ** Merged History [facility] Course:  On admission:   60 y.o. male presenting voluntarily to [facility] requesting detox. Patient states "there is no way I can do it without the proper medication" Patient denies SI and history of attempts. Patient denies self injurious behaviors. Patient denies HI and history of aggression. Patient denies pending charges and upcoming  court dates. Patient denies access to firearms. Patient reports that he is on probation for "clerical stuff." Patient denies AVH and does not appear to be responding to internal stimuli during the assessment. Patient denies previous treatment for mental illness but states that he has been treated at [facility] "about six times" for substance abuse treatment. Patient reports his last admission was "about two years ago. Patient reports that he would like to go to [facility] for treatment and that is why he came in today.   Patient reports that he was sober for five months until his wages were  garnished three weeks ago and he decided to leave his job. Patient states that he "cannot work for free" and states "I was doing good until [place] me up." Patient states that he has been drinking "all day every day" for the past two weeks and cannot recall a specific amount. Patient states that he last drank about 3 forty ounce beers tonight. Patient denies use of drugs. Patient BAL 289 upon arrival and UDS clear at time of assessment.   Today, patient has met maximum benefit of hospitalization and deemed stable for discharge as he denies suicidal/homicidal ideations, hallucinations, and withdrawal symptoms.  He was started on the Ativan alcohol detox protocol along with gabapentin 600 mg TID for withdrawal symptoms and Trazodone 50 mg PRN at bedtime for sleep.  Referred to [facility] after discharge, bed availability on Monday.  Stable for discharge.    Physical Findings: AIMS: Facial and Oral Movements Muscles of Facial Expression: None, normal Lips and Perioral Area: None, normal Jaw: None, normal Tongue: None, normal,Extremity Movements Upper (arms, wrists, hands, fingers): None, normal Lower (legs, knees, ankles, toes): None, normal, Trunk Movements Neck, shoulders, hips: None, normal, Overall Severity Severity of abnormal movements (highest score from questions above): None, normal Incapacitation due to abnormal movements: None, normal Patient's awareness of abnormal movements (rate only patient's report): No Awareness, Dental Status Current problems with teeth and/or dentures?: No Does patient usually wear dentures?: No  CIWA:  CIWA-Ar Total: 13 COWS:     Musculoskeletal: Strength & Muscle Tone: within normal limits Gait & Station: normal Patient leans: N/A  Psychiatric Specialty Exam: Physical Exam  Constitutional: He is oriented to person, place, and time. He appears well-developed and well-nourished.  HENT:  Head: Normocephalic.  Neck: Normal range of  motion.  Respiratory: Effort normal.  Musculoskeletal: Normal range of motion.  Neurological: He is alert and oriented to person, place, and time.  Skin: Skin is warm and dry.  Psychiatric: He has a normal mood and affect. His speech is normal and behavior is normal. Judgment and thought content normal. Cognition and memory are normal.    Review of Systems  Constitutional: Negative.   HENT: Negative.   Eyes: Negative.   Respiratory: Negative.   Cardiovascular: Negative.   Gastrointestinal: Negative.   Genitourinary: Negative.   Musculoskeletal: Negative.   Skin: Negative.   Neurological: Negative.   Endo/Heme/Allergies: Negative.   Psychiatric/Behavioral: Positive for substance abuse.    Blood pressure 139/81, pulse 94, temperature 98.1 F (36.7 C), temperature source Oral, resp. rate 18, height 6' 0.5" (1.842 m), weight 72.576 kg (160 lb), SpO2 95 %.Body mass index is 21.39 kg/(m^2).  General Appearance: Casual  Eye Contact:  Good  Speech:  Normal Rate  Volume:  Normal  Mood:  Euthymic  Affect:  Congruent  Thought Process:  Coherent and Descriptions of Associations: Intact  Orientation:  Full (Time, Place, and  Person)  Thought Content:  WDL  Suicidal Thoughts:  No  Homicidal Thoughts:  No  Memory:  Immediate;   Good Recent;   Good Remote;   Good  Judgement:  Fair  Insight:  Fair  Psychomotor Activity:  Normal  Concentration:  Concentration: Good and Attention Span: Good  Recall:  Good  Fund of Knowledge:  Good  Language:  Good  Akathisia:  No  Handed:  Right  AIMS (if indicated):     Assets:  Leisure Time Physical Health Resilience  ADL's:  Intact  Cognition:  WNL  Sleep:        Have you used any form of tobacco in the last 30 days? (Cigarettes, Smokeless Tobacco, Cigars, and/or Pipes): Yes  Has this patient used any form of tobacco in the last 30 days? (Cigarettes, Smokeless Tobacco, Cigars, and/or Pipes) Yes, Yes, A prescription for an FDA-approved tobacco  cessation medication was offered at discharge and the patient refused  Blood Alcohol level:  Lab Results  Component Value Date   ETH 289* 10/30/2015   ETH <5 69/62/9528    Metabolic Disorder Labs:  Lab Results  Component Value Date   HGBA1C 9.6* 09/13/2013   MPG 229* 09/13/2013   MPG 235* 06/20/2013   No results found for: PROLACTIN No results found for: CHOL, TRIG, HDL, CHOLHDL, VLDL, LDLCALC  See Psychiatric Specialty Exam and Suicide Risk Assessment completed by Attending Physician prior to discharge.  Discharge destination:  Home  Is patient on multiple antipsychotic therapies at discharge:  No   Has Patient had three or more failed trials of antipsychotic monotherapy by history:  No  Recommended Plan for Multiple Antipsychotic Therapies: NA     Medication List    ASK your doctor about these medications      Indication   gabapentin 600 MG tablet  Commonly known as:  NEURONTIN  Take 1 tablet (600 mg total) by mouth 3 (three) times daily.   Indication:  Alcohol Withdrawal Syndrome, Diabetes with Nerve Disease     insulin glargine 100 UNIT/ML injection  Commonly known as:  LANTUS  Inject 30 Units into the skin at bedtime.      metFORMIN 1000 MG tablet  Commonly known as:  GLUCOPHAGE  Take 1 tablet (1,000 mg total) by mouth 2 (two) times daily with a meal.   Indication:  Type 2 Diabetes         Follow-up recommendations:  Activity:  as tolerated Diet:  heart healthy diet  Comments:  Patient referred to [facility], bed availability on Monday, Rx provided  Signed: Waylan Boga, NP 11/01/2015, 11:18 AM

## 2015-11-03 ENCOUNTER — Emergency Department (HOSPITAL_COMMUNITY)
Admission: EM | Admit: 2015-11-03 | Discharge: 2015-11-03 | Disposition: A | Discharge status: Home / Self Care | Payer: No Typology Code available for payment source | Attending: Emergency Medicine | Admitting: Emergency Medicine

## 2015-11-03 ENCOUNTER — Encounter (HOSPITAL_COMMUNITY): Payer: Self-pay | Admitting: Emergency Medicine

## 2015-11-03 DIAGNOSIS — Z794 Long term (current) use of insulin: Secondary | ICD-10-CM | POA: Insufficient documentation

## 2015-11-03 DIAGNOSIS — F10220 Alcohol dependence with intoxication, uncomplicated: Secondary | ICD-10-CM

## 2015-11-03 DIAGNOSIS — F17210 Nicotine dependence, cigarettes, uncomplicated: Secondary | ICD-10-CM | POA: Insufficient documentation

## 2015-11-03 DIAGNOSIS — F10239 Alcohol dependence with withdrawal, unspecified: Secondary | ICD-10-CM | POA: Insufficient documentation

## 2015-11-03 DIAGNOSIS — F1094 Alcohol use, unspecified with alcohol-induced mood disorder: Secondary | ICD-10-CM

## 2015-11-03 DIAGNOSIS — F10229 Alcohol dependence with intoxication, unspecified: Secondary | ICD-10-CM | POA: Diagnosis present

## 2015-11-03 DIAGNOSIS — F1024 Alcohol dependence with alcohol-induced mood disorder: Secondary | ICD-10-CM | POA: Insufficient documentation

## 2015-11-03 DIAGNOSIS — E119 Type 2 diabetes mellitus without complications: Secondary | ICD-10-CM | POA: Insufficient documentation

## 2015-11-03 DIAGNOSIS — F329 Major depressive disorder, single episode, unspecified: Secondary | ICD-10-CM | POA: Insufficient documentation

## 2015-11-03 DIAGNOSIS — F1010 Alcohol abuse, uncomplicated: Secondary | ICD-10-CM

## 2015-11-03 DIAGNOSIS — F10230 Alcohol dependence with withdrawal, uncomplicated: Secondary | ICD-10-CM

## 2015-11-03 LAB — CBC WITH DIFFERENTIAL/PLATELET
Basophils Absolute: 0 10*3/uL (ref 0.0–0.1)
Basophils Relative: 0 %
EOS ABS: 0.1 10*3/uL (ref 0.0–0.7)
EOS PCT: 2 %
HCT: 33.7 % — ABNORMAL LOW (ref 39.0–52.0)
HEMOGLOBIN: 12.5 g/dL — AB (ref 13.0–17.0)
LYMPHS PCT: 43 %
Lymphs Abs: 2.5 10*3/uL (ref 0.7–4.0)
MCH: 33.9 pg (ref 26.0–34.0)
MCHC: >37 g/dL — ABNORMAL HIGH (ref 30.0–36.0)
MCV: 91.3 fL (ref 78.0–100.0)
MONO ABS: 0.5 10*3/uL (ref 0.1–1.0)
Monocytes Relative: 9 %
NEUTROS PCT: 46 %
Neutro Abs: 2.8 10*3/uL (ref 1.7–7.7)
PLATELETS: 67 10*3/uL — AB (ref 150–400)
RBC: 3.69 MIL/uL — AB (ref 4.22–5.81)
RDW: 13.6 % (ref 11.5–15.5)
WBC: 5.9 10*3/uL (ref 4.0–10.5)

## 2015-11-03 LAB — COMPREHENSIVE METABOLIC PANEL
ALBUMIN: 3.5 g/dL (ref 3.5–5.0)
ALK PHOS: 104 U/L (ref 38–126)
ALT: 163 U/L — ABNORMAL HIGH (ref 17–63)
ANION GAP: 8 (ref 5–15)
AST: 204 U/L — ABNORMAL HIGH (ref 15–41)
BUN: 9 mg/dL (ref 6–20)
CALCIUM: 8.8 mg/dL — AB (ref 8.9–10.3)
CO2: 28 mmol/L (ref 22–32)
Chloride: 96 mmol/L — ABNORMAL LOW (ref 101–111)
Creatinine, Ser: 0.51 mg/dL — ABNORMAL LOW (ref 0.61–1.24)
GFR calc non Af Amer: >60 mL/min (ref >60)
GLUCOSE: 144 mg/dL — AB (ref 65–99)
POTASSIUM: 3.4 mmol/L — AB (ref 3.5–5.1)
SODIUM: 132 mmol/L — AB (ref 135–145)
Total Bilirubin: 0.7 mg/dL (ref 0.3–1.2)
Total Protein: 7.3 g/dL (ref 6.5–8.1)

## 2015-11-03 LAB — RAPID URINE DRUG SCREEN, HOSP PERFORMED
AMPHETAMINES: NOT DETECTED
BENZODIAZEPINES: NOT DETECTED
Barbiturates: NOT DETECTED
Cocaine: NOT DETECTED
OPIATES: NOT DETECTED
TETRAHYDROCANNABINOL: NOT DETECTED

## 2015-11-03 LAB — SALICYLATE LEVEL: Salicylate Lvl: <4 mg/dL (ref 2.8–30.0)

## 2015-11-03 LAB — HEMOGLOBIN A1C
HEMOGLOBIN A1C: 7.6 % — AB (ref 4.8–5.6)
MEAN PLASMA GLUCOSE: 171 mg/dL

## 2015-11-03 LAB — ACETAMINOPHEN LEVEL: Acetaminophen (Tylenol), Serum: <10 ug/mL — ABNORMAL LOW (ref 10–30)

## 2015-11-03 LAB — ETHANOL: Alcohol, Ethyl (B): 90 mg/dL — ABNORMAL HIGH (ref <5)

## 2015-11-03 MED ORDER — ONDANSETRON HCL 4 MG PO TABS: 4.0000 mg | ORAL_TABLET | Freq: Three times a day (TID) | ORAL | Status: DC | PRN

## 2015-11-03 MED ORDER — GABAPENTIN 600 MG PO TABS: 600.0000 mg | ORAL_TABLET | Freq: Three times a day (TID) | ORAL | Status: DC

## 2015-11-03 MED ORDER — ACETAMINOPHEN 325 MG PO TABS: 650.0000 mg | ORAL_TABLET | ORAL | Status: DC | PRN

## 2015-11-03 MED ORDER — LORAZEPAM 1 MG PO TABS
1.0000 mg | ORAL_TABLET | Freq: Once | ORAL | Status: AC
  Administered 2015-11-03: 1 mg via ORAL
  Filled 2015-11-03: qty 1

## 2015-11-03 MED ORDER — GABAPENTIN 300 MG PO CAPS
600.0000 mg | ORAL_CAPSULE | Freq: Three times a day (TID) | ORAL | Status: DC
  Administered 2015-11-03: 600 mg via ORAL
  Filled 2015-11-03: qty 2

## 2015-11-03 MED ORDER — LORAZEPAM 1 MG PO TABS: 1.0000 mg | ORAL_TABLET | Freq: Four times a day (QID) | ORAL | Status: DC

## 2015-11-03 MED ORDER — LORAZEPAM 1 MG PO TABS: 1.0000 mg | ORAL_TABLET | Freq: Four times a day (QID) | ORAL | Status: DC | PRN

## 2015-11-03 MED ORDER — IBUPROFEN 200 MG PO TABS: 600.0000 mg | ORAL_TABLET | Freq: Three times a day (TID) | ORAL | Status: DC | PRN

## 2015-11-03 MED ORDER — ALUM & MAG HYDROXIDE-SIMETH 200-200-20 MG/5ML PO SUSP: 30.0000 mL | ORAL | Status: DC | PRN

## 2015-11-03 MED ORDER — HYDROXYZINE HCL 25 MG PO TABS: 25.0000 mg | ORAL_TABLET | Freq: Four times a day (QID) | ORAL | Status: DC | PRN

## 2015-11-03 NOTE — BH Assessment (Signed)
[place] Assessment Progress Note  Per Corena Pilgrim, MD, this pt does not require psychiatric hospitalization at this time.  Pt is to be discharged from [facility] with referral information for outpatient mental health and substance abuse treatment providers.  Discharge instructions advise pt to follow up with [facility] and with [facility].  Maudie Mercury, the nurse case manager, will also be asked to speak to pt to be sure that he has primary care services.  Pt's nurse has been notified.  Jalene Mullet, [place] Triage Specialist [phone]

## 2015-11-03 NOTE — ED Notes (Signed)
Spoke with TTS. TTS left voicemail with Maudie Mercury for Case Management to come see patient prior to discharge.

## 2015-11-03 NOTE — ED Provider Notes (Signed)
CSN: [id number]     Arrival date & time 11/03/15  0033 History  By signing my name below, I, [name], attest that this documentation has been prepared under the direction and in the presence of Christofer Shen, MD. Electronically Signed: [name], ED Scribe. 11/03/2015. 12:59 AM   Chief Complaint  Patient presents with  . Alcohol Intoxication   Patient is a 60 y.o. male presenting with intoxication. The history is provided by the patient. No language interpreter was used.  Alcohol Intoxication This is a chronic problem. The current episode started yesterday. The problem occurs constantly. The problem has not changed since onset.Pertinent negatives include no chest pain, no abdominal pain, no headaches and no shortness of breath. Nothing aggravates the symptoms. Nothing relieves the symptoms. He has tried nothing for the symptoms.   [name] is a 60 y.o. male with PMHx of DM, neuropathy, alcohol abuse, and alcoholic cirrhosis of the liver who presents to the Emergency Department complaining of alcohol intoxication. Pt states that he was seen for a 20 hour observation at [facility] less than 2 days ago and when he was discharged he started drinking again immediately. Today he has had 2 foryy ounce beers and wants to be seen for detox.  Associated symptoms include racing palpitations. He also complains of bilateral foot pain for the last month since he ran out of Neurontin.  Pt denies SI  Past Medical History  Diagnosis Date  . Diabetes mellitus   . Chronic hyponatremia     pt denies having   . Alcoholic ([place])   . Alcoholic cirrhosis of liver ([place])     Pt calls this a false diagnosis. Pt denies  . Thrombocytopenia ([place])   . Hyperglycemia   . Depression   . Anxiety   . Alcoholism /alcohol abuse ([place])   . Diabetes mellitus without complication ([place])   . Hepatitis C   . Neuropathy [facility])    Past Surgical History  Procedure Laterality Date  . Plate in lower right  leg  2008  . Fracture surgery  right lower leg plate placed years ago   Family History  Problem Relation Age of Onset  . ALS Father    Social History  Substance Use Topics  . Smoking status: Current Every Day Smoker -- 1.00 packs/day for 40 years    Types: Cigarettes  . Smokeless tobacco: None  . Alcohol Use: Yes     Comment: 18/daily    Review of Systems  Respiratory: Negative for shortness of breath.   Cardiovascular: Negative for chest pain.  Gastrointestinal: Negative for abdominal pain.  Musculoskeletal:       Bilateral foot pain   Neurological: Negative for headaches.  Psychiatric/Behavioral: Positive for behavioral problems.  All other systems reviewed and are negative.     Allergies  Benadryl; Benadryl; Sulfa antibiotics; Tegretol; Librium; Librium; and Sulfa antibiotics  Home Medications   Prior to Admission medications   Medication Sig Start Date End Date Taking? Authorizing Provider  gabapentin (NEURONTIN) 600 MG tablet Take 1 tablet (600 mg total) by mouth 3 (three) times daily. 11/01/15   Patrecia Pour, NP  insulin glargine (LANTUS) 100 UNIT/ML injection Inject 0.3 mLs (30 Units total) into the skin at bedtime. 11/01/15   Patrecia Pour, NP  metFORMIN (GLUCOPHAGE) 1000 MG tablet Take 1 tablet (1,000 mg total) by mouth 2 (two) times daily with a meal. 11/01/15   Patrecia Pour, NP   BP 136/83 mmHg  Pulse  89  Temp(Src) 98.5 F (36.9 C) (Oral)  Resp 18  SpO2 96% Physical Exam  Constitutional: He is oriented to person, place, and time. He appears well-developed and well-nourished.  HENT:  Head: Normocephalic.  Mouth/Throat: Oropharynx is clear and moist.  Moist mucous membranes No exudate  Eyes: Pupils are equal, round, and reactive to light.  Neck: Normal range of motion. Neck supple.  Trachea midline No bruit   Cardiovascular: Normal rate, regular rhythm and intact distal pulses.   2+ DP bilaterally Capillary refill less than 2 seconds bilaterally    Pulmonary/Chest: Effort normal and breath sounds normal. No stridor. He has no wheezes. He has no rales.  CTAB   Abdominal: Soft. He exhibits no mass. There is no tenderness. There is no rebound and no guarding.  Musculoskeletal: Normal range of motion.  Neurological: He is alert and oriented to person, place, and time. He has normal reflexes.  Skin: Skin is warm and dry.  Psychiatric: He has a normal mood and affect. His behavior is normal.  Nursing note and vitals reviewed.   ED Course  Procedures (including critical care time) DIAGNOSTIC STUDIES: Oxygen Saturation is 96% on RA,  normal by my interpretation.    COORDINATION OF CARE: 1:03 AM Discussed treatment plan which includes lab work and TTS consult with pt at bedside and pt agreed to plan.  Labs Review Labs Reviewed - No data to display  Imaging Review No results found. I have personally reviewed and evaluated these lab results as part of my medical decision-making.   EKG Interpretation None      MDM   Final diagnoses:  None   Filed Vitals:   11/03/15 0044 11/03/15 0351  BP: 136/83 146/84  Pulse: 89 85  Temp: 98.5 F (36.9 C) 97.7 F (36.5 C)  Resp: 18 20    Results for orders placed or performed during the hospital encounter of 11/03/15  Comprehensive metabolic panel  Result Value Ref Range   Sodium 132 (L) 135 - 145 mmol/L   Potassium 3.4 (L) 3.5 - 5.1 mmol/L   Chloride 96 (L) 101 - 111 mmol/L   CO2 28 22 - 32 mmol/L   Glucose, Bld 144 (H) 65 - 99 mg/dL   BUN 9 6 - 20 mg/dL   Creatinine, Ser 0.51 (L) 0.61 - 1.24 mg/dL   Calcium 8.8 (L) 8.9 - 10.3 mg/dL   Total Protein 7.3 6.5 - 8.1 g/dL   Albumin 3.5 3.5 - 5.0 g/dL   AST 204 (H) 15 - 41 U/L   ALT 163 (H) 17 - 63 U/L   Alkaline Phosphatase 104 38 - 126 U/L   Total Bilirubin 0.7 0.3 - 1.2 mg/dL   GFR calc non Af Amer >60 >60 mL/min   GFR calc Af Amer >60 >60 mL/min   Anion gap 8 5 - 15  Ethanol  Result Value Ref Range   Alcohol, Ethyl (B)  90 (H) <5 mg/dL  CBC with Diff  Result Value Ref Range   WBC 5.9 4.0 - 10.5 K/uL   RBC 3.69 (L) 4.22 - 5.81 MIL/uL   Hemoglobin 12.5 (L) 13.0 - 17.0 g/dL   HCT 33.7 (L) 39.0 - 52.0 %   MCV 91.3 78.0 - 100.0 fL   MCH 33.9 26.0 - 34.0 pg   MCHC >37.0 (H) 30.0 - 36.0 g/dL   RDW 13.6 11.5 - 15.5 %   Platelets 67 (L) 150 - 400 K/uL   Neutrophils Relative % 46 %  Lymphocytes Relative 43 %   Monocytes Relative 9 %   Eosinophils Relative 2 %   Basophils Relative 0 %   Neutro Abs 2.8 1.7 - 7.7 K/uL   Lymphs Abs 2.5 0.7 - 4.0 K/uL   Monocytes Absolute 0.5 0.1 - 1.0 K/uL   Eosinophils Absolute 0.1 0.0 - 0.7 K/uL   Basophils Absolute 0.0 0.0 - 0.1 K/uL   RBC Morphology [place]   Urine rapid drug screen (hosp performed)not at [facility]  Result Value Ref Range   Opiates NONE DETECTED NONE DETECTED   Cocaine NONE DETECTED NONE DETECTED   Benzodiazepines NONE DETECTED NONE DETECTED   Amphetamines NONE DETECTED NONE DETECTED   Tetrahydrocannabinol NONE DETECTED NONE DETECTED   Barbiturates NONE DETECTED NONE DETECTED  Acetaminophen level  Result Value Ref Range   Acetaminophen (Tylenol), Serum <10 (L) 10 - 30 ug/mL  Salicylate level  Result Value Ref Range   Salicylate Lvl <6.7 2.8 - 30.0 mg/dL   No results found.  Medications  acetaminophen (TYLENOL) tablet 650 mg (not administered)  ibuprofen (ADVIL,MOTRIN) tablet 600 mg (not administered)  ondansetron (ZOFRAN) tablet 4 mg (not administered)  alum & mag hydroxide-simeth (MAALOX/MYLANTA) 200-200-20 MG/5ML suspension 30 mL (not administered)    Requesting detox, recently d/c from [facility]   D/c consulted dispo per TTS.    I personally performed the services described in this documentation, which was scribed in my presence. The recorded information has been reviewed and is accurate.      Veatrice Kells, MD 11/03/15 [phone]

## 2015-11-03 NOTE — ED Notes (Signed)
Patient complains of foot pain. Patient has hx of neuropathy. Patient also wants detox

## 2015-11-03 NOTE — Consult Note (Signed)
[facility] Consult   Reason for Consult:  Alcohol intoxication Referring Physician:  EDP Patient Identification: [name] MRN:  [id number] Principal Diagnosis: Alcohol-induced mood disorder [facility]) Diagnosis:   Patient Active Problem List   Diagnosis Date Noted  . Alcohol-induced mood disorder ([place]) [F10.94] 10/31/2015    Priority: High  . Alcohol dependence with uncomplicated withdrawal ([place]) [F10.230] 03/03/2015    Priority: High  . Suicidal ideation [R45.851] 11/20/2014    Priority: High  . Substance induced mood disorder ([place]) [F19.94] 06/20/2013    Priority: High  . Alcohol dependence with acute alcoholic intoxication ([place]) [F10.229] 11/06/2011    Priority: High  . Moderate major depression ([place]) [F32.1] 04/21/2015  . Major depressive disorder, single episode, moderate ([place]) [F32.1] 03/10/2015  . Alcohol abuse [F10.10]   . Suicidal ideation [R45.851]   . Anxiousness [F41.9] 09/11/2013  . S/P alcohol detoxification [Z09] 09/27/2012  . Alcohol dependence ([place]) [F10.20] 01/29/2012  . Type II or unspecified type diabetes mellitus without mention of complication, uncontrolled [E11.65] 11/11/2011  . Thrombocytopenia ([place]) [D69.6]   . Hyperglycemia [R73.9]   . Alcoholic ([place]) [V56.43]   . Chronic hyponatremia [E87.1]   . Diabetes mellitus ([place]) [E11.9] 11/06/2011  . Hepatitis C [B19.20] 11/06/2011    Total Time spent with patient: 45 minutes  Subjective:   [name] is a 60 y.o. male patient does not warrant admission.  HPI:  60 yo male who presented to the ED under the influence of alcohol and foot pain.  Denies suicidal/homicidal ideations, hallucinations, and withdrawal symptoms.  His main concern in foot pain and not having his neuropathy.  Past Psychiatric History: alcohol abuse, depression  Risk to Self: Is patient at risk for suicide?: No Risk to Others:   Prior Inpatient Therapy:   Prior Outpatient Therapy:    Past Medical  History:  Past Medical History  Diagnosis Date  . Diabetes mellitus   . Chronic hyponatremia     pt denies having   . Alcoholic ([place])   . Alcoholic cirrhosis of liver ([place])     Pt calls this a false diagnosis. Pt denies  . Thrombocytopenia ([place])   . Hyperglycemia   . Depression   . Anxiety   . Alcoholism /alcohol abuse ([place])   . Diabetes mellitus without complication ([place])   . Hepatitis C   . Neuropathy [facility])     Past Surgical History  Procedure Laterality Date  . Plate in lower right leg  2008  . Fracture surgery  right lower leg plate placed years ago   Family History:  Family History  Problem Relation Age of Onset  . ALS Father    Family Psychiatric  History: none Social History:  History  Alcohol Use  . Yes    Comment: 18/daily     History  Drug Use No    Social History   Social History  . Marital Status: Divorced    Spouse Name: N/A  . Number of Children: N/A  . Years of Education: N/A   Social History Main Topics  . Smoking status: Current Every Day Smoker -- 1.00 packs/day for 40 years    Types: Cigarettes  . Smokeless tobacco: None  . Alcohol Use: Yes     Comment: 18/daily  . Drug Use: No  . Sexual Activity: Not Currently   Other Topics Concern  . None   Social History Narrative   ** Merged History Encounter **       Additional Social History:  Allergies:   Allergies  Allergen Reactions  . Benadryl [Diphenhydramine Hcl] Other (See Comments)    "Causes nervousness"  . Benadryl [Diphenhydramine] Other (See Comments)    "makes my skin crawl"  . Sulfa Antibiotics Other (See Comments)    Childhood allergy  . Tegretol [Carbamazepine] Other (See Comments)    "almost killed me"  . Librium [Chlordiazepoxide Hcl] Anxiety  . Librium [Chlordiazepoxide] Anxiety  . Sulfa Antibiotics Rash    Labs:  Results for orders placed or performed during the hospital encounter of 11/03/15 (from the past 48 hour(s))  Comprehensive metabolic panel      Status: Abnormal   Collection Time: 11/03/15  1:28 AM  Result Value Ref Range   Sodium 132 (L) 135 - 145 mmol/L   Potassium 3.4 (L) 3.5 - 5.1 mmol/L   Chloride 96 (L) 101 - 111 mmol/L   CO2 28 22 - 32 mmol/L   Glucose, Bld 144 (H) 65 - 99 mg/dL   BUN 9 6 - 20 mg/dL   Creatinine, Ser 0.51 (L) 0.61 - 1.24 mg/dL   Calcium 8.8 (L) 8.9 - 10.3 mg/dL   Total Protein 7.3 6.5 - 8.1 g/dL   Albumin 3.5 3.5 - 5.0 g/dL   AST 204 (H) 15 - 41 U/L   ALT 163 (H) 17 - 63 U/L   Alkaline Phosphatase 104 38 - 126 U/L   Total Bilirubin 0.7 0.3 - 1.2 mg/dL   GFR calc non Af Amer >60 >60 mL/min   GFR calc Af Amer >60 >60 mL/min    Comment: (NOTE) The eGFR has been calculated using the CKD EPI equation. This calculation has not been validated in all clinical situations. eGFR's persistently <60 mL/min signify possible Chronic Kidney Disease.    Anion gap 8 5 - 15  Ethanol     Status: Abnormal   Collection Time: 11/03/15  1:28 AM  Result Value Ref Range   Alcohol, Ethyl (B) 90 (H) <5 mg/dL    Comment:        LOWEST DETECTABLE LIMIT FOR SERUM ALCOHOL IS 5 mg/dL FOR MEDICAL PURPOSES ONLY   CBC with Diff     Status: Abnormal   Collection Time: 11/03/15  1:28 AM  Result Value Ref Range   WBC 5.9 4.0 - 10.5 K/uL   RBC 3.69 (L) 4.22 - 5.81 MIL/uL   Hemoglobin 12.5 (L) 13.0 - 17.0 g/dL   HCT 33.7 (L) 39.0 - 52.0 %   MCV 91.3 78.0 - 100.0 fL   MCH 33.9 26.0 - 34.0 pg   MCHC >37.0 (H) 30.0 - 36.0 g/dL    Comment: [place]   RDW 13.6 11.5 - 15.5 %   Platelets 67 (L) 150 - 400 K/uL    Comment: SPECIMEN CHECKED FOR CLOTS REPEATED TO VERIFY PLATELET COUNT CONFIRMED BY SMEAR    Neutrophils Relative % 46 %   Lymphocytes Relative 43 %   Monocytes Relative 9 %   Eosinophils Relative 2 %   Basophils Relative 0 %   Neutro Abs 2.8 1.7 - 7.7 K/uL   Lymphs Abs 2.5 0.7 - 4.0 K/uL   Monocytes Absolute 0.5 0.1 - 1.0 K/uL   Eosinophils Absolute 0.1 0.0 - 0.7 K/uL   Basophils Absolute 0.0 0.0 - 0.1 K/uL    RBC Morphology [place]   Acetaminophen level     Status: Abnormal   Collection Time: 11/03/15  1:28 AM  Result Value Ref Range   Acetaminophen (Tylenol), Serum <10 (L) 10 - 30  ug/mL    Comment:        THERAPEUTIC CONCENTRATIONS VARY SIGNIFICANTLY. A RANGE OF 10-30 ug/mL MAY BE AN EFFECTIVE CONCENTRATION FOR MANY PATIENTS. HOWEVER, SOME ARE BEST TREATED AT CONCENTRATIONS OUTSIDE THIS RANGE. ACETAMINOPHEN CONCENTRATIONS >150 ug/mL AT 4 HOURS AFTER INGESTION AND >50 ug/mL AT 12 HOURS AFTER INGESTION ARE OFTEN ASSOCIATED WITH TOXIC REACTIONS.   Salicylate level     Status: None   Collection Time: 11/03/15  1:28 AM  Result Value Ref Range   Salicylate Lvl <5.1 2.8 - 30.0 mg/dL  Urine rapid drug screen (hosp performed)not at [facility]     Status: None   Collection Time: 11/03/15  3:47 AM  Result Value Ref Range   Opiates NONE DETECTED NONE DETECTED   Cocaine NONE DETECTED NONE DETECTED   Benzodiazepines NONE DETECTED NONE DETECTED   Amphetamines NONE DETECTED NONE DETECTED   Tetrahydrocannabinol NONE DETECTED NONE DETECTED   Barbiturates NONE DETECTED NONE DETECTED    Comment:        DRUG SCREEN FOR MEDICAL PURPOSES ONLY.  IF CONFIRMATION IS NEEDED FOR ANY PURPOSE, NOTIFY LAB WITHIN 5 DAYS.        LOWEST DETECTABLE LIMITS FOR URINE DRUG SCREEN Drug Class       Cutoff (ng/mL) Amphetamine      1000 Barbiturate      200 Benzodiazepine   025 Tricyclics       852 Opiates          300 Cocaine          300 THC              50     Current Facility-Administered Medications  Medication Dose Route Frequency Provider Last Rate Last Dose  . acetaminophen (TYLENOL) tablet 650 mg  650 mg Oral Q4H PRN April Palumbo, MD      . alum & mag hydroxide-simeth (MAALOX/MYLANTA) 200-200-20 MG/5ML suspension 30 mL  30 mL Oral PRN April Palumbo, MD      . gabapentin (NEURONTIN) tablet 600 mg  600 mg Oral TID Patrecia Pour, NP      . hydrOXYzine (ATARAX/VISTARIL) tablet 25 mg  25 mg Oral Q6H PRN  Lurena Nida, NP      . ibuprofen (ADVIL,MOTRIN) tablet 600 mg  600 mg Oral Q8H PRN April Palumbo, MD      . LORazepam (ATIVAN) tablet 1 mg  1 mg Oral Q6H PRN Lurena Nida, NP      . LORazepam (ATIVAN) tablet 1 mg  1 mg Oral QID Lurena Nida, NP      . ondansetron [facility]) tablet 4 mg  4 mg Oral Q8H PRN April Palumbo, MD       Current Outpatient Prescriptions  Medication Sig Dispense Refill  . gabapentin (NEURONTIN) 600 MG tablet Take 1 tablet (600 mg total) by mouth 3 (three) times daily. 9 tablet 0  . insulin glargine (LANTUS) 100 UNIT/ML injection Inject 0.3 mLs (30 Units total) into the skin at bedtime. 10 mL 11  . metFORMIN (GLUCOPHAGE) 1000 MG tablet Take 1 tablet (1,000 mg total) by mouth 2 (two) times daily with a meal. 6 tablet 0    Musculoskeletal: Strength & Muscle Tone: within normal limits Gait & Station: normal Patient leans: N/A  Psychiatric Specialty Exam: Physical Exam  Constitutional: He is oriented to person, place, and time. He appears well-developed and well-nourished.  HENT:  Head: Normocephalic.  Neck: Normal range of motion.  Respiratory: Effort normal.  Musculoskeletal:  Normal range of motion.  Neurological: He is alert and oriented to person, place, and time.  Skin: Skin is warm and dry.  Psychiatric: His speech is normal and behavior is normal. Judgment and thought content normal. His mood appears anxious. Cognition and memory are normal.    Review of Systems  Constitutional: Negative.   HENT: Negative.   Eyes: Negative.   Respiratory: Negative.   Cardiovascular: Negative.   Gastrointestinal: Negative.   Genitourinary: Negative.   Musculoskeletal: Negative.   Skin: Negative.   Neurological: Negative.   Endo/Heme/Allergies: Negative.   Psychiatric/Behavioral: Positive for substance abuse. The patient is nervous/anxious.     Blood pressure 170/89, pulse 91, temperature 97.7 F (36.5 C), temperature source Oral, resp. rate 18, SpO2 95 %.There is  no weight on file to calculate BMI.  General Appearance: Disheveled  Eye Contact:  Fair  Speech:  Normal Rate  Volume:  Normal  Mood:  Anxious  Affect:  Congruent  Thought Process:  Coherent and Descriptions of Associations: Intact  Orientation:  Full (Time, Place, and Person)  Thought Content:  WDL  Suicidal Thoughts:  No  Homicidal Thoughts:  No  Memory:  Immediate;   Fair Recent;   Fair Remote;   Fair  Judgement:  Fair  Insight:  Fair  Psychomotor Activity:  Decreased  Concentration:  Concentration: Fair and Attention Span: Fair  Recall:  [place] of Knowledge:  Good  Language:  Good  Akathisia:  No  Handed:  Right  AIMS (if indicated):     Assets:  Leisure Time Physical Health  ADL's:  Intact  Cognition:  WNL  Sleep:        Treatment Plan Summary: Daily contact with patient to assess and evaluate symptoms and progress in treatment, Medication management and Plan alcohol induced mood disorder, alcohol dependence with uncomplicated withdrawal:  -Crisis stabilization -Medication management:  Ativan PRN alcohol detox and restarted gabapentin 600 mg TID for neuropathy started. -Individual and substance abuse counseling -Rx for gabapentin  Disposition: No evidence of imminent risk to self or others at present.    Waylan Boga, NP 11/03/2015 10:09 AM Patient seen face-to-face for psychiatric evaluation, chart reviewed and case discussed with the physician extender and developed treatment plan. Reviewed the information documented and agree with the treatment plan. Corena Pilgrim, MD

## 2015-11-03 NOTE — Progress Notes (Addendum)
Pt informed ED CM he now has his own place and is not homeless.  Pt states he has been binge drinking after losing his job and the IRS informed him he owns back taxes Pt states he is aware of the need to see Velta Addison to "re up" Pt states he gets his medications for "free from the health department"  Pt encouraged to go see Velta Addison as requested on Wednesday 11/05/15 to get evaluated and to continue to receive his medications at the health department  CM discussed and provided written information to assist pt with determining choice for uninsured accepting pcps, discussed the importance of pcp vs EDP services for f/u care, www.needymeds.org, www.goodrx.com, discounted pharmacies and other [employer] such as [employer], affordable care act, financial assistance, uninsured dental services, [place] med assist, DSS and  health department  Reviewed resources for [employer] uninsured accepting pcps like [facility], family medicine at [employer], [facility] [place] [facility], [facility], local urgent care centers, [facility], [facility] family practice, general medical clinics, family services of the [place], [facility] urgent care plus others, medication resources, [facility] out patient pharmacies and housing Pt voiced understanding and appreciation of resources provided  Provided [facility] contact information  Pt ready for d/c Pt confirms he was informed by [employer] staff this am that they would "give me resources" CM informed ED RN.

## 2015-11-03 NOTE — Discharge Instructions (Signed)
For your ongoing mental health needs, you are advised to follow up with [facility].  New and returning patients are seen at their walk-in clinic.  Walk-in hours are Monday - Friday from 8:00 am - 3:00 pm.  Walk-in patients are seen on a first come, first served basis.  Try to arrive as early as possible for he best chance of being seen the same day:       [facility]      [address], [place] [address]      [phone]  To help you maintain a sober lifestyle, a substance abuse treatment program may be beneficial to you.  Contact [facility] at your earliest opportunity to ask about enrolling in their program:       [facility]      [address] [place]. [place], [place] [address]      [phone]      New patients are seen at the walk-in clinic every Tuesday from 9:00 am - 12:00 pm.

## 2015-11-03 NOTE — Progress Notes (Addendum)
Consulted for pt with medication needs by [facility] staff and nursing  Pt noted to be seen by [place]  1031 ED Cm Spoke with [facility] and pt is non compliant with f/u to get his medications from her  CM encouraged pt to f/u on Wednesday to get medication

## 2015-11-03 NOTE — ED Notes (Addendum)
Discharge instructions, follow up care, and rx x1 reviewed with patient. Patient verbalized understanding. Patient given bus pass.

## 2015-11-03 NOTE — Consult Note (Signed)
Telepsych Consultation   Reason for Consult:  Psychiatric evaluation Referring Physician:  EDP Patient Identification: [name] MRN:  [id number] Principal Diagnosis: <principal problem not specified> Diagnosis:   Patient Active Problem List   Diagnosis Date Noted  . Alcohol-induced mood disorder ([place]) [F10.94] 10/31/2015  . Moderate major depression ([place]) [F32.1] 04/21/2015  . Major depressive disorder, single episode, moderate ([place]) [F32.1] 03/10/2015  . Alcohol dependence with uncomplicated withdrawal ([place]) [F10.230] 03/03/2015  . Alcohol abuse [F10.10]   . Suicidal ideation [R45.851]   . Suicidal ideation [R45.851] 11/20/2014  . Anxiousness [F41.9] 09/11/2013  . Substance induced mood disorder ([place]) [F19.94] 06/20/2013  . S/P alcohol detoxification [Z09] 09/27/2012  . Alcohol dependence ([place]) [F10.20] 01/29/2012  . Type II or unspecified type diabetes mellitus without mention of complication, uncontrolled [E11.65] 11/11/2011  . Thrombocytopenia ([place]) [D69.6]   . Hyperglycemia [R73.9]   . Alcoholic ([place]) [D98.33]   . Chronic hyponatremia [E87.1]   . Alcohol dependence with acute alcoholic intoxication ([place]) [F10.229] 11/06/2011  . Diabetes mellitus ([place]) [E11.9] 11/06/2011  . Hepatitis C [B19.20] 11/06/2011    Total Time spent with patient: 30 minutes  Subjective:   [name] is a 60 y.o. male patient who state "I am not doing good."  HPI:  [name] is 60 yo Caucasian male who presented to [facility] ED for alcohol intoxication. He states he was in the Observation Unit on Saturday (2 days ago). He states he began drinking "immediately" after he was discharged. A review of the patient's charts shows that patient was referred to [facility] and was informed they would have a bed. Patient states "I can't go to [facility] because I'll lose my place to stay." He states the first of the month is in few days and he needs to earn money to pay is rent and "I just can't do a five day  detox." He states he drank 4 -40 oz beers yesterday and his last drink was Sunday night around 8:30 pm. He states he feels like he is withdrawing stating that he is having some sweating and tremors. He states he started drinking heavily 2 weeks ago after "the government seized my bank account because they say I owe from 2010." He states the government retains 100 % of his earning so it is difficult for him to earn money. This a big stressor for him. He reports his longest period of sobriety is 5 years when he was in his 6s. He was sober 5 months in 2016.  He states he takes gabapentin, lantus, and metformin. He states he gets his medication from [facility] at the [facility] but states he has been out of his gabapentin for 2 weeks.  He denies suicidal/homicidal ideation, intent or plan.    Past Psychiatric History: Alcohol abuse, Depresson, anxiety.  Risk to Self: Is patient at risk for suicide?: No Risk to Others:   Prior Inpatient Therapy:   Prior Outpatient Therapy:    Past Medical History:  Past Medical History  Diagnosis Date  . Diabetes mellitus   . Chronic hyponatremia     pt denies having   . Alcoholic ([place])   . Alcoholic cirrhosis of liver ([place])     Pt calls this a false diagnosis. Pt denies  . Thrombocytopenia ([place])   . Hyperglycemia   . Depression   . Anxiety   . Alcoholism /alcohol abuse ([place])   . Diabetes mellitus without complication ([place])   . Hepatitis C   . Neuropathy ([place])  Past Surgical History  Procedure Laterality Date  . Plate in lower right leg  2008  . Fracture surgery  right lower leg plate placed years ago   Family History:  Family History  Problem Relation Age of Onset  . ALS Father    Family Psychiatric  History: Unknown Social History:  History  Alcohol Use  . Yes    Comment: 18/daily     History  Drug Use No    Social History   Social History  . Marital Status: Divorced    Spouse Name: N/A  . Number of Children: N/A  . Years of  Education: N/A   Social History Main Topics  . Smoking status: Current Every Day Smoker -- 1.00 packs/day for 40 years    Types: Cigarettes  . Smokeless tobacco: None  . Alcohol Use: Yes     Comment: 18/daily  . Drug Use: No  . Sexual Activity: Not Currently   Other Topics Concern  . None   Social History Narrative   ** Merged History Encounter **       Additional Social History:    Allergies:   Allergies  Allergen Reactions  . Benadryl [Diphenhydramine Hcl] Other (See Comments)    "Causes nervousness"  . Benadryl [Diphenhydramine] Other (See Comments)    "makes my skin crawl"  . Sulfa Antibiotics Other (See Comments)    Childhood allergy  . Tegretol [Carbamazepine] Other (See Comments)    "almost killed me"  . Librium [Chlordiazepoxide Hcl] Anxiety  . Librium [Chlordiazepoxide] Anxiety  . Sulfa Antibiotics Rash    Labs:  Results for orders placed or performed during the hospital encounter of 11/03/15 (from the past 48 hour(s))  Comprehensive metabolic panel     Status: Abnormal   Collection Time: 11/03/15  1:28 AM  Result Value Ref Range   Sodium 132 (L) 135 - 145 mmol/L   Potassium 3.4 (L) 3.5 - 5.1 mmol/L   Chloride 96 (L) 101 - 111 mmol/L   CO2 28 22 - 32 mmol/L   Glucose, Bld 144 (H) 65 - 99 mg/dL   BUN 9 6 - 20 mg/dL   Creatinine, Ser 0.51 (L) 0.61 - 1.24 mg/dL   Calcium 8.8 (L) 8.9 - 10.3 mg/dL   Total Protein 7.3 6.5 - 8.1 g/dL   Albumin 3.5 3.5 - 5.0 g/dL   AST 204 (H) 15 - 41 U/L   ALT 163 (H) 17 - 63 U/L   Alkaline Phosphatase 104 38 - 126 U/L   Total Bilirubin 0.7 0.3 - 1.2 mg/dL   GFR calc non Af Amer >60 >60 mL/min   GFR calc Af Amer >60 >60 mL/min    Comment: (NOTE) The eGFR has been calculated using the CKD EPI equation. This calculation has not been validated in all clinical situations. eGFR's persistently <60 mL/min signify possible Chronic Kidney Disease.    Anion gap 8 5 - 15  Ethanol     Status: Abnormal   Collection Time:  11/03/15  1:28 AM  Result Value Ref Range   Alcohol, Ethyl (B) 90 (H) <5 mg/dL    Comment:        LOWEST DETECTABLE LIMIT FOR SERUM ALCOHOL IS 5 mg/dL FOR MEDICAL PURPOSES ONLY   CBC with Diff     Status: Abnormal   Collection Time: 11/03/15  1:28 AM  Result Value Ref Range   WBC 5.9 4.0 - 10.5 K/uL   RBC 3.69 (L) 4.22 - 5.81 MIL/uL   Hemoglobin  12.5 (L) 13.0 - 17.0 g/dL   HCT 33.7 (L) 39.0 - 52.0 %   MCV 91.3 78.0 - 100.0 fL   MCH 33.9 26.0 - 34.0 pg   MCHC >37.0 (H) 30.0 - 36.0 g/dL    Comment: [place]   RDW 13.6 11.5 - 15.5 %   Platelets 67 (L) 150 - 400 K/uL    Comment: SPECIMEN CHECKED FOR CLOTS REPEATED TO VERIFY PLATELET COUNT CONFIRMED BY SMEAR    Neutrophils Relative % 46 %   Lymphocytes Relative 43 %   Monocytes Relative 9 %   Eosinophils Relative 2 %   Basophils Relative 0 %   Neutro Abs 2.8 1.7 - 7.7 K/uL   Lymphs Abs 2.5 0.7 - 4.0 K/uL   Monocytes Absolute 0.5 0.1 - 1.0 K/uL   Eosinophils Absolute 0.1 0.0 - 0.7 K/uL   Basophils Absolute 0.0 0.0 - 0.1 K/uL   RBC Morphology [place]   Acetaminophen level     Status: Abnormal   Collection Time: 11/03/15  1:28 AM  Result Value Ref Range   Acetaminophen (Tylenol), Serum <10 (L) 10 - 30 ug/mL    Comment:        THERAPEUTIC CONCENTRATIONS VARY SIGNIFICANTLY. A RANGE OF 10-30 ug/mL MAY BE AN EFFECTIVE CONCENTRATION FOR MANY PATIENTS. HOWEVER, SOME ARE BEST TREATED AT CONCENTRATIONS OUTSIDE THIS RANGE. ACETAMINOPHEN CONCENTRATIONS >150 ug/mL AT 4 HOURS AFTER INGESTION AND >50 ug/mL AT 12 HOURS AFTER INGESTION ARE OFTEN ASSOCIATED WITH TOXIC REACTIONS.   Salicylate level     Status: None   Collection Time: 11/03/15  1:28 AM  Result Value Ref Range   Salicylate Lvl <3.2 2.8 - 30.0 mg/dL  Urine rapid drug screen (hosp performed)not at [facility]     Status: None   Collection Time: 11/03/15  3:47 AM  Result Value Ref Range   Opiates NONE DETECTED NONE DETECTED   Cocaine NONE DETECTED NONE DETECTED    Benzodiazepines NONE DETECTED NONE DETECTED   Amphetamines NONE DETECTED NONE DETECTED   Tetrahydrocannabinol NONE DETECTED NONE DETECTED   Barbiturates NONE DETECTED NONE DETECTED    Comment:        DRUG SCREEN FOR MEDICAL PURPOSES ONLY.  IF CONFIRMATION IS NEEDED FOR ANY PURPOSE, NOTIFY LAB WITHIN 5 DAYS.        LOWEST DETECTABLE LIMITS FOR URINE DRUG SCREEN Drug Class       Cutoff (ng/mL) Amphetamine      1000 Barbiturate      200 Benzodiazepine   992 Tricyclics       426 Opiates          300 Cocaine          300 THC              50     Current Facility-Administered Medications  Medication Dose Route Frequency Provider Last Rate Last Dose  . acetaminophen (TYLENOL) tablet 650 mg  650 mg Oral Q4H PRN April Palumbo, MD      . alum & mag hydroxide-simeth (MAALOX/MYLANTA) 200-200-20 MG/5ML suspension 30 mL  30 mL Oral PRN April Palumbo, MD      . ibuprofen (ADVIL,MOTRIN) tablet 600 mg  600 mg Oral Q8H PRN April Palumbo, MD      . LORazepam (ATIVAN) tablet 1 mg  1 mg Oral Once Lurena Nida, NP      . ondansetron [facility]) tablet 4 mg  4 mg Oral Q8H PRN April Palumbo, MD       Current Outpatient  Prescriptions  Medication Sig Dispense Refill  . gabapentin (NEURONTIN) 600 MG tablet Take 1 tablet (600 mg total) by mouth 3 (three) times daily. 9 tablet 0  . insulin glargine (LANTUS) 100 UNIT/ML injection Inject 0.3 mLs (30 Units total) into the skin at bedtime. 10 mL 11  . metFORMIN (GLUCOPHAGE) 1000 MG tablet Take 1 tablet (1,000 mg total) by mouth 2 (two) times daily with a meal. 6 tablet 0    Musculoskeletal: Strength & Muscle Tone: Unable to assess; patient seen via telepsych Gait & Station: Unable to assess; patient seen via telepsych Patient leans: Unable to assess; patient seen via telepsych  Psychiatric Specialty Exam: Physical Exam  Nursing note and vitals reviewed.   Review of Systems  Constitutional: Positive for diaphoresis.  Cardiovascular: Negative.    Gastrointestinal: Positive for nausea.  Genitourinary: Negative.   Skin: Negative.   Neurological: Positive for tremors.  Psychiatric/Behavioral: Positive for substance abuse.       See Psychiatric Specialty Exam    Blood pressure 146/84, pulse 85, temperature 97.7 F (36.5 C), temperature source Oral, resp. rate 20, SpO2 95 %.There is no weight on file to calculate BMI.  General Appearance: Fairly Groomed  Eye Contact:  Fair  Speech:  Clear and Coherent and Normal Rate  Volume:  Normal  Mood:  Anxious  Affect:  Congruent  Thought Process:  Coherent  Orientation:  Full (Time, Place, and Person)  Thought Content:  Logical  Suicidal Thoughts:  No  Homicidal Thoughts:  No  Memory:  Immediate;   Good Recent;   Fair Remote;   Fair  Judgement:  Impaired  Insight:  Fair  Psychomotor Activity:  Tremor  Concentration:  Concentration: Fair  Recall:  [employer] of Knowledge:  Fair  Language:  Good  Akathisia:  No  Handed:  Right  AIMS (if indicated):     Assets:  Housing Resilience  ADL's:  Intact  Cognition:  WNL  Sleep:        Treatment Plan Summary: Obtain CIWA Ativan 1 mg PO now x 1 dose  Disposition: Patient does not meet criteria for psychiatric inpatient admission. Supportive therapy provided about ongoing stressors.   Patient has declined to pursue bed placement at [facility]. CIWA is 13. Ativan Detox Protocol initiated. Remain in ED for evaluation by psychiatrist later this morning.   Serena Colonel, FNP-BC [place] 11/03/2015 6:10 AM   Reviewed the information documented and agree with the treatment plan.  [facility] 11/03/2015 12:38 PM

## 2015-11-03 NOTE — [facility] Suicide Risk Assessment (Signed)
Suicide Risk Assessment  Discharge Assessment   [facility] Discharge Suicide Risk Assessment   Principal Problem: Alcohol-induced mood disorder [facility]) Discharge Diagnoses:  Patient Active Problem List   Diagnosis Date Noted  . Alcohol-induced mood disorder ([place]) [F10.94] 10/31/2015    Priority: High  . Alcohol dependence with uncomplicated withdrawal ([place]) [F10.230] 03/03/2015    Priority: High  . Suicidal ideation [R45.851] 11/20/2014    Priority: High  . Substance induced mood disorder ([place]) [F19.94] 06/20/2013    Priority: High  . Alcohol dependence with acute alcoholic intoxication ([place]) [F10.229] 11/06/2011    Priority: High  . Moderate major depression ([place]) [F32.1] 04/21/2015  . Major depressive disorder, single episode, moderate ([place]) [F32.1] 03/10/2015  . Alcohol abuse [F10.10]   . Suicidal ideation [R45.851]   . Anxiousness [F41.9] 09/11/2013  . S/P alcohol detoxification [Z09] 09/27/2012  . Alcohol dependence ([place]) [F10.20] 01/29/2012  . Type II or unspecified type diabetes mellitus without mention of complication, uncontrolled [E11.65] 11/11/2011  . Thrombocytopenia ([place]) [D69.6]   . Hyperglycemia [R73.9]   . Alcoholic ([place]) [X83.38]   . Chronic hyponatremia [E87.1]   . Diabetes mellitus ([place]) [E11.9] 11/06/2011  . Hepatitis C [B19.20] 11/06/2011    Total Time spent with patient: 45 minutes  Musculoskeletal: Strength & Muscle Tone: within normal limits Gait & Station: normal Patient leans: N/A  Psychiatric Specialty Exam: Physical Exam  Constitutional: He is oriented to person, place, and time. He appears well-developed and well-nourished.  HENT:  Head: Normocephalic.  Neck: Normal range of motion.  Respiratory: Effort normal.  Musculoskeletal: Normal range of motion.  Neurological: He is alert and oriented to person, place, and time.  Skin: Skin is warm and dry.  Psychiatric: His speech is normal and behavior is normal. Judgment and thought content normal. His  mood appears anxious. Cognition and memory are normal.    Review of Systems  Constitutional: Negative.   HENT: Negative.   Eyes: Negative.   Respiratory: Negative.   Cardiovascular: Negative.   Gastrointestinal: Negative.   Genitourinary: Negative.   Musculoskeletal: Negative.   Skin: Negative.   Neurological: Negative.   Endo/Heme/Allergies: Negative.   Psychiatric/Behavioral: Positive for substance abuse. The patient is nervous/anxious.     Blood pressure 170/89, pulse 91, temperature 97.7 F (36.5 C), temperature source Oral, resp. rate 18, SpO2 95 %.There is no weight on file to calculate BMI.  General Appearance: Disheveled  Eye Contact:  Fair  Speech:  Normal Rate  Volume:  Normal  Mood:  Anxious  Affect:  Congruent  Thought Process:  Coherent and Descriptions of Associations: Intact  Orientation:  Full (Time, Place, and Person)  Thought Content:  WDL  Suicidal Thoughts:  No  Homicidal Thoughts:  No  Memory:  Immediate;   Fair Recent;   Fair Remote;   Fair  Judgement:  Fair  Insight:  Fair  Psychomotor Activity:  Decreased  Concentration:  Concentration: Fair and Attention Span: Fair  Recall:  [employer] of Knowledge:  Good  Language:  Good  Akathisia:  No  Handed:  Right  AIMS (if indicated):     Assets:  Leisure Time Physical Health  ADL's:  Intact  Cognition:  WNL  Sleep:       Mental Status Per Nursing Assessment::   On Admission:   alcohol intoxication  Demographic Factors:  Male and Caucasian  Loss Factors: NA  Historical Factors: NA  Risk Reduction Factors:   Sense of responsibility to family  Continued Clinical Symptoms:  Anxiety,  mild  Cognitive Features That Contribute To Risk:  None    Suicide Risk:  Minimal: No identifiable suicidal ideation.  Patients presenting with no risk factors but with morbid ruminations; may be classified as minimal risk based on the severity of the depressive symptoms    Plan Of Care/Follow-up  recommendations:  Activity:  as tolerated Diet:  heart healthy diet  Harriett Azar, NP 11/03/2015, 10:14 AM

## 2015-11-13 ENCOUNTER — Encounter (HOSPITAL_COMMUNITY): Payer: Self-pay | Admitting: Emergency Medicine

## 2015-11-13 ENCOUNTER — Emergency Department (HOSPITAL_COMMUNITY)
Admission: EM | Admit: 2015-11-13 | Discharge: 2015-11-14 | Disposition: A | Discharge status: Home / Self Care | Payer: No Typology Code available for payment source | Attending: Emergency Medicine | Admitting: Emergency Medicine

## 2015-11-13 DIAGNOSIS — F10120 Alcohol abuse with intoxication, uncomplicated: Secondary | ICD-10-CM | POA: Insufficient documentation

## 2015-11-13 DIAGNOSIS — Z794 Long term (current) use of insulin: Secondary | ICD-10-CM | POA: Insufficient documentation

## 2015-11-13 DIAGNOSIS — Z79899 Other long term (current) drug therapy: Secondary | ICD-10-CM | POA: Insufficient documentation

## 2015-11-13 DIAGNOSIS — F329 Major depressive disorder, single episode, unspecified: Secondary | ICD-10-CM | POA: Insufficient documentation

## 2015-11-13 DIAGNOSIS — F17210 Nicotine dependence, cigarettes, uncomplicated: Secondary | ICD-10-CM | POA: Insufficient documentation

## 2015-11-13 DIAGNOSIS — E119 Type 2 diabetes mellitus without complications: Secondary | ICD-10-CM | POA: Insufficient documentation

## 2015-11-13 DIAGNOSIS — F10920 Alcohol use, unspecified with intoxication, uncomplicated: Secondary | ICD-10-CM

## 2015-11-13 DIAGNOSIS — Z7984 Long term (current) use of oral hypoglycemic drugs: Secondary | ICD-10-CM | POA: Insufficient documentation

## 2015-11-13 NOTE — ED Notes (Signed)
Patient presents via EMS for detox from ETOH.   Last VS: 113/74, 96hr, 95%ra.

## 2015-11-13 NOTE — ED Notes (Signed)
Bed: [facility] Expected date:  Expected time:  Means of arrival:  Comments:

## 2015-11-13 NOTE — ED Notes (Signed)
Pt presents to ED asking for detox from alcohol. Pt denies SI/HI. Pt's last drink was "a couple hours ago." A&Ox4 and ambulatory. Denies any other drug use.

## 2015-11-13 NOTE — Discharge Instructions (Signed)
Alcohol Use Disorder Alcohol use disorder is a mental disorder. It is not a one-time incident of heavy drinking. Alcohol use disorder is the excessive and uncontrollable use of alcohol over time that leads to problems with functioning in one or more areas of daily living. People with this disorder risk harming themselves and others when they drink to excess. Alcohol use disorder also can cause other mental disorders, such as mood and anxiety disorders, and serious physical problems. People with alcohol use disorder often misuse other drugs.  Alcohol use disorder is common and widespread. Some people with this disorder drink alcohol to cope with or escape from negative life events. Others drink to relieve chronic pain or symptoms of mental illness. People with a family history of alcohol use disorder are at higher risk of losing control and using alcohol to excess.  Drinking too much alcohol can cause injury, accidents, and health problems. One drink can be too much when you are:  Working.  Pregnant or breastfeeding.  Taking medicines. Ask your doctor.  Driving or planning to drive. SYMPTOMS  Signs and symptoms of alcohol use disorder may include the following:   Consumption ofalcohol inlarger amounts or over a longer period of time than intended.  Multiple unsuccessful attempts to cutdown or control alcohol use.   A great deal of time spent obtaining alcohol, using alcohol, or recovering from the effects of alcohol (hangover).  A strong desire or urge to use alcohol (cravings).   Continued use of alcohol despite problems at work, school, or home because of alcohol use.   Continued use of alcohol despite problems in relationships because of alcohol use.  Continued use of alcohol in situations when it is physically hazardous, such as driving a car.  Continued use of alcohol despite awareness of a physical or psychological problem that is likely related to alcohol use. Physical  problems related to alcohol use can involve the brain, heart, liver, stomach, and intestines. Psychological problems related to alcohol use include intoxication, depression, anxiety, psychosis, delirium, and dementia.   The need for increased amounts of alcohol to achieve the same desired effect, or a decreased effect from the consumption of the same amount of alcohol (tolerance).  Withdrawal symptoms upon reducing or stopping alcohol use, or alcohol use to reduce or avoid withdrawal symptoms. Withdrawal symptoms include:  Racing heart.  Hand tremor.  Difficulty sleeping.  Nausea.  Vomiting.  Hallucinations.  Restlessness.  Seizures. DIAGNOSIS Alcohol use disorder is diagnosed through an assessment by your health care provider. Your health care provider may start by asking three or four questions to screen for excessive or problematic alcohol use. To confirm a diagnosis of alcohol use disorder, at least two symptoms must be present within a 87-monthperiod. The severity of alcohol use disorder depends on the number of symptoms:  Mild--two or three.  Moderate--four or five.  Severe--six or more. Your health care provider may perform a physical exam or use results from lab tests to see if you have physical problems resulting from alcohol use. Your health care provider may refer you to a mental health professional for evaluation. TREATMENT  Some people with alcohol use disorder are able to reduce their alcohol use to low-risk levels. Some people with alcohol use disorder need to quit drinking alcohol. When necessary, mental health professionals with specialized training in substance use treatment can help. Your health care provider can help you decide how severe your alcohol use disorder is and what type of treatment you need.  The following forms of treatment are available:   Detoxification. Detoxification involves the use of prescription medicines to prevent alcohol withdrawal  symptoms in the first week after quitting. This is important for people with a history of symptoms of withdrawal and for heavy drinkers who are likely to have withdrawal symptoms. Alcohol withdrawal can be dangerous and, in severe cases, cause death. Detoxification is usually provided in a hospital or in-patient substance use treatment facility.  Counseling or talk therapy. Talk therapy is provided by substance use treatment counselors. It addresses the reasons people use alcohol and ways to keep them from drinking again. The goals of talk therapy are to help people with alcohol use disorder find healthy activities and ways to cope with life stress, to identify and avoid triggers for alcohol use, and to handle cravings, which can cause relapse.  Medicines.Different medicines can help treat alcohol use disorder through the following actions:  Decrease alcohol cravings.  Decrease the positive reward response felt from alcohol use.  Produce an uncomfortable physical reaction when alcohol is used (aversion therapy).  Support groups. Support groups are run by people who have quit drinking. They provide emotional support, advice, and guidance. These forms of treatment are often combined. Some people with alcohol use disorder benefit from intensive combination treatment provided by specialized substance use treatment centers. Both inpatient and outpatient treatment programs are available.   This information is not intended to replace advice given to you by your health care provider. Make sure you discuss any questions you have with your health care provider.   Document Released: 06/03/2004 Document Revised: 05/17/2014 Document Reviewed: 08/03/2012 Elsevier Interactive Patient Education 2016 [employer]. Substance Abuse Treatment Programs  Intensive Outpatient Programs [facility]     [address], [place]       [facility] [place] [address] [place], [place]  [facility]     (Inpatient and outpatient)     [address]           [place] [phone] (Suboxone and Methadone)  [place], [place] [address]      [place] [address] [place], [place]  [facility] [place] (Outpatient/Inpatient, Chemical)    (insurance only) [phone]             [facility] ([place]) [place], [place]     [facility]     [address]     [place], [place]       [facility] (for caregivers and family) [phone] [address] Kristeen [address]. [place], [place]      Residential Treatment Programs [facility]      [address], [place], [place] [address]  [phone]       [facility] [address] [place], [place] [address] [phone]  [facility] [place]        [phone]       [facility] [phone]  [facility] ([place]             [address]  [place], [place] or [place], [address] [phone]  [facility] [place]    [address]      [place], [place]       [facility] [address] [place], [place]  [place]   [address] [place], [place] [address]     [phone]      Admissions: 8am-3pm M-F  [facility] (RTS) [address] [place], [place]  [facility] Program: Residential Program [phone] Days)   [place], [place] or [phone]     ADAT[facility]: [place], [place] (Walk in Hours over the weekend or by  referral)  [facility] [place], [place], [place] [address] [phone]  Crisis Mobile: [facility]:  [phone] (for crisis response 24 hours a day) [facility] Hotline:      [phone] Outpatient Psychiatry and Counseling  [facility]: Mobile Crisis Management 24 hours:  [phone]  [facility] of the [employer] sliding scale fee and walk in schedule: M-F 8am-12pm/1pm-3pm [place], [place] [address] [address] [place], [place] [address] [phone]  [facility] (Formerly known as [facility] [employer])- new patient walk-in appointments available Monday - Friday 8am -3pm.          [address] [place], [place] [address] [phone] or crisis line- [facility]/ Intensive Outpatient Therapy Program [place], [place] [address] [place]      [phone] [address] [place], [place] [address]                 [place]   [facility] [phone]. [place], [place] [address]   [employer] of Care          [address] Johnette Abraham  [place], [place] [address]       [phone]  [facility] [address], [place], [place] [address] [phone]  [facility]    [address] [place], [place] [address]     [place], [place] Joycelyn Man     [place] [address]     [phone]       [facility] [place] [address]  [facility]     [address], [place], [place]  [place], [place]  [address] [place]     [address]     [place], [place] [address]     [phone]       [facility] [address] [place], [place] [address] [phone] Resources for Temporary Residential Assistance/Crisis [place] [facility]) M-F 8am-3pm   [address] [place], [place] [address]   [phone] Services include: laundry, barbering, support groups, case management, phone  & computer access, showers, AA/NA mtgs, mental health/substance abuse nurse, job skills class, disability information, VA assistance, spiritual classes, etc.   [facility]   [address], [place]     [place]              [employer] (women and children)       [place]. [place], [place] [address] [phone] [email or web address] for application and process Application Required  [facility]   [address]    [place] [address]     [phone]                    [place], [place] [address] [phone](WPYKDXIP application appt.) Application Required  [facility] (women only)    [address]     [place], [place] [address]     [phone]      Intake starts 6pm daily Need valid ID, SSC, & Police report [employer] [address] [place], [place] [phone] Application Required  [employer] (men only)     [place].      [place], [place]       [facility] (Pregnant women only) [address] [place], [place]  [facility]      [place].      [place],  [address]     [phone]             [facility] [address] [place], [place] 90 day commitment/SA/Application process  [facility](men only)     [address]     [place], [place]       Check-in at [facility] [address] [place],  [address] [phone] Men/Women/Women and Children must be there by 7 pm  [place], [place]

## 2015-11-13 NOTE — ED Provider Notes (Signed)
CSN: [id number]     Arrival date & time 11/13/15  2154 History  By signing my name below, I, [name], attest that this documentation has been prepared under the direction and in the presence of Orpah Greek, MD. Electronically signed, [name], ED Scribe. 11/14/2015. 12:14 AM.    Chief Complaint  Patient presents with  . Detox     The history is provided by the patient. No language interpreter was used.   HPI Comments: [name] is a 60 y.o. male with a PMHx of etOH abuse, cirrhosis of the liver, DM, depression, who presents to the Emergency Department asking for a detox from alcohol. Pt states he has a Hx of alcohol abuse and relpased 3 weeks ago. Pt reports persistent alcohol problems since he relapsed. Pt states he quit cold [place] 5 months ago. 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 Pt states he slowly started to drink more and more which eventually brought him to come here. Pt is diabetic and states he has been taking his medications as prescribed.   Past Medical History  Diagnosis Date  . Diabetes mellitus   . Chronic hyponatremia     pt denies having   . Alcoholic ([place])   . Alcoholic cirrhosis of liver ([place])     Pt calls this a false diagnosis. Pt denies  . Thrombocytopenia ([place])   . Hyperglycemia   . Depression   . Anxiety   . Alcoholism /alcohol abuse ([place])   . Diabetes mellitus without complication ([place])   . Hepatitis C   . Neuropathy [facility])    Past Surgical History  Procedure Laterality Date  . Plate in lower right leg  2008  . Fracture surgery  right lower leg plate placed years ago   Family History  Problem Relation Age of Onset  . ALS Father    Social History  Substance Use Topics  . Smoking status: Current Every Day Smoker -- 1.00 packs/day for 40 years    Types: Cigarettes  . Smokeless tobacco: None  . Alcohol Use: Yes     Comment: 18/daily     Review of Systems  Unable to perform ROS: Other (asking for alchol detox)    Allergies  Benadryl; Benadryl; Sulfa antibiotics; Tegretol; Librium; Librium; and Sulfa antibiotics  Home Medications   Prior to Admission medications   Medication Sig Start Date End Date Taking? Authorizing Provider  gabapentin (NEURONTIN) 600 MG tablet Take 1 tablet (600 mg total) by mouth 3 (three) times daily. 11/03/15  Yes Patrecia Pour, NP  insulin glargine (LANTUS) 100 UNIT/ML injection Inject 0.3 mLs (30 Units total) into the skin at bedtime. 11/01/15  Yes Patrecia Pour, NP  metFORMIN (GLUCOPHAGE) 1000 MG tablet Take 1 tablet (1,000 mg total) by mouth 2 (two) times daily with a meal. 11/01/15  Yes Patrecia Pour, NP   BP 142/89 mmHg  Pulse 95  Temp(Src) 98.6 F (37 C) (Oral)  Resp 16  SpO2 96% Physical Exam  Constitutional: He is oriented to person, place, and time. He appears well-developed and well-nourished. No distress.  HENT:  Head: Normocephalic and atraumatic.  Right Ear: Hearing normal.  Left Ear: Hearing normal.  Nose: Nose normal.  Mouth/Throat: Oropharynx is clear and moist and mucous membranes are normal.  Eyes: Conjunctivae and EOM are normal. Pupils are equal, round, and reactive to light.  Neck: Normal  range of motion. Neck supple.  Cardiovascular: Regular rhythm, S1 normal and S2 normal.  Exam reveals no gallop and no friction rub.   No murmur heard. Pulmonary/Chest: Effort normal and breath sounds normal. No respiratory distress. He exhibits no tenderness.  Abdominal: Soft. Normal appearance and bowel sounds are normal. There is no hepatosplenomegaly. There is no tenderness. There is no rebound, no guarding, no tenderness at McBurney's point and negative Murphy's sign. No hernia.  Musculoskeletal: Normal range of motion.  Neurological: He is alert and oriented to person, place, and time. He has normal strength. No cranial nerve deficit or sensory deficit. Coordination normal.  GCS eye subscore is 4. GCS verbal subscore is 5. GCS motor subscore is 6.  Slurred  Skin: Skin is warm, dry and intact. No rash noted. No cyanosis.  Psychiatric: He has a normal mood and affect. His behavior is normal. Thought content normal. His speech is slurred.  Slurred speech due to alcohol intoxication  Nursing note and vitals reviewed.   ED Course  Procedures. Labs Review Labs Reviewed  CBC - Abnormal; Notable for the following:    RBC 3.65 (*)    Hemoglobin 12.2 (*)    HCT 33.9 (*)    Platelets 101 (*)    All other components within normal limits  COMPREHENSIVE METABOLIC PANEL - Abnormal; Notable for the following:    Sodium 132 (*)    Chloride 97 (*)    Glucose, Bld 174 (*)    Calcium 8.2 (*)    AST 225 (*)    ALT 130 (*)    All other components within normal limits  ETHANOL - Abnormal; Notable for the following:    Alcohol, Ethyl (B) 288 (*)    All other components within normal limits  URINE RAPID DRUG SCREEN, HOSP PERFORMED    Imaging Review No results found. I have personally reviewed and evaluated these images and lab results as part of my medical decision-making.   EKG Interpretation None      MDM   Final diagnoses:  Alcohol intoxication, uncomplicated ([place])  Alcohol intoxication  Presents to the emergency department with complaints of relapse of alcoholism. Patient reports that he has been sober for 5 months, but in the last 3 weeks has been drinking heavily. He reports that the last time he quit, he quit cold [place] without help. He feels, however, at this time he will need help. He is asking for detox. Patient does have other medical problems. Medical clearance labs were therefore performed to ensure that he did not have any other significant issues. Labs are unremarkable. Patient will be given these labs in the event that he needs them for detox, given resources to follow-up as an outpatient for alcohol detox and rehabilitation.    Orpah Greek, MD 11/14/15 [phone]

## 2015-11-14 LAB — CBC
HEMATOCRIT: 33.9 % — AB (ref 39.0–52.0)
Hemoglobin: 12.2 g/dL — ABNORMAL LOW (ref 13.0–17.0)
MCH: 33.4 pg (ref 26.0–34.0)
MCHC: 36 g/dL (ref 30.0–36.0)
MCV: 92.9 fL (ref 78.0–100.0)
Platelets: 101 10*3/uL — ABNORMAL LOW (ref 150–400)
RBC: 3.65 MIL/uL — ABNORMAL LOW (ref 4.22–5.81)
RDW: 14.3 % (ref 11.5–15.5)
WBC: 4.9 10*3/uL (ref 4.0–10.5)

## 2015-11-14 LAB — RAPID URINE DRUG SCREEN, HOSP PERFORMED
Amphetamines: NOT DETECTED
BENZODIAZEPINES: NOT DETECTED
Barbiturates: NOT DETECTED
COCAINE: NOT DETECTED
OPIATES: NOT DETECTED
Tetrahydrocannabinol: NOT DETECTED

## 2015-11-14 LAB — COMPREHENSIVE METABOLIC PANEL
ALBUMIN: 3.5 g/dL (ref 3.5–5.0)
ALT: 130 U/L — ABNORMAL HIGH (ref 17–63)
ANION GAP: 9 (ref 5–15)
AST: 225 U/L — ABNORMAL HIGH (ref 15–41)
Alkaline Phosphatase: 102 U/L (ref 38–126)
BILIRUBIN TOTAL: 0.8 mg/dL (ref 0.3–1.2)
BUN: 13 mg/dL (ref 6–20)
CO2: 26 mmol/L (ref 22–32)
Calcium: 8.2 mg/dL — ABNORMAL LOW (ref 8.9–10.3)
Chloride: 97 mmol/L — ABNORMAL LOW (ref 101–111)
Creatinine, Ser: 0.8 mg/dL (ref 0.61–1.24)
GLUCOSE: 174 mg/dL — AB (ref 65–99)
POTASSIUM: 4.5 mmol/L (ref 3.5–5.1)
Sodium: 132 mmol/L — ABNORMAL LOW (ref 135–145)
TOTAL PROTEIN: 7.3 g/dL (ref 6.5–8.1)

## 2015-11-14 LAB — ETHANOL: ALCOHOL ETHYL (B): 288 mg/dL — AB (ref <5)

## 2017-12-11 ENCOUNTER — Encounter (HOSPITAL_COMMUNITY): Payer: Self-pay | Admitting: Emergency Medicine

## 2017-12-11 ENCOUNTER — Other Ambulatory Visit: Payer: Self-pay

## 2017-12-11 ENCOUNTER — Ambulatory Visit (HOSPITAL_COMMUNITY)
Admission: EM | Admit: 2017-12-11 | Discharge: 2017-12-11 | Disposition: A | Discharge status: Home / Self Care | Payer: Self-pay | Attending: Family Medicine | Admitting: Family Medicine

## 2017-12-11 DIAGNOSIS — Z760 Encounter for issue of repeat prescription: Secondary | ICD-10-CM

## 2017-12-11 DIAGNOSIS — E1140 Type 2 diabetes mellitus with diabetic neuropathy, unspecified: Secondary | ICD-10-CM

## 2017-12-11 MED ORDER — GABAPENTIN 400 MG PO CAPS: 1200.0000 mg | ORAL_CAPSULE | Freq: Three times a day (TID) | ORAL | 0 refills | Status: DC

## 2017-12-11 NOTE — ED Triage Notes (Signed)
The patient presented to the [facility] with a complaint of needing a refill on his gabapentin.

## 2017-12-11 NOTE — ED Provider Notes (Signed)
[facility]    CSN: [id number] Arrival date & time: 12/11/17  1247     History   Chief Complaint Chief Complaint  Patient presents with  . Medication Refill    HPI [name] is a 62 y.o. male.   HPI  Patient is here for medicine refill.  He usually picks up his Neurontin month-to-month with his primary care doctor.  Unfortunately, he was unable to see his primary care doctor within the month and has an appointment for next week.  He ran out of his Neurontin yesterday.  He takes quite a large dose, 1600 mg 3 times daily.  He is not sedated.  He states this is necessary for diabetic neuropathy. He knows that 4800 mg a day is a high dose, but has been on it successfully for some time.  Does bring the pill bottle and that confirms the correct dose.  Past Medical History:  Diagnosis Date  . Alcoholic ([place])   . Alcoholic cirrhosis of liver ([place])    Pt calls this a false diagnosis. Pt denies  . Alcoholism /alcohol abuse ([place])   . Anxiety   . Chronic hyponatremia    pt denies having   . Depression   . Diabetes mellitus   . Diabetes mellitus without complication ([place])   . Hepatitis C   . Hyperglycemia   . Neuropathy   . Thrombocytopenia [facility])     Patient Active Problem List   Diagnosis Date Noted  . Alcohol-induced mood disorder ([place]) 10/31/2015  . Moderate major depression ([place]) 04/21/2015  . Major depressive disorder, single episode, moderate ([place]) 03/10/2015  . Alcohol dependence with uncomplicated withdrawal ([place]) 03/03/2015  . Alcohol abuse   . Suicidal ideation   . Suicidal ideation 11/20/2014  . Anxiousness 09/11/2013  . Substance induced mood disorder ([place]) 06/20/2013  . S/P alcohol detoxification 09/27/2012  . Alcohol dependence ([place]) 01/29/2012  . Type II or unspecified type diabetes mellitus without mention of complication, uncontrolled 11/11/2011  . Thrombocytopenia ([place])   . Hyperglycemia   . Alcoholic ([place])   . Chronic hyponatremia   .  Alcohol dependence with acute alcoholic intoxication ([place]) 11/06/2011  . Diabetes mellitus ([place]) 11/06/2011  . Hepatitis C 11/06/2011    Past Surgical History:  Procedure Laterality Date  . FRACTURE SURGERY  right lower leg plate placed years ago  . Plate in lower right leg  2008       Home Medications    Prior to Admission medications   Medication Sig Start Date End Date Taking? Authorizing Provider  insulin glargine (LANTUS) 100 UNIT/ML injection Inject 0.3 mLs (30 Units total) into the skin at bedtime. 11/01/15  Yes Patrecia Pour, NP  metFORMIN (GLUCOPHAGE) 1000 MG tablet Take 1 tablet (1,000 mg total) by mouth 2 (two) times daily with a meal. 11/01/15  Yes Lord, Asa Saunas, NP  gabapentin (NEURONTIN) 400 MG capsule Take 3 capsules (1,200 mg total) by mouth 3 (three) times daily. 12/11/17   Raylene Everts, MD    Family History Family History  Problem Relation Age of Onset  . ALS Father     Social History Social History   Tobacco Use  . Smoking status: Current Every Day Smoker    Packs/day: 1.00    Years: 40.00    Pack years: 40.00    Types: Cigarettes  Substance Use Topics  . Alcohol use: Yes    Comment: 18/daily  . Drug use: No     Allergies  Benadryl [diphenhydramine hcl]; Benadryl [diphenhydramine]; Sulfa antibiotics; Tegretol [carbamazepine]; Librium [chlordiazepoxide hcl]; Librium [chlordiazepoxide]; and Sulfa antibiotics   Review of Systems Review of Systems  Constitutional: Negative for chills and fever.  HENT: Negative for ear pain and sore throat.   Eyes: Negative for pain and visual disturbance.  Respiratory: Negative for cough and shortness of breath.   Cardiovascular: Negative for chest pain and palpitations.  Gastrointestinal: Negative for abdominal pain and vomiting.  Genitourinary: Negative for dysuria and hematuria.  Musculoskeletal: Negative for arthralgias and back pain.  Skin: Negative for color change and rash.  Neurological:  Negative for seizures and syncope.       Numbness and pain in his feet from neuropathy  All other systems reviewed and are negative.    Physical Exam Triage Vital Signs ED Triage Vitals [12/11/17 1320]  Enc Vitals Group     BP (!) 138/97     Pulse Rate 98     Resp 18     Temp 98.9 F (37.2 C)     Temp Source Oral     SpO2 99 %     Weight      Height      Head Circumference      Peak Flow      Pain Score 10     Pain Loc      Pain Edu?      Excl. in [place]?    No data found.  Updated Vital Signs BP (!) 138/97 (BP Location: Left Arm)   Pulse 98   Temp 98.9 F (37.2 C) (Oral)   Resp 18   SpO2 99%   Visual Acuity Right Eye Distance:   Left Eye Distance:   Bilateral Distance:    Right Eye Near:   Left Eye Near:    Bilateral Near:     Physical Exam  Constitutional: He appears well-developed and well-nourished. No distress.  HENT:  Head: Normocephalic and atraumatic.  Mouth/Throat: Oropharynx is clear and moist.  Eyes: Pupils are equal, round, and reactive to light. Conjunctivae are normal.  Neck: Normal range of motion.  Cardiovascular: Normal rate.  Pulmonary/Chest: Effort normal. No respiratory distress.  Abdominal: Soft. He exhibits no distension.  Musculoskeletal: Normal range of motion. He exhibits no edema.  Neurological: He is alert.  Skin: Skin is warm and dry.  Psychiatric: He has a normal mood and affect. His behavior is normal.     UC Treatments / Results  Labs (all labs ordered are listed, but only abnormal results are displayed) Labs Reviewed - No data to display  EKG None  Radiology No results found.  Procedures Procedures (including critical care time)  Medications Ordered in UC Medications - No data to display  Initial Impression / Assessment and Plan / UC Course  I have reviewed the triage vital signs and the nursing notes.  Pertinent labs & imaging results that were available during my care of the patient were reviewed by me and  considered in my medical decision making (see chart for details).    Patient is given 5 days of medication.  He is advised to call his primary care doctor on Monday. Final Clinical Impressions(s) / UC Diagnoses   Final diagnoses:  Encounter for medication refill     Discharge Instructions     Medicine refilled See your PCP as scheduled next week   ED Prescriptions    Medication Sig Dispense Auth. Provider   gabapentin (NEURONTIN) 400 MG capsule Take 3 capsules (1,200 mg  total) by mouth 3 (three) times daily. 60 capsule Raylene Everts, MD     Controlled Substance Prescriptions [place] Controlled Substance Registry consulted? Not Applicable  Raylene Everts, MD 12/11/17 [phone]

## 2017-12-11 NOTE — Discharge Instructions (Signed)
Medicine refilled See your PCP as scheduled next week

## 2018-01-11 DIAGNOSIS — B182 Chronic viral hepatitis C: Secondary | ICD-10-CM | POA: Insufficient documentation

## 2018-01-11 NOTE — Progress Notes (Signed)
Subjective:    Patient ID: [name], male    DOB: 07-Feb-1956, 62 y.o.   MRN: [id number]  Chief Complaint  Patient presents with  . Hepatitis C    HPI:  [name] is a 62 y.o. male who presents today for initial office visit for evaluation of Hepatitis C.  [name] was initially diagnosed with Hepatitis C about 10 years ago during a detoxification from alcohol. He has never been treated. He does have a history of IV drug use in his 30's. He does have tattoos. Denies blood transfusion before 1992, sexual encounter with an infected partner, or sharing of razors/toothbrushes. No personal or family history of liver disease. He has been sober from alcohol usage for the past 2 years with no relapses. He does continue to use marijuana nightly to help him sleep. Denies other recreational or illicit drug use. Does have nausea with no other abdominal pain, vomiting, constipation or diarrhea.   Allergies  Allergen Reactions  . Benadryl [Diphenhydramine Hcl] Other (See Comments)    "Causes nervousness"  . Benadryl [Diphenhydramine] Other (See Comments)    "makes my skin crawl"  . Sulfa Antibiotics Other (See Comments)    Childhood allergy  . Tegretol [Carbamazepine] Other (See Comments)    "almost killed me"  . Librium [Chlordiazepoxide Hcl] Anxiety  . Librium [Chlordiazepoxide] Anxiety  . Sulfa Antibiotics Rash      Outpatient Medications Prior to Visit  Medication Sig Dispense Refill  . gabapentin (NEURONTIN) 400 MG capsule Take 3 capsules (1,200 mg total) by mouth 3 (three) times daily. (Patient taking differently: Take 1,600 mg by mouth 3 (three) times daily. ) 60 capsule 0  . insulin glargine (LANTUS) 100 UNIT/ML injection Inject 0.3 mLs (30 Units total) into the skin at bedtime. (Patient taking differently: Inject 45 Units into the skin at bedtime. ) 10 mL 11  . metFORMIN (GLUCOPHAGE) 1000 MG tablet Take 1 tablet (1,000 mg total) by mouth 2 (two) times daily with a  meal. 6 tablet 0   No facility-administered medications prior to visit.      Past Medical History:  Diagnosis Date  . Alcoholic ([place])   . Alcoholic cirrhosis of liver ([place])    Pt calls this a false diagnosis. Pt denies  . Alcoholism /alcohol abuse ([place])   . Anxiety   . Chronic hyponatremia    pt denies having   . Depression   . Diabetes mellitus   . Diabetes mellitus without complication ([place])   . Hepatitis C   . Hyperglycemia   . Neuropathy   . Substance abuse ([place])   . Thrombocytopenia ([place])       Past Surgical History:  Procedure Laterality Date  . FRACTURE SURGERY  right lower leg plate placed years ago  . Plate in lower right leg  2008      Family History  Problem Relation Age of Onset  . ALS Father       Social History   Socioeconomic History  . Marital status: Divorced    Spouse name: Not on file  . Number of children: Not on file  . Years of education: Not on file  . Highest education level: Not on file  Occupational History  . Not on file  Social Needs  . Financial resource strain: Not on file  . Food insecurity:    Worry: Not on file    Inability: Not on file  . Transportation needs:    Medical: Not on  file    Non-medical: Not on file  Tobacco Use  . Smoking status: Current Every Day Smoker    Packs/day: 1.00    Years: 40.00    Pack years: 40.00    Types: Cigarettes  . Smokeless tobacco: Never Used  Substance and Sexual Activity  . Alcohol use: Not Currently    Comment: 18/daily- reports has not drink in 2 years  . Drug use: Yes    Frequency: 7.0 times per week    Types: Marijuana    Comment: for sleep  . Sexual activity: Not Currently  Lifestyle  . Physical activity:    Days per week: Not on file    Minutes per session: Not on file  . Stress: Not on file  Relationships  . Social connections:    Talks on phone: Not on file    Gets together: Not on file    Attends religious service: Not on file    Active member of club or  organization: Not on file    Attends meetings of clubs or organizations: Not on file    Relationship status: Not on file  . Intimate partner violence:    Fear of current or ex partner: Not on file    Emotionally abused: Not on file    Physically abused: Not on file    Forced sexual activity: Not on file  Other Topics Concern  . Not on file  Social History Narrative   ** Merged History Encounter **          Review of Systems  Constitutional: Negative for chills, diaphoresis, fatigue and fever.  Respiratory: Negative for cough, chest tightness, shortness of breath and wheezing.   Cardiovascular: Negative for chest pain.  Gastrointestinal: Negative for abdominal distention, abdominal pain, constipation, diarrhea, nausea and vomiting.  Neurological: Negative for weakness and headaches.  Hematological: Does not bruise/bleed easily.       Objective:    BP 125/74   Pulse 81   Temp 98.3 F (36.8 C)   Ht 6' (1.829 m)   Wt 172 lb (78 kg)   BMI 23.33 kg/m  Nursing note and vital signs reviewed.  Physical Exam  Constitutional: He is oriented to person, place, and time. He appears well-developed. No distress.  Cardiovascular: Normal rate, regular rhythm, normal heart sounds and intact distal pulses. Exam reveals no gallop and no friction rub.  No murmur heard. Pulmonary/Chest: Effort normal and breath sounds normal. No respiratory distress. He has no wheezes. He has no rales. He exhibits no tenderness.  Abdominal: Soft. Bowel sounds are normal. He exhibits no distension, no ascites and no mass. There is no hepatosplenomegaly. There is no tenderness. There is no rebound and no guarding.  Neurological: He is alert and oriented to person, place, and time.  Skin: Skin is warm and dry.  Psychiatric: He has a normal mood and affect. His behavior is normal. Judgment and thought content normal.        Assessment & Plan:   Problem List Items Addressed This Visit      Other    Hepatitis C antibody test positive    [name] was diagnosed with Hepatitis C about 10 years ago and remains treatment naive. Believes he acquired Hepatitis C from IV drug use in his 20's. He is a recovering alcoholic and has been sober for 2 years. He does have nausea without abdominal pain. We discussed methods of transmission and prevention of transmission, Hepatitis risk if left untreated, and medications.  We will obtain his Hepatitis C genotype, viral load, hepatic panel, and Hepatitis B blood work. Schedule for elastography. Influenza and Pneumovax updated today. Plan for Epclusa, Mavyret, or Harvoni pending lab and imaging results. He met with our pharmacy staff to complete financial assistance paperwork.  Follow up pending results.       Relevant Orders   Hepatitis C RNA quantitative   Hepatitis C genotype   Hepatic function panel   Basic Metabolic Panel (BMET)   US ABDOMEN COMPLETE W/ELASTOGRAPHY   Hepatitis B surface antigen   Hepatitis B surface antibody,qualitative   Pneumococcal polysaccharide vaccine 23-valent greater than or equal to 2yo subcutaneous/IM (Completed)    Other Visit Diagnoses    Need for pneumococcal vaccination    -  Primary   Relevant Orders   Pneumococcal polysaccharide vaccine 23-valent greater than or equal to 2yo subcutaneous/IM (Completed)   Need for immunization against influenza       Relevant Orders   Flu Vaccine QUAD 36+ mos IM (Completed)       I am having [name]. Avans maintain his metFORMIN, insulin glargine, and gabapentin.    Follow-up: Pending blood work and imaging results.    Terri Piedra, MSN, FNP-C Nurse Practitioner [facility] for Infectious [facility] [place] Office phone: [phone] Pager: [place] number: [phone]

## 2018-01-12 ENCOUNTER — Ambulatory Visit (INDEPENDENT_AMBULATORY_CARE_PROVIDER_SITE_OTHER): Payer: Self-pay | Admitting: Family

## 2018-01-12 ENCOUNTER — Encounter: Payer: Self-pay | Admitting: Family

## 2018-01-12 VITALS — BP 125/74 | HR 81 | Temp 98.3°F | Ht 72.0 in | Wt 172.0 lb

## 2018-01-12 DIAGNOSIS — R7689 Other specified abnormal immunological findings in serum: Secondary | ICD-10-CM

## 2018-01-12 DIAGNOSIS — Z23 Encounter for immunization: Secondary | ICD-10-CM

## 2018-01-12 DIAGNOSIS — R768 Other specified abnormal immunological findings in serum: Secondary | ICD-10-CM

## 2018-01-12 NOTE — Patient Instructions (Signed)
Nice to meet you!  We will check your blood work today and schedule you for an ultrasound of your liver.   Limit acetaminophen (Tylenol) usage to no more than 2 grams (2,000 mg) per day.  Avoid alcohol.  Do not share toothbrushes or razors.  Practice safe sex to protect against transmission as well as sexually transmitted disease.    Hepatitis C Hepatitis C is a viral infection of the liver. It can lead to scarring of the liver (cirrhosis), liver failure, or liver cancer. Hepatitis C may go undetected for months or years because people with the infection may not have symptoms, or they may have only mild symptoms. What are the causes? This condition is caused by the hepatitis C virus (HCV). The virus can spread from person to person (is contagious) through:  Blood.  Childbirth. A woman who has hepatitis C can pass it to her baby during birth.  Bodily fluids, such as breast milk, tears, semen, vaginal fluids, and saliva.  Blood transfusions or organ transplants done in the [place] before 1992.  What increases the risk? The following factors may make you more likely to develop this condition:  Having contact with unclean (contaminated) needles or syringes. This may result from: ? Acupuncture. ? Tattoing. ? Body piercing. ? Injecting drugs.  Having unprotected sex with someone who is infected.  Needing treatment to filter your blood (kidney dialysis).  Having HIV (human immunodeficiency virus) or AIDS (acquired immunodeficiency syndrome).  Working in a job that involves contact with blood or bodily fluids, such as health care.  What are the signs or symptoms? Symptoms of this condition include:  Fatigue.  Loss of appetite.  Nausea.  Vomiting.  Abdominal pain.  Dark yellow urine.  Yellowish skin and eyes (jaundice).  Itchy skin.  Clay-colored bowel movements.  Joint pain.  Bleeding and bruising easily.  Fluid building up in your stomach  (ascites).  In some cases, you may not have any symptoms. How is this diagnosed? This condition is diagnosed with:  Blood tests.  Other tests to check how well your liver is functioning. They may include: ? Magnetic resonance elastography (MRE). This imaging test uses MRIs and sound waves to measure liver stiffness. ? Transient elastography. This imaging test uses ultrasounds to measure liver stiffness. ? Liver biopsy. This test requires taking a small tissue sample from your liver to examine it under a microscope.  How is this treated? Your health care provider may perform noninvasive tests or a liver biopsy to help decide the best course of treatment. Treatment may include:  Antiviral medicines and other medicines.  Follow-up treatments every 6-12 months for infections or other liver conditions.  Receiving a donated liver (liver transplant).  Follow these instructions at home: Medicines  Take over-the-counter and prescription medicines only as told by your health care provider.  Take your antiviral medicine as told by your health care provider. Do not stop taking the antiviral even if you start to feel better.  Do not take any medicines unless approved by your health care provider, including over-the-counter medicines and birth control pills. Activity  Rest as needed.  Do not have sex unless approved by your health care provider.  Ask your health care provider when you may return to school or work. Eating and drinking  Eat a balanced diet with plenty of fruits and vegetables, whole grains, and lowfat (lean) meats or non-meat proteins (such as beans or tofu).  Drink enough fluids to keep your urine clear  or pale yellow.  Do not drink alcohol. General instructions  Do not share toothbrushes, nail clippers, or razors.  Wash your hands frequently with soap and water. If soap and water are not available, use hand sanitizer.  Cover any cuts or open sores on your skin to  prevent spreading the virus.  Keep all follow-up visits as told by your health care provider. This is important. You may need follow-up visits every 6-12 months. How is this prevented? There is no vaccine for hepatitis C. The only way to prevent the disease is to reduce the risk of exposure to the virus. Make sure you:  Wash your hands frequently with soap and water. If soap and water are not available, use hand sanitizer.  Do not share needles or syringes.  Practice safe sex and use condoms.  Avoid handling blood or bodily fluids without gloves or other protection.  Avoid getting tattoos or piercings in shops or other locations that are not clean.  Contact a health care provider if:  You have a fever.  You develop abdominal pain.  You pass dark urine.  You pass clay-colored stools.  You develop joint pain. Get help right away if:  You have increasing fatigue or weakness.  You lose your appetite.  You cannot eat or drink without vomiting.  You develop jaundice or your jaundice gets worse.  You bruise or bleed easily. Summary  Hepatitis C is a viral infection of the liver. It can lead to scarring of the liver (cirrhosis), liver failure, or liver cancer.  The hepatitis C virus (HCV) causes this condition. The virus can pass from person to person (is contagious).  You should not take any medicines unless approved by your health care provider. This includes over-the-counter medicines and birth control pills. This information is not intended to replace advice given to you by your health care provider. Make sure you discuss any questions you have with your health care provider. Document Released: 04/23/2000 Document Revised: 06/01/2016 Document Reviewed: 06/01/2016 Elsevier Interactive Patient Education  [employer].

## 2018-01-12 NOTE — Progress Notes (Signed)
Administered Flu vaccine and pneumovax 23, patient tolerated well.

## 2018-01-12 NOTE — Assessment & Plan Note (Addendum)
Mr. Keesling was diagnosed with Hepatitis C about 10 years ago and remains treatment naive. Believes he acquired Hepatitis C from IV drug use in his 20's. He is a recovering alcoholic and has been sober for 2 years. He does have nausea without abdominal pain. We discussed methods of transmission and prevention of transmission, Hepatitis risk if left untreated, and medications. We will obtain his Hepatitis C genotype, viral load, hepatic panel, and Hepatitis B blood work. Schedule for elastography. Influenza and Pneumovax updated today. Plan for Epclusa, Mavyret, or Harvoni pending lab and imaging results. He met with our pharmacy staff to complete financial assistance paperwork.  Follow up pending results.

## 2018-01-15 ENCOUNTER — Encounter (HOSPITAL_COMMUNITY): Payer: Self-pay

## 2018-01-15 ENCOUNTER — Ambulatory Visit (HOSPITAL_COMMUNITY)
Admission: EM | Admit: 2018-01-15 | Discharge: 2018-01-15 | Disposition: A | Discharge status: Home / Self Care | Payer: Self-pay | Attending: Internal Medicine | Admitting: Internal Medicine

## 2018-01-15 DIAGNOSIS — G629 Polyneuropathy, unspecified: Secondary | ICD-10-CM

## 2018-01-15 DIAGNOSIS — Z760 Encounter for issue of repeat prescription: Secondary | ICD-10-CM

## 2018-01-15 MED ORDER — GABAPENTIN 400 MG PO CAPS: 1600.0000 mg | ORAL_CAPSULE | Freq: Three times a day (TID) | ORAL | 0 refills | Status: DC

## 2018-01-15 NOTE — ED Triage Notes (Signed)
Pt presents to get medication refill . ( Gabapentin)

## 2018-01-15 NOTE — ED Provider Notes (Signed)
[facility]    CSN: [id number] Arrival date & time: 01/15/18  1353     History   Chief Complaint Chief Complaint  Patient presents with  . Medication Refill    Gabapentin    HPI [name] is a 62 y.o. male history of alcohol abuse, DM type II, neuropathy presenting today for evaluation for medication refill.  Patient is wishing to have his gabapentin refilled.  He takes 1600 mg 3 times a day.  He has been on this dose for a while.  He typically gets this prescribed month-to-month by his PCP.  His next appointment is 912 which is slightly over the 30-day supply.  He is wishing to have a refill to hold him over until he can follow-up with his PCP this upcoming week.  Declines issues with medicine.  HPI  Past Medical History:  Diagnosis Date  . Alcoholic ([place])   . Alcoholic cirrhosis of liver ([place])    Pt calls this a false diagnosis. Pt denies  . Alcoholism /alcohol abuse ([place])   . Anxiety   . Chronic hyponatremia    pt denies having   . Depression   . Diabetes mellitus   . Diabetes mellitus without complication ([place])   . Hepatitis C   . Hyperglycemia   . Neuropathy   . Substance abuse ([place])   . Thrombocytopenia [facility])     Patient Active Problem List   Diagnosis Date Noted  . Hepatitis C antibody test positive 01/11/2018  . Alcohol-induced mood disorder ([place]) 10/31/2015  . Moderate major depression ([place]) 04/21/2015  . Major depressive disorder, single episode, moderate ([place]) 03/10/2015  . Alcohol dependence with uncomplicated withdrawal ([place]) 03/03/2015  . Alcohol abuse   . Suicidal ideation   . Suicidal ideation 11/20/2014  . Anxiousness 09/11/2013  . Substance induced mood disorder ([place]) 06/20/2013  . S/P alcohol detoxification 09/27/2012  . Alcohol dependence ([place]) 01/29/2012  . Type II or unspecified type diabetes mellitus without mention of complication, uncontrolled 11/11/2011  . Thrombocytopenia ([place])   . Hyperglycemia   . Alcoholic ([place])    . Chronic hyponatremia   . Alcohol dependence with acute alcoholic intoxication ([place]) 11/06/2011  . Diabetes mellitus ([place]) 11/06/2011  . Hepatitis C 11/06/2011    Past Surgical History:  Procedure Laterality Date  . FRACTURE SURGERY  right lower leg plate placed years ago  . Plate in lower right leg  2008       Home Medications    Prior to Admission medications   Medication Sig Start Date End Date Taking? Authorizing Provider  gabapentin (NEURONTIN) 400 MG capsule Take 4 capsules (1,600 mg total) by mouth 3 (three) times daily for 7 days. 01/15/18 01/22/18  Wieters, Hallie C, PA-C  insulin glargine (LANTUS) 100 UNIT/ML injection Inject 0.3 mLs (30 Units total) into the skin at bedtime. Patient taking differently: Inject 45 Units into the skin at bedtime.  11/01/15   Patrecia Pour, NP  metFORMIN (GLUCOPHAGE) 1000 MG tablet Take 1 tablet (1,000 mg total) by mouth 2 (two) times daily with a meal. 11/01/15   Patrecia Pour, NP    Family History Family History  Problem Relation Age of Onset  . ALS Father     Social History Social History   Tobacco Use  . Smoking status: Current Every Day Smoker    Packs/day: 1.00    Years: 40.00    Pack years: 40.00    Types: Cigarettes  . Smokeless tobacco: Never Used  Substance Use Topics  . Alcohol use: Not Currently    Comment: 18/daily- reports has not drink in 2 years  . Drug use: Yes    Frequency: 7.0 times per week    Types: Marijuana    Comment: for sleep     Allergies   Benadryl [diphenhydramine hcl]; Benadryl [diphenhydramine]; Sulfa antibiotics; Tegretol [carbamazepine]; Librium [chlordiazepoxide hcl]; Librium [chlordiazepoxide]; and Sulfa antibiotics   Review of Systems Review of Systems  Constitutional: Negative for fatigue and fever.  HENT: Negative for congestion, sinus pressure and sore throat.   Eyes: Negative for photophobia, pain and visual disturbance.  Respiratory: Negative for cough and shortness of  breath.   Cardiovascular: Negative for chest pain.  Gastrointestinal: Negative for abdominal pain, nausea and vomiting.  Genitourinary: Negative for decreased urine volume and hematuria.  Musculoskeletal: Negative for myalgias, neck pain and neck stiffness.  Neurological: Positive for numbness. Negative for dizziness, syncope, facial asymmetry, speech difficulty, weakness, light-headedness and headaches.     Physical Exam Triage Vital Signs ED Triage Vitals [01/15/18 1413]  Enc Vitals Group     BP (!) 165/83     Pulse Rate 85     Resp 20     Temp 98.3 F (36.8 C)     Temp Source Oral     SpO2 96 %     Weight      Height      Head Circumference      Peak Flow      Pain Score      Pain Loc      Pain Edu?      Excl. in [place]?    No data found.  Updated Vital Signs BP (!) 165/83 (BP Location: Right Arm)   Pulse 85   Temp 98.3 F (36.8 C) (Oral)   Resp 20   SpO2 96%   Visual Acuity Right Eye Distance:   Left Eye Distance:   Bilateral Distance:    Right Eye Near:   Left Eye Near:    Bilateral Near:     Physical Exam  Constitutional: He is oriented to person, place, and time. He appears well-developed and well-nourished.  No acute distress  HENT:  Head: Normocephalic and atraumatic.  Nose: Nose normal.  Eyes: Conjunctivae are normal.  Neck: Neck supple.  Cardiovascular: Normal rate.  Pulmonary/Chest: Effort normal. No respiratory distress.  Abdominal: He exhibits no distension.  Musculoskeletal: Normal range of motion.  Neurological: He is alert and oriented to person, place, and time.  Skin: Skin is warm and dry.  Psychiatric: He has a normal mood and affect.  Nursing note and vitals reviewed.    UC Treatments / Results  Labs (all labs ordered are listed, but only abnormal results are displayed) Labs Reviewed - No data to display  EKG None  Radiology No results found.  Procedures Procedures (including critical care time)  Medications Ordered in  UC Medications - No data to display  Initial Impression / Assessment and Plan / UC Course  I have reviewed the triage vital signs and the nursing notes.  Pertinent labs & imaging results that were available during my care of the patient were reviewed by me and considered in my medical decision making (see chart for details).     We will refill patient's gabapentin, provided 7 days worth to hold him over until 9/12 to follow-up with his PCP.  Discussed following up if starting to have issues with medicines, worsening pain or numbness.Discussed strict return precautions.  Patient verbalized understanding and is agreeable with plan.  Final Clinical Impressions(s) / UC Diagnoses   Final diagnoses:  Neuropathy  Medication refill     Discharge Instructions     I have given you 1 week refill of gabapentin Follow up with your primary on 9/12 as planned  Follow up if developing issues with medicine, worsening pain, numbness   ED Prescriptions    Medication Sig Dispense Auth. Provider   gabapentin (NEURONTIN) 400 MG capsule Take 4 capsules (1,600 mg total) by mouth 3 (three) times daily for 7 days. 84 capsule Wieters, Hallie C, PA-C     Controlled Substance Prescriptions [place] Controlled Substance Registry consulted? Not Applicable   Janith Lima, [place] 01/15/18 1554

## 2018-01-15 NOTE — Discharge Instructions (Signed)
I have given you 1 week refill of gabapentin Follow up with your primary on 9/12 as planned  Follow up if developing issues with medicine, worsening pain, numbness

## 2018-01-18 LAB — HEPATITIS C RNA QUANTITATIVE
HCV QUANT LOG: 5.06 {Log_IU}/mL — AB
HCV RNA, PCR, QN: 116000 IU/mL — ABNORMAL HIGH

## 2018-01-18 LAB — BASIC METABOLIC PANEL
BUN: 21 mg/dL (ref 7–25)
CO2: 27 mmol/L (ref 20–32)
CREATININE: 0.81 mg/dL (ref 0.70–1.25)
Calcium: 8.1 mg/dL — ABNORMAL LOW (ref 8.6–10.3)
Chloride: 101 mmol/L (ref 98–110)
Glucose, Bld: 164 mg/dL — ABNORMAL HIGH (ref 65–99)
POTASSIUM: 4.4 mmol/L (ref 3.5–5.3)
Sodium: 135 mmol/L (ref 135–146)

## 2018-01-18 LAB — HEPATIC FUNCTION PANEL
AG Ratio: 0.8 (calc) — ABNORMAL LOW (ref 1.0–2.5)
ALBUMIN MSPROF: 2.8 g/dL — AB (ref 3.6–5.1)
ALT: 46 U/L (ref 9–46)
AST: 80 U/L — ABNORMAL HIGH (ref 10–35)
Alkaline phosphatase (APISO): 134 U/L — ABNORMAL HIGH (ref 40–115)
BILIRUBIN TOTAL: 0.3 mg/dL (ref 0.2–1.2)
Bilirubin, Direct: 0.1 mg/dL (ref 0.0–0.2)
GLOBULIN: 3.6 g/dL (ref 1.9–3.7)
Indirect Bilirubin: 0.2 mg/dL (calc) (ref 0.2–1.2)
TOTAL PROTEIN: 6.4 g/dL (ref 6.1–8.1)

## 2018-01-18 LAB — HEPATITIS B SURFACE ANTIBODY,QUALITATIVE: Hep B S Ab: NONREACTIVE

## 2018-01-18 LAB — HEPATITIS C GENOTYPE: HCV GENOTYPE: 2

## 2018-01-18 LAB — HEPATITIS B SURFACE ANTIGEN: Hepatitis B Surface Ag: NONREACTIVE

## 2018-01-24 ENCOUNTER — Other Ambulatory Visit: Payer: Self-pay | Admitting: Pharmacist

## 2018-01-24 ENCOUNTER — Other Ambulatory Visit: Payer: Self-pay | Admitting: Family

## 2018-01-24 ENCOUNTER — Ambulatory Visit (HOSPITAL_COMMUNITY)
Admission: RE | Admit: 2018-01-24 | Discharge: 2018-01-24 | Disposition: A | Discharge status: Home / Self Care | Payer: Self-pay | Source: Ambulatory Visit | Attending: Family | Admitting: Family

## 2018-01-24 DIAGNOSIS — R932 Abnormal findings on diagnostic imaging of liver and biliary tract: Secondary | ICD-10-CM | POA: Insufficient documentation

## 2018-01-24 DIAGNOSIS — K760 Fatty (change of) liver, not elsewhere classified: Secondary | ICD-10-CM | POA: Insufficient documentation

## 2018-01-24 DIAGNOSIS — R768 Other specified abnormal immunological findings in serum: Secondary | ICD-10-CM | POA: Insufficient documentation

## 2018-01-24 DIAGNOSIS — B182 Chronic viral hepatitis C: Secondary | ICD-10-CM

## 2018-01-24 MED ORDER — SOFOSBUVIR-VELPATASVIR 400-100 MG PO TABS: 1.0000 | ORAL_TABLET | Freq: Every day | ORAL | 2 refills | Status: DC

## 2018-02-01 ENCOUNTER — Telehealth: Payer: Self-pay | Admitting: Pharmacist

## 2018-02-01 ENCOUNTER — Ambulatory Visit: Payer: Self-pay | Admitting: Pharmacy Technician

## 2018-02-01 NOTE — Telephone Encounter (Signed)
Patient is approved for Epclusa x 12 weeks through [employer]. Patient came to clinic today to pick up his first bottle.  Counseled him on how to take Epclusa and the importance of taking it every day. Counseled on possible side effects and to call me with any issues.  He will see me in ~4 weeks for follow-up.

## 2018-02-02 ENCOUNTER — Encounter: Payer: Self-pay | Admitting: Pharmacy Technician

## 2018-02-28 ENCOUNTER — Ambulatory Visit (INDEPENDENT_AMBULATORY_CARE_PROVIDER_SITE_OTHER): Payer: Self-pay | Admitting: Pharmacist

## 2018-02-28 DIAGNOSIS — B182 Chronic viral hepatitis C: Secondary | ICD-10-CM

## 2018-02-28 NOTE — Progress Notes (Signed)
HPI: [name] is a 62 y.o. male presenting [facility] for one month hepatitis C treatment follow up.   Medication: Epclusa x 12 weeks  Start Date: 02/01/18  Hepatitis C Genotype: 2  Fibrosis Score: F3/F4  Hepatitis C RNA: 116,000 (01/12/18)  Patient Active Problem List   Diagnosis Date Noted  . Chronic hepatitis C ([place]) 01/11/2018  . Alcohol-induced mood disorder ([place]) 10/31/2015  . Moderate major depression ([place]) 04/21/2015  . Major depressive disorder, single episode, moderate ([place]) 03/10/2015  . Alcohol dependence with uncomplicated withdrawal ([place]) 03/03/2015  . Alcohol abuse   . Suicidal ideation   . Suicidal ideation 11/20/2014  . Anxiousness 09/11/2013  . Substance induced mood disorder ([place]) 06/20/2013  . S/P alcohol detoxification 09/27/2012  . Alcohol dependence ([place]) 01/29/2012  . Type II or unspecified type diabetes mellitus without mention of complication, uncontrolled 11/11/2011  . Thrombocytopenia ([place])   . Hyperglycemia   . Alcoholic ([place])   . Chronic hyponatremia   . Alcohol dependence with acute alcoholic intoxication ([place]) 11/06/2011  . Diabetes mellitus ([place]) 11/06/2011  . Hepatitis C 11/06/2011    Patient's Medications  New Prescriptions   No medications on file  Previous Medications   GABAPENTIN (NEURONTIN) 400 MG CAPSULE    Take 4 capsules (1,600 mg total) by mouth 3 (three) times daily for 7 days.   INSULIN GLARGINE (LANTUS) 100 UNIT/ML INJECTION    Inject 0.3 mLs (30 Units total) into the skin at bedtime.   METFORMIN (GLUCOPHAGE) 1000 MG TABLET    Take 1 tablet (1,000 mg total) by mouth 2 (two) times daily with a meal.   SOFOSBUVIR-VELPATASVIR (EPCLUSA) 400-100 MG TABS    Take 1 tablet by mouth daily.  Modified Medications   No medications on file  Discontinued Medications   No medications on file    Allergies: Allergies  Allergen Reactions  . Benadryl [Diphenhydramine Hcl] Other (See Comments)    "Causes nervousness"  .  Benadryl [Diphenhydramine] Other (See Comments)    "makes my skin crawl"  . Sulfa Antibiotics Other (See Comments)    Childhood allergy  . Tegretol [Carbamazepine] Other (See Comments)    "almost killed me"  . Librium [Chlordiazepoxide Hcl] Anxiety  . Librium [Chlordiazepoxide] Anxiety  . Sulfa Antibiotics Rash    Past Medical History: Past Medical History:  Diagnosis Date  . Alcoholic ([place])   . Alcoholic cirrhosis of liver ([place])    Pt calls this a false diagnosis. Pt denies  . Alcoholism /alcohol abuse ([place])   . Anxiety   . Chronic hyponatremia    pt denies having   . Depression   . Diabetes mellitus   . Diabetes mellitus without complication ([place])   . Hepatitis C   . Hyperglycemia   . Neuropathy   . Substance abuse ([place])   . Thrombocytopenia ([place])     Social History: Social History   Socioeconomic History  . Marital status: Divorced    Spouse name: Not on file  . Number of children: Not on file  . Years of education: Not on file  . Highest education level: Not on file  Occupational History  . Not on file  Social Needs  . Financial resource strain: Not on file  . Food insecurity:    Worry: Not on file    Inability: Not on file  . Transportation needs:    Medical: Not on file    Non-medical: Not on file  Tobacco Use  . Smoking status:  Current Every Day Smoker    Packs/day: 1.00    Years: 40.00    Pack years: 40.00    Types: Cigarettes  . Smokeless tobacco: Never Used  Substance and Sexual Activity  . Alcohol use: Not Currently    Comment: 18/daily- reports has not drink in 2 years  . Drug use: Yes    Frequency: 7.0 times per week    Types: Marijuana    Comment: for sleep  . Sexual activity: Not Currently  Lifestyle  . Physical activity:    Days per week: Not on file    Minutes per session: Not on file  . Stress: Not on file  Relationships  . Social connections:    Talks on phone: Not on file    Gets together: Not on file    Attends religious  service: Not on file    Active member of club or organization: Not on file    Attends meetings of clubs or organizations: Not on file    Relationship status: Not on file  Other Topics Concern  . Not on file  Social History Narrative   ** Merged History Encounter **        Labs: Hepatitis C Lab Results  Component Value Date   HCVGENOTYPE 2 01/12/2018   HCVRNAPCRQN 116,000 (H) 01/12/2018   Hepatitis B Lab Results  Component Value Date   HEPBSAB NON-REACTIVE 01/12/2018   HEPBSAG NON-REACTIVE 01/12/2018   Hepatitis A No results found for: HAV HIV No results found for: HIV Lab Results  Component Value Date   CREATININE 0.81 01/12/2018   CREATININE 0.80 11/14/2015   CREATININE 0.51 (L) 11/03/2015   CREATININE 0.53 (L) 10/30/2015   CREATININE 0.50 (L) 08/18/2015   Lab Results  Component Value Date   AST 80 (H) 01/12/2018   AST 225 (H) 11/14/2015   AST 204 (H) 11/03/2015   ALT 46 01/12/2018   ALT 130 (H) 11/14/2015   ALT 163 (H) 11/03/2015    Assessment: Onyx started hepatitis C treatment with Epclusa x 12 weeks. Patient is negative for hep B. Patient states that he fell off a ladder last week but aside from that he is doing well. He followed up with his primary physician for the fall. Patient reports no missed doses. He does endorse a headache on the first day of therapy that has since resolved and some ongoing fatigue. Counseled patient that fatigue should decrease with time. I reviewed patient medications and found no interactions. Discussed with patient that there are several drug interactions including acid suppressants. Patient said he has used baking soda for heart burn yesterday. Counseled patient to separate this by at least 4 hours from his Epclusa. Instructed patient to call clinic if he starts a new drug during course of therapy. Also advised patient to call if his side effects worsen. Patient picked up second bottle in clinic today and will return for 3rd bottle  in a month. Follow up with Marya Amsler on 05/11/18 for EOT visit.   Plan: - continue Epclusa  - HCV RNA and CMET today - completed [facility] paperwork for labs  - follow up with Marya Amsler on 05/11/18 for EOT visit  Isaias Sakai, Pharm D PGY1 Pharmacy Resident  02/28/2018      4:39 PM

## 2018-02-28 NOTE — Patient Instructions (Signed)
It was great to see you today!   Continue taking Epclusa for a total of 12 weeks.  Call clinic if you take any additional medications so we can screen for interactions.

## 2018-03-03 LAB — COMPREHENSIVE METABOLIC PANEL
AG Ratio: 0.7 (calc) — ABNORMAL LOW (ref 1.0–2.5)
ALBUMIN MSPROF: 2.4 g/dL — AB (ref 3.6–5.1)
ALKALINE PHOSPHATASE (APISO): 180 U/L — AB (ref 40–115)
ALT: 11 U/L (ref 9–46)
AST: 21 U/L (ref 10–35)
BILIRUBIN TOTAL: 0.3 mg/dL (ref 0.2–1.2)
BUN/Creatinine Ratio: 26 (calc) — ABNORMAL HIGH (ref 6–22)
BUN: 15 mg/dL (ref 7–25)
CALCIUM: 8.4 mg/dL — AB (ref 8.6–10.3)
CO2: 31 mmol/L (ref 20–32)
CREATININE: 0.58 mg/dL — AB (ref 0.70–1.25)
Chloride: 96 mmol/L — ABNORMAL LOW (ref 98–110)
Globulin: 3.6 g/dL (calc) (ref 1.9–3.7)
Glucose, Bld: 365 mg/dL — ABNORMAL HIGH (ref 65–99)
Potassium: 5.2 mmol/L (ref 3.5–5.3)
SODIUM: 132 mmol/L — AB (ref 135–146)
TOTAL PROTEIN: 6 g/dL — AB (ref 6.1–8.1)

## 2018-03-03 LAB — HEPATITIS C RNA QUANTITATIVE
HCV Quantitative Log: 1.67 Log IU/mL — ABNORMAL HIGH
HCV RNA, PCR, QN: 47 IU/mL — ABNORMAL HIGH

## 2018-03-22 ENCOUNTER — Encounter (HOSPITAL_COMMUNITY): Payer: Self-pay | Admitting: Emergency Medicine

## 2018-03-22 ENCOUNTER — Ambulatory Visit (HOSPITAL_COMMUNITY)
Admission: EM | Admit: 2018-03-22 | Discharge: 2018-03-22 | Disposition: A | Discharge status: Home / Self Care | Payer: Self-pay | Attending: Family Medicine | Admitting: Family Medicine

## 2018-03-22 DIAGNOSIS — M25571 Pain in right ankle and joints of right foot: Secondary | ICD-10-CM

## 2018-03-22 DIAGNOSIS — R600 Localized edema: Secondary | ICD-10-CM

## 2018-03-22 DIAGNOSIS — R2243 Localized swelling, mass and lump, lower limb, bilateral: Secondary | ICD-10-CM

## 2018-03-22 DIAGNOSIS — M25572 Pain in left ankle and joints of left foot: Secondary | ICD-10-CM

## 2018-03-22 LAB — POCT I-STAT, CHEM 8
BUN: 13 mg/dL (ref 8–23)
CHLORIDE: 94 mmol/L — AB (ref 98–111)
CREATININE: 0.6 mg/dL — AB (ref 0.61–1.24)
Calcium, Ion: 1.11 mmol/L — ABNORMAL LOW (ref 1.15–1.40)
Glucose, Bld: 237 mg/dL — ABNORMAL HIGH (ref 70–99)
HEMATOCRIT: 29 % — AB (ref 39.0–52.0)
HEMOGLOBIN: 9.9 g/dL — AB (ref 13.0–17.0)
Potassium: 4.2 mmol/L (ref 3.5–5.1)
Sodium: 133 mmol/L — ABNORMAL LOW (ref 135–145)
TCO2: 30 mmol/L (ref 22–32)

## 2018-03-22 MED ORDER — MEDICAL COMPRESSION SOCKS MISC: 0 refills | Status: DC

## 2018-03-22 MED ORDER — FUROSEMIDE 40 MG PO TABS: 40.0000 mg | ORAL_TABLET | Freq: Every day | ORAL | 0 refills | Status: DC

## 2018-03-22 NOTE — Discharge Instructions (Signed)
Your kidney function was normal today, sodium slightly low, but at baseline.  Start Lasix as directed for the next 5 days.  Elevate your legs, compression stockings to help decrease fluid.  Follow-up with your PCP for further evaluation and management needed.  If experiencing chest pain, shortness of breath, needing more pillows to sleep at night, 1 sided leg swelling with redness/warmth, good tenderness department for further evaluation.

## 2018-03-22 NOTE — ED Triage Notes (Signed)
Pt c/o bilateral leg swelling for years but its gotten worse, pt states he wears diabetic socks sometimes and it still very swollen in his legs.

## 2018-03-22 NOTE — ED Provider Notes (Signed)
[facility]    CSN: [id number] Arrival date & time: 03/22/18  0932     History   Chief Complaint Chief Complaint  Patient presents with  . Leg Swelling    HPI [name] is a 62 y.o. male.   62 year old male with history of alcoholic cirrhosis, diabetes, neuropathy, thrombocytopenia, chronic hyponatremia, substance abuse comes in for 3-day history of bilateral leg swelling.  States has chronic leg edema, but has been significantly worse for the past 3 days.  Now with pain around the ankles.  Has chronic neuropathy, but has not noticed increased numbness/tingling.  Denies fever, chills, night sweats.  Denies spreading erythema warmth.  Denies chest pain, shortness of breath, palpitations.  Denies weakness, dizziness, syncope.  States has been out of work due to injury the past month.  Return to work a week ago with long hours of standing and walking.  States swelling is slightly decreased after nighttime.  But worsens throughout the day.  No recent travel, denies history of blood clots.  Denies alcohol use.  States last A1c "close to normal".      Past Medical History:  Diagnosis Date  . Alcoholic ([place])   . Alcoholic cirrhosis of liver ([place])    Pt calls this a false diagnosis. Pt denies  . Alcoholism /alcohol abuse ([place])   . Anxiety   . Chronic hyponatremia    pt denies having   . Depression   . Diabetes mellitus   . Diabetes mellitus without complication ([place])   . Hepatitis C   . Hyperglycemia   . Neuropathy   . Substance abuse ([place])   . Thrombocytopenia [facility])     Patient Active Problem List   Diagnosis Date Noted  . Chronic hepatitis C ([place]) 01/11/2018  . Alcohol-induced mood disorder ([place]) 10/31/2015  . Moderate major depression ([place]) 04/21/2015  . Major depressive disorder, single episode, moderate ([place]) 03/10/2015  . Alcohol dependence with uncomplicated withdrawal ([place]) 03/03/2015  . Alcohol abuse   . Suicidal ideation   . Suicidal ideation  11/20/2014  . Anxiousness 09/11/2013  . Substance induced mood disorder ([place]) 06/20/2013  . S/P alcohol detoxification 09/27/2012  . Alcohol dependence ([place]) 01/29/2012  . Type II or unspecified type diabetes mellitus without mention of complication, uncontrolled 11/11/2011  . Thrombocytopenia ([place])   . Hyperglycemia   . Alcoholic ([place])   . Chronic hyponatremia   . Alcohol dependence with acute alcoholic intoxication ([place]) 11/06/2011  . Diabetes mellitus ([place]) 11/06/2011  . Hepatitis C 11/06/2011    Past Surgical History:  Procedure Laterality Date  . FRACTURE SURGERY  right lower leg plate placed years ago  . Plate in lower right leg  2008       Home Medications    Prior to Admission medications   Medication Sig Start Date End Date Taking? Authorizing Provider  Cyclobenzaprine HCl (FLEXERIL PO) Take by mouth.    [provider]  Elastic Bandages & Supports (MEDICAL COMPRESSION SOCKS) MISC Compression stocking daily 03/22/18   Tasia Catchings, Amy V, PA-C  furosemide (LASIX) 40 MG tablet Take 1 tablet (40 mg total) by mouth daily. 03/22/18   Tasia Catchings, Amy V, PA-C  gabapentin (NEURONTIN) 400 MG capsule Take 4 capsules (1,600 mg total) by mouth 3 (three) times daily for 7 days. 01/15/18 01/22/18  Wieters, Hallie C, PA-C  insulin glargine (LANTUS) 100 UNIT/ML injection Inject 0.3 mLs (30 Units total) into the skin at bedtime. Patient taking differently: Inject 45 Units into the skin  at bedtime.  11/01/15   Patrecia Pour, NP  metFORMIN (GLUCOPHAGE) 1000 MG tablet Take 1 tablet (1,000 mg total) by mouth 2 (two) times daily with a meal. 11/01/15   Lord, Asa Saunas, NP  Sofosbuvir-Velpatasvir (EPCLUSA) 400-100 MG TABS Take 1 tablet by mouth daily. 01/24/18   [place], FNP    Family History Family History  Problem Relation Age of Onset  . ALS Father     Social History Social History   Tobacco Use  . Smoking status: Current Every Day Smoker    Packs/day: 1.00    Years: 40.00     Pack years: 40.00    Types: Cigarettes  . Smokeless tobacco: Never Used  Substance Use Topics  . Alcohol use: Not Currently    Comment: 18/daily- reports has not drink in 2 years  . Drug use: Yes    Frequency: 7.0 times per week    Types: Marijuana    Comment: for sleep     Allergies   Benadryl [diphenhydramine hcl]; Benadryl [diphenhydramine]; Sulfa antibiotics; Tegretol [carbamazepine]; Librium [chlordiazepoxide hcl]; Librium [chlordiazepoxide]; and Sulfa antibiotics   Review of Systems Review of Systems  Reason unable to perform ROS: See HPI as above.     Physical Exam Triage Vital Signs ED Triage Vitals  Enc Vitals Group     BP 03/22/18 1018 (!) 178/79     Pulse Rate 03/22/18 1018 100     Resp 03/22/18 1018 16     Temp 03/22/18 1018 97.6 F (36.4 C)     Temp src --      SpO2 03/22/18 1018 100 %     Weight --      Height --      Head Circumference --      Peak Flow --      Pain Score 03/22/18 1021 0     Pain Loc --      Pain Edu? --      Excl. in [place]? --    No data found.  Updated Vital Signs BP (!) 178/79   Pulse 100   Temp 97.6 F (36.4 C)   Resp 16   SpO2 100%   Physical Exam  Constitutional: He is oriented to person, place, and time. He appears well-developed and well-nourished. No distress.  HENT:  Head: Normocephalic and atraumatic.  Eyes: Pupils are equal, round, and reactive to light. Conjunctivae are normal.  Musculoskeletal:  Bilateral leg swelling without erythema, warmth, contusion. 3 pitting edema to the ankle bilaterally.  1-2+ pitting edema of the lower extremity, extending up to the knee.  Tenderness to deep palpation.  No calf pain.  Full range of motion of knee and ankles.  Strength normal and equal bilaterally.  Sensation at baseline per patient given neuropathy. Pedal pulse hard to obtain through edema cap refill <2s.  Negative Homans.  Right leg: 29.5 cm (distal 1/3) 36 cm (mid calf) 39.4 cm (prox 1/3) Left leg: 29 cm, 35.5cm,  40.3cm  Neurological: He is alert and oriented to person, place, and time.  Skin: He is not diaphoretic.     UC Treatments / Results  Labs (all labs ordered are listed, but only abnormal results are displayed) Labs Reviewed  POCT I-STAT, CHEM 8 - Abnormal; Notable for the following components:      Result Value   Sodium 133 (*)    Chloride 94 (*)    Creatinine, Ser 0.60 (*)    Glucose, Bld 237 (*)  Calcium, Ion 1.11 (*)    Hemoglobin 9.9 (*)    HCT 29.0 (*)    All other components within normal limits    EKG None  Radiology No results found.  Procedures Procedures (including critical care time)  Medications Ordered in UC Medications - No data to display  Initial Impression / Assessment and Plan / UC Course  I have reviewed the triage vital signs and the nursing notes.  Pertinent labs & imaging results that were available during my care of the patient were reviewed by me and considered in my medical decision making (see chart for details).    I-STAT with normal creatinine, sodium 133, at baseline.  Bilateral leg edema without erythema or warmth.  Low suspicion for DVT, cellulitis.  Will provide 5 days of Lasix to help decrease swelling.  Leg elevation, compression stockings as directed.  Return precautions given.  Specifically discussed symptoms of worsening hyponatremia.  Patient expresses understanding and agrees to plan.  Case discussed with Dr Mannie Stabile, who agrees to plan.  Final Clinical Impressions(s) / UC Diagnoses   Final diagnoses:  Bilateral lower extremity edema    ED Prescriptions    Medication Sig Dispense Auth. Provider   furosemide (LASIX) 40 MG tablet Take 1 tablet (40 mg total) by mouth daily. 5 tablet Ok Edwards, PA-C   Elastic Bandages & Supports (MEDICAL COMPRESSION SOCKS) MISC Compression stocking daily 2 each Tobin Chad, PA-C 03/22/18 1410

## 2018-05-11 ENCOUNTER — Ambulatory Visit: Payer: Self-pay | Admitting: Family

## 2018-05-11 NOTE — Progress Notes (Deleted)
Subjective:    Patient ID: [name], male    DOB: 1955/08/27, 63 y.o.   MRN: [id number]  No chief complaint on file.    HPI:  [name] is a 63 y.o. male who presents today for follow up of Hepatitis C treatment.   [name] was last seen on 10/22 for follow up with our pharmacy staff following starting 12 weeks of Epclusa for Genotype 2 Hepatitis C with initial viral load of 116,000 and Fibrosis score of F3/F4. Lab work showed a viral load improved from 116,000 down to 47. With normal liver function tests.  Mr. [name] has    Allergies  Allergen Reactions  . Benadryl [Diphenhydramine Hcl] Other (See Comments)    "Causes nervousness"  . Benadryl [Diphenhydramine] Other (See Comments)    "makes my skin crawl"  . Sulfa Antibiotics Other (See Comments)    Childhood allergy  . Tegretol [Carbamazepine] Other (See Comments)    "almost killed me"  . Librium [Chlordiazepoxide Hcl] Anxiety  . Librium [Chlordiazepoxide] Anxiety  . Sulfa Antibiotics Rash      Outpatient Medications Prior to Visit  Medication Sig Dispense Refill  . Cyclobenzaprine HCl (FLEXERIL PO) Take by mouth.    Water engineer Bandages & Supports (MEDICAL COMPRESSION SOCKS) MISC Compression stocking daily 2 each 0  . furosemide (LASIX) 40 MG tablet Take 1 tablet (40 mg total) by mouth daily. 5 tablet 0  . gabapentin (NEURONTIN) 400 MG capsule Take 4 capsules (1,600 mg total) by mouth 3 (three) times daily for 7 days. 84 capsule 0  . insulin glargine (LANTUS) 100 UNIT/ML injection Inject 0.3 mLs (30 Units total) into the skin at bedtime. (Patient taking differently: Inject 45 Units into the skin at bedtime. ) 10 mL 11  . metFORMIN (GLUCOPHAGE) 1000 MG tablet Take 1 tablet (1,000 mg total) by mouth 2 (two) times daily with a meal. 6 tablet 0  . Sofosbuvir-Velpatasvir (EPCLUSA) 400-100 MG TABS Take 1 tablet by mouth daily. 28 tablet 2   No facility-administered medications prior to visit.      Past  Medical History:  Diagnosis Date  . Alcoholic ([place])   . Alcoholic cirrhosis of liver ([place])    Pt calls this a false diagnosis. Pt denies  . Alcoholism /alcohol abuse ([place])   . Anxiety   . Chronic hyponatremia    pt denies having   . Depression   . Diabetes mellitus   . Diabetes mellitus without complication ([place])   . Hepatitis C   . Hyperglycemia   . Neuropathy   . Substance abuse ([place])   . Thrombocytopenia ([place])      Past Surgical History:  Procedure Laterality Date  . FRACTURE SURGERY  right lower leg plate placed years ago  . Plate in lower right leg  2008       Review of Systems  Constitutional: Negative for chills, diaphoresis, fatigue and fever.  Respiratory: Negative for cough, chest tightness, shortness of breath and wheezing.   Cardiovascular: Negative for chest pain.  Gastrointestinal: Negative for abdominal distention, abdominal pain, constipation, diarrhea, nausea and vomiting.  Neurological: Negative for weakness and headaches.  Hematological: Does not bruise/bleed easily.      Objective:    There were no vitals taken for this visit. Nursing note and vital signs reviewed.  Physical Exam Constitutional:      General: He is not in acute distress.    Appearance: He is well-developed.  Cardiovascular:     Rate  and Rhythm: Normal rate and regular rhythm.     Heart sounds: Normal heart sounds. No murmur. No friction rub. No gallop.   Pulmonary:     Effort: Pulmonary effort is normal. No respiratory distress.     Breath sounds: Normal breath sounds. No wheezing or rales.  Chest:     Chest wall: No tenderness.  Abdominal:     General: Bowel sounds are normal. There is no distension.     Palpations: Abdomen is soft. There is no mass.     Tenderness: There is no abdominal tenderness. There is no guarding or rebound.  Skin:    General: Skin is warm and dry.  Neurological:     Mental Status: He is alert and oriented to person, place, and time.    Psychiatric:        Behavior: Behavior normal.        Thought Content: Thought content normal.        Judgment: Judgment normal.        Assessment & Plan:   Problem List Items Addressed This Visit    None       I am having [name]. Witczak maintain his metFORMIN, insulin glargine, gabapentin, Sofosbuvir-Velpatasvir, Cyclobenzaprine HCl (FLEXERIL PO), furosemide, and Medical Compression Socks.   No orders of the defined types were placed in this encounter.    Follow-up: No follow-ups on file.   Terri Piedra, MSN, FNP-C Nurse Practitioner [facility] Office phone: [phone] Pager: [place] number: [phone]

## 2018-05-12 ENCOUNTER — Encounter (HOSPITAL_COMMUNITY): Payer: Self-pay | Admitting: Emergency Medicine

## 2018-05-12 ENCOUNTER — Ambulatory Visit (INDEPENDENT_AMBULATORY_CARE_PROVIDER_SITE_OTHER): Payer: Self-pay

## 2018-05-12 ENCOUNTER — Ambulatory Visit (HOSPITAL_COMMUNITY)
Admission: EM | Admit: 2018-05-12 | Discharge: 2018-05-12 | Disposition: A | Discharge status: Home / Self Care | Payer: Self-pay | Attending: Family Medicine | Admitting: Family Medicine

## 2018-05-12 ENCOUNTER — Other Ambulatory Visit: Payer: Self-pay

## 2018-05-12 DIAGNOSIS — R05 Cough: Secondary | ICD-10-CM

## 2018-05-12 DIAGNOSIS — R062 Wheezing: Secondary | ICD-10-CM

## 2018-05-12 DIAGNOSIS — Z720 Tobacco use: Secondary | ICD-10-CM

## 2018-05-12 DIAGNOSIS — R059 Cough, unspecified: Secondary | ICD-10-CM

## 2018-05-12 DIAGNOSIS — F17200 Nicotine dependence, unspecified, uncomplicated: Secondary | ICD-10-CM | POA: Insufficient documentation

## 2018-05-12 MED ORDER — PREDNISONE 10 MG (21) PO TBPK: ORAL_TABLET | Freq: Every day | ORAL | 0 refills | Status: DC

## 2018-05-12 NOTE — Discharge Instructions (Addendum)
Be aware: while you are taking prednisone, your blood sugars may be higher than normal.

## 2018-05-12 NOTE — ED Provider Notes (Signed)
[place] [facility]   [id number] 05/12/18 Arrival Time: 1059  ASSESSMENT & PLAN:  1. Cough   2. Wheezing   3. Smoker    I have personally viewed the imaging studies ordered this visit. No signs of pneumonia. Discussed.  See AVS for d/c instructions.  Will hold off on further antibiotics as this time. Trial of: Meds ordered this encounter  Medications  . predniSONE (STERAPRED UNI-PAK 21 TAB) 10 MG (21) TBPK tablet    Sig: Take by mouth daily. Take as directed.    Dispense:  21 tablet    Refill:  0   OTC symptom care as needed. Ensure adequate fluid intake and rest. May f/u with PCP or here as needed.  Reviewed expectations re: course of current medical issues. Questions answered. Outlined signs and symptoms indicating need for more acute intervention. Patient verbalized understanding. After Visit Summary given.   SUBJECTIVE: History from: patient.  [name] is a 63 y.o. male who presents with complaint of persistent coughing for the past 2 months. Has been treated with antibiotics without reported help. Cough occasionally productive of "yellow mucous"; no blood. Mild nasal congestion. Current smoker; 1 PPD. Overall without significant fatigue and without body aches. SOB: none. Wheezing: mild to moderate when present; notices especially when he is coughing. Albuterol inhaler with temporary help. Fever: no. Overall normal PO intake without n/v. Known sick contacts: no. No specific or significant aggravating or alleviating factors reported. OTC treatment: none.  Received flu shot this year: yes.  Social History   Tobacco Use  Smoking Status Current Every Day Smoker  . Packs/day: 1.00  . Years: 40.00  . Pack years: 40.00  . Types: Cigarettes  Smokeless Tobacco Never Used   ROS: As per HPI. All other systems negative.   OBJECTIVE:  Vitals:   05/12/18 1242  BP: (!) 179/79  Pulse: (!) 110  Temp: 99.2 F (37.3 C)  TempSrc: Oral  SpO2: 98%      Tachycardia noted.  General appearance: alert; no distress HEENT: nasal congestion; clear runny nose; throat irritation secondary to post-nasal drainage Neck: supple without LAD CV: regular rhythm Lungs: unlabored respirations, symmetrical air entry with bilateral expiratory wheezing; cough: moderate; able to speak in full sentences Abd: soft; non-tender Ext: no LE edema Skin: warm and dry Psychological: alert and cooperative; normal mood and affect  Imaging: Dg Chest 2 View  Result Date: 05/12/2018 CLINICAL DATA:  Per pt: woke up two night in a row choking from mucus in throat, low grade fever, head and chest congestion. History of acute Asthma, continuous Bronchitis. No history of cardiac disease. HBP controlled with medication. Patient is a diabetic. Smoker of a ppd of cigarettes. EXAM: CHEST - 2 VIEW COMPARISON:  11/06/2014 FINDINGS: Cardiac silhouette is normal in size and configuration. No mediastinal or hilar masses. There is no evidence of adenopathy. There are prominent bronchovascular markings bilaterally. Lungs otherwise clear. No pleural effusion or pneumothorax. Skeletal structures are intact. IMPRESSION: No active cardiopulmonary disease. Electronically Signed   By: Lajean Manes M.D.   On: 05/12/2018 13:36    Allergies  Allergen Reactions  . Benadryl [Diphenhydramine Hcl] Other (See Comments)    "Causes nervousness"  . Benadryl [Diphenhydramine] Other (See Comments)    "makes my skin crawl"  . Sulfa Antibiotics Other (See Comments)    Childhood allergy  . Tegretol [Carbamazepine] Other (See Comments)    "almost killed me"  . Librium [Chlordiazepoxide Hcl] Anxiety  . Librium [Chlordiazepoxide] Anxiety  .  Sulfa Antibiotics Rash    Past Medical History:  Diagnosis Date  . Alcoholic ([place])   . Alcoholic cirrhosis of liver ([place])    Pt calls this a false diagnosis. Pt denies  . Alcoholism /alcohol abuse ([place])   . Anxiety   . Chronic hyponatremia    pt denies having    . Depression   . Diabetes mellitus   . Diabetes mellitus without complication ([place])   . Hepatitis C   . Hyperglycemia   . Neuropathy   . Substance abuse ([place])   . Thrombocytopenia ([place])    Family History  Problem Relation Age of Onset  . ALS Father    Social History   Socioeconomic History  . Marital status: Divorced    Spouse name: Not on file  . Number of children: Not on file  . Years of education: Not on file  . Highest education level: Not on file  Occupational History  . Not on file  Social Needs  . Financial resource strain: Not on file  . Food insecurity:    Worry: Not on file    Inability: Not on file  . Transportation needs:    Medical: Not on file    Non-medical: Not on file  Tobacco Use  . Smoking status: Current Every Day Smoker    Packs/day: 1.00    Years: 40.00    Pack years: 40.00    Types: Cigarettes  . Smokeless tobacco: Never Used  Substance and Sexual Activity  . Alcohol use: Not Currently    Comment: 18/daily- reports has not drink in 2 years  . Drug use: Yes    Frequency: 7.0 times per week    Types: Marijuana    Comment: for sleep  . Sexual activity: Not Currently  Lifestyle  . Physical activity:    Days per week: Not on file    Minutes per session: Not on file  . Stress: Not on file  Relationships  . Social connections:    Talks on phone: Not on file    Gets together: Not on file    Attends religious service: Not on file    Active member of club or organization: Not on file    Attends meetings of clubs or organizations: Not on file    Relationship status: Not on file  . Intimate partner violence:    Fear of current or ex partner: Not on file    Emotionally abused: Not on file    Physically abused: Not on file    Forced sexual activity: Not on file  Other Topics Concern  . Not on file  Social History Narrative   ** Merged History Encounter Vanessa Kick, MD 05/12/18 1410

## 2018-05-12 NOTE — ED Triage Notes (Signed)
Pt reports a productive cough x2 months that wakes him up at night with phlegm build up in his throat that chokes him.  Pt states he was prescribed antibiotics within the last two months but the symptoms came back.

## 2018-05-12 NOTE — ED Notes (Signed)
Bed: UC04 Expected date:  Expected time:  Means of arrival:  Comments: Clovis Riley, in triage

## 2018-05-31 ENCOUNTER — Inpatient Hospital Stay (HOSPITAL_COMMUNITY)
Admission: EM | Admit: 2018-05-31 | Discharge: 2018-06-02 | DRG: 436 | Disposition: A | Discharge status: Home / Self Care | Payer: Medicaid Other | Attending: Student | Admitting: Student

## 2018-05-31 ENCOUNTER — Observation Stay (HOSPITAL_COMMUNITY): Payer: Medicaid Other

## 2018-05-31 ENCOUNTER — Encounter (HOSPITAL_COMMUNITY): Payer: Self-pay | Admitting: Radiology

## 2018-05-31 ENCOUNTER — Emergency Department (HOSPITAL_COMMUNITY): Payer: Medicaid Other

## 2018-05-31 ENCOUNTER — Other Ambulatory Visit: Payer: Self-pay

## 2018-05-31 DIAGNOSIS — K7460 Unspecified cirrhosis of liver: Secondary | ICD-10-CM

## 2018-05-31 DIAGNOSIS — K5900 Constipation, unspecified: Secondary | ICD-10-CM | POA: Diagnosis present

## 2018-05-31 DIAGNOSIS — R601 Generalized edema: Secondary | ICD-10-CM

## 2018-05-31 DIAGNOSIS — R160 Hepatomegaly, not elsewhere classified: Secondary | ICD-10-CM | POA: Diagnosis not present

## 2018-05-31 DIAGNOSIS — Z720 Tobacco use: Secondary | ICD-10-CM

## 2018-05-31 DIAGNOSIS — E162 Hypoglycemia, unspecified: Secondary | ICD-10-CM

## 2018-05-31 DIAGNOSIS — K7689 Other specified diseases of liver: Secondary | ICD-10-CM

## 2018-05-31 DIAGNOSIS — Z820 Family history of epilepsy and other diseases of the nervous system: Secondary | ICD-10-CM

## 2018-05-31 DIAGNOSIS — Z8619 Personal history of other infectious and parasitic diseases: Secondary | ICD-10-CM

## 2018-05-31 DIAGNOSIS — I700 Atherosclerosis of aorta: Secondary | ICD-10-CM | POA: Diagnosis present

## 2018-05-31 DIAGNOSIS — D649 Anemia, unspecified: Secondary | ICD-10-CM | POA: Diagnosis present

## 2018-05-31 DIAGNOSIS — E1165 Type 2 diabetes mellitus with hyperglycemia: Secondary | ICD-10-CM | POA: Diagnosis not present

## 2018-05-31 DIAGNOSIS — G629 Polyneuropathy, unspecified: Secondary | ICD-10-CM

## 2018-05-31 DIAGNOSIS — E871 Hypo-osmolality and hyponatremia: Secondary | ICD-10-CM | POA: Diagnosis not present

## 2018-05-31 DIAGNOSIS — K828 Other specified diseases of gallbladder: Secondary | ICD-10-CM | POA: Diagnosis present

## 2018-05-31 DIAGNOSIS — IMO0001 Reserved for inherently not codable concepts without codable children: Secondary | ICD-10-CM | POA: Diagnosis present

## 2018-05-31 DIAGNOSIS — E1169 Type 2 diabetes mellitus with other specified complication: Secondary | ICD-10-CM

## 2018-05-31 DIAGNOSIS — K7031 Alcoholic cirrhosis of liver with ascites: Secondary | ICD-10-CM | POA: Diagnosis present

## 2018-05-31 DIAGNOSIS — Z79899 Other long term (current) drug therapy: Secondary | ICD-10-CM

## 2018-05-31 DIAGNOSIS — F17210 Nicotine dependence, cigarettes, uncomplicated: Secondary | ICD-10-CM | POA: Diagnosis present

## 2018-05-31 DIAGNOSIS — D638 Anemia in other chronic diseases classified elsewhere: Secondary | ICD-10-CM | POA: Diagnosis present

## 2018-05-31 DIAGNOSIS — F1021 Alcohol dependence, in remission: Secondary | ICD-10-CM | POA: Diagnosis present

## 2018-05-31 DIAGNOSIS — I8500 Esophageal varices without bleeding: Secondary | ICD-10-CM | POA: Diagnosis present

## 2018-05-31 DIAGNOSIS — R911 Solitary pulmonary nodule: Secondary | ICD-10-CM

## 2018-05-31 DIAGNOSIS — D508 Other iron deficiency anemias: Secondary | ICD-10-CM | POA: Diagnosis not present

## 2018-05-31 DIAGNOSIS — Z882 Allergy status to sulfonamides status: Secondary | ICD-10-CM

## 2018-05-31 DIAGNOSIS — R188 Other ascites: Secondary | ICD-10-CM

## 2018-05-31 DIAGNOSIS — C220 Liver cell carcinoma: Principal | ICD-10-CM | POA: Diagnosis present

## 2018-05-31 DIAGNOSIS — R9431 Abnormal electrocardiogram [ECG] [EKG]: Secondary | ICD-10-CM | POA: Diagnosis present

## 2018-05-31 DIAGNOSIS — C772 Secondary and unspecified malignant neoplasm of intra-abdominal lymph nodes: Secondary | ICD-10-CM | POA: Diagnosis present

## 2018-05-31 DIAGNOSIS — D72828 Other elevated white blood cell count: Secondary | ICD-10-CM | POA: Diagnosis not present

## 2018-05-31 DIAGNOSIS — E119 Type 2 diabetes mellitus without complications: Secondary | ICD-10-CM

## 2018-05-31 DIAGNOSIS — Z794 Long term (current) use of insulin: Secondary | ICD-10-CM

## 2018-05-31 DIAGNOSIS — I872 Venous insufficiency (chronic) (peripheral): Secondary | ICD-10-CM | POA: Diagnosis present

## 2018-05-31 DIAGNOSIS — N400 Enlarged prostate without lower urinary tract symptoms: Secondary | ICD-10-CM | POA: Diagnosis present

## 2018-05-31 DIAGNOSIS — K8020 Calculus of gallbladder without cholecystitis without obstruction: Secondary | ICD-10-CM | POA: Diagnosis present

## 2018-05-31 DIAGNOSIS — E1142 Type 2 diabetes mellitus with diabetic polyneuropathy: Secondary | ICD-10-CM | POA: Diagnosis present

## 2018-05-31 DIAGNOSIS — E11649 Type 2 diabetes mellitus with hypoglycemia without coma: Secondary | ICD-10-CM | POA: Diagnosis present

## 2018-05-31 DIAGNOSIS — Z888 Allergy status to other drugs, medicaments and biological substances status: Secondary | ICD-10-CM

## 2018-05-31 DIAGNOSIS — D72829 Elevated white blood cell count, unspecified: Secondary | ICD-10-CM | POA: Diagnosis present

## 2018-05-31 HISTORY — DX: Tobacco use: Z72.0

## 2018-05-31 LAB — CBC WITH DIFFERENTIAL/PLATELET
ABS IMMATURE GRANULOCYTES: 0.15 10*3/uL — AB (ref 0.00–0.07)
BASOS ABS: 0.1 10*3/uL (ref 0.0–0.1)
BASOS PCT: 0 %
Eosinophils Absolute: 0 10*3/uL (ref 0.0–0.5)
Eosinophils Relative: 0 %
HCT: 31.9 % — ABNORMAL LOW (ref 39.0–52.0)
HEMOGLOBIN: 10.5 g/dL — AB (ref 13.0–17.0)
Immature Granulocytes: 1 %
Lymphocytes Relative: 10 %
Lymphs Abs: 1.6 10*3/uL (ref 0.7–4.0)
MCH: 31.1 pg (ref 26.0–34.0)
MCHC: 32.9 g/dL (ref 30.0–36.0)
MCV: 94.4 fL (ref 80.0–100.0)
MONO ABS: 1.6 10*3/uL — AB (ref 0.1–1.0)
Monocytes Relative: 9 %
NEUTROS ABS: 13.7 10*3/uL — AB (ref 1.7–7.7)
NRBC: 0 % (ref 0.0–0.2)
Neutrophils Relative %: 80 %
PLATELETS: 399 10*3/uL (ref 150–400)
RBC: 3.38 MIL/uL — AB (ref 4.22–5.81)
RDW: 14.6 % (ref 11.5–15.5)
WBC: 17.2 10*3/uL — AB (ref 4.0–10.5)

## 2018-05-31 LAB — URINALYSIS, ROUTINE W REFLEX MICROSCOPIC
Bilirubin Urine: NEGATIVE
GLUCOSE, UA: NEGATIVE mg/dL
Ketones, ur: NEGATIVE mg/dL
LEUKOCYTES UA: NEGATIVE
NITRITE: NEGATIVE
Protein, ur: >=300 mg/dL — AB
SPECIFIC GRAVITY, URINE: 1.015 (ref 1.005–1.030)
pH: 6 (ref 5.0–8.0)

## 2018-05-31 LAB — I-STAT TROPONIN, ED: TROPONIN I, POC: 0.01 ng/mL (ref 0.00–0.08)

## 2018-05-31 LAB — COMPREHENSIVE METABOLIC PANEL
ALBUMIN: 1.7 g/dL — AB (ref 3.5–5.0)
ALK PHOS: 127 U/L — AB (ref 38–126)
ALT: 22 U/L (ref 0–44)
AST: 43 U/L — ABNORMAL HIGH (ref 15–41)
Anion gap: 7 (ref 5–15)
BUN: 13 mg/dL (ref 8–23)
CALCIUM: 8.2 mg/dL — AB (ref 8.9–10.3)
CHLORIDE: 94 mmol/L — AB (ref 98–111)
CO2: 30 mmol/L (ref 22–32)
Creatinine, Ser: 0.64 mg/dL (ref 0.61–1.24)
GFR calc Af Amer: >60 mL/min (ref >60)
GFR calc non Af Amer: >60 mL/min (ref >60)
GLUCOSE: 60 mg/dL — AB (ref 70–99)
Potassium: 3.8 mmol/L (ref 3.5–5.1)
SODIUM: 131 mmol/L — AB (ref 135–145)
Total Bilirubin: 0.8 mg/dL (ref 0.3–1.2)
Total Protein: 6.8 g/dL (ref 6.5–8.1)

## 2018-05-31 LAB — MAGNESIUM: Magnesium: 1.6 mg/dL — ABNORMAL LOW (ref 1.7–2.4)

## 2018-05-31 LAB — CBG MONITORING, ED: GLUCOSE-CAPILLARY: 127 mg/dL — AB (ref 70–99)

## 2018-05-31 LAB — LIPASE, BLOOD: Lipase: 28 U/L (ref 11–51)

## 2018-05-31 LAB — PROTIME-INR
INR: 1.09
Prothrombin Time: 14 seconds (ref 11.4–15.2)

## 2018-05-31 LAB — GLUCOSE, CAPILLARY: Glucose-Capillary: 148 mg/dL — ABNORMAL HIGH (ref 70–99)

## 2018-05-31 LAB — PHOSPHORUS: Phosphorus: 3 mg/dL (ref 2.5–4.6)

## 2018-05-31 MED ORDER — IOPAMIDOL (ISOVUE-300) INJECTION 61%
INTRAVENOUS | Status: AC
  Filled 2018-05-31: qty 100

## 2018-05-31 MED ORDER — INSULIN GLARGINE 100 UNIT/ML ~~LOC~~ SOLN
35.0000 [IU] | Freq: Every day | SUBCUTANEOUS | Status: DC
  Administered 2018-05-31 – 2018-06-01 (×2): 35 [IU] via SUBCUTANEOUS
  Filled 2018-05-31 (×2): qty 0.35

## 2018-05-31 MED ORDER — ONDANSETRON HCL 4 MG PO TABS: 4.0000 mg | ORAL_TABLET | Freq: Four times a day (QID) | ORAL | Status: DC | PRN

## 2018-05-31 MED ORDER — MAGNESIUM SULFATE 2 GM/50ML IV SOLN
2.0000 g | Freq: Once | INTRAVENOUS | Status: AC
  Administered 2018-05-31: 2 g via INTRAVENOUS
  Filled 2018-05-31: qty 50

## 2018-05-31 MED ORDER — ONDANSETRON HCL 4 MG/2ML IJ SOLN: 4.0000 mg | Freq: Four times a day (QID) | INTRAMUSCULAR | Status: DC | PRN

## 2018-05-31 MED ORDER — DEXTROSE 50 % IV SOLN
1.0000 | Freq: Once | INTRAVENOUS | Status: AC
  Administered 2018-05-31: 50 mL via INTRAVENOUS
  Filled 2018-05-31: qty 50

## 2018-05-31 MED ORDER — INSULIN ASPART 100 UNIT/ML ~~LOC~~ SOLN: 0.0000 [IU] | Freq: Every day | SUBCUTANEOUS | Status: DC

## 2018-05-31 MED ORDER — GADOBUTROL 1 MMOL/ML IV SOLN
9.0000 mL | Freq: Once | INTRAVENOUS | Status: AC | PRN
  Administered 2018-05-31: 9 mL via INTRAVENOUS

## 2018-05-31 MED ORDER — LABETALOL HCL 5 MG/ML IV SOLN
10.0000 mg | INTRAVENOUS | Status: DC | PRN
  Administered 2018-05-31: 10 mg via INTRAVENOUS
  Filled 2018-05-31 (×2): qty 4

## 2018-05-31 MED ORDER — SODIUM CHLORIDE (PF) 0.9 % IJ SOLN
INTRAMUSCULAR | Status: AC
  Filled 2018-05-31: qty 50

## 2018-05-31 MED ORDER — GABAPENTIN 400 MG PO CAPS
1600.0000 mg | ORAL_CAPSULE | Freq: Three times a day (TID) | ORAL | Status: DC
  Administered 2018-05-31 – 2018-06-02 (×6): 1600 mg via ORAL
  Filled 2018-05-31 (×6): qty 4

## 2018-05-31 MED ORDER — INSULIN ASPART 100 UNIT/ML ~~LOC~~ SOLN
0.0000 [IU] | Freq: Three times a day (TID) | SUBCUTANEOUS | Status: DC
  Administered 2018-06-01 – 2018-06-02 (×2): 2 [IU] via SUBCUTANEOUS
  Administered 2018-06-02: 3 [IU] via SUBCUTANEOUS

## 2018-05-31 MED ORDER — IOPAMIDOL (ISOVUE-300) INJECTION 61%
100.0000 mL | Freq: Once | INTRAVENOUS | Status: AC | PRN
  Administered 2018-05-31: 100 mL via INTRAVENOUS

## 2018-05-31 NOTE — ED Provider Notes (Signed)
[facility] DEPT Provider Note   CSN: [id number] Arrival date & time: 05/31/18  0803     History   Chief Complaint Chief Complaint  Patient presents with  . Abdominal Pain    HPI [name] is a 63 y.o. male.  HPI  Patient is a 63 year old male with a history of alcohol use, in remission, hepatitis C, treated with Epclusa, diabetes on metformin, presenting for generalized fatigue and left lower quadrant pain.  Patient reports that he had some sharp left lower quadrant tenderness around 2-3 AM.  He reports that he went to the drugstore and obtain Tums which took the sharpness away.  Patient reports that he subsequently had nausea, sweats, and "dizziness".  He denies any chest pain, shortness of breath, syncope or presyncope.  Patient reports that over the past couple weeks he has had difficulty urinating, but denies dysuria, urgency, or frequency.  Patient reports that he has been constipated last bowel movement a couple days ago, but no diarrhea.  Patient denies melena or hematochezia.  Patient denies fever or chills.  Patient also notes that his abdomen has been "swollen" for the past couple months.  Denies history of this.  Denies receiving a diagnosis of cirrhosis.  He also notes that bilateral lower extremities have been swollen ever since he took prednisone a couple weeks ago.  No history of nephrolithiasis. No abdominal surgical history.   Past Medical History:  Diagnosis Date  . Alcoholic ([place])   . Alcoholic cirrhosis of liver ([place])    Pt calls this a false diagnosis. Pt denies  . Alcoholism /alcohol abuse ([place])   . Anxiety   . Chronic hyponatremia    pt denies having   . Depression   . Diabetes mellitus   . Diabetes mellitus without complication ([place])   . Hepatitis C   . Hyperglycemia   . Neuropathy   . Substance abuse ([place])   . Thrombocytopenia [facility])     Patient Active Problem List   Diagnosis Date Noted  . Chronic hepatitis C  ([place]) 01/11/2018  . Alcohol-induced mood disorder ([place]) 10/31/2015  . Moderate major depression ([place]) 04/21/2015  . Major depressive disorder, single episode, moderate ([place]) 03/10/2015  . Alcohol dependence with uncomplicated withdrawal ([place]) 03/03/2015  . Alcohol abuse   . Suicidal ideation   . Suicidal ideation 11/20/2014  . Anxiousness 09/11/2013  . Substance induced mood disorder ([place]) 06/20/2013  . S/P alcohol detoxification 09/27/2012  . Alcohol dependence ([place]) 01/29/2012  . Type II or unspecified type diabetes mellitus without mention of complication, uncontrolled 11/11/2011  . Thrombocytopenia ([place])   . Hyperglycemia   . Alcoholic ([place])   . Chronic hyponatremia   . Alcohol dependence with acute alcoholic intoxication ([place]) 11/06/2011  . Diabetes mellitus ([place]) 11/06/2011  . Hepatitis C 11/06/2011    Past Surgical History:  Procedure Laterality Date  . FRACTURE SURGERY  right lower leg plate placed years ago  . Plate in lower right leg  2008        Home Medications    Prior to Admission medications   Medication Sig Start Date End Date Taking? Authorizing Provider  Cyclobenzaprine HCl (FLEXERIL PO) Take by mouth.    [provider]  Elastic Bandages & Supports (MEDICAL COMPRESSION SOCKS) MISC Compression stocking daily 03/22/18   Tasia Catchings, Amy V, PA-C  furosemide (LASIX) 40 MG tablet Take 1 tablet (40 mg total) by mouth daily. 03/22/18   Tasia Catchings, Amy V, PA-C  gabapentin (NEURONTIN)  400 MG capsule Take 4 capsules (1,600 mg total) by mouth 3 (three) times daily for 7 days. 01/15/18 05/12/18  Wieters, Hallie C, PA-C  insulin glargine (LANTUS) 100 UNIT/ML injection Inject 0.3 mLs (30 Units total) into the skin at bedtime. Patient taking differently: Inject 45 Units into the skin at bedtime.  11/01/15   Patrecia Pour, NP  metFORMIN (GLUCOPHAGE) 1000 MG tablet Take 1 tablet (1,000 mg total) by mouth 2 (two) times daily with a meal. 11/01/15   Lord, Asa Saunas, NP  predniSONE  (STERAPRED UNI-PAK 21 TAB) 10 MG (21) TBPK tablet Take by mouth daily. Take as directed. 05/12/18   Vanessa Kick, MD  Sofosbuvir-Velpatasvir (EPCLUSA) 400-100 MG TABS Take 1 tablet by mouth daily. 01/24/18   [place], FNP    Family History Family History  Problem Relation Age of Onset  . ALS Father     Social History Social History   Tobacco Use  . Smoking status: Current Every Day Smoker    Packs/day: 1.00    Years: 40.00    Pack years: 40.00    Types: Cigarettes  . Smokeless tobacco: Never Used  Substance Use Topics  . Alcohol use: Not Currently    Comment: 18/daily- reports has not drink in 2 years  . Drug use: Yes    Frequency: 7.0 times per week    Types: Marijuana    Comment: for sleep     Allergies   Benadryl [diphenhydramine hcl]; Benadryl [diphenhydramine]; Sulfa antibiotics; Tegretol [carbamazepine]; Librium [chlordiazepoxide hcl]; Librium [chlordiazepoxide]; and Sulfa antibiotics   Review of Systems Review of Systems  Constitutional: Negative for chills and fever.  HENT: Negative for congestion and sore throat.   Eyes: Negative for visual disturbance.  Respiratory: Negative for cough, chest tightness and shortness of breath.   Cardiovascular: Negative for chest pain, palpitations and leg swelling.  Gastrointestinal: Positive for abdominal distention, abdominal pain, constipation and nausea. Negative for diarrhea and vomiting.  Genitourinary: Negative for dysuria and flank pain.  Musculoskeletal: Negative for back pain and myalgias.  Skin: Negative for rash.  Neurological: Negative for dizziness, syncope, light-headedness and headaches.     Physical Exam Updated Vital Signs BP (!) 161/82 (BP Location: Right Arm)   Pulse 99   Temp 98.9 F (37.2 C) (Oral)   Resp 17   Ht 6' (1.829 m)   Wt 87.5 kg   SpO2 99%   BMI 26.18 kg/m   Physical Exam Vitals signs and nursing note reviewed.  Constitutional:      General: He is not in acute  distress.    Appearance: He is well-developed.  HENT:     Head: Normocephalic and atraumatic.  Eyes:     Conjunctiva/sclera: Conjunctivae normal.     Pupils: Pupils are equal, round, and reactive to light.  Neck:     Musculoskeletal: Normal range of motion and neck supple.  Cardiovascular:     Rate and Rhythm: Normal rate and regular rhythm.     Heart sounds: S1 normal and S2 normal. No murmur.  Pulmonary:     Effort: Pulmonary effort is normal.     Breath sounds: Normal breath sounds. No wheezing or rales.  Abdominal:     General: Abdomen is protuberant. There is no distension.     Palpations: Abdomen is soft.     Tenderness: There is abdominal tenderness in the epigastric area, periumbilical area and left lower quadrant. There is no guarding or rebound.     Comments: Abdomen  overall tympanitic to percussion. Appears to have some right sided shifting dullness.   Musculoskeletal: Normal range of motion.        General: No deformity.  Lymphadenopathy:     Cervical: No cervical adenopathy.  Skin:    General: Skin is warm and dry.     Findings: No erythema or rash.  Neurological:     Mental Status: He is alert.     Comments: Cranial nerves grossly intact. Patient moves extremities symmetrically and with good coordination.  Psychiatric:        Behavior: Behavior normal.        Thought Content: Thought content normal.        Judgment: Judgment normal.      ED Treatments / Results  Labs (all labs ordered are listed, but only abnormal results are displayed) Labs Reviewed  CBC WITH DIFFERENTIAL/PLATELET  COMPREHENSIVE METABOLIC PANEL  LIPASE, BLOOD  URINALYSIS, ROUTINE W REFLEX MICROSCOPIC  PROTIME-INR  I-STAT TROPONIN, ED    EKG None  Radiology No results found.  Procedures Procedures (including critical care time)  Medications Ordered in ED Medications - No data to display   Initial Impression / Assessment and Plan / ED Course  I have reviewed the triage  vital signs and the nursing notes.  Pertinent labs & imaging results that were available during my care of the patient were reviewed by me and considered in my medical decision making (see chart for details).  Clinical Course as of May 31 1309  Wed May 31, 2018  1023 Improved from 2 months ago.   Hemoglobin(!): 10.5 [AM]  1109 Pt finished steroids one week ago. Remains afebrile.   WBC(!): 17.2 [AM]  1110 Pt given 1/2 amp D50.  Glucose(!): 60 [AM]  1310 Spoke with Dr. Olevia Bowens tried hospitalist to admit patient.  I appreciate his involvement in the care of this patient.   [AM]    Clinical Course User Index [AM] Albesa Seen, PA-C    Patient nontoxic-appearing, afebrile, hemodynamically stable, and with nonsurgical abdomen.  Differential diagnosis includes diverticulitis, nephrolithiasis, AAA, pancreatitis, ischemia,  ACS.  Low suspicion for AAA given that patient had complete abdominal ultrasound in September 2019 which showed nonaneurysmal aorta.  He is overall pain-free at this time and just feels generally fatigued.  Lab work demonstrating chronic hyponatremia.  Patient had hypoglycemia that was corrected.  Leukocytosis to 17.2, unclear if from primary process versus recent steroid use.  Stable anemia at 10.5.  Minimal elevation in transaminases.  Alk phos minimally elevated at 127.  Albumin is significantly low at 1.7, likely accounting for anasarca.  CT abdomen and pelvis is showing multiple attenuating spots on the liver.  Patient also has a lung nodule and will need further CT chest.  Radiologist also recommending MRI with and without contrast of abdomen to assess further liver and biliary tree.  12:51 PM spoke with PA Alonza Bogus of [facility] gastroenterology who states that admission hospice is reasonable given patient's multiple conditions currently.  Appears to be having worsening liver disease with possible hepatocellular carcinoma and will need further imaging.  She also discussed  possibility of diagnostic and therapeutic paracentesis.  I appreciate her involvement in the care of this patient.  Will consult hospital medicine.  This is a shared visit with Dr. Gerlene Fee. Patient was independently evaluated by this attending physician. Attending physician consulted in evaluation and admission management.  Final Clinical Impressions(s) / ED Diagnoses   Final diagnoses:  Liver nodule  Lung  nodule  Anasarca  Hypoglycemia    ED Discharge Orders    None       [name] 05/31/18 1312    Maudie Flakes, MD 06/01/18 [phone]

## 2018-05-31 NOTE — H&P (Signed)
4        History and Physical    [name] UUV:[id number] DOB: 1956/03/08 DOA: 05/31/2018  PCP: Marliss Coots, NP  Patient coming from: Home.  I have personally briefly reviewed patient's old medical records in [place]  Chief Complaint: Abdominal pain.  HPI: [name] is a 63 y.o. male with medical history significant of alcoholic liver cirrhosis, anxiety, depression, chronic hyponatremia, type 2 diabetes hepatitis C treated with Epclusa, diabetic peripheral neuropathy, substance abuse, thrombocytopenia, tobacco use who is coming to the emergency department with complaints of moderate to severe abdominal pain since about 3 to 4 days morning that did not get relief with calcium carbonate.  He complains of mild nausea, but denies emesis, diarrhea, constipation, melena or hematochezia.  He denies dysuria, frequency or hematuria.  He denies fever, chills, feels mildly fatigued.  No sore throat, rhinorrhea, wheezing or hemoptysis.  Denies chest pain, palpitations, dizziness, diaphoresis, PND or orthopnea.  However, he complains of progressively worse lower extremities edema for the past 2 weeks.  He denies polyuria, polydipsia, polyphagia or blurred vision.  He denies icterus, skin rashes or pruritus.  ED Course: Initial vital signs temperature 98.9 F, pulse 101, respiration 17, blood pressure 161/82 mmHg O2 sat 99% on room air.  The patient was given dextrose 25 g IVP.  His urinalysis had a hazy appearance, moderate hemoglobinuria, more than 300 mg a deciliter of proteinuria and rare bacteria on microscopic examination.  CBC showed a white count 17.2 with 80% neutrophils, 10% lymphocytes 9% monocytes.  Hemoglobin 10.5 g/dL and platelets 399.  PT is 14.0 and INR 1.09.  Sodium is 131, potassium 3.8, chloride 94, CO2 30 mmol/L.  BUN was 13, creatinine 0.64, glucose 60, calcium 8.2, phosphorus 3.0 magnesium 1.6 mg/dL.  Liver function tests showed normal total protein, but  decreased albumin at 1.7 g/dL.  Alkaline phosphatase 127, AST was 43 and ALT was 22 units/L.  Lipase was normal.  Imaging: CTA abdomen/pelvis with contrast shows liver cirrhosis with doing hyperenhancing liver masses 2.3 and 2.0 cm, both in the right liver lobe.  There was ascites, mild anasarca, nonspecific mild diffuse gallbladder wall thickening with silo cholelithiasis, likely due to noninflammatory edema.  There is also mild upper retroperitoneal adenopathy which is nonspecific, particularly metastatic CT abdomen with IV contrast follow-up in 3 months is recommended.  Right lower lobe 4 mm solid pulmonary nodule was seen in a dedicated chest CT is recommended for this.  Patient prostate was also enlarged.  He did show aortic atherosclerosis.  Please see images and full radiology report for further detail.  Review of Systems: As per HPI otherwise 10 point review of systems negative.   Past Medical History:  Diagnosis Date  . Alcoholic ([place])   . Alcoholic cirrhosis of liver ([place])    Pt calls this a false diagnosis. Pt denies  . Alcoholism /alcohol abuse ([place])   . Anxiety   . Chronic hyponatremia    pt denies having   . Depression   . Diabetes mellitus   . Diabetes mellitus without complication ([place])   . Hepatitis C   . Hyperglycemia   . Neuropathy   . Substance abuse ([place])   . Thrombocytopenia ([place])   . Tobacco use 05/31/2018    Past Surgical History:  Procedure Laterality Date  . FRACTURE SURGERY  right lower leg plate placed years ago  . Plate in lower right leg  2008     reports that  he has been smoking cigarettes. He has a 40.00 pack-year smoking history. He has never used smokeless tobacco. He reports previous alcohol use. He reports current drug use. Frequency: 7.00 times per week. Drug: Marijuana.  Allergies  Allergen Reactions  . Benadryl [Diphenhydramine Hcl] Other (See Comments)    "Causes nervousness"  . Benadryl [Diphenhydramine] Other (See Comments)    "makes my  skin crawl"  . Sulfa Antibiotics Other (See Comments)    Childhood allergy  . Tegretol [Carbamazepine] Other (See Comments)    "almost killed me"  . Librium [Chlordiazepoxide Hcl] Anxiety  . Librium [Chlordiazepoxide] Anxiety  . Sulfa Antibiotics Rash    Family History  Problem Relation Age of Onset  . ALS Father    Prior to Admission medications   Medication Sig Start Date End Date Taking? Authorizing Provider  albuterol (PROVENTIL HFA;VENTOLIN HFA) 108 (90 Base) MCG/ACT inhaler Inhale 2 puffs into the lungs every 6 (six) hours as needed for wheezing or shortness of breath.   Yes [provider]  gabapentin (NEURONTIN) 400 MG capsule Take 4 capsules (1,600 mg total) by mouth 3 (three) times daily for 7 days. 01/15/18 05/31/18 Yes Wieters, Hallie C, PA-C  insulin glargine (LANTUS) 100 UNIT/ML injection Inject 0.3 mLs (30 Units total) into the skin at bedtime. Patient taking differently: Inject 45 Units into the skin at bedtime.  11/01/15  Yes Patrecia Pour, NP  metFORMIN (GLUCOPHAGE) 1000 MG tablet Take 1 tablet (1,000 mg total) by mouth 2 (two) times daily with a meal. 11/01/15  Yes Patrecia Pour, NP  Cyclobenzaprine HCl (FLEXERIL PO) Take by mouth.    [provider]  Elastic Bandages & Supports (MEDICAL COMPRESSION SOCKS) MISC Compression stocking daily 03/22/18   Tasia Catchings, Amy V, PA-C  furosemide (LASIX) 40 MG tablet Take 1 tablet (40 mg total) by mouth daily. Patient not taking: Reported on 05/31/2018 03/22/18   Ok Edwards, PA-C  predniSONE (STERAPRED UNI-PAK 21 TAB) 10 MG (21) TBPK tablet Take by mouth daily. Take as directed. Patient not taking: Reported on 05/31/2018 05/12/18   Vanessa Kick, MD  Sofosbuvir-Velpatasvir (EPCLUSA) 400-100 MG TABS Take 1 tablet by mouth daily. Patient not taking: Reported on 05/31/2018 01/24/18   [place], FNP    Physical Exam: Vitals:   05/31/18 1142 05/31/18 1300 05/31/18 1333 05/31/18 1420  BP: (!) 194/112 (!) 184/101 (!)  178/106 (!) 160/92  Pulse: 97 (!) 112 (!) 114 91  Resp: _0 Temp:    97.7 F (36.5 C)  TempSrc:    Oral  SpO2: 99% 95% 99% 100%  Weight:    89.9 kg  Height:    6' (1.829 m)    Constitutional: NAD, calm, comfortable Eyes: PERRL, lids and conjunctivae normal ENMT: Mucous membranes are moist. Posterior pharynx clear of any exudate or lesions. Neck: normal, supple, no masses, no thyromegaly Respiratory: clear to auscultation bilaterally, no wheezing, no crackles. Normal respiratory effort. No accessory muscle use.  Cardiovascular: Regular rate and rhythm, no murmurs / rubs / gallops. No extremity edema. 2+ pedal pulses. No carotid bruits.  Abdomen: Positive ascites, soft, mildly tympanic, positive for epigastric and LLQ tenderness, no guarding or rebound, no masses palpated. No hepatosplenomegaly. Bowel sounds positive.  Musculoskeletal: no clubbing / cyanosis. Good ROM, no contractures. Normal muscle tone.  Skin: no gross rashes, lesions, ulcers on limited dermatological examination Neurologic: CN 2-12 grossly intact. Sensation intact, DTR normal. Strength 5/5 in all 4.  Psychiatric: Normal judgment and  insight. Alert and oriented x 4. Normal mood.   Labs on Admission: I have personally reviewed following labs and imaging studies  CBC: Recent Labs  Lab 05/31/18 0853  WBC 17.2*  NEUTROABS 13.7*  HGB 10.5*  HCT 31.9*  MCV 94.4  PLT 751   Basic Metabolic Panel: Recent Labs  Lab 05/31/18 0853  NA 131*  K 3.8  CL 94*  CO2 30  GLUCOSE 60*  BUN 13  CREATININE 0.64  CALCIUM 8.2*  MG 1.6*  PHOS 3.0   GFR: Estimated Creatinine Clearance: 106.4 mL/min (by C-G formula based on SCr of 0.64 mg/dL). Liver Function Tests: Recent Labs  Lab 05/31/18 0853  AST 43*  ALT 22  ALKPHOS 127*  BILITOT 0.8  PROT 6.8  ALBUMIN 1.7*   Recent Labs  Lab 05/31/18 0853  LIPASE 28   No results for input(s): AMMONIA in the last 168 hours. Coagulation Profile: Recent Labs  Lab  05/31/18 0853  INR 1.09   Cardiac Enzymes: No results for input(s): CKTOTAL, CKMB, CKMBINDEX, TROPONINI in the last 168 hours. BNP (last 3 results) No results for input(s): PROBNP in the last 8760 hours. HbA1C: No results for input(s): HGBA1C in the last 72 hours. CBG: Recent Labs  Lab 05/31/18 1029  GLUCAP 127*   Lipid Profile: No results for input(s): CHOL, HDL, LDLCALC, TRIG, CHOLHDL, LDLDIRECT in the last 72 hours. Thyroid Function Tests: No results for input(s): TSH, T4TOTAL, FREET4, T3FREE, THYROIDAB in the last 72 hours. Anemia Panel: No results for input(s): VITAMINB12, FOLATE, FERRITIN, TIBC, IRON, RETICCTPCT in the last 72 hours. Urine analysis:    Component Value Date/Time   COLORURINE YELLOW 05/31/2018 0853   APPEARANCEUR HAZY (A) 05/31/2018 0853   APPEARANCEUR Clear 10/13/2012 2130   LABSPEC 1.015 05/31/2018 0853   LABSPEC 1.002 10/13/2012 2130   PHURINE 6.0 05/31/2018 0853   GLUCOSEU NEGATIVE 05/31/2018 0853   GLUCOSEU >=500 10/13/2012 2130   HGBUR MODERATE (A) 05/31/2018 0853   BILIRUBINUR NEGATIVE 05/31/2018 0853   BILIRUBINUR Negative 10/13/2012 2130   KETONESUR NEGATIVE 05/31/2018 0853   PROTEINUR >=300 (A) 05/31/2018 0853   UROBILINOGEN 0.2 11/06/2014 1910   NITRITE NEGATIVE 05/31/2018 0853   LEUKOCYTESUR NEGATIVE 05/31/2018 0853   LEUKOCYTESUR Negative 10/13/2012 2130    Radiological Exams on Admission: Ct Abdomen Pelvis W Contrast  Result Date: 05/31/2018 CLINICAL DATA:  History of hepatitis C cirrhosis. Nausea. Abdominal pain and distention. Constipation. EXAM: CT ABDOMEN AND PELVIS WITH CONTRAST TECHNIQUE: Multidetector CT imaging of the abdomen and pelvis was performed using the standard protocol following bolus administration of intravenous contrast. CONTRAST:  168m ISOVUE-300 IOPAMIDOL (ISOVUE-300) INJECTION 61% COMPARISON:  01/24/2018 abdominal sonogram. FINDINGS: Lower chest: Mild hypoventilatory changes at the dependent lung bases. Limited  visualization of a right lower lobe 4 mm pulmonary nodule (series 8/image 1). Moderate lower esophageal varices. Coronary atherosclerosis. Hepatobiliary: Diffusely irregular liver surface compatible with hepatic cirrhosis. Peripheral segment 8 right liver lobe 2.3 x 2.2 cm mass (series 2/image 24) demonstrates arterial hyperenhancement and portal phase washout. Posterior segment 6 right liver lobe 2.0 x 1.8 cm mass (series 2/image 39) also demonstrates arterial hyperenhancement and portal phase washout. A few scattered coarse subcentimeter right liver lobe granulomatous calcifications. No additional liver lesions. Distended gallbladder (5.8 cm diameter). Mild diffuse gallbladder wall thickening. No radiopaque cholelithiasis. No biliary ductal dilatation. Pancreas: Normal, with no mass or duct dilation. Spleen: Normal size. No mass. Adrenals/Urinary Tract: Normal adrenals. Normal kidneys with no hydronephrosis and no renal mass. Normal bladder.  Stomach/Bowel: Normal non-distended stomach. Normal caliber small bowel with no small bowel wall thickening. Normal appendix. Normal large bowel with no diverticulosis, large bowel wall thickening or pericolonic fat stranding. Vascular/Lymphatic: Atherosclerotic nonaneurysmal abdominal aorta. Patent portal, splenic, hepatic and renal veins. Mildly enlarged 1.2 cm portacaval node (series 4/image 45). Mildly enlarged 1.2 cm left para celiac node (series 4/image 48). Mildly enlarged posterior peripancreatic nodes up to 1.2 cm (series 4/image 54). No pathologically enlarged pelvic nodes. Reproductive: Mildly enlarged prostate with nonspecific internal prostatic calcifications. Other: No pneumoperitoneum. Small to moderate volume ascites. No focal fluid collection. Mild anasarca. Musculoskeletal: No aggressive appearing focal osseous lesions. Mild thoracolumbar spondylosis. IMPRESSION: 1. Hepatic cirrhosis. 2. Two hyperenhancing liver masses (2.3 cm in segment 8 right liver lobe  and 2.0 cm in segment 6 right liver lobe) with features highly suspicious for multifocal hepatocellular carcinoma. MRI abdomen without and with IV contrast is indicated for further characterization. 3. Small to moderate volume ascites. Moderate esophageal varices. Mild anasarca. 4. Nonspecific mild diffuse gallbladder wall thickening without radiopaque cholelithiasis, potentially due to noninflammatory edema. 5. Mild upper retroperitoneal adenopathy is nonspecific and could be reactive or metastatic. CT abdomen with IV contrast follow-up recommended in 3 months. 6. Right lower lobe 4 mm solid pulmonary nodule. Dedicated chest CT recommended for further evaluation. 7. Mild prostatomegaly. 8.  Aortic Atherosclerosis (ICD10-I70.0). Electronically Signed   By: Ilona Sorrel M.D.   On: 05/31/2018 11:49    EKG: Independently reviewed. Vent. rate 101 BPM PR interval * ms QRS duration 105 ms QT/QTc 356/462 ms P-R-T axes 72 63 16 Sinus tachycardia Borderline T wave abnormalities (not seen on previous EKG/April 2017).  Assessment/Plan Principal Problem:   Liver mass Observation/telemetry. Check MRI of abdomen. Gastroenterology has been consulted. GI planning diagnostic paracentesis. Consider IR evaluation for liver masses biopsy.  Active Problems:   Lung nodule < 6cm on CT Discussed briefly with the patient. Dedicated CT chest for further evaluation.    Abnormal EKG Cardiac telemetry. Repeat electrocardiogram. Get echocardiogram in the setting of worsening edema.    Chronic hyponatremia Secondary to liver cirrhosis. Consider fluid restriction if he worsens. Monitor sodium level.    Neuropathy Continue Neurontin 1600 mg p.o. 3 times daily    Hypomagnesemia Replacing. Follow-up level as needed.    Anemia Check anemia panel. Monitor hematocrit and hemoglobin.    Hypoglycemia The patient stated that is probably due to decrease oral intake since last night.  He is eating now.   However, will decrease Lantus from 45 to 35 units at bedtime.  CBG monitoring before meals and bedtime.*Regular insulin sliding scale if the patient becomes hyperglycemic.    Type 2 diabetes mellitus (HCC) Carbohydrate modified diet. Continue Lantus, but at a lower dose to avoid morning hypoglycemia. CBG monitoring with sliding scale coverage for the moment.    Leukocytosis No fever or obvious source of infection. This is probably reactive. Follow-up WBC in a.m.    Tobacco use Declined nicotine replacement therapy for the moment. Staff to provide tobacco cessation information.   DVT prophylaxis: SCDs. Code Status: Full code. Family Communication: Disposition Plan: Observation for further work-up and evaluation. Consults called: Gastroenterology will be consulted on the patient. Admission status: Observation/telemetry.   Reubin Milan MD [facility]  05/31/2018, 2:42 PM

## 2018-05-31 NOTE — Discharge Planning (Signed)
EDCM spoke with Marliss Coots, NP at [facility] to arrange ABI.  Audrea Muscat states she can arrange this through congregational nurses when patient presents with AVS from ED.

## 2018-05-31 NOTE — Consult Note (Addendum)
Referring Provider: Dr. Langston Masker Primary Care Physician:  Synthia Innocent Audrea Muscat, NP Primary Gastroenterologist:  Dr.  Luiz Iron for Consultation:  LLQ abdominal pain, liver mass seen on abdominal/pelvic CT  HPI: [name] is a 63 y.o. male with a history of alcohol use, chronic hepatitis C (history of IV drug use in his 60's)  treated with Epclusa,  DM II on Metformin and neuropathy who presents to the ED today with LLQ abdominal pain and fatigue. He awakened at 3am with sharp left mid to LLQ pain. He took TUMs with mild relief. He developed nausea, sweats and dizziness. No vomiting. No fever. No weight loss. He has been constipated. No BM for a few days. He feels he will have a BM soon. Occasional watery diarrhea. No rectal bleeding or melena. He complains of swelling to his abdomen and legs for the past 3 months which seemed to start after he fell off a latter without any significant injury. He stated he was told he had cirrhosis a few years ago, however, he did not see a hepatologist or GI. He stopped drinking all alcohol 2 years ago so he could be treated for chronic hepatitis C. He completed a 12 week course of Epclusa 04/2018 as prescribed by ID. A post treatment Hep C RNA was not found in his chart.  He developed a cough for 2 months and he went to the urgent care clinic 05/12/2018. A chest xray showed prominent brochovascular markings bilaterally, no evidence of pneumonia. He was prescribed a Prednisone taper for 6 days which he completed. His cough improved. He smokes cigarettes 1ppd. Never had a screening colonoscopy. He lives in a homeless shelter.  Labs in the ED: CMP: Na 131. K 3.8. BUN 13. Cr. 0.64. Mg 1.6. Alk phos 127. T. Bili 0.8. AST 43. ALT 22. CBC: WBC 17.2. Hg 10.5. HCT 31.9. MCV 94.4. PLT 399. PT 14. INR 1.09. UA: Bili neg. Hgb moderate. Leukocyte neg. Nitrite neg. Protein > 300. HIV, AFP ordered.  An abdominal/pelvic CT identified: 1. Hepatic cirrhosis. 2. Two  hyperenhancing liver masses (2.3 cm in segment 8 right liver lobe and 2.0 cm in segment 6 right liver lobe) with features highly suspicious for multifocal hepatocellular carcinoma. MRI abdomen without and with IV contrast is indicated for further characterization. 3. Small to moderate volume ascites. Moderate esophageal varices. Mild anasarca. 4. Nonspecific mild diffuse gallbladder wall thickening without radiopaque cholelithiasis, potentially due to noninflammatory edema. 5. Mild upper retroperitoneal adenopathy is nonspecific and could be reactive or metastatic. CT abdomen with IV contrast follow-up recommended in 3 months. 6. Right lower lobe 4 mm solid pulmonary nodule. Dedicated chest CT recommended for further evaluation. 7. Mild prostatomegaly. 8.  Aortic Atherosclerosis    Past Medical History:  Diagnosis Date  . Alcoholic ([place])   . Alcoholic cirrhosis of liver ([place])    Pt calls this a false diagnosis. Pt denies  . Alcoholism /alcohol abuse ([place])   . Anxiety   . Chronic hyponatremia    pt denies having   . Depression   . Diabetes mellitus   . Diabetes mellitus without complication ([place])   . Hepatitis C   . Hyperglycemia   . Neuropathy   . Substance abuse ([place])   . Thrombocytopenia ([place])     Past Surgical History:  Procedure Laterality Date  . FRACTURE SURGERY  right lower leg plate placed years ago  . Plate in lower right leg  2008    Prior to Admission medications  Medication Sig Start Date End Date Taking? Authorizing Provider  albuterol (PROVENTIL HFA;VENTOLIN HFA) 108 (90 Base) MCG/ACT inhaler Inhale 2 puffs into the lungs every 6 (six) hours as needed for wheezing or shortness of breath.   Yes [provider]  gabapentin (NEURONTIN) 400 MG capsule Take 4 capsules (1,600 mg total) by mouth 3 (three) times daily for 7 days. 01/15/18 05/31/18 Yes Wieters, Hallie C, PA-C  insulin glargine (LANTUS) 100 UNIT/ML injection Inject 0.3 mLs (30 Units total)  into the skin at bedtime. Patient taking differently: Inject 45 Units into the skin at bedtime.  11/01/15  Yes Patrecia Pour, NP  metFORMIN (GLUCOPHAGE) 1000 MG tablet Take 1 tablet (1,000 mg total) by mouth 2 (two) times daily with a meal. 11/01/15  Yes Patrecia Pour, NP  Cyclobenzaprine HCl (FLEXERIL PO) Take by mouth.    [provider]  Elastic Bandages & Supports (MEDICAL COMPRESSION SOCKS) MISC Compression stocking daily 03/22/18   Tasia Catchings, Amy V, PA-C  furosemide (LASIX) 40 MG tablet Take 1 tablet (40 mg total) by mouth daily. Patient not taking: Reported on 05/31/2018 03/22/18   Ok Edwards, PA-C  predniSONE (STERAPRED UNI-PAK 21 TAB) 10 MG (21) TBPK tablet Take by mouth daily. Take as directed. Patient not taking: Reported on 05/31/2018 05/12/18   Vanessa Kick, MD  Sofosbuvir-Velpatasvir (EPCLUSA) 400-100 MG TABS Take 1 tablet by mouth daily. Patient not taking: Reported on 05/31/2018 01/24/18   [place], FNP    Current Facility-Administered Medications  Medication Dose Route Frequency Provider Last Rate Last Dose  . iopamidol (ISOVUE-300) 61 % injection           . labetalol (NORMODYNE,TRANDATE) injection 10 mg  10 mg Intravenous Q2H PRN Reubin Milan, MD   10 mg at 05/31/18 1334  . sodium chloride (PF) 0.9 % injection            Current Outpatient Medications  Medication Sig Dispense Refill  . albuterol (PROVENTIL HFA;VENTOLIN HFA) 108 (90 Base) MCG/ACT inhaler Inhale 2 puffs into the lungs every 6 (six) hours as needed for wheezing or shortness of breath.    . gabapentin (NEURONTIN) 400 MG capsule Take 4 capsules (1,600 mg total) by mouth 3 (three) times daily for 7 days. 84 capsule 0  . insulin glargine (LANTUS) 100 UNIT/ML injection Inject 0.3 mLs (30 Units total) into the skin at bedtime. (Patient taking differently: Inject 45 Units into the skin at bedtime. ) 10 mL 11  . metFORMIN (GLUCOPHAGE) 1000 MG tablet Take 1 tablet (1,000 mg total) by mouth 2 (two) times  daily with a meal. 6 tablet 0  . Cyclobenzaprine HCl (FLEXERIL PO) Take by mouth.    Water engineer Bandages & Supports (MEDICAL COMPRESSION SOCKS) MISC Compression stocking daily 2 each 0  . furosemide (LASIX) 40 MG tablet Take 1 tablet (40 mg total) by mouth daily. (Patient not taking: Reported on 05/31/2018) 5 tablet 0  . predniSONE (STERAPRED UNI-PAK 21 TAB) 10 MG (21) TBPK tablet Take by mouth daily. Take as directed. (Patient not taking: Reported on 05/31/2018) 21 tablet 0  . Sofosbuvir-Velpatasvir (EPCLUSA) 400-100 MG TABS Take 1 tablet by mouth daily. (Patient not taking: Reported on 05/31/2018) 28 tablet 2    Allergies as of 05/31/2018 - Review Complete 05/31/2018  Allergen Reaction Noted  . Benadryl [diphenhydramine hcl] Other (See Comments) 09/27/2012  . Benadryl [diphenhydramine] Other (See Comments) 03/02/2015  . Sulfa antibiotics Other (See Comments) 03/02/2015  . Tegretol [carbamazepine] Other (See  Comments) 11/20/2014  . Librium [chlordiazepoxide hcl] Anxiety 12/20/2011  . Librium [chlordiazepoxide] Anxiety 10/31/2015  . Sulfa antibiotics Rash 11/05/2011    Family History  Problem Relation Age of Onset  . ALS Father     Social History   Socioeconomic History  . Marital status: Divorced    Spouse name: Not on file  . Number of children: Not on file  . Years of education: Not on file  . Highest education level: Not on file  Occupational History  . Not on file  Social Needs  . Financial resource strain: Not on file  . Food insecurity:    Worry: Not on file    Inability: Not on file  . Transportation needs:    Medical: Not on file    Non-medical: Not on file  Tobacco Use  . Smoking status: Current Every Day Smoker    Packs/day: 1.00    Years: 40.00    Pack years: 40.00    Types: Cigarettes  . Smokeless tobacco: Never Used  Substance and Sexual Activity  . Alcohol use: Not Currently    Comment: 19/daily- reports has not drink in 2 years  . Drug use: Yes     Frequency: 7.0 times per week    Types: Marijuana    Comment: for sleep  . Sexual activity: Not Currently  Lifestyle  . Physical activity:    Days per week: Not on file    Minutes per session: Not on file  . Stress: Not on file  Relationships  . Social connections:    Talks on phone: Not on file    Gets together: Not on file    Attends religious service: Not on file    Active member of club or organization: Not on file    Attends meetings of clubs or organizations: Not on file    Relationship status: Not on file  . Intimate partner violence:    Fear of current or ex partner: Not on file    Emotionally abused: Not on file    Physically abused: Not on file    Forced sexual activity: Not on file  Other Topics Concern  . Not on file  Social History Narrative   ** Merged History Encounter **        Review of Systems: Gen: Denies any fever, anorexia, weakness, malaise, weight loss, and sleep disorder CV: Denies chest pain, angina, palpitations, syncope, orthopnea, PND, peripheral edema, and claudication. Resp: Denies dyspnea at rest, dyspnea with exercise, sputum, wheezing, coughing up blood, and pleurisy. GI: Denies vomiting blood, jaundice, and fecal incontinence.   Denies dysphagia or odynophagia. GU : Denies urinary burning, blood in urine, nocturnal urination, and urinary incontinence. MS: Denies joint pain, limitation of movement, stiffness, low back pain, extremity pain. Denies muscle weakness, cramps, atrophy.  Neuro: Denies confusion. Heme: Denies bruising, bleeding, and enlarged lymph nodes. Neuro:  Denies any headaches, dizziness, paresthesias.   Physical Exam: Vital signs in last 24 hours: Temp:  [98.9 F (37.2 C)] 98.9 F (37.2 C) (01/22 0811) Pulse Rate:  [97-114] 114 (01/22 1333) Resp:  [16-18] 16 (01/22 1333) BP: (161-194)/(82-112) 178/106 (01/22 1333) SpO2:  [95 %-99 %] 99 % (01/22 1333) Weight:  [87.5 kg] 87.5 kg (01/22 0819)   General:   Alert,   Well-developed, well-nourished, pleasant and cooperative in NAD Eyes:  Sclera clear, no icterus.   Conjunctiva pink. Lungs:  Clear throughout to auscultation.   No wheezes, crackles, or rhonchi.  Heart:  Regular rate  and rhythm; no murmurs, clicks, rubs,  or gallops. Abdomen:  Soft,nontender, BS active,nonpalp mass or hsm.   Rectal:  Deferred  Msk:  Symmetrical without gross deformities. . Pulses:  Normal pulses noted. Extremities: bilateral LE 2+ pitting edema, brawny stasis dermatitis to LE bilaterally Neurologic:  Alert and  oriented x4;  No asterixis. Grossly normal neurologically. Skin:  Intact without significant lesions or rashes.. Psych:  Alert and cooperative. Normal mood and affect.  Intake/Output from previous day: No intake/output data recorded. Intake/Output this shift: No intake/output data recorded.  Lab Results: Recent Labs    05/31/18 0853  WBC 17.2*  HGB 10.5*  HCT 31.9*  PLT 399   BMET Recent Labs    05/31/18 0853  NA 131*  K 3.8  CL 94*  CO2 30  GLUCOSE 60*  BUN 13  CREATININE 0.64  CALCIUM 8.2*   LFT Recent Labs    05/31/18 0853  PROT 6.8  ALBUMIN 1.7*  AST 43*  ALT 22  ALKPHOS 127*  BILITOT 0.8   PT/INR Recent Labs    05/31/18 0853  LABPROT 14.0  INR 1.09     Studies/Results: Ct Abdomen Pelvis W Contrast  Result Date: 05/31/2018 CLINICAL DATA:  History of hepatitis C cirrhosis. Nausea. Abdominal pain and distention. Constipation. EXAM: CT ABDOMEN AND PELVIS WITH CONTRAST TECHNIQUE: Multidetector CT imaging of the abdomen and pelvis was performed using the standard protocol following bolus administration of intravenous contrast. CONTRAST:  19m ISOVUE-300 IOPAMIDOL (ISOVUE-300) INJECTION 61% COMPARISON:  01/24/2018 abdominal sonogram. FINDINGS: Lower chest: Mild hypoventilatory changes at the dependent lung bases. Limited visualization of a right lower lobe 4 mm pulmonary nodule (series 8/image 1). Moderate lower esophageal varices.  Coronary atherosclerosis. Hepatobiliary: Diffusely irregular liver surface compatible with hepatic cirrhosis. Peripheral segment 8 right liver lobe 2.3 x 2.2 cm mass (series 2/image 24) demonstrates arterial hyperenhancement and portal phase washout. Posterior segment 6 right liver lobe 2.0 x 1.8 cm mass (series 2/image 39) also demonstrates arterial hyperenhancement and portal phase washout. A few scattered coarse subcentimeter right liver lobe granulomatous calcifications. No additional liver lesions. Distended gallbladder (5.8 cm diameter). Mild diffuse gallbladder wall thickening. No radiopaque cholelithiasis. No biliary ductal dilatation. Pancreas: Normal, with no mass or duct dilation. Spleen: Normal size. No mass. Adrenals/Urinary Tract: Normal adrenals. Normal kidneys with no hydronephrosis and no renal mass. Normal bladder. Stomach/Bowel: Normal non-distended stomach. Normal caliber small bowel with no small bowel wall thickening. Normal appendix. Normal large bowel with no diverticulosis, large bowel wall thickening or pericolonic fat stranding. Vascular/Lymphatic: Atherosclerotic nonaneurysmal abdominal aorta. Patent portal, splenic, hepatic and renal veins. Mildly enlarged 1.2 cm portacaval node (series 4/image 45). Mildly enlarged 1.2 cm left para celiac node (series 4/image 48). Mildly enlarged posterior peripancreatic nodes up to 1.2 cm (series 4/image 54). No pathologically enlarged pelvic nodes. Reproductive: Mildly enlarged prostate with nonspecific internal prostatic calcifications. Other: No pneumoperitoneum. Small to moderate volume ascites. No focal fluid collection. Mild anasarca. Musculoskeletal: No aggressive appearing focal osseous lesions. Mild thoracolumbar spondylosis. IMPRESSION: 1. Hepatic cirrhosis. 2. Two hyperenhancing liver masses (2.3 cm in segment 8 right liver lobe and 2.0 cm in segment 6 right liver lobe) with features highly suspicious for multifocal hepatocellular carcinoma.  MRI abdomen without and with IV contrast is indicated for further characterization. 3. Small to moderate volume ascites. Moderate esophageal varices. Mild anasarca. 4. Nonspecific mild diffuse gallbladder wall thickening without radiopaque cholelithiasis, potentially due to noninflammatory edema. 5. Mild upper retroperitoneal adenopathy is nonspecific and could be  reactive or metastatic. CT abdomen with IV contrast follow-up recommended in 3 months. 6. Right lower lobe 4 mm solid pulmonary nodule. Dedicated chest CT recommended for further evaluation. 7. Mild prostatomegaly. 8.  Aortic Atherosclerosis (ICD10-I70.0). Electronically Signed   By: Ilona Sorrel M.D.   On: 05/31/2018 11:49    IMPRESSION:  1. LLQ abdominal pain most likely due to ascites (R/O SBP), constipation 2. Cirrhosis, history of ETOH abuse (abstinent for 2 years) and Chronic Hepatitis C (treated with Epclusa 01/2028) 3. Liver masses per CT: Two hyperenhancing liver masses (2.3 cm in segment 8 right liver lobe and 2.0 cm in segment 6 right liver lobe) with features highly suspicious for multifocal hepatocellular carcinoma. 4. Ascites. 5. Leukocytosis, rule out SBP, unlikely residual effects from Prednisone taper prescribed 05/12/2018-05/18/2018. 6. Normocytic anemia secondary to cirrhosis 7. Esophageal Varices per abd/pelvic CT 8. Hyponatremia secondary to liver disease. 9. Pulmonary nodules   PLAN: 1. Diagnostic paracentesis by IR to include cell count with diff, albumin, protein, culture, gram stain and cytology. 2. Abdominal MRI following paracentesis 3.  Follow labs, awaiting AFP and HIV, also ordered Hep C RNA Quant 4.  Future EGD, assess esophageal varices.     Noralyn Pick  05/31/2018, 1:45 PM   Attending physician's note   I have taken a history, examined the patient and reviewed the chart. I agree with the Advanced Practitioner's note, impression and recommendations.  38 yr M with ETOH and Hep C  cirrhosis decompensated by ascites and ?[place].  CT abd & pelvis with 2 lesions concerning for HCC  Leucocytosis and abdominal pain,will need to exclude SBP Diagnostic paracentesis with cell count, cytology, culture and albumin level MRI liver protocol AFP Will follow MRI findings and determine if will meet criteria for potential chemoembolization   F/u Hep C viral load to confirm SVR  Consider EGD inpatient vs outpatient for esophageal varices screening  Pulmonary nodule: f/u CT chest  Hyponatremia: mild, secondary to cirrhosis Will continue to monitor, f/u BMP tomorrow AM   Damaris Hippo , MD [phone]

## 2018-05-31 NOTE — ED Triage Notes (Signed)
[facility]/ c/o LLQ abdominal pain that started at 0300 today.  Pt reports taking tums, with little relief.

## 2018-05-31 NOTE — ED Notes (Signed)
Bed: WA09 Expected date:  Expected time:  Means of arrival:  Comments: EMS/abd. pain

## 2018-06-01 ENCOUNTER — Inpatient Hospital Stay (HOSPITAL_COMMUNITY): Payer: Medicaid Other

## 2018-06-01 DIAGNOSIS — E162 Hypoglycemia, unspecified: Secondary | ICD-10-CM | POA: Diagnosis not present

## 2018-06-01 DIAGNOSIS — R188 Other ascites: Secondary | ICD-10-CM

## 2018-06-01 DIAGNOSIS — D508 Other iron deficiency anemias: Secondary | ICD-10-CM | POA: Diagnosis not present

## 2018-06-01 DIAGNOSIS — R160 Hepatomegaly, not elsewhere classified: Secondary | ICD-10-CM | POA: Diagnosis not present

## 2018-06-01 DIAGNOSIS — I700 Atherosclerosis of aorta: Secondary | ICD-10-CM | POA: Diagnosis present

## 2018-06-01 DIAGNOSIS — E871 Hypo-osmolality and hyponatremia: Secondary | ICD-10-CM

## 2018-06-01 DIAGNOSIS — E1142 Type 2 diabetes mellitus with diabetic polyneuropathy: Secondary | ICD-10-CM | POA: Diagnosis present

## 2018-06-01 DIAGNOSIS — K828 Other specified diseases of gallbladder: Secondary | ICD-10-CM | POA: Diagnosis present

## 2018-06-01 DIAGNOSIS — D638 Anemia in other chronic diseases classified elsewhere: Secondary | ICD-10-CM | POA: Diagnosis present

## 2018-06-01 DIAGNOSIS — Z820 Family history of epilepsy and other diseases of the nervous system: Secondary | ICD-10-CM | POA: Diagnosis not present

## 2018-06-01 DIAGNOSIS — E1165 Type 2 diabetes mellitus with hyperglycemia: Secondary | ICD-10-CM | POA: Diagnosis not present

## 2018-06-01 DIAGNOSIS — K5900 Constipation, unspecified: Secondary | ICD-10-CM | POA: Diagnosis present

## 2018-06-01 DIAGNOSIS — B182 Chronic viral hepatitis C: Secondary | ICD-10-CM

## 2018-06-01 DIAGNOSIS — R9431 Abnormal electrocardiogram [ECG] [EKG]: Secondary | ICD-10-CM | POA: Diagnosis present

## 2018-06-01 DIAGNOSIS — F1021 Alcohol dependence, in remission: Secondary | ICD-10-CM | POA: Diagnosis present

## 2018-06-01 DIAGNOSIS — K8020 Calculus of gallbladder without cholecystitis without obstruction: Secondary | ICD-10-CM | POA: Diagnosis present

## 2018-06-01 DIAGNOSIS — F17210 Nicotine dependence, cigarettes, uncomplicated: Secondary | ICD-10-CM | POA: Diagnosis present

## 2018-06-01 DIAGNOSIS — C220 Liver cell carcinoma: Secondary | ICD-10-CM | POA: Diagnosis present

## 2018-06-01 DIAGNOSIS — E11649 Type 2 diabetes mellitus with hypoglycemia without coma: Secondary | ICD-10-CM | POA: Diagnosis present

## 2018-06-01 DIAGNOSIS — D72828 Other elevated white blood cell count: Secondary | ICD-10-CM | POA: Diagnosis not present

## 2018-06-01 DIAGNOSIS — K7460 Unspecified cirrhosis of liver: Secondary | ICD-10-CM

## 2018-06-01 DIAGNOSIS — K7031 Alcoholic cirrhosis of liver with ascites: Secondary | ICD-10-CM | POA: Diagnosis not present

## 2018-06-01 DIAGNOSIS — C772 Secondary and unspecified malignant neoplasm of intra-abdominal lymph nodes: Secondary | ICD-10-CM | POA: Diagnosis present

## 2018-06-01 DIAGNOSIS — R1032 Left lower quadrant pain: Secondary | ICD-10-CM | POA: Diagnosis present

## 2018-06-01 DIAGNOSIS — R911 Solitary pulmonary nodule: Secondary | ICD-10-CM

## 2018-06-01 DIAGNOSIS — Z79899 Other long term (current) drug therapy: Secondary | ICD-10-CM | POA: Diagnosis not present

## 2018-06-01 DIAGNOSIS — I8500 Esophageal varices without bleeding: Secondary | ICD-10-CM | POA: Diagnosis present

## 2018-06-01 DIAGNOSIS — Z794 Long term (current) use of insulin: Secondary | ICD-10-CM | POA: Diagnosis not present

## 2018-06-01 DIAGNOSIS — I872 Venous insufficiency (chronic) (peripheral): Secondary | ICD-10-CM | POA: Diagnosis present

## 2018-06-01 DIAGNOSIS — Z8619 Personal history of other infectious and parasitic diseases: Secondary | ICD-10-CM | POA: Diagnosis not present

## 2018-06-01 DIAGNOSIS — N400 Enlarged prostate without lower urinary tract symptoms: Secondary | ICD-10-CM | POA: Diagnosis present

## 2018-06-01 LAB — BODY FLUID CELL COUNT WITH DIFFERENTIAL
Lymphs, Fluid: 8 %
Monocyte-Macrophage-Serous Fluid: 87 % (ref 50–90)
Neutrophil Count, Fluid: 5 % (ref 0–25)
Total Nucleated Cell Count, Fluid: 69 cu mm (ref 0–1000)

## 2018-06-01 LAB — COMPREHENSIVE METABOLIC PANEL
ALT: 22 U/L (ref 0–44)
AST: 50 U/L — ABNORMAL HIGH (ref 15–41)
Albumin: 1.6 g/dL — ABNORMAL LOW (ref 3.5–5.0)
Alkaline Phosphatase: 130 U/L — ABNORMAL HIGH (ref 38–126)
Anion gap: 7 (ref 5–15)
BUN: 11 mg/dL (ref 8–23)
CO2: 27 mmol/L (ref 22–32)
CREATININE: 0.6 mg/dL — AB (ref 0.61–1.24)
Calcium: 8 mg/dL — ABNORMAL LOW (ref 8.9–10.3)
Chloride: 99 mmol/L (ref 98–111)
GFR calc Af Amer: >60 mL/min (ref >60)
GFR calc non Af Amer: >60 mL/min (ref >60)
Glucose, Bld: 44 mg/dL — CL (ref 70–99)
Potassium: 3.9 mmol/L (ref 3.5–5.1)
Sodium: 133 mmol/L — ABNORMAL LOW (ref 135–145)
Total Bilirubin: 0.6 mg/dL (ref 0.3–1.2)
Total Protein: 6.4 g/dL — ABNORMAL LOW (ref 6.5–8.1)

## 2018-06-01 LAB — PROTEIN, PLEURAL OR PERITONEAL FLUID: Total protein, fluid: <3 g/dL

## 2018-06-01 LAB — CBC WITH DIFFERENTIAL/PLATELET
ABS IMMATURE GRANULOCYTES: 0.1 10*3/uL — AB (ref 0.00–0.07)
BASOS PCT: 1 %
Basophils Absolute: 0.1 10*3/uL (ref 0.0–0.1)
Eosinophils Absolute: 0.2 10*3/uL (ref 0.0–0.5)
Eosinophils Relative: 1 %
HCT: 33 % — ABNORMAL LOW (ref 39.0–52.0)
Hemoglobin: 10.9 g/dL — ABNORMAL LOW (ref 13.0–17.0)
Immature Granulocytes: 1 %
Lymphocytes Relative: 22 %
Lymphs Abs: 3.5 10*3/uL (ref 0.7–4.0)
MCH: 30.7 pg (ref 26.0–34.0)
MCHC: 33 g/dL (ref 30.0–36.0)
MCV: 93 fL (ref 80.0–100.0)
Monocytes Absolute: 1.8 10*3/uL — ABNORMAL HIGH (ref 0.1–1.0)
Monocytes Relative: 12 %
Neutro Abs: 10 10*3/uL — ABNORMAL HIGH (ref 1.7–7.7)
Neutrophils Relative %: 63 %
Platelets: 339 10*3/uL (ref 150–400)
RBC: 3.55 MIL/uL — ABNORMAL LOW (ref 4.22–5.81)
RDW: 14.5 % (ref 11.5–15.5)
WBC: 15.6 10*3/uL — ABNORMAL HIGH (ref 4.0–10.5)
nRBC: 0 % (ref 0.0–0.2)

## 2018-06-01 LAB — ALBUMIN, PLEURAL OR PERITONEAL FLUID: Albumin, Fluid: <1 g/dL

## 2018-06-01 LAB — GLUCOSE, CAPILLARY
Glucose-Capillary: 123 mg/dL — ABNORMAL HIGH (ref 70–99)
Glucose-Capillary: 34 mg/dL — CL (ref 70–99)
Glucose-Capillary: 139 mg/dL — ABNORMAL HIGH (ref 70–99)
Glucose-Capillary: 70 mg/dL (ref 70–99)
Glucose-Capillary: 211 mg/dL — ABNORMAL HIGH (ref 70–99)

## 2018-06-01 LAB — HIV ANTIBODY (ROUTINE TESTING W REFLEX): HIV Screen 4th Generation wRfx: NONREACTIVE

## 2018-06-01 MED ORDER — GUAIFENESIN-DM 100-10 MG/5ML PO SYRP
5.0000 mL | ORAL_SOLUTION | ORAL | Status: DC | PRN
  Administered 2018-06-01: 5 mL via ORAL
  Filled 2018-06-01: qty 10

## 2018-06-01 MED ORDER — LIDOCAINE HCL 1 % IJ SOLN
INTRAMUSCULAR | Status: AC
  Filled 2018-06-01: qty 20

## 2018-06-01 MED ORDER — DEXTROSE 50 % IV SOLN
25.0000 g | INTRAVENOUS | Status: AC
  Administered 2018-06-01: 25 g via INTRAVENOUS

## 2018-06-01 NOTE — Progress Notes (Addendum)
[facility] Progress Note  CC:  Cirrhosis, liver lesions  Subjective:  Feels ok.  BS down to 34 early this AM.  MRI abdomen with and without contrast showed the following:  IMPRESSION: Hepatic cirrhosis. Two right hepatic lobe masses are definite hepatocellular carcinoma (LI-RADS 5), and a 3rd hypervascular lesion in the liver dome is probably hepatocellular carcinoma (LIRADS-4).  Mild porta hepatis and retroperitoneal lymphadenopathy, suspicious for metastatic disease.  Mild-to-moderate ascites.  Awaiting paracentesis this afternoon.  Objective:  Vital signs in last 24 hours: Temp:  [97.7 F (36.5 C)-98.8 F (37.1 C)] 98.2 F (36.8 C) (01/23 0550) Pulse Rate:  [91-114] 100 (01/23 0550) Resp:  [14-18] 14 (01/23 0550) BP: (155-194)/(86-112) 157/86 (01/23 0550) SpO2:  [89 %-100 %] 89 % (01/23 0550) Weight:  [89.9 kg] 89.9 kg (01/22 1420) Last BM Date: 05/31/18 General:  Alert, Well-developed, in NAD Heart:  Regular rate and rhythm; no murmurs Pulm:  CTAB.  No increased WOB. Abdomen:  Soft, distended with ascites fluid.  BS present.  Non-tender. Extremities:  2+ pitting edema in B/L LE's with brawny stasis dermatitis skin changes. Neurologic:  Alert and oriented x 4;  grossly normal neurologically. Psych:  Alert and cooperative. Normal mood and affect.  Intake/Output from previous day: 01/22 0701 - 01/23 0700 In: 400 [P.O.:400] Out: -   Lab Results: Recent Labs    05/31/18 0853 06/01/18 0515  WBC 17.2* 15.6*  HGB 10.5* 10.9*  HCT 31.9* 33.0*  PLT 399 339   BMET Recent Labs    05/31/18 0853 06/01/18 0515  NA 131* 133*  K 3.8 3.9  CL 94* 99  CO2 30 27  GLUCOSE 60* 44*  BUN 13 11  CREATININE 0.64 0.60*  CALCIUM 8.2* 8.0*   LFT Recent Labs    06/01/18 0515  PROT 6.4*  ALBUMIN 1.6*  AST 50*  ALT 22  ALKPHOS 130*  BILITOT 0.6   PT/INR Recent Labs    05/31/18 0853  LABPROT 14.0  INR 1.09   Ct Chest Wo  Contrast  Result Date: 05/31/2018 CLINICAL DATA:  63 year old male with cough. Nodule was seen on the CT of the abdomen pelvis. EXAM: CT CHEST WITHOUT CONTRAST TECHNIQUE: Multidetector CT imaging of the chest was performed following the standard protocol without IV contrast. COMPARISON:  CT of the abdomen pelvis dated 05/31/2018 and chest radiograph dated 05/12/2018 FINDINGS: Evaluation of this exam is limited in the absence of intravenous contrast. Cardiovascular: There is no cardiomegaly. There is a small pericardial effusion measuring 8 mm anterior to the heart. There is multi vessel coronary vascular calcification as well as calcification of the mitral annulus. There is mild atherosclerotic calcification of the thoracic aorta. The central pulmonary arteries are grossly unremarkable on this noncontrast CT. Mediastinum/Nodes: There is no hilar or mediastinal adenopathy. The esophagus and the thyroid gland are grossly unremarkable as visualized. No mediastinal fluid collection. Lungs/Pleura: There are biapical subpleural blebs. There is a 4 mm nodule in the right lower lobe (series 5, image 93). There is no focal consolidation, pleural effusion, or pneumothorax. An accessory azygos fissure is noted. The central airways are patent. Upper Abdomen: There is morphologic changes of cirrhosis. There is a partially visualized small ascites and upper abdominal and paraesophageal varices. Musculoskeletal: There is mild diffuse subcutaneous edema. The osseous structures are intact. IMPRESSION: 1. No acute intrathoracic pathology. 2. A 4 mm right lower lobe pulmonary nodule No follow-up needed if patient is low-risk. Non-contrast chest CT can be considered  in 12 months if patient is high-risk. This recommendation follows the consensus statement: Guidelines for Management of Incidental Pulmonary Nodules Detected on CT Images: From the Fleischner Society 2017; Radiology 2017; 284:228-243. 3. Cirrhosis, ascites, and upper  abdominal varices. Electronically Signed   By: Anner Crete M.D.   On: 05/31/2018 19:21   Ct Abdomen Pelvis W Contrast  Result Date: 05/31/2018 CLINICAL DATA:  History of hepatitis C cirrhosis. Nausea. Abdominal pain and distention. Constipation. EXAM: CT ABDOMEN AND PELVIS WITH CONTRAST TECHNIQUE: Multidetector CT imaging of the abdomen and pelvis was performed using the standard protocol following bolus administration of intravenous contrast. CONTRAST:  139m ISOVUE-300 IOPAMIDOL (ISOVUE-300) INJECTION 61% COMPARISON:  01/24/2018 abdominal sonogram. FINDINGS: Lower chest: Mild hypoventilatory changes at the dependent lung bases. Limited visualization of a right lower lobe 4 mm pulmonary nodule (series 8/image 1). Moderate lower esophageal varices. Coronary atherosclerosis. Hepatobiliary: Diffusely irregular liver surface compatible with hepatic cirrhosis. Peripheral segment 8 right liver lobe 2.3 x 2.2 cm mass (series 2/image 24) demonstrates arterial hyperenhancement and portal phase washout. Posterior segment 6 right liver lobe 2.0 x 1.8 cm mass (series 2/image 39) also demonstrates arterial hyperenhancement and portal phase washout. A few scattered coarse subcentimeter right liver lobe granulomatous calcifications. No additional liver lesions. Distended gallbladder (5.8 cm diameter). Mild diffuse gallbladder wall thickening. No radiopaque cholelithiasis. No biliary ductal dilatation. Pancreas: Normal, with no mass or duct dilation. Spleen: Normal size. No mass. Adrenals/Urinary Tract: Normal adrenals. Normal kidneys with no hydronephrosis and no renal mass. Normal bladder. Stomach/Bowel: Normal non-distended stomach. Normal caliber small bowel with no small bowel wall thickening. Normal appendix. Normal large bowel with no diverticulosis, large bowel wall thickening or pericolonic fat stranding. Vascular/Lymphatic: Atherosclerotic nonaneurysmal abdominal aorta. Patent portal, splenic, hepatic and renal  veins. Mildly enlarged 1.2 cm portacaval node (series 4/image 45). Mildly enlarged 1.2 cm left para celiac node (series 4/image 48). Mildly enlarged posterior peripancreatic nodes up to 1.2 cm (series 4/image 54). No pathologically enlarged pelvic nodes. Reproductive: Mildly enlarged prostate with nonspecific internal prostatic calcifications. Other: No pneumoperitoneum. Small to moderate volume ascites. No focal fluid collection. Mild anasarca. Musculoskeletal: No aggressive appearing focal osseous lesions. Mild thoracolumbar spondylosis. IMPRESSION: 1. Hepatic cirrhosis. 2. Two hyperenhancing liver masses (2.3 cm in segment 8 right liver lobe and 2.0 cm in segment 6 right liver lobe) with features highly suspicious for multifocal hepatocellular carcinoma. MRI abdomen without and with IV contrast is indicated for further characterization. 3. Small to moderate volume ascites. Moderate esophageal varices. Mild anasarca. 4. Nonspecific mild diffuse gallbladder wall thickening without radiopaque cholelithiasis, potentially due to noninflammatory edema. 5. Mild upper retroperitoneal adenopathy is nonspecific and could be reactive or metastatic. CT abdomen with IV contrast follow-up recommended in 3 months. 6. Right lower lobe 4 mm solid pulmonary nodule. Dedicated chest CT recommended for further evaluation. 7. Mild prostatomegaly. 8.  Aortic Atherosclerosis (ICD10-I70.0). Electronically Signed   By: JIlona SorrelM.D.   On: 05/31/2018 11:49   Mr Liver W WOHContrast  Result Date: 05/31/2018 CLINICAL DATA:  Cirrhosis. Chronic hepatitis-C. Abdominal pain and distention. Liver masses on recent CT. EXAM: MRI ABDOMEN WITHOUT AND WITH CONTRAST TECHNIQUE: Multiplanar multisequence MR imaging of the abdomen was performed both before and after the administration of intravenous contrast. CONTRAST:  9 mL Gadavist COMPARISON:  CT on 05/31/2018 FINDINGS: Lower chest: No acute findings. Hepatobiliary: Hepatic cirrhosis again  demonstrated. Mild-to-moderate ascites. A 2.2 x 2.0 cm mass with arterial phase enhancement is seen in segment 8 on  image 21/901. Another similar mass is seen in segment 6/7 measuring 3.2 x 3.1 cm on image 32/903. Both of these masses show rapid contrast washout and delayed capsular enhancement, consistent with hepatocellular carcinoma (LI-RADS 5). A 1.8 cm hypervascular lesion is seen in the liver dome the junction of segment 7/8 on image 18/901. This shows arterial phase hyperenhancement with enhancing capsule rim, but no significant washout. (LI-RADS 4). Gallbladder is unremarkable no evidence of biliary ductal dilatation. Pancreas: No mass or inflammatory changes. Spleen:  Within normal limits in size and appearance. Adrenals/Urinary Tract: No masses identified. No evidence of hydronephrosis. Stomach/Bowel: Visualized portion unremarkable. Vascular/Lymphatic: Mild lymphadenopathy is seen in the porta hepatis, with largest lymph node measuring 12 mm. Mild lymphadenopathy in the aortocaval and left paraaortic regions. Largest lymph node in the left paraaortic region measures 1.4 cm. No evidence of abdominal aortic aneurysm. Other:  None. Musculoskeletal:  No suspicious bone lesions identified. IMPRESSION: Hepatic cirrhosis. Two right hepatic lobe masses are definite hepatocellular carcinoma (LI-RADS 5), and a 3rd hypervascular lesion in the liver dome is probably hepatocellular carcinoma (LIRADS-4). Mild porta hepatis and retroperitoneal lymphadenopathy, suspicious for metastatic disease. Mild-to-moderate ascites. Electronically Signed   By: Earle Gell M.D.   On: 05/31/2018 19:19   Assessment / Plan: 1. LLQ abdominal pain most likely due to ascites (R/O SBP), constipation 2. Cirrhosis, history of ETOH abuse (abstinent for 2 years) and Chronic Hepatitis C (treated with Epclusa 01/2018) 3. Liver masses per CT: Two lesions definite HCC by MRI with a third suspicious lesion as well as RP lymph nodes. 4.  Ascites. 5. Leukocytosis, rule out SBP, unlikely residual effects from Prednisone taper prescribed 05/12/2018-05/18/2018. 6. Normocytic anemia likely secondary to cirrhosis:  Hgb stable. 7. Esophageal Varices per abd/pelvic CT 8. Hyponatremia secondary to liver disease:  Improved this AM. 9. Pulmonary nodules  -Await results of paracentesis and fluid studies to rule out SBP. -Dr. Silverio Decamp to re-discuss with IR.  ? If RP nodes need/can be biopsied.  This will all affect his treatment, whether he is a candidate for chemoembolization, etc. -Follow-up AFP, HIV, Hep C RNA labs. -Will need EGD at some point as well to evaluate varices seen on CT scan. -May need to consider initiating diuretics upon discharge as well.   LOS: 0 days   Laban Emperor. Zehr  06/01/2018, 9:54 AM    Attending physician's note   I have taken an interval history, reviewed the chart and examined the patient. I agree with the Advanced Practitioner's note, impression and recommendations.   EtOH and hep C cirrhosis, meld score 7, child Pugh classification B New onset ascites and liver lesions concerning for [facility] Await diagnostic paracentesis to exclude SBP  Possible third hypervascular lesion in liver dome in addition to 2 right hepatic lobe lesions.  Also has retroperitoneal lymphadenopathy.  If retroperitoneal lymphadenopathy is metastatic disease, not a candidate for local regional therapy/chemoembolization. Will discuss with IR   K. Denzil Magnuson , MD [phone]

## 2018-06-01 NOTE — Progress Notes (Signed)
PROGRESS NOTE  [name] FYB:[id number] DOB: Jul 04, 1955 DOA: 05/31/2018 PCP: Marliss Coots, NP   LOS: 0 days   Brief Narrative / Interim history: 63 year old male with history of alcoholic liver cirrhosis, anxiety, depression, chronic hyponatremia, DM 2, hepatitis C treated with Epclusa, diabetic peripheral neuropathy, substance use disorder, thrombocytopenia and tobacco use admitted with abdominal pain and generalized edema.  Had mild leukocytosis to 17 and low albumin to 1.6.  CT abdomen with ascites, mild anasarca, cirrhosis, liver mass measuring 2.3 cm and 2.0 cm and retro-peritoneal adenopathy.  MRI was recommended and showed hepatic cirrhosis and 2 right hepatic lobe masses consistent with hepatocellular carcinoma and third hypervascular lesion concerning. GI consulted and following.  Of note, patient never had colonoscopy.   Subjective: Hypoglycemic to 34 after n.p.o. for paracentesis this morning.  Patient was fed.  Hypoglycemia resolved.  Paracentesis pushed back to this afternoon.  Otherwise reports feeling well.  No significant abdominal pain.  Denies dyspnea or chest pain.  Assessment & Plan: Principal Problem:   Liver mass Active Problems:   Chronic hyponatremia   Neuropathy   Hypomagnesemia   Anemia   Hypoglycemia   Type 2 diabetes mellitus (HCC)   Leukocytosis   Tobacco use   Lung nodule < 6cm on CT  Cirrhosis/ascites/abdominal pain: Improved but will rule out SBP given retroperitoneal adenopathy could also be metastasis from his liver cancer. -Paracentesis this afternoon -We will initiate appropriate diuretics after paracentesis.  Hepatocellular carcinoma of liver: Noted on CT abdomen and MRI.  Also retroperitoneal lymphadenopathy consistent for metastatic disease.  Will exclude possible SBP and reactive lymphadenopathy.  -GI consulting with IR for possible CT-guided biopsy.  Normocytic anemia: Likely due to his underlying liver disease. -We will monitor  CBC given history of esophageal varices.  Hyponatremia: Likely due to ascites.  Hyperglycemia/diabetes-2: Patient was n.p.o. for possible paracentesis this morning.  Hypoglycemic to 34.  Repeat CBG in 120s. -CBG monitoring -Sliding scale insulin -Continue home gabapentin  Lung nodules: Noted on CT abdomen -Repeat CT warranted in 12 months.   Scheduled Meds: . gabapentin  1,600 mg Oral TID  . insulin aspart  0-15 Units Subcutaneous TID WC  . insulin aspart  0-5 Units Subcutaneous QHS  . insulin glargine  35 Units Subcutaneous QHS  . lidocaine      . lidocaine       Continuous Infusions: PRN Meds:.guaiFENesin-dextromethorphan, labetalol, ondansetron **OR** ondansetron (ZOFRAN) IV  DVT prophylaxis: SCD Code Status: Full code Family Communication: No family member at bedside Disposition Plan: Change to inpatient.  Consultants:   Gastroenterology  IR  Procedures:   Paracentesis planned for this afternoon  Antimicrobials:  None  Objective: Vitals:   05/31/18 1420 05/31/18 2123 06/01/18 0550 06/01/18 1403  BP: (!) 160/92 (!) 155/87 (!) 157/86 (!) 151/75  Pulse: 91 (!) 102 100 91  Resp: _0 Temp: 97.7 F (36.5 C) 98.8 F (37.1 C) 98.2 F (36.8 C) 98.4 F (36.9 C)  TempSrc: Oral Oral Oral Oral  SpO2: 100% 95% (!) 89%   Weight: 89.9 kg     Height: 6' (1.829 m)       Intake/Output Summary (Last 24 hours) at 06/01/2018 1456 Last data filed at 06/01/2018 0940 Gross per 24 hour  Intake 400 ml  Output 575 ml  Net -175 ml   Filed Weights   05/31/18 0819 05/31/18 1420  Weight: 87.5 kg 89.9 kg    Examination:  GENERAL: Appears well. No acute distress.  HEENT: MMM.  Vision and Hearing grossly intact.  NECK: Supple.  No JVD.  LUNGS:  No IWOB. Good air movement. CTAB.  HEART:  RRR. Heart sounds normal. ABD: Bowel sounds present.  Somewhat distended.  Mild diffuse tenderness. MSK/EXT: Trace pitting edema bilaterally SKIN: no apparent skin lesion.    NEURO: Awake, alert and oriented appropriately.  No gross deficit.  PSYCH: Calm. Normal affect.  Data Reviewed: I have independently reviewed following labs and imaging studies   CBC: Recent Labs  Lab 05/31/18 0853 06/01/18 0515  WBC 17.2* 15.6*  NEUTROABS 13.7* 10.0*  HGB 10.5* 10.9*  HCT 31.9* 33.0*  MCV 94.4 93.0  PLT 399 517   Basic Metabolic Panel: Recent Labs  Lab 05/31/18 0853 06/01/18 0515  NA 131* 133*  K 3.8 3.9  CL 94* 99  CO2 30 27  GLUCOSE 60* 44*  BUN 13 11  CREATININE 0.64 0.60*  CALCIUM 8.2* 8.0*  MG 1.6*  --   PHOS 3.0  --    GFR: Estimated Creatinine Clearance: 106.4 mL/min (A) (by C-G formula based on SCr of 0.6 mg/dL (L)). Liver Function Tests: Recent Labs  Lab 05/31/18 0853 06/01/18 0515  AST 43* 50*  ALT 22 22  ALKPHOS 127* 130*  BILITOT 0.8 0.6  PROT 6.8 6.4*  ALBUMIN 1.7* 1.6*   Recent Labs  Lab 05/31/18 0853  LIPASE 28   No results for input(s): AMMONIA in the last 168 hours. Coagulation Profile: Recent Labs  Lab 05/31/18 0853  INR 1.09   Cardiac Enzymes: No results for input(s): CKTOTAL, CKMB, CKMBINDEX, TROPONINI in the last 168 hours. BNP (last 3 results) No results for input(s): PROBNP in the last 8760 hours. HbA1C: No results for input(s): HGBA1C in the last 72 hours. CBG: Recent Labs  Lab 05/31/18 1029 05/31/18 1625 06/01/18 0645 06/01/18 0712 06/01/18 1146  GLUCAP 127* 148* 34* 123* 139*   Lipid Profile: No results for input(s): CHOL, HDL, LDLCALC, TRIG, CHOLHDL, LDLDIRECT in the last 72 hours. Thyroid Function Tests: No results for input(s): TSH, T4TOTAL, FREET4, T3FREE, THYROIDAB in the last 72 hours. Anemia Panel: No results for input(s): VITAMINB12, FOLATE, FERRITIN, TIBC, IRON, RETICCTPCT in the last 72 hours. Urine analysis:    Component Value Date/Time   COLORURINE YELLOW 05/31/2018 0853   APPEARANCEUR HAZY (A) 05/31/2018 0853   APPEARANCEUR Clear 10/13/2012 2130   LABSPEC 1.015 05/31/2018  0853   LABSPEC 1.002 10/13/2012 2130   PHURINE 6.0 05/31/2018 0853   GLUCOSEU NEGATIVE 05/31/2018 0853   GLUCOSEU >=500 10/13/2012 2130   HGBUR MODERATE (A) 05/31/2018 0853   BILIRUBINUR NEGATIVE 05/31/2018 0853   BILIRUBINUR Negative 10/13/2012 2130   KETONESUR NEGATIVE 05/31/2018 0853   PROTEINUR >=300 (A) 05/31/2018 0853   UROBILINOGEN 0.2 11/06/2014 1910   NITRITE NEGATIVE 05/31/2018 0853   LEUKOCYTESUR NEGATIVE 05/31/2018 0853   LEUKOCYTESUR Negative 10/13/2012 2130   Sepsis Labs: Invalid input(s): PROCALCITONIN, LACTICIDVEN  No results found for this or any previous visit (from the past 240 hour(s)).    Radiology Studies: Ct Chest Wo Contrast  Result Date: 05/31/2018 CLINICAL DATA:  63 year old male with cough. Nodule was seen on the CT of the abdomen pelvis. EXAM: CT CHEST WITHOUT CONTRAST TECHNIQUE: Multidetector CT imaging of the chest was performed following the standard protocol without IV contrast. COMPARISON:  CT of the abdomen pelvis dated 05/31/2018 and chest radiograph dated 05/12/2018 FINDINGS: Evaluation of this exam is limited in the absence of intravenous contrast. Cardiovascular: There is no cardiomegaly. There is  a small pericardial effusion measuring 8 mm anterior to the heart. There is multi vessel coronary vascular calcification as well as calcification of the mitral annulus. There is mild atherosclerotic calcification of the thoracic aorta. The central pulmonary arteries are grossly unremarkable on this noncontrast CT. Mediastinum/Nodes: There is no hilar or mediastinal adenopathy. The esophagus and the thyroid gland are grossly unremarkable as visualized. No mediastinal fluid collection. Lungs/Pleura: There are biapical subpleural blebs. There is a 4 mm nodule in the right lower lobe (series 5, image 93). There is no focal consolidation, pleural effusion, or pneumothorax. An accessory azygos fissure is noted. The central airways are patent. Upper Abdomen: There is  morphologic changes of cirrhosis. There is a partially visualized small ascites and upper abdominal and paraesophageal varices. Musculoskeletal: There is mild diffuse subcutaneous edema. The osseous structures are intact. IMPRESSION: 1. No acute intrathoracic pathology. 2. A 4 mm right lower lobe pulmonary nodule No follow-up needed if patient is low-risk. Non-contrast chest CT can be considered in 12 months if patient is high-risk. This recommendation follows the consensus statement: Guidelines for Management of Incidental Pulmonary Nodules Detected on CT Images: From the Fleischner Society 2017; Radiology 2017; 284:228-243. 3. Cirrhosis, ascites, and upper abdominal varices. Electronically Signed   By: Anner Crete M.D.   On: 05/31/2018 19:21   Mr Liver W KD Contrast  Result Date: 05/31/2018 CLINICAL DATA:  Cirrhosis. Chronic hepatitis-C. Abdominal pain and distention. Liver masses on recent CT. EXAM: MRI ABDOMEN WITHOUT AND WITH CONTRAST TECHNIQUE: Multiplanar multisequence MR imaging of the abdomen was performed both before and after the administration of intravenous contrast. CONTRAST:  9 mL Gadavist COMPARISON:  CT on 05/31/2018 FINDINGS: Lower chest: No acute findings. Hepatobiliary: Hepatic cirrhosis again demonstrated. Mild-to-moderate ascites. A 2.2 x 2.0 cm mass with arterial phase enhancement is seen in segment 8 on image 21/901. Another similar mass is seen in segment 6/7 measuring 3.2 x 3.1 cm on image 32/903. Both of these masses show rapid contrast washout and delayed capsular enhancement, consistent with hepatocellular carcinoma (LI-RADS 5). A 1.8 cm hypervascular lesion is seen in the liver dome the junction of segment 7/8 on image 18/901. This shows arterial phase hyperenhancement with enhancing capsule rim, but no significant washout. (LI-RADS 4). Gallbladder is unremarkable no evidence of biliary ductal dilatation. Pancreas: No mass or inflammatory changes. Spleen:  Within normal limits  in size and appearance. Adrenals/Urinary Tract: No masses identified. No evidence of hydronephrosis. Stomach/Bowel: Visualized portion unremarkable. Vascular/Lymphatic: Mild lymphadenopathy is seen in the porta hepatis, with largest lymph node measuring 12 mm. Mild lymphadenopathy in the aortocaval and left paraaortic regions. Largest lymph node in the left paraaortic region measures 1.4 cm. No evidence of abdominal aortic aneurysm. Other:  None. Musculoskeletal:  No suspicious bone lesions identified. IMPRESSION: Hepatic cirrhosis. Two right hepatic lobe masses are definite hepatocellular carcinoma (LI-RADS 5), and a 3rd hypervascular lesion in the liver dome is probably hepatocellular carcinoma (LIRADS-4). Mild porta hepatis and retroperitoneal lymphadenopathy, suspicious for metastatic disease. Mild-to-moderate ascites. Electronically Signed   By: Earle Gell M.D.   On: 05/31/2018 19:19      [facility] Pager [phone]  If 7PM-7AM, please contact night-coverage [email or web address] Password [facility] 06/01/2018, 2:56 PM

## 2018-06-01 NOTE — Progress Notes (Signed)
Hypoglycemic Event  CBG: 34  Treatment: D50 50 mL (25 gm)  Symptoms: Nervous/irritable  Follow-up CBG: Time:0712 CBG Result:123  Possible Reasons for Event: Inadequate meal intake  Comments/MD notified: Danne Baxter

## 2018-06-01 NOTE — Procedures (Signed)
PROCEDURE SUMMARY:  Successful image-guided paracentesis from the right lower abdomen.  Yielded 700 milliliters of clear light yellow fluid.  No immediate complications.  Patient tolerated well.   Specimen was sent for labs.  Claris Pong Louk PA-C 06/01/2018 4:02 PM

## 2018-06-02 ENCOUNTER — Other Ambulatory Visit: Payer: Self-pay | Admitting: Physician Assistant

## 2018-06-02 DIAGNOSIS — K7689 Other specified diseases of liver: Secondary | ICD-10-CM

## 2018-06-02 DIAGNOSIS — K7031 Alcoholic cirrhosis of liver with ascites: Secondary | ICD-10-CM

## 2018-06-02 LAB — CBC WITH DIFFERENTIAL/PLATELET
Abs Immature Granulocytes: 0.09 10*3/uL — ABNORMAL HIGH (ref 0.00–0.07)
BASOS ABS: 0.1 10*3/uL (ref 0.0–0.1)
Basophils Relative: 1 %
Eosinophils Absolute: 0.2 10*3/uL (ref 0.0–0.5)
Eosinophils Relative: 2 %
HCT: 29.1 % — ABNORMAL LOW (ref 39.0–52.0)
Hemoglobin: 9.6 g/dL — ABNORMAL LOW (ref 13.0–17.0)
IMMATURE GRANULOCYTES: 1 %
LYMPHS PCT: 26 %
Lymphs Abs: 3.2 10*3/uL (ref 0.7–4.0)
MCH: 31.7 pg (ref 26.0–34.0)
MCHC: 33 g/dL (ref 30.0–36.0)
MCV: 96 fL (ref 80.0–100.0)
Monocytes Absolute: 1.7 10*3/uL — ABNORMAL HIGH (ref 0.1–1.0)
Monocytes Relative: 14 %
Neutro Abs: 7.1 10*3/uL (ref 1.7–7.7)
Neutrophils Relative %: 56 %
Platelets: 316 10*3/uL (ref 150–400)
RBC: 3.03 MIL/uL — ABNORMAL LOW (ref 4.22–5.81)
RDW: 14.8 % (ref 11.5–15.5)
WBC: 12.4 10*3/uL — ABNORMAL HIGH (ref 4.0–10.5)
nRBC: 0 % (ref 0.0–0.2)

## 2018-06-02 LAB — BASIC METABOLIC PANEL
Anion gap: 6 (ref 5–15)
BUN: 12 mg/dL (ref 8–23)
CO2: 29 mmol/L (ref 22–32)
Calcium: 7.6 mg/dL — ABNORMAL LOW (ref 8.9–10.3)
Chloride: 98 mmol/L (ref 98–111)
Creatinine, Ser: 0.54 mg/dL — ABNORMAL LOW (ref 0.61–1.24)
GFR calc Af Amer: >60 mL/min (ref >60)
GFR calc non Af Amer: >60 mL/min (ref >60)
Glucose, Bld: 97 mg/dL (ref 70–99)
Potassium: 4.1 mmol/L (ref 3.5–5.1)
Sodium: 133 mmol/L — ABNORMAL LOW (ref 135–145)

## 2018-06-02 LAB — CEA: CEA: 8.7 ng/mL — ABNORMAL HIGH (ref 0.0–4.7)

## 2018-06-02 LAB — GLUCOSE, CAPILLARY
GLUCOSE-CAPILLARY: 96 mg/dL (ref 70–99)
Glucose-Capillary: 51 mg/dL — ABNORMAL LOW (ref 70–99)
Glucose-Capillary: 160 mg/dL — ABNORMAL HIGH (ref 70–99)
Glucose-Capillary: 171 mg/dL — ABNORMAL HIGH (ref 70–99)
Glucose-Capillary: 147 mg/dL — ABNORMAL HIGH (ref 70–99)

## 2018-06-02 LAB — AFP TUMOR MARKER: AFP, Serum, Tumor Marker: 4.2 ng/mL (ref 0.0–8.3)

## 2018-06-02 LAB — HCV RNA QUANT: HCV QUANT: NOT DETECTED [IU]/mL (ref >50)

## 2018-06-02 MED ORDER — INSULIN GLARGINE 100 UNIT/ML ~~LOC~~ SOLN: 30.0000 [IU] | Freq: Every day | SUBCUTANEOUS | 0 refills | Status: DC

## 2018-06-02 MED ORDER — INSULIN GLARGINE 100 UNIT/ML ~~LOC~~ SOLN: 25.0000 [IU] | Freq: Every day | SUBCUTANEOUS | Status: DC

## 2018-06-02 MED ORDER — GABAPENTIN 400 MG PO CAPS: 1200.0000 mg | ORAL_CAPSULE | Freq: Three times a day (TID) | ORAL | 0 refills | Status: DC

## 2018-06-02 MED ORDER — FUROSEMIDE 20 MG PO TABS: 20.0000 mg | ORAL_TABLET | Freq: Every day | ORAL | Status: DC

## 2018-06-02 MED ORDER — SPIRONOLACTONE 25 MG PO TABS
50.0000 mg | ORAL_TABLET | Freq: Every day | ORAL | Status: DC
  Administered 2018-06-02: 50 mg via ORAL
  Filled 2018-06-02: qty 2

## 2018-06-02 MED ORDER — SPIRONOLACTONE 50 MG PO TABS: 50.0000 mg | ORAL_TABLET | Freq: Every day | ORAL | 0 refills | Status: DC

## 2018-06-02 MED ORDER — NADOLOL 20 MG PO TABS
20.0000 mg | ORAL_TABLET | Freq: Every day | ORAL | Status: DC
  Administered 2018-06-02: 20 mg via ORAL
  Filled 2018-06-02: qty 1

## 2018-06-02 MED ORDER — SPIRONOLACTONE 25 MG PO TABS: 50.0000 mg | ORAL_TABLET | Freq: Every day | ORAL | Status: DC

## 2018-06-02 MED ORDER — FUROSEMIDE 20 MG PO TABS
20.0000 mg | ORAL_TABLET | Freq: Every day | ORAL | Status: DC
  Administered 2018-06-02: 20 mg via ORAL
  Filled 2018-06-02: qty 1

## 2018-06-02 NOTE — Progress Notes (Signed)
Patient came to nurses station, asked if he needed help says he needs a note from the doctor. MD notified and note obtained and printed and given to patient. When note given to pt offered to wheel him out but patient said he has talked to his primary nurse to see if he could get a taxi voucher, this RN contacted the SW in ED. SW unable to obtain taxi voucher, came to bedside to talk to the patient and explain bus route and how to get to where he needed to be even brought a coat for him. Pt refused coat and verbalized understanding of bus route and option. This RN overheard the conversation and d/t rain and temperature called security to see if they could transport the patient to the bus stop in the security jeep. Security was happy to do so and arranged pick up time for 1915. When Probation officer informed pt of the plan he said "i'm not going out there now and waiting that long." Writer called security back and changed pick up time to 1930 for the bus arrival around 1945 to give a cushion so he did not miss the bus. After updating the patient of the new pick up time pt stated "y'all aren't doing me any favors the bus stop is only a 82mn walk and I will just leave now and walk there."  Pt walked out of the room around [place] got on the elevator and left the floor.

## 2018-06-02 NOTE — Discharge Summary (Signed)
Physician Discharge Summary  [name] WUJ:[id number] DOB: 27-Aug-1955 DOA: 05/31/2018  PCP: Marliss Coots, NP  Admit date: 05/31/2018 Discharge date: 06/02/2018  Admitted From: Home Disposition: Home  Recommendations for Outpatient Follow-up:  1. Follow up with PCP in 3 to 4 days-adjust insulin as appropriate 2. Follow-up with oncology on 06/12/2018 3. Follow-up with gastroenterology on 06/15/2018 and on 06/30/2018 4. Please obtain BMP/CBC at follow-up  Home Health: None Equipment/Devices: None  Discharge Condition: Stable CODE STATUS: Full code Diet recommendation: Low-salt and carb modified diet  Hospital Course: 63 year old male with history of alcoholic liver cirrhosis, anxiety, depression, chronic hyponatremia, DM 2, hepatitis C treated with Epclusa, diabetic peripheral neuropathy, substance use disorder, thrombocytopenia and tobacco use admitted with abdominal pain and generalized edema.  Had mild leukocytosis to 17 and low albumin to 1.6.  CT abdomen with ascites, mild anasarca, cirrhosis, liver mass measuring 2.3 cm and 2.0 cm and retro-peritoneal adenopathy.  MRI was recommended and showed hepatic cirrhosis and 2 right hepatic lobe masses consistent with hepatocellular carcinoma and third hypervascular lesion concerning.  Patient had a paracentesis with an overall of 700 cc fluid.  Cytology not consistent with SBP.  Peritoneal fluid culture negative at 24-hour.  Already on Lasix 40 mg daily.  Aldactone 50 mg added at discharge.  Gastrology consulted and evaluated patient.  Per GI, IR did not feel the retroperitoneal adenopathy are metastatic lesion.  GI to see patient outpatient on 06/15/2018 and 2/21.  Oncology follow-up scheduled for 06/12/2018.  See individual problem list below for more.  Discharge Diagnoses:  Principal Problem:   Liver mass Active Problems:   Chronic hyponatremia   Neuropathy   Hypomagnesemia   Anemia   Hypoglycemia   Type 2 diabetes mellitus  (HCC)   Leukocytosis   Tobacco use   Lung nodule < 6cm on CT  Cirrhosis/ascites/abdominal pain: Improved significantly after paracentesis.  Fluid cytology not consistent with SBP.  Culture negative at 24. -Discharged on Lasix 40 mg daily and Aldactone 50 mg daily. -Recommend checking BMP at follow-up in 4 days -May increase Aldactone to 100 mg daily if he tolerates.  Liver masses: Noted on CT abdomen and MRI.  Concern for hepatocellular carcinoma. Also retroperitoneal lymphadenopathy concerning for metastatic disease .  -GI consulted IR for possible CT-guided biopsy.  Per GI, IR did not feel the retroperitoneal lymphadenopathy or metastatic lesion. -Patient to follow-up with GI and oncology as above  Normocytic anemia: Likely due to his underlying liver disease. -We will monitor CBC given history of esophageal varices.  Hyponatremia: Likely due to ascites.  Poorly controlled diabetes -2 with recurrent hypoglycemia: Patient had hypoglycemic events in the morning -Reduce his Lantus from 45 to 25 units.  Change to scheduled from night to morning -We will continue metformin and sliding scale insulin -Recommend keeping CBG log  -Follow-up with PCP in 3 to 4 days -Adjust diabetic meds as appropriate at follow-up  Lung nodules: Noted on CT abdomen -Repeat CT warranted in 12 months.  Discharge Instructions  Discharge Instructions    Call MD for:  difficulty breathing, headache or visual disturbances   Complete by:  As directed    Call MD for:  extreme fatigue   Complete by:  As directed    Call MD for:  persistant dizziness or light-headedness   Complete by:  As directed    Call MD for:  persistant nausea and vomiting   Complete by:  As directed    Call MD for:  redness, tenderness, or signs of infection (pain, swelling, redness, odor or green/yellow discharge around incision site)   Complete by:  As directed    Call MD for:  temperature >100.4   Complete by:  As directed     Diet - low sodium heart healthy   Complete by:  As directed    Diet Carb Modified   Complete by:  As directed    Discharge instructions   Complete by:  As directed    It has been a pleasure taking care of you! You were admitted due to abdominal pain and swelling, which was likely due to liver cirrhosis.  We have taken  some fluid off your belly which helped with you symptoms.  We are discharging you on additional fluid medication to prevent further fluid buildup in your belly.   The MRI of your lower abdomen showed some findings concerning for malignancy.  Our gastroenterologist and oncologist group will set up a follow-up on this for you.   In regards to your diabetes, we reduced your Lantus to 30 units in the morning.  We also recommend checking your blood glucose in the morning and before supper.  Write down the numbers and take it to your primary care doctor at your next follow-up next week.    Please read the directions are your medication.   Increase activity slowly   Complete by:  As directed      Allergies as of 06/02/2018      Reactions   Benadryl [diphenhydramine Hcl] Other (See Comments)   "Causes nervousness"   Benadryl [diphenhydramine] Other (See Comments)   "makes my skin crawl"   Sulfa Antibiotics Other (See Comments)   Childhood allergy   Tegretol [carbamazepine] Other (See Comments)   "almost killed me"   Librium [chlordiazepoxide Hcl] Anxiety   Librium [chlordiazepoxide] Anxiety   Sulfa Antibiotics Rash      Medication List    STOP taking these medications   predniSONE 10 MG (21) Tbpk tablet Commonly known as:  STERAPRED UNI-PAK 21 TAB     TAKE these medications   albuterol 108 (90 Base) MCG/ACT inhaler Commonly known as:  PROVENTIL HFA;VENTOLIN HFA Inhale 2 puffs into the lungs every 6 (six) hours as needed for wheezing or shortness of breath.   FLEXERIL PO Take by mouth.   furosemide 40 MG tablet Commonly known as:  LASIX Take 1 tablet (40 mg  total) by mouth daily.   gabapentin 400 MG capsule Commonly known as:  NEURONTIN Take 3 capsules (1,200 mg total) by mouth 3 (three) times daily for 7 days. What changed:  how much to take   insulin glargine 100 UNIT/ML injection Commonly known as:  LANTUS Inject 0.3 mLs (30 Units total) into the skin daily. What changed:  when to take this   Medical Compression Socks Misc Compression stocking daily   metFORMIN 1000 MG tablet Commonly known as:  GLUCOPHAGE Take 1 tablet (1,000 mg total) by mouth 2 (two) times daily with a meal.   Sofosbuvir-Velpatasvir 400-100 MG Tabs Commonly known as:  EPCLUSA Take 1 tablet by mouth daily.   spironolactone 50 MG tablet Commonly known as:  ALDACTONE Take 1 tablet (50 mg total) by mouth daily. Start taking on:  June 03, 2018      Follow-up Information    Placey, Audrea Muscat, NP. Schedule an appointment as soon as possible for a visit in 4 day(s).   Contact information: [place] [address] [phone]  [facility]. Schedule an appointment as soon as possible for a visit in 1 week(s).   Contact information: [place] [phone] [place] [phone]          Consultations:   Procedures/Studies:  2D echo none  Dg Chest 2 View  Result Date: 05/12/2018 CLINICAL DATA:  Per pt: woke up two night in a row choking from mucus in throat, low grade fever, head and chest congestion. History of acute Asthma, continuous Bronchitis. No history of cardiac disease. HBP controlled with medication. Patient is a diabetic. Smoker of a ppd of cigarettes. EXAM: CHEST - 2 VIEW COMPARISON:  11/06/2014 FINDINGS: Cardiac silhouette is normal in size and configuration. No mediastinal or hilar masses. There is no evidence of adenopathy. There are prominent bronchovascular markings bilaterally. Lungs otherwise clear. No pleural effusion or pneumothorax. Skeletal structures  are intact. IMPRESSION: No active cardiopulmonary disease. Electronically Signed   By: Lajean Manes M.D.   On: 05/12/2018 13:36   Ct Chest Wo Contrast  Result Date: 05/31/2018 CLINICAL DATA:  63 year old male with cough. Nodule was seen on the CT of the abdomen pelvis. EXAM: CT CHEST WITHOUT CONTRAST TECHNIQUE: Multidetector CT imaging of the chest was performed following the standard protocol without IV contrast. COMPARISON:  CT of the abdomen pelvis dated 05/31/2018 and chest radiograph dated 05/12/2018 FINDINGS: Evaluation of this exam is limited in the absence of intravenous contrast. Cardiovascular: There is no cardiomegaly. There is a small pericardial effusion measuring 8 mm anterior to the heart. There is multi vessel coronary vascular calcification as well as calcification of the mitral annulus. There is mild atherosclerotic calcification of the thoracic aorta. The central pulmonary arteries are grossly unremarkable on this noncontrast CT. Mediastinum/Nodes: There is no hilar or mediastinal adenopathy. The esophagus and the thyroid gland are grossly unremarkable as visualized. No mediastinal fluid collection. Lungs/Pleura: There are biapical subpleural blebs. There is a 4 mm nodule in the right lower lobe (series 5, image 93). There is no focal consolidation, pleural effusion, or pneumothorax. An accessory azygos fissure is noted. The central airways are patent. Upper Abdomen: There is morphologic changes of cirrhosis. There is a partially visualized small ascites and upper abdominal and paraesophageal varices. Musculoskeletal: There is mild diffuse subcutaneous edema. The osseous structures are intact. IMPRESSION: 1. No acute intrathoracic pathology. 2. A 4 mm right lower lobe pulmonary nodule No follow-up needed if patient is low-risk. Non-contrast chest CT can be considered in 12 months if patient is high-risk. This recommendation follows the consensus statement: Guidelines for Management of  Incidental Pulmonary Nodules Detected on CT Images: From the Fleischner Society 2017; Radiology 2017; 284:228-243. 3. Cirrhosis, ascites, and upper abdominal varices. Electronically Signed   By: Anner Crete M.D.   On: 05/31/2018 19:21   Ct Abdomen Pelvis W Contrast  Result Date: 05/31/2018 CLINICAL DATA:  History of hepatitis C cirrhosis. Nausea. Abdominal pain and distention. Constipation. EXAM: CT ABDOMEN AND PELVIS WITH CONTRAST TECHNIQUE: Multidetector CT imaging of the abdomen and pelvis was performed using the standard protocol following bolus administration of intravenous contrast. CONTRAST:  167m ISOVUE-300 IOPAMIDOL (ISOVUE-300) INJECTION 61% COMPARISON:  01/24/2018 abdominal sonogram. FINDINGS: Lower chest: Mild hypoventilatory changes at the dependent lung bases. Limited visualization of a right lower lobe 4 mm pulmonary nodule (series 8/image 1). Moderate lower esophageal varices. Coronary atherosclerosis. Hepatobiliary: Diffusely irregular liver surface compatible with hepatic cirrhosis. Peripheral segment 8 right liver lobe 2.3 x 2.2 cm mass (series 2/image 24)  demonstrates arterial hyperenhancement and portal phase washout. Posterior segment 6 right liver lobe 2.0 x 1.8 cm mass (series 2/image 39) also demonstrates arterial hyperenhancement and portal phase washout. A few scattered coarse subcentimeter right liver lobe granulomatous calcifications. No additional liver lesions. Distended gallbladder (5.8 cm diameter). Mild diffuse gallbladder wall thickening. No radiopaque cholelithiasis. No biliary ductal dilatation. Pancreas: Normal, with no mass or duct dilation. Spleen: Normal size. No mass. Adrenals/Urinary Tract: Normal adrenals. Normal kidneys with no hydronephrosis and no renal mass. Normal bladder. Stomach/Bowel: Normal non-distended stomach. Normal caliber small bowel with no small bowel wall thickening. Normal appendix. Normal large bowel with no diverticulosis, large bowel wall  thickening or pericolonic fat stranding. Vascular/Lymphatic: Atherosclerotic nonaneurysmal abdominal aorta. Patent portal, splenic, hepatic and renal veins. Mildly enlarged 1.2 cm portacaval node (series 4/image 45). Mildly enlarged 1.2 cm left para celiac node (series 4/image 48). Mildly enlarged posterior peripancreatic nodes up to 1.2 cm (series 4/image 54). No pathologically enlarged pelvic nodes. Reproductive: Mildly enlarged prostate with nonspecific internal prostatic calcifications. Other: No pneumoperitoneum. Small to moderate volume ascites. No focal fluid collection. Mild anasarca. Musculoskeletal: No aggressive appearing focal osseous lesions. Mild thoracolumbar spondylosis. IMPRESSION: 1. Hepatic cirrhosis. 2. Two hyperenhancing liver masses (2.3 cm in segment 8 right liver lobe and 2.0 cm in segment 6 right liver lobe) with features highly suspicious for multifocal hepatocellular carcinoma. MRI abdomen without and with IV contrast is indicated for further characterization. 3. Small to moderate volume ascites. Moderate esophageal varices. Mild anasarca. 4. Nonspecific mild diffuse gallbladder wall thickening without radiopaque cholelithiasis, potentially due to noninflammatory edema. 5. Mild upper retroperitoneal adenopathy is nonspecific and could be reactive or metastatic. CT abdomen with IV contrast follow-up recommended in 3 months. 6. Right lower lobe 4 mm solid pulmonary nodule. Dedicated chest CT recommended for further evaluation. 7. Mild prostatomegaly. 8.  Aortic Atherosclerosis (ICD10-I70.0). Electronically Signed   By: Ilona Sorrel M.D.   On: 05/31/2018 11:49   Mr Liver W WT Contrast  Result Date: 05/31/2018 CLINICAL DATA:  Cirrhosis. Chronic hepatitis-C. Abdominal pain and distention. Liver masses on recent CT. EXAM: MRI ABDOMEN WITHOUT AND WITH CONTRAST TECHNIQUE: Multiplanar multisequence MR imaging of the abdomen was performed both before and after the administration of intravenous  contrast. CONTRAST:  9 mL Gadavist COMPARISON:  CT on 05/31/2018 FINDINGS: Lower chest: No acute findings. Hepatobiliary: Hepatic cirrhosis again demonstrated. Mild-to-moderate ascites. A 2.2 x 2.0 cm mass with arterial phase enhancement is seen in segment 8 on image 21/901. Another similar mass is seen in segment 6/7 measuring 3.2 x 3.1 cm on image 32/903. Both of these masses show rapid contrast washout and delayed capsular enhancement, consistent with hepatocellular carcinoma (LI-RADS 5). A 1.8 cm hypervascular lesion is seen in the liver dome the junction of segment 7/8 on image 18/901. This shows arterial phase hyperenhancement with enhancing capsule rim, but no significant washout. (LI-RADS 4). Gallbladder is unremarkable no evidence of biliary ductal dilatation. Pancreas: No mass or inflammatory changes. Spleen:  Within normal limits in size and appearance. Adrenals/Urinary Tract: No masses identified. No evidence of hydronephrosis. Stomach/Bowel: Visualized portion unremarkable. Vascular/Lymphatic: Mild lymphadenopathy is seen in the porta hepatis, with largest lymph node measuring 12 mm. Mild lymphadenopathy in the aortocaval and left paraaortic regions. Largest lymph node in the left paraaortic region measures 1.4 cm. No evidence of abdominal aortic aneurysm. Other:  None. Musculoskeletal:  No suspicious bone lesions identified. IMPRESSION: Hepatic cirrhosis. Two right hepatic lobe masses are definite hepatocellular carcinoma (LI-RADS 5), and  a 3rd hypervascular lesion in the liver dome is probably hepatocellular carcinoma (LIRADS-4). Mild porta hepatis and retroperitoneal lymphadenopathy, suspicious for metastatic disease. Mild-to-moderate ascites. Electronically Signed   By: Earle Gell M.D.   On: 05/31/2018 19:19   US Paracentesis  Result Date: 06/01/2018 INDICATION: Patient with history of cirrhosis, 2 right hepatic lobe masses, abdominal distension, and ascites. Request is made for diagnostic and  therapeutic paracentesis. EXAM: ULTRASOUND GUIDED DIAGNOSTIC AND THERAPEUTIC PARACENTESIS MEDICATIONS: 10 mL of 1% lidocaine COMPLICATIONS: None immediate. PROCEDURE: Informed written consent was obtained from the patient after a discussion of the risks, benefits and alternatives to treatment. A timeout was performed prior to the initiation of the procedure. Initial ultrasound scanning demonstrates a small amount of ascites within the right lower abdominal quadrant. The right lower abdomen was prepped and draped in the usual sterile fashion. 1% lidocaine was used for local anesthesia. Following this, a 19 gauge, 7-cm, Yueh catheter was introduced. An ultrasound image was saved for documentation purposes. The paracentesis was performed. The catheter was removed and a dressing was applied. The patient tolerated the procedure well without immediate post procedural complication. FINDINGS: A total of approximately 700 mL of clear light yellow fluid was removed. Samples were sent to the laboratory as requested by the clinical team. IMPRESSION: Successful ultrasound-guided paracentesis yielding 700 mL of peritoneal fluid. Read by: Earley Abide, PA-C Electronically Signed   By: Jerilynn Mages.  Shick M.D.   On: 06/01/2018 16:12     Subjective: Hypoglycemic to 51 this morning again.  Otherwise no major event overnight.  No complaints this morning.  Denies chest pain, dyspnea, palpitation, abdominal pain, nausea, vomiting or diarrhea.  Discharge Exam: Vitals:   06/02/18 0611 06/02/18 1407  BP: (!) 183/80 (!) 176/87  Pulse: 96 99  Resp: 16 19  Temp: 98.8 F (37.1 C) 98.8 F (37.1 C)  SpO2: 94% 95%    GENERAL: Appears well. No acute distress.  HEENT: MMM.  Vision and Hearing grossly intact.  NECK: Supple.  No JVD.  LUNGS:  No IWOB. Good air movement. CTAB.  HEART:  RRR. Heart sounds normal.  ABD: Bowel sounds present. Soft. Non tender.  EXT:   no edema bilaterally.  SKIN: no apparent skin lesion.  NEURO: Awake,  alert and oriented appropriately.  No gross deficit.  PSYCH: Calm. Normal affect.  The results of significant diagnostics from this hospitalization (including imaging, microbiology, ancillary and laboratory) are listed below for reference.     Microbiology: Recent Results (from the past 240 hour(s))  Body fluid culture     Status: None (Preliminary result)   Collection Time: 06/01/18  4:12 PM  Result Value Ref Range Status   Specimen Description   Final    PERITONEAL Performed at [facility] [address] [place], [place] [address]    Special Requests   Final    NONE Performed at [facility], [place] [address], [place], [place] [address]    Gram Stain NO WBC SEEN NO ORGANISMS SEEN   Final   Culture   Final    NO GROWTH < 24 HOURS Performed at [facility], [place] [address] [place], [place] [address]    Report Status PENDING  Incomplete     Labs: BNP (last 3 results) No results for input(s): BNP in the last 8760 hours. Basic Metabolic Panel: Recent Labs  Lab 05/31/18 0853 06/01/18 0515 06/02/18 0421  NA 131* 133* 133*  K 3.8 3.9 4.1  CL  94* 99 98  CO2 _0 GLUCOSE 60* 44* 97  BUN _1 CREATININE 0.64 0.60* 0.54*  CALCIUM 8.2* 8.0* 7.6*  MG 1.6*  --   --   PHOS 3.0  --   --    Liver Function Tests: Recent Labs  Lab 05/31/18 0853 06/01/18 0515  AST 43* 50*  ALT 22 22  ALKPHOS 127* 130*  BILITOT 0.8 0.6  PROT 6.8 6.4*  ALBUMIN 1.7* 1.6*   Recent Labs  Lab 05/31/18 0853  LIPASE 28   No results for input(s): AMMONIA in the last 168 hours. CBC: Recent Labs  Lab 05/31/18 0853 06/01/18 0515 06/02/18 0421  WBC 17.2* 15.6* 12.4*  NEUTROABS 13.7* 10.0* 7.1  HGB 10.5* 10.9* 9.6*  HCT 31.9* 33.0* 29.1*  MCV 94.4 93.0 96.0  PLT 399 339 316   Cardiac Enzymes: No results for input(s): CKTOTAL, CKMB, CKMBINDEX, TROPONINI in the last 168 hours. BNP: Invalid input(s): POCBNP CBG: Recent Labs  Lab  06/01/18 1646 06/01/18 2057 06/02/18 0820 06/02/18 0943 06/02/18 1132  GLUCAP 70 211* 51* 160* 171*   D-Dimer No results for input(s): DDIMER in the last 72 hours. Hgb A1c No results for input(s): HGBA1C in the last 72 hours. Lipid Profile No results for input(s): CHOL, HDL, LDLCALC, TRIG, CHOLHDL, LDLDIRECT in the last 72 hours. Thyroid function studies No results for input(s): TSH, T4TOTAL, T3FREE, THYROIDAB in the last 72 hours.  Invalid input(s): FREET3 Anemia work up No results for input(s): VITAMINB12, FOLATE, FERRITIN, TIBC, IRON, RETICCTPCT in the last 72 hours. Urinalysis    Component Value Date/Time   COLORURINE YELLOW 05/31/2018 0853   APPEARANCEUR HAZY (A) 05/31/2018 0853   APPEARANCEUR Clear 10/13/2012 2130   LABSPEC 1.015 05/31/2018 0853   LABSPEC 1.002 10/13/2012 2130   PHURINE 6.0 05/31/2018 0853   GLUCOSEU NEGATIVE 05/31/2018 0853   GLUCOSEU >=500 10/13/2012 2130   HGBUR MODERATE (A) 05/31/2018 0853   BILIRUBINUR NEGATIVE 05/31/2018 0853   BILIRUBINUR Negative 10/13/2012 2130   KETONESUR NEGATIVE 05/31/2018 0853   PROTEINUR >=300 (A) 05/31/2018 0853   UROBILINOGEN 0.2 11/06/2014 1910   NITRITE NEGATIVE 05/31/2018 0853   LEUKOCYTESUR NEGATIVE 05/31/2018 0853   LEUKOCYTESUR Negative 10/13/2012 2130   Sepsis Labs Invalid input(s): PROCALCITONIN,  WBC,  LACTICIDVEN   Time coordinating discharge: 45 minutes  SIGNED:  Mercy Riding, MD  [facility] 06/02/2018, 2:51 PM Pager [phone]  If 7PM-7AM, please contact night-coverage [email or web address] Password [email or web address]

## 2018-06-02 NOTE — Clinical Social Work Note (Signed)
Clinical Social Work Assessment  Patient Details  Name: [name] MRN: [id number] Date of Birth: 1955/12/10  Date of referral:  06/01/18               Reason for consult:  Housing Concerns/Homelessness                Permission sought to share information with:  Chartered certified accountant granted to share information::  Yes, Verbal Permission Granted  Name::        Agency::  [employer]  Relationship::     Contact Information:     Housing/Transportation Living arrangements for the past 2 months:  Barrister's clerk of Information:  Patient Patient Interpreter Needed:  None Criminal Activity/Legal Involvement Pertinent to Current Situation/Hospitalization:  No - Comment as needed Significant Relationships:  Other Family Members, Friend Lives with:  (homeless- staying at [facility]) Do you feel safe going back to the place where you live?  Yes Need for family participation in patient care:  No (Coment)  Care giving concerns:  Pt admitted from [facility] where he has been staying for about 3 months. Prior to that was living in hotel, working part time. 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 Has liver mass which he is being worked up.   Social Worker assessment / plan:  CSW met with pt to seek to understanding housing situation. See above, pt staying at [facility] for past several months and is in contact with staff there- they are aware of his admission and he is planning to return there at [place].  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 States he has a PCP. Is awaiting results from work up for liver lesions/masses.   Employment status:  Unemployed Forensic scientist:  Self Pay (Medicaid Pending) PT  Recommendations:    Information / Referral to community resources:     Patient/Family's Response to care: appreciative  Patient/Family's Understanding of and Emotional Response to Diagnosis, Current Treatment, and Prognosis: discussed generalities of hospital course this far - pt seemed very understanding, his description matching charting. Emotionally was very pleasant and accepting  Emotional Assessment Appearance:  Appears stated age Attitude/Demeanor/Rapport:  Engaged Affect (typically observed):  Accepting, Calm, Appropriate Orientation:  Oriented to Self, Oriented to Place, Oriented to  Time, Oriented to Situation Alcohol / Substance use:  Not Applicable Psych involvement (Current and /or in the community):  No (Comment)  Discharge Needs  Concerns to be addressed:    Readmission within the last 30 days:  No Current discharge risk:  Homeless Barriers to Discharge:  Continued Medical Work up   [employer], [place] 06/02/2018, 11:02 AM [phone]

## 2018-06-02 NOTE — Progress Notes (Addendum)
[facility] Progress Note  CC:  Cirrhosis, liver lesions  Subjective:  Had 700 mL's of ascites fluid removed on 1/23.  Negative for SBP.  AFP normal but MRI confirms two lesions are HCC and possibly a third with enlarged RP lymph nodes as well.  Objective:  Vital signs in last 24 hours: Temp:  [98.4 F (36.9 C)-98.8 F (37.1 C)] 98.8 F (37.1 C) (01/24 0611) Pulse Rate:  [91-103] 96 (01/24 0611) Resp:  [16-20] 16 (01/24 0611) BP: (151-183)/(73-80) 183/80 (01/24 0611) SpO2:  [92 %-94 %] 94 % (01/24 0611) Last BM Date: 06/01/18 General:  Alert, Well-developed, in NAD Heart:  Regular rate and rhythm; no murmurs Pulm:  CTAB.  No W/R/R. Abdomen:  Soft, still slightly distended.  BS present.  Non-tender.   Extremities:  2+ pitting edema in B/L LE's with brawny stasis dermatitis skin changes. Neurologic:  Alert and oriented x 4;  grossly normal neurologically. Psych:  Alert and cooperative. Normal mood and affect.  Intake/Output from previous day: 01/23 0701 - 01/24 0700 In: 980 [P.O.:980] Out: 1325 [Urine:1325]  Lab Results: Recent Labs    05/31/18 0853 06/01/18 0515 06/02/18 0421  WBC 17.2* 15.6* 12.4*  HGB 10.5* 10.9* 9.6*  HCT 31.9* 33.0* 29.1*  PLT 399 339 316   BMET Recent Labs    05/31/18 0853 06/01/18 0515 06/02/18 0421  NA 131* 133* 133*  K 3.8 3.9 4.1  CL 94* 99 98  CO2 _0 GLUCOSE 60* 44* 97  BUN _1 CREATININE 0.64 0.60* 0.54*  CALCIUM 8.2* 8.0* 7.6*   LFT Recent Labs    06/01/18 0515  PROT 6.4*  ALBUMIN 1.6*  AST 50*  ALT 22  ALKPHOS 130*  BILITOT 0.6   PT/INR Recent Labs    05/31/18 0853  LABPROT 14.0  INR 1.09   Ct Chest Wo Contrast  Result Date: 05/31/2018 CLINICAL DATA:  63 year old male with cough. Nodule was seen on the CT of the abdomen pelvis. EXAM: CT CHEST WITHOUT CONTRAST TECHNIQUE: Multidetector CT imaging of the chest was performed following the standard protocol without IV contrast.  COMPARISON:  CT of the abdomen pelvis dated 05/31/2018 and chest radiograph dated 05/12/2018 FINDINGS: Evaluation of this exam is limited in the absence of intravenous contrast. Cardiovascular: There is no cardiomegaly. There is a small pericardial effusion measuring 8 mm anterior to the heart. There is multi vessel coronary vascular calcification as well as calcification of the mitral annulus. There is mild atherosclerotic calcification of the thoracic aorta. The central pulmonary arteries are grossly unremarkable on this noncontrast CT. Mediastinum/Nodes: There is no hilar or mediastinal adenopathy. The esophagus and the thyroid gland are grossly unremarkable as visualized. No mediastinal fluid collection. Lungs/Pleura: There are biapical subpleural blebs. There is a 4 mm nodule in the right lower lobe (series 5, image 93). There is no focal consolidation, pleural effusion, or pneumothorax. An accessory azygos fissure is noted. The central airways are patent. Upper Abdomen: There is morphologic changes of cirrhosis. There is a partially visualized small ascites and upper abdominal and paraesophageal varices. Musculoskeletal: There is mild diffuse subcutaneous edema. The osseous structures are intact. IMPRESSION: 1. No acute intrathoracic pathology. 2. A 4 mm right lower lobe pulmonary nodule No follow-up needed if patient is low-risk. Non-contrast chest CT can be considered in 12 months if patient is high-risk. This recommendation follows the consensus statement: Guidelines for Management of Incidental Pulmonary Nodules Detected on CT Images: From the  Fleischner Society 2017; Radiology 2017; 284:228-243. 3. Cirrhosis, ascites, and upper abdominal varices. Electronically Signed   By: Anner Crete M.D.   On: 05/31/2018 19:21   Ct Abdomen Pelvis W Contrast  Result Date: 05/31/2018 CLINICAL DATA:  History of hepatitis C cirrhosis. Nausea. Abdominal pain and distention. Constipation. EXAM: CT ABDOMEN AND PELVIS  WITH CONTRAST TECHNIQUE: Multidetector CT imaging of the abdomen and pelvis was performed using the standard protocol following bolus administration of intravenous contrast. CONTRAST:  112m ISOVUE-300 IOPAMIDOL (ISOVUE-300) INJECTION 61% COMPARISON:  01/24/2018 abdominal sonogram. FINDINGS: Lower chest: Mild hypoventilatory changes at the dependent lung bases. Limited visualization of a right lower lobe 4 mm pulmonary nodule (series 8/image 1). Moderate lower esophageal varices. Coronary atherosclerosis. Hepatobiliary: Diffusely irregular liver surface compatible with hepatic cirrhosis. Peripheral segment 8 right liver lobe 2.3 x 2.2 cm mass (series 2/image 24) demonstrates arterial hyperenhancement and portal phase washout. Posterior segment 6 right liver lobe 2.0 x 1.8 cm mass (series 2/image 39) also demonstrates arterial hyperenhancement and portal phase washout. A few scattered coarse subcentimeter right liver lobe granulomatous calcifications. No additional liver lesions. Distended gallbladder (5.8 cm diameter). Mild diffuse gallbladder wall thickening. No radiopaque cholelithiasis. No biliary ductal dilatation. Pancreas: Normal, with no mass or duct dilation. Spleen: Normal size. No mass. Adrenals/Urinary Tract: Normal adrenals. Normal kidneys with no hydronephrosis and no renal mass. Normal bladder. Stomach/Bowel: Normal non-distended stomach. Normal caliber small bowel with no small bowel wall thickening. Normal appendix. Normal large bowel with no diverticulosis, large bowel wall thickening or pericolonic fat stranding. Vascular/Lymphatic: Atherosclerotic nonaneurysmal abdominal aorta. Patent portal, splenic, hepatic and renal veins. Mildly enlarged 1.2 cm portacaval node (series 4/image 45). Mildly enlarged 1.2 cm left para celiac node (series 4/image 48). Mildly enlarged posterior peripancreatic nodes up to 1.2 cm (series 4/image 54). No pathologically enlarged pelvic nodes. Reproductive: Mildly  enlarged prostate with nonspecific internal prostatic calcifications. Other: No pneumoperitoneum. Small to moderate volume ascites. No focal fluid collection. Mild anasarca. Musculoskeletal: No aggressive appearing focal osseous lesions. Mild thoracolumbar spondylosis. IMPRESSION: 1. Hepatic cirrhosis. 2. Two hyperenhancing liver masses (2.3 cm in segment 8 right liver lobe and 2.0 cm in segment 6 right liver lobe) with features highly suspicious for multifocal hepatocellular carcinoma. MRI abdomen without and with IV contrast is indicated for further characterization. 3. Small to moderate volume ascites. Moderate esophageal varices. Mild anasarca. 4. Nonspecific mild diffuse gallbladder wall thickening without radiopaque cholelithiasis, potentially due to noninflammatory edema. 5. Mild upper retroperitoneal adenopathy is nonspecific and could be reactive or metastatic. CT abdomen with IV contrast follow-up recommended in 3 months. 6. Right lower lobe 4 mm solid pulmonary nodule. Dedicated chest CT recommended for further evaluation. 7. Mild prostatomegaly. 8.  Aortic Atherosclerosis (ICD10-I70.0). Electronically Signed   By: JIlona SorrelM.D.   On: 05/31/2018 11:49   Mr Liver W WNLContrast  Result Date: 05/31/2018 CLINICAL DATA:  Cirrhosis. Chronic hepatitis-C. Abdominal pain and distention. Liver masses on recent CT. EXAM: MRI ABDOMEN WITHOUT AND WITH CONTRAST TECHNIQUE: Multiplanar multisequence MR imaging of the abdomen was performed both before and after the administration of intravenous contrast. CONTRAST:  9 mL Gadavist COMPARISON:  CT on 05/31/2018 FINDINGS: Lower chest: No acute findings. Hepatobiliary: Hepatic cirrhosis again demonstrated. Mild-to-moderate ascites. A 2.2 x 2.0 cm mass with arterial phase enhancement is seen in segment 8 on image 21/901. Another similar mass is seen in segment 6/7 measuring 3.2 x 3.1 cm on image 32/903. Both of these masses show rapid contrast washout and  delayed  capsular enhancement, consistent with hepatocellular carcinoma (LI-RADS 5). A 1.8 cm hypervascular lesion is seen in the liver dome the junction of segment 7/8 on image 18/901. This shows arterial phase hyperenhancement with enhancing capsule rim, but no significant washout. (LI-RADS 4). Gallbladder is unremarkable no evidence of biliary ductal dilatation. Pancreas: No mass or inflammatory changes. Spleen:  Within normal limits in size and appearance. Adrenals/Urinary Tract: No masses identified. No evidence of hydronephrosis. Stomach/Bowel: Visualized portion unremarkable. Vascular/Lymphatic: Mild lymphadenopathy is seen in the porta hepatis, with largest lymph node measuring 12 mm. Mild lymphadenopathy in the aortocaval and left paraaortic regions. Largest lymph node in the left paraaortic region measures 1.4 cm. No evidence of abdominal aortic aneurysm. Other:  None. Musculoskeletal:  No suspicious bone lesions identified. IMPRESSION: Hepatic cirrhosis. Two right hepatic lobe masses are definite hepatocellular carcinoma (LI-RADS 5), and a 3rd hypervascular lesion in the liver dome is probably hepatocellular carcinoma (LIRADS-4). Mild porta hepatis and retroperitoneal lymphadenopathy, suspicious for metastatic disease. Mild-to-moderate ascites. Electronically Signed   By: Earle Gell M.D.   On: 05/31/2018 19:19   US Paracentesis  Result Date: 06/01/2018 INDICATION: Patient with history of cirrhosis, 2 right hepatic lobe masses, abdominal distension, and ascites. Request is made for diagnostic and therapeutic paracentesis. EXAM: ULTRASOUND GUIDED DIAGNOSTIC AND THERAPEUTIC PARACENTESIS MEDICATIONS: 10 mL of 1% lidocaine COMPLICATIONS: None immediate. PROCEDURE: Informed written consent was obtained from the patient after a discussion of the risks, benefits and alternatives to treatment. A timeout was performed prior to the initiation of the procedure. Initial ultrasound scanning demonstrates a small amount of  ascites within the right lower abdominal quadrant. The right lower abdomen was prepped and draped in the usual sterile fashion. 1% lidocaine was used for local anesthesia. Following this, a 19 gauge, 7-cm, Yueh catheter was introduced. An ultrasound image was saved for documentation purposes. The paracentesis was performed. The catheter was removed and a dressing was applied. The patient tolerated the procedure well without immediate post procedural complication. FINDINGS: A total of approximately 700 mL of clear light yellow fluid was removed. Samples were sent to the laboratory as requested by the clinical team. IMPRESSION: Successful ultrasound-guided paracentesis yielding 700 mL of peritoneal fluid. Read by: Earley Abide, PA-C Electronically Signed   By: Jerilynn Mages.  Shick M.D.   On: 06/01/2018 16:12   Assessment / Plan: 1. LLQ abdominal pain:  Most likely due to ascites, constipation.  Seems to be resolved at this point. 2. Cirrhosis, history of ETOH abuse (abstinent for 2 years) and Chronic Hepatitis C (treated with Epclusa 01/2018) 3. Liver masses per CT:Two lesions definite HCC by MRI with a third suspicious lesion as well as RP lymph nodes. 4. Ascites:  S/p paracentesis on 1/23 with 700 cc's removed, negative for SBP. 5. Leukocytosis, SBP negative, unlikely residual effects from Prednisone taper prescribed 05/12/2018-05/18/2018.  Count coming down. 6. Normocytic anemia likely secondary to cirrhosis:  Hgb stable. 7. Esophageal Varices per abd/pelvic CT 8. Hyponatremia secondary to liver disease:  Improved. 9. Pulmonary nodules  -Dr. Silverio Decamp to re-discuss with IR.  ? If RP nodes need/can be biopsied.  This will all affect his treatment, whether he is a candidate for chemoembolization, etc. -Follow-up Hep C RNA labs. -Will need EGD at some point as well to evaluate varices seen on CT scan. -Will start lasix 20 mg daily and spironolactone 50 mg daily.   LOS: 1 day   Laban Emperor. Zehr  06/02/2018, 9:13  AM    Attending physician's note  I have taken an interval history, reviewed the chart and examined the patient. I agree with the Advanced Practitioner's note, impression and recommendations.   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 Kathlene Cote, interventional radiologist.  He could be a potential candidate for local regional therapy with Y 90.  He does not think the retroperitoneal nodes are metastatic disease, they are often seen in patients with cirrhosis.  IR will see patient in office for consultation and discuss the potential treatment plan. AFP 4.2, CEA mildly elevated at 8.7 Follow-up with oncology as outpatient.  Chronic hep C, status post treatment with undetectable viral load  Ascites: New-onset secondary to liver decompensation and HCC Diagnostic paracentesis negative for SBP Lasix 20 mg daily and spironolactone 50 mg daily  Esophageal varices: Start nadolol 20 mg daily  Will arrange for EGD and colonoscopy as outpatient.  Follow-up in GI office next available appointment.   Transplant status:?  Social situation may make it challenging.  Will discuss further when patient will come for outpatient follow-up and refer to transplant center if he is interested.  Okay to discharge home from GI standpoint.   Damaris Hippo , MD [phone]

## 2018-06-02 NOTE — Progress Notes (Signed)
CSW received a call from pt's RN/CN stating pt is refusing a bus pass and requesting a taxi voucher citing the need to walk to [employer] for a bus" and citing the heavy rain as an obstacle to riding the bus.  CSW confirmed pt is ambulatory and offered to speak to pt to relate bus stop is directly across the street from the hospital at [place] picked up a heavy x-large coat from charity closet in the ED  so pt can drape over his head for protection against the rain and brought to pt's room as there were no extra umbrellas available.   Pt refused the coat stating he didn't want to carry the extra item and refused security taking him to the bus stop because, "It's just across the street, thank you anyway, but I can get there" and then said, "God bless you".  CSW updated RN who stated she would request security drive the pt to the bus stop and had already provided pt with a bus pass.  Please reconsult if future social work needs arise.  CSW signing off, as social work intervention is no longer needed.  Alphonse Guild. Mayra Brahm, LCSW, LCAS, CSI Clinical Social Worker Ph: [phone]

## 2018-06-02 NOTE — Progress Notes (Signed)
  Oncology Nurse Navigator Documentation   Dr. Cyndia Skeeters called requesting a med/onc appointment for Mr. Jeansonne who is discharging from [facility] this afternoon. Probable HCC per Dr. Cyndia Skeeters. Patient scheduled to see Dr. [name] on 06/12/18 @ 2 PM.

## 2018-06-02 NOTE — Progress Notes (Signed)
  I have entered an order in Epic for outpatient appointment to discuss liver directed treatment strategy for hepatocellular carcinoma.  Our office will call patient with appointment date and time.  Clair Bardwell S Whitni Pasquini PA-C 06/02/2018 3:04 PM

## 2018-06-05 LAB — BODY FLUID CULTURE
Culture: NO GROWTH
Gram Stain: NONE SEEN

## 2018-06-08 ENCOUNTER — Ambulatory Visit (AMBULATORY_SURGERY_CENTER): Payer: Self-pay | Admitting: *Deleted

## 2018-06-08 ENCOUNTER — Telehealth: Payer: Self-pay

## 2018-06-08 VITALS — Ht 72.0 in | Wt 200.0 lb

## 2018-06-08 DIAGNOSIS — K7460 Unspecified cirrhosis of liver: Secondary | ICD-10-CM

## 2018-06-08 DIAGNOSIS — R160 Hepatomegaly, not elsewhere classified: Secondary | ICD-10-CM

## 2018-06-08 MED ORDER — PEG-KCL-NACL-NASULF-NA ASC-C 140 G PO SOLR: 1.0000 | Freq: Once | ORAL | 0 refills | Status: AC

## 2018-06-08 NOTE — Telephone Encounter (Signed)
Patient did come in for the pre-visit appointment today.  He is scheduled for the EGD/Colon on 06/12/2018 at [facility].

## 2018-06-08 NOTE — Progress Notes (Signed)
Patient denies any allergies to eggs or soy. Patient denies any problems with anesthesia/sedation. Patient denies any oxygen use at home. Patient denies taking any diet/weight loss medications or blood thinners.

## 2018-06-08 NOTE — Telephone Encounter (Signed)
-----  Message from Mauri Pole, MD sent at 06/02/2018  5:36 PM EST ----- Jess, please get me when he comes in. If he has a reliable care partner, we may be able to get him in for procedure at [facility] or I will try to do him at the hospital.  Thanks ----- Message ----- From: Greggory Keen, LPN Sent: 9/35/7017   4:17 PM EST To: Mauri Pole, MD  I have him scheduled with Janett Billow 06/15/2018 when you are in the office.  ----- Message ----- From: Mauri Pole, MD Sent: 06/02/2018   2:11 PM EST To: Greggory Keen, LPN  Get him in with an extender within next 1-2 weeks. Will try to find a time to do procedures (EGD and colonoscopy) sooner once he comes in for office visit. Thanks VN ----- Message ----- From: Greggory Keen, LPN Sent: 7/93/9030   1:38 PM EST To: Loralie Champagne, PA-C, Mauri Pole, MD  Feb 21 at 9:15 am is the first opening on the schedule. The March hospital procedure schedule has not been released.  ----- Message ----- From: Loralie Champagne, PA-C Sent: 06/02/2018   1:30 PM EST To: Greggory Keen, LPN  Per Dr. Silverio Decamp, needs first available EGD at [facility] with her and needs first available hospital OV follow-up with her as well.  Patient is currently inpatient but anticipate discharge in the next day or so.  Thank you, Jess

## 2018-06-12 ENCOUNTER — Other Ambulatory Visit: Payer: Self-pay

## 2018-06-12 ENCOUNTER — Ambulatory Visit: Payer: Self-pay | Admitting: Oncology

## 2018-06-12 ENCOUNTER — Encounter (HOSPITAL_COMMUNITY): Payer: Self-pay | Admitting: Anesthesiology

## 2018-06-12 ENCOUNTER — Ambulatory Visit (HOSPITAL_COMMUNITY): Payer: Medicaid Other | Admitting: Anesthesiology

## 2018-06-12 ENCOUNTER — Ambulatory Visit (HOSPITAL_COMMUNITY)
Admission: RE | Admit: 2018-06-12 | Discharge: 2018-06-12 | Disposition: A | Discharge status: Home / Self Care | Payer: Medicaid Other | Attending: Gastroenterology | Admitting: Gastroenterology

## 2018-06-12 ENCOUNTER — Encounter (HOSPITAL_COMMUNITY)
Admission: RE | Disposition: A | Discharge status: Home / Self Care | Payer: Self-pay | Source: Home / Self Care | Attending: Gastroenterology

## 2018-06-12 ENCOUNTER — Telehealth: Payer: Self-pay | Admitting: Oncology

## 2018-06-12 DIAGNOSIS — K648 Other hemorrhoids: Secondary | ICD-10-CM | POA: Diagnosis not present

## 2018-06-12 DIAGNOSIS — I8500 Esophageal varices without bleeding: Secondary | ICD-10-CM | POA: Insufficient documentation

## 2018-06-12 DIAGNOSIS — F419 Anxiety disorder, unspecified: Secondary | ICD-10-CM | POA: Insufficient documentation

## 2018-06-12 DIAGNOSIS — Z794 Long term (current) use of insulin: Secondary | ICD-10-CM | POA: Insufficient documentation

## 2018-06-12 DIAGNOSIS — K210 Gastro-esophageal reflux disease with esophagitis: Secondary | ICD-10-CM | POA: Insufficient documentation

## 2018-06-12 DIAGNOSIS — R970 Elevated carcinoembryonic antigen [CEA]: Secondary | ICD-10-CM

## 2018-06-12 DIAGNOSIS — D649 Anemia, unspecified: Secondary | ICD-10-CM | POA: Insufficient documentation

## 2018-06-12 DIAGNOSIS — F329 Major depressive disorder, single episode, unspecified: Secondary | ICD-10-CM | POA: Insufficient documentation

## 2018-06-12 DIAGNOSIS — F17200 Nicotine dependence, unspecified, uncomplicated: Secondary | ICD-10-CM | POA: Diagnosis not present

## 2018-06-12 DIAGNOSIS — E1140 Type 2 diabetes mellitus with diabetic neuropathy, unspecified: Secondary | ICD-10-CM | POA: Insufficient documentation

## 2018-06-12 DIAGNOSIS — E119 Type 2 diabetes mellitus without complications: Secondary | ICD-10-CM | POA: Insufficient documentation

## 2018-06-12 DIAGNOSIS — B1920 Unspecified viral hepatitis C without hepatic coma: Secondary | ICD-10-CM | POA: Insufficient documentation

## 2018-06-12 DIAGNOSIS — Z79899 Other long term (current) drug therapy: Secondary | ICD-10-CM | POA: Insufficient documentation

## 2018-06-12 DIAGNOSIS — K7031 Alcoholic cirrhosis of liver with ascites: Secondary | ICD-10-CM | POA: Diagnosis not present

## 2018-06-12 DIAGNOSIS — K2970 Gastritis, unspecified, without bleeding: Secondary | ICD-10-CM | POA: Diagnosis not present

## 2018-06-12 DIAGNOSIS — K449 Diaphragmatic hernia without obstruction or gangrene: Secondary | ICD-10-CM | POA: Insufficient documentation

## 2018-06-12 DIAGNOSIS — D120 Benign neoplasm of cecum: Secondary | ICD-10-CM | POA: Insufficient documentation

## 2018-06-12 DIAGNOSIS — C220 Liver cell carcinoma: Secondary | ICD-10-CM | POA: Insufficient documentation

## 2018-06-12 DIAGNOSIS — F17210 Nicotine dependence, cigarettes, uncomplicated: Secondary | ICD-10-CM | POA: Diagnosis not present

## 2018-06-12 DIAGNOSIS — I8510 Secondary esophageal varices without bleeding: Secondary | ICD-10-CM

## 2018-06-12 HISTORY — PX: BIOPSY: SHX5522

## 2018-06-12 HISTORY — PX: ESOPHAGOGASTRODUODENOSCOPY (EGD) WITH PROPOFOL: SHX5813

## 2018-06-12 HISTORY — PX: COLONOSCOPY WITH PROPOFOL: SHX5780

## 2018-06-12 LAB — GLUCOSE, CAPILLARY: Glucose-Capillary: 207 mg/dL — ABNORMAL HIGH (ref 70–99)

## 2018-06-12 SURGERY — ESOPHAGOGASTRODUODENOSCOPY (EGD) WITH PROPOFOL
Anesthesia: Monitor Anesthesia Care

## 2018-06-12 MED ORDER — NADOLOL 20 MG PO TABS: 20.0000 mg | ORAL_TABLET | Freq: Every day | ORAL | 3 refills | Status: DC

## 2018-06-12 MED ORDER — PROPOFOL 500 MG/50ML IV EMUL
INTRAVENOUS | Status: DC | PRN
  Administered 2018-06-12: 40 mg via INTRAVENOUS
  Administered 2018-06-12: 10 mg via INTRAVENOUS
  Administered 2018-06-12: 50 mg via INTRAVENOUS

## 2018-06-12 MED ORDER — LACTATED RINGERS IV SOLN
INTRAVENOUS | Status: DC
  Administered 2018-06-12: 1000 mL via INTRAVENOUS

## 2018-06-12 MED ORDER — ONDANSETRON HCL 4 MG/2ML IJ SOLN
INTRAMUSCULAR | Status: DC | PRN
  Administered 2018-06-12: 4 mg via INTRAVENOUS

## 2018-06-12 MED ORDER — PROPOFOL 10 MG/ML IV BOLUS
INTRAVENOUS | Status: AC
  Filled 2018-06-12: qty 80

## 2018-06-12 MED ORDER — SPIRONOLACTONE 50 MG PO TABS: 100.0000 mg | ORAL_TABLET | Freq: Every day | ORAL | 3 refills | Status: DC

## 2018-06-12 MED ORDER — PANTOPRAZOLE SODIUM 40 MG PO TBEC: 40.0000 mg | DELAYED_RELEASE_TABLET | Freq: Every day | ORAL | 3 refills | Status: DC

## 2018-06-12 MED ORDER — PROPOFOL 500 MG/50ML IV EMUL
INTRAVENOUS | Status: DC | PRN
  Administered 2018-06-12: 125 ug/kg/min via INTRAVENOUS

## 2018-06-12 SURGICAL SUPPLY — 24 items

## 2018-06-12 NOTE — Discharge Instructions (Signed)
YOU HAD AN ENDOSCOPIC PROCEDURE TODAY: Refer to the procedure report and other information in the discharge instructions given to you for any specific questions about what was found during the examination. If this information does not answer your questions, please call [place] office at [phone] to clarify.   YOU SHOULD EXPECT: Some feelings of bloating in the abdomen. Passage of more gas than usual. Walking can help get rid of the air that was put into your GI tract during the procedure and reduce the bloating. If you had a lower endoscopy (such as a colonoscopy or flexible sigmoidoscopy) you may notice spotting of blood in your stool or on the toilet paper. Some abdominal soreness may be present for a day or two, also.  DIET: Your first meal following the procedure should be a light meal and then it is ok to progress to your normal diet. A half-sandwich or bowl of soup is an example of a good first meal. Heavy or fried foods are harder to digest and may make you feel nauseous or bloated. Drink plenty of fluids but you should avoid alcoholic beverages for 24 hours. If you had a esophageal dilation, please see attached instructions for diet.    ACTIVITY: Your care partner should take you home directly after the procedure. You should plan to take it easy, moving slowly for the rest of the day. You can resume normal activity the day after the procedure however YOU SHOULD NOT DRIVE, use power tools, machinery or perform tasks that involve climbing or major physical exertion for 24 hours (because of the sedation medicines used during the test).   SYMPTOMS TO REPORT IMMEDIATELY: A gastroenterologist can be reached at any hour. Please call [phone]  for any of the following symptoms:  Following lower endoscopy (colonoscopy, flexible sigmoidoscopy) Excessive amounts of blood in the stool  Significant tenderness, worsening of abdominal pains  Swelling of the abdomen that is new, acute  Fever of 100 or  higher  Following upper endoscopy (EGD, EUS, ERCP, esophageal dilation) Vomiting of blood or coffee ground material  New, significant abdominal pain  New, significant chest pain or pain under the shoulder blades  Painful or persistently difficult swallowing  New shortness of breath  Black, tarry-looking or red, bloody stools  FOLLOW UP:  If any biopsies were taken you will be contacted by phone or by letter within the next 1-3 weeks. Call [phone]  if you have not heard about the biopsies in 3 weeks.  Please also call with any specific questions about appointments or follow up tests.

## 2018-06-12 NOTE — Op Note (Signed)
[facility] Patient Name: [name] Procedure Date: 06/12/2018 MRN: [id number] Attending MD: Mauri Pole , MD Date of Birth: Jul 17, 1955 CSN: [id number] Age: 63 Admit Type: Outpatient Procedure:                Colonoscopy Indications:              Hepatocellular carcinoma. Elevated CEA Providers:                Mauri Pole, MD, Cleda Daub, RN, Cletis Athens, Technician, Arnoldo Hooker, CRNA Referring MD:              Medicines:                Monitored Anesthesia Care Complications:            No immediate complications. Estimated Blood Loss:     Estimated blood loss was minimal. Procedure:                Pre-Anesthesia Assessment:                           - Prior to the procedure, a History and Physical                            was performed, and patient medications and                            allergies were reviewed. The patient's tolerance of                            previous anesthesia was also reviewed. The risks                            and benefits of the procedure and the sedation                            options and risks were discussed with the patient.                            All questions were answered, and informed consent                            was obtained. Prior Anticoagulants: The patient has                            taken no previous anticoagulant or antiplatelet                            agents. ASA Grade Assessment: IV - A patient with                            severe systemic disease that is a constant threat  to life. After reviewing the risks and benefits,                            the patient was deemed in satisfactory condition to                            undergo the procedure.                           After obtaining informed consent, the colonoscope                            was passed under direct vision. Throughout the   procedure, the patient's blood pressure, pulse, and                            oxygen saturations were monitored continuously. The                            [id number]-[id number] ([id number] Olympus colonoscope was                            introduced through the anus and advanced to the the                            cecum, identified by appendiceal orifice and                            ileocecal valve. The colonoscopy was performed                            without difficulty. The patient tolerated the                            procedure well. The quality of the bowel                            preparation was adequate. The ileocecal valve,                            appendiceal orifice, and rectum were photographed. Scope In: 8:48:22 AM Scope Out: 9:08:37 AM Scope Withdrawal Time: 0 hours 9 minutes 17 seconds  Total Procedure Duration: 0 hours 20 minutes 15 seconds  Findings:      The perianal and digital rectal examinations were normal.      A 1 mm polyp was found in the cecum. The polyp was sessile. The polyp       was removed with a cold biopsy forceps. Resection and retrieval were       complete.      Non-bleeding internal hemorrhoids were found during retroflexion. The       hemorrhoids were small.      The exam was otherwise without abnormality. Impression:               - One 1 mm polyp in the cecum, removed with a cold  biopsy forceps. Resected and retrieved.                           - Non-bleeding internal hemorrhoids.                           - The examination was otherwise normal. Moderate Sedation:      Not Applicable - Patient had care per Anesthesia. Recommendation:           - Patient has a contact number available for                            emergencies. The signs and symptoms of potential                            delayed complications were discussed with the                            patient. Return to normal activities tomorrow.                             Written discharge instructions were provided to the                            patient.                           - See the other procedure note for documentation of                            additional recommendations.                           - Repeat colonoscopy in 5-10 years for surveillance. Procedure Code(s):        --- Professional ---                           [phone], Colonoscopy, flexible; with biopsy, single                            or multiple Diagnosis Code(s):        --- Professional ---                           D12.0, Benign neoplasm of cecum                           K64.8, Other hemorrhoids CPT copyright 2018 American Medical Association. All rights reserved. The codes documented in this report are preliminary and upon coder review may  be revised to meet current compliance requirements. Mauri Pole, MD 06/12/2018 9:27:26 AM This report has been signed electronically. Number of Addenda: 0

## 2018-06-12 NOTE — Telephone Encounter (Signed)
Patient spouse came in to reschedule appt due to him having a procedure this morning.  Okay per [employer]

## 2018-06-12 NOTE — Transfer of Care (Signed)
Immediate Anesthesia Transfer of Care Note  Patient: [name]  Procedure(s) Performed: Procedure(s): ESOPHAGOGASTRODUODENOSCOPY (EGD) WITH PROPOFOL (N/A) COLONOSCOPY WITH PROPOFOL (N/A) BIOPSY  Patient Location: PACU  Anesthesia Type:MAC  Level of Consciousness:  sedated, patient cooperative and responds to stimulation  Airway & Oxygen Therapy:Patient Spontanous Breathing and Patient connected to face mask oxgen  Post-op Assessment:  Report given to PACU RN and Post -op Vital signs reviewed and stable  Post vital signs:  Reviewed and stable  Last Vitals:  Vitals:   06/12/18 0732  BP: (!) 175/81  Pulse: (!) 106  Resp: 10  Temp: 36.6 C  SpO2: 32%    Complications: No apparent anesthesia complications

## 2018-06-12 NOTE — H&P (Signed)
[facility] History and Physical   Primary Care Physician:  Marliss Coots, NP   Reason for Procedure:  Cirrhosis, hepatocellular carcinoma, esophageal varices screening, elevated CEA  Plan:    EGD and colonoscopy with possible esophageal banding and intervention     HPI: [name] is a 63 y.o. male with history of EtOH and hepatitis C (s/p Epclusa with SVR) recently diagnosed with hepatocellular carcinoma, decompensated cirrhosis with ascites here for esophageal varices screening.  Never had colonoscopy, has mild elevation in CEA.  Will need to exclude colorectal cancer. Complaints of abdominal bloating and discomfort.  He is taking diuretics.  Past Medical History:  Diagnosis Date  . Alcoholic ([place])   . Alcoholic cirrhosis of liver ([place])    Pt calls this a false diagnosis. Pt denies  . Alcoholism /alcohol abuse ([place])   . Anxiety   . Chronic bronchitis ([place])   . Chronic hyponatremia    pt denies having   . Depression   . Diabetes mellitus   . Diabetes mellitus without complication ([place])   . Hepatitis C   . Hyperglycemia   . Neuropathy   . Substance abuse ([place])   . Thrombocytopenia ([place])   . Tobacco use 05/31/2018    Past Surgical History:  Procedure Laterality Date  . FRACTURE SURGERY  right lower leg plate placed years ago  . Plate in lower right leg  2008    Prior to Admission medications   Medication Sig Start Date End Date Taking? Authorizing Provider  albuterol (PROVENTIL HFA;VENTOLIN HFA) 108 (90 Base) MCG/ACT inhaler Inhale 2 puffs into the lungs every 6 (six) hours as needed for wheezing or shortness of breath.   Yes [provider]  furosemide (LASIX) 40 MG tablet Take 1 tablet (40 mg total) by mouth daily. Patient taking differently: Take 40 mg by mouth every evening.  03/22/18  Yes Yu, Amy V, PA-C  gabapentin (NEURONTIN) 400 MG capsule Take 3 capsules (1,200 mg total) by mouth 3 (three) times daily for 7 days. Patient taking  differently: Take 1,600 mg by mouth 3 (three) times daily.  06/02/18 06/09/18 Yes Mercy Riding, MD  insulin glargine (LANTUS) 100 UNIT/ML injection Inject 0.3 mLs (30 Units total) into the skin daily. Patient taking differently: Inject 25 Units into the skin daily.  06/02/18  Yes Mercy Riding, MD  metFORMIN (GLUCOPHAGE) 1000 MG tablet Take 1 tablet (1,000 mg total) by mouth 2 (two) times daily with a meal. 11/01/15  Yes Lord, Asa Saunas, NP  spironolactone (ALDACTONE) 50 MG tablet Take 1 tablet (50 mg total) by mouth daily. Patient taking differently: Take 50 mg by mouth every evening.  06/03/18  Yes Mercy Riding, MD    Current Facility-Administered Medications  Medication Dose Route Frequency Provider Last Rate Last Dose  . lactated ringers infusion   Intravenous Continuous Mauri Pole, MD 10 mL/hr at 06/12/18 0754      Allergies as of 06/07/2018 - Review Complete 06/07/2018  Allergen Reaction Noted  . Benadryl [diphenhydramine hcl] Other (See Comments) 09/27/2012  . Benadryl [diphenhydramine] Other (See Comments) 03/02/2015  . Sulfa antibiotics Other (See Comments) 03/02/2015  . Tegretol [carbamazepine] Other (See Comments) 11/20/2014  . Librium [chlordiazepoxide hcl] Anxiety 12/20/2011  . Librium [chlordiazepoxide] Anxiety 10/31/2015  . Sulfa antibiotics Rash 11/05/2011    Family History  Problem Relation Age of Onset  . ALS Father   . Colon cancer Neg Hx   . Colon polyps Neg Hx   .  Esophageal cancer Neg Hx   . Rectal cancer Neg Hx   . Stomach cancer Neg Hx     Social History   Socioeconomic History  . Marital status: Divorced    Spouse name: Not on file  . Number of children: Not on file  . Years of education: Not on file  . Highest education level: Not on file  Occupational History  . Not on file  Social Needs  . Financial resource strain: Not on file  . Food insecurity:    Worry: Not on file    Inability: Not on file  . Transportation needs:    Medical:  Not on file    Non-medical: Not on file  Tobacco Use  . Smoking status: Current Every Day Smoker    Packs/day: 0.50    Years: 40.00    Pack years: 20.00    Types: Cigarettes  . Smokeless tobacco: Never Used  Substance and Sexual Activity  . Alcohol use: Not Currently    Comment: 18/daily- reports has not drink in 2 1/2years  . Drug use: Yes    Frequency: 3.0 times per week    Types: Marijuana    Comment: for sleep; 2-3 times per week per pt  . Sexual activity: Not Currently  Lifestyle  . Physical activity:    Days per week: Not on file    Minutes per session: Not on file  . Stress: Not on file  Relationships  . Social connections:    Talks on phone: Not on file    Gets together: Not on file    Attends religious service: Not on file    Active member of club or organization: Not on file    Attends meetings of clubs or organizations: Not on file    Relationship status: Not on file  . Intimate partner violence:    Fear of current or ex partner: Not on file    Emotionally abused: Not on file    Physically abused: Not on file    Forced sexual activity: Not on file  Other Topics Concern  . Not on file  Social History Narrative   ** Merged History Encounter **        Review of Systems:  All other review of systems negative except as mentioned in the HPI.  Physical Exam: Vital signs in last 24 hours: Temp:  [97.8 F (36.6 C)] 97.8 F (36.6 C) (02/03 0732) Pulse Rate:  [106] 106 (02/03 0732) Resp:  [10] 10 (02/03 0732) BP: (175)/(81) 175/81 (02/03 0732) SpO2:  [97 %] 97 % (02/03 0732) Weight:  [90.7 kg] 90.7 kg (02/03 0732)   General:   Alert,   pleasant and cooperative in NAD Lungs:  Clear throughout to auscultation.   Heart:  Regular rate and rhythm; no murmurs, clicks, rubs,  or gallops. Abdomen:  Soft, distended. Normal bowel sounds.   Neuro/Psych:  Alert and cooperative. Normal mood and affect. A and O x 3   K. Denzil Magnuson , MD [phone]

## 2018-06-12 NOTE — Op Note (Signed)
[facility] Patient Name: [name] Procedure Date: 06/12/2018 MRN: [id number] Attending MD: Mauri Pole , MD Date of Birth: [birth year] CSN: [id number] Age: 63 Admit Type: Outpatient Procedure:                Upper GI endoscopy Indications:              To evaluate esophageal varices in patient with                            suspected portal hypertension. Cirrhosis                            decompensated with ascites. Hepatocellular                            carcinoma. Providers:                Mauri Pole, MD, Cleda Daub, RN, Cletis Athens, Technician, Arnoldo Hooker, CRNA Referring MD:              Medicines:                Monitored Anesthesia Care Complications:            No immediate complications. Estimated Blood Loss:     Estimated blood loss was minimal. Procedure:                Pre-Anesthesia Assessment:                           - Prior to the procedure, a History and Physical                            was performed, and patient medications and                            allergies were reviewed. The patient's tolerance of                            previous anesthesia was also reviewed. The risks                            and benefits of the procedure and the sedation                            options and risks were discussed with the patient.                            All questions were answered, and informed consent                            was obtained. Prior Anticoagulants: The patient has                            taken no previous  anticoagulant or antiplatelet                            agents. ASA Grade Assessment: IV - A patient with                            severe systemic disease that is a constant threat                            to life. After reviewing the risks and benefits,                            the patient was deemed in satisfactory condition to                            undergo  the procedure.                           After obtaining informed consent, the endoscope was                            passed under direct vision. Throughout the                            procedure, the patient's blood pressure, pulse, and                            oxygen saturations were monitored continuously. The                            GIF-H190 ([id number]) Olympus gastroscope was                            introduced through the mouth, and advanced to the                            second part of duodenum. The upper GI endoscopy was                            accomplished without difficulty. The patient                            tolerated the procedure well. Findings:      Grade I , trace varices were found in the lower third of the esophagus.       They were less than 1 mm in largest diameter.      LA Grade B (one or more mucosal breaks greater than 5 mm, not extending       between the tops of two mucosal folds) esophagitis was found 34 to 36 cm       from the incisors.      A small hiatal hernia was present.      Patchy mild inflammation characterized by congestion (edema), erythema       and friability was found in the entire examined stomach. No gastric  varices.      The examined duodenum was normal. Impression:               - Grade I esophageal varices.                           - LA Grade B reflux esophagitis.                           - Small hiatal hernia.                           - Gastritis.                           - Normal examined duodenum.                           - No specimens collected. Moderate Sedation:      Not Applicable - Patient had care per Anesthesia. Recommendation:           - Patient has a contact number available for                            emergencies. The signs and symptoms of potential                            delayed complications were discussed with the                            patient. Return to normal activities tomorrow.                             Written discharge instructions were provided to the                            patient.                           - Resume previous diet.                           - Continue present medications.                           - Use Protonix (pantoprazole) 40 mg PO daily.                           - Follow an antireflux regimen.                           - Continue Nadolol 81m daily with dosage titrated                            by the heart rate.                           - Follow up in GI office  in 1-2 weeks (OK to                            schedule with APP/extender)                           - Contiue Lasix 50m daily and increase                            Spiranolactone to 104mdaily.                           - Follow up BMP next week, Feb 10th, Monday                           - Follow up with Oncology [facility] Intervention                            [facility]):        --- Professional ---                           [id number]py, flexible,                            transoral; diagnostic, including collection of                            specimen(s) by brushing or washing, when performed                            (separate procedure) Diagnosis Code(s):        --- Professional ---                           I85.00, Esophageal varices without bleeding                           K21.0, Gastro-esophageal reflux disease with                            esophagitis                           K44.9, Diaphragmatic hernia without obstruction or                            gangrene                           K29.70, Gastritis, unspecified, without bleeding CPT copyright 2018 American Medical Association. All rights reserved. The codes documented in this report are preliminary and upon coder review may  be revised to meet current compliance requirements. KaMauri PoleMD 06/12/2018 9:23:57  AM This report has been signed electronically. Number of Addenda: 0

## 2018-06-12 NOTE — Anesthesia Preprocedure Evaluation (Signed)
Anesthesia Evaluation  Patient identified by MRN, date of birth, ID band Patient awake    Reviewed: Allergy & Precautions, NPO status , Patient's Chart, lab work & pertinent test results  Airway Mallampati: II  TM Distance: >3 FB Neck ROM: Full    Dental no notable dental hx.    Pulmonary Current Smoker,    breath sounds clear to auscultation + decreased breath sounds      Cardiovascular negative cardio ROS Normal cardiovascular exam Rhythm:Regular Rate:Normal     Neuro/Psych negative neurological ROS  negative psych ROS   GI/Hepatic negative GI ROS, (+)     substance abuse  alcohol use, Hepatitis -, C  Endo/Other  diabetes, Insulin Dependent  Renal/GU negative Renal ROS  negative genitourinary   Musculoskeletal negative musculoskeletal ROS (+)   Abdominal   Peds negative pediatric ROS (+)  Hematology  (+) anemia ,   Anesthesia Other Findings   Reproductive/Obstetrics negative OB ROS                             Anesthesia Physical Anesthesia Plan  ASA: III  Anesthesia Plan: MAC   Post-op Pain Management:    Induction: Intravenous  PONV Risk Score and Plan: 0  Airway Management Planned: Nasal Cannula  Additional Equipment:   Intra-op Plan:   Post-operative Plan:   Informed Consent: I have reviewed the patients History and Physical, chart, labs and discussed the procedure including the risks, benefits and alternatives for the proposed anesthesia with the patient or authorized representative who has indicated his/her understanding and acceptance.     Dental advisory given  Plan Discussed with: CRNA and Surgeon  Anesthesia Plan Comments:         Anesthesia Quick Evaluation

## 2018-06-13 ENCOUNTER — Encounter (HOSPITAL_COMMUNITY): Payer: Self-pay | Admitting: Gastroenterology

## 2018-06-13 NOTE — Anesthesia Postprocedure Evaluation (Signed)
Anesthesia Post Note  Patient: [name]  Procedure(s) Performed: ESOPHAGOGASTRODUODENOSCOPY (EGD) WITH PROPOFOL (N/A ) COLONOSCOPY WITH PROPOFOL (N/A ) BIOPSY     Patient location during evaluation: PACU Anesthesia Type: MAC Level of consciousness: awake and alert Pain management: pain level controlled Vital Signs Assessment: post-procedure vital signs reviewed and stable Respiratory status: spontaneous breathing, nonlabored ventilation, respiratory function stable and patient connected to nasal cannula oxygen Cardiovascular status: stable and blood pressure returned to baseline Postop Assessment: no apparent nausea or vomiting Anesthetic complications: no    Last Vitals:  Vitals:   06/12/18 0930 06/12/18 0940  BP: (!) 141/76 (!) 156/81  Pulse: 96 94  Resp: 16 19  Temp:    SpO2: 100% 97%    Last Pain:  Vitals:   06/12/18 0930  TempSrc:   PainSc: 0-No pain                 Shawnta Schlegel S

## 2018-06-14 ENCOUNTER — Encounter: Payer: Self-pay | Admitting: Gastroenterology

## 2018-06-15 ENCOUNTER — Ambulatory Visit: Payer: Self-pay | Admitting: Nurse Practitioner

## 2018-06-15 ENCOUNTER — Ambulatory Visit: Payer: Self-pay | Admitting: Gastroenterology

## 2018-06-20 ENCOUNTER — Telehealth: Payer: Self-pay | Admitting: Oncology

## 2018-06-20 ENCOUNTER — Telehealth: Payer: Self-pay | Admitting: *Deleted

## 2018-06-20 ENCOUNTER — Inpatient Hospital Stay: Payer: Medicaid Other | Attending: Oncology | Admitting: Oncology

## 2018-06-20 VITALS — BP 157/83 | HR 109 | Temp 98.4°F | Resp 19 | Ht 72.0 in | Wt 196.3 lb

## 2018-06-20 DIAGNOSIS — D638 Anemia in other chronic diseases classified elsewhere: Secondary | ICD-10-CM | POA: Insufficient documentation

## 2018-06-20 DIAGNOSIS — C220 Liver cell carcinoma: Secondary | ICD-10-CM

## 2018-06-20 DIAGNOSIS — E1140 Type 2 diabetes mellitus with diabetic neuropathy, unspecified: Secondary | ICD-10-CM | POA: Diagnosis not present

## 2018-06-20 DIAGNOSIS — R918 Other nonspecific abnormal finding of lung field: Secondary | ICD-10-CM | POA: Diagnosis not present

## 2018-06-20 DIAGNOSIS — F17210 Nicotine dependence, cigarettes, uncomplicated: Secondary | ICD-10-CM | POA: Insufficient documentation

## 2018-06-20 DIAGNOSIS — K7460 Unspecified cirrhosis of liver: Secondary | ICD-10-CM | POA: Insufficient documentation

## 2018-06-20 NOTE — Progress Notes (Signed)
Reports significant swelling in his legs, scrotum and abdomen. Was not able to obtain the protonix--cost prohibitive. He lives at [facility] at this time. 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 Says he has the "orange card" through [facility]. His pharmacy where he obtains his mediations did not have protonix on formulary. This RN called and left VM asking what med proton pump inhibitor is on formulary. Patient reports all his meds need to be cleared by Sandrea Hammond, NP.

## 2018-06-20 NOTE — Telephone Encounter (Signed)
Scheduled appt per 2/11 los.  Printed calendar and avs.

## 2018-06-20 NOTE — Progress Notes (Signed)
[name]   Requesting MD: [name] Coots, [address] [name], [place] [address]   [name] 63 y.o.  Dec 17, 1955    Reason for [name]: Hepatocellular carcinoma   HPI: Mr. [name] reports progressive leg swelling for 3 to 4 months.  This began while he was being treated for hepatitis C.  He presented to the emergency room on 05/31/2018 with abdominal pain.  A CT of the abdomen and pelvis revealed lower esophageal varices, changes of cirrhosis, and segment 8/segment 6 liver lesions with arterial hyperenhancement and portal phase washout.  The spleen is normal in size.  Mildly enlarged portacaval and left periceliac/posterior peripancreatic nodes.  Small to moderate volume ascites.  4 mm right lower lobe nodule. A CT of the chest 05/31/2018 ~a small pericardial effusion, no hilar or mediastinal adenopathy, biapical pleural blebs, 4 mm right lower lobe nodule.  An MRI of the liver on 05/31/2018 revealed an enhancing mass in segment 8 and a similar mass in segment 6/7.  With masses have rapid contrast washout and delayed capsular enhancement consistent with hepatocellular carcinoma.  An additional 1.8 cm hypervascular lesion is seen in the liver dome junction of segment 7/8.  This lesion had arterial enhancement without significant washout.  Mild porta hepatis lymphadenopathy and portacaval and left periaortic lymphadenopathy.  A paracentesis 06/01/2018 revealed "atypical cells ".  The peritoneal fluid culture was negative.  He was discharged home 06/02/2018 with Aldactone and Lasix.  He has noted significant improvement in leg and scrotal edema since starting this regimen.  Dr. Silverio [name] was consulted and he was taken to an upper and lower endoscopy procedure on 06/12/2018.  He was found to have trace varices in the lower third of the esophagus.  No gastric varices.  Reflux esophagitis and a small hiatal hernia.  There is gastritis.  Lasix was  continued and the Aldactone dose was increased.  The colonoscopy was a 1 mm polyp in the cecum.  The polyp was resected and retrieved.  The pathology revealed a tubular adenoma.  He is scheduled to see interventional radiology 06/22/2018.   Past Medical History:  Diagnosis Date  . Alcoholic ([place])   .  Cirrhosis      . Alcoholism /alcohol abuse ([place])   . Anxiety   . Chronic bronchitis ([place])   . Chronic hyponatremia    pt denies having   . Depression   . Diabetes mellitus   . Diabetes mellitus without complication ([place])   . Hepatitis C   . Hyperglycemia   . Neuropathy   . Substance abuse ([place])   .  History of thrombocytopenia ([place])   . Tobacco use 05/31/2018    Past Surgical History:  Procedure Laterality Date  . BIOPSY  06/12/2018   Procedure: BIOPSY;  Surgeon: [name] Pole, MD;  Location: [facility];  Service: Endoscopy;;  . COLONOSCOPY WITH PROPOFOL N/A 06/12/2018   Procedure: COLONOSCOPY WITH PROPOFOL;  Surgeon: [name] Pole, MD;  Location: [facility];  Service: Endoscopy;  Laterality: N/A;  . ESOPHAGOGASTRODUODENOSCOPY (EGD) WITH PROPOFOL N/A 06/12/2018   Procedure: ESOPHAGOGASTRODUODENOSCOPY (EGD) WITH PROPOFOL;  Surgeon: [name] Pole, MD;  Location: [facility];  Service: Endoscopy;  Laterality: N/A;  . FRACTURE SURGERY  right lower leg plate placed years ago  . Plate in lower right leg  2008    Medications: Reviewed  Allergies:  Allergies  Allergen Reactions  . Benadryl [Diphenhydramine Hcl] Other (See Comments)    "Causes  nervousness"  . Benadryl [Diphenhydramine] Other (See Comments)    "makes my skin crawl"  . Sulfa Antibiotics Other (See Comments)    Childhood allergy  . Tegretol [Carbamazepine] Other (See Comments)    "almost killed me"  . Librium [Chlordiazepoxide Hcl] Anxiety  . Librium [Chlordiazepoxide] Anxiety  . Sulfa Antibiotics Rash    Family history: No family history of cancer  Social History:   He lives in a homeless  shelter.  He is a Curator.  He is currently unable to work secondary to his medical condition.  He smokes cigarettes.  He quit drinking alcohol 3 years ago.  He used IV drugs 40 years ago.  None at present.  No risk factor for HIV.  No transfusion history.  ROS:   Positives include: Cough 2 months ago treated with a prednisone taper, leg and scrotal edema-improved since beginning diuretics, anorexia, diarrhea each morning  A complete ROS was otherwise negative.  Physical Exam:  Blood pressure (!) 157/83, pulse (!) 109, temperature 98.4 F (36.9 C), temperature source Oral, resp. rate 19, height 6' (1.829 m), weight 196 lb 4.8 oz (89 kg), SpO2 99 %.  HEENT: Upper and lower denture plate, oropharynx without visible mass, neck without mass Lungs: Decreased breath sounds with inspiratory rhonchi at the posterior bases bilaterally, no respiratory distress Cardiac: Tachycardia, regular rate and rhythm Abdomen: Stented with ascites, nontender, no mass GU: Marked scrotal edema, testes not palpable Vascular: Pitting edema throughout the leg bilaterally Lymph nodes: No cervical, supraclavicular, or inguinal nodes.  "Shotty "bilateral axillary nodes Neurologic: Alert and oriented, the motor exam appears intact in the upper and lower extremities bilaterally Skin: No rash Musculoskeletal: No spine tenderness   LAB:  CBC  Lab Results  Component Value Date   WBC 12.4 (H) 06/02/2018   HGB 9.6 (L) 06/02/2018   HCT 29.1 (L) 06/02/2018   MCV 96.0 06/02/2018   PLT 316 06/02/2018   NEUTROABS 7.1 06/02/2018        CMP  Lab Results  Component Value Date   NA 133 (L) 06/02/2018   K 4.1 06/02/2018   CL 98 06/02/2018   CO2 29 06/02/2018   GLUCOSE 97 06/02/2018   BUN 12 06/02/2018   CREATININE 0.54 (L) 06/02/2018   CALCIUM 7.6 (L) 06/02/2018   PROT 6.4 (L) 06/01/2018   ALBUMIN 1.6 (L) 06/01/2018   AST 50 (H) 06/01/2018   ALT 22 06/01/2018   ALKPHOS 130 (H) 06/01/2018   BILITOT 0.6  06/01/2018   GFRNONAA >60 06/02/2018   GFRAA >60 06/02/2018     Lab Results  Component Value Date   CEA1 8.7 (H) 05/31/2018  AFP on 05/31/2018: 4.2  Imaging: CT abdomen/pelvis 05/31/2018 and MRI liver 05/31/2018-reviewed images with [name] and his former wife     Assessment/Plan:   1. Hepatocellular carcinoma  CT abdomen/pelvis 05/31/2018- cirrhosis, segment 8 and in segment 6 hyperenhancing lesion suspicious for multifocal hepatocellular carcinoma, ascites, mild upper retroperitoneal adenopathy-reactive versus metastatic, 4 mm right lower lobe nodule  CT hest 05/31/2018- 4 mm right lower lobe nodule  MRI liver 05/31/2018-cirrhosis, 2 right hepatic lesions definitive for hepatocellular carcinoma, third hypervascular liver dome lesion likely hepatocellular carcinoma, mild porta hepatis and retroperitoneal lymphadenopathy suspicious for metastatic disease, ascites  Normal AFP 2. 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 Hepatitis C treated with Epclusa beginning September 2019, HCV not detected 06/01/2018 4. Anemia secondary to chronic disease and potentially GI bleeding 5. Diabetes 6. Diabetic neuropathy 7. Anasarca  secondary to cirrhosis and hypoalbuminemia 8. Right lung nodule noted on chest CT and CT abdomen/pelvis 05/31/2018   Disposition:   Mr. [place] appears to have multifocal hepatocellular carcinoma in the setting of advanced cirrhosis.  He is currently being treated for decompensated cirrhosis.  Anasarca has improved since beginning furosemide and coronal lactone.  He will continue these medications and follow-up with Dr. Silverio [name].  I discussed the hepatocellular carcinoma diagnosis, reviewed the CT/MRI images, and discussed treatment options with Mr. Keown.  He does not appear to be a surgical or transplant candidate.  I will present his case at the GI tumor conference to discuss options including hepatic directed therapy and systemic therapy.  He does not  appear symptomatic from the hepatocellular carcinoma at present.  We recommended he continue efforts to obtain disability and Medicaid.  He will follow-up with interventional radiology 06/22/2018.  He will return for an office visit here in approximately 2 weeks.    Betsy Coder, MD  06/20/2018, 3:48 PM

## 2018-06-20 NOTE — Telephone Encounter (Signed)
Return call from [facility] with pharmacy: She reports patient can receive the pantoprazole 40 mg for free at the [facility] on [address] She can see that he picked up and signed for #30 pills on 06/15/18. To continue to receive these for free it must be approved by Dudley Major, NP. Her assistant is Clayborne Dana [phone] ext [phone]). She can tell us how to order the script be continued.  Dr. [name] notified.

## 2018-06-21 NOTE — Progress Notes (Signed)
Spoke with Dudley Major NP at [facility]. She explained that patient is aware that he needs to ask for a refill of his medications from [facility] and they will contact her to approve. It is usually approved if patient has a future appointment with her. No barrier to obtaining medications identified.  Patient reports having a car and that his ex-wife helps with gas. He is staying at the shelter during the day. I spoke with social work and patient can apply for Medicaid through [place]. Patient is working with an attorney to obtain disability.  No barriers to treatment identified.

## 2018-06-22 ENCOUNTER — Other Ambulatory Visit: Payer: Self-pay

## 2018-06-29 ENCOUNTER — Ambulatory Visit
Admission: RE | Admit: 2018-06-29 | Discharge: 2018-06-29 | Disposition: A | Discharge status: Home / Self Care | Payer: Self-pay | Source: Ambulatory Visit | Attending: Physician Assistant | Admitting: Physician Assistant

## 2018-06-29 ENCOUNTER — Telehealth: Payer: Self-pay | Admitting: Gastroenterology

## 2018-06-29 DIAGNOSIS — K7689 Other specified diseases of liver: Secondary | ICD-10-CM

## 2018-06-29 HISTORY — PX: IR RADIOLOGIST EVAL & MGMT: IMG5224

## 2018-06-29 NOTE — Telephone Encounter (Signed)
Ms. Arbie Cookey called on behalf of pt, she wanted to r/s pt's appt for tomorrow with Dr. Silverio Decamp. Unfortunately, there is nothing until April. Ms Arbie Cookey stated that pt needs to be seeing asap and is requesting if pt can be squeezed in either Monday or Tuesday. Pls call her.

## 2018-06-29 NOTE — Consult Note (Signed)
Chief Complaint: Patient was seen in consultation today for  Chief Complaint  Patient presents with  . Consult    Consult for Hepatocellular Carcinoma      Referring Physician(s): Dr. Sherrill/Dr. Silverio [name]  Supervising Physician: [name]  History of Present Illness: [name] is a 63 y.o. male with past medical history significant for anxiety, depression, ETOH abuse (last drink ~3 years ago), substance abuse (remote), current tobacco abuse (~1/2 PPD), DM on insulin, neuropathy, recently treated hepatitis C, ETOH cirrhosis and likely [place] followed by Dr. Benay [name] who presents today for consultation regarding IR options for [name] treatment. [name] [name] on [facility]/18 with complaints generalized fatigue, LLQ pain, abdominal swelling, nausea, sweating and dizziness for about 8 hours. CT abdomen/pelvis with contrast showed two hyperenhancing liver masses highly suspicious for HCC, small to moderate ascites, moderate esophageal varices, mild upper retroperitoneal adenopathy and right lower lobe 4 mm solid pulmonary nodule. MRI of the liver with and without contrast was performed for further evaluation which showed hepatic cirrhosis, two right hepatic lobe masses definitely HCC (LI-RADS 5), third hypervascular lesion in the liver dome - likely HCC, mild porta hepatis and retroperitoneal lymphadenopathy suspicious for metastatic disease. He was also found to have leukocytosis. He was admitted for further evaluation and management.  GI was consulted who recommended diagnostic paracentesis, AFP and EGD for esophageal varices screening. He underwent paracentesis in IR on 06/01/18 which was negative for SBP, ascitic fluid showed atypical cells. He was started on aldactone and Lasix with follow up planned with GI for outpatient EGD/colonoscopy. He was [facility] from [name] on 06/02/18. He underwent EGD/colonoscopy on 06/12/18 which showed grade I esophageal varices, LA  grade B reflux esophagitis, small hiatal hernia, one 1 mm polyp in the cecum which was resected and non-bleeding internal hemorrhoids. He reports to me today that is scheduled to see Dr. Silverio [name] tomorrow to discuss these results.   He was seen by Dr. Benay [name] on 06/20/18 where his [name] diagnosis was discussed, images were reviewed and treatment options were discussed. He did not appear to be a surgical or transplant candidate. There are plans to present his case at GI tumor conference.    Patient presents today with his wife, they state they were told to come in today to "talk about fixing my liver." They both state they were unaware that the patient likely has [place], they were under the impression he had "really bad cirrhosis." Patient reports that he has nausea without vomiting daily and because of that he has been unable to eat very much, he is unsure if he has lost weight but he doesn't think he has, if anything he thinks he has gained weight from fluid retention. His lower extremities are very swollen and he has a hard time wearing shoes or socks. He does not weigh himself at home. He states he has had diabetes for >10 years and is currently on insulin in the morning and metformin twice a day - he checks his blood sugar once a day and it is typically 120-140. He states he has neuropathy which makes it hard for him to walk or participate in most activities that he used to. He also states that he has "screws in my right foot" that often cause him pain and if he hits his ankle on something it causes him to be bedridden for days. He states daily large volume diarrhea that is brown - he denies any hematochezia, melena, pale or  strangely colored stools. When he has diarrhea his stomach hurts very badly but is relieved once he has a bowel movement. He reports very frequent urination since starting Lasix - he states that he urinates so many times at night that he urinates in a bottle at his bedside because it hurts  his feet and legs to get up out of bed that often. He continues to smoke 1/2 PPD of cigarettes, he used to smoke at least 1 PPD for many years and has been making an effort to cut back. He states his last drink was about 3 years ago, he does admit to heavy alcohol use prior to this time - ultimately quitting because he wanted to have his hepatitis C treated and he would often become ill for days after drinking. He denies any drug use.   Past Medical History:  Diagnosis Date  . Alcoholic ([place])   . Alcoholic cirrhosis of liver ([place])    Pt calls this a false diagnosis. Pt denies  . Alcoholism /alcohol abuse ([place])   . Anxiety   . Chronic bronchitis ([place])   . Chronic hyponatremia    pt denies having   . Depression   . Diabetes mellitus   . Diabetes mellitus without complication ([place])   . Hepatitis C   . Hyperglycemia   . Neuropathy   . Substance abuse ([place])   . Thrombocytopenia ([place])   . Tobacco use 05/31/2018    Past Surgical History:  Procedure Laterality Date  . BIOPSY  06/12/2018   Procedure: BIOPSY;  Surgeon: [name] Pole, MD;  Location: [facility];  Service: Endoscopy;;  . COLONOSCOPY WITH PROPOFOL N/A 06/12/2018   Procedure: COLONOSCOPY WITH PROPOFOL;  Surgeon: [name] Pole, MD;  Location: [facility];  Service: Endoscopy;  Laterality: N/A;  . ESOPHAGOGASTRODUODENOSCOPY (EGD) WITH PROPOFOL N/A 06/12/2018   Procedure: ESOPHAGOGASTRODUODENOSCOPY (EGD) WITH PROPOFOL;  Surgeon: [name] Pole, MD;  Location: [facility];  Service: Endoscopy;  Laterality: N/A;  . FRACTURE SURGERY  right lower leg plate placed years ago  . Plate in lower right leg  2008    Allergies: Benadryl [diphenhydramine hcl]; Benadryl [diphenhydramine]; Sulfa antibiotics; Tegretol [carbamazepine]; Librium [chlordiazepoxide hcl]; Librium [chlordiazepoxide]; and Sulfa antibiotics  Medications: Prior to Admission medications   Medication Sig Start Date End Date Taking? Authorizing Provider    albuterol (PROVENTIL HFA;VENTOLIN HFA) 108 (90 Base) MCG/ACT inhaler Inhale 2 puffs into the lungs every 6 (six) hours as needed for wheezing or shortness of breath.   Yes [provider]  furosemide (LASIX) 40 MG tablet Take 1 tablet (40 mg total) by mouth daily. Patient taking differently: Take 40 mg by mouth every evening.  03/22/18  Yes Yu, Amy V, PA-C  gabapentin (NEURONTIN) 400 MG capsule Take 3 capsules (1,200 mg total) by mouth 3 (three) times daily for 7 days. Patient taking differently: Take 1,600 mg by mouth 3 (three) times daily.  06/02/18 06/29/18 Yes Mercy Riding, MD  insulin glargine (LANTUS) 100 UNIT/ML injection Inject 0.3 mLs (30 Units total) into the skin daily. Patient taking differently: Inject 25 Units into the skin daily.  06/02/18  Yes Mercy Riding, MD  metFORMIN (GLUCOPHAGE) 1000 MG tablet Take 1 tablet (1,000 mg total) by mouth 2 (two) times daily with a meal. 11/01/15  Yes Lord, Asa Saunas, NP  nadolol (CORGARD) 20 MG tablet Take 1 tablet (20 mg total) by mouth daily. 06/12/18  Yes Nandigam, Venia Minks, MD  pantoprazole (PROTONIX) 40 MG tablet Take 1  tablet (40 mg total) by mouth daily. 06/12/18  Yes Nandigam, Venia Minks, MD  spironolactone (ALDACTONE) 50 MG tablet Take 2 tablets (100 mg total) by mouth daily. Patient not taking: Reported on 06/29/2018 06/12/18   [name] Pole, MD     Family History  Problem Relation Age of Onset  . ALS Father   . Colon cancer Neg Hx   . Colon polyps Neg Hx   . Esophageal cancer Neg Hx   . Rectal cancer Neg Hx   . Stomach cancer Neg Hx     Social History   Socioeconomic History  . Marital status: Divorced    Spouse name: Not on file  . Number of children: Not on file  . Years of education: Not on file  . Highest education level: Not on file  Occupational History  . Not on file  Social Needs  . Financial resource strain: Not on file  . Food insecurity:    Worry: Not on file    Inability: Not on file  .  Transportation needs:    Medical: Not on file    Non-medical: Not on file  Tobacco Use  . Smoking status: Current Every Day Smoker    Packs/day: 0.50    Years: 40.00    Pack years: 20.00    Types: Cigarettes  . Smokeless tobacco: Never Used  Substance and Sexual Activity  . Alcohol use: Not Currently    Comment: 19/daily- reports has not drink in 2 1/2years  . Drug use: Yes    Frequency: 3.0 times per week    Types: Marijuana    Comment: for sleep; 2-3 times per week per pt  . Sexual activity: Not Currently  Lifestyle  . Physical activity:    Days per week: Not on file    Minutes per session: Not on file  . Stress: Not on file  Relationships  . Social connections:    Talks on phone: Not on file    Gets together: Not on file    Attends religious service: Not on file    Active member of club or organization: Not on file    Attends meetings of clubs or organizations: Not on file    Relationship status: Not on file  Other Topics Concern  . Not on file  Social History Narrative   ** Merged History Encounter **        ECOG Status: 3 - Symptomatic, >50% confined to bed  Review of Systems: A 12 point ROS discussed and pertinent positives are indicated in the HPI above.  All other systems are negative.  Review of Systems  Constitutional: Positive for activity change, appetite change and fatigue. Negative for chills, fever and unexpected weight change.  Respiratory: Positive for cough (chronic; mostly in the mornings - sometimes productive of white or yellow sputum), shortness of breath (with exertion) and wheezing.   Cardiovascular: Positive for leg swelling. Negative for chest pain.  Gastrointestinal: Positive for abdominal distention, abdominal pain (with bowel movements), diarrhea and nausea. Negative for blood in stool, constipation and vomiting.  Genitourinary: Positive for frequency (since starting Lasix). Negative for dysuria and hematuria.  Musculoskeletal: Positive  for gait problem (pain in both legs; neuropathy).  Skin: Negative for color change.  Neurological: Positive for dizziness (sometimes when standing up quickly) and numbness (sometimes BLE). Negative for syncope and headaches.  Psychiatric/Behavioral: Negative for confusion. The patient is nervous/anxious.     Vital Signs: BP 139/74   Pulse 94  Temp 97.9 F (36.6 C) (Oral)   Resp 14   Ht 6' (1.829 m)   Wt 195 lb (88.5 kg)   SpO2 98%   BMI 26.45 kg/m   Physical Exam Vitals signs reviewed.  Constitutional:      General: He is not in acute distress.    Appearance: He is ill-appearing.     Comments: Appears older than stated age, cachectic. Pleasant, talkative, somewhat of a poor historian at times. Wife present during exam and is able to provide supplemental history.  Eyes:     General: No scleral icterus. Cardiovascular:     Rate and Rhythm: Normal rate and regular rhythm.  Pulmonary:     Effort: Pulmonary effort is normal.     Breath sounds: Wheezing (bilaterally) present.  Abdominal:     General: Bowel sounds are normal. There is no distension.     Palpations: Abdomen is soft.     Tenderness: There is no abdominal tenderness.  Skin:    General: Skin is warm and dry.     Coloration: Skin is not jaundiced.  Neurological:     Mental Status: He is alert and oriented to person, place, and time.  Psychiatric:        Mood and Affect: Mood normal.        Behavior: Behavior normal.        Thought Content: Thought content normal.        Judgment: Judgment normal.     Mallampati Score:     Imaging: Ct Chest Wo Contrast  Result Date: 05/31/2018 CLINICAL DATA:  63 year old male with cough. Nodule was seen on the CT of the abdomen pelvis. EXAM: CT CHEST WITHOUT CONTRAST TECHNIQUE: Multidetector CT imaging of the chest was performed following the standard protocol without IV contrast. COMPARISON:  CT of the abdomen pelvis dated 05/31/2018 and chest radiograph dated 05/12/2018  FINDINGS: Evaluation of this exam is limited in the absence of intravenous contrast. Cardiovascular: There is no cardiomegaly. There is a small pericardial effusion measuring 8 mm anterior to the heart. There is multi vessel coronary vascular calcification as well as calcification of the mitral annulus. There is mild atherosclerotic calcification of the thoracic aorta. The central pulmonary arteries are grossly unremarkable on this noncontrast CT. Mediastinum/Nodes: There is no hilar or mediastinal adenopathy. The esophagus and the thyroid gland are grossly unremarkable as visualized. No mediastinal fluid collection. Lungs/Pleura: There are biapical subpleural blebs. There is a 4 mm nodule in the right lower lobe (series 5, image 93). There is no focal consolidation, pleural effusion, or pneumothorax. An accessory azygos fissure is noted. The central airways are patent. Upper Abdomen: There is morphologic changes of cirrhosis. There is a partially visualized small ascites and upper abdominal and paraesophageal varices. Musculoskeletal: There is mild diffuse subcutaneous edema. The osseous structures are intact. IMPRESSION: 1. No acute intrathoracic pathology. 2. A 4 mm right lower lobe pulmonary nodule No follow-up needed if patient is low-risk. Non-contrast chest CT can be considered in 12 months if patient is high-risk. This recommendation follows the consensus statement: Guidelines for Management of Incidental Pulmonary Nodules Detected on CT Images: From the Fleischner Society 2017; Radiology 2017; 284:228-243. 3. Cirrhosis, ascites, and upper abdominal varices. Electronically Signed   By: Anner Crete M.D.   On: 05/31/2018 19:21   Ct Abdomen Pelvis W Contrast  Result Date: 05/31/2018 CLINICAL DATA:  History of hepatitis C cirrhosis. Nausea. Abdominal pain and distention. Constipation. EXAM: CT ABDOMEN AND PELVIS WITH CONTRAST TECHNIQUE:  Multidetector CT imaging of the abdomen and pelvis was performed  using the standard protocol following bolus administration of intravenous contrast. CONTRAST:  174m ISOVUE-300 IOPAMIDOL (ISOVUE-300) INJECTION 61% COMPARISON:  01/24/2018 abdominal sonogram. FINDINGS: Lower chest: Mild hypoventilatory changes at the dependent lung bases. Limited visualization of a right lower lobe 4 mm pulmonary nodule (series 8/image 1). Moderate lower esophageal varices. Coronary atherosclerosis. Hepatobiliary: Diffusely irregular liver surface compatible with hepatic cirrhosis. Peripheral segment 8 right liver lobe 2.3 x 2.2 cm mass (series 2/image 24) demonstrates arterial hyperenhancement and portal phase washout. Posterior segment 6 right liver lobe 2.0 x 1.8 cm mass (series 2/image 39) also demonstrates arterial hyperenhancement and portal phase washout. A few scattered coarse subcentimeter right liver lobe granulomatous calcifications. No additional liver lesions. Distended gallbladder (5.8 cm diameter). Mild diffuse gallbladder wall thickening. No radiopaque cholelithiasis. No biliary ductal dilatation. Pancreas: Normal, with no mass or duct dilation. Spleen: Normal size. No mass. Adrenals/Urinary Tract: Normal adrenals. Normal kidneys with no hydronephrosis and no renal mass. Normal bladder. Stomach/Bowel: Normal non-distended stomach. Normal caliber small bowel with no small bowel wall thickening. Normal appendix. Normal large bowel with no diverticulosis, large bowel wall thickening or pericolonic fat stranding. Vascular/Lymphatic: Atherosclerotic nonaneurysmal abdominal aorta. Patent portal, splenic, hepatic and renal veins. Mildly enlarged 1.2 cm portacaval node (series 4/image 45). Mildly enlarged 1.2 cm left para celiac node (series 4/image 48). Mildly enlarged posterior peripancreatic nodes up to 1.2 cm (series 4/image 54). No pathologically enlarged pelvic nodes. Reproductive: Mildly enlarged prostate with nonspecific internal prostatic calcifications. Other: No pneumoperitoneum.  Small to moderate volume ascites. No focal fluid collection. Mild anasarca. Musculoskeletal: No aggressive appearing focal osseous lesions. Mild thoracolumbar spondylosis. IMPRESSION: 1. Hepatic cirrhosis. 2. Two hyperenhancing liver masses (2.3 cm in segment 8 right liver lobe and 2.0 cm in segment 6 right liver lobe) with features highly suspicious for multifocal hepatocellular carcinoma. MRI abdomen without and with IV contrast is indicated for further characterization. 3. Small to moderate volume ascites. Moderate esophageal varices. Mild anasarca. 4. Nonspecific mild diffuse gallbladder wall thickening without radiopaque cholelithiasis, potentially due to noninflammatory edema. 5. Mild upper retroperitoneal adenopathy is nonspecific and could be reactive or metastatic. CT abdomen with IV contrast follow-up recommended in 3 months. 6. Right lower lobe 4 mm solid pulmonary nodule. Dedicated chest CT recommended for further evaluation. 7. Mild prostatomegaly. 8.  Aortic Atherosclerosis (ICD10-I70.0). Electronically Signed   By: JIlona SorrelM.D.   On: 05/31/2018 11:49   Mr Liver W WQQContrast  Result Date: 05/31/2018 CLINICAL DATA:  Cirrhosis. Chronic hepatitis-C. Abdominal pain and distention. Liver masses on recent CT. EXAM: MRI ABDOMEN WITHOUT AND WITH CONTRAST TECHNIQUE: Multiplanar multisequence MR imaging of the abdomen was performed both before and after the administration of intravenous contrast. CONTRAST:  9 mL Gadavist COMPARISON:  CT on 05/31/2018 FINDINGS: Lower chest: No acute findings. Hepatobiliary: Hepatic cirrhosis again demonstrated. Mild-to-moderate ascites. A 2.2 x 2.0 cm mass with arterial phase enhancement is seen in segment 8 on image 21/901. Another similar mass is seen in segment 6/7 measuring 3.2 x 3.1 cm on image 32/903. Both of these masses show rapid contrast washout and delayed capsular enhancement, consistent with hepatocellular carcinoma (LI-RADS 5). A 1.8 cm hypervascular  lesion is seen in the liver dome the junction of segment 7/8 on image 18/901. This shows arterial phase hyperenhancement with enhancing capsule rim, but no significant washout. (LI-RADS 4). Gallbladder is unremarkable no evidence of biliary ductal dilatation. Pancreas: No mass or inflammatory changes. Spleen:  Within normal limits in size and appearance. Adrenals/Urinary Tract: No masses identified. No evidence of hydronephrosis. Stomach/Bowel: Visualized portion unremarkable. Vascular/Lymphatic: Mild lymphadenopathy is seen in the porta hepatis, with largest lymph node measuring 12 mm. Mild lymphadenopathy in the aortocaval and left paraaortic regions. Largest lymph node in the left paraaortic region measures 1.4 cm. No evidence of abdominal aortic aneurysm. Other:  None. Musculoskeletal:  No suspicious bone lesions identified. IMPRESSION: Hepatic cirrhosis. Two right hepatic lobe masses are definite hepatocellular carcinoma (LI-RADS 5), and a 3rd hypervascular lesion in the liver dome is probably hepatocellular carcinoma (LIRADS-4). Mild porta hepatis and retroperitoneal lymphadenopathy, suspicious for metastatic disease. Mild-to-moderate ascites. Electronically Signed   By: Earle Gell M.D.   On: 05/31/2018 19:19   US Paracentesis  Result Date: 06/01/2018 INDICATION: Patient with history of cirrhosis, 2 right hepatic lobe masses, abdominal distension, and ascites. Request is made for diagnostic and therapeutic paracentesis. EXAM: ULTRASOUND GUIDED DIAGNOSTIC AND THERAPEUTIC PARACENTESIS MEDICATIONS: 10 mL of 1% lidocaine COMPLICATIONS: None immediate. PROCEDURE: Informed written consent was obtained from the patient after a discussion of the risks, benefits and alternatives to treatment. A timeout was performed prior to the initiation of the procedure. Initial ultrasound scanning demonstrates a small amount of ascites within the right lower abdominal quadrant. The right lower abdomen was prepped and draped in  the usual sterile fashion. 1% lidocaine was used for local anesthesia. Following this, a 19 gauge, 7-cm, Yueh catheter was introduced. An ultrasound image was saved for documentation purposes. The paracentesis was performed. The catheter was removed and a dressing was applied. The patient tolerated the procedure well without immediate post procedural complication. FINDINGS: A total of approximately 700 mL of clear light yellow fluid was removed. Samples were sent to the laboratory as requested by the clinical team. IMPRESSION: Successful ultrasound-guided paracentesis yielding 700 mL of peritoneal fluid. Read by: Earley Abide, PA-C Electronically Signed   By: Jerilynn Mages.  Shick M.D.   On: 06/01/2018 16:12    Labs:  CBC: Recent Labs    03/22/18 1127 05/31/18 0853 06/01/18 0515 06/02/18 0421  WBC  --  17.2* 15.6* 12.4*  HGB 9.9* 10.5* 10.9* 9.6*  HCT 29.0* 31.9* 33.0* 29.1*  PLT  --  399 339 316    COAGS: Recent Labs    05/31/18 0853  INR 1.09    BMP: Recent Labs    02/28/18 1630 03/22/18 1127 05/31/18 0853 06/01/18 0515 06/02/18 0421  NA 132* 133* 131* 133* 133*  K 5.2 4.2 3.8 3.9 4.1  CL 96* 94* 94* 99 98  CO2 31  --  _0 GLUCOSE 365* 237* 60* 44* 97  BUN _1 CALCIUM 8.4*  --  8.2* 8.0* 7.6*  CREATININE 0.58* 0.60* 0.64 0.60* 0.54*  GFRNONAA  --   --  >60 >60 >60  GFRAA  --   --  >60 >60 >60    LIVER FUNCTION TESTS: Recent Labs    01/12/18 1226 02/28/18 1630 05/31/18 0853 06/01/18 0515  BILITOT 0.3 0.3 0.8 0.6  AST 80* 21 43* 50*  ALT 46 _2 ALKPHOS  --   --  127* 130*  PROT 6.4 6.0* 6.8 6.4*  ALBUMIN  --   --  1.7* 1.6*    TUMOR MARKERS: No results for input(s): AFPTM, CEA, CA199, CHROMGRNA in the last 8760 hours.  Assessment:  Patient with recent diagnosis of likely [place] in setting of decompensated ETOH cirrhosis and recently treated hepatitis  C who presents today with his wife to discuss possible IR interventions available to him. He  is followed by Dr. Benay [name] and Dr. Silverio [name] currently and has maintained close follow up thus far. Encouraged to continue to take all medications as prescribed, maintain close follow up with PCP and all specialists.  Please see Dr. Margaretmary Dys attestation for plan.   Electronically Signed: Joaquim Nam PA-C 06/29/2018, 1:40 PM   Please refer to Dr. Margaretmary Dys attestation of this note for management and plan.

## 2018-06-30 ENCOUNTER — Ambulatory Visit: Payer: Self-pay | Admitting: Gastroenterology

## 2018-06-30 ENCOUNTER — Encounter: Payer: Self-pay | Admitting: *Deleted

## 2018-06-30 NOTE — Telephone Encounter (Signed)
Please schedule office visit with app if I don't have any available appointments soon.  Hep C and ETOH cirrhosis, new diagnosis of HCC. Already has appointment with IR and oncology Please send referral to [place] [facility] for evaluation of possible liver transplant (he does not have stable home situation, not sure if it has changed in past few weeks)

## 2018-06-30 NOTE — Telephone Encounter (Signed)
His case will be presented at the tumor board next week.  How can I help this patient? Thanks

## 2018-06-30 NOTE — Telephone Encounter (Signed)
Patient visit for today was cx due to weather, [name] (ex wife) states that he needs to be seen before next Wed/ due to all of his doctors having conference regarding pt's liver cancer and they need this appt office notes.

## 2018-06-30 NOTE — Telephone Encounter (Signed)
Spoke with the care partner, Arbie Cookey.  She thanks me for the call. Agrees to an appointment for the patient on 07/05/18 at 2:00 pm to see APP  Oncology has stated he "does not appear to be a surgical or transplant candidate.

## 2018-07-03 ENCOUNTER — Telehealth: Payer: Self-pay

## 2018-07-03 ENCOUNTER — Other Ambulatory Visit: Payer: Self-pay

## 2018-07-03 DIAGNOSIS — R160 Hepatomegaly, not elsewhere classified: Secondary | ICD-10-CM

## 2018-07-03 NOTE — Telephone Encounter (Signed)
Records for consult to [facility] faxed.  Office notes, labs, imaging and procedure reports. Request for liver transplant evaluation.

## 2018-07-05 ENCOUNTER — Other Ambulatory Visit (INDEPENDENT_AMBULATORY_CARE_PROVIDER_SITE_OTHER): Payer: Medicaid Other

## 2018-07-05 ENCOUNTER — Ambulatory Visit (INDEPENDENT_AMBULATORY_CARE_PROVIDER_SITE_OTHER): Payer: Medicaid Other | Admitting: Physician Assistant

## 2018-07-05 ENCOUNTER — Encounter: Payer: Self-pay | Admitting: Physician Assistant

## 2018-07-05 VITALS — BP 156/82 | HR 107 | Ht 72.0 in | Wt 191.2 lb

## 2018-07-05 DIAGNOSIS — C220 Liver cell carcinoma: Secondary | ICD-10-CM

## 2018-07-05 DIAGNOSIS — K7460 Unspecified cirrhosis of liver: Secondary | ICD-10-CM

## 2018-07-05 DIAGNOSIS — R609 Edema, unspecified: Secondary | ICD-10-CM | POA: Diagnosis not present

## 2018-07-05 DIAGNOSIS — K7290 Hepatic failure, unspecified without coma: Secondary | ICD-10-CM

## 2018-07-05 LAB — CBC WITH DIFFERENTIAL/PLATELET
Basophils Absolute: 0.1 10*3/uL (ref 0.0–0.1)
Basophils Relative: 0.6 % (ref 0.0–3.0)
Eosinophils Absolute: 0.2 10*3/uL (ref 0.0–0.7)
Eosinophils Relative: 1.6 % (ref 0.0–5.0)
HCT: 28.2 % — ABNORMAL LOW (ref 39.0–52.0)
Hemoglobin: 9.7 g/dL — ABNORMAL LOW (ref 13.0–17.0)
Lymphocytes Relative: 14.1 % (ref 12.0–46.0)
Lymphs Abs: 2 10*3/uL (ref 0.7–4.0)
MCHC: 34.2 g/dL (ref 30.0–36.0)
MCV: 93.6 fl (ref 78.0–100.0)
Monocytes Absolute: 1.7 10*3/uL — ABNORMAL HIGH (ref 0.1–1.0)
Monocytes Relative: 11.9 % (ref 3.0–12.0)
Neutro Abs: 10.3 10*3/uL — ABNORMAL HIGH (ref 1.4–7.7)
Neutrophils Relative %: 71.8 % (ref 43.0–77.0)
Platelets: 367 10*3/uL (ref 150.0–400.0)
RBC: 3.01 Mil/uL — ABNORMAL LOW (ref 4.22–5.81)
RDW: 14.2 % (ref 11.5–15.5)
WBC: 14.3 10*3/uL — ABNORMAL HIGH (ref 4.0–10.5)

## 2018-07-05 LAB — COMPREHENSIVE METABOLIC PANEL
ALT: 13 U/L (ref 0–53)
AST: 25 U/L (ref 0–37)
Albumin: 2.2 g/dL — ABNORMAL LOW (ref 3.5–5.2)
Alkaline Phosphatase: 151 U/L — ABNORMAL HIGH (ref 39–117)
BUN: 12 mg/dL (ref 6–23)
CO2: 31 mEq/L (ref 19–32)
Calcium: 8 mg/dL — ABNORMAL LOW (ref 8.4–10.5)
Chloride: 92 mEq/L — ABNORMAL LOW (ref 96–112)
Creatinine, Ser: 0.63 mg/dL (ref 0.40–1.50)
GFR: 129.19 mL/min (ref >60.00)
Glucose, Bld: 223 mg/dL — ABNORMAL HIGH (ref 70–99)
Potassium: 4.2 mEq/L (ref 3.5–5.1)
Sodium: 128 mEq/L — ABNORMAL LOW (ref 135–145)
Total Bilirubin: 0.5 mg/dL (ref 0.2–1.2)
Total Protein: 6.9 g/dL (ref 6.0–8.3)

## 2018-07-05 LAB — PROTIME-INR
INR: 1.3 ratio — ABNORMAL HIGH (ref 0.8–1.0)
PROTHROMBIN TIME: 15.3 s — AB (ref 9.6–13.1)

## 2018-07-05 MED ORDER — SPIRONOLACTONE 50 MG PO TABS: 50.0000 mg | ORAL_TABLET | ORAL | 6 refills | Status: DC

## 2018-07-05 MED ORDER — GABAPENTIN 400 MG PO CAPS: 1600.0000 mg | ORAL_CAPSULE | Freq: Three times a day (TID) | ORAL | 1 refills | Status: DC

## 2018-07-05 MED ORDER — SPIRONOLACTONE 50 MG PO TABS: 100.0000 mg | ORAL_TABLET | ORAL | 6 refills | Status: DC

## 2018-07-05 MED ORDER — SPIRONOLACTONE 50 MG PO TABS: 50.0000 mg | ORAL_TABLET | Freq: Two times a day (BID) | ORAL | 6 refills | Status: DC

## 2018-07-05 MED ORDER — FUROSEMIDE 40 MG PO TABS: 40.0000 mg | ORAL_TABLET | ORAL | 6 refills | Status: DC

## 2018-07-05 NOTE — Progress Notes (Signed)
Subjective:    Patient ID: [name], male    DOB: 1955-08-23, 63 y.o.   MRN: [id number]  HPI [name] is a pleasant 63 year old white male, recently known to Dr. Silverio Decamp who comes in today for follow-up. 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  He completed a course of Epclusa in December 2019 and has had pain viral response. Appendectomy had an admission in January 2020 with anasarca and abdominal pain.  Work-up occluding MRI showed cirrhosis and 2 right hepatic lobe masses consistent with hepatocellular carcinoma and a third hypervascular lesion which is felt indeterminant.  There was mild porta hepatis lymphadenopathy and portacaval and left periaortic lymph and adenopathy Patient did have paracentesis during that admission, no evidence of SBP but did have abnormal cytology revealing "atypical cells" Patient was diuresed during this admission and discharged on Lasix and Aldactone. He underwent EGD with Dr. Rush Landmark on 06/12/2018 which showed grade 1/trace esophageal varices, grade B esophagitis, no gastric varices and evidence of mild portal gastropathy. He also had colonoscopy with finding of a small tubular adenomatous polyp. Patient has since been evaluated by Dr. Julieanne Manson and also had consultation with Dr. Kathlene Cote.  Patient's case was to be discussed at tumor board this week, with decision to be made regarding ablation, and/or systemic therapy.  Patient says he is feeling fairly well currently.  He feels that he is lost at least 10 pounds over the past couple of weeks.  His abdominal distention has improved significantly he has much less peripheral edema.  He has no current complaints of abdominal pain.  Appetite has been okay, says he feels "icky" ,, most of the time, like he is just not well.  He has not had any nausea or vomiting, no fever no melena.  He is ready to proceed with what ever treatment is felt indicated loading  ablation.   Review of Systems Pertinent positive and negative review of systems were noted in the above HPI section.  All other review of systems was otherwise negative.  Outpatient Encounter Medications as of 07/05/2018  Medication Sig  . albuterol (PROVENTIL HFA;VENTOLIN HFA) 108 (90 Base) MCG/ACT inhaler Inhale 2 puffs into the lungs every 6 (six) hours as needed for wheezing or shortness of breath.  . furosemide (LASIX) 40 MG tablet Take 1 tablet (40 mg total) by mouth every morning.  . gabapentin (NEURONTIN) 400 MG capsule Take 4 capsules (1,600 mg total) by mouth 3 (three) times daily for 7 days.  . insulin glargine (LANTUS) 100 UNIT/ML injection Inject 0.3 mLs (30 Units total) into the skin daily. (Patient taking differently: Inject 25 Units into the skin daily. )  . metFORMIN (GLUCOPHAGE) 1000 MG tablet Take 1 tablet (1,000 mg total) by mouth 2 (two) times daily with a meal.  . nadolol (CORGARD) 20 MG tablet Take 1 tablet (20 mg total) by mouth daily.  Marland Kitchen spironolactone (ALDACTONE) 50 MG tablet Take 1 tablet (50 mg total) by mouth 2 (two) times daily.  . [DISCONTINUED] furosemide (LASIX) 40 MG tablet Take 1 tablet (40 mg total) by mouth daily. (Patient taking differently: Take 40 mg by mouth every evening. )  . [DISCONTINUED] furosemide (LASIX) 40 MG tablet Take 1 tablet (40 mg total) by mouth every morning.  . [DISCONTINUED] gabapentin (NEURONTIN) 400 MG capsule Take 3 capsules (1,200 mg total) by mouth 3 (three) times daily for 7 days. (Patient taking differently: Take 1,600 mg by mouth 3 (three) times daily. )  . [  DISCONTINUED] gabapentin (NEURONTIN) 400 MG capsule Take 4 capsules (1,600 mg total) by mouth 3 (three) times daily for 7 days.  . [DISCONTINUED] spironolactone (ALDACTONE) 50 MG tablet Take 2 tablets (100 mg total) by mouth daily.  . [DISCONTINUED] spironolactone (ALDACTONE) 50 MG tablet Take 2 tablets (100 mg total) by mouth every morning.  . [DISCONTINUED] spironolactone  (ALDACTONE) 50 MG tablet Take 2 tablets (100 mg total) by mouth every morning.  . [DISCONTINUED] spironolactone (ALDACTONE) 50 MG tablet Take 1 tablet (50 mg total) by mouth every morning.  . pantoprazole (PROTONIX) 40 MG tablet Take 1 tablet (40 mg total) by mouth daily. (Patient not taking: Reported on 07/05/2018)   No facility-administered encounter medications on file as of 07/05/2018.    Allergies  Allergen Reactions  . Benadryl [Diphenhydramine Hcl] Other (See Comments)    "Causes nervousness"  . Benadryl [Diphenhydramine] Other (See Comments)    "makes my skin crawl"  . Sulfa Antibiotics Other (See Comments)    Childhood allergy  . Tegretol [Carbamazepine] Other (See Comments)    "almost killed me"  . Librium [Chlordiazepoxide Hcl] Anxiety  . Librium [Chlordiazepoxide] Anxiety  . Sulfa Antibiotics Rash   Patient Active Problem List   Diagnosis Date Noted  . Alcoholic cirrhosis of liver with ascites ([place])   . Hepatocellular carcinoma ([place])   . Elevated CEA   . Secondary esophageal varices without bleeding ([place])   . Liver mass 05/31/2018  . Neuropathy 05/31/2018  . Hypomagnesemia 05/31/2018  . Anemia 05/31/2018  . Hypoglycemia 05/31/2018  . Type 2 diabetes mellitus ([place]) 05/31/2018  . Leukocytosis 05/31/2018  . Tobacco use 05/31/2018  . Lung nodule < 6cm on CT 05/31/2018  . Chronic hepatitis C ([place]) 01/11/2018  . Alcohol-induced mood disorder ([place]) 10/31/2015  . Moderate major depression ([place]) 04/21/2015  . Major depressive disorder, single episode, moderate ([place]) 03/10/2015  . Alcohol dependence with uncomplicated withdrawal ([place]) 03/03/2015  . Alcohol abuse   . Suicidal ideation   . Suicidal ideation 11/20/2014  . Anxiousness 09/11/2013  . Substance induced mood disorder ([place]) 06/20/2013  . S/P alcohol detoxification 09/27/2012  . Alcohol dependence ([place]) 01/29/2012  . Type II or unspecified type diabetes mellitus without mention of complication, uncontrolled  11/11/2011  . Thrombocytopenia ([place])   . Hyperglycemia   . Alcoholic ([place])   . Chronic hyponatremia   . Alcohol dependence with acute alcoholic intoxication ([place]) 11/06/2011  . Diabetes mellitus ([place]) 11/06/2011  . Hepatitis C 11/06/2011   Social History   Socioeconomic History  . Marital status: Divorced    Spouse name: Not on file  . Number of children: Not on file  . Years of education: Not on file  . Highest education level: Not on file  Occupational History  . Not on file  Social Needs  . Financial resource strain: Not on file  . Food insecurity:    Worry: Not on file    Inability: Not on file  . Transportation needs:    Medical: Not on file    Non-medical: Not on file  Tobacco Use  . Smoking status: Current Every Day Smoker    Packs/day: 0.50    Years: 40.00    Pack years: 20.00    Types: Cigarettes  . Smokeless tobacco: Never Used  Substance and Sexual Activity  . Alcohol use: Not Currently    Comment: 18/daily- reports has not drink in 2 1/2years  . Drug use: Yes    Frequency: 3.0 times per week  Types: Marijuana    Comment: for sleep; 2-3 times per week per pt  . Sexual activity: Not Currently  Lifestyle  . Physical activity:    Days per week: Not on file    Minutes per session: Not on file  . Stress: Not on file  Relationships  . Social connections:    Talks on phone: Not on file    Gets together: Not on file    Attends religious service: Not on file    Active member of club or organization: Not on file    Attends meetings of clubs or organizations: Not on file    Relationship status: Not on file  . Intimate partner violence:    Fear of current or ex partner: Not on file    Emotionally abused: Not on file    Physically abused: Not on file    Forced sexual activity: Not on file  Other Topics Concern  . Not on file  Social History Narrative   ** Merged History Encounter **        Mr. Carlisi's family history includes ALS in his  father.      Objective:    Vitals:   07/05/18 1359  BP: (!) 156/82  Pulse: (!) 107    Physical Exam; well-developed chronically ill-appearing white male in no acute distress, pleasant, accompanied by his wife.  Weight 191, BMI 25.9.  Weight is down by pounds over the past 2 weeks.  HEENT ;nontraumatic normocephalic EOMI PERRLA sclera are anicteric mucosa moist, Cardiovascular ;rate and rhythm with S1-S2.  Pulmonary; clear bilaterally.  Abdomen; protuberant with non-tense ascites, no definite palpable mass, he is nontender, bowel sounds are present.  Rectal ;exam not done, Extremities; 2+ edema in the lower shins and ankles.  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  Patient completed a course of Epclusa in December 2019 with sustained viral response.  He is not actively drinking over the past several months. He has been diagnosed with a multifocal hepatocellular carcinoma.  It likely has advanced disease with atypical cells noted on cytology with recent paracentesis. Has had initial oncology and interventional radiology consultations, and decisions are to be made later this week regarding directed therapy per IR and/or systemic therapy.  #2 anasarca-Patient has had significant decrease in ascites and peripheral edema.  His baseline weight is somewhere around 180 pounds.  He is at 191 pounds today.  #3 early esophageal varices/grade 1, and portal gastropathy #4 adult onset diabetes mellitus #5 peripheral neuropathy  Plan; Patient has not been following a 2 g sodium diet, he was given a copy of diet today. Now continue Lasix 40 mg p.o. every morning, and Aldactone 50 mg twice daily. Refills were sent today including a refill for gabapentin x1 at patient's request. CBC with differential, c-Met, pro time/INR Patient is  asked to call if his weight gets down to 180 pounds at that time we will reassess dosages of diuretics Have arranged follow-up with Dr. Silverio Decamp in  1 month. Patient knows to call in the interim for any problems. Cadell Gabrielson Genia Harold PA-C 07/05/2018   Cc: Marliss Coots, NP

## 2018-07-05 NOTE — Patient Instructions (Addendum)
If you are age 62 or older, your body mass index should be between 23-30. Your Body mass index is 25.93 kg/m. If this is out of the aforementioned range listed, please consider follow up with your Primary Care Provider.  If you are age 46 or younger, your body mass index should be between 19-25. Your Body mass index is 25.93 kg/m. If this is out of the aformentioned range listed, please consider follow up with your Primary Care Provider.   Your provider has requested that you go to the basement level for lab work before leaving today. Press "B" on the elevator. The lab is located at the first door on the left as you exit the elevator.  We have given you written prescriptions for the following medications : Furosemide Aldactone Gabapentin  You have been given a 2 gram Sodium Diet.  Follow up with Dr. Silverio Decamp the end of March/April.  The schedule is not available at this time.  Please call the office in a couple of weeks to schedule an appointment.  Thank you for choosing me and [facility].   Amy Esterwood, PA-C

## 2018-07-07 ENCOUNTER — Other Ambulatory Visit: Payer: Self-pay

## 2018-07-07 ENCOUNTER — Telehealth: Payer: Self-pay | Admitting: Gastroenterology

## 2018-07-07 ENCOUNTER — Inpatient Hospital Stay (HOSPITAL_BASED_OUTPATIENT_CLINIC_OR_DEPARTMENT_OTHER): Payer: Medicaid Other | Admitting: Nurse Practitioner

## 2018-07-07 ENCOUNTER — Encounter: Payer: Self-pay | Admitting: Nurse Practitioner

## 2018-07-07 ENCOUNTER — Telehealth: Payer: Self-pay | Admitting: Oncology

## 2018-07-07 VITALS — BP 159/87 | HR 104 | Temp 98.3°F | Resp 19 | Ht 72.0 in | Wt 189.5 lb

## 2018-07-07 DIAGNOSIS — C220 Liver cell carcinoma: Secondary | ICD-10-CM

## 2018-07-07 DIAGNOSIS — F17210 Nicotine dependence, cigarettes, uncomplicated: Secondary | ICD-10-CM

## 2018-07-07 DIAGNOSIS — K7460 Unspecified cirrhosis of liver: Secondary | ICD-10-CM | POA: Diagnosis not present

## 2018-07-07 DIAGNOSIS — D638 Anemia in other chronic diseases classified elsewhere: Secondary | ICD-10-CM | POA: Diagnosis not present

## 2018-07-07 DIAGNOSIS — E1140 Type 2 diabetes mellitus with diabetic neuropathy, unspecified: Secondary | ICD-10-CM

## 2018-07-07 DIAGNOSIS — R918 Other nonspecific abnormal finding of lung field: Secondary | ICD-10-CM | POA: Diagnosis not present

## 2018-07-07 NOTE — Telephone Encounter (Signed)
Ok thanks

## 2018-07-07 NOTE — Progress Notes (Addendum)
  [facility] OFFICE PROGRESS NOTE   Diagnosis: Hepatocellular carcinoma  INTERVAL HISTORY:   Mr. [name] returns as scheduled.  He feels the abdomen is less bloated.  He denies abdominal pain.  No nausea or vomiting.  He has a good appetite.  Neuropathy pain is unchanged.  Objective:  Vital signs in last 24 hours:  Blood pressure (!) 159/87, pulse (!) 104, temperature 98.3 F (36.8 C), temperature source Oral, resp. rate 19, height 6' (1.829 m), weight 189 lb 8 oz (86 kg), SpO2 99 %.    HEENT: No thrush or ulcers. Resp: Lungs clear bilaterally. Cardio: Regular rate and rhythm. GI: Abdomen distended consistent with ascites. Vascular: Trace edema at the lower legs bilaterally. Neuro: Alert and oriented.   Lab Results:  Lab Results  Component Value Date   WBC 14.3 (H) 07/05/2018   HGB 9.7 (L) 07/05/2018   HCT 28.2 (L) 07/05/2018   MCV 93.6 07/05/2018   PLT 367.0 07/05/2018   NEUTROABS 10.3 (H) 07/05/2018    Imaging:  No results found.  Medications: I have reviewed the patient's current medications.  Assessment/Plan: 1. Hepatocellular carcinoma ? CT abdomen/pelvis 05/31/2018- cirrhosis, segment 8 and in segment 6 hyperenhancing lesion suspicious for multifocal hepatocellular carcinoma, ascites, mild upper retroperitoneal adenopathy-reactive versus metastatic, 4 mm right lower lobe nodule ? CT chest 05/31/2018- 4 mm right lower lobe nodule ? MRI liver 05/31/2018-cirrhosis, 2 right hepatic lesions definitive for hepatocellular carcinoma, third hypervascular liver dome lesion likely hepatocellular carcinoma, mild porta hepatis and retroperitoneal lymphadenopathy suspicious for metastatic disease, ascites ? Normal AFP 2. 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 Hepatitis C treated with Epclusa beginning September 2019, HCV not detected 06/01/2018 4. Anemia secondary to chronic disease and potentially GI bleeding 5. Diabetes 6. Diabetic  neuropathy 7. Anasarca secondary to cirrhosis and hypoalbuminemia 8. Right lung nodule noted on chest CT and CT abdomen/pelvis 05/31/2018  Disposition: Mr. [name] appears stable.  His case was presented at the GI tumor conference.  He has seen Dr. Kathlene Cote.  The plan is to proceed with ablation of both lesions.  Mr. [name] is in agreement.  He will return for a follow-up visit here in 2 months.  Patient seen with Dr. [name].    Ned Card ANP/GNP-BC   07/07/2018  2:18 PM  This was a shared visit with Ned Card.  Mr. [name] has experienced improvement in edema with a diuretic regimen.  His case was presented at the GI tumor conference on 07/05/2018.  The consensus opinion is that he is not a surgical candidate.  Ablation of the liver lesions is recommended.  He is being scheduled for ablation by Dr. Kathlene Cote.  Julieanne Manson, MD

## 2018-07-07 NOTE — Telephone Encounter (Signed)
Gave avs and calendar

## 2018-07-07 NOTE — Telephone Encounter (Signed)
Chelse from [facility] liver care called to follow up on the referral that was put in by Dr. Silverio Decamp for the pt. She stated due to the pt not having any insurance he was not a candidate for transplant at this time. She also advised when she discuss the prices of the other service pt stated he will call back once he gets his medicaid.

## 2018-07-12 ENCOUNTER — Other Ambulatory Visit (HOSPITAL_COMMUNITY): Payer: Self-pay | Admitting: Interventional Radiology

## 2018-07-12 DIAGNOSIS — C220 Liver cell carcinoma: Secondary | ICD-10-CM

## 2018-07-17 ENCOUNTER — Telehealth: Payer: Self-pay | Admitting: *Deleted

## 2018-07-17 DIAGNOSIS — Z006 Encounter for examination for normal comparison and control in clinical research program: Secondary | ICD-10-CM

## 2018-07-17 NOTE — Telephone Encounter (Signed)
[employer]-2018-01- Blood Sample Collection to Evaluate Biomarkers in Subjects with Untreated Solid Tumors: Dr. [name] referred patient for the above study. I called patient, introduced myself and informed him about this study.  Informed patient participation is voluntary and it involves a donation of 5 vials of blood along with some demographic and medical history including tobacco and alcohol history and family cancer history.  Participants are given a $50 [employer] gift card for their participation.  Patient stated he is interested and he can come into [facility] tomorrow at 10 am to meet with research nurse to consent for study and then donate blood for the study.   Thanked patient very much for his time and look forward to meeting him tomorrow morning at 10 am. Gave patient my phone number in case he needs to reschedule.  Foye Spurling, BSN, RN Clinical Research Nurse 07/17/2018 11:11 AM

## 2018-07-18 ENCOUNTER — Inpatient Hospital Stay: Payer: Self-pay

## 2018-07-18 ENCOUNTER — Telehealth: Payer: Self-pay | Admitting: *Deleted

## 2018-07-18 ENCOUNTER — Inpatient Hospital Stay: Payer: Self-pay | Attending: Oncology | Admitting: *Deleted

## 2018-07-18 DIAGNOSIS — Z006 Encounter for examination for normal comparison and control in clinical research program: Secondary | ICD-10-CM

## 2018-07-18 DIAGNOSIS — C220 Liver cell carcinoma: Secondary | ICD-10-CM

## 2018-07-18 LAB — RESEARCH LABS

## 2018-07-18 NOTE — Progress Notes (Signed)
Reviewed and agree with documentation and assessment and plan. Damaris Hippo , MD

## 2018-07-18 NOTE — Research (Signed)
[employer]-2018-01 Blood Sample Collection to Evaluate Biomarkers in Subjects with Untreated Solid Tumors: Met unaccompanied patient prior to his lab appointment today for approximately 20 minutes. Reviewed consent form for protocol version 3.0 , IRB approved 03/07/2018, in it's entirety, including the purpose, risks and possible benefits of this study. Patient was reminded that participation in the study is voluntary and it involves drawing approximately 8 teaspoons of blood and sharing some medical and demographic information. Patient denied any questions. Patient signed and dated the [employer] embedded informed consent form. After pt signed form, this RN spent approximately 5 minutes asking patient family cancer history and his tobacco and alcohol history. Thanked patient for his participation and escorted him to the lab for his blood draw.   Research assistant, Farris Has, supplied research tubes and instructions to draw labs per protocol to lab staff.  Research blood was drawn without difficulty by venipuncture per protocol using Lab Kit #[id number]. Patient tolerated well without any adverse events. [employer] gift card given to patient along with a copy of his signed consent form, a clinical research informational brochure and my business card. Thanked patient very much for his participation in this research study.   Patient meets all eligibility criteria and eligibility was confirmed by research RN, Johney Maine. Dr. [name] also agrees that patient meets the eligibility criteria and none of the exclusion criteria to be enrolled on this study.  Patient will be enrolled on this study and assigned PID [id number] [name], BSN, RN Clinical Research Nurse 07/18/2018 11:57 AM

## 2018-07-18 NOTE — Telephone Encounter (Signed)
Patient has not shown up for his appointment.  Called patient to check on him and he says he tried to call but maybe got the wrong number.  He will be here around 11 am.  Instructed patient to check in out front and registration is supposed to call this nurse when he arrives. Thanked patient for coming in and will see him soon. Notified Kim in Lab that patient is about 1 hour late for lab appointment. Foye Spurling, BSN, RN Clinical Research Nurse 07/18/2018 10:32 AM

## 2018-07-26 ENCOUNTER — Inpatient Hospital Stay (HOSPITAL_COMMUNITY): Admission: RE | Admit: 2018-07-26 | Payer: Self-pay | Source: Ambulatory Visit

## 2018-08-02 ENCOUNTER — Other Ambulatory Visit (HOSPITAL_COMMUNITY): Payer: Self-pay

## 2018-08-04 ENCOUNTER — Other Ambulatory Visit: Payer: Self-pay

## 2018-08-04 ENCOUNTER — Emergency Department (HOSPITAL_COMMUNITY): Payer: Medicaid Other

## 2018-08-04 ENCOUNTER — Emergency Department (HOSPITAL_COMMUNITY)
Admission: EM | Admit: 2018-08-04 | Discharge: 2018-08-05 | Disposition: A | Discharge status: Home / Self Care | Payer: Medicaid Other | Attending: Emergency Medicine | Admitting: Emergency Medicine

## 2018-08-04 ENCOUNTER — Encounter (HOSPITAL_COMMUNITY): Payer: Self-pay

## 2018-08-04 DIAGNOSIS — Z794 Long term (current) use of insulin: Secondary | ICD-10-CM | POA: Insufficient documentation

## 2018-08-04 DIAGNOSIS — Z859 Personal history of malignant neoplasm, unspecified: Secondary | ICD-10-CM | POA: Diagnosis not present

## 2018-08-04 DIAGNOSIS — R109 Unspecified abdominal pain: Secondary | ICD-10-CM | POA: Diagnosis not present

## 2018-08-04 DIAGNOSIS — E1140 Type 2 diabetes mellitus with diabetic neuropathy, unspecified: Secondary | ICD-10-CM | POA: Diagnosis not present

## 2018-08-04 DIAGNOSIS — F17210 Nicotine dependence, cigarettes, uncomplicated: Secondary | ICD-10-CM | POA: Insufficient documentation

## 2018-08-04 DIAGNOSIS — Z79899 Other long term (current) drug therapy: Secondary | ICD-10-CM | POA: Diagnosis not present

## 2018-08-04 DIAGNOSIS — R112 Nausea with vomiting, unspecified: Secondary | ICD-10-CM | POA: Diagnosis not present

## 2018-08-04 DIAGNOSIS — R140 Abdominal distension (gaseous): Secondary | ICD-10-CM | POA: Insufficient documentation

## 2018-08-04 DIAGNOSIS — F1210 Cannabis abuse, uncomplicated: Secondary | ICD-10-CM | POA: Diagnosis not present

## 2018-08-04 HISTORY — DX: Malignant (primary) neoplasm, unspecified: C80.1

## 2018-08-04 LAB — COMPREHENSIVE METABOLIC PANEL
ALT: 19 U/L (ref 0–44)
AST: 36 U/L (ref 15–41)
Albumin: 1.7 g/dL — ABNORMAL LOW (ref 3.5–5.0)
Alkaline Phosphatase: 167 U/L — ABNORMAL HIGH (ref 38–126)
Anion gap: 9 (ref 5–15)
BILIRUBIN TOTAL: 0.4 mg/dL (ref 0.3–1.2)
BUN: 16 mg/dL (ref 8–23)
CO2: 28 mmol/L (ref 22–32)
Calcium: 7.9 mg/dL — ABNORMAL LOW (ref 8.9–10.3)
Chloride: 87 mmol/L — ABNORMAL LOW (ref 98–111)
Creatinine, Ser: 0.81 mg/dL (ref 0.61–1.24)
GFR calc Af Amer: >60 mL/min (ref >60)
Glucose, Bld: 201 mg/dL — ABNORMAL HIGH (ref 70–99)
Potassium: 4.2 mmol/L (ref 3.5–5.1)
Sodium: 124 mmol/L — ABNORMAL LOW (ref 135–145)
TOTAL PROTEIN: 6.7 g/dL (ref 6.5–8.1)

## 2018-08-04 LAB — CBC
HCT: 26.4 % — ABNORMAL LOW (ref 39.0–52.0)
Hemoglobin: 8.6 g/dL — ABNORMAL LOW (ref 13.0–17.0)
MCH: 31.3 pg (ref 26.0–34.0)
MCHC: 32.6 g/dL (ref 30.0–36.0)
MCV: 96 fL (ref 80.0–100.0)
Platelets: 394 10*3/uL (ref 150–400)
RBC: 2.75 MIL/uL — ABNORMAL LOW (ref 4.22–5.81)
RDW: 14.4 % (ref 11.5–15.5)
WBC: 13.6 10*3/uL — ABNORMAL HIGH (ref 4.0–10.5)
nRBC: 0 % (ref 0.0–0.2)

## 2018-08-04 LAB — URINALYSIS, ROUTINE W REFLEX MICROSCOPIC
Bilirubin Urine: NEGATIVE
Glucose, UA: 50 mg/dL — AB
Ketones, ur: NEGATIVE mg/dL
Leukocytes,Ua: NEGATIVE
NITRITE: NEGATIVE
PH: 5 (ref 5.0–8.0)
Protein, ur: 100 mg/dL — AB
SPECIFIC GRAVITY, URINE: 1.018 (ref 1.005–1.030)

## 2018-08-04 LAB — LIPASE, BLOOD: Lipase: 25 U/L (ref 11–51)

## 2018-08-04 LAB — ETHANOL: Alcohol, Ethyl (B): <10 mg/dL (ref <10)

## 2018-08-04 MED ORDER — IOHEXOL 300 MG/ML  SOLN
100.0000 mL | Freq: Once | INTRAMUSCULAR | Status: AC | PRN
  Administered 2018-08-04: 100 mL via INTRAVENOUS

## 2018-08-04 MED ORDER — ONDANSETRON 4 MG PO TBDP: 4.0000 mg | ORAL_TABLET | Freq: Three times a day (TID) | ORAL | 0 refills | Status: DC | PRN

## 2018-08-04 MED ORDER — PROMETHAZINE HCL 25 MG RE SUPP: 25.0000 mg | Freq: Four times a day (QID) | RECTAL | 0 refills | Status: DC | PRN

## 2018-08-04 MED ORDER — ONDANSETRON 8 MG PO TBDP
8.0000 mg | ORAL_TABLET | Freq: Once | ORAL | Status: AC
  Administered 2018-08-04: 8 mg via ORAL
  Filled 2018-08-04: qty 1

## 2018-08-04 MED ORDER — SODIUM CHLORIDE 0.9% FLUSH: 3.0000 mL | Freq: Once | INTRAVENOUS | Status: DC

## 2018-08-04 NOTE — ED Provider Notes (Signed)
[facility] DEPT Provider Note   CSN: [id number] Arrival date & time: 08/04/18  1906    History   Chief Complaint Chief Complaint  Patient presents with  . Abdominal Pain  . Emesis  . Constipation    HPI [name] is a 63 y.o. male.  Pt presents today stating "I thought I was going to die this morning".   Pt states diffuse abd pain, n/v x 1, abdominal distention.  Describes the abd pain as diffuse, achy, non-radiating w/out apparent aggrav/allev factors.  He denies fever, cp, shob, diarrhea, urinary symptoms.   Pt speaking in full sentences w/out any apparent respiratory distress.   The history is provided by the patient.  Emesis  Associated symptoms: abdominal pain   Associated symptoms: no arthralgias, no chills, no diarrhea and no fever   Constipation  Associated symptoms: abdominal pain, nausea and vomiting   Associated symptoms: no diarrhea and no fever     Past Medical History:  Diagnosis Date  . Alcoholic ([place])   . Alcoholic cirrhosis of liver ([place])    Pt calls this a false diagnosis. Pt denies  . Alcoholism /alcohol abuse ([place])   . Anxiety   . Cancer ([place])   . Chronic bronchitis ([place])   . Chronic hyponatremia    pt denies having   . Depression   . Diabetes mellitus   . Diabetes mellitus without complication ([place])   . Hepatitis C   . Hyperglycemia   . Neuropathy   . Substance abuse ([place])   . Thrombocytopenia ([place])   . Tobacco use 05/31/2018    Patient Active Problem List   Diagnosis Date Noted  . Alcoholic cirrhosis of liver with ascites ([place])   . Hepatocellular carcinoma ([place])   . Elevated CEA   . Secondary esophageal varices without bleeding ([place])   . Liver mass 05/31/2018  . Neuropathy 05/31/2018  . Hypomagnesemia 05/31/2018  . Anemia 05/31/2018  . Hypoglycemia 05/31/2018  . Type 2 diabetes mellitus ([place]) 05/31/2018  . Leukocytosis 05/31/2018  . Tobacco use 05/31/2018  . Lung nodule < 6cm on CT 05/31/2018   . Chronic hepatitis C ([place]) 01/11/2018  . Alcohol-induced mood disorder ([place]) 10/31/2015  . Moderate major depression ([place]) 04/21/2015  . Major depressive disorder, single episode, moderate ([place]) 03/10/2015  . Alcohol dependence with uncomplicated withdrawal ([place]) 03/03/2015  . Alcohol abuse   . Suicidal ideation   . Suicidal ideation 11/20/2014  . Anxiousness 09/11/2013  . Substance induced mood disorder ([place]) 06/20/2013  . S/P alcohol detoxification 09/27/2012  . Alcohol dependence ([place]) 01/29/2012  . Type II or unspecified type diabetes mellitus without mention of complication, uncontrolled 11/11/2011  . Thrombocytopenia ([place])   . Hyperglycemia   . Alcoholic ([place])   . Chronic hyponatremia   . Alcohol dependence with acute alcoholic intoxication ([place]) 11/06/2011  . Diabetes mellitus ([place]) 11/06/2011  . Hepatitis C 11/06/2011    Past Surgical History:  Procedure Laterality Date  . BIOPSY  06/12/2018   Procedure: BIOPSY;  Surgeon: Mauri Pole, MD;  Location: [facility];  Service: Endoscopy;;  . COLONOSCOPY WITH PROPOFOL N/A 06/12/2018   Procedure: COLONOSCOPY WITH PROPOFOL;  Surgeon: Mauri Pole, MD;  Location: [facility];  Service: Endoscopy;  Laterality: N/A;  . ESOPHAGOGASTRODUODENOSCOPY (EGD) WITH PROPOFOL N/A 06/12/2018   Procedure: ESOPHAGOGASTRODUODENOSCOPY (EGD) WITH PROPOFOL;  Surgeon: Mauri Pole, MD;  Location: [facility];  Service: Endoscopy;  Laterality: N/A;  . FRACTURE SURGERY  right lower leg plate  placed years ago  . IR RADIOLOGIST EVAL & MGMT  06/29/2018  . Plate in lower right leg  2008        Home Medications    Prior to Admission medications   Medication Sig Start Date End Date Taking? Authorizing Provider  albuterol (PROVENTIL HFA;VENTOLIN HFA) 108 (90 Base) MCG/ACT inhaler Inhale 2 puffs into the lungs every 6 (six) hours as needed for wheezing or shortness of breath.   Yes [provider]  furosemide (LASIX) 40 MG  tablet Take 1 tablet (40 mg total) by mouth every morning. 07/05/18  Yes Esterwood, Amy S, PA-C  gabapentin (NEURONTIN) 400 MG capsule Take 1,600 mg by mouth 3 (three) times daily.   Yes [provider]  insulin glargine (LANTUS) 100 UNIT/ML injection Inject 0.3 mLs (30 Units total) into the skin daily. Patient taking differently: Inject 30 Units into the skin daily after breakfast.  06/02/18  Yes Mercy Riding, MD  metFORMIN (GLUCOPHAGE) 1000 MG tablet Take 1 tablet (1,000 mg total) by mouth 2 (two) times daily with a meal. 11/01/15  Yes Lord, Asa Saunas, NP  spironolactone (ALDACTONE) 50 MG tablet Take 1 tablet (50 mg total) by mouth 2 (two) times daily. 07/05/18  Yes Esterwood, Amy S, PA-C  nadolol (CORGARD) 20 MG tablet Take 1 tablet (20 mg total) by mouth daily. Patient not taking: Reported on 08/04/2018 06/12/18   Mauri Pole, MD  pantoprazole (PROTONIX) 40 MG tablet Take 1 tablet (40 mg total) by mouth daily. Patient not taking: Reported on 07/21/2018 06/12/18   Mauri Pole, MD    Family History Family History  Problem Relation Age of Onset  . ALS Father   . Colon cancer Neg Hx   . Colon polyps Neg Hx   . Esophageal cancer Neg Hx   . Rectal cancer Neg Hx   . Stomach cancer Neg Hx     Social History Social History   Tobacco Use  . Smoking status: Current Every Day Smoker    Packs/day: 0.50    Years: 40.00    Pack years: 20.00    Types: Cigarettes  . Smokeless tobacco: Never Used  Substance Use Topics  . Alcohol use: Not Currently    Comment: 18/daily- reports has not drink in 2 1/2years  . Drug use: Yes    Frequency: 3.0 times per week    Types: Marijuana     Allergies   Benadryl [diphenhydramine hcl]; Benadryl [diphenhydramine]; Sulfa antibiotics; Tegretol [carbamazepine]; Librium [chlordiazepoxide hcl]; Librium [chlordiazepoxide]; and Sulfa antibiotics   Review of Systems Review of Systems  Constitutional: Negative for appetite change, chills and  fever.  Respiratory: Negative for shortness of breath.   Cardiovascular: Negative for chest pain.  Gastrointestinal: Positive for abdominal distention, abdominal pain, constipation, nausea and vomiting. Negative for diarrhea.  Genitourinary: Negative for difficulty urinating.  Musculoskeletal: Negative for arthralgias.  Skin: Negative for color change.     Physical Exam Updated Vital Signs BP (!) 152/75 (BP Location: Right Arm)   Pulse 100   Temp 98.3 F (36.8 C) (Oral)   Resp 16   Ht 6' (1.829 m)   Wt 81.6 kg   SpO2 100%   BMI 24.41 kg/m   Physical Exam Constitutional:      Appearance: He is ill-appearing (chronically). He is not toxic-appearing.  HENT:     Head: Normocephalic.  Eyes:     General: Scleral icterus (mild) present.  Cardiovascular:     Rate and Rhythm: Normal  rate and regular rhythm.  Pulmonary:     Effort: Pulmonary effort is normal.     Breath sounds: Normal breath sounds.  Abdominal:     General: Abdomen is protuberant. There is distension (mild, soft).     Palpations: Abdomen is soft. There is hepatomegaly.     Tenderness: There is abdominal tenderness in the right upper quadrant.  Skin:    General: Skin is warm.  Neurological:     Mental Status: He is alert and oriented to person, place, and time.      ED Treatments / Results  Labs (all labs ordered are listed, but only abnormal results are displayed) Labs Reviewed  CBC - Abnormal; Notable for the following components:      Result Value   WBC 13.6 (*)    RBC 2.75 (*)    Hemoglobin 8.6 (*)    HCT 26.4 (*)    All other components within normal limits  LIPASE, BLOOD  COMPREHENSIVE METABOLIC PANEL  URINALYSIS, ROUTINE W REFLEX MICROSCOPIC    EKG None  Radiology No results found.  Procedures Procedures (including critical care time)  Medications Ordered in ED Medications  sodium chloride flush (NS) 0.9 % injection 3 mL (has no administration in time range)     Initial  Impression / Assessment and Plan / ED Course  I have reviewed the triage vital signs and the nursing notes.  Pertinent labs & imaging results that were available during my care of the patient were reviewed by me and considered in my medical decision making (see chart for details).        Noted slight drop in H&H and sodium;  Will proceed with CT abd/pelv, order hemoccult;    Hemoccult negative.  Labs/imaging pending and will sign out to oncoming PA.   Possible that pt may have acute pathology and require admission.  Final Clinical Impressions(s) / ED Diagnoses   Final diagnoses:  None    ED Discharge Orders    None       Noa Constante, Imagene Riches, PA-C 08/04/18 2300    Lacretia Leigh, MD 08/04/18 2328

## 2018-08-04 NOTE — Discharge Instructions (Signed)
Take the prescribed medication as directed.  Try to limit your water intake to help your sodium go back up. Follow-up with your primary care doctor. Return to the ED for new or worsening symptoms.

## 2018-08-04 NOTE — ED Triage Notes (Signed)
Patient c/o abdominal pain, emesis, abdominal distention and constipation. Patient states he has cirrhosis of the liver and had abdomen drained a month ago. Patient states he feels like his ammonia level is elevated.

## 2018-08-04 NOTE — ED Notes (Signed)
Urine culture sent to the lab.

## 2018-08-04 NOTE — ED Notes (Signed)
Pt resting in bed. Awaiting provider for details and plan of care. N oacute distress. VS rechecked.

## 2018-08-04 NOTE — ED Provider Notes (Signed)
Assumed care from prior team at shift change.  See notes for full H&P.  Briefly, 63 y.o. M here with abdominal pain, nausea, and vomiting.  Has history of cirrhosis of the liver from EtOH as well as hep C and new diagnosis of liver cancer in Jan 2020.  Mild drop in hemoglobin-- hemoccult negative here  Plan:  CT pending.  Results for orders placed or performed during the hospital encounter of 08/04/18  Lipase, blood  Result Value Ref Range   Lipase 25 11 - 51 U/L  Comprehensive metabolic panel  Result Value Ref Range   Sodium 124 (L) 135 - 145 mmol/L   Potassium 4.2 3.5 - 5.1 mmol/L   Chloride 87 (L) 98 - 111 mmol/L   CO2 28 22 - 32 mmol/L   Glucose, Bld 201 (H) 70 - 99 mg/dL   BUN 16 8 - 23 mg/dL   Creatinine, Ser 0.81 0.61 - 1.24 mg/dL   Calcium 7.9 (L) 8.9 - 10.3 mg/dL   Total Protein 6.7 6.5 - 8.1 g/dL   Albumin 1.7 (L) 3.5 - 5.0 g/dL   AST 36 15 - 41 U/L   ALT 19 0 - 44 U/L   Alkaline Phosphatase 167 (H) 38 - 126 U/L   Total Bilirubin 0.4 0.3 - 1.2 mg/dL   GFR calc non Af Amer >60 >60 mL/min   GFR calc Af Amer >60 >60 mL/min   Anion gap 9 5 - 15  CBC  Result Value Ref Range   WBC 13.6 (H) 4.0 - 10.5 K/uL   RBC 2.75 (L) 4.22 - 5.81 MIL/uL   Hemoglobin 8.6 (L) 13.0 - 17.0 g/dL   HCT 26.4 (L) 39.0 - 52.0 %   MCV 96.0 80.0 - 100.0 fL   MCH 31.3 26.0 - 34.0 pg   MCHC 32.6 30.0 - 36.0 g/dL   RDW 14.4 11.5 - 15.5 %   Platelets 394 150 - 400 K/uL   nRBC 0.0 0.0 - 0.2 %  Urinalysis, Routine w reflex microscopic  Result Value Ref Range   Color, Urine YELLOW YELLOW   APPearance CLEAR CLEAR   Specific Gravity, Urine 1.018 1.005 - 1.030   pH 5.0 5.0 - 8.0   Glucose, UA 50 (A) NEGATIVE mg/dL   Hgb urine dipstick MODERATE (A) NEGATIVE   Bilirubin Urine NEGATIVE NEGATIVE   Ketones, ur NEGATIVE NEGATIVE mg/dL   Protein, ur 100 (A) NEGATIVE mg/dL   Nitrite NEGATIVE NEGATIVE   Leukocytes,Ua NEGATIVE NEGATIVE   RBC / HPF 21-50 0 - 5 RBC/hpf   WBC, UA 6-10 0 - 5 WBC/hpf    Bacteria, UA RARE (A) NONE SEEN   Squamous Epithelial / LPF 0-5 0 - 5   Mucus PRESENT    Hyaline Casts, UA PRESENT   Ethanol  Result Value Ref Range   Alcohol, Ethyl (B) <10 <10 mg/dL   Ct Abdomen Pelvis W Contrast  Result Date: 08/04/2018 CLINICAL DATA:  Abdominal pain and distension. Ascites suspected. Patient with history of cirrhosis. EXAM: CT ABDOMEN AND PELVIS WITH CONTRAST TECHNIQUE: Multidetector CT imaging of the abdomen and pelvis was performed using the standard protocol following bolus administration of intravenous contrast. CONTRAST:  127m OMNIPAQUE IOHEXOL 300 MG/ML  SOLN COMPARISON:  CT and abdominal MRI 05/31/2018 FINDINGS: Lower chest: Right lower lobe nodule on prior exam not included in the field of view. No pleural fluid. Mitral annulus calcifications. Hepatobiliary: Hepatic cirrhosis with nodular contours. The known hepatic lesions on prior exam are not as  well-defined currently given phase of contrast. Calcified granuloma superiorly. Portal vein is patent. Mild gallbladder distention without calcified gallstone or biliary dilatation. Pancreas: No ductal dilatation or inflammation. Spleen: Normal in size without focal abnormality. Adrenals/Urinary Tract: No adrenal nodule. No hydronephrosis or perinephric edema. Homogeneous renal enhancement with symmetric excretion on delayed phase imaging. Urinary bladder is physiologically distended without wall thickening. Stomach/Bowel: Paraesophageal varices. Stomach is nondistended. Bowel evaluation limited in the absence of enteric contrast. No evidence of bowel obstruction or inflammation. Fluid-filled pelvic bowel loops are nondilated. Moderate stool burden in the colon. No colonic wall thickening or inflammation. Appendix not well-defined on the current exam. Vascular/Lymphatic: Upper abdominal adenopathy with progression from prior exam. For example portacaval node measures 15 mm, image 23 series 2, previously 12 mm. Left para celiac node  measures 19 mm, image 24 series 2, previously 12 mm. Posterior pancreatic node is unchanged measuring 12 mm, image 23 series 2, previously 12 mm. An adjacent peripancreatic node is increased currently 13 mm, previously 9 mm. Portal and splenic veins are patent. Portosystemic collaterals in the left abdomen. Abdominal aorta is normal in caliber with atherosclerosis. Reproductive: Prostatic calcifications Other: Small volume of abdominopelvic ascites, decreased in degree from prior CT. Residual fluid in the right upper quadrant and pelvis. No free intra-abdominal air. Mild body wall edema as before. Musculoskeletal: There are no acute or suspicious osseous abnormalities. IMPRESSION: 1. 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 Small volume of abdominopelvic ascites has decreased from prior CT. 2. Patient's known enhancing liver lesions are not as well visualized on the current exam given phase of contrast. Upper abdominal adenopathy has mildly progressed from prior exam. 3.  Aortic Atherosclerosis (ICD10-I70.0). Electronically Signed   By: Keith Rake M.D.   On: 08/04/2018 23:03    11:22 PM On reassessment, patient feeling better.  CT overall unchanged, ascites has decreased from prior.  He does have noted low sodium here at 124.  He reports he has been drinking up to 2 gallons of water a day.  He will need to limit free water intake, zofran and phenergan for nausea.  Can follow-up as an OP.  He will return here for any new/acute changes.  Patient seen and evaluated with attending physician, Dr. Zenia Resides, who agrees with plan of care.   Larene Pickett, PA-C 08/05/18 0045    Lacretia Leigh, MD 08/05/18 [phone]

## 2018-08-04 NOTE — ED Notes (Signed)
ED Provider at bedside.

## 2018-08-05 NOTE — ED Notes (Signed)
Pt given and verbalized understanding of d/c instructions and need for follow up with pcp. Told to return if s/s worsen. Pt given crackers and soda upon d/c and ambulated out. No further distress or questions at this time

## 2018-08-16 ENCOUNTER — Other Ambulatory Visit (HOSPITAL_COMMUNITY): Payer: Self-pay

## 2018-08-16 ENCOUNTER — Ambulatory Visit (HOSPITAL_COMMUNITY): Admit: 2018-08-16 | Payer: Self-pay | Admitting: Interventional Radiology

## 2018-08-16 ENCOUNTER — Encounter (HOSPITAL_COMMUNITY): Payer: Self-pay

## 2018-08-16 SURGERY — CT WITH ANESTHESIA
Anesthesia: General

## 2018-08-29 ENCOUNTER — Other Ambulatory Visit: Payer: Self-pay | Admitting: Radiology

## 2018-08-29 NOTE — Progress Notes (Signed)
06-01-18 ( [facility]) EKG  05-31-18 (E[facility]) CT Chest

## 2018-08-29 NOTE — Patient Instructions (Addendum)
[name]  08/29/2018   Your procedure is scheduled on: 09-06-18    Report to [facility] Main  Entrance    Report to Admitting at 6:30 AM    Call this number if you have problems the morning of surgery [phone]    Remember: Do not eat food or drink liquids :After Midnight.    BRUSH YOUR TEETH MORNING OF SURGERY AND RINSE YOUR MOUTH OUT, NO CHEWING GUM CANDY OR MINTS.     Take these medicines the morning of surgery with A SIP OF WATER: Gabapentin (Neurontin)  DO NOT TAKE ANY DIABETIC MEDICATIONS DAY OF YOUR SURGERY                               You may not have any metal on your body including hair pins and              piercings  Do not wear jewelry, cologne, lotions, powders or deodorant             Men may shave face and neck.   Do not bring valuables to the hospital. [place].  Contacts, dentures or bridgework may not be worn into surgery.  Leave suitcase in the car. After surgery it may be brought to your room.     Patients discharged the day of surgery will not be allowed to drive home. IF YOU ARE HAVING SURGERY AND GOING HOME THE SAME DAY, YOU MUST HAVE AN ADULT TO DRIVE YOU HOME AND BE WITH YOU FOR 24 HOURS. YOU MAY GO HOME BY TAXI OR UBER OR ORTHERWISE, BUT AN ADULT MUST ACCOMPANY YOU HOME AND STAY WITH YOU FOR 24 HOURS. How to Manage Your Diabetes Before and After Surgery  Special Instructions: N/A              Please read over the following fact sheets you were given: __________________________________________________________   Why is it important to control my blood sugar before and after surgery? . Improving blood sugar levels before and after surgery helps healing and can limit problems. . A way of improving blood sugar control is eating a healthy diet by: o  Eating less sugar and carbohydrates o  Increasing activity/exercise o  Talking with your doctor about reaching your  blood sugar goals . High blood sugars (greater than 180 mg/dL) can raise your risk of infections and slow your recovery, so you will need to focus on controlling your diabetes during the weeks before surgery. . Make sure that the doctor who takes care of your diabetes knows about your planned surgery including the date and location.  How do I manage my blood sugar before surgery? . Check your blood sugar at least 4 times a day, starting 2 days before surgery, to make sure that the level is not too high or low. o Check your blood sugar the morning of your surgery when you wake up and every 2 hours until you get to the Short Stay unit. . If your blood sugar is less than 70 mg/dL, you will need to treat for low blood sugar: o Do not take insulin. o Treat a low blood sugar (less than 70 mg/dL) with  cup of clear juice (cranberry or apple),  4 glucose tablets, OR glucose gel. o Recheck blood sugar in 15 minutes after treatment (to make sure it is greater than 70 mg/dL). If your blood sugar is not greater than 70 mg/dL on recheck, call [phone] for further instructions. . Report your blood sugar to the short stay nurse when you get to Short Stay.  . If you are admitted to the hospital after surgery: o Your blood sugar will be checked by the staff and you will probably be given insulin after surgery (instead of oral diabetes medicines) to make sure you have good blood sugar levels. o The goal for blood sugar control after surgery is 80-180 mg/dL.   WHAT DO I DO ABOUT MY DIABETES MEDICATION?  Marland Kitchen Do not take oral diabetes medicines (pills) the morning of surgery.  . THE DAY BEFORE SURGERY, take your usual 30 units of Lantus insulin, and your Metformin.        Reviewed and Endorsed by [facility] Patient Education Committee, August 2015   _____________________________________________________________________ [facility] - Preparing for Surgery Before surgery, you can play an important role.   Because skin is not sterile, your skin needs to be as free of germs as possible.  You can reduce the number of germs on your skin by washing with CHG (chlorahexidine gluconate) soap before surgery.  CHG is an antiseptic cleaner which kills germs and bonds with the skin to continue killing germs even after washing. Please DO NOT use if you have an allergy to CHG or antibacterial soaps.  If your skin becomes reddened/irritated stop using the CHG and inform your nurse when you arrive at Short Stay. Do not shave (including legs and underarms) for at least 48 hours prior to the first CHG shower.  You may shave your face/neck. Please follow these instructions carefully:  1.  Shower with CHG Soap the night before surgery and the  morning of Surgery.  2.  If you choose to wash your hair, wash your hair first as usual with your  normal  shampoo.  3.  After you shampoo, rinse your hair and body thoroughly to remove the  shampoo.                           4.  Use CHG as you would any other liquid soap.  You can apply chg directly  to the skin and wash                       Gently with a scrungie or clean washcloth.  5.  Apply the CHG Soap to your body ONLY FROM THE NECK DOWN.   Do not use on face/ open                           Wound or open sores. Avoid contact with eyes, ears mouth and genitals (private parts).                       Wash face,  Genitals (private parts) with your normal soap.             6.  Wash thoroughly, paying special attention to the area where your surgery  will be performed.  7.  Thoroughly rinse your body with warm water from the neck down.  8.  DO NOT shower/wash with your normal soap after using and rinsing off  the CHG  Soap.                9.  Pat yourself dry with a clean towel.            10.  Wear clean pajamas.            11.  Place clean sheets on your bed the night of your first shower and do not  sleep with pets. Day of Surgery : Do not apply any lotions/deodorants the  morning of surgery.  Please wear clean clothes to the hospital/surgery center.  FAILURE TO FOLLOW THESE INSTRUCTIONS MAY RESULT IN THE CANCELLATION OF YOUR SURGERY PATIENT SIGNATURE_________________________________  NURSE SIGNATURE__________________________________  ________________________________________________________________________  WHAT IS A BLOOD TRANSFUSION? Blood Transfusion Information  A transfusion is the replacement of blood or some of its parts. Blood is made up of multiple cells which provide different functions.  Red blood cells carry oxygen and are used for blood loss replacement.  White blood cells fight against infection.  Platelets control bleeding.  Plasma helps clot blood.  Other blood products are available for specialized needs, such as hemophilia or other clotting disorders. BEFORE THE TRANSFUSION  Who gives blood for transfusions?   Healthy volunteers who are fully evaluated to make sure their blood is safe. This is blood bank blood. Transfusion therapy is the safest it has ever been in the practice of medicine. Before blood is taken from a donor, a complete history is taken to make sure that person has no history of diseases nor engages in risky social behavior (examples are intravenous drug use or sexual activity with multiple partners). The donor's travel history is screened to minimize risk of transmitting infections, such as malaria. The donated blood is tested for signs of infectious diseases, such as HIV and hepatitis. The blood is then tested to be sure it is compatible with you in order to minimize the chance of a transfusion reaction. If you or a relative donates blood, this is often done in anticipation of surgery and is not appropriate for emergency situations. It takes many days to process the donated blood. RISKS AND COMPLICATIONS Although transfusion therapy is very safe and saves many lives, the main dangers of transfusion include:   Getting an  infectious disease.  Developing a transfusion reaction. This is an allergic reaction to something in the blood you were given. Every precaution is taken to prevent this. The decision to have a blood transfusion has been considered carefully by your caregiver before blood is given. Blood is not given unless the benefits outweigh the risks. AFTER THE TRANSFUSION  Right after receiving a blood transfusion, you will usually feel much better and more energetic. This is especially true if your red blood cells have gotten low (anemic). The transfusion raises the level of the red blood cells which carry oxygen, and this usually causes an energy increase.  The nurse administering the transfusion will monitor you carefully for complications. HOME CARE INSTRUCTIONS  No special instructions are needed after a transfusion. You may find your energy is better. Speak with your caregiver about any limitations on activity for underlying diseases you may have. SEEK MEDICAL CARE IF:   Your condition is not improving after your transfusion.  You develop redness or irritation at the intravenous (IV) site. SEEK IMMEDIATE MEDICAL CARE IF:  Any of the following symptoms occur over the next 12 hours:  Shaking chills.  You have a temperature by mouth above 102 F (38.9 C), not controlled by medicine.  Chest, back, or muscle pain.  People around you feel you are not acting correctly or are confused.  Shortness of breath or difficulty breathing.  Dizziness and fainting.  You get a rash or develop hives.  You have a decrease in urine output.  Your urine turns a dark color or changes to pink, red, or brown. Any of the following symptoms occur over the next 10 days:  You have a temperature by mouth above 102 F (38.9 C), not controlled by medicine.  Shortness of breath.  Weakness after normal activity.  The white part of the eye turns yellow (jaundice).  You have a decrease in the amount of urine or  are urinating less often.  Your urine turns a dark color or changes to pink, red, or brown. Document Released: 04/23/2000 Document Revised: 07/19/2011 Document Reviewed: 12/11/2007 [facility] Patient Information 2014 [place], [place].  _______________________________________________________________________

## 2018-08-30 NOTE — Progress Notes (Signed)
SPOKE W/  [place]     SCREENING SYMPTOMS OF COVID 19:   COUGH--YES (SMOKER)  RUNNY NOSE--- NO  SORE THROAT---NO  NASAL CONGESTION----NO  SNEEZING----NO  SHORTNESS OF BREATH---NO  DIFFICULTY BREATHING---NO  TEMP >100.4-----NO  UNEXPLAINED BODY ACHES------NO   HAVE YOU OR ANY FAMILY MEMBER TRAVELLED PAST 14 DAYS OUT OF THE   COUNTY---NO STATE----NO COUNTRY----NO  HAVE YOU OR ANY FAMILY MEMBER BEEN EXPOSED TO ANYONE WITH COVID 19? NO

## 2018-08-31 ENCOUNTER — Encounter (HOSPITAL_COMMUNITY): Payer: Self-pay

## 2018-08-31 ENCOUNTER — Other Ambulatory Visit: Payer: Self-pay

## 2018-08-31 ENCOUNTER — Encounter (HOSPITAL_COMMUNITY)
Admission: RE | Admit: 2018-08-31 | Discharge: 2018-08-31 | Disposition: A | Discharge status: Home / Self Care | Payer: Medicaid Other | Source: Ambulatory Visit | Attending: Interventional Radiology | Admitting: Interventional Radiology

## 2018-08-31 ENCOUNTER — Other Ambulatory Visit: Payer: Self-pay | Admitting: Nurse Practitioner

## 2018-08-31 DIAGNOSIS — Z01812 Encounter for preprocedural laboratory examination: Secondary | ICD-10-CM | POA: Insufficient documentation

## 2018-08-31 DIAGNOSIS — C220 Liver cell carcinoma: Secondary | ICD-10-CM

## 2018-08-31 LAB — COMPREHENSIVE METABOLIC PANEL
ALT: 17 U/L (ref 0–44)
AST: 33 U/L (ref 15–41)
Albumin: 1.6 g/dL — ABNORMAL LOW (ref 3.5–5.0)
Alkaline Phosphatase: 163 U/L — ABNORMAL HIGH (ref 38–126)
Anion gap: 6 (ref 5–15)
BUN: 12 mg/dL (ref 8–23)
CO2: 28 mmol/L (ref 22–32)
Calcium: 7.5 mg/dL — ABNORMAL LOW (ref 8.9–10.3)
Chloride: 95 mmol/L — ABNORMAL LOW (ref 98–111)
Creatinine, Ser: 0.72 mg/dL (ref 0.61–1.24)
GFR calc Af Amer: >60 mL/min (ref >60)
GFR calc non Af Amer: >60 mL/min (ref >60)
Glucose, Bld: 332 mg/dL — ABNORMAL HIGH (ref 70–99)
Potassium: 4.3 mmol/L (ref 3.5–5.1)
Sodium: 129 mmol/L — ABNORMAL LOW (ref 135–145)
Total Bilirubin: 0.4 mg/dL (ref 0.3–1.2)
Total Protein: 6.7 g/dL (ref 6.5–8.1)

## 2018-08-31 LAB — CBC WITH DIFFERENTIAL/PLATELET
Abs Immature Granulocytes: 0.1 10*3/uL — ABNORMAL HIGH (ref 0.00–0.07)
Basophils Absolute: 0.1 10*3/uL (ref 0.0–0.1)
Basophils Relative: 1 %
Eosinophils Absolute: 0.4 10*3/uL (ref 0.0–0.5)
Eosinophils Relative: 3 %
HCT: 22.2 % — ABNORMAL LOW (ref 39.0–52.0)
Hemoglobin: 7.3 g/dL — ABNORMAL LOW (ref 13.0–17.0)
Immature Granulocytes: 1 %
Lymphocytes Relative: 13 %
Lymphs Abs: 1.7 10*3/uL (ref 0.7–4.0)
MCH: 32 pg (ref 26.0–34.0)
MCHC: 32.9 g/dL (ref 30.0–36.0)
MCV: 97.4 fL (ref 80.0–100.0)
Monocytes Absolute: 1.3 10*3/uL — ABNORMAL HIGH (ref 0.1–1.0)
Monocytes Relative: 10 %
Neutro Abs: 9.1 10*3/uL — ABNORMAL HIGH (ref 1.7–7.7)
Neutrophils Relative %: 72 %
Platelets: 333 10*3/uL (ref 150–400)
RBC: 2.28 MIL/uL — ABNORMAL LOW (ref 4.22–5.81)
RDW: 14.2 % (ref 11.5–15.5)
WBC: 12.6 10*3/uL — ABNORMAL HIGH (ref 4.0–10.5)
nRBC: 0 % (ref 0.0–0.2)

## 2018-08-31 LAB — PROTIME-INR
INR: 1.2 (ref 0.8–1.2)
Prothrombin Time: 14.6 seconds (ref 11.4–15.2)

## 2018-08-31 LAB — GLUCOSE, CAPILLARY: Glucose-Capillary: 321 mg/dL — ABNORMAL HIGH (ref 70–99)

## 2018-08-31 LAB — HEMOGLOBIN A1C
Hgb A1c MFr Bld: 8 % — ABNORMAL HIGH (ref 4.8–5.6)
Mean Plasma Glucose: 182.9 mg/dL

## 2018-08-31 NOTE — Progress Notes (Signed)
Anesthesia Chart Review   Case:  [id number] Date/Time:  09/06/18 0815   Procedure:  MICROWAVE THERMAL ABLATION (N/A )   Anesthesia type:  General   Pre-op diagnosis:  HEPATOCELLULAR CARCINOMA, LIVER   [facility]:  [facility] / [facility]   Surgeon:  Aletta Edouard, MD      DISCUSSION: 63 yo current every day smoker (20 pack years) with h/o anxiety, depression, DM II, h/o alcohol abuse and substance abuse, hepatocellular carcinoma scheduled for above procedure 09/06/18 with Dr. Aletta Edouard.    Hemoglobin 7.4 at PAT visit 08/31/18.  Message left with triage nurse for Dr. [name].  Per Ned Card, NP pt is to be seen 09/04/18, CBC will be repeated at this visit.     Glucose 332 at PAT visit.  Pt reports he has not taken metformin or insulin today.  He also reports he does not regularly check his blood glucose at home, but does report recently getting a new glucometer.  Discussed the importance of improved blood glucose control prior to upcoming surgery.  Discussed medications, he will contact PCP with further questions and if blood sugars become higher or do not improve after he resumes medications and using glucometer.  Discussed with Dr. Daiva Huge.  Ok to proceed barring acute status change and pending CBG DOS and repeat CBC scheduled 09/04/18.  VS: BP (!) 155/77 (BP Location: Left Arm)   Pulse 99   Temp 36.9 C (Oral)   Resp 18   Ht 6' (1.829 m)   Wt 85.1 kg   SpO2 100%   BMI 25.44 kg/m   PROVIDERS: Placey, Audrea Muscat, NP is PCP   Leodis Rains, MD is Oncologist  LABS: Hemoglobin called into Oncologist (all labs ordered are listed, but only abnormal results are displayed)  Labs Reviewed  GLUCOSE, CAPILLARY - Abnormal; Notable for the following components:      Result Value   Glucose-Capillary 321 (*)    All other components within normal limits  CBC WITH DIFFERENTIAL/PLATELET - Abnormal; Notable for the following components:   WBC 12.6 (*)    RBC 2.28 (*)    Hemoglobin 7.3 (*)    HCT 22.2  (*)    Neutro Abs 9.1 (*)    Monocytes Absolute 1.3 (*)    Abs Immature Granulocytes 0.10 (*)    All other components within normal limits  COMPREHENSIVE METABOLIC PANEL - Abnormal; Notable for the following components:   Sodium 129 (*)    Chloride 95 (*)    Glucose, Bld 332 (*)    Calcium 7.5 (*)    Albumin 1.6 (*)    Alkaline Phosphatase 163 (*)    All other components within normal limits  PROTIME-INR  HEMOGLOBIN A1C  TYPE AND SCREEN     IMAGES: 08/04/18 CT Abdomen Pelvis  IMPRESSION: 1. 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 Small volume of abdominopelvic ascites has decreased from prior CT. 2. Patient's known enhancing liver lesions are not as well visualized on the current exam given phase of contrast. Upper abdominal adenopathy has mildly progressed from prior exam. 3.  Aortic Atherosclerosis (ICD10-I70.0).  EKG: 05/31/18 Rate 101 Sinus tachycardia Borderline T wave abnormalities  CV:  Past Medical History:  Diagnosis Date  . Alcoholic ([place])   . Alcoholic cirrhosis of liver ([place])    Pt calls this a false diagnosis. Pt denies  . Alcoholism /alcohol abuse ([place])   . Anxiety   . Cancer ([place])   . Chronic bronchitis ([place])   .  Chronic hyponatremia    pt denies having   . Depression   . Diabetes mellitus   . Diabetes mellitus without complication ([place])   . Hepatitis C   . Hyperglycemia   . Neuropathy   . Substance abuse ([place])   . Thrombocytopenia ([place])   . Tobacco use 05/31/2018    Past Surgical History:  Procedure Laterality Date  . BIOPSY  06/12/2018   Procedure: BIOPSY;  Surgeon: Mauri Pole, MD;  [facility]: [facility];  Service: Endoscopy;;  . COLONOSCOPY WITH PROPOFOL N/A 06/12/2018   Procedure: COLONOSCOPY WITH PROPOFOL;  Surgeon: Mauri Pole, MD;  [facility];  Service: Endoscopy;  Laterality: N/A;  . ESOPHAGOGASTRODUODENOSCOPY (EGD) WITH PROPOFOL N/A 06/12/2018   Procedure:  ESOPHAGOGASTRODUODENOSCOPY (EGD) WITH PROPOFOL;  Surgeon: Mauri Pole, MD;  [facility];  Service: Endoscopy;  Laterality: N/A;  . FRACTURE SURGERY  right lower leg plate placed years ago  . IR RADIOLOGIST EVAL & MGMT  06/29/2018  . Plate in lower right leg  2008    MEDICATIONS: . albuterol (PROVENTIL HFA;VENTOLIN HFA) 108 (90 Base) MCG/ACT inhaler  . furosemide (LASIX) 40 MG tablet  . gabapentin (NEURONTIN) 400 MG capsule  . insulin glargine (LANTUS) 100 UNIT/ML injection  . metFORMIN (GLUCOPHAGE) 1000 MG tablet  . nadolol (CORGARD) 20 MG tablet  . ondansetron (ZOFRAN ODT) 4 MG disintegrating tablet  . pantoprazole (PROTONIX) 40 MG tablet  . promethazine (PHENERGAN) 25 MG suppository  . spironolactone (ALDACTONE) 50 MG tablet   No current facility-administered medications for this encounter.      Maia Plan [facility] Pre-Surgical Testing [phone] 09/01/18 3:39 PM
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
## 2018-09-01 ENCOUNTER — Telehealth: Payer: Self-pay | Admitting: *Deleted

## 2018-09-01 LAB — ABO/RH: ABO/RH(D): O POS

## 2018-09-01 NOTE — Telephone Encounter (Signed)
"  HGB = 7.2 with pre-op testing"

## 2018-09-01 NOTE — Progress Notes (Signed)
08-31-18 CBC w/Diff and CMP routed to Dr. Kathlene Cote for review

## 2018-09-01 NOTE — Anesthesia Preprocedure Evaluation (Deleted)
Anesthesia Evaluation    Airway        Dental   Pulmonary Current Smoker,           Cardiovascular      Neuro/Psych    GI/Hepatic   Endo/Other  diabetes  Renal/GU      Musculoskeletal   Abdominal   Peds  Hematology   Anesthesia Other Findings   Reproductive/Obstetrics                             Anesthesia Physical Anesthesia Plan  ASA:   Anesthesia Plan:    Post-op Pain Management:    Induction:   PONV Risk Score and Plan:   Airway Management Planned:   Additional Equipment:   Intra-op Plan:   Post-operative Plan:   Informed Consent:   Plan Discussed with:   Anesthesia Plan Comments: (See PAT note 08/31/2018, Konrad Felix, PA-C)        Anesthesia Quick Evaluation

## 2018-09-04 ENCOUNTER — Other Ambulatory Visit: Payer: Self-pay | Admitting: Radiology

## 2018-09-04 ENCOUNTER — Inpatient Hospital Stay: Payer: Medicaid Other | Attending: Oncology | Admitting: Oncology

## 2018-09-04 ENCOUNTER — Inpatient Hospital Stay: Payer: Medicaid Other

## 2018-09-05 ENCOUNTER — Other Ambulatory Visit: Payer: Self-pay | Admitting: Radiology

## 2018-09-05 ENCOUNTER — Telehealth: Payer: Self-pay | Admitting: Oncology

## 2018-09-05 NOTE — Telephone Encounter (Signed)
Scheduled appt per sch msg. Mailed printout

## 2018-09-05 NOTE — Anesthesia Preprocedure Evaluation (Addendum)
Anesthesia Evaluation  Patient identified by MRN, date of birth, ID band Patient awake    Reviewed: Allergy & Precautions, NPO status , Patient's Chart, lab work & pertinent test results  History of Anesthesia Complications Negative for: history of anesthetic complications  Airway Mallampati: I  TM Distance: >3 FB Neck ROM: Full    Dental  (+) Edentulous Upper, Edentulous Lower, Dental Advisory Given   Pulmonary Current Smoker,    Pulmonary exam normal  (-) decreased breath sounds      Cardiovascular negative cardio ROS Normal cardiovascular exam     Neuro/Psych PSYCHIATRIC DISORDERS Anxiety Depression negative neurological ROS     GI/Hepatic negative GI ROS, (+)     substance abuse  alcohol use, Hepatitis -, C  Endo/Other  diabetes, Insulin Dependent  Renal/GU negative Renal ROS  negative genitourinary   Musculoskeletal negative musculoskeletal ROS (+)   Abdominal   Peds negative pediatric ROS (+)  Hematology  (+) anemia ,   Anesthesia Other Findings   Reproductive/Obstetrics negative OB ROS                           Anesthesia Physical  Anesthesia Plan  ASA: III  Anesthesia Plan: General   Post-op Pain Management:    Induction: Intravenous  PONV Risk Score and Plan: 2 and Ondansetron and Dexamethasone  Airway Management Planned: Oral ETT  Additional Equipment:   Intra-op Plan:   Post-operative Plan: Extubation in OR  Informed Consent: I have reviewed the patients History and Physical, chart, labs and discussed the procedure including the risks, benefits and alternatives for the proposed anesthesia with the patient or authorized representative who has indicated his/her understanding and acceptance.     Dental advisory given  Plan Discussed with: Anesthesiologist and CRNA  Anesthesia Plan Comments:        Anesthesia Quick Evaluation

## 2018-09-06 ENCOUNTER — Ambulatory Visit (HOSPITAL_COMMUNITY)
Admission: RE | Admit: 2018-09-06 | Discharge: 2018-09-06 | Disposition: A | Discharge status: Home / Self Care | Payer: Medicaid Other | Source: Ambulatory Visit | Attending: Interventional Radiology | Admitting: Interventional Radiology

## 2018-09-06 ENCOUNTER — Ambulatory Visit (HOSPITAL_COMMUNITY): Payer: Medicaid Other | Admitting: Physician Assistant

## 2018-09-06 ENCOUNTER — Encounter (HOSPITAL_COMMUNITY)
Admission: RE | Disposition: A | Discharge status: Home / Self Care | Payer: Self-pay | Source: Home / Self Care | Attending: Interventional Radiology

## 2018-09-06 ENCOUNTER — Encounter (HOSPITAL_COMMUNITY): Payer: Self-pay

## 2018-09-06 ENCOUNTER — Ambulatory Visit (HOSPITAL_COMMUNITY): Payer: Medicaid Other | Admitting: Certified Registered Nurse Anesthetist

## 2018-09-06 ENCOUNTER — Other Ambulatory Visit: Payer: Self-pay

## 2018-09-06 ENCOUNTER — Inpatient Hospital Stay (HOSPITAL_COMMUNITY)
Admission: RE | Admit: 2018-09-06 | Discharge: 2018-09-08 | DRG: 405 | Disposition: A | Discharge status: Home / Self Care | Payer: Medicaid Other | Attending: Interventional Radiology | Admitting: Interventional Radiology

## 2018-09-06 DIAGNOSIS — Z79899 Other long term (current) drug therapy: Secondary | ICD-10-CM

## 2018-09-06 DIAGNOSIS — Z794 Long term (current) use of insulin: Secondary | ICD-10-CM

## 2018-09-06 DIAGNOSIS — C220 Liver cell carcinoma: Secondary | ICD-10-CM

## 2018-09-06 DIAGNOSIS — G92 Toxic encephalopathy: Secondary | ICD-10-CM | POA: Diagnosis not present

## 2018-09-06 DIAGNOSIS — I8510 Secondary esophageal varices without bleeding: Secondary | ICD-10-CM | POA: Diagnosis present

## 2018-09-06 DIAGNOSIS — D638 Anemia in other chronic diseases classified elsewhere: Secondary | ICD-10-CM | POA: Diagnosis present

## 2018-09-06 DIAGNOSIS — E871 Hypo-osmolality and hyponatremia: Secondary | ICD-10-CM | POA: Diagnosis present

## 2018-09-06 DIAGNOSIS — T40605A Adverse effect of unspecified narcotics, initial encounter: Secondary | ICD-10-CM | POA: Diagnosis not present

## 2018-09-06 DIAGNOSIS — E119 Type 2 diabetes mellitus without complications: Secondary | ICD-10-CM

## 2018-09-06 DIAGNOSIS — T424X5A Adverse effect of benzodiazepines, initial encounter: Secondary | ICD-10-CM | POA: Diagnosis not present

## 2018-09-06 DIAGNOSIS — Z888 Allergy status to other drugs, medicaments and biological substances status: Secondary | ICD-10-CM

## 2018-09-06 DIAGNOSIS — Y92230 Patient room in hospital as the place of occurrence of the external cause: Secondary | ICD-10-CM | POA: Diagnosis not present

## 2018-09-06 DIAGNOSIS — F321 Major depressive disorder, single episode, moderate: Secondary | ICD-10-CM | POA: Diagnosis present

## 2018-09-06 DIAGNOSIS — K7031 Alcoholic cirrhosis of liver with ascites: Secondary | ICD-10-CM | POA: Diagnosis present

## 2018-09-06 DIAGNOSIS — F17210 Nicotine dependence, cigarettes, uncomplicated: Secondary | ICD-10-CM | POA: Diagnosis present

## 2018-09-06 DIAGNOSIS — B182 Chronic viral hepatitis C: Secondary | ICD-10-CM | POA: Diagnosis present

## 2018-09-06 DIAGNOSIS — Z882 Allergy status to sulfonamides status: Secondary | ICD-10-CM

## 2018-09-06 DIAGNOSIS — E1140 Type 2 diabetes mellitus with diabetic neuropathy, unspecified: Secondary | ICD-10-CM | POA: Diagnosis present

## 2018-09-06 DIAGNOSIS — F1020 Alcohol dependence, uncomplicated: Secondary | ICD-10-CM | POA: Diagnosis present

## 2018-09-06 HISTORY — PX: RADIOFREQUENCY ABLATION: SHX2290

## 2018-09-06 LAB — GLUCOSE, CAPILLARY
Glucose-Capillary: 144 mg/dL — ABNORMAL HIGH (ref 70–99)
Glucose-Capillary: 134 mg/dL — ABNORMAL HIGH (ref 70–99)
Glucose-Capillary: 157 mg/dL — ABNORMAL HIGH (ref 70–99)
Glucose-Capillary: 160 mg/dL — ABNORMAL HIGH (ref 70–99)

## 2018-09-06 LAB — COMPREHENSIVE METABOLIC PANEL
ALT: 17 U/L (ref 0–44)
AST: 38 U/L (ref 15–41)
Albumin: 1.8 g/dL — ABNORMAL LOW (ref 3.5–5.0)
Alkaline Phosphatase: 164 U/L — ABNORMAL HIGH (ref 38–126)
Anion gap: 7 (ref 5–15)
BUN: 16 mg/dL (ref 8–23)
CO2: 27 mmol/L (ref 22–32)
Calcium: 8.1 mg/dL — ABNORMAL LOW (ref 8.9–10.3)
Chloride: 93 mmol/L — ABNORMAL LOW (ref 98–111)
Creatinine, Ser: 0.78 mg/dL (ref 0.61–1.24)
GFR calc Af Amer: >60 mL/min (ref >60)
GFR calc non Af Amer: >60 mL/min (ref >60)
Glucose, Bld: 159 mg/dL — ABNORMAL HIGH (ref 70–99)
Potassium: 4.5 mmol/L (ref 3.5–5.1)
Sodium: 127 mmol/L — ABNORMAL LOW (ref 135–145)
Total Bilirubin: 0.4 mg/dL (ref 0.3–1.2)
Total Protein: 6.9 g/dL (ref 6.5–8.1)

## 2018-09-06 LAB — TYPE AND SCREEN
ABO/RH(D): O POS
Antibody Screen: NEGATIVE

## 2018-09-06 LAB — CBC WITH DIFFERENTIAL/PLATELET
Abs Immature Granulocytes: 0.08 10*3/uL — ABNORMAL HIGH (ref 0.00–0.07)
Basophils Absolute: 0.1 10*3/uL (ref 0.0–0.1)
Basophils Relative: 1 %
Eosinophils Absolute: 0.5 10*3/uL (ref 0.0–0.5)
Eosinophils Relative: 3 %
HCT: 24.2 % — ABNORMAL LOW (ref 39.0–52.0)
Hemoglobin: 8 g/dL — ABNORMAL LOW (ref 13.0–17.0)
Immature Granulocytes: 1 %
Lymphocytes Relative: 16 %
Lymphs Abs: 2.5 10*3/uL (ref 0.7–4.0)
MCH: 31.7 pg (ref 26.0–34.0)
MCHC: 33.1 g/dL (ref 30.0–36.0)
MCV: 96 fL (ref 80.0–100.0)
Monocytes Absolute: 1.8 10*3/uL — ABNORMAL HIGH (ref 0.1–1.0)
Monocytes Relative: 12 %
Neutro Abs: 10.4 10*3/uL — ABNORMAL HIGH (ref 1.7–7.7)
Neutrophils Relative %: 67 %
Platelets: 415 10*3/uL — ABNORMAL HIGH (ref 150–400)
RBC: 2.52 MIL/uL — ABNORMAL LOW (ref 4.22–5.81)
RDW: 14.1 % (ref 11.5–15.5)
WBC: 15.3 10*3/uL — ABNORMAL HIGH (ref 4.0–10.5)
nRBC: 0 % (ref 0.0–0.2)

## 2018-09-06 SURGERY — RADIO FREQUENCY ABLATION
Anesthesia: General

## 2018-09-06 MED ORDER — FUROSEMIDE 40 MG PO TABS
40.0000 mg | ORAL_TABLET | ORAL | Status: DC
  Administered 2018-09-08: 40 mg via ORAL
  Filled 2018-09-06 (×2): qty 1

## 2018-09-06 MED ORDER — INSULIN ASPART 100 UNIT/ML ~~LOC~~ SOLN
0.0000 [IU] | Freq: Three times a day (TID) | SUBCUTANEOUS | Status: DC
  Administered 2018-09-06 – 2018-09-07 (×2): 3 [IU] via SUBCUTANEOUS

## 2018-09-06 MED ORDER — SPIRONOLACTONE 25 MG PO TABS
50.0000 mg | ORAL_TABLET | Freq: Two times a day (BID) | ORAL | Status: DC
  Administered 2018-09-06: 50 mg via ORAL
  Filled 2018-09-06 (×2): qty 2

## 2018-09-06 MED ORDER — PIPERACILLIN-TAZOBACTAM 3.375 G IVPB
3.3750 g | Freq: Once | INTRAVENOUS | Status: AC
  Administered 2018-09-06: 3.375 g via INTRAVENOUS
  Filled 2018-09-06: qty 50

## 2018-09-06 MED ORDER — PANTOPRAZOLE SODIUM 40 MG IV SOLR
40.0000 mg | Freq: Two times a day (BID) | INTRAVENOUS | Status: DC
  Administered 2018-09-06 – 2018-09-08 (×4): 40 mg via INTRAVENOUS
  Filled 2018-09-06 (×4): qty 40

## 2018-09-06 MED ORDER — FENTANYL CITRATE (PF) 100 MCG/2ML IJ SOLN
INTRAMUSCULAR | Status: DC | PRN
  Administered 2018-09-06 (×2): 50 ug via INTRAVENOUS
  Administered 2018-09-06: 25 ug via INTRAVENOUS
  Administered 2018-09-06: 50 ug via INTRAVENOUS
  Administered 2018-09-06 (×2): 25 ug via INTRAVENOUS

## 2018-09-06 MED ORDER — DEXMEDETOMIDINE HCL IN NACL 200 MCG/50ML IV SOLN
INTRAVENOUS | Status: DC | PRN
  Administered 2018-09-06 (×4): 4 ug via INTRAVENOUS

## 2018-09-06 MED ORDER — DEXAMETHASONE SODIUM PHOSPHATE 10 MG/ML IJ SOLN
INTRAMUSCULAR | Status: DC | PRN
  Administered 2018-09-06: 4 mg via INTRAVENOUS

## 2018-09-06 MED ORDER — ALBUTEROL SULFATE (2.5 MG/3ML) 0.083% IN NEBU: 2.5000 mg | INHALATION_SOLUTION | Freq: Four times a day (QID) | RESPIRATORY_TRACT | Status: DC | PRN

## 2018-09-06 MED ORDER — PROMETHAZINE HCL 25 MG/ML IJ SOLN
INTRAMUSCULAR | Status: AC
  Administered 2018-09-06: 14:00:00 6.25 mg via INTRAVENOUS
  Filled 2018-09-06: qty 1

## 2018-09-06 MED ORDER — HYDROMORPHONE HCL 1 MG/ML IJ SOLN
INTRAMUSCULAR | Status: AC
  Administered 2018-09-06: 18:00:00 1 mg via INTRAVENOUS
  Filled 2018-09-06: qty 1

## 2018-09-06 MED ORDER — DOCUSATE SODIUM 100 MG PO CAPS
100.0000 mg | ORAL_CAPSULE | Freq: Two times a day (BID) | ORAL | Status: DC
  Administered 2018-09-06 – 2018-09-08 (×3): 100 mg via ORAL
  Filled 2018-09-06 (×4): qty 1

## 2018-09-06 MED ORDER — PROMETHAZINE HCL 25 MG/ML IJ SOLN
6.2500 mg | INTRAMUSCULAR | Status: AC | PRN
  Administered 2018-09-06: 6.25 mg via INTRAVENOUS
  Administered 2018-09-06: 13:00:00 12.5 mg via INTRAVENOUS

## 2018-09-06 MED ORDER — ONDANSETRON HCL 4 MG/2ML IJ SOLN
4.0000 mg | Freq: Four times a day (QID) | INTRAMUSCULAR | Status: DC | PRN
  Filled 2018-09-06: qty 2

## 2018-09-06 MED ORDER — SUGAMMADEX SODIUM 200 MG/2ML IV SOLN
INTRAVENOUS | Status: DC | PRN
  Administered 2018-09-06: 200 mg via INTRAVENOUS

## 2018-09-06 MED ORDER — SODIUM CHLORIDE (PF) 0.9 % IJ SOLN
INTRAMUSCULAR | Status: AC
  Filled 2018-09-06: qty 100

## 2018-09-06 MED ORDER — FENTANYL CITRATE (PF) 250 MCG/5ML IJ SOLN
INTRAMUSCULAR | Status: AC
  Filled 2018-09-06: qty 5

## 2018-09-06 MED ORDER — PHENYLEPHRINE 40 MCG/ML (10ML) SYRINGE FOR IV PUSH (FOR BLOOD PRESSURE SUPPORT)
PREFILLED_SYRINGE | INTRAVENOUS | Status: DC | PRN
  Administered 2018-09-06 (×7): 80 ug via INTRAVENOUS

## 2018-09-06 MED ORDER — FENTANYL CITRATE (PF) 100 MCG/2ML IJ SOLN
INTRAMUSCULAR | Status: AC
  Administered 2018-09-06: 14:00:00 50 ug via INTRAVENOUS
  Filled 2018-09-06: qty 2

## 2018-09-06 MED ORDER — OXYCODONE HCL 5 MG PO TABS
5.0000 mg | ORAL_TABLET | ORAL | Status: DC | PRN
  Administered 2018-09-06 – 2018-09-07 (×3): 5 mg via ORAL
  Filled 2018-09-06 (×3): qty 1

## 2018-09-06 MED ORDER — SODIUM CHLORIDE 0.9 % IV SOLN
INTRAVENOUS | Status: AC
  Administered 2018-09-06: 15:00:00 via INTRAVENOUS

## 2018-09-06 MED ORDER — HYDROMORPHONE HCL 1 MG/ML IJ SOLN
1.0000 mg | INTRAMUSCULAR | Status: DC | PRN
  Administered 2018-09-06 – 2018-09-07 (×4): 1 mg via INTRAVENOUS
  Filled 2018-09-06 (×4): qty 1

## 2018-09-06 MED ORDER — DEXMEDETOMIDINE HCL IN NACL 200 MCG/50ML IV SOLN
INTRAVENOUS | Status: AC
  Filled 2018-09-06: qty 50

## 2018-09-06 MED ORDER — IOHEXOL 350 MG/ML SOLN
100.0000 mL | Freq: Once | INTRAVENOUS | Status: AC | PRN
  Administered 2018-09-06: 14:00:00 100 mL via INTRAVENOUS

## 2018-09-06 MED ORDER — GABAPENTIN 400 MG PO CAPS
1600.0000 mg | ORAL_CAPSULE | Freq: Three times a day (TID) | ORAL | Status: DC
  Administered 2018-09-06 – 2018-09-08 (×4): 1600 mg via ORAL
  Filled 2018-09-06 (×5): qty 4

## 2018-09-06 MED ORDER — PROPOFOL 10 MG/ML IV BOLUS
INTRAVENOUS | Status: DC | PRN
  Administered 2018-09-06: 150 mg via INTRAVENOUS
  Administered 2018-09-06: 20 mg via INTRAVENOUS

## 2018-09-06 MED ORDER — LACTATED RINGERS IV SOLN
INTRAVENOUS | Status: DC
  Administered 2018-09-06 (×2): via INTRAVENOUS

## 2018-09-06 MED ORDER — MIDAZOLAM HCL 2 MG/2ML IJ SOLN
INTRAMUSCULAR | Status: AC
  Filled 2018-09-06: qty 4

## 2018-09-06 MED ORDER — PROMETHAZINE HCL 25 MG RE SUPP: 25.0000 mg | Freq: Four times a day (QID) | RECTAL | Status: DC | PRN

## 2018-09-06 MED ORDER — ONDANSETRON HCL 4 MG/2ML IJ SOLN
INTRAMUSCULAR | Status: DC | PRN
  Administered 2018-09-06: 4 mg via INTRAVENOUS

## 2018-09-06 MED ORDER — SUCCINYLCHOLINE CHLORIDE 200 MG/10ML IV SOSY
PREFILLED_SYRINGE | INTRAVENOUS | Status: DC | PRN
  Administered 2018-09-06: 100 mg via INTRAVENOUS

## 2018-09-06 MED ORDER — ROCURONIUM BROMIDE 10 MG/ML (PF) SYRINGE
PREFILLED_SYRINGE | INTRAVENOUS | Status: DC | PRN
  Administered 2018-09-06: 10 mg via INTRAVENOUS
  Administered 2018-09-06: 20 mg via INTRAVENOUS
  Administered 2018-09-06 (×3): 10 mg via INTRAVENOUS
  Administered 2018-09-06: 20 mg via INTRAVENOUS
  Administered 2018-09-06: 40 mg via INTRAVENOUS

## 2018-09-06 MED ORDER — LIDOCAINE 2% (20 MG/ML) 5 ML SYRINGE
INTRAMUSCULAR | Status: DC | PRN
  Administered 2018-09-06: 60 mg via INTRAVENOUS

## 2018-09-06 MED ORDER — MIDAZOLAM HCL 5 MG/5ML IJ SOLN
INTRAMUSCULAR | Status: DC | PRN
  Administered 2018-09-06: 2 mg via INTRAVENOUS

## 2018-09-06 MED ORDER — PIPERACILLIN-TAZOBACTAM 3.375 G IVPB
INTRAVENOUS | Status: AC
  Filled 2018-09-06: qty 50

## 2018-09-06 MED ORDER — PIPERACILLIN-TAZOBACTAM 3.375 G IVPB
3.3750 g | Freq: Three times a day (TID) | INTRAVENOUS | Status: DC
  Administered 2018-09-06 – 2018-09-08 (×5): 3.375 g via INTRAVENOUS
  Filled 2018-09-06 (×4): qty 50

## 2018-09-06 MED ORDER — FENTANYL CITRATE (PF) 100 MCG/2ML IJ SOLN
25.0000 ug | INTRAMUSCULAR | Status: DC | PRN
  Administered 2018-09-06 (×2): 50 ug via INTRAVENOUS

## 2018-09-06 NOTE — Anesthesia Postprocedure Evaluation (Signed)
Anesthesia Post Note  Patient: [name]  Procedure(s) Performed: MICROWAVE THERMAL ABLATION (N/A )     Patient location during evaluation: PACU Anesthesia Type: General Level of consciousness: sedated Pain management: pain level controlled Vital Signs Assessment: post-procedure vital signs reviewed and stable Respiratory status: spontaneous breathing and respiratory function stable Cardiovascular status: stable Postop Assessment: no apparent nausea or vomiting Anesthetic complications: no    Last Vitals:  Vitals:   09/06/18 1350 09/06/18 1400  BP: (!) 163/87 (!) 160/84  Pulse: 90 90  Resp: 15 17  Temp:  36.6 C  SpO2: 100% 99%    Last Pain:  Vitals:   09/06/18 1400  PainSc: Asleep                 Ioannis Schuh DANIEL

## 2018-09-06 NOTE — Anesthesia Procedure Notes (Signed)
Procedure Name: Intubation Date/Time: 09/06/2018 8:53 AM Performed by: Silas [place], CRNA Pre-anesthesia Checklist: Patient identified, Emergency Drugs available, Suction available and Patient being monitored Patient Re-evaluated:Patient Re-evaluated prior to induction Oxygen Delivery Method: Circle system utilized Preoxygenation: Pre-oxygenation with 100% oxygen Induction Type: IV induction and Rapid sequence Ventilation: Mask ventilation without difficulty Laryngoscope Size: Mac and 4 Grade View: Grade I Tube type: Oral Tube size: 7.5 mm Number of attempts: 1 Airway Equipment and Method: Stylet Placement Confirmation: ETT inserted through vocal cords under direct vision,  positive ETCO2 and breath sounds checked- equal and bilateral Secured at: 23 cm Tube secured with: Tape Dental Injury: Teeth and Oropharynx as per pre-operative assessment

## 2018-09-06 NOTE — Procedures (Signed)
Interventional Radiology Procedure Note  Procedure: CT guided ablation of two separate hepatocellular carcinomas.   Complications: None.  Estimated Blood Loss: None.  Recommendations: - Admit for observation.    Signed,  Criselda Peaches, MD

## 2018-09-06 NOTE — H&P (Signed)
Referring Physician(s): Sherrill,B  Supervising Physician: [name]  Patient Status:  [facility]  TBA  Chief Complaint: Multifocal hepatocellular carcinoma   Subjective: Patient familiar to IR service from paracentesis on 06/01/2018 and consultation to discuss treatment options for liver tumors on 06/29/2018.  He has a known history of alcoholic cirrhosis with  tobacco /substance abuse, ascites, esophageal varices, hepatitis C, diabetes as well as anemia.  He also has segment 6 and segment 8 liver lesions measuring up to 2.5 cm which are highly suspicious for hepatocellular carcinomas.  He was deemed an appropriate candidate for CT guided microwave ablation of these liver tumors and presents today for the procedure.  He currently denies fever, headache, chest pain, worsening dyspnea, cough, back pain, nausea, vomiting or bleeding.  He does have abdominal discomfort/distention as well as  some diffuse itching.  Additional medical history as below.  Past Medical History:  Diagnosis Date  . Alcoholic ([place])   . Alcoholic cirrhosis of liver ([place])    Pt calls this a false diagnosis. Pt denies  . Alcoholism /alcohol abuse ([place])   . Anxiety   . Cancer ([place])   . Chronic bronchitis ([place])   . Chronic hyponatremia    pt denies having   . Depression   . Diabetes mellitus   . Diabetes mellitus without complication ([place])   . Hepatitis C   . Hyperglycemia   . Neuropathy   . Substance abuse ([place])   . Thrombocytopenia ([place])   . Tobacco use 05/31/2018   Past Surgical History:  Procedure Laterality Date  . BIOPSY  06/12/2018   Procedure: BIOPSY;  Surgeon: Mauri Pole, MD;  Location: [facility];  Service: Endoscopy;;  . COLONOSCOPY WITH PROPOFOL N/A 06/12/2018   Procedure: COLONOSCOPY WITH PROPOFOL;  Surgeon: Mauri Pole, MD;  Location: [facility];  Service: Endoscopy;  Laterality: N/A;  . ESOPHAGOGASTRODUODENOSCOPY (EGD) WITH PROPOFOL N/A 06/12/2018   Procedure:  ESOPHAGOGASTRODUODENOSCOPY (EGD) WITH PROPOFOL;  Surgeon: Mauri Pole, MD;  Location: [facility];  Service: Endoscopy;  Laterality: N/A;  . FRACTURE SURGERY  right lower leg plate placed years ago  . IR RADIOLOGIST EVAL & MGMT  06/29/2018  . Plate in lower right leg  2008      Allergies: Benadryl [diphenhydramine hcl]; Benadryl [diphenhydramine]; Sulfa antibiotics; Tegretol [carbamazepine]; Librium [chlordiazepoxide hcl]; Librium [chlordiazepoxide]; and Sulfa antibiotics  Medications: Prior to Admission medications   Medication Sig Start Date End Date Taking? Authorizing Provider  albuterol (PROVENTIL HFA;VENTOLIN HFA) 108 (90 Base) MCG/ACT inhaler Inhale 2 puffs into the lungs every 6 (six) hours as needed for wheezing or shortness of breath.   Yes [provider]  furosemide (LASIX) 40 MG tablet Take 1 tablet (40 mg total) by mouth every morning. 07/05/18  Yes Esterwood, Amy S, PA-C  gabapentin (NEURONTIN) 400 MG capsule Take 1,600 mg by mouth 3 (three) times daily.   Yes [provider]  insulin glargine (LANTUS) 100 UNIT/ML injection Inject 0.3 mLs (30 Units total) into the skin daily. Patient taking differently: Inject 30 Units into the skin daily after breakfast.  06/02/18  Yes Mercy Riding, MD  metFORMIN (GLUCOPHAGE) 1000 MG tablet Take 1 tablet (1,000 mg total) by mouth 2 (two) times daily with a meal. 11/01/15  Yes Patrecia Pour, NP  promethazine (PHENERGAN) 25 MG suppository Place 1 suppository (25 mg total) rectally every 6 (six) hours as needed for nausea or vomiting. 08/04/18  Yes Larene Pickett, PA-C  spironolactone (ALDACTONE) 50  MG tablet Take 1 tablet (50 mg total) by mouth 2 (two) times daily. 07/05/18  Yes Esterwood, Amy S, PA-C  nadolol (CORGARD) 20 MG tablet Take 1 tablet (20 mg total) by mouth daily. Patient not taking: Reported on 08/04/2018 06/12/18   Mauri Pole, MD  ondansetron (ZOFRAN ODT) 4 MG disintegrating tablet Take 1 tablet (4 mg  total) by mouth every 8 (eight) hours as needed for nausea. 08/04/18   Larene Pickett, PA-C  pantoprazole (PROTONIX) 40 MG tablet Take 1 tablet (40 mg total) by mouth daily. Patient not taking: Reported on 07/21/2018 06/12/18   Mauri Pole, MD     Vital Signs: Blood pressure 162/87, heart rate 101, temperature 98.1, respirations 18, O2 sats 100% room air   Physical Exam awake, alert.  Chest with distant breath sounds bilaterally.  Heart with slightly tachycardic but regular rhythm.  Abdomen soft, slightly distended, positive bowel sounds, currently nontender.  Bilateral lower extremity edema noted  Imaging: No results found.  Labs:  CBC: Recent Labs    07/05/18 1509 08/04/18 2014 08/31/18 1513 09/06/18 0710  WBC 14.3* 13.6* 12.6* 15.3*  HGB 9.7* 8.6* 7.3* 8.0*  HCT 28.2* 26.4* 22.2* 24.2*  PLT 367.0 394 333 415*    COAGS: Recent Labs    05/31/18 0853 07/05/18 1509 08/31/18 1513  INR 1.09 1.3* 1.2    BMP: Recent Labs    06/02/18 0421 07/05/18 1509 08/04/18 2014 08/31/18 1513 09/06/18 0710  NA 133* 128* 124* 129* 127*  K 4.1 4.2 4.2 4.3 4.5  CL 98 92* 87* 95* 93*  CO2 _0 GLUCOSE 97 223* 201* 332* 159*  BUN _1 CALCIUM 7.6* 8.0* 7.9* 7.5* 8.1*  CREATININE 0.54* 0.63 0.81 0.72 0.78  GFRNONAA >60  --  >60 >60 >60  GFRAA >60  --  >60 >60 >60    LIVER FUNCTION TESTS: Recent Labs    07/05/18 1509 08/04/18 2014 08/31/18 1513 09/06/18 0710  BILITOT 0.5 0.4 0.4 0.4  AST 25 36 33 38  ALT _2 ALKPHOS 151* 167* 163* 164*  PROT 6.9 6.7 6.7 6.9  ALBUMIN 2.2* 1.7* 1.6* 1.8*    Assessment and Plan: Pt with known history of alcoholic cirrhosis with tobacco /substance abuse, ascites, esophageal varices, hepatitis C, diabetes as well as anemia.  He also has segment 6 and segment 8 liver lesions measuring up to 2.5 cm which are highly suspicious for hepatocellular carcinomas.  He was deemed an appropriate candidate for CT  guided microwave ablation of these liver tumors following IR consultation and presents today for the procedure as well as possible paracentesis.  Details/risks of procedure, including but not limited to, internal bleeding, infection, injury to adjacent structures, anesthesia related complications discussed with patient with his understanding and consent.  Post procedure he will be admitted for overnight observation.   Electronically Signed: D. Rowe Robert, PA-C 09/06/2018, 8:30 AM   I spent a total of 30 minutes at the the patient's bedside AND on the patient's hospital floor or unit, greater than 50% of which was counseling/coordinating care for CT-guided microwave/thermal ablation of liver tumors/possible paracentesis

## 2018-09-06 NOTE — Transfer of Care (Signed)
Immediate Anesthesia Transfer of Care Note  Patient: MANOLO BOSKET  Procedure(s) Performed: MICROWAVE THERMAL ABLATION (N/A )  Patient Location: PACU  Anesthesia Type:General  Level of Consciousness: awake, oriented and patient cooperative  Airway & Oxygen Therapy: Patient Spontanous Breathing and Patient connected to face mask oxygen  Post-op Assessment: Report given to RN and Post -op Vital signs reviewed and stable  Post vital signs: Reviewed and stable  Last Vitals:  Vitals Value Taken Time  BP 163/89 09/06/2018  1:00 PM  Temp    Pulse 95 09/06/2018  1:01 PM  Resp 19 09/06/2018  1:01 PM  SpO2 100 % 09/06/2018  1:01 PM  Vitals shown include unvalidated device data.  Last Pain: There were no vitals filed for this visit.       Complications: No apparent anesthesia complications

## 2018-09-06 NOTE — Progress Notes (Signed)
Patient ID: [name], male   DOB: 1956-03-10, 63 y.o.   MRN: [id number] Patient complaining of some mild to moderate right upper quadrant discomfort, intermittent nausea, Foley catheter irritation.  Denies respiratory issues or vomiting. Afebrile, BP 158/76 Awake, alert.  Puncture sites right upper quadrant abdomen clean, dry, mildly tender to palpation; foley catheter in place draining yellow urine.  A/P: Patient with history of multifocal hepatocellular carcinoma, status post thermal ablation of segment 6 and 8 lesions earlier today; for overnight observation; check a.m. labs.  DC Foley catheter; maintain on IV Zosyn as well as Protonix.  Oxycodone for pain as needed.  Advance diet as tolerated.   [facility]

## 2018-09-07 ENCOUNTER — Encounter (HOSPITAL_COMMUNITY): Payer: Self-pay

## 2018-09-07 DIAGNOSIS — Z794 Long term (current) use of insulin: Secondary | ICD-10-CM | POA: Diagnosis not present

## 2018-09-07 DIAGNOSIS — G92 Toxic encephalopathy: Secondary | ICD-10-CM | POA: Diagnosis not present

## 2018-09-07 DIAGNOSIS — C220 Liver cell carcinoma: Principal | ICD-10-CM

## 2018-09-07 DIAGNOSIS — E1140 Type 2 diabetes mellitus with diabetic neuropathy, unspecified: Secondary | ICD-10-CM | POA: Diagnosis present

## 2018-09-07 DIAGNOSIS — Z888 Allergy status to other drugs, medicaments and biological substances status: Secondary | ICD-10-CM | POA: Diagnosis not present

## 2018-09-07 DIAGNOSIS — D638 Anemia in other chronic diseases classified elsewhere: Secondary | ICD-10-CM | POA: Diagnosis present

## 2018-09-07 DIAGNOSIS — Y92230 Patient room in hospital as the place of occurrence of the external cause: Secondary | ICD-10-CM | POA: Diagnosis not present

## 2018-09-07 DIAGNOSIS — T40605A Adverse effect of unspecified narcotics, initial encounter: Secondary | ICD-10-CM | POA: Diagnosis not present

## 2018-09-07 DIAGNOSIS — E871 Hypo-osmolality and hyponatremia: Secondary | ICD-10-CM | POA: Diagnosis present

## 2018-09-07 DIAGNOSIS — E1169 Type 2 diabetes mellitus with other specified complication: Secondary | ICD-10-CM | POA: Diagnosis not present

## 2018-09-07 DIAGNOSIS — K7031 Alcoholic cirrhosis of liver with ascites: Secondary | ICD-10-CM | POA: Diagnosis present

## 2018-09-07 DIAGNOSIS — T424X5A Adverse effect of benzodiazepines, initial encounter: Secondary | ICD-10-CM | POA: Diagnosis not present

## 2018-09-07 DIAGNOSIS — Z882 Allergy status to sulfonamides status: Secondary | ICD-10-CM | POA: Diagnosis not present

## 2018-09-07 DIAGNOSIS — B182 Chronic viral hepatitis C: Secondary | ICD-10-CM | POA: Diagnosis present

## 2018-09-07 DIAGNOSIS — F1020 Alcohol dependence, uncomplicated: Secondary | ICD-10-CM | POA: Diagnosis present

## 2018-09-07 DIAGNOSIS — F17210 Nicotine dependence, cigarettes, uncomplicated: Secondary | ICD-10-CM | POA: Diagnosis present

## 2018-09-07 DIAGNOSIS — I8510 Secondary esophageal varices without bleeding: Secondary | ICD-10-CM | POA: Diagnosis present

## 2018-09-07 DIAGNOSIS — Z79899 Other long term (current) drug therapy: Secondary | ICD-10-CM | POA: Diagnosis not present

## 2018-09-07 LAB — COMPREHENSIVE METABOLIC PANEL
ALT: 33 U/L (ref 0–44)
AST: 173 U/L — ABNORMAL HIGH (ref 15–41)
Albumin: 1.6 g/dL — ABNORMAL LOW (ref 3.5–5.0)
Alkaline Phosphatase: 149 U/L — ABNORMAL HIGH (ref 38–126)
Anion gap: 7 (ref 5–15)
BUN: 17 mg/dL (ref 8–23)
CO2: 27 mmol/L (ref 22–32)
Calcium: 7.9 mg/dL — ABNORMAL LOW (ref 8.9–10.3)
Chloride: 95 mmol/L — ABNORMAL LOW (ref 98–111)
Creatinine, Ser: 0.89 mg/dL (ref 0.61–1.24)
GFR calc Af Amer: >60 mL/min (ref >60)
GFR calc non Af Amer: >60 mL/min (ref >60)
Glucose, Bld: 189 mg/dL — ABNORMAL HIGH (ref 70–99)
Potassium: 5.2 mmol/L — ABNORMAL HIGH (ref 3.5–5.1)
Sodium: 129 mmol/L — ABNORMAL LOW (ref 135–145)
Total Bilirubin: 0.6 mg/dL (ref 0.3–1.2)
Total Protein: 6.3 g/dL — ABNORMAL LOW (ref 6.5–8.1)

## 2018-09-07 LAB — CBC
HCT: 24 % — ABNORMAL LOW (ref 39.0–52.0)
Hemoglobin: 7.8 g/dL — ABNORMAL LOW (ref 13.0–17.0)
MCH: 32 pg (ref 26.0–34.0)
MCHC: 32.5 g/dL (ref 30.0–36.0)
MCV: 98.4 fL (ref 80.0–100.0)
Platelets: 420 10*3/uL — ABNORMAL HIGH (ref 150–400)
RBC: 2.44 MIL/uL — ABNORMAL LOW (ref 4.22–5.81)
RDW: 14.2 % (ref 11.5–15.5)
WBC: 16.9 10*3/uL — ABNORMAL HIGH (ref 4.0–10.5)
nRBC: 0 % (ref 0.0–0.2)

## 2018-09-07 LAB — GLUCOSE, CAPILLARY
Glucose-Capillary: 179 mg/dL — ABNORMAL HIGH (ref 70–99)
Glucose-Capillary: 156 mg/dL — ABNORMAL HIGH (ref 70–99)
Glucose-Capillary: 131 mg/dL — ABNORMAL HIGH (ref 70–99)
Glucose-Capillary: 121 mg/dL — ABNORMAL HIGH (ref 70–99)

## 2018-09-07 LAB — LACTIC ACID, PLASMA
Lactic Acid, Venous: 0.8 mmol/L (ref 0.5–1.9)
Lactic Acid, Venous: 1.2 mmol/L (ref 0.5–1.9)

## 2018-09-07 LAB — AMMONIA: Ammonia: 19 umol/L (ref 9–35)

## 2018-09-07 LAB — TSH: TSH: 1.894 u[IU]/mL (ref 0.350–4.500)

## 2018-09-07 MED ORDER — SPIRONOLACTONE 25 MG PO TABS
50.0000 mg | ORAL_TABLET | Freq: Two times a day (BID) | ORAL | Status: DC
  Administered 2018-09-07 – 2018-09-08 (×2): 50 mg via ORAL
  Filled 2018-09-07 (×2): qty 2

## 2018-09-07 MED ORDER — INSULIN ASPART 100 UNIT/ML ~~LOC~~ SOLN
0.0000 [IU] | Freq: Three times a day (TID) | SUBCUTANEOUS | Status: DC
  Administered 2018-09-07 (×2): 2 [IU] via SUBCUTANEOUS
  Administered 2018-09-08: 09:00:00 3 [IU] via SUBCUTANEOUS

## 2018-09-07 MED ORDER — SODIUM CHLORIDE 0.9% FLUSH: 10.0000 mL | INTRAVENOUS | Status: DC | PRN

## 2018-09-07 MED ORDER — SODIUM CHLORIDE 0.9% FLUSH
10.0000 mL | Freq: Two times a day (BID) | INTRAVENOUS | Status: DC
  Administered 2018-09-07 (×2): 10 mL

## 2018-09-07 MED ORDER — LORAZEPAM 1 MG PO TABS
2.0000 mg | ORAL_TABLET | Freq: Once | ORAL | Status: AC
  Administered 2018-09-07: 06:00:00 2 mg via ORAL
  Filled 2018-09-07: qty 2

## 2018-09-07 NOTE — Progress Notes (Signed)
Referring Physician(s): Dr. [name]  Supervising Physician: [name]  Patient Status:  [facility] - In-pt  Chief Complaint: Hepatocellular carcinoma  Subjective: Patient remains confused and drowsy this afternoon. Is able to awaken and respond to questions, however with difficulty staying on topic. Falls asleep easily.     Allergies: Benadryl [diphenhydramine hcl]; Benadryl [diphenhydramine]; Sulfa antibiotics; Tegretol [carbamazepine]; Librium [chlordiazepoxide hcl]; Librium [chlordiazepoxide]; and Sulfa antibiotics  Medications: Prior to Admission medications   Medication Sig Start Date End Date Taking? Authorizing Provider  albuterol (PROVENTIL HFA;VENTOLIN HFA) 108 (90 Base) MCG/ACT inhaler Inhale 2 puffs into the lungs every 6 (six) hours as needed for wheezing or shortness of breath.   Yes [provider]  furosemide (LASIX) 40 MG tablet Take 1 tablet (40 mg total) by mouth every morning. 07/05/18  Yes Esterwood, Amy S, PA-C  gabapentin (NEURONTIN) 400 MG capsule Take 1,600 mg by mouth 3 (three) times daily.   Yes [provider]  insulin glargine (LANTUS) 100 UNIT/ML injection Inject 0.3 mLs (30 Units total) into the skin daily. Patient taking differently: Inject 30 Units into the skin daily after breakfast.  06/02/18  Yes Mercy Riding, MD  metFORMIN (GLUCOPHAGE) 1000 MG tablet Take 1 tablet (1,000 mg total) by mouth 2 (two) times daily with a meal. 11/01/15  Yes Patrecia Pour, NP  promethazine (PHENERGAN) 25 MG suppository Place 1 suppository (25 mg total) rectally every 6 (six) hours as needed for nausea or vomiting. 08/04/18  Yes Larene Pickett, PA-C  spironolactone (ALDACTONE) 50 MG tablet Take 1 tablet (50 mg total) by mouth 2 (two) times daily. 07/05/18  Yes Esterwood, Amy S, PA-C  ondansetron (ZOFRAN ODT) 4 MG disintegrating tablet Take 1 tablet (4 mg total) by mouth every 8 (eight) hours as needed for nausea. 08/04/18   Larene Pickett, PA-C    pantoprazole (PROTONIX) 40 MG tablet Take 1 tablet (40 mg total) by mouth daily. Patient not taking: Reported on 07/21/2018 06/12/18   Mauri Pole, MD     Vital Signs: BP (!) 149/69 (BP Location: Right Arm)    Pulse (!) 110    Temp 98.9 F (37.2 C) (Oral)    Resp 18    SpO2 99%   Physical Exam Vitals signs and nursing note reviewed.   NAD, drowsy, awakens to touch but quickly falls asleep Abdomen: site intact. Puncture site c/d. No bruising.    Imaging: Ct Guide Tissue Ablation  Result Date: 09/06/2018 INDICATION: 63 year old male with alcoholic and HCV cirrhosis complicated by multifocal hepatocellular carcinoma. Discrete lesions are identified in the posteromedial aspect of hepatic segment 6 as well as subcapsular in hepatic segment 8. He presents today for percutaneous thermal ablation. EXAM: Microwave ablation of liver lesion using ultrasound and CT guidance COMPARISON:  CT abdomen/pelvis 08/04/2018; MRI abdomen 05/31/2018 MEDICATIONS: 3.375 g Zosyn; The antibiotic was administered in an appropriate time interval prior to needle puncture of the skin. ANESTHESIA/SEDATION: General - as administered by the Anesthesia department FLUOROSCOPY TIME:  None. COMPLICATIONS: None immediate. TECHNIQUE: Informed written consent was obtained from the patient after a thorough discussion of the procedural risks, benefits and alternatives. All questions were addressed. Maximal Sterile Barrier Technique was utilized including caps, mask, sterile gowns, sterile gloves, sterile drape, hand hygiene and skin antiseptic. A timeout was performed prior to the initiation of the procedure. A planning axial CT scan of the liver was performed. Images were obtained without contrast and with contrast in the arterial and portal venous  phases. The lesions were successfully identified. The subcapsular segment 8 lesion measures approximately 2.7 x 2.7 cm. The more ill-defined lesion in the posteromedial aspect of hepatic  segment six is much less well-defined and measures approximately 4.2 x 3.0 cm. Ultrasound was then used to interrogate the abdomen. The subcapsular lesion in hepatic segment 8 is easily visualized. Mild perihepatic ascites. The lesion in the posteromedial aspect of segment 6 was difficult to visualize. Attention was first turned to the subcapsular segment 8 lesion. Utilizing real-time ultrasound guidance, two 15 cm PR XT antennae were carefully advanced through the margin of the liver and used to bracket the lesion in a wedge-shaped configuration. Probe placement was confirmed by CT imaging. Care was taken to avoid the inferior aspect of the right lung. Ablation was then performed. Both antennae were powered at 65 watts and the ablation was maintained for 8 minutes. The probes were then removed. Attention was turned to the lesion in the posterior and medial aspect of segment 6. Utilizing CT imaging, a total of three 15 cm PR XT antennae were advanced into the mass. Ablation was then performed with all 3 antennae powered at 65 watts. The ablation was maintained for 10 minutes. Tract ablation was performed while removing the probes. Repeat CT imaging of the abdomen was then performed with intravenous contrast. The ablation zones appear to adequately cover the lesion sites. No evidence of active hemorrhage or other complication. FINDINGS: Cirrhotic liver with mild perihepatic ascites. IMPRESSION: Technically successful microwave ablation of hepatocellular carcinoma in hepatic segments 8 and 6. PLAN: Follow-up MR imaging of the abdomen in 3 months accompanied by clinic visit with Dr. Kathlene Cote. Signed, Criselda Peaches, MD, [facility] Electronically Signed   By: [name] M.D.   On: 09/06/2018 14:44    Labs:  CBC: Recent Labs    08/04/18 2014 08/31/18 1513 09/06/18 0710 09/07/18 0427  WBC 13.6* 12.6* 15.3* 16.9*  HGB 8.6* 7.3* 8.0*  7.8*  HCT 26.4* 22.2* 24.2* 24.0*  PLT 394 333 415* 420*    COAGS: Recent Labs    05/31/18 0853 07/05/18 1509 08/31/18 1513  INR 1.09 1.3* 1.2    BMP: Recent Labs    08/04/18 2014 08/31/18 1513 09/06/18 0710 09/07/18 0427  NA 124* 129* 127* 129*  K 4.2 4.3 4.5 5.2*  CL 87* 95* 93* 95*  CO2 _0 GLUCOSE 201* 332* 159* 189*  BUN _1 CALCIUM 7.9* 7.5* 8.1* 7.9*  CREATININE 0.81 0.72 0.78 0.89  GFRNONAA >60 >60 >60 >60  GFRAA >60 >60 >60 >60    LIVER FUNCTION TESTS: Recent Labs    08/04/18 2014 08/31/18 1513 09/06/18 0710 09/07/18 0427  BILITOT 0.4 0.4 0.4 0.6  AST 36 33 38 173*  ALT _2 33  ALKPHOS 167* 163* 164* 149*  PROT 6.7 6.7 6.9 6.3*  ALBUMIN 1.7* 1.6* 1.8* 1.6*    Assessment and Plan: Hepatocellular carcinoma s/p microwave ablation 4/29 with Dr. Laurence Ferrari Patient s/p successful MWA yesterday.  He was admitted overnight for observation.  Patient with some mild agitation after procedure yesterday which worsened overnight.  He was given 41m Ativan this AM for agitation and was able to rest, however has remained drowsy with some confusion today.  Patient assessed this afternoon.  He has successfully voided and tolerated a few bites of food, however remains mildly confused and drowsy.  Denies recent alcohol use.  No known  history of dementia or delirium.  Have requested assistance from hospitalist service in managing acute delirium.  Patient remains stable with regard to his procedure yesterday.  Electronically Signed: Docia Barrier, PA 09/07/2018, 3:07 PM   I spent a total of 25 Minutes at the the patient's bedside AND on the patient's hospital floor or unit, greater than 50% of which was counseling/coordinating care for hepatocellular carcinoma.

## 2018-09-07 NOTE — Progress Notes (Signed)
Pt refusing I and O cath at this time will continue to monitor.

## 2018-09-07 NOTE — Consult Note (Signed)
Consult   [name]:[id number] DOB: 27-Jun-1955 DOA: 09/06/2018  PCP: Marliss Coots, NP Patient coming from: [facility]  Chief Complaint: For IR procedure  HPI: [name] is a 63 y.o. male with medical history significant of cirrhosis secondary to alcohol use and hepatitis C, diabetes mellitus type 2, anemia of chronic disease, hepatocellular carcinoma was admitted to the hospital for thermal ablation of hepatocellular carcinoma.  Patient did well with the procedure yesterday but overnight became agitated concerns for alcohol withdrawal and also reported of pain therefore he was given 2 mg of IV Ativan and oxycodone.  Since then patient has been more drowsy but no focal neuro exam has been appreciated.  Medical team was requested to see the patient.  When I saw the patient he was easily arousable to verbal stimuli and was able to keep up with basic conversation.  He was able to tell me his name and place but unable to tell me date.  He would drift back to being sleepy right after.  Had bilateral pinpoint pupils.  Unable to provide me with detailed history due to drowsiness at this time.   Review of Systems: As per HPI otherwise 10 point review of systems negative.  Review of Systems Otherwise negative except as per HPI, including: General: Denies fever, chills, night sweats or unintended weight loss. Resp: Denies cough, wheezing, shortness of breath. Cardiac: Denies chest pain, palpitations, orthopnea, paroxysmal nocturnal dyspnea. GI: Denies abdominal pain, nausea, vomiting, diarrhea or constipation GU: Denies dysuria, frequency, hesitancy or incontinence MS: Denies muscle aches, joint pain or swelling Neuro: Denies headache, neurologic deficits (focal weakness, numbness, tingling), abnormal gait Psych: Denies anxiety, depression, SI/HI/AVH Skin: Denies new rashes or lesions ID: Denies sick contacts, exotic exposures, travel  Past Medical History:  Diagnosis Date  .  Alcoholic ([place])   . Alcoholic cirrhosis of liver ([place])    Pt calls this a false diagnosis. Pt denies  . Alcoholism /alcohol abuse ([place])   . Anxiety   . Cancer ([place])   . Chronic bronchitis ([place])   . Chronic hyponatremia    pt denies having   . Depression   . Diabetes mellitus   . Diabetes mellitus without complication ([place])   . Hepatitis C   . Hyperglycemia   . Neuropathy   . Substance abuse ([place])   . Thrombocytopenia ([place])   . Tobacco use 05/31/2018    Past Surgical History:  Procedure Laterality Date  . BIOPSY  06/12/2018   Procedure: BIOPSY;  Surgeon: Mauri Pole, MD;  Location: [facility];  Service: Endoscopy;;  . COLONOSCOPY WITH PROPOFOL N/A 06/12/2018   Procedure: COLONOSCOPY WITH PROPOFOL;  Surgeon: Mauri Pole, MD;  Location: [facility];  Service: Endoscopy;  Laterality: N/A;  . ESOPHAGOGASTRODUODENOSCOPY (EGD) WITH PROPOFOL N/A 06/12/2018   Procedure: ESOPHAGOGASTRODUODENOSCOPY (EGD) WITH PROPOFOL;  Surgeon: Mauri Pole, MD;  Location: [facility];  Service: Endoscopy;  Laterality: N/A;  . FRACTURE SURGERY  right lower leg plate placed years ago  . IR RADIOLOGIST EVAL & MGMT  06/29/2018  . Plate in lower right leg  2008  . RADIOFREQUENCY ABLATION N/A 09/06/2018   Procedure: MICROWAVE THERMAL ABLATION;  Surgeon: Aletta Edouard, MD;  Location: [facility];  Service: Anesthesiology;  Laterality: N/A;    SOCIAL HISTORY:  reports that he has been smoking cigarettes. He has a 20.00 pack-year smoking history. He has never used smokeless tobacco. He reports previous alcohol use. He reports current drug use. Frequency: 3.00 times per  week. Drug: Marijuana.  Allergies  Allergen Reactions  . Benadryl [Diphenhydramine Hcl] Other (See Comments)    "Causes nervousness"  . Benadryl [Diphenhydramine] Other (See Comments)    "makes my skin crawl"  . Sulfa Antibiotics Other (See Comments)    Childhood allergy  . Tegretol [Carbamazepine] Other (See Comments)     "almost killed me"  . Librium [Chlordiazepoxide Hcl] Anxiety  . Librium [Chlordiazepoxide] Anxiety  . Sulfa Antibiotics Rash    FAMILY HISTORY: Family History  Problem Relation Age of Onset  . ALS Father   . Colon cancer Neg Hx   . Colon polyps Neg Hx   . Esophageal cancer Neg Hx   . Rectal cancer Neg Hx   . Stomach cancer Neg Hx      Prior to Admission medications   Medication Sig Start Date End Date Taking? Authorizing Provider  albuterol (PROVENTIL HFA;VENTOLIN HFA) 108 (90 Base) MCG/ACT inhaler Inhale 2 puffs into the lungs every 6 (six) hours as needed for wheezing or shortness of breath.   Yes [provider]  furosemide (LASIX) 40 MG tablet Take 1 tablet (40 mg total) by mouth every morning. 07/05/18  Yes Esterwood, Amy S, PA-C  gabapentin (NEURONTIN) 400 MG capsule Take 1,600 mg by mouth 3 (three) times daily.   Yes [provider]  insulin glargine (LANTUS) 100 UNIT/ML injection Inject 0.3 mLs (30 Units total) into the skin daily. Patient taking differently: Inject 30 Units into the skin daily after breakfast.  06/02/18  Yes Mercy Riding, MD  metFORMIN (GLUCOPHAGE) 1000 MG tablet Take 1 tablet (1,000 mg total) by mouth 2 (two) times daily with a meal. 11/01/15  Yes Patrecia Pour, NP  promethazine (PHENERGAN) 25 MG suppository Place 1 suppository (25 mg total) rectally every 6 (six) hours as needed for nausea or vomiting. 08/04/18  Yes Larene Pickett, PA-C  spironolactone (ALDACTONE) 50 MG tablet Take 1 tablet (50 mg total) by mouth 2 (two) times daily. 07/05/18  Yes Esterwood, Amy S, PA-C  ondansetron (ZOFRAN ODT) 4 MG disintegrating tablet Take 1 tablet (4 mg total) by mouth every 8 (eight) hours as needed for nausea. 08/04/18   Larene Pickett, PA-C  pantoprazole (PROTONIX) 40 MG tablet Take 1 tablet (40 mg total) by mouth daily. Patient not taking: Reported on 07/21/2018 06/12/18   Mauri Pole, MD    Physical Exam: Vitals:   09/07/18 0730 09/07/18  0800 09/07/18 0945 09/07/18 1337  BP: 135/76  (!) 142/72 (!) 149/69  Pulse: (!) 120  (!) 115 (!) 110  Resp: _0 Temp:   98.4 F (36.9 C) 98.9 F (37.2 C)  TempSrc:   Oral Oral  SpO2: (!) 84% 94% 98% 99%      Constitutional: Drowsy but responsive to verbal stimuli Eyes: Bilateral pinpoint pupils ENMT: Mucous membranes are moist. Posterior pharynx clear of any exudate or lesions.Normal dentition.  Neck: normal, supple, no masses, no thyromegaly Respiratory: clear to auscultation bilaterally, no wheezing, no crackles. Normal respiratory effort. No accessory muscle use.  Cardiovascular: Regular rate and rhythm, no murmurs / rubs / gallops. No extremity edema. 2+ pedal pulses. No carotid bruits.  Abdomen: no tenderness, no masses palpated. No hepatosplenomegaly. Bowel sounds positive.  Musculoskeletal: no clubbing / cyanosis. No joint deformity upper and lower extremities. Good ROM, no contractures. Normal muscle tone.  Skin: no rashes, lesions, ulcers. No induration Neurologic: CN 2-12 grossly intact. Sensation intact, DTR normal. Strength 4/5 in all 4.  No focal neuro deficits Psychiatric: Poor judgment.  Alert and oriented to name and place but not to today's date.    Labs on Admission: I have personally reviewed following labs and imaging studies  CBC: Recent Labs  Lab 09/06/18 0710 09/07/18 0427  WBC 15.3* 16.9*  NEUTROABS 10.4*  --   HGB 8.0* 7.8*  HCT 24.2* 24.0*  MCV 96.0 98.4  PLT 415* 026*   Basic Metabolic Panel: Recent Labs  Lab 09/06/18 0710 09/07/18 0427  NA 127* 129*  K 4.5 5.2*  CL 93* 95*  CO2 27 27  GLUCOSE 159* 189*  BUN 16 17  CREATININE 0.78 0.89  CALCIUM 8.1* 7.9*   GFR: Estimated Creatinine Clearance: 95.7 mL/min (by C-G formula based on SCr of 0.89 mg/dL). Liver Function Tests: Recent Labs  Lab 09/06/18 0710 09/07/18 0427  AST 38 173*  ALT 17 33  ALKPHOS 164* 149*  BILITOT 0.4 0.6  PROT 6.9 6.3*  ALBUMIN 1.8* 1.6*   No  results for input(s): LIPASE, AMYLASE in the last 168 hours. Recent Labs  Lab 09/07/18 1535  AMMONIA 19   Coagulation Profile: No results for input(s): INR, PROTIME in the last 168 hours. Cardiac Enzymes: No results for input(s): CKTOTAL, CKMB, CKMBINDEX, TROPONINI in the last 168 hours. BNP (last 3 results) No results for input(s): PROBNP in the last 8760 hours. HbA1C: No results for input(s): HGBA1C in the last 72 hours. CBG: Recent Labs  Lab 09/06/18 1310 09/06/18 1658 09/06/18 2109 09/07/18 0724 09/07/18 1152  GLUCAP 134* 157* 160* 179* 156*   Lipid Profile: No results for input(s): CHOL, HDL, LDLCALC, TRIG, CHOLHDL, LDLDIRECT in the last 72 hours. Thyroid Function Tests: No results for input(s): TSH, T4TOTAL, FREET4, T3FREE, THYROIDAB in the last 72 hours. Anemia Panel: No results for input(s): VITAMINB12, FOLATE, FERRITIN, TIBC, IRON, RETICCTPCT in the last 72 hours. Urine analysis:    Component Value Date/Time   COLORURINE YELLOW 08/04/2018 2014   APPEARANCEUR CLEAR 08/04/2018 2014   APPEARANCEUR Clear 10/13/2012 2130   LABSPEC 1.018 08/04/2018 2014   LABSPEC 1.002 10/13/2012 2130   PHURINE 5.0 08/04/2018 2014   GLUCOSEU 50 (A) 08/04/2018 2014   GLUCOSEU >=500 10/13/2012 2130   HGBUR MODERATE (A) 08/04/2018 2014   BILIRUBINUR NEGATIVE 08/04/2018 2014   BILIRUBINUR Negative 10/13/2012 2130   [place] NEGATIVE 08/04/2018 2014   PROTEINUR 100 (A) 08/04/2018 2014   UROBILINOGEN 0.2 11/06/2014 1910   NITRITE NEGATIVE 08/04/2018 2014   LEUKOCYTESUR NEGATIVE 08/04/2018 2014   LEUKOCYTESUR Negative 10/13/2012 2130   Sepsis Labs: !!!!!!!!!!!!!!!!!!!!!!!!!!!!!!!!!!!!!!!!!!!! _0 (procalcitonin:4,lacticidven:4) )No results found for this or any previous visit (from the past 240 hour(s)).   Radiological Exams on Admission: Ct Guide Tissue Ablation  Result Date: 09/06/2018 INDICATION: 63 year old male with alcoholic and HCV cirrhosis complicated by  multifocal hepatocellular carcinoma. Discrete lesions are identified in the posteromedial aspect of hepatic segment 6 as well as subcapsular in hepatic segment 8. He presents today for percutaneous thermal ablation. EXAM: Microwave ablation of liver lesion using ultrasound and CT guidance COMPARISON:  CT abdomen/pelvis 08/04/2018; MRI abdomen 05/31/2018 MEDICATIONS: 3.375 g Zosyn; The antibiotic was administered in an appropriate time interval prior to needle puncture of the skin. ANESTHESIA/SEDATION: General - as administered by the Anesthesia department FLUOROSCOPY TIME:  None. COMPLICATIONS: None immediate. TECHNIQUE: Informed written consent was obtained from the patient after a thorough discussion of the procedural risks, benefits and alternatives. All questions were addressed. Maximal Sterile Barrier Technique was utilized including caps, mask, sterile gowns, sterile  gloves, sterile drape, hand hygiene and skin antiseptic. A timeout was performed prior to the initiation of the procedure. A planning axial CT scan of the liver was performed. Images were obtained without contrast and with contrast in the arterial and portal venous phases. The lesions were successfully identified. The subcapsular segment 8 lesion measures approximately 2.7 x 2.7 cm. The more ill-defined lesion in the posteromedial aspect of hepatic segment six is much less well-defined and measures approximately 4.2 x 3.0 cm. Ultrasound was then used to interrogate the abdomen. The subcapsular lesion in hepatic segment 8 is easily visualized. Mild perihepatic ascites. The lesion in the posteromedial aspect of segment 6 was difficult to visualize. Attention was first turned to the subcapsular segment 8 lesion. Utilizing real-time ultrasound guidance, two 15 cm PR XT antennae were carefully advanced through the margin of the liver and used to bracket the lesion in a wedge-shaped configuration. Probe placement was confirmed by CT imaging. Care was  taken to avoid the inferior aspect of the right lung. Ablation was then performed. Both antennae were powered at 65 watts and the ablation was maintained for 8 minutes. The probes were then removed. Attention was turned to the lesion in the posterior and medial aspect of segment 6. Utilizing CT imaging, a total of three 15 cm PR XT antennae were advanced into the mass. Ablation was then performed with all 3 antennae powered at 65 watts. The ablation was maintained for 10 minutes. Tract ablation was performed while removing the probes. Repeat CT imaging of the abdomen was then performed with intravenous contrast. The ablation zones appear to adequately cover the lesion sites. No evidence of active hemorrhage or other complication. FINDINGS: Cirrhotic liver with mild perihepatic ascites. IMPRESSION: Technically successful microwave ablation of hepatocellular carcinoma in hepatic segments 8 and 6. PLAN: Follow-up MR imaging of the abdomen in 3 months accompanied by clinic visit with Dr. Kathlene Cote. Signed, Criselda Peaches, MD, [facility] Radiology Electronically Signed   By: [name] M.D.   On: 09/06/2018 14:44     All images have been reviewed by me personally.    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  -Suspect this is likely secondary to getting Ativan and narcotic.  At this time would minimize narcotic use.  Use benzos only as necessary if she was significant signs of alcohol withdrawal.  Neuro exam is nonfocal and patient is easily arousable and carries on basic  conversation therefore hold off on CT head and ABG. Mentation not at baseline (currently AAOx2, baseline AAOx3) -Continue neurochecks.  Supportive care.  Will await medications to washout and follow him clinically. -Closely monitor blood glucose level.  Hepatocellular carcinoma status post thermal ablation -Underwent thermal ablation by interventional radiology.  On Zosyn  Cirrhosis secondary to chronic alcohol use and hepatitis C -Status post treatment with Epclusa.  Alcohol withdrawal protocol.  Diabetes mellitus type 2 -Sliding scale Accu-Chek TID AC & QHS  Chronic hyponatremia -Around his baseline of 130.  Closely monitor sodium level.  Anemia of chronic disease  -Secondary to underlying cirrhosis.  Continue to monitor.  DVT prophylaxis: Per primary  team Code Status: Full code    Time Spent: 65 minutes.  >50% of the time was devoted to discussing the patients care, assessment, plan and disposition with other care givers along with counseling the patient about the risks and benefits of treatment.    Ankit Arsenio Loader MD [facility]  If 7PM-7AM, please contact night-coverage [email or web address]  09/07/2018, 4:29 PM

## 2018-09-07 NOTE — Progress Notes (Addendum)
Around  0400 patient began to get agitated and irritable. On assessment patient is A&Ox 4, patient beginning to have tremors, confusion and agitation. On call notified of MEWs of yellow (pulse and BP), Increased agitation, bladder scan showing 373m, patient only had 1077m within past eight hours, (patient stated feeling the need t void), CBG (189), patient excessively scratching until bleeding and that patient pulled both IVs. Patient is now in bed with bed alarm on, and room is near the nurses station. On call did not want another IV placed right now. New order to administer Ativan x1. On call wants to wait an hour after ativan to do straight cath.   TiNorlene DuelN, BSN

## 2018-09-07 NOTE — Progress Notes (Signed)
Pharmacy contacted for ativan ordered compatibility and it's okay to give.   Norlene Duel RN, BSN

## 2018-09-07 NOTE — Progress Notes (Signed)
PA to bedside this AM with Dr. Laurence Ferrari. Patient had an eventful night with agitation- sedation vs. H/o of alcoholic cirrhosis? Patient sleeping soundly.  Does not arouse to voice. Patient was just given Ativan this AM due to agitation overnight.  Did not attempt to awaken further and will plan to reassess later today.  VSS. Discussed plan of care with RN.  Hopeful for discharge later today if patient stable and mental status improved.  Plan as of yesterday was for discharge home in care of ex-wife.   Brynda Greathouse, MS RD PA-C 9:06 AM

## 2018-09-08 DIAGNOSIS — E871 Hypo-osmolality and hyponatremia: Secondary | ICD-10-CM

## 2018-09-08 DIAGNOSIS — Z794 Long term (current) use of insulin: Secondary | ICD-10-CM

## 2018-09-08 DIAGNOSIS — E1169 Type 2 diabetes mellitus with other specified complication: Secondary | ICD-10-CM

## 2018-09-08 DIAGNOSIS — K7031 Alcoholic cirrhosis of liver with ascites: Secondary | ICD-10-CM

## 2018-09-08 LAB — GLUCOSE, CAPILLARY
Glucose-Capillary: 159 mg/dL — ABNORMAL HIGH (ref 70–99)
Glucose-Capillary: 194 mg/dL — ABNORMAL HIGH (ref 70–99)

## 2018-09-08 MED ORDER — HYDROCORTISONE NA SUCCINATE PF 100 MG IJ SOLR
25.0000 mg | Freq: Once | INTRAMUSCULAR | Status: AC
  Administered 2018-09-08: 14:00:00 25 mg via INTRAVENOUS
  Filled 2018-09-08: qty 0.5

## 2018-09-08 MED ORDER — PANTOPRAZOLE SODIUM 40 MG PO TBEC: 40.0000 mg | DELAYED_RELEASE_TABLET | Freq: Two times a day (BID) | ORAL | Status: DC

## 2018-09-08 NOTE — Progress Notes (Signed)
PROGRESS NOTE/CONSULT    [name]  TIR:[id number] DOB: [birth year] DOA: 09/06/2018 PCP: Marliss Coots, NP    Brief Narrative:  [name] is a 63 y.o. male with medical history significant of cirrhosis secondary to alcohol use and hepatitis C, diabetes mellitus type 2, anemia of chronic disease, hepatocellular carcinoma was admitted to the hospital for thermal ablation of hepatocellular carcinoma.  Patient did well with the procedure yesterday but overnight became agitated concerns for alcohol withdrawal and also reported of pain therefore he was given 2 mg of IV Ativan and oxycodone.  Since then patient has been more drowsy but no focal neuro exam has been appreciated.  Medical team was requested to see the patient.  When I saw the patient he was easily arousable to verbal stimuli and was able to keep up with basic conversation.  He was able to tell me his name and place but unable to tell me date.  He would drift back to being sleepy right after.  Had bilateral pinpoint pupils.  Unable to provide me with detailed history due to drowsiness at this time.   Assessment & Plan:   Principal Problem:   Cancer, hepatocellular ([place]) Active Problems:   Diabetes mellitus ([place])   Chronic hyponatremia   Alcohol dependence (H[facility])   Moderate major depression (H[facility])   Chronic hepatitis C ([place])   Type 2 diabetes mellitus (HCC)   Alcoholic cirrhosis of liver with ascites ([place])   Hepatocellular carcinoma ([place])   #1 acute metabolic encephalopathy Secondary to getting Ativan and narcotics.  Patient alert oriented to self place and time.  Mentation has improved significantly.  Patient likely close to baseline.  Neurological exam was unremarkable.  Continue supportive care.  No further recommendations at this time.  Outpatient follow-up.  Recommend minimizing narcotic use.  Benzos as needed.  2.  Hepatocellular carcinoma status post thermal ablation Per primary team.  On IV Zosyn.  3.   Cirrhosis secondary to alcohol use and hepatitis C Status post treatment with Epclusa.  Patient with no signs of alcohol withdrawal on examination.   4.  Diabetes mellitus type 2 Hemoglobin A1c 8.0.  CBG of 159 this morning.  Continue sliding scale insulin.  5.  Chronic hyponatremia Stable.  6.  Anemia of chronic disease Likely secondary to underlying cirrhosis.  Patient's mental status currently stable and likely close to baseline.  Patient alert oriented to self place and time.  Knows who the president is.  No focal neurological deficits.  Patient currently stable for discharge from a medicine standpoint.   DVT prophylaxis: Per primary team. Code Status: Full Family Communication: Updated patient. Disposition Plan:  Stable from medicine point of view for d/c. Per primary team.    Consultants:   [facility] hospitalist Dr. Reesa Chew 09/07/2018  Procedures:  Microwave ablation of hepatocellular carcinoma per Dr. Laurence Ferrari 09/06/2018  Antimicrobials:   IV Zosyn 09/06/2018   Subjective: Patient laying in bed.  Patient alert and oriented to self place and time.  Following commands appropriately.  Denies any chest pain.  No shortness of breath.  No abdominal pain.  Asking whether he can go home.  Objective: Vitals:   09/07/18 1717 09/07/18 2152 09/07/18 2351 09/08/18 0434  BP: (!) 145/73 111/68  (!) 152/93  Pulse: (!) 109 (!) 123 (!) 110 83  Resp: _0 Temp: 99.2 F (37.3 C) 98.7 F (37.1 C)  98.3 F (36.8 C)  TempSrc: Oral Oral  Oral  SpO2: 93% 91%  97% 90%  Weight:   85.1 kg   Height:   6' (1.829 m)     Intake/Output Summary (Last 24 hours) at 09/08/2018 1145 Last data filed at 09/08/2018 0956 Gross per 24 hour  Intake 796.91 ml  Output 775 ml  Net 21.91 ml   Filed Weights   09/07/18 2351  Weight: 85.1 kg    Examination:  General exam: Appears calm and comfortable  Respiratory system: Clear to auscultation. Respiratory effort normal. Cardiovascular system: S1 &  S2 heard, RRR. No JVD, murmurs, rubs, gallops or clicks. No pedal edema. Gastrointestinal system: Abdomen is mildly distended, soft, nontender to palpation, positive bowel sounds.  No rebound.  No guarding. Central nervous system: Alert and oriented. No focal neurological deficits. Extremities: Symmetric 5 x 5 power. Skin: No rashes, lesions or ulcers Psychiatry: Judgement and insight appear normal. Mood & affect appropriate.     Data Reviewed: I have personally reviewed following labs and imaging studies  CBC: Recent Labs  Lab 09/06/18 0710 09/07/18 0427  WBC 15.3* 16.9*  NEUTROABS 10.4*  --   HGB 8.0* 7.8*  HCT 24.2* 24.0*  MCV 96.0 98.4  PLT 415* 283*   Basic Metabolic Panel: Recent Labs  Lab 09/06/18 0710 09/07/18 0427  NA 127* 129*  K 4.5 5.2*  CL 93* 95*  CO2 27 27  GLUCOSE 159* 189*  BUN 16 17  CREATININE 0.78 0.89  CALCIUM 8.1* 7.9*   GFR: Estimated Creatinine Clearance: 95.7 mL/min (by C-G formula based on SCr of 0.89 mg/dL). Liver Function Tests: Recent Labs  Lab 09/06/18 0710 09/07/18 0427  AST 38 173*  ALT 17 33  ALKPHOS 164* 149*  BILITOT 0.4 0.6  PROT 6.9 6.3*  ALBUMIN 1.8* 1.6*   No results for input(s): LIPASE, AMYLASE in the last 168 hours. Recent Labs  Lab 09/07/18 1535  AMMONIA 19   Coagulation Profile: No results for input(s): INR, PROTIME in the last 168 hours. Cardiac Enzymes: No results for input(s): CKTOTAL, CKMB, CKMBINDEX, TROPONINI in the last 168 hours. BNP (last 3 results) No results for input(s): PROBNP in the last 8760 hours. HbA1C: No results for input(s): HGBA1C in the last 72 hours. CBG: Recent Labs  Lab 09/07/18 0724 09/07/18 1152 09/07/18 1657 09/07/18 2208 09/08/18 0752  GLUCAP 179* 156* 131* 121* 159*   Lipid Profile: No results for input(s): CHOL, HDL, LDLCALC, TRIG, CHOLHDL, LDLDIRECT in the last 72 hours. Thyroid Function Tests: Recent Labs    09/07/18 1535  TSH 1.894   Anemia Panel: No  results for input(s): VITAMINB12, FOLATE, FERRITIN, TIBC, IRON, RETICCTPCT in the last 72 hours. Sepsis Labs: Recent Labs  Lab 09/07/18 1535 09/07/18 1817  LATICACIDVEN 0.8 1.2    No results found for this or any previous visit (from the past 240 hour(s)).       Radiology Studies: Ct Guide Tissue Ablation  Result Date: 09/06/2018 INDICATION: 63 year old male with alcoholic and HCV cirrhosis complicated by multifocal hepatocellular carcinoma. Discrete lesions are identified in the posteromedial aspect of hepatic segment 6 as well as subcapsular in hepatic segment 8. He presents today for percutaneous thermal ablation. EXAM: Microwave ablation of liver lesion using ultrasound and CT guidance COMPARISON:  CT abdomen/pelvis 08/04/2018; MRI abdomen 05/31/2018 MEDICATIONS: 3.375 g Zosyn; The antibiotic was administered in an appropriate time interval prior to needle puncture of the skin. ANESTHESIA/SEDATION: General - as administered by the Anesthesia department FLUOROSCOPY TIME:  None. COMPLICATIONS: None immediate. TECHNIQUE: Informed written consent was obtained from the  patient after a thorough discussion of the procedural risks, benefits and alternatives. All questions were addressed. Maximal Sterile Barrier Technique was utilized including caps, mask, sterile gowns, sterile gloves, sterile drape, hand hygiene and skin antiseptic. A timeout was performed prior to the initiation of the procedure. A planning axial CT scan of the liver was performed. Images were obtained without contrast and with contrast in the arterial and portal venous phases. The lesions were successfully identified. The subcapsular segment 8 lesion measures approximately 2.7 x 2.7 cm. The more ill-defined lesion in the posteromedial aspect of hepatic segment six is much less well-defined and measures approximately 4.2 x 3.0 cm. Ultrasound was then used to interrogate the abdomen. The subcapsular lesion in hepatic segment 8 is  easily visualized. Mild perihepatic ascites. The lesion in the posteromedial aspect of segment 6 was difficult to visualize. Attention was first turned to the subcapsular segment 8 lesion. Utilizing real-time ultrasound guidance, two 15 cm PR XT antennae were carefully advanced through the margin of the liver and used to bracket the lesion in a wedge-shaped configuration. Probe placement was confirmed by CT imaging. Care was taken to avoid the inferior aspect of the right lung. Ablation was then performed. Both antennae were powered at 65 watts and the ablation was maintained for 8 minutes. The probes were then removed. Attention was turned to the lesion in the posterior and medial aspect of segment 6. Utilizing CT imaging, a total of three 15 cm PR XT antennae were advanced into the mass. Ablation was then performed with all 3 antennae powered at 65 watts. The ablation was maintained for 10 minutes. Tract ablation was performed while removing the probes. Repeat CT imaging of the abdomen was then performed with intravenous contrast. The ablation zones appear to adequately cover the lesion sites. No evidence of active hemorrhage or other complication. FINDINGS: Cirrhotic liver with mild perihepatic ascites. IMPRESSION: Technically successful microwave ablation of hepatocellular carcinoma in hepatic segments 8 and 6. PLAN: Follow-up MR imaging of the abdomen in 3 months accompanied by clinic visit with Dr. Kathlene Cote. Signed, Criselda Peaches, MD, [facility] Radiology Electronically Signed   By: [name] M.D.   On: 09/06/2018 14:44        Scheduled Meds: . docusate sodium  100 mg Oral BID  . furosemide  40 mg Oral [id number]  . gabapentin  1,600 mg Oral TID  . insulin aspart  0-15 Units Subcutaneous TID AC & HS  . pantoprazole  40 mg Oral BID  . sodium chloride flush  10-40 mL Intracatheter Q12H  . spironolactone  50 mg Oral BID   Continuous  Infusions: . piperacillin-tazobactam (ZOSYN)  IV 3.375 g (09/08/18 0526)     LOS: 1 day    Time spent: 35 minutes    Irine Seal, MD [facility]  If 7PM-7AM, please contact night-coverage [email or web address] 09/08/2018, 11:45 AM

## 2018-09-08 NOTE — Progress Notes (Signed)
Discharge instructions discussed with patient, verbalized agreement and understanding

## 2018-09-08 NOTE — Discharge Instructions (Signed)
Radiofrequency/Thermal  Ablation of Liver Tumors, Care After This sheet gives you information about how to care for yourself after your procedure. Your health care provider may also give you more specific instructions. If you have problems or questions, contact your health care provider. What can I expect after the procedure? After your procedure, it is common to have pain and discomfort in the upper abdomen. You will be given pain medicines to control this. Follow these instructions at home: Incision care   Follow instructions from your health care provider about how to take care of your incision. Make sure you: ? Wash your hands with soap and water before you change your bandage (dressing). If soap and water are not available, use hand sanitizer. ? Change your dressing as told by your health care provider. ? Leave stitches (sutures), skin glue, or adhesive strips in place. These skin closures may need to stay in place for 2 weeks or longer. If adhesive strip edges start to loosen and curl up, you may trim the loose edges. Do not remove adhesive strips completely unless your health care provider tells you to do that.  Check your incision area every day for signs of infection. Check for: ? Redness, swelling, or pain. ? Fluid or blood. ? Warmth. ? Pus or a bad smell. Medicines  Take over-the-counter and prescription medicines only as told by your health care provider.  If you were prescribed an antibiotic medicine, use it as told by your health care provider. Do not stop using the antibiotic even if you start to feel better.  Do not drive or use heavy machinery while taking prescription pain medicine. Activity  Rest as often as needing during the first few days of recovery.  Do not lift anything that is heavier than 10 lbs (4.5 kg), or the limit that your health care provider tells you, until he or she says that it is safe.  Do not drive for 24 hours after the procedure if you were  given a medicine to help you relax (sedative). General instructions  Do not take baths, swim, or use a hot tub until your health care provider approves.  Follow any special diet instructions as directed by your health care provider.  To prevent or treat constipation while you are taking prescription pain medicine, your health care provider may recommend that you: ? Drink enough fluid to keep your urine clear or pale yellow. ? Take over-the-counter or prescription medicines. ? Eat foods that are high in fiber, such as fresh fruits and vegetables, whole grains, and beans. ? Limit foods that are high in fat and processed sugars, such as fried and sweet foods.  Do not use any products that contain nicotine or tobacco, such as cigarettes and e-cigarettes. If you need help quitting, ask your health care provider.  Keep all follow-up visits as told by your health care provider. This is important. Contact a health care provider if:  You cannot pass gas.  You are unable to have a bowel movement within 2 days.  You have a skin rash. Get help right away if:  You have a fever.  You have severe or lasting pain in your abdomen, shoulder, or back.  You have trouble swallowing or breathing.  You have severe weakness or dizziness.  You have chest pain or shortness of breath. Summary  After your procedure, it is common to have pain and discomfort in the upper abdomen. You will be given pain medicines to control this.  Do  not take baths, swim, or use a hot tub until your health care provider approves.  Do not drive or use heavy machinery while taking prescription pain medicine. This information is not intended to replace advice given to you by your health care provider. Make sure you discuss any questions you have with your health care provider. Document Released: 02/14/2013 Document Revised: 07/15/2016 Document Reviewed: 07/15/2016 Elsevier Interactive Patient Education  2019 [employer].

## 2018-09-08 NOTE — Plan of Care (Signed)
  Problem: Nutrition: Goal: Adequate nutrition will be maintained Outcome: Progressing   Problem: Pain Managment: Goal: General experience of comfort will improve Outcome: Progressing   Problem: Safety: Goal: Ability to remain free from injury will improve Outcome: Progressing   Problem: Clinical Measurements: Goal: Respiratory complications will improve Outcome: Progressing   Problem: Clinical Measurements: Goal: Will remain free from infection Outcome: Progressing

## 2018-09-08 NOTE — Progress Notes (Signed)
Patient assessed this AM.  Mental status much improved, appears to be at baseline. Remains stable from procedure 4/29. Requesting to go home.  Will touch base with hospitalist for possible discharge home today.   Brynda Greathouse, MS RD PA-C

## 2018-09-08 NOTE — Discharge Summary (Signed)
Patient ID: [name] MRN: [id number] DOB/AGE: 12-20-1955 63 y.o.  Admit date: 09/06/2018 Discharge date: 09/08/2018  Supervising Physician: [name]  Patient Status: [facility] - In-pt  Admission Diagnoses: Multifocal hepatocellular carcinoma  Discharge Diagnoses: Multifocal hepatocellular carcinoma, status post CT guided microwave ablation of tumors in segments 6 and 8 on 09/06/2018  Discharged Condition: good  Hospital Course: [name] is a 63 year old male with history of alcoholic cirrhosis, tobacco/substance abuse, hepatitis C, diabetes, anemia of chronic disease as well as multifocal hepatocellular carcinoma.  He underwent technically successful CT-guided microwave ablation of tumors in segment 6 and 8 on 09/06/2018 via general anesthesia.  He was subsequently admitted to the hospital afterwards for observation.  During first postop night patient became agitated and also complained of abdominal pain.  He was given IV Ativan as well as oxycodone.  He became more drowsy but there were no focal neurological signs.  TRH was consulted and recommended minimizing narcotic use with only as needed benzodiazepines.  On the second postop day patient was easily arousable and able to keep up with basic conversation.  His primary complaint was some diffuse itching and some mild right upper quadrant discomfort which was improved post procedure.  Above findings were discussed with Dr. Laurence [name] and [facility] service and patient was deemed stable for discharge at this time.  Both ammonia and lactic acid levels were normal. LFT's were stable.  He will remain on current home medications.  IR service will call patient in 2 weeks to check on status.  He will be scheduled for follow-up imaging of the liver in 3 months.  He was instructed to contact our service with any questions related to liver tumor ablation procedure.  Consults: TR[facility]  Significant Diagnostic Studies:  Results for orders placed or  performed during the hospital encounter of 09/06/18  Glucose, capillary  Result Value Ref Range   Glucose-Capillary 144 (H) 70 - 99 mg/dL   Comment 1 Notify RN    Comment 2 Document in Chart   Glucose, capillary  Result Value Ref Range   Glucose-Capillary 157 (H) 70 - 99 mg/dL  CBC  Result Value Ref Range   WBC 16.9 (H) 4.0 - 10.5 K/uL   RBC 2.44 (L) 4.22 - 5.81 MIL/uL   Hemoglobin 7.8 (L) 13.0 - 17.0 g/dL   HCT 24.0 (L) 39.0 - 52.0 %   MCV 98.4 80.0 - 100.0 fL   MCH 32.0 26.0 - 34.0 pg   MCHC 32.5 30.0 - 36.0 g/dL   RDW 14.2 11.5 - 15.5 %   Platelets 420 (H) 150 - 400 K/uL   nRBC 0.0 0.0 - 0.2 %  Comprehensive metabolic panel  Result Value Ref Range   Sodium 129 (L) 135 - 145 mmol/L   Potassium 5.2 (H) 3.5 - 5.1 mmol/L   Chloride 95 (L) 98 - 111 mmol/L   CO2 27 22 - 32 mmol/L   Glucose, Bld 189 (H) 70 - 99 mg/dL   BUN 17 8 - 23 mg/dL   Creatinine, Ser 0.89 0.61 - 1.24 mg/dL   Calcium 7.9 (L) 8.9 - 10.3 mg/dL   Total Protein 6.3 (L) 6.5 - 8.1 g/dL   Albumin 1.6 (L) 3.5 - 5.0 g/dL   AST 173 (H) 15 - 41 U/L   ALT 33 0 - 44 U/L   Alkaline Phosphatase 149 (H) 38 - 126 U/L   Total Bilirubin 0.6 0.3 - 1.2 mg/dL   GFR calc non Af Amer >60 >60  mL/min   GFR calc Af Amer >60 >60 mL/min   Anion gap 7 5 - 15  Glucose, capillary  Result Value Ref Range   Glucose-Capillary 160 (H) 70 - 99 mg/dL   Comment 1 Notify RN   Glucose, capillary  Result Value Ref Range   Glucose-Capillary 179 (H) 70 - 99 mg/dL  Ammonia  Result Value Ref Range   Ammonia 19 9 - 35 umol/L  Lactic acid, plasma  Result Value Ref Range   Lactic Acid, Venous 0.8 0.5 - 1.9 mmol/L  Lactic acid, plasma  Result Value Ref Range   Lactic Acid, Venous 1.2 0.5 - 1.9 mmol/L  TSH  Result Value Ref Range   TSH 1.894 0.350 - 4.500 uIU/mL  Glucose, capillary  Result Value Ref Range   Glucose-Capillary 131 (H) 70 - 99 mg/dL  Glucose, capillary  Result Value Ref Range   Glucose-Capillary 121 (H) 70 - 99 mg/dL   Glucose, capillary  Result Value Ref Range   Glucose-Capillary 159 (H) 70 - 99 mg/dL  Glucose, capillary  Result Value Ref Range   Glucose-Capillary 194 (H) 70 - 99 mg/dL     Treatments: Technically successful microwave ablation of hepatocellular carcinoma in hepatic segment 6 and 8 on 09/06/2018 via general anesthesia.  Discharge Exam: Blood pressure (!) 152/93, pulse 83, temperature 98.3 F (36.8 C), temperature source Oral, resp. rate 18, height 6' (1.829 m), weight 187 lb 9.8 oz (85.1 kg), SpO2 90 %. Awake, alert.  Chest with distant breath sounds bilaterally.  Heart with slightly tachycardic but regular rhythm.  Abdomen soft, positive bowel sounds, sl distended, puncture sites right upper quadrant abdomen slightly tender to palpation, no visible bleeding.  Bilateral pretibial edema noted.  Disposition: Discharge disposition: 01-Home or Self Care       Discharge Instructions    Call MD for:  difficulty breathing, headache or visual disturbances   Complete by:  As directed    Call MD for:  extreme fatigue   Complete by:  As directed    Call MD for:  hives   Complete by:  As directed    Call MD for:  persistant dizziness or light-headedness   Complete by:  As directed    Call MD for:  persistant nausea and vomiting   Complete by:  As directed    Call MD for:  redness, tenderness, or signs of infection (pain, swelling, redness, odor or green/yellow discharge around incision site)   Complete by:  As directed    Call MD for:  severe uncontrolled pain   Complete by:  As directed    Call MD for:  temperature >100.4   Complete by:  As directed    Diet - low sodium heart healthy   Complete by:  As directed    Discharge instructions   Complete by:  As directed    Minimize use of narcotic medications; stay hydrated   Driving Restrictions   Complete by:  As directed    No driving for next 24 hours or after narcotic use   Increase activity slowly   Complete by:  As directed     Lifting restrictions   Complete by:  As directed    No heavy lifting for next 3-4 days   May shower / Bathe   Complete by:  As directed    May walk up steps   Complete by:  As directed      Allergies as of 09/08/2018  Reactions   Benadryl [diphenhydramine Hcl] Other (See Comments)   "Causes nervousness"   Benadryl [diphenhydramine] Other (See Comments)   "makes my skin crawl"   Sulfa Antibiotics Other (See Comments)   Childhood allergy   Tegretol [carbamazepine] Other (See Comments)   "almost killed me"   Librium [chlordiazepoxide Hcl] Anxiety   Librium [chlordiazepoxide] Anxiety   Sulfa Antibiotics Rash      Medication List    TAKE these medications   albuterol 108 (90 Base) MCG/ACT inhaler Commonly known as:  VENTOLIN HFA Inhale 2 puffs into the lungs every 6 (six) hours as needed for wheezing or shortness of breath.   furosemide 40 MG tablet Commonly known as:  LASIX Take 1 tablet (40 mg total) by mouth every morning.   gabapentin 400 MG capsule Commonly known as:  NEURONTIN Take 1,600 mg by mouth 3 (three) times daily.   insulin glargine 100 UNIT/ML injection Commonly known as:  LANTUS Inject 0.3 mLs (30 Units total) into the skin daily. What changed:  when to take this   metFORMIN 1000 MG tablet Commonly known as:  GLUCOPHAGE Take 1 tablet (1,000 mg total) by mouth 2 (two) times daily with a meal.   ondansetron 4 MG disintegrating tablet Commonly known as:  Zofran ODT Take 1 tablet (4 mg total) by mouth every 8 (eight) hours as needed for nausea.   pantoprazole 40 MG tablet Commonly known as:  Protonix Take 1 tablet (40 mg total) by mouth daily.   promethazine 25 MG suppository Commonly known as:  PHENERGAN Place 1 suppository (25 mg total) rectally every 6 (six) hours as needed for nausea or vomiting.   spironolactone 50 MG tablet Commonly known as:  ALDACTONE Take 1 tablet (50 mg total) by mouth 2 (two) times daily.      Follow-up  Information    [name] Cadet, MD Follow up.   Specialties:  Interventional Radiology, Radiology Why:  radiology service will call you in 2 weeks to check on your status; your follow up imaging will be done in 3 months; call [phone] or [phone] with any questions regarding recent liver tumor ablation Contact information: [address] [place] [address] [phone]        Ladell Pier, MD Follow up.   Specialty:  Oncology Why:  follow up with Dr. [name] as scheduled Contact information: [place] [address] [phone]            Electronically Signed: D. Rowe Robert, PA-C 09/08/2018, 2:05 PM   I have spent Less Than 30 Minutes discharging [name].

## 2018-09-18 ENCOUNTER — Telehealth: Payer: Self-pay

## 2018-09-18 NOTE — Telephone Encounter (Signed)
Returned pt call. Left VM msg advising that I was returning call and to please call back in regards to msg.

## 2018-09-20 ENCOUNTER — Telehealth: Payer: Self-pay | Admitting: *Deleted

## 2018-09-20 NOTE — Telephone Encounter (Signed)
[name] to see how pt is doing post Microwave Ablation with Dr. Laurence Ferrari. [name]

## 2018-10-05 ENCOUNTER — Inpatient Hospital Stay: Payer: Medicaid Other | Attending: Oncology

## 2018-10-05 ENCOUNTER — Other Ambulatory Visit: Payer: Self-pay | Admitting: *Deleted

## 2018-10-05 ENCOUNTER — Other Ambulatory Visit: Payer: Self-pay

## 2018-10-05 ENCOUNTER — Telehealth: Payer: Self-pay | Admitting: *Deleted

## 2018-10-05 ENCOUNTER — Inpatient Hospital Stay: Payer: Medicaid Other | Admitting: Oncology

## 2018-10-05 DIAGNOSIS — C220 Liver cell carcinoma: Secondary | ICD-10-CM | POA: Diagnosis present

## 2018-10-05 LAB — CBC WITH DIFFERENTIAL (CANCER CENTER ONLY)
Abs Immature Granulocytes: 0.05 10*3/uL (ref 0.00–0.07)
Basophils Absolute: 0.1 10*3/uL (ref 0.0–0.1)
Basophils Relative: 1 %
Eosinophils Absolute: 0.8 10*3/uL — ABNORMAL HIGH (ref 0.0–0.5)
Eosinophils Relative: 6 %
HCT: 25.1 % — ABNORMAL LOW (ref 39.0–52.0)
Hemoglobin: 8.4 g/dL — ABNORMAL LOW (ref 13.0–17.0)
Immature Granulocytes: 0 %
Lymphocytes Relative: 15 %
Lymphs Abs: 2.3 10*3/uL (ref 0.7–4.0)
MCH: 30 pg (ref 26.0–34.0)
MCHC: 33.5 g/dL (ref 30.0–36.0)
MCV: 89.6 fL (ref 80.0–100.0)
Monocytes Absolute: 1.4 10*3/uL — ABNORMAL HIGH (ref 0.1–1.0)
Monocytes Relative: 9 %
Neutro Abs: 10.3 10*3/uL — ABNORMAL HIGH (ref 1.7–7.7)
Neutrophils Relative %: 69 %
Platelet Count: 292 10*3/uL (ref 150–400)
RBC: 2.8 MIL/uL — ABNORMAL LOW (ref 4.22–5.81)
RDW: 16.5 % — ABNORMAL HIGH (ref 11.5–15.5)
WBC Count: 14.9 10*3/uL — ABNORMAL HIGH (ref 4.0–10.5)
nRBC: 0 % (ref 0.0–0.2)

## 2018-10-05 LAB — SAMPLE TO BLOOD BANK

## 2018-10-05 MED ORDER — PROMETHAZINE HCL 25 MG PO TABS: 25.0000 mg | ORAL_TABLET | Freq: Four times a day (QID) | ORAL | 0 refills | Status: DC | PRN

## 2018-10-05 NOTE — Progress Notes (Signed)
Refilled Promethazine for him and made aware of his CBC results. Does not need transfusion. Will have him seen in 1 month with labs.

## 2018-10-05 NOTE — Telephone Encounter (Addendum)
Met patient in lobby today and informed him that Dr. [name] is not in the office and he will be rescheduled in the next month or sooner if labs warrant. Agreed to refill by the end of the day today. Also reports significant itching--suggested he try OTC Benadryl and apply non perfumed lotion to legs frequently. Patient called and left message to follow up on his promethazine refill to [name] at [place]. Noted his last phenergan was suppository--left VM for him to confirm he wants the suppository or tablet called in? Confirmed he wants tablets and not suppository. Provided him results of CBC--improved.

## 2018-10-05 NOTE — Telephone Encounter (Signed)
"  [place] [phone]).  We're at [facility] at [place].  He saw a nurse instead of the doctor today.  The nurse told him she ordered Phenergan and something for itching.  He has clawed himself up, can't sleep and needs something but pharmacy has not received orders.  Desperate because he doesn't drive, I can't bring him back to the pharmacy today and he needs nausea and itching medications.  He lives beyond the 5-mile maximum pharmacy delivery distance.  I'll bring him back there for a paper prescription he can take to a pharmacy."

## 2018-10-06 ENCOUNTER — Other Ambulatory Visit: Payer: Self-pay | Admitting: *Deleted

## 2018-10-06 ENCOUNTER — Telehealth: Payer: Self-pay | Admitting: Oncology

## 2018-10-06 DIAGNOSIS — C220 Liver cell carcinoma: Secondary | ICD-10-CM

## 2018-10-06 NOTE — Telephone Encounter (Signed)
Scheduled appt per 5/29 sch message.  Left a voice message about appt date and time.

## 2018-10-06 NOTE — Progress Notes (Signed)
MD made aware of CBC from 5/28 and complaint of itching. Wants to see him on 6/12 with labs. Scheduling message sent.

## 2018-10-16 ENCOUNTER — Telehealth: Payer: Self-pay | Admitting: *Deleted

## 2018-10-16 NOTE — Telephone Encounter (Addendum)
Verbal order received and read back from Dr. [name] for no orders for walker.  Order given to [facility] at this time to contact PCP for walker orders.   Arbie Cookey denies Faustino Congress having a PCP and asks "Will I be allowed with him during the visit.  He won't remember anything about the visit." No visitor's allowed with [id number] visiting policy.  Provider may call family to include with visit.  "Yes, I would like a call after he sees him to know what happens with Friday's visit." Note requesting call added to appointment.  Provided [facility] locations on [facility] and [place].  Some act as PCP or may provide access to [facility] or more.  Physical Therapy consult may be ordered by PCP or with a hospitalization.

## 2018-10-16 NOTE — Telephone Encounter (Addendum)
"  I need to speak to a triage nurse or Dr. Gearldine Shown nurse or PA.  My name is [name] I'm calling for [name].  He is in bad shape.  His mind's not working correctly and he's falling.  He fell in the parking lot, he busted the back of his head.  Anyway I am his only support person and I need to find out what to do.  If I need to just go ahead and have him taken to the hospital ER, my number is [phone], please call me."   KOBE OFALLON advised to go to ED for evaluation.  Declined.  [name] advise to call 911 with report she is "unable to safely get him in her car."  Request return call from provider.  Aware of 10-16-2018 schedule.  "His legs aren't working right, every two steps he takes he falls.  Lives [place] second floor of a Hotel.    Hit the back of his head Saturday morning after I took him to the store.  Blood on the back of his head but he would not let me call 911.  Today it feels scabbed over, no bump, bruise or pain.    He walked to the store on his own yesterday, fell hitting left hip.  Today he says his hip does not hurt.    Takes fluid pill and pees a lot but I don't know about his bowels.  No alcohol, he quit drinking three years ago.    He won't go to the ED.  Says he'll be fine if Dr. [name] will order a walker.    He received disability and Medicaid last week.  He also is still itching; Benadryl does not help."  "I've been itching since before my surgery."   Located patient confirmed [name] on [facility].  No date of birth provided with voicemail to identify patient with initial call.

## 2018-10-20 ENCOUNTER — Inpatient Hospital Stay (HOSPITAL_BASED_OUTPATIENT_CLINIC_OR_DEPARTMENT_OTHER): Payer: Medicaid Other | Admitting: Oncology

## 2018-10-20 ENCOUNTER — Encounter: Payer: Self-pay | Admitting: *Deleted

## 2018-10-20 ENCOUNTER — Telehealth: Payer: Self-pay

## 2018-10-20 ENCOUNTER — Telehealth: Payer: Self-pay | Admitting: *Deleted

## 2018-10-20 ENCOUNTER — Other Ambulatory Visit: Payer: Self-pay

## 2018-10-20 ENCOUNTER — Inpatient Hospital Stay: Payer: Medicaid Other | Attending: Oncology

## 2018-10-20 ENCOUNTER — Other Ambulatory Visit: Payer: Self-pay | Admitting: *Deleted

## 2018-10-20 VITALS — BP 138/72 | HR 88 | Temp 98.5°F | Resp 18 | Ht 72.0 in | Wt 151.3 lb

## 2018-10-20 DIAGNOSIS — K7460 Unspecified cirrhosis of liver: Secondary | ICD-10-CM

## 2018-10-20 DIAGNOSIS — B1920 Unspecified viral hepatitis C without hepatic coma: Secondary | ICD-10-CM | POA: Diagnosis not present

## 2018-10-20 DIAGNOSIS — E1142 Type 2 diabetes mellitus with diabetic polyneuropathy: Secondary | ICD-10-CM

## 2018-10-20 DIAGNOSIS — R918 Other nonspecific abnormal finding of lung field: Secondary | ICD-10-CM | POA: Diagnosis not present

## 2018-10-20 DIAGNOSIS — C220 Liver cell carcinoma: Secondary | ICD-10-CM | POA: Insufficient documentation

## 2018-10-20 DIAGNOSIS — E1121 Type 2 diabetes mellitus with diabetic nephropathy: Secondary | ICD-10-CM

## 2018-10-20 LAB — CMP (CANCER CENTER ONLY)
ALT: 12 U/L (ref 0–44)
AST: 32 U/L (ref 15–41)
Albumin: 1.6 g/dL — ABNORMAL LOW (ref 3.5–5.0)
Alkaline Phosphatase: 148 U/L — ABNORMAL HIGH (ref 38–126)
Anion gap: 8 (ref 5–15)
BUN: 13 mg/dL (ref 8–23)
CO2: 34 mmol/L — ABNORMAL HIGH (ref 22–32)
Calcium: 7 mg/dL — ABNORMAL LOW (ref 8.9–10.3)
Chloride: 90 mmol/L — ABNORMAL LOW (ref 98–111)
Creatinine: 0.99 mg/dL (ref 0.61–1.24)
GFR, Est AFR Am: >60 mL/min (ref >60)
GFR, Estimated: >60 mL/min (ref >60)
Glucose, Bld: 382 mg/dL — ABNORMAL HIGH (ref 70–99)
Potassium: 3 mmol/L — CL (ref 3.5–5.1)
Sodium: 132 mmol/L — ABNORMAL LOW (ref 135–145)
Total Bilirubin: 0.9 mg/dL (ref 0.3–1.2)
Total Protein: 6.7 g/dL (ref 6.5–8.1)

## 2018-10-20 LAB — CBC WITH DIFFERENTIAL (CANCER CENTER ONLY)
Abs Immature Granulocytes: 0.05 10*3/uL (ref 0.00–0.07)
Basophils Absolute: 0.1 10*3/uL (ref 0.0–0.1)
Basophils Relative: 1 %
Eosinophils Absolute: 0.8 10*3/uL — ABNORMAL HIGH (ref 0.0–0.5)
Eosinophils Relative: 7 %
HCT: 24.1 % — ABNORMAL LOW (ref 39.0–52.0)
Hemoglobin: 7.8 g/dL — ABNORMAL LOW (ref 13.0–17.0)
Immature Granulocytes: 0 %
Lymphocytes Relative: 20 %
Lymphs Abs: 2.3 10*3/uL (ref 0.7–4.0)
MCH: 30 pg (ref 26.0–34.0)
MCHC: 32.4 g/dL (ref 30.0–36.0)
MCV: 92.7 fL (ref 80.0–100.0)
Monocytes Absolute: 1 10*3/uL (ref 0.1–1.0)
Monocytes Relative: 9 %
Neutro Abs: 7 10*3/uL (ref 1.7–7.7)
Neutrophils Relative %: 63 %
Platelet Count: 220 10*3/uL (ref 150–400)
RBC: 2.6 MIL/uL — ABNORMAL LOW (ref 4.22–5.81)
RDW: 18 % — ABNORMAL HIGH (ref 11.5–15.5)
WBC Count: 11.2 10*3/uL — ABNORMAL HIGH (ref 4.0–10.5)
nRBC: 0 % (ref 0.0–0.2)

## 2018-10-20 MED ORDER — GABAPENTIN 400 MG PO CAPS: 1600.0000 mg | ORAL_CAPSULE | Freq: Three times a day (TID) | ORAL | 0 refills | Status: DC

## 2018-10-20 MED ORDER — PROMETHAZINE HCL 12.5 MG PO TABS: 12.5000 mg | ORAL_TABLET | Freq: Three times a day (TID) | ORAL | 0 refills | Status: DC | PRN

## 2018-10-20 MED ORDER — POTASSIUM CHLORIDE CRYS ER 20 MEQ PO TBCR: 20.0000 meq | EXTENDED_RELEASE_TABLET | Freq: Every day | ORAL | 1 refills | Status: DC

## 2018-10-20 MED ORDER — ONDANSETRON HCL 8 MG PO TABS: 8.0000 mg | ORAL_TABLET | Freq: Three times a day (TID) | ORAL | 0 refills | Status: DC | PRN

## 2018-10-20 NOTE — Progress Notes (Signed)
Nutrition  Provided one complimentary case of ensure enlive to patient today.    [name] [name] Resides, [place], [place] Registered Dietitian [phone] (pager)

## 2018-10-20 NOTE — Telephone Encounter (Signed)
-----  Message from Mauri Pole, MD sent at 10/20/2018  3:25 PM EDT ----- Yes, we will try to bring him in for a visit soon.  Beth, if I have no available appointments, can you please get him in with Amy or Nevin Bloodgood soon. Thanks ----- Message ----- From: Ladell Pier, MD Sent: 10/20/2018   3:09 PM EDT To: Mauri Pole, MD  Patient with cirrhosis and hepatocellular carcinoma.  I think you have seen him in the pastHe has multiple comorbid conditions.  He currently does not have a primary provider.  Can you see him within the next 1-2 weeks for help with managing his cirrhosis and volume status  We are trying to get him established with a primary care provider for management of diabetes  He has severe anemia secondary to chronic disease and potentially GI bleeding related to varices, though he denies gross bleeding  Thanks,  Leroy Sea

## 2018-10-20 NOTE — Telephone Encounter (Signed)
Called to the patient's number. No answer. Left a message offering the next available appointment which is 10/26/18 at 3:00 pm with Nicoletta Ba, PAC. Request he call us back to confirm or reschedule.

## 2018-10-20 NOTE — Progress Notes (Signed)
Patient was provided a case of Ensure per dietician and script for rolling walker. Referral for PT/OT ordered and [facility] was called regarding referral.

## 2018-10-20 NOTE — Telephone Encounter (Signed)
Called x-wife, Arbie Cookey, who is patient's primary source to let her know that I would be scheduling an appointment with a Primary care physician. She requested an afternoon appointment. Will touch base with her after apt is scheduled.

## 2018-10-20 NOTE — Patient Instructions (Signed)
Medications (sent to pharmacy):  Zofran 8 mg every 8 hours as needed for nausea  Phenergan 12.5 mg every 6 hours as needed for nausea (causes drowsiness)  Potassium chloride 20 meq by mouth daily  Will make a physical therapy/occupational therapy referral to help with strength, gait, balance Rolling walker will help-see script Call your Medicaid Case Worker to assist with finding a primary care doctor  Dr. [name] will make a referral to gastroenterologist to manage your liver issues

## 2018-10-20 NOTE — Progress Notes (Signed)
Called support person, Arbie Cookey, to let her know that referral to Primary Care at [facility] had been made. I gave her their contact information so she can f/u on Monday. She would like to coordinate his apt with her schedule.

## 2018-10-20 NOTE — Progress Notes (Signed)
Per [facility]: unable to admit for PT/OT due to staffing and payor source. Per [facility]: Not contracted with Medicaid for PT/OT Per [facility]: Unable to accept patient due to staffing. Informed his ex-wife, Arbie Cookey of above and requested she talk with patient over the weekend to see if he is wiling to go to outpatient [facility] if he has transportation. They will discuss and call back with decision on Monday.  Faxed new script to [facility] for rolling walker with seat as requested.

## 2018-10-20 NOTE — Telephone Encounter (Signed)
Ok thanks

## 2018-10-20 NOTE — Telephone Encounter (Signed)
Received call report from [facility].  "Today's K+ = 3.0."  Secure Chat sent with results.  Scheduled provider F/U today.

## 2018-10-20 NOTE — Progress Notes (Signed)
[place] OFFICE PROGRESS NOTE   Diagnosis: Hepatocellular carcinoma  INTERVAL HISTORY:   Mr. Halley underwent ablation of 2 liver lesions on 09/06/2018.  He has multiple complaints today.  He complains of pruritus, nausea, and he has had multiple falls over the past few weeks.  He feels that his balance is off.  He denies alcohol use. He is taking Lasix and Aldactone.  He reports chronic use of gabapentin for painful peripheral neuropathy. He is currently living alone in a long-term motel.  He does not have a primary physician or GI follow-up.  Mr. Behar denies bleeding.  Objective:  Vital signs in last 24 hours:  Blood pressure 138/72, pulse 88, temperature 98.5 F (36.9 C), temperature source Oral, resp. rate 18, height 6' (1.829 m), weight 151 lb 4.8 oz (68.6 kg), SpO2 97 %.     Resp: Lungs clear bilaterally, no respiratory distress Cardio: Regular rate and rhythm GI: No hepatosplenomegaly, no apparent ascites Vascular: No leg edema Neuro: Alert and oriented, he ambulates with a slightly unsteady gait.  Motor exam appears intact in the upper lower extremities bilaterally.  Finger-to-nose testing is intact Skin: Multiple excoriations and areas of scratching over the back and legs    Lab Results:  Lab Results  Component Value Date   WBC 11.2 (H) 10/20/2018   HGB 7.8 (L) 10/20/2018   HCT 24.1 (L) 10/20/2018   MCV 92.7 10/20/2018   PLT 220 10/20/2018   NEUTROABS 7.0 10/20/2018    CMP  Lab Results  Component Value Date   NA 132 (L) 10/20/2018   K 3.0 (LL) 10/20/2018   CL 90 (L) 10/20/2018   CO2 34 (H) 10/20/2018   GLUCOSE 382 (H) 10/20/2018   BUN 13 10/20/2018   CREATININE 0.99 10/20/2018   CALCIUM 7.0 (L) 10/20/2018   PROT 6.7 10/20/2018   ALBUMIN 1.6 (L) 10/20/2018   AST 32 10/20/2018   ALT 12 10/20/2018   ALKPHOS 148 (H) 10/20/2018   BILITOT 0.9 10/20/2018   GFRNONAA >60 10/20/2018   GFRAA >60 10/20/2018      Medications: I  have reviewed the patient's current medications.   Assessment/Plan: 1. Hepatocellular carcinoma ? CT abdomen/pelvis 05/31/2018- cirrhosis, segment 8 and in segment 6 hyperenhancing lesion suspicious for multifocal hepatocellular carcinoma, ascites, mild upper retroperitoneal adenopathy-reactive versus metastatic, 4 mm right lower lobe nodule ? CT chest 05/31/2018- 4 mm right lower lobe nodule ? MRI liver 05/31/2018-cirrhosis, 2 right hepatic lesions definitive for hepatocellular carcinoma, third hypervascular liver dome lesion likely hepatocellular carcinoma, mild porta hepatis and retroperitoneal lymphadenopathy suspicious for metastatic disease, ascites ? Normal AFP ? CT abdomen/pelvis 08/04/2018-cirrhosis with portal hypertension mild progression of upper abdominal adenopathy, liver lesions not well seen secondary to phase of contrast ? Microwave ablation of segment 6 and segment 8 lesions on 09/06/2018 2. 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 Hepatitis C treated with Epclusa beginning September 2019, HCV not detected 06/01/2018 4. Anemia secondary to chronic disease and potentially GI bleeding 5. Diabetes 6. Diabetic neuropathy 7. Anasarca secondary to cirrhosis and hypoalbuminemia-improved 8. Right lung nodule noted on chest CT and CT abdomen/pelvis 05/31/2018 9. Nausea-etiology unclear    Disposition: Mr. Davidow has a history of multifocal hepatocellular carcinoma in the setting of advanced cirrhosis.  He underwent ablation of 2 hepatic lesions on 09/06/2018.  He now has failure to thrive.  He has multiple comorbid conditions including diabetes, malnutrition, and severe anemia.  The anemia is likely related to chronic disease  and potentially GI blood loss related to portal hypertension.  The weight loss is secondary to fluid loss with diuretics, anorexia, and poorly managed diabetes.  He has multiple recent falls.  This is likely secondary to deconditioning and neuropathy.   We will arrange for a home PT and OT evaluation.  We prescribed a walker today.  Mr. Pursell will begin a potassium supplement.  We added ondansetron to use as needed for nausea.  I will request a GI evaluation for management of cirrhosis.  40 minutes were spent with the patient today.  The majority of the time was used for counseling and coordination of care.  He will return for an office and lab visit in 2 weeks.  Betsy Coder, MD  10/20/2018  2:59 PM

## 2018-10-23 ENCOUNTER — Telehealth: Payer: Self-pay | Admitting: Oncology

## 2018-10-23 NOTE — Telephone Encounter (Signed)
Scheduled appt per 6/12 los. Spoke with patient and patient aware of appt date and time.

## 2018-10-23 NOTE — Telephone Encounter (Signed)
Left a message on the voicemail of his ex-wife Ms.McCraw.  Asking for a call back to accept or reschedule the appointment made for 10/26/18 with Nicoletta Ba, PA-C

## 2018-10-26 ENCOUNTER — Ambulatory Visit (INDEPENDENT_AMBULATORY_CARE_PROVIDER_SITE_OTHER): Payer: Medicaid Other | Admitting: Physician Assistant

## 2018-10-26 ENCOUNTER — Encounter: Payer: Self-pay | Admitting: Physician Assistant

## 2018-10-26 ENCOUNTER — Other Ambulatory Visit (INDEPENDENT_AMBULATORY_CARE_PROVIDER_SITE_OTHER): Payer: Medicaid Other

## 2018-10-26 ENCOUNTER — Telehealth: Payer: Self-pay

## 2018-10-26 VITALS — BP 90/56 | HR 95 | Temp 97.9°F | Ht 72.0 in | Wt 151.0 lb

## 2018-10-26 DIAGNOSIS — R110 Nausea: Secondary | ICD-10-CM

## 2018-10-26 DIAGNOSIS — C220 Liver cell carcinoma: Secondary | ICD-10-CM

## 2018-10-26 DIAGNOSIS — R634 Abnormal weight loss: Secondary | ICD-10-CM | POA: Diagnosis not present

## 2018-10-26 DIAGNOSIS — R627 Adult failure to thrive: Secondary | ICD-10-CM

## 2018-10-26 DIAGNOSIS — R410 Disorientation, unspecified: Secondary | ICD-10-CM

## 2018-10-26 DIAGNOSIS — K7290 Hepatic failure, unspecified without coma: Secondary | ICD-10-CM | POA: Diagnosis not present

## 2018-10-26 DIAGNOSIS — K7460 Unspecified cirrhosis of liver: Secondary | ICD-10-CM

## 2018-10-26 LAB — PROTIME-INR
INR: 1.3 ratio — ABNORMAL HIGH (ref 0.8–1.0)
Prothrombin Time: 15.5 s — ABNORMAL HIGH (ref 9.6–13.1)

## 2018-10-26 LAB — BASIC METABOLIC PANEL
BUN: 17 mg/dL (ref 6–23)
CO2: 38 mEq/L — ABNORMAL HIGH (ref 19–32)
Calcium: 7.8 mg/dL — ABNORMAL LOW (ref 8.4–10.5)
Chloride: 88 mEq/L — ABNORMAL LOW (ref 96–112)
Creatinine, Ser: 0.93 mg/dL (ref 0.40–1.50)
GFR: 82.34 mL/min (ref >60.00)
Glucose, Bld: 340 mg/dL — ABNORMAL HIGH (ref 70–99)
Potassium: 4.1 mEq/L (ref 3.5–5.1)
Sodium: 127 mEq/L — ABNORMAL LOW (ref 135–145)

## 2018-10-26 LAB — AMMONIA: Ammonia: 34 umol/L (ref 11–35)

## 2018-10-26 MED ORDER — PROMETHAZINE HCL 12.5 MG PO TABS: 12.5000 mg | ORAL_TABLET | Freq: Four times a day (QID) | ORAL | 0 refills | Status: DC | PRN

## 2018-10-26 MED ORDER — PANTOPRAZOLE SODIUM 40 MG PO TBEC: 40.0000 mg | DELAYED_RELEASE_TABLET | ORAL | 2 refills | Status: DC

## 2018-10-26 MED ORDER — LACTULOSE 20 GM/30ML PO SOLN: ORAL | 6 refills | Status: DC

## 2018-10-26 MED ORDER — FUROSEMIDE 20 MG PO TABS: 20.0000 mg | ORAL_TABLET | ORAL | 1 refills | Status: DC

## 2018-10-26 MED ORDER — NONFORMULARY OR COMPOUNDED ITEM: 0 refills | Status: DC

## 2018-10-26 MED ORDER — SPIRONOLACTONE 50 MG PO TABS: 50.0000 mg | ORAL_TABLET | Freq: Once | ORAL | 1 refills | Status: DC
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
## 2018-10-26 NOTE — Telephone Encounter (Signed)
Will question patient in the office

## 2018-10-26 NOTE — Telephone Encounter (Signed)
covid screening

## 2018-10-26 NOTE — Patient Instructions (Signed)
If you are age 62 or older, your body mass index should be between 23-30. Your Body mass index is 20.48 kg/m. If this is out of the aforementioned range listed, please consider follow up with your Primary Care Provider.  If you are age 18 or younger, your body mass index should be between 19-25. Your Body mass index is 20.48 kg/m. If this is out of the aformentioned range listed, please consider follow up with your Primary Care Provider.   Your provider has requested that you go to the basement level for lab work before leaving today. Press "B" on the elevator. The lab is located at the first door on the left as you exit the elevator.  We have sent the following medications to your pharmacy for you to pick up at your convenience: Lactulose Phenergan Protonix Aldactone Lasix  You have been scheduled for an MRI at [facility] on 10/31/18. Your appointment time is 3 pm. Please arrive 30 minutes prior to your appointment time for registration purposes. Please make certain not to have anything to eat or drink 6 hours prior to your test. In addition, if you have any metal in your body, have a pacemaker or defibrillator, please be sure to let your ordering physician know. This test typically takes 45 minutes to 1 hour to complete. Should you need to reschedule, please call [phone] to do so.  We are sending an order to [facility] for bedside commode (over commode chair)  Thank you for choosing me and [facility].   Amy Esterwood, PA-C

## 2018-10-27 LAB — AFP TUMOR MARKER: AFP-Tumor Marker: 3.5 ng/mL (ref <6.1)

## 2018-10-31 ENCOUNTER — Ambulatory Visit (HOSPITAL_COMMUNITY)
Admission: RE | Admit: 2018-10-31 | Discharge: 2018-10-31 | Disposition: A | Discharge status: Home / Self Care | Payer: Medicaid Other | Source: Ambulatory Visit | Attending: Physician Assistant | Admitting: Physician Assistant

## 2018-10-31 ENCOUNTER — Telehealth: Payer: Self-pay

## 2018-10-31 NOTE — Progress Notes (Signed)
Patient schedule for MRI abd wo/w today at 3pm, pt was a no call no show for his appt.   bj

## 2018-10-31 NOTE — Telephone Encounter (Signed)
Spoke with pharmacy who said they don't carry Lactulose the way we ordered it but they do have it in large bottles, the exact same concentration as the way it was written just in a different container.  Patient can still take 30 ccs twice a day

## 2018-11-02 NOTE — Telephone Encounter (Signed)
Patient was seen.

## 2018-11-03 ENCOUNTER — Inpatient Hospital Stay: Payer: Medicaid Other

## 2018-11-03 ENCOUNTER — Inpatient Hospital Stay: Payer: Medicaid Other | Admitting: Nurse Practitioner

## 2018-11-04 ENCOUNTER — Inpatient Hospital Stay (HOSPITAL_COMMUNITY)
Admission: EM | Admit: 2018-11-04 | Discharge: 2018-11-11 | DRG: 441 | Disposition: A | Discharge status: Hospice | Payer: Medicaid Other | Attending: Internal Medicine | Admitting: Internal Medicine

## 2018-11-04 ENCOUNTER — Encounter (HOSPITAL_COMMUNITY): Payer: Self-pay | Admitting: Emergency Medicine

## 2018-11-04 ENCOUNTER — Emergency Department (HOSPITAL_COMMUNITY): Payer: Medicaid Other

## 2018-11-04 DIAGNOSIS — Z794 Long term (current) use of insulin: Secondary | ICD-10-CM

## 2018-11-04 DIAGNOSIS — C7931 Secondary malignant neoplasm of brain: Secondary | ICD-10-CM | POA: Diagnosis present

## 2018-11-04 DIAGNOSIS — C220 Liver cell carcinoma: Secondary | ICD-10-CM | POA: Diagnosis present

## 2018-11-04 DIAGNOSIS — E871 Hypo-osmolality and hyponatremia: Secondary | ICD-10-CM | POA: Diagnosis present

## 2018-11-04 DIAGNOSIS — D649 Anemia, unspecified: Secondary | ICD-10-CM | POA: Diagnosis present

## 2018-11-04 DIAGNOSIS — E1140 Type 2 diabetes mellitus with diabetic neuropathy, unspecified: Secondary | ICD-10-CM | POA: Diagnosis not present

## 2018-11-04 DIAGNOSIS — R296 Repeated falls: Secondary | ICD-10-CM | POA: Diagnosis present

## 2018-11-04 DIAGNOSIS — E43 Unspecified severe protein-calorie malnutrition: Secondary | ICD-10-CM | POA: Diagnosis present

## 2018-11-04 DIAGNOSIS — F419 Anxiety disorder, unspecified: Secondary | ICD-10-CM | POA: Diagnosis present

## 2018-11-04 DIAGNOSIS — D689 Coagulation defect, unspecified: Secondary | ICD-10-CM | POA: Diagnosis present

## 2018-11-04 DIAGNOSIS — Y95 Nosocomial condition: Secondary | ICD-10-CM | POA: Diagnosis present

## 2018-11-04 DIAGNOSIS — E119 Type 2 diabetes mellitus without complications: Secondary | ICD-10-CM

## 2018-11-04 DIAGNOSIS — E1100 Type 2 diabetes mellitus with hyperosmolarity without nonketotic hyperglycemic-hyperosmolar coma (NKHHC): Secondary | ICD-10-CM | POA: Diagnosis present

## 2018-11-04 DIAGNOSIS — Z515 Encounter for palliative care: Secondary | ICD-10-CM | POA: Diagnosis not present

## 2018-11-04 DIAGNOSIS — Z781 Physical restraint status: Secondary | ICD-10-CM

## 2018-11-04 DIAGNOSIS — Z66 Do not resuscitate: Secondary | ICD-10-CM | POA: Diagnosis not present

## 2018-11-04 DIAGNOSIS — F1290 Cannabis use, unspecified, uncomplicated: Secondary | ICD-10-CM | POA: Diagnosis present

## 2018-11-04 DIAGNOSIS — R4182 Altered mental status, unspecified: Secondary | ICD-10-CM

## 2018-11-04 DIAGNOSIS — Z681 Body mass index (BMI) 19 or less, adult: Secondary | ICD-10-CM

## 2018-11-04 DIAGNOSIS — Z9181 History of falling: Secondary | ICD-10-CM

## 2018-11-04 DIAGNOSIS — K7682 Hepatic encephalopathy: Secondary | ICD-10-CM

## 2018-11-04 DIAGNOSIS — R188 Other ascites: Secondary | ICD-10-CM

## 2018-11-04 DIAGNOSIS — K7200 Acute and subacute hepatic failure without coma: Principal | ICD-10-CM | POA: Diagnosis present

## 2018-11-04 DIAGNOSIS — K7031 Alcoholic cirrhosis of liver with ascites: Secondary | ICD-10-CM | POA: Diagnosis present

## 2018-11-04 DIAGNOSIS — R627 Adult failure to thrive: Secondary | ICD-10-CM | POA: Diagnosis present

## 2018-11-04 DIAGNOSIS — R401 Stupor: Secondary | ICD-10-CM

## 2018-11-04 DIAGNOSIS — F1020 Alcohol dependence, uncomplicated: Secondary | ICD-10-CM | POA: Diagnosis present

## 2018-11-04 DIAGNOSIS — R3129 Other microscopic hematuria: Secondary | ICD-10-CM | POA: Diagnosis present

## 2018-11-04 DIAGNOSIS — K7290 Hepatic failure, unspecified without coma: Secondary | ICD-10-CM

## 2018-11-04 DIAGNOSIS — Z23 Encounter for immunization: Secondary | ICD-10-CM

## 2018-11-04 DIAGNOSIS — F1021 Alcohol dependence, in remission: Secondary | ICD-10-CM | POA: Diagnosis present

## 2018-11-04 DIAGNOSIS — B182 Chronic viral hepatitis C: Secondary | ICD-10-CM | POA: Diagnosis present

## 2018-11-04 DIAGNOSIS — E512 Wernicke's encephalopathy: Secondary | ICD-10-CM | POA: Diagnosis present

## 2018-11-04 DIAGNOSIS — J189 Pneumonia, unspecified organism: Secondary | ICD-10-CM | POA: Diagnosis present

## 2018-11-04 DIAGNOSIS — Z1159 Encounter for screening for other viral diseases: Secondary | ICD-10-CM

## 2018-11-04 LAB — CBC WITH DIFFERENTIAL/PLATELET
Abs Immature Granulocytes: 0.06 10*3/uL (ref 0.00–0.07)
Basophils Absolute: 0 10*3/uL (ref 0.0–0.1)
Basophils Relative: 0 %
Eosinophils Absolute: 0.2 10*3/uL (ref 0.0–0.5)
Eosinophils Relative: 2 %
HCT: 19.8 % — ABNORMAL LOW (ref 39.0–52.0)
Hemoglobin: 6.4 g/dL — CL (ref 13.0–17.0)
Immature Granulocytes: 1 %
Lymphocytes Relative: 15 %
Lymphs Abs: 1.6 10*3/uL (ref 0.7–4.0)
MCH: 30.9 pg (ref 26.0–34.0)
MCHC: 32.3 g/dL (ref 30.0–36.0)
MCV: 95.7 fL (ref 80.0–100.0)
Monocytes Absolute: 1.3 10*3/uL — ABNORMAL HIGH (ref 0.1–1.0)
Monocytes Relative: 12 %
Neutro Abs: 7.6 10*3/uL (ref 1.7–7.7)
Neutrophils Relative %: 70 %
Platelets: 199 10*3/uL (ref 150–400)
RBC: 2.07 MIL/uL — ABNORMAL LOW (ref 4.22–5.81)
RDW: 17.9 % — ABNORMAL HIGH (ref 11.5–15.5)
WBC: 10.8 10*3/uL — ABNORMAL HIGH (ref 4.0–10.5)
nRBC: 0 % (ref 0.0–0.2)

## 2018-11-04 LAB — COMPREHENSIVE METABOLIC PANEL
ALT: 14 U/L (ref 0–44)
AST: 31 U/L (ref 15–41)
Albumin: 1.6 g/dL — ABNORMAL LOW (ref 3.5–5.0)
Alkaline Phosphatase: 144 U/L — ABNORMAL HIGH (ref 38–126)
Anion gap: 6 (ref 5–15)
BUN: 25 mg/dL — ABNORMAL HIGH (ref 8–23)
CO2: 33 mmol/L — ABNORMAL HIGH (ref 22–32)
Calcium: 7.8 mg/dL — ABNORMAL LOW (ref 8.9–10.3)
Chloride: 88 mmol/L — ABNORMAL LOW (ref 98–111)
Creatinine, Ser: 0.95 mg/dL (ref 0.61–1.24)
GFR calc Af Amer: >60 mL/min (ref >60)
GFR calc non Af Amer: >60 mL/min (ref >60)
Glucose, Bld: 533 mg/dL (ref 70–99)
Potassium: 4.4 mmol/L (ref 3.5–5.1)
Sodium: 127 mmol/L — ABNORMAL LOW (ref 135–145)
Total Bilirubin: 1 mg/dL (ref 0.3–1.2)
Total Protein: 6 g/dL — ABNORMAL LOW (ref 6.5–8.1)

## 2018-11-04 LAB — BLOOD GAS, VENOUS
Acid-Base Excess: 10.7 mmol/L — ABNORMAL HIGH (ref 0.0–2.0)
Bicarbonate: 36.1 mmol/L — ABNORMAL HIGH (ref 20.0–28.0)
FIO2: 21
O2 Saturation: 61.7 %
Patient temperature: 98.6
pCO2, Ven: 53.7 mmHg (ref 44.0–60.0)
pH, Ven: 7.442 — ABNORMAL HIGH (ref 7.250–7.430)
pO2, Ven: 35.8 mmHg (ref 32.0–45.0)

## 2018-11-04 LAB — URINALYSIS, ROUTINE W REFLEX MICROSCOPIC
Bacteria, UA: NONE SEEN
Bilirubin Urine: NEGATIVE
Glucose, UA: 150 mg/dL — AB
Ketones, ur: NEGATIVE mg/dL
Leukocytes,Ua: NEGATIVE
Nitrite: NEGATIVE
Protein, ur: 100 mg/dL — AB
Specific Gravity, Urine: 1.025 (ref 1.005–1.030)
pH: 6 (ref 5.0–8.0)

## 2018-11-04 LAB — POC OCCULT BLOOD, ED: Fecal Occult Bld: POSITIVE — AB

## 2018-11-04 LAB — LIPASE, BLOOD: Lipase: 21 U/L (ref 11–51)

## 2018-11-04 LAB — PREPARE RBC (CROSSMATCH)

## 2018-11-04 LAB — CBG MONITORING, ED: Glucose-Capillary: 498 mg/dL — ABNORMAL HIGH (ref 70–99)

## 2018-11-04 MED ORDER — SODIUM CHLORIDE 0.9% FLUSH: 3.0000 mL | INTRAVENOUS | Status: DC | PRN

## 2018-11-04 MED ORDER — SODIUM CHLORIDE 0.9 % IV SOLN: 250.0000 mL | INTRAVENOUS | Status: DC | PRN

## 2018-11-04 MED ORDER — INSULIN ASPART 100 UNIT/ML ~~LOC~~ SOLN
0.0000 [IU] | SUBCUTANEOUS | Status: DC
  Administered 2018-11-05: 2 [IU] via SUBCUTANEOUS
  Administered 2018-11-05: 7 [IU] via SUBCUTANEOUS
  Administered 2018-11-05: 1 [IU] via SUBCUTANEOUS
  Administered 2018-11-05: 9 [IU] via SUBCUTANEOUS
  Administered 2018-11-05: 5 [IU] via SUBCUTANEOUS
  Administered 2018-11-05 – 2018-11-06 (×3): 1 [IU] via SUBCUTANEOUS
  Administered 2018-11-06: 2 [IU] via SUBCUTANEOUS
  Administered 2018-11-06 – 2018-11-07 (×4): 1 [IU] via SUBCUTANEOUS
  Administered 2018-11-08: 2 [IU] via SUBCUTANEOUS
  Administered 2018-11-08 – 2018-11-09 (×4): 1 [IU] via SUBCUTANEOUS
  Filled 2018-11-04: qty 0.09

## 2018-11-04 MED ORDER — SODIUM CHLORIDE 0.9 % IV SOLN
10.0000 mL/h | Freq: Once | INTRAVENOUS | Status: AC
  Administered 2018-11-05: 10 mL/h via INTRAVENOUS

## 2018-11-04 MED ORDER — INSULIN ASPART 100 UNIT/ML ~~LOC~~ SOLN
6.0000 [IU] | Freq: Once | SUBCUTANEOUS | Status: DC
  Filled 2018-11-04: qty 0.06

## 2018-11-04 MED ORDER — SODIUM CHLORIDE 0.9% FLUSH
3.0000 mL | Freq: Two times a day (BID) | INTRAVENOUS | Status: DC
  Administered 2018-11-05 – 2018-11-09 (×6): 3 mL via INTRAVENOUS

## 2018-11-04 MED ORDER — PRO-STAT SUGAR FREE PO LIQD
30.0000 mL | Freq: Two times a day (BID) | ORAL | Status: DC
  Administered 2018-11-05 (×2): 30 mL via ORAL
  Filled 2018-11-04 (×3): qty 30

## 2018-11-04 MED ORDER — SODIUM CHLORIDE 0.9 % IV BOLUS
1000.0000 mL | Freq: Once | INTRAVENOUS | Status: AC
  Administered 2018-11-04: 1000 mL via INTRAVENOUS

## 2018-11-04 NOTE — ED Triage Notes (Signed)
Patient was found at an extended stay sitting on the floor. He appeared weak and confused so EMS was called. EMS glucometer read high CBG. EMS also noted his disorientation to place. He has a history of AMS and hyperglycemia.   EMS vitals: 110/50 BP 92 HR 16 Resp rate

## 2018-11-04 NOTE — Progress Notes (Signed)
ED TO INPATIENT HANDOFF REPORT  Name/Age/Gender [name] 63 y.o. male  Code Status    Code Status Orders  (From admission, onward)         Start     Ordered   11/04/18 2212  Full code  Continuous     11/04/18 2213        Code Status History    Date Active Date Inactive Code Status Order ID Comments User Context   05/31/2018 1428 06/02/2018 2317 Full Code [id number]  Reubin Milan, MD Inpatient   11/03/2015 0434 11/03/2015 1417 Full Code [id number]  Palumbo, April, MD ED   10/31/2015 1823 11/01/2015 1853 Full Code [id number]  Patrecia Pour, NP Inpatient   10/30/2015 1859 10/31/2015 1823 Full Code [id number]  Davonna Belling, MD ED   04/21/2015 1806 04/24/2015 1551 Full Code [id number]  Patrecia Pour, NP Inpatient   04/21/2015 1235 04/21/2015 1806 Full Code [id number]  Quintella Reichert, MD ED   03/04/2015 1910 03/11/2015 1459 Full Code [id number]  Marjie Skiff, MD Inpatient   03/02/2015 2345 03/04/2015 1910 Full Code [id number]  Camprubi-Soms, Dewitt Hoes, PA-C ED   12/01/2014 0650 12/02/2014 2205 Full Code [id number]  Palumbo, April, MD ED   11/20/2014 0600 11/23/2014 1751 Full Code [id number]  Charlann Lange, PA-C ED   01/15/2014 0514 01/15/2014 2058 Full Code [id number]  Palumbo-Rasch, April K, MD ED   10/31/2013 1158 11/05/2013 1507 Full Code [id number]  Encarnacion Slates, NP Inpatient   10/31/2013 0127 10/31/2013 1158 Full Code [id number]  Antonietta Breach, PA-C ED   10/30/2013 0455 10/30/2013 0909 Full Code [id number]  Evelina Bucy, MD ED   09/12/2013 1058 09/16/2013 1729 Full Code [id number]  Earleen Newport, NP Inpatient   09/11/2013 1405 09/12/2013 1058 Full Code [id number]  Elwyn Lade, PA-C ED   08/24/2013 1348 08/25/2013 0136 Full Code [id number]  Alvina Chou, PA-C ED   06/19/2013 1314 06/20/2013 0114 Full Code [id number]  Elwyn Lade, PA-C ED   10/13/2012 0712 10/13/2012 2032 Full Code [id number]  Johnna Acosta, MD ED   09/27/2012 0739 09/27/2012 2332 Full Code [id number]  Blanchie Dessert, MD ED   01/27/2012 1935 01/29/2012 0547 Full Code [id number]  Leota Jacobsen, MD ED   01/09/2012 1712 01/10/2012 1917 Full Code [id number]  Julieta Bellini, NP ED   12/21/2011 0725 12/22/2011 0052 Full Code [id number]  Kalman Drape, MD ED   11/05/2011 0516 11/06/2011 1658 Full Code [id number]  Martie Lee, PA ED   Advance Care Planning Activity      Home/SNF/Other Home  Chief Complaint altered mental status  Level of Care/Admitting Diagnosis ED Disposition    ED Disposition Condition [place]: [facility] [100102]  Level of Care: Telemetry [5]  Admit to tele based on following criteria: Other see comments  Comments: monitor for ischemia  Covid Evaluation: Screening Protocol (No Symptoms)  Diagnosis: Symptomatic anemia [6468032]  Admitting Physician: Jani Gravel [[id number]]  Attending Physician: Jani Gravel [[id number]]  PT Class (Do Not Modify): Observation [104]  PT Acc Code (Do Not Modify): Observation [10022]       Medical History Past Medical History:  Diagnosis Date  . Alcoholic ([place])   . Alcoholic cirrhosis of liver ([place])    Pt calls this a false diagnosis. Pt denies  . Alcoholism /alcohol abuse ([place])   . Anxiety   . Cancer ([place])   . Chronic bronchitis ([place])   . Chronic hyponatremia  pt denies having   . Depression   . Diabetes mellitus   . Diabetes mellitus without complication ([place])   . Hepatitis C   . Hyperglycemia   . Neuropathy   . Substance abuse ([place])   . Thrombocytopenia ([place])   . Tobacco use 05/31/2018    Allergies Allergies  Allergen Reactions  . Benadryl [Diphenhydramine Hcl] Other (See Comments)    "Causes nervousness"  . Benadryl [Diphenhydramine] Other (See Comments)    "makes my skin crawl"  . Sulfa Antibiotics Other (See Comments)    Childhood allergy  . Tegretol [Carbamazepine] Other (See Comments)    "almost killed me"  . Librium [Chlordiazepoxide Hcl] Anxiety  . Librium [Chlordiazepoxide] Anxiety  .  Sulfa Antibiotics Rash    IV Location/Drains/Wounds Patient Lines/Drains/Airways Status   Active Line/Drains/Airways    Name:   Placement date:   Placement time:   Site:   Days:   Peripheral IV 11/04/18 Left Antecubital   11/04/18    1823    Antecubital   less than 1          Labs/Imaging Results for orders placed or performed during the hospital encounter of 11/04/18 (from the past 48 hour(s))  CBG monitoring, ED     Status: Abnormal   Collection Time: 11/04/18  6:37 PM  Result Value Ref Range   Glucose-Capillary 498 (H) 70 - 99 mg/dL  Comprehensive metabolic panel     Status: Abnormal   Collection Time: 11/04/18  6:45 PM  Result Value Ref Range   Sodium 127 (L) 135 - 145 mmol/L   Potassium 4.4 3.5 - 5.1 mmol/L   Chloride 88 (L) 98 - 111 mmol/L   CO2 33 (H) 22 - 32 mmol/L   Glucose, Bld 533 (HH) 70 - 99 mg/dL    Comment: CRITICAL RESULT CALLED TO, READ BACK BY AND VERIFIED WITH: TALKINGTON,J RN _0  ON 11/04/2018 JACKSON,K    BUN 25 (H) 8 - 23 mg/dL   Creatinine, Ser 0.95 0.61 - 1.24 mg/dL   Calcium 7.8 (L) 8.9 - 10.3 mg/dL   Total Protein 6.0 (L) 6.5 - 8.1 g/dL   Albumin 1.6 (L) 3.5 - 5.0 g/dL   AST 31 15 - 41 U/L   ALT 14 0 - 44 U/L   Alkaline Phosphatase 144 (H) 38 - 126 U/L   Total Bilirubin 1.0 0.3 - 1.2 mg/dL   GFR calc non Af Amer >60 >60 mL/min   GFR calc Af Amer >60 >60 mL/min   Anion gap 6 5 - 15    Comment: Performed at [facility], [place] [address] [place], [place] [address]  Lipase, blood     Status: None   Collection Time: 11/04/18  6:45 PM  Result Value Ref Range   Lipase 21 11 - 51 U/L    Comment: Performed at [facility], [place] [address] [place], [place] [address]  CBC with Differential     Status: Abnormal   Collection Time: 11/04/18  6:45 PM  Result Value Ref Range   WBC 10.8 (H) 4.0 - 10.5 K/uL   RBC 2.07 (L) 4.22 - 5.81 MIL/uL   Hemoglobin 6.4 (LL) 13.0 - 17.0 g/dL    Comment: This critical result has  verified and been called to J.TALKINGTON,RN by Sandi Mealy on 06 27 2020 at 1943, and has been read back.    HCT 19.8 (L) 39.0 - 52.0 %   MCV 95.7 80.0 - 100.0 fL   MCH  30.9 26.0 - 34.0 pg   MCHC 32.3 30.0 - 36.0 g/dL   RDW 17.9 (H) 11.5 - 15.5 %   Platelets 199 150 - 400 K/uL   nRBC 0.0 0.0 - 0.2 %   Neutrophils Relative % 70 %   Neutro Abs 7.6 1.7 - 7.7 K/uL   Lymphocytes Relative 15 %   Lymphs Abs 1.6 0.7 - 4.0 K/uL   Monocytes Relative 12 %   Monocytes Absolute 1.3 (H) 0.1 - 1.0 K/uL   Eosinophils Relative 2 %   Eosinophils Absolute 0.2 0.0 - 0.5 K/uL   Basophils Relative 0 %   Basophils Absolute 0.0 0.0 - 0.1 K/uL   Immature Granulocytes 1 %   Abs Immature Granulocytes 0.06 0.00 - 0.07 K/uL    Comment: Performed at [facility], [place] [address] [place], [place] [address]  Urinalysis, Routine w reflex microscopic     Status: Abnormal   Collection Time: 11/04/18  6:45 PM  Result Value Ref Range   Color, Urine YELLOW YELLOW   APPearance CLEAR CLEAR   Specific Gravity, Urine 1.025 1.005 - 1.030   pH 6.0 5.0 - 8.0   Glucose, UA 150 (A) NEGATIVE mg/dL   Hgb urine dipstick MODERATE (A) NEGATIVE   Bilirubin Urine NEGATIVE NEGATIVE   Ketones, ur NEGATIVE NEGATIVE mg/dL   Protein, ur 100 (A) NEGATIVE mg/dL   Nitrite NEGATIVE NEGATIVE   Leukocytes,Ua NEGATIVE NEGATIVE   RBC / HPF 6-10 0 - 5 RBC/hpf   WBC, UA 0-5 0 - 5 WBC/hpf   Bacteria, UA NONE SEEN NONE SEEN    Comment: Performed at [facility], [place] [address] [place], [place] [address]  Blood gas, venous     Status: Abnormal   Collection Time: 11/04/18  6:50 PM  Result Value Ref Range   FIO2 21.00    Delivery systems ROOM AIR    pH, Ven 7.442 (H) 7.250 - 7.430   pCO2, Ven 53.7 44.0 - 60.0 mmHg   pO2, Ven 35.8 32.0 - 45.0 mmHg   Bicarbonate 36.1 (H) 20.0 - 28.0 mmol/L   Acid-Base Excess 10.7 (H) 0.0 - 2.0 mmol/L   O2 Saturation 61.7 %   Patient temperature 98.6    Collection  site VEIN    Drawn by COLLECTED BY NURSE    Sample type VENOUS     Comment: Performed at [facility] [address], [place], [place] [address]  POC occult blood, ED     Status: Abnormal   Collection Time: 11/04/18  8:46 PM  Result Value Ref Range   Fecal Occult Bld POSITIVE (A) NEGATIVE  Type and screen [place]     Status: None (Preliminary result)   Collection Time: 11/04/18 10:04 PM  Result Value Ref Range   ABO/RH(D) O POS    Antibody Screen NEG    Sample Expiration 11/07/2018,2359    Unit Number [id number]    Blood Component Type RED CELLS,LR    Unit division 00    Status of Unit ALLOCATED    Transfusion Status OK TO TRANSFUSE    Crossmatch Result      Compatible Performed at [facility], [place] [address] [place], [place] [address]   Prepare RBC     Status: None   Collection Time: 11/04/18 10:04 PM  Result Value Ref Range   Order Confirmation      ORDER PROCESSED BY BLOOD BANK Performed at [facility],  [place] [address] [place], [place] [address]    Ct Head Wo Contrast  Result Date: 11/04/2018 CLINICAL DATA:  63 year old male with altered mental status. EXAM: CT HEAD WITHOUT CONTRAST TECHNIQUE: Contiguous axial images were obtained from the base of the skull through the vertex without intravenous contrast. COMPARISON:  Head CT dated 04/27/2009 FINDINGS: Brain: Mild to moderate age-related atrophy and chronic microvascular ischemic changes. There is no acute intracranial hemorrhage. No mass effect or midline shift. No extra-axial fluid collection. Vascular: No hyperdense vessel or unexpected calcification. Skull: Normal. Negative for fracture or focal lesion. Sinuses/Orbits: No acute finding. Other: None IMPRESSION: 1. No acute intracranial hemorrhage. 2. Age-related atrophy and chronic microvascular ischemic changes. Electronically Signed   By: Anner Crete M.D.   On: 11/04/2018 22:01     Pending Labs Unresulted Labs (From admission, onward)    Start     Ordered   11/05/18 0500  Comprehensive metabolic panel  Tomorrow morning,   R     11/04/18 2213   11/05/18 0500  CBC  Tomorrow morning,   R     11/04/18 2213   11/04/18 2258  Ammonia  ONCE - STAT,   STAT     11/04/18 2257   11/04/18 2253  SARS Coronavirus 2 (CEPHEID - Performed in [place] hospital lab), [facility] Order  Once,   STAT    Question:  Rule Out  Answer:  Yes   11/04/18 2253   11/04/18 1915  Ethanol  Once,   R     11/04/18 1915          Vitals/Pain Today's Vitals   11/04/18 1932 11/04/18 2108 11/04/18 2202 11/04/18 2203  BP: (!) 151/77 129/64 (!) 156/81   Pulse: 92 91  88  Resp: 16 12    Temp:      SpO2: 98% 93%  95%    Isolation Precautions No active isolations  Medications Medications  0.9 %  sodium chloride infusion (has no administration in time range)  sodium chloride flush (NS) 0.9 % injection 3 mL (has no administration in time range)  sodium chloride flush (NS) 0.9 % injection 3 mL (has no administration in time range)  0.9 %  sodium chloride infusion (has no administration in time range)  feeding supplement (PRO-STAT SUGAR FREE 64) liquid 30 mL (has no administration in time range)  insulin aspart (novoLOG) injection 6 Units (has no administration in time range)  insulin aspart (novoLOG) injection 0-9 Units (has no administration in time range)  sodium chloride 0.9 % bolus 1,000 mL (1,000 mLs Intravenous New Bag/Given 11/04/18 2020)    Mobility walks

## 2018-11-04 NOTE — ED Notes (Signed)
Bed: WA03 Expected date:  Expected time:  Means of arrival:  Comments: Hyperglycemia AMS

## 2018-11-04 NOTE — ED Provider Notes (Signed)
[facility] DEPT Provider Note   CSN: [id number] Arrival date & time: 11/04/18  1812    History   Chief Complaint Chief Complaint  Patient presents with  . Altered Mental Status  . Weakness  . Hyperglycemia    HPI [name] is a 62 y.o. male.     HPI Patient presents after being found in a apartment complex essentially unresponsive.  The patient self is confabulating, offering a rambling account, tangential response to questions. He is oriented grossly to place, not to time, but to self. EMS reports the patient was found sitting on the floor, weak and confused in appearance, and on their arrival they found him similarly disoriented, with critically abnormal glucose value on their portable device.  Level 5 caveat secondary to confusion.    Past Medical History:  Diagnosis Date  . Alcoholic ([place])   . Alcoholic cirrhosis of liver ([place])    Pt calls this a false diagnosis. Pt denies  . Alcoholism /alcohol abuse ([place])   . Anxiety   . Cancer ([place])   . Chronic bronchitis ([place])   . Chronic hyponatremia    pt denies having   . Depression   . Diabetes mellitus   . Diabetes mellitus without complication ([place])   . Hepatitis C   . Hyperglycemia   . Neuropathy   . Substance abuse ([name])   . Thrombocytopenia ([place])   . Tobacco use 05/31/2018    Patient Active Problem List   Diagnosis Date Noted  . Cancer, hepatocellular ([place]) 09/06/2018  . Alcoholic cirrhosis of liver with ascites ([place])   . Hepatocellular carcinoma ([place])   . Elevated CEA   . Secondary esophageal varices without bleeding ([place])   . Liver mass 05/31/2018  . Neuropathy 05/31/2018  . Hypomagnesemia 05/31/2018  . Anemia 05/31/2018  . Hypoglycemia 05/31/2018  . Type 2 diabetes mellitus ([place]) 05/31/2018  . Leukocytosis 05/31/2018  . Tobacco use 05/31/2018  . Lung nodule < 6cm on CT 05/31/2018  . Chronic hepatitis C ([place]) 01/11/2018  . Alcohol-induced mood disorder ([place])  10/31/2015  . Moderate major depression ([place]) 04/21/2015  . Major depressive disorder, single episode, moderate ([place]) 03/10/2015  . Alcohol dependence with uncomplicated withdrawal ([place]) 03/03/2015  . Alcohol abuse   . Suicidal ideation   . Suicidal ideation 11/20/2014  . Anxiousness 09/11/2013  . Substance induced mood disorder ([place]) 06/20/2013  . S/P alcohol detoxification 09/27/2012  . Alcohol dependence ([place]) 01/29/2012  . Type II or unspecified type diabetes mellitus without mention of complication, uncontrolled 11/11/2011  . Thrombocytopenia ([place])   . Hyperglycemia   . Alcoholic ([place])   . Chronic hyponatremia   . Alcohol dependence with acute alcoholic intoxication ([place]) 11/06/2011  . Diabetes mellitus ([place]) 11/06/2011  . Hepatitis C 11/06/2011    Past Surgical History:  Procedure Laterality Date  . BIOPSY  06/12/2018   Procedure: BIOPSY;  Surgeon: Mauri Pole, MD;  Location: [facility];  Service: Endoscopy;;  . COLONOSCOPY WITH PROPOFOL N/A 06/12/2018   Procedure: COLONOSCOPY WITH PROPOFOL;  Surgeon: Mauri Pole, MD;  Location: [facility];  Service: Endoscopy;  Laterality: N/A;  . ESOPHAGOGASTRODUODENOSCOPY (EGD) WITH PROPOFOL N/A 06/12/2018   Procedure: ESOPHAGOGASTRODUODENOSCOPY (EGD) WITH PROPOFOL;  Surgeon: Mauri Pole, MD;  Location: [facility];  Service: Endoscopy;  Laterality: N/A;  . FRACTURE SURGERY  right lower leg plate placed years ago  . IR RADIOLOGIST EVAL & MGMT  06/29/2018  . Plate in lower right leg  2008  .  RADIOFREQUENCY ABLATION N/A 09/06/2018   Procedure: MICROWAVE THERMAL ABLATION;  Surgeon: Aletta Edouard, MD;  Location: [facility];  Service: Anesthesiology;  Laterality: N/A;        Home Medications    Prior to Admission medications   Medication Sig Start Date End Date Taking? Authorizing Provider  albuterol (PROVENTIL HFA;VENTOLIN HFA) 108 (90 Base) MCG/ACT inhaler Inhale 2 puffs into the lungs every 6 (six) hours as needed  for wheezing or shortness of breath.    [provider]  furosemide (LASIX) 20 MG tablet Take 1 tablet (20 mg total) by mouth 2 (two) times daily in the am and at bedtime.. 10/26/18   Esterwood, Amy S, PA-C  gabapentin (NEURONTIN) 400 MG capsule Take 4 capsules (1,600 mg total) by mouth 3 (three) times daily. 10/20/18   Ladell Pier, MD  insulin glargine (LANTUS) 100 UNIT/ML injection Inject 0.3 mLs (30 Units total) into the skin daily. Patient taking differently: Inject 30 Units into the skin daily after breakfast.  06/02/18   Mercy Riding, MD  Lactulose 20 GM/30ML SOLN 30 cc's p.o twice daily every day 10/26/18   Esterwood, Amy S, PA-C  metFORMIN (GLUCOPHAGE) 1000 MG tablet Take 1 tablet (1,000 mg total) by mouth 2 (two) times daily with a meal. 11/01/15   Patrecia Pour, NP  NONFORMULARY OR COMPOUNDED ITEM Bedside commode (over commode chair)    [employer] 10/26/18   Esterwood, Amy S, PA-C  ondansetron (ZOFRAN) 8 MG tablet Take 1 tablet (8 mg total) by mouth every 8 (eight) hours as needed for nausea or vomiting. 10/20/18   Ladell Pier, MD  pantoprazole (PROTONIX) 40 MG tablet Take 1 tablet (40 mg total) by mouth every morning. 10/26/18   Esterwood, Amy S, PA-C  potassium chloride SA (K-DUR) 20 MEQ tablet Take 1 tablet (20 mEq total) by mouth daily. 10/20/18   Ladell Pier, MD  promethazine (PHENERGAN) 12.5 MG tablet Take 1 tablet (12.5 mg total) by mouth every 6 (six) hours as needed for nausea or vomiting. 10/26/18   Esterwood, Amy S, PA-C  spironolactone (ALDACTONE) 50 MG tablet Take 1 tablet (50 mg total) by mouth once for 1 dose. 10/26/18 10/26/18  Esterwood, Amy Chauncey Cruel, PA-C    Family History Family History  Problem Relation Age of Onset  . ALS Father   . Colon cancer Neg Hx   . Colon polyps Neg Hx   . Esophageal cancer Neg Hx   . Rectal cancer Neg Hx   . Stomach cancer Neg Hx     Social History Social History   Tobacco Use  . Smoking status: Current Every Day  Smoker    Packs/day: 0.50    Years: 40.00    Pack years: 20.00    Types: Cigarettes  . Smokeless tobacco: Never Used  Substance Use Topics  . Alcohol use: Not Currently    Comment: 18/daily- reports has not drink in 2 1/2years  . Drug use: Yes    Frequency: 3.0 times per week    Types: Marijuana     Allergies   Benadryl [diphenhydramine hcl], Benadryl [diphenhydramine], Sulfa antibiotics, Tegretol [carbamazepine], Librium [chlordiazepoxide hcl], Librium [chlordiazepoxide], and Sulfa antibiotics   Review of Systems Review of Systems  Unable to perform ROS: Mental status change     Physical Exam Updated Vital Signs BP (!) 151/77   Pulse 92   Temp 98.5 F (36.9 C)   Resp 16   SpO2 98%   Physical Exam Vitals  signs and nursing note reviewed.  Constitutional:      General: He is not in acute distress.    Appearance: He is well-developed. He is ill-appearing.  HENT:     Head: Normocephalic and atraumatic.  Eyes:     Conjunctiva/sclera: Conjunctivae normal.  Cardiovascular:     Rate and Rhythm: Normal rate and regular rhythm.  Pulmonary:     Effort: Pulmonary effort is normal. No respiratory distress.     Breath sounds: No stridor.  Abdominal:     General: There is no distension.  Skin:    General: Skin is warm and dry.  Neurological:     Mental Status: He is alert.     Comments: Confused, but moving all extremity spontaneously, following commands similarly  Psychiatric:        Behavior: Behavior is withdrawn.        Cognition and Memory: Cognition is impaired.      ED Treatments / Results  Labs (all labs ordered are listed, but only abnormal results are displayed) Labs Reviewed  COMPREHENSIVE METABOLIC PANEL - Abnormal; Notable for the following components:      Result Value   Sodium 127 (*)    Chloride 88 (*)    CO2 33 (*)    Glucose, Bld 533 (*)    BUN 25 (*)    Calcium 7.8 (*)    Total Protein 6.0 (*)    Albumin 1.6 (*)    Alkaline Phosphatase  144 (*)    All other components within normal limits  CBC WITH DIFFERENTIAL/PLATELET - Abnormal; Notable for the following components:   WBC 10.8 (*)    RBC 2.07 (*)    Hemoglobin 6.4 (*)    HCT 19.8 (*)    RDW 17.9 (*)    Monocytes Absolute 1.3 (*)    All other components within normal limits  BLOOD GAS, VENOUS - Abnormal; Notable for the following components:   pH, Ven 7.442 (*)    Bicarbonate 36.1 (*)    Acid-Base Excess 10.7 (*)    All other components within normal limits  URINALYSIS, ROUTINE W REFLEX MICROSCOPIC - Abnormal; Notable for the following components:   Glucose, UA 150 (*)    Hgb urine dipstick MODERATE (*)    Protein, ur 100 (*)    All other components within normal limits  CBG MONITORING, ED - Abnormal; Notable for the following components:   Glucose-Capillary 498 (*)    All other components within normal limits  POC OCCULT BLOOD, ED - Abnormal; Notable for the following components:   Fecal Occult Bld POSITIVE (*)    All other components within normal limits  LIPASE, BLOOD  ETHANOL  TYPE AND SCREEN  PREPARE RBC (CROSSMATCH)    EKG None  Radiology No results found.  Procedures Procedures (including critical care time)  Medications Ordered in ED Medications  0.9 %  sodium chloride infusion (has no administration in time range)  sodium chloride 0.9 % bolus 1,000 mL (1,000 mLs Intravenous New Bag/Given 11/04/18 2020)     Initial Impression / Assessment and Plan / ED Course  I have reviewed the triage vital signs and the nursing notes.  Pertinent labs & imaging results that were available during my care of the patient were reviewed by me and considered in my medical decision making (see chart for details).        8:58 PM Patient much more interactive, remains hemodynamically about the same. Initial labs are notable for worsening anemic  beyond baseline, with a critically abnormal value less than 7. He is Hemoccult positive. On reviewing the  patient's chart, additional pertinent details including cirrhosis, alcohol abuse, and some metaplasia concerning for hepatocellular malignancy all notable, with most recent gastroenterology clinic note summarized below: #97 63 year old white male with decompensated cirrhosis secondary to hepatitis C and EtOH.  Patient completed treatment for hepatitis C with Epclusa December 2019 with sustained viral response.   Question continued active EtOH use   Failure to thrive over the past 3 to 4 months with significant weight loss and persistent nausea   #2 multifocal hepatocellular carcinoma, atypical cells noted in peritoneal fluid on cytology from January 2020 Patient is status post thermal ablation of 2 lesions 09/06/2018 Recommendation was for follow-up MRI of the abdomen in 3 months   Rule out progressive and/or metastatic disease   #3 grade 1 esophageal varices and portal gastropathy #4 peripheral neuropathy #5 failure to thrive-with significant weight loss, deconditioning, and repeated falls   #6 unsteady gait and intermittent confusion-rule out secondary to encephalopathy though suspect his recurrent falls are multifactoral   #7 adult onset diabetes mellitus #8 anemia- chronic  Have discussed the patient's case with his GI team will follow as a consulting team in the morning. Patient has received fluids, will receive 10 units of insulin, ongoing fluids for concern for nonketotic hyperosmolar state, and given his need for blood transfusion, he will require admission for further monitoring, management for symptomatic anemia, confusion, as above hyperglycemia. Initial head CT reassuring, vitals reassuring. Final Clinical Impressions(s) / ED Diagnoses   Final diagnoses:  Stupor  Symptomatic anemia  Hyperosmolar non-ketotic state in patient with type 2 diabetes mellitus ([place])  CRITICAL CARE Performed by: Carmin Muskrat Total critical care time: 35 minutes Critical care time was exclusive  of separately billable procedures and treating other patients. Critical care was necessary to treat or prevent imminent or life-threatening deterioration. Critical care was time spent personally by me on the following activities: development of treatment plan with patient and/or surrogate as well as nursing, discussions with consultants, evaluation of patient's response to treatment, examination of patient, obtaining history from patient or surrogate, ordering and performing treatments and interventions, ordering and review of laboratory studies, ordering and review of radiographic studies, pulse oximetry and re-evaluation of patient's condition.    Carmin Muskrat, MD 11/04/18 2328

## 2018-11-04 NOTE — ED Notes (Signed)
Report called to receiving RN. MD made aware of receiving RN concerns.

## 2018-11-05 ENCOUNTER — Observation Stay (HOSPITAL_COMMUNITY): Payer: Medicaid Other

## 2018-11-05 ENCOUNTER — Other Ambulatory Visit: Payer: Self-pay

## 2018-11-05 DIAGNOSIS — D649 Anemia, unspecified: Secondary | ICD-10-CM

## 2018-11-05 DIAGNOSIS — F10230 Alcohol dependence with withdrawal, uncomplicated: Secondary | ICD-10-CM | POA: Diagnosis not present

## 2018-11-05 DIAGNOSIS — R945 Abnormal results of liver function studies: Secondary | ICD-10-CM | POA: Diagnosis not present

## 2018-11-05 DIAGNOSIS — Z781 Physical restraint status: Secondary | ICD-10-CM | POA: Diagnosis not present

## 2018-11-05 DIAGNOSIS — Z681 Body mass index (BMI) 19 or less, adult: Secondary | ICD-10-CM | POA: Diagnosis not present

## 2018-11-05 DIAGNOSIS — K7290 Hepatic failure, unspecified without coma: Secondary | ICD-10-CM | POA: Diagnosis not present

## 2018-11-05 DIAGNOSIS — F1290 Cannabis use, unspecified, uncomplicated: Secondary | ICD-10-CM | POA: Diagnosis present

## 2018-11-05 DIAGNOSIS — Z7189 Other specified counseling: Secondary | ICD-10-CM | POA: Diagnosis not present

## 2018-11-05 DIAGNOSIS — R401 Stupor: Secondary | ICD-10-CM | POA: Diagnosis present

## 2018-11-05 DIAGNOSIS — E43 Unspecified severe protein-calorie malnutrition: Secondary | ICD-10-CM | POA: Diagnosis present

## 2018-11-05 DIAGNOSIS — R627 Adult failure to thrive: Secondary | ICD-10-CM | POA: Diagnosis present

## 2018-11-05 DIAGNOSIS — F1021 Alcohol dependence, in remission: Secondary | ICD-10-CM | POA: Diagnosis present

## 2018-11-05 DIAGNOSIS — Z9181 History of falling: Secondary | ICD-10-CM | POA: Diagnosis not present

## 2018-11-05 DIAGNOSIS — B182 Chronic viral hepatitis C: Secondary | ICD-10-CM | POA: Diagnosis present

## 2018-11-05 DIAGNOSIS — F419 Anxiety disorder, unspecified: Secondary | ICD-10-CM | POA: Diagnosis present

## 2018-11-05 DIAGNOSIS — Z1159 Encounter for screening for other viral diseases: Secondary | ICD-10-CM | POA: Diagnosis not present

## 2018-11-05 DIAGNOSIS — J189 Pneumonia, unspecified organism: Secondary | ICD-10-CM | POA: Diagnosis present

## 2018-11-05 DIAGNOSIS — E512 Wernicke's encephalopathy: Secondary | ICD-10-CM | POA: Diagnosis present

## 2018-11-05 DIAGNOSIS — Z66 Do not resuscitate: Secondary | ICD-10-CM | POA: Diagnosis not present

## 2018-11-05 DIAGNOSIS — E871 Hypo-osmolality and hyponatremia: Secondary | ICD-10-CM | POA: Diagnosis present

## 2018-11-05 DIAGNOSIS — E1140 Type 2 diabetes mellitus with diabetic neuropathy, unspecified: Secondary | ICD-10-CM | POA: Diagnosis present

## 2018-11-05 DIAGNOSIS — C220 Liver cell carcinoma: Secondary | ICD-10-CM | POA: Diagnosis present

## 2018-11-05 DIAGNOSIS — R410 Disorientation, unspecified: Secondary | ICD-10-CM | POA: Diagnosis not present

## 2018-11-05 DIAGNOSIS — E1100 Type 2 diabetes mellitus with hyperosmolarity without nonketotic hyperglycemic-hyperosmolar coma (NKHHC): Secondary | ICD-10-CM | POA: Diagnosis present

## 2018-11-05 DIAGNOSIS — Z23 Encounter for immunization: Secondary | ICD-10-CM | POA: Diagnosis not present

## 2018-11-05 DIAGNOSIS — Y95 Nosocomial condition: Secondary | ICD-10-CM | POA: Diagnosis present

## 2018-11-05 DIAGNOSIS — K7200 Acute and subacute hepatic failure without coma: Secondary | ICD-10-CM | POA: Diagnosis present

## 2018-11-05 DIAGNOSIS — D689 Coagulation defect, unspecified: Secondary | ICD-10-CM | POA: Diagnosis present

## 2018-11-05 DIAGNOSIS — K7031 Alcoholic cirrhosis of liver with ascites: Secondary | ICD-10-CM | POA: Diagnosis present

## 2018-11-05 DIAGNOSIS — Z515 Encounter for palliative care: Secondary | ICD-10-CM | POA: Diagnosis not present

## 2018-11-05 DIAGNOSIS — F1010 Alcohol abuse, uncomplicated: Secondary | ICD-10-CM

## 2018-11-05 DIAGNOSIS — R4182 Altered mental status, unspecified: Secondary | ICD-10-CM | POA: Diagnosis present

## 2018-11-05 DIAGNOSIS — C7931 Secondary malignant neoplasm of brain: Secondary | ICD-10-CM | POA: Diagnosis present

## 2018-11-05 LAB — COMPREHENSIVE METABOLIC PANEL
ALT: 15 U/L (ref 0–44)
AST: 33 U/L (ref 15–41)
Albumin: 1.6 g/dL — ABNORMAL LOW (ref 3.5–5.0)
Alkaline Phosphatase: 134 U/L — ABNORMAL HIGH (ref 38–126)
Anion gap: 7 (ref 5–15)
BUN: 22 mg/dL (ref 8–23)
CO2: 31 mmol/L (ref 22–32)
Calcium: 7.7 mg/dL — ABNORMAL LOW (ref 8.9–10.3)
Chloride: 94 mmol/L — ABNORMAL LOW (ref 98–111)
Creatinine, Ser: 0.65 mg/dL (ref 0.61–1.24)
GFR calc Af Amer: >60 mL/min (ref >60)
GFR calc non Af Amer: >60 mL/min (ref >60)
Glucose, Bld: 299 mg/dL — ABNORMAL HIGH (ref 70–99)
Potassium: 4.1 mmol/L (ref 3.5–5.1)
Sodium: 132 mmol/L — ABNORMAL LOW (ref 135–145)
Total Bilirubin: 1.2 mg/dL (ref 0.3–1.2)
Total Protein: 5.9 g/dL — ABNORMAL LOW (ref 6.5–8.1)

## 2018-11-05 LAB — CBC
HCT: 24.5 % — ABNORMAL LOW (ref 39.0–52.0)
Hemoglobin: 8 g/dL — ABNORMAL LOW (ref 13.0–17.0)
MCH: 31.3 pg (ref 26.0–34.0)
MCHC: 32.7 g/dL (ref 30.0–36.0)
MCV: 95.7 fL (ref 80.0–100.0)
Platelets: 226 10*3/uL (ref 150–400)
RBC: 2.56 MIL/uL — ABNORMAL LOW (ref 4.22–5.81)
RDW: 18 % — ABNORMAL HIGH (ref 11.5–15.5)
WBC: 12.3 10*3/uL — ABNORMAL HIGH (ref 4.0–10.5)
nRBC: 0 % (ref 0.0–0.2)

## 2018-11-05 LAB — GLUCOSE, CAPILLARY
Glucose-Capillary: 124 mg/dL — ABNORMAL HIGH (ref 70–99)
Glucose-Capillary: 134 mg/dL — ABNORMAL HIGH (ref 70–99)
Glucose-Capillary: 137 mg/dL — ABNORMAL HIGH (ref 70–99)
Glucose-Capillary: 153 mg/dL — ABNORMAL HIGH (ref 70–99)
Glucose-Capillary: 252 mg/dL — ABNORMAL HIGH (ref 70–99)
Glucose-Capillary: 321 mg/dL — ABNORMAL HIGH (ref 70–99)
Glucose-Capillary: 460 mg/dL — ABNORMAL HIGH (ref 70–99)

## 2018-11-05 LAB — PROTIME-INR
INR: 1.2 (ref 0.8–1.2)
Prothrombin Time: 15.5 seconds — ABNORMAL HIGH (ref 11.4–15.2)

## 2018-11-05 LAB — VITAMIN B12: Vitamin B-12: 815 pg/mL (ref 180–914)

## 2018-11-05 LAB — TSH: TSH: 2.131 u[IU]/mL (ref 0.350–4.500)

## 2018-11-05 LAB — RPR: RPR Ser Ql: NONREACTIVE

## 2018-11-05 LAB — SEDIMENTATION RATE: Sed Rate: 136 mm/hr — ABNORMAL HIGH (ref 0–16)

## 2018-11-05 LAB — LACTIC ACID, PLASMA
Lactic Acid, Venous: 1.9 mmol/L (ref 0.5–1.9)
Lactic Acid, Venous: 1.4 mmol/L (ref 0.5–1.9)

## 2018-11-05 LAB — MRSA PCR SCREENING: MRSA by PCR: NEGATIVE

## 2018-11-05 LAB — ETHANOL: Alcohol, Ethyl (B): <10 mg/dL (ref <10)

## 2018-11-05 LAB — AMMONIA: Ammonia: 37 umol/L — ABNORMAL HIGH (ref 9–35)

## 2018-11-05 LAB — SARS CORONAVIRUS 2 BY RT PCR (HOSPITAL ORDER, PERFORMED IN ~~LOC~~ HOSPITAL LAB): SARS Coronavirus 2: NEGATIVE

## 2018-11-05 MED ORDER — HALOPERIDOL LACTATE 5 MG/ML IJ SOLN
5.0000 mg | Freq: Once | INTRAMUSCULAR | Status: DC
  Filled 2018-11-05: qty 1

## 2018-11-05 MED ORDER — THIAMINE HCL 100 MG/ML IJ SOLN
100.0000 mg | Freq: Every day | INTRAMUSCULAR | Status: DC
  Administered 2018-11-06: 100 mg via INTRAVENOUS
  Filled 2018-11-05 (×2): qty 2

## 2018-11-05 MED ORDER — RIFAXIMIN 550 MG PO TABS
550.0000 mg | ORAL_TABLET | Freq: Two times a day (BID) | ORAL | Status: DC
  Administered 2018-11-05 (×2): 550 mg via ORAL
  Filled 2018-11-05 (×9): qty 1

## 2018-11-05 MED ORDER — HALOPERIDOL LACTATE 5 MG/ML IJ SOLN
5.0000 mg | Freq: Once | INTRAMUSCULAR | Status: AC
  Administered 2018-11-05: 5 mg via INTRAMUSCULAR

## 2018-11-05 MED ORDER — LORAZEPAM 2 MG/ML IJ SOLN
0.0000 mg | Freq: Two times a day (BID) | INTRAMUSCULAR | Status: DC
  Administered 2018-11-07: 2 mg via INTRAVENOUS
  Administered 2018-11-08: 1 mg via INTRAVENOUS
  Filled 2018-11-05 (×2): qty 1

## 2018-11-05 MED ORDER — PANTOPRAZOLE SODIUM 40 MG IV SOLR
40.0000 mg | Freq: Two times a day (BID) | INTRAVENOUS | Status: DC
  Administered 2018-11-05 – 2018-11-09 (×10): 40 mg via INTRAVENOUS
  Filled 2018-11-05 (×10): qty 40

## 2018-11-05 MED ORDER — LORAZEPAM 2 MG/ML IJ SOLN
0.0000 mg | Freq: Four times a day (QID) | INTRAMUSCULAR | Status: DC
  Administered 2018-11-05: 2 mg via INTRAVENOUS
  Administered 2018-11-05 – 2018-11-06 (×2): 1 mg via INTRAVENOUS
  Administered 2018-11-06: 2 mg via INTRAVENOUS
  Filled 2018-11-05 (×5): qty 1

## 2018-11-05 MED ORDER — VITAMIN B-1 100 MG PO TABS
100.0000 mg | ORAL_TABLET | Freq: Every day | ORAL | Status: DC
  Administered 2018-11-05: 100 mg via ORAL
  Filled 2018-11-05: qty 1

## 2018-11-05 MED ORDER — MORPHINE SULFATE (PF) 2 MG/ML IV SOLN
2.0000 mg | INTRAVENOUS | Status: DC | PRN
  Administered 2018-11-05 – 2018-11-07 (×4): 2 mg via INTRAVENOUS
  Filled 2018-11-05 (×4): qty 1

## 2018-11-05 MED ORDER — ADULT MULTIVITAMIN W/MINERALS CH
1.0000 | ORAL_TABLET | Freq: Every day | ORAL | Status: DC
  Filled 2018-11-05: qty 1

## 2018-11-05 MED ORDER — SODIUM CHLORIDE 0.9 % IV SOLN
1.0000 g | INTRAVENOUS | Status: DC
  Administered 2018-11-05: 1 g via INTRAVENOUS
  Filled 2018-11-05: qty 1

## 2018-11-05 MED ORDER — PNEUMOCOCCAL VAC POLYVALENT 25 MCG/0.5ML IJ INJ
0.5000 mL | INJECTION | INTRAMUSCULAR | Status: AC
  Administered 2018-11-06: 0.5 mL via INTRAMUSCULAR
  Filled 2018-11-05: qty 0.5

## 2018-11-05 MED ORDER — PIPERACILLIN-TAZOBACTAM 3.375 G IVPB
3.3750 g | Freq: Three times a day (TID) | INTRAVENOUS | Status: DC
  Administered 2018-11-05 – 2018-11-09 (×11): 3.375 g via INTRAVENOUS
  Filled 2018-11-05 (×13): qty 50

## 2018-11-05 MED ORDER — SPIRONOLACTONE 25 MG PO TABS: 50.0000 mg | ORAL_TABLET | Freq: Every day | ORAL | Status: DC

## 2018-11-05 MED ORDER — LORAZEPAM 2 MG/ML IJ SOLN
1.0000 mg | Freq: Four times a day (QID) | INTRAMUSCULAR | Status: DC | PRN
  Administered 2018-11-07 – 2018-11-08 (×2): 1 mg via INTRAVENOUS
  Filled 2018-11-05 (×3): qty 1

## 2018-11-05 MED ORDER — FUROSEMIDE 20 MG PO TABS
20.0000 mg | ORAL_TABLET | ORAL | Status: DC
  Administered 2018-11-05: 20 mg via ORAL
  Filled 2018-11-05: qty 1

## 2018-11-05 MED ORDER — ONDANSETRON HCL 4 MG PO TABS: 8.0000 mg | ORAL_TABLET | Freq: Three times a day (TID) | ORAL | Status: DC | PRN

## 2018-11-05 MED ORDER — ORAL CARE MOUTH RINSE
15.0000 mL | Freq: Two times a day (BID) | OROMUCOSAL | Status: DC
  Administered 2018-11-05 – 2018-11-09 (×6): 15 mL via OROMUCOSAL

## 2018-11-05 MED ORDER — PROMETHAZINE HCL 25 MG PO TABS: 12.5000 mg | ORAL_TABLET | Freq: Four times a day (QID) | ORAL | Status: DC | PRN

## 2018-11-05 MED ORDER — SPIRONOLACTONE 25 MG PO TABS
50.0000 mg | ORAL_TABLET | Freq: Once | ORAL | Status: AC
  Administered 2018-11-05: 50 mg via ORAL
  Filled 2018-11-05: qty 2

## 2018-11-05 MED ORDER — ALBUTEROL SULFATE (2.5 MG/3ML) 0.083% IN NEBU: 3.0000 mL | INHALATION_SOLUTION | Freq: Four times a day (QID) | RESPIRATORY_TRACT | Status: DC | PRN

## 2018-11-05 MED ORDER — FOLIC ACID 1 MG PO TABS
1.0000 mg | ORAL_TABLET | Freq: Every day | ORAL | Status: DC
  Administered 2018-11-05: 1 mg via ORAL
  Filled 2018-11-05: qty 1

## 2018-11-05 MED ORDER — POTASSIUM CHLORIDE CRYS ER 20 MEQ PO TBCR
20.0000 meq | EXTENDED_RELEASE_TABLET | Freq: Every day | ORAL | Status: DC
  Administered 2018-11-05: 20 meq via ORAL
  Filled 2018-11-05: qty 1

## 2018-11-05 MED ORDER — LACTULOSE 10 GM/15ML PO SOLN
20.0000 g | Freq: Two times a day (BID) | ORAL | Status: DC
  Administered 2018-11-05 (×3): 20 g via ORAL
  Filled 2018-11-05 (×3): qty 30

## 2018-11-05 MED ORDER — LORAZEPAM 1 MG PO TABS
1.0000 mg | ORAL_TABLET | Freq: Four times a day (QID) | ORAL | Status: DC | PRN
  Administered 2018-11-05 – 2018-11-06 (×2): 1 mg via ORAL
  Filled 2018-11-05 (×2): qty 1

## 2018-11-05 NOTE — Progress Notes (Signed)
Pt has been very somnolent all day, only awakening to sternal rub and loud voice (received Ativan and Haldol overnight), so scheduled Ativan was not given for most of day shift. Around 1700, pt was much more alert, but confused, only oriented to person and DOB (disoriented to time, place and situation). Pt insisted he had to have a BM and was very agitated with the bedpan under him, so he was assisted by 2-person assist to the [facility] where he had scant mucous BM. While assisting the pt back to bed, pt got increasingly irritable, agitated, and fearful of staff, and he was trying to pull at his external urinary catheter. He also stated he was "seeing lights" (visual disturbances) and slight tremors were noted in one hand and one foot. 53m Ativan IV were given per the CIWA protocol. Pt now resting comfortably in bed. Will continue to monitor.

## 2018-11-05 NOTE — H&P (Addendum)
TRH H&P    Patient Demographics:    [name], is a 63 y.o. male  MRN: [id number]  DOB - Nov 21, 1955  Admit Date - 11/04/2018  Referring MD/NP/PA: Carmin Muskrat  Outpatient Primary MD for the patient is Placey, Audrea Muscat, NP  Patient coming from:  ???  Chief complaint- anemia   HPI:    [name]  is a 63 y.o. male,  w dm2 w neuropathy,  alcohol dep, Hep C cirrhosis w esophageal varices,  hepatocellular carcinoma, thrombocytopenia,  Tobacco use, apparently was brought by EMS due to confusion and blood sugar reading high.  Pt is a poor historian.   In ED T 98.3  P 89  R 15  Bp 151/60  Pox  98% Wt 71.5 kg  CT brain IMPRESSION: 1. No acute intracranial hemorrhage. 2. Age-related atrophy and chronic microvascular ischemic changes.  Wbc 10.8,  Hgb 6.4, Plt 199 Na 127, K 4.4 Bun 25, Creatinine 0.95 Glucose 533 Hco3  33  covid 19 negative  Pt will be admitted for anemia, and AMS     Review of systems:    In addition to the HPI above,  No Fever-chills, No Headache, No changes with Vision or hearing, No problems swallowing food or Liquids, No Chest pain, Cough or Shortness of Breath, No Abdominal pain, No Nausea or Vomiting, bowel movements are regular, No Blood in stool or Urine, No dysuria, No new skin rashes or bruises, No new joints pains-aches,  No new weakness, tingling, numbness in any extremity, No recent weight gain or loss, No polyuria, polydypsia or polyphagia, No significant Mental Stressors.  All other systems reviewed and are negative.    Past History of the following :    Past Medical History:  Diagnosis Date  . Alcoholic ([place])   . Alcoholic cirrhosis of liver ([place])    Pt calls this a false diagnosis. Pt denies  . Alcoholism /alcohol abuse ([place])   . Anxiety   . Cancer ([place])   . Chronic bronchitis ([place])   . Chronic hyponatremia    pt denies having    . Depression   . Diabetes mellitus   . Diabetes mellitus without complication ([place])   . Hepatitis C   . Hyperglycemia   . Neuropathy   . Substance abuse ([place])   . Thrombocytopenia ([place])   . Tobacco use 05/31/2018      Past Surgical History:  Procedure Laterality Date  . BIOPSY  06/12/2018   Procedure: BIOPSY;  Surgeon: Mauri Pole, MD;  Location: [facility];  Service: Endoscopy;;  . COLONOSCOPY WITH PROPOFOL N/A 06/12/2018   Procedure: COLONOSCOPY WITH PROPOFOL;  Surgeon: Mauri Pole, MD;  Location: [facility];  Service: Endoscopy;  Laterality: N/A;  . ESOPHAGOGASTRODUODENOSCOPY (EGD) WITH PROPOFOL N/A 06/12/2018   Procedure: ESOPHAGOGASTRODUODENOSCOPY (EGD) WITH PROPOFOL;  Surgeon: Mauri Pole, MD;  Location: [facility];  Service: Endoscopy;  Laterality: N/A;  . FRACTURE SURGERY  right lower leg plate placed years ago  . IR RADIOLOGIST EVAL & MGMT  06/29/2018  .  Plate in lower right leg  2008  . RADIOFREQUENCY ABLATION N/A 09/06/2018   Procedure: MICROWAVE THERMAL ABLATION;  Surgeon: Aletta Edouard, MD;  Location: [facility];  Service: Anesthesiology;  Laterality: N/A;      Social History:      Social History   Tobacco Use  . Smoking status: Current Every Day Smoker    Packs/day: 0.50    Years: 40.00    Pack years: 20.00    Types: Cigarettes  . Smokeless tobacco: Never Used  Substance Use Topics  . Alcohol use: Not Currently    Comment: 18/daily- reports has not drink in 2 1/2years       Family History :     Family History  Problem Relation Age of Onset  . ALS Father   . Colon cancer Neg Hx   . Colon polyps Neg Hx   . Esophageal cancer Neg Hx   . Rectal cancer Neg Hx   . Stomach cancer Neg Hx        Home Medications:   Prior to Admission medications   Medication Sig Start Date End Date Taking? Authorizing Provider  albuterol (PROVENTIL HFA;VENTOLIN HFA) 108 (90 Base) MCG/ACT inhaler Inhale 2 puffs into the lungs every 6 (six) hours  as needed for wheezing or shortness of breath.    [provider]  furosemide (LASIX) 20 MG tablet Take 1 tablet (20 mg total) by mouth 2 (two) times daily in the am and at bedtime.. 10/26/18   Esterwood, Amy S, PA-C  gabapentin (NEURONTIN) 400 MG capsule Take 4 capsules (1,600 mg total) by mouth 3 (three) times daily. 10/20/18   Ladell Pier, MD  insulin glargine (LANTUS) 100 UNIT/ML injection Inject 0.3 mLs (30 Units total) into the skin daily. Patient taking differently: Inject 30 Units into the skin daily after breakfast.  06/02/18   Mercy Riding, MD  Lactulose 20 GM/30ML SOLN 30 cc's p.o twice daily every day 10/26/18   Esterwood, Amy S, PA-C  metFORMIN (GLUCOPHAGE) 1000 MG tablet Take 1 tablet (1,000 mg total) by mouth 2 (two) times daily with a meal. 11/01/15   Patrecia Pour, NP  NONFORMULARY OR COMPOUNDED ITEM Bedside commode (over commode chair)    [place] 10/26/18   Esterwood, Amy S, PA-C  ondansetron (ZOFRAN) 8 MG tablet Take 1 tablet (8 mg total) by mouth every 8 (eight) hours as needed for nausea or vomiting. 10/20/18   Ladell Pier, MD  pantoprazole (PROTONIX) 40 MG tablet Take 1 tablet (40 mg total) by mouth every morning. 10/26/18   Esterwood, Amy S, PA-C  potassium chloride SA (K-DUR) 20 MEQ tablet Take 1 tablet (20 mEq total) by mouth daily. 10/20/18   Ladell Pier, MD  promethazine (PHENERGAN) 12.5 MG tablet Take 1 tablet (12.5 mg total) by mouth every 6 (six) hours as needed for nausea or vomiting. 10/26/18   Esterwood, Amy S, PA-C  spironolactone (ALDACTONE) 50 MG tablet Take 1 tablet (50 mg total) by mouth once for 1 dose. 10/26/18 10/26/18  Esterwood, Amy S, PA-C     Allergies:     Allergies  Allergen Reactions  . Benadryl [Diphenhydramine Hcl] Other (See Comments)    "Causes nervousness"  . Benadryl [Diphenhydramine] Other (See Comments)    "makes my skin crawl"  . Sulfa Antibiotics Other (See Comments)    Childhood allergy  . Tegretol  [Carbamazepine] Other (See Comments)    "almost killed me"  . Librium [Chlordiazepoxide Hcl] Anxiety  .  Librium [Chlordiazepoxide] Anxiety  . Sulfa Antibiotics Rash     Physical Exam:   Vitals  Blood pressure (!) 151/81, pulse 93, temperature 98.2 F (36.8 C), temperature source Oral, resp. rate 15, height 6' (1.829 m), weight 71.5 kg, SpO2 96 %.  1.  General: axox3  2. Psychiatric: Upset, pt wants to leave  3. Neurologic: cn2-12 intact, reflexes 2+ symmetric, diffuse with no clonus, motor 5/5 in all 4 ext  4. HEENMT:  Anicteric, pupils 1.70m symmetric, direct, consensual, near intact Neck: no jvd  5. Respiratory : CTAB  6. Cardiovascular : rrr s1, s2   7. Gastrointestinal:  Abd: soft, nt, nd, +bs  8. Skin:  Ext: no c/c/e,  No rash  9.Musculoskeletal:  Good ROM  No adenopathy    Data Review:    CBC Recent Labs  Lab 11/04/18 1845  WBC 10.8*  HGB 6.4*  HCT 19.8*  PLT 199  MCV 95.7  MCH 30.9  MCHC 32.3  RDW 17.9*  LYMPHSABS 1.6  MONOABS 1.3*  EOSABS 0.2  BASOSABS 0.0   ------------------------------------------------------------------------------------------------------------------  Results for orders placed or performed during the hospital encounter of 11/04/18 (from the past 48 hour(s))  CBG monitoring, ED     Status: Abnormal   Collection Time: 11/04/18  6:37 PM  Result Value Ref Range   Glucose-Capillary 498 (H) 70 - 99 mg/dL  Comprehensive metabolic panel     Status: Abnormal   Collection Time: 11/04/18  6:45 PM  Result Value Ref Range   Sodium 127 (L) 135 - 145 mmol/L   Potassium 4.4 3.5 - 5.1 mmol/L   Chloride 88 (L) 98 - 111 mmol/L   CO2 33 (H) 22 - 32 mmol/L   Glucose, Bld 533 (HH) 70 - 99 mg/dL    Comment: CRITICAL RESULT CALLED TO, READ BACK BY AND VERIFIED WITH: TALKINGTON,J RN _0  ON 11/04/2018 JACKSON,K    BUN 25 (H) 8 - 23 mg/dL   Creatinine, Ser 0.95 0.61 - 1.24 mg/dL   Calcium 7.8 (L) 8.9 - 10.3 mg/dL   Total Protein  6.0 (L) 6.5 - 8.1 g/dL   Albumin 1.6 (L) 3.5 - 5.0 g/dL   AST 31 15 - 41 U/L   ALT 14 0 - 44 U/L   Alkaline Phosphatase 144 (H) 38 - 126 U/L   Total Bilirubin 1.0 0.3 - 1.2 mg/dL   GFR calc non Af Amer >60 >60 mL/min   GFR calc Af Amer >60 >60 mL/min   Anion gap 6 5 - 15    Comment: [facility]formed [facility] [address], [id number] Lipase, blood     Status: None   Collection Time: 11/04/18  6:45 PM  Result Value Ref Range   Lipase 21 11 - 51 U/L    Comment: [facility]formed at [facility] [address], [place] [id number] CBC with Differential     Status: Abnormal   Collection Time: 11/04/18  6:45 PM  Result Value Ref Range   WBC 10.8 (H) 4.0 - 10.5 K/uL   RBC 2.07 (L) 4.22 - 5.81 MIL/uL   Hemoglobin 6.4 (LL) 13.0 - 17.0 g/dL    Comment: This critical result has verified and been called to J.TALKINGTON,RN by VSandi Mealyon 06 27 2020 at 1943, and has been read back.    HCT 19.8 (L) 39.0 - 52.0 %   MCV 95.7 80.0 - 100.0 fL   MCH 30.9 26.0 - 34.0  pg   MCHC 32.3 30.0 - 36.0 g/dL   RDW 17.9 (H) 11.5 - 15.5 %   Platelets 199 150 - 400 K/uL   nRBC 0.0 0.0 - 0.2 %   Neutrophils Relative % 70 %   Neutro Abs 7.6 1.7 - 7.7 K/uL   Lymphocytes Relative 15 %   Lymphs Abs 1.6 0.7 - 4.0 K/uL   Monocytes Relative 12 %   Monocytes Absolute 1.3 (H) 0.1 - 1.0 K/uL   Eosinophils Relative 2 %   Eosinophils Absolute 0.2 0.0 - 0.5 K/uL   Basophils Relative 0 %   Basophils Absolute 0.0 0.0 - 0.1 K/uL   Immature Granulocytes 1 %   Abs Immature Granulocytes 0.06 0.00 - 0.07 K/uL    Comment: Performed at [facility], [place] [address] [place], [place] [address]  Urinalysis, Routine w reflex microscopic     Status: Abnormal   Collection Time: 11/04/18  6:45 PM  Result Value Ref Range   Color, Urine YELLOW YELLOW   APPearance CLEAR CLEAR   Specific Gravity, Urine 1.025 1.005 - 1.030   pH 6.0 5.0 - 8.0   Glucose,  UA 150 (A) NEGATIVE mg/dL   Hgb urine dipstick MODERATE (A) NEGATIVE   Bilirubin Urine NEGATIVE NEGATIVE   Ketones, ur NEGATIVE NEGATIVE mg/dL   Protein, ur 100 (A) NEGATIVE mg/dL   Nitrite NEGATIVE NEGATIVE   Leukocytes,Ua NEGATIVE NEGATIVE   RBC / HPF 6-10 0 - 5 RBC/hpf   WBC, UA 0-5 0 - 5 WBC/hpf   Bacteria, UA NONE SEEN NONE SEEN    Comment: Performed at [facility], [place] [address], [place], [place] [address]  Blood gas, venous     Status: Abnormal   Collection Time: 11/04/18  6:50 PM  Result Value Ref Range   FIO2 21.00    Delivery systems ROOM AIR    pH, Ven 7.442 (H) 7.250 - 7.430   pCO2, Ven 53.7 44.0 - 60.0 mmHg   pO2, Ven 35.8 32.0 - 45.0 mmHg   Bicarbonate 36.1 (H) 20.0 - 28.0 mmol/L   Acid-Base Excess 10.7 (H) 0.0 - 2.0 mmol/L   O2 Saturation 61.7 %   Patient temperature 98.6    Collection site VEIN    Drawn by COLLECTED BY NURSE    Sample type VENOUS     Comment: Performed at [facility] [address], [place], [place] [address]  POC occult blood, ED     Status: Abnormal   Collection Time: 11/04/18  8:46 PM  Result Value Ref Range   Fecal Occult Bld POSITIVE (A) NEGATIVE  Type and screen [place]     Status: None (Preliminary result)   Collection Time: 11/04/18 10:04 PM  Result Value Ref Range   ABO/RH(D) O POS    Antibody Screen NEG    Sample Expiration 11/07/2018,2359    Unit Number [id number]    Blood Component Type RED CELLS,LR    Unit division 00    Status of Unit ALLOCATED    Transfusion Status OK TO TRANSFUSE    Crossmatch Result      Compatible Performed at [facility], [place] [address], [place], [place] [address]   Prepare RBC     Status: None   Collection Time: 11/04/18 10:04 PM  Result Value Ref Range   Order Confirmation      ORDER PROCESSED BY BLOOD BANK Performed at [facility], [place] [address]  [place], [place] [address]   SARS  Coronavirus 2 (CEPHEID - Performed in [place] hospital lab), Hosp Order     Status: None   Collection Time: 11/04/18 11:05 PM   Specimen: Nasopharyngeal Swab  Result Value Ref Range   SARS Coronavirus 2 NEGATIVE NEGATIVE    Comment: (NOTE) If result is NEGATIVE SARS-CoV-2 target nucleic acids are NOT DETECTED. The SARS-CoV-2 RNA is generally detectable in upper and lower  respiratory specimens during the acute phase of infection. The lowest  concentration of SARS-CoV-2 viral copies this assay can detect is 250  copies / mL. A negative result does not preclude SARS-CoV-2 infection  and should not be used as the sole basis for treatment or other  patient management decisions.  A negative result may occur with  improper specimen collection / handling, submission of specimen other  than nasopharyngeal swab, presence of viral mutation(s) within the  areas targeted by this assay, and inadequate number of viral copies  (<250 copies / mL). A negative result must be combined with clinical  observations, patient history, and epidemiological information. If result is POSITIVE SARS-CoV-2 target nucleic acids are DETECTED. The SARS-CoV-2 RNA is generally detectable in upper and lower  respiratory specimens dur ing the acute phase of infection.  Positive  results are indicative of active infection with SARS-CoV-2.  Clinical  correlation with patient history and other diagnostic information is  necessary to determine patient infection status.  Positive results do  not rule out bacterial infection or co-infection with other viruses. If result is PRESUMPTIVE POSTIVE SARS-CoV-2 nucleic acids MAY BE PRESENT.   A presumptive positive result was obtained on the submitted specimen  and confirmed on repeat testing.  While 2019 novel coronavirus  (SARS-CoV-2) nucleic acids may be present in the submitted sample  additional confirmatory testing may be necessary for epidemiological  and / or clinical  management purposes  to differentiate between  SARS-CoV-2 and other Sarbecovirus currently known to infect humans.  If clinically indicated additional testing with an alternate test  methodology [phone]) is advised. The SARS-CoV-2 RNA is generally  detectable in upper and lower respiratory sp ecimens during the acute  phase of infection. The expected result is Negative. Fact Sheet for Patients:  StrictlyIdeas.no Fact Sheet for Healthcare Providers: BankingDealers.co.za This test is not yet approved or cleared by the [place] FDA and has been authorized for detection and/or diagnosis of SARS-CoV-2 by FDA under an Emergency Use Authorization (EUA).  This EUA will remain in effect (meaning this test can be used) for the duration of the COVID-19 declaration under Section 564(b)(1) of the Act, 21 U.S.C. section 360bbb-3(b)(1), unless the authorization is terminated or revoked sooner. Performed at [facility], [place] [address] [place], [place] [address]   Glucose, capillary     Status: Abnormal   Collection Time: 11/05/18 12:01 AM  Result Value Ref Range   Glucose-Capillary 460 (H) 70 - 99 mg/dL    Chemistries  Recent Labs  Lab 11/04/18 1845  NA 127*  K 4.4  CL 88*  CO2 33*  GLUCOSE 533*  BUN 25*  CREATININE 0.95  CALCIUM 7.8*  AST 31  ALT 14  ALKPHOS 144*  BILITOT 1.0   ------------------------------------------------------------------------------------------------------------------  ------------------------------------------------------------------------------------------------------------------ GFR: Estimated Creatinine Clearance: 82.6 mL/min (by C-G formula based on SCr of 0.95 mg/dL). Liver Function Tests: Recent Labs  Lab 11/04/18 1845  AST 31  ALT 14  ALKPHOS 144*  BILITOT 1.0  PROT 6.0*  ALBUMIN 1.6*  Recent Labs  Lab 11/04/18 1845  LIPASE 21   No results for input(s): AMMONIA in  the last 168 hours. Coagulation Profile: No results for input(s): INR, PROTIME in the last 168 hours. Cardiac Enzymes: No results for input(s): CKTOTAL, CKMB, CKMBINDEX, TROPONINI in the last 168 hours. BNP (last 3 results) No results for input(s): PROBNP in the last 8760 hours. HbA1C: No results for input(s): HGBA1C in the last 72 hours. CBG: Recent Labs  Lab 11/04/18 1837 11/05/18 0001  GLUCAP 498* 460*   Lipid Profile: No results for input(s): CHOL, HDL, LDLCALC, TRIG, CHOLHDL, LDLDIRECT in the last 72 hours. Thyroid Function Tests: No results for input(s): TSH, T4TOTAL, FREET4, T3FREE, THYROIDAB in the last 72 hours. Anemia Panel: No results for input(s): VITAMINB12, FOLATE, FERRITIN, TIBC, IRON, RETICCTPCT in the last 72 hours.  --------------------------------------------------------------------------------------------------------------- Urine analysis:    Component Value Date/Time   COLORURINE YELLOW 11/04/2018 1845   APPEARANCEUR CLEAR 11/04/2018 1845   APPEARANCEUR Clear 10/13/2012 2130   LABSPEC 1.025 11/04/2018 1845   LABSPEC 1.002 10/13/2012 2130   PHURINE 6.0 11/04/2018 1845   GLUCOSEU 150 (A) 11/04/2018 1845   GLUCOSEU >=500 10/13/2012 2130   HGBUR MODERATE (A) 11/04/2018 1845   BILIRUBINUR NEGATIVE 11/04/2018 1845   BILIRUBINUR Negative 10/13/2012 2130   KETONESUR NEGATIVE 11/04/2018 1845   PROTEINUR 100 (A) 11/04/2018 1845   UROBILINOGEN 0.2 11/06/2014 1910   NITRITE NEGATIVE 11/04/2018 1845   LEUKOCYTESUR NEGATIVE 11/04/2018 1845   LEUKOCYTESUR Negative 10/13/2012 2130      Imaging Results:    Ct Head Wo Contrast  Result Date: 11/04/2018 CLINICAL DATA:  63 year old male with altered mental status. EXAM: CT HEAD WITHOUT CONTRAST TECHNIQUE: Contiguous axial images were obtained from the base of the skull through the vertex without intravenous contrast. COMPARISON:  Head CT dated 04/27/2009 FINDINGS: Brain: Mild to moderate age-related atrophy and  chronic microvascular ischemic changes. There is no acute intracranial hemorrhage. No mass effect or midline shift. No extra-axial fluid collection. Vascular: No hyperdense vessel or unexpected calcification. Skull: Normal. Negative for fracture or focal lesion. Sinuses/Orbits: No acute finding. Other: None IMPRESSION: 1. No acute intracranial hemorrhage. 2. Age-related atrophy and chronic microvascular ischemic changes. Electronically Signed   By: Anner Crete M.D.   On: 11/04/2018 22:01      Assessment & Plan:    Principal Problem:   Symptomatic anemia Active Problems:   Diabetes mellitus (HCC)   Alcohol dependence (HCC)  AMS Check ammonia level Check b12, esr, ana, tsh, rpr Check MRI brain with and without contrast r/o metastatic hepatocellluar cancer  Anemia NPO except for medication Transfusion of 1 unit prbc ordered by ED, may need further transfusion Protonix 38m iv bid Per ER, (Dr. LVanita Panda, [facility] consulted and will be by in AM Check cbc after transfusion  Hep C Cirrhosis with esohageal varices protonix 451miv bid Cont Lasix 2049mo qday Cont Spironolactone 39m39m qday Cont Lactulose 30mL18mbid  H/o ETOH dep CIWA  Dm2 STOP Metformin STOP Lantus, since NPO  fsbs q4h, ISS  Neuropathy Use Neurontin 400mg 85mid  Microscopic hematuria Consider outpatient w/up  DVT Prophylaxis-   Lovenox - SCDs   AM Labs Ordered, also please review Full Orders  Family Communication: Admission, patients condition and plan of care including tests being ordered have been discussed with the patient  who indicate understanding and agree with the plan and Code Status.  Code Status:  FULL CODE  Admission status: Observation : Based on patients  clinical presentation and evaluation of above clinical data, I have made determination that patient meets observation criteria at this time.  Time spent in minutes : 70   Jani Gravel M.D on 11/05/2018 at 12:37 AM

## 2018-11-05 NOTE — Progress Notes (Signed)
Initial Nutrition Assessment  INTERVENTION:   Once diet advanced, provide Ensure Enlive po BID, each supplement provides 350 kcal and 20 grams of protein  NUTRITION DIAGNOSIS:   Increased nutrient needs related to chronic illness as evidenced by estimated needs.  GOAL:   Patient will meet greater than or equal to 90% of their needs  MONITOR:   Diet advancement, Labs, Weight trends, I & O's  REASON FOR ASSESSMENT:   Malnutrition Screening Tool    ASSESSMENT:   63 y.o. male,  w dm2 w neuropathy,  alcohol dep, Hep C cirrhosis w esophageal varices,  hepatocellular carcinoma, tobacco abuse, apparently was brought by EMS due to confusion and high blood sugar reading.  **RD working remotely**  Patient currently with AMS and lethargic. Pt is NPO. Pt may have a EGD tomorrow for GI bleed per GI note. Once diet is advanced, pt would benefit from protein supplements. Will monitor for plan.  Per weight records, pt has lost 40 lbs since 2/20 (20% wt loss x 4 months, significant for time frame).   Medications: Folic acid tablet daily, K-DUR tablet daily, Thiamine tablet daily Labs reviewed:  Low Na CBGs: 124  NUTRITION - FOCUSED PHYSICAL EXAM:  Unable to perform- working remotely.  Diet Order:   Diet Order            Diet NPO time specified Except for: Sips with Meds  Diet effective now              EDUCATION NEEDS:   No education needs have been identified at this time  Skin:  Skin Assessment: Reviewed RN Assessment  Last BM:  PTA  Height:   Ht Readings from Last 1 Encounters:  11/04/18 6' (1.829 m)    Weight:   Wt Readings from Last 1 Encounters:  11/05/18 70.2 kg    Ideal Body Weight:  80.9 kg  BMI:  Body mass index is 20.99 kg/m.  Estimated Nutritional Needs:   Kcal:  2100-2300  Protein:  100-110g  Fluid:  2L/day  Clayton Bibles, MS, RD, LDN [facility] Pager: [phone] After Hours Pager: [phone]

## 2018-11-05 NOTE — Consult Note (Addendum)
Referring Provider: Dr. Jani Gravel  Primary Care Physician:  Synthia Innocent Audrea Muscat, NP Primary Gastroenterologist:  Dr. Harl Bowie   Reason for Consultation: Etoh Cirrhosis, anemia, AMS   HPI: [name] is a 63 y.o. male with a past medical history of alcoholic cirrhosis, hepatitis C, hepatocellular carcinoma s/p thermal ablation x 2 08/2018, substance abuse, thrombocytopenia, hyponatremia, depression, neuropathy, diabetes mellitus type 2.  He was brought to the ED by EMS due to confusion and hyperglycemia. History was obtained from his chart as he is unable to provide any history at this time due to combined hepatic encephalopathy and sedated after Haldol given. No family at bed side.   ED course: Sodium 127.  Potassium 4.4.  Glucose 533.  BUN 25.  Creatinine 0.95.  Calcium 7.8.  Anion gap 6.  Alk phos 144.  Albumin 1.6.  Lipase 21.  AST 31.  ALT 14.  Total protein 6.  Total bili 1.0.  WBC 10.8.  Hemoglobin 6.4.  Hematocrit 19.8.  MCV 95.7.  Platelet 199.  Labs 6/28: Sodium 132.  Potassium 4.1.  Glucose 299.  BUN 22.  Creatinine 0.65.  Alk phos 134.  Albumin 1.6.  AST 33.  ALT 15.  Total protein 5.9.  Ammonia 37.  Total bili 1.2. Vitamin B12 815.  TSH 2.131.  Ethyl alcohol less than 10.  He received 1 unit of packed red blood cells which completed at 4 AM. Post-transfusion CBC:  WBC 12.3.  Hemoglobin 8.0.  Hematocrit 24.5.  Platelet 226.  CT of the head: No acute intracranial hemorrhage, age-related atrophy and chronic microvascular ischemic changes  GI History:  History of EToH + chronic hepatitis C decompensated cirrhosis and hepatocellular carcinoma. Multifocal hepatocellular carcinoma, atypical cells noted in peritoneal fluid on cytology from January 2020 Patient is status post thermal ablation of 2 lesions 09/06/2018. Hep C treated, he completed a 12 week course of Epclusa 04/2018 as prescribed by ID.   He was last seen in our office on 10/26/2018 by Nicoletta Ba PA-C with  progressive decompensated cirrhosis, at that time hospital admission was recommended, however, he declined.  A surveillance abdominal MRI was ordered, AFP, INR, Ammonia levels ordered. Meds included: Lactulose 30 cc p.o. twice daily Lasix at decreased dose of 20 mg once daily and Aldactone at decreased dose of 50 mg once daily Phenergan 12.5 mg every 6 hours PRN for nausea.  06/12/2018 EGD: Grade I esophageal varices. - LA Grade B reflux esophagitis. - Small hiatal hernia  06/12/2018 colonoscopy: One 1 mm polyp in the cecum, removed with a cold biopsy forceps. Resected and retrieved. - Non-bleeding internal hemorrhoids.  He was admitted to the hospital 05/31/2018 with decompensated cirrhosis, and abd pain.  06/01/2018 paracentesis: A total of approximately 700 mL of clear light yellow fluid was Removed. No evidence of SBP.  05/31/2018 abdominal MRI:  Hepatic cirrhosis. Two right hepatic lobe masses are definite hepatocellular carcinoma (LI-RADS 5), and a 3rd hypervascular lesion in the liver dome is probably hepatocellular carcinoma (LIRADS-4). Mild porta hepatis and retroperitoneal lymphadenopathy, suspicious for metastatic disease. Mild-to-moderate ascites.  05/31/2018 CTAP:  1. Hepatic cirrhosis. 2. Two hyperenhancing liver masses (2.3 cm in segment 8 right liver lobe and 2.0 cm in segment 6 right liver lobe) with features highly suspicious for multifocal hepatocellular carcinoma. MRI abdomen without and with IV contrast is indicated for further characterization. 3. Small to moderate volume ascites. Moderate esophageal varices. Mild anasarca. 4. Nonspecific mild diffuse gallbladder wall thickening without radiopaque cholelithiasis, potentially  due to noninflammatory edema. 5. Mild upper retroperitoneal adenopathy is nonspecific and could be reactive or metastatic. CT abdomen with IV contrast follow-up recommended in 3 months. 6. Right lower lobe 4 mm solid pulmonary nodule.  Dedicated chest CT recommended for further evaluation. 7. Mild prostatomegaly. 8.  Aortic Atherosclerosis    Past Medical History:  Diagnosis Date   Alcoholic ([place])    Alcoholic cirrhosis of liver ([place])    Pt calls this a false diagnosis. Pt denies   Alcoholism /alcohol abuse ([place])    Anxiety    Cancer ([place])    Chronic bronchitis ([place])    Chronic hyponatremia    pt denies having    Depression    Diabetes mellitus    Diabetes mellitus without complication ([place])    Hepatitis C    Hyperglycemia    Neuropathy    Substance abuse ([place])    Thrombocytopenia ([place])    Tobacco use 05/31/2018    Past Surgical History:  Procedure Laterality Date   BIOPSY  06/12/2018   Procedure: BIOPSY;  Surgeon: Mauri Pole, MD;  Location: [facility];  Service: Endoscopy;;   COLONOSCOPY WITH PROPOFOL N/A 06/12/2018   Procedure: COLONOSCOPY WITH PROPOFOL;  Surgeon: Mauri Pole, MD;  Location: [facility];  Service: Endoscopy;  Laterality: N/A;   ESOPHAGOGASTRODUODENOSCOPY (EGD) WITH PROPOFOL N/A 06/12/2018   Procedure: ESOPHAGOGASTRODUODENOSCOPY (EGD) WITH PROPOFOL;  Surgeon: Mauri Pole, MD;  Location: [facility];  Service: Endoscopy;  Laterality: N/A;   FRACTURE SURGERY  right lower leg plate placed years ago   IR RADIOLOGIST EVAL & MGMT  06/29/2018   Plate in lower right leg  2008   RADIOFREQUENCY ABLATION N/A 09/06/2018   Procedure: MICROWAVE THERMAL ABLATION;  Surgeon: Aletta Edouard, MD;  Location: [facility];  Service: Anesthesiology;  Laterality: N/A;    Prior to Admission medications   Medication Sig Start Date End Date Taking? Authorizing Provider  albuterol (PROVENTIL HFA;VENTOLIN HFA) 108 (90 Base) MCG/ACT inhaler Inhale 2 puffs into the lungs every 6 (six) hours as needed for wheezing or shortness of breath.    [provider]  furosemide (LASIX) 20 MG tablet Take 1 tablet (20 mg total) by mouth 2 (two) times daily in the am and at  bedtime.. 10/26/18   Esterwood, Amy S, PA-C  gabapentin (NEURONTIN) 400 MG capsule Take 4 capsules (1,600 mg total) by mouth 3 (three) times daily. 10/20/18   Ladell Pier, MD  insulin glargine (LANTUS) 100 UNIT/ML injection Inject 0.3 mLs (30 Units total) into the skin daily. Patient taking differently: Inject 30 Units into the skin daily after breakfast.  06/02/18   Mercy Riding, MD  Lactulose 20 GM/30ML SOLN 30 cc's p.o twice daily every day 10/26/18   Esterwood, Amy S, PA-C  metFORMIN (GLUCOPHAGE) 1000 MG tablet Take 1 tablet (1,000 mg total) by mouth 2 (two) times daily with a meal. 11/01/15   Patrecia Pour, NP  NONFORMULARY OR COMPOUNDED ITEM Bedside commode (over commode chair)    [place] 10/26/18   Esterwood, Amy S, PA-C  ondansetron (ZOFRAN) 8 MG tablet Take 1 tablet (8 mg total) by mouth every 8 (eight) hours as needed for nausea or vomiting. 10/20/18   Ladell Pier, MD  pantoprazole (PROTONIX) 40 MG tablet Take 1 tablet (40 mg total) by mouth every morning. 10/26/18   Esterwood, Amy S, PA-C  potassium chloride SA (K-DUR) 20 MEQ tablet Take 1 tablet (20 mEq total) by mouth daily. 10/20/18  Ladell Pier, MD  promethazine (PHENERGAN) 12.5 MG tablet Take 1 tablet (12.5 mg total) by mouth every 6 (six) hours as needed for nausea or vomiting. 10/26/18   Esterwood, Amy S, PA-C  spironolactone (ALDACTONE) 50 MG tablet Take 1 tablet (50 mg total) by mouth once for 1 dose. 10/26/18 10/26/18  Esterwood, Amy S, PA-C    Current Facility-Administered Medications  Medication Dose Route Frequency Provider Last Rate Last Dose   0.9 %  sodium chloride infusion  250 mL Intravenous PRN Jani Gravel, MD       albuterol (PROVENTIL) (2.5 MG/3ML) 0.083% nebulizer solution 3 mL  3 mL Inhalation Q6H PRN Jani Gravel, MD       feeding supplement (PRO-STAT SUGAR FREE 64) liquid 30 mL  30 mL Oral BID Jani Gravel, MD       folic acid (FOLVITE) tablet 1 mg  1 mg Oral Daily Jani Gravel, MD        furosemide (LASIX) tablet 20 mg  20 mg Oral Claudean Kinds, MD   20 mg at 11/05/18 0900   insulin aspart (novoLOG) injection 0-9 Units  0-9 Units Subcutaneous Q4H Jani Gravel, MD   5 Units at 11/05/18 0830   lactulose (CHRONULAC) 10 GM/15ML solution 20 g  20 g Oral BID Jani Gravel, MD   20 g at 11/05/18 1448   LORazepam (ATIVAN) injection 0-4 mg  0-4 mg Intravenous Q6H Jani Gravel, MD   Stopped at 11/05/18 0602   Followed by   Derrill Memo ON 11/07/2018] LORazepam (ATIVAN) injection 0-4 mg  0-4 mg Intravenous Q12H Jani Gravel, MD       LORazepam (ATIVAN) tablet 1 mg  1 mg Oral Q6H PRN Jani Gravel, MD   1 mg at 11/05/18 0136   Or   LORazepam (ATIVAN) injection 1 mg  1 mg Intravenous Q6H PRN Jani Gravel, MD       MEDLINE mouth rinse  15 mL Mouth Rinse BID Jani Gravel, MD   15 mL at 11/05/18 0133   morphine 2 MG/ML injection 2 mg  2 mg Intravenous Q3H PRN Vertis Kelch, NP   2 mg at 11/05/18 0045   multivitamin with minerals tablet 1 tablet  1 tablet Oral Daily Jani Gravel, MD       pantoprazole (PROTONIX) injection 40 mg  40 mg Intravenous Marjean Donna, MD   40 mg at 11/05/18 0400   [START ON 11/06/2018] pneumococcal 23 valent vaccine (PNU-IMMUNE) injection 0.5 mL  0.5 mL Intramuscular Tomorrow-1000 Jani Gravel, MD       potassium chloride SA (K-DUR) CR tablet 20 mEq  20 mEq Oral Daily Jani Gravel, MD       promethazine (PHENERGAN) tablet 12.5 mg  12.5 mg Oral Q6H PRN Alma Friendly, MD       sodium chloride flush (NS) 0.9 % injection 3 mL  3 mL Intravenous Q12H Jani Gravel, MD   3 mL at 11/05/18 0021   sodium chloride flush (NS) 0.9 % injection 3 mL  3 mL Intravenous PRN Jani Gravel, MD       spironolactone (ALDACTONE) tablet 50 mg  50 mg Oral Daily Alma Friendly, MD       thiamine (VITAMIN B-1) tablet 100 mg  100 mg Oral Daily Jani Gravel, MD       Or   thiamine (B-1) injection 100 mg  100 mg Intravenous Daily Jani Gravel, MD        Allergies as of 11/04/2018 -  Review Complete 11/04/2018  Allergen Reaction Noted   Benadryl [diphenhydramine hcl] Other (See Comments) 09/27/2012   Benadryl [diphenhydramine] Other (See Comments) 03/02/2015   Sulfa antibiotics Other (See Comments) 03/02/2015   Tegretol [carbamazepine] Other (See Comments) 11/20/2014   Librium [chlordiazepoxide hcl] Anxiety 12/20/2011   Librium [chlordiazepoxide] Anxiety 10/31/2015   Sulfa antibiotics Rash 11/05/2011    Family History  Problem Relation Age of Onset   ALS Father    Colon cancer Neg Hx    Colon polyps Neg Hx    Esophageal cancer Neg Hx    Rectal cancer Neg Hx    Stomach cancer Neg Hx     Social History   Socioeconomic History   Marital status: Divorced    Spouse name: Not on file   Number of children: Not on file   Years of education: Not on file   Highest education level: Not on file  Occupational History   Not on file  Social Needs   Financial resource strain: Not on file   Food insecurity    Worry: Not on file    Inability: Not on file   Transportation needs    Medical: Not on file    Non-medical: Not on file  Tobacco Use   Smoking status: Current Every Day Smoker    Packs/day: 0.50    Years: 40.00    Pack years: 20.00    Types: Cigarettes   Smokeless tobacco: Never Used  Substance and Sexual Activity   Alcohol use: Not Currently    Comment: 18/daily- reports has not drink in 2 1/2years   Drug use: Yes    Frequency: 3.0 times per week    Types: Marijuana   Sexual activity: Not Currently  Lifestyle   Physical activity    Days per week: Not on file    Minutes per session: Not on file   Stress: Not on file  Relationships   Social connections    Talks on phone: Not on file    Gets together: Not on file    Attends religious service: Not on file    Active member of club or organization: Not on file    Attends meetings of clubs or organizations: Not on file    Relationship status: Not on file   Intimate  partner violence    Fear of current or ex partner: Not on file    Emotionally abused: Not on file    Physically abused: Not on file    Forced sexual activity: Not on file  Other Topics Concern   Not on file  Social History Narrative   ** Merged History Encounter **        Review of Systems: ROS not completed as patient is encephalopathic and sedated unable to provide history.   Physical Exam: Vital signs in last 24 hours: Temp:  [98.2 F (36.8 C)-98.7 F (37.1 C)] 98.4 F (36.9 C) (06/28 0557) Pulse Rate:  [83-93] 83 (06/28 0557) Resp:  [12-16] 16 (06/28 0557) BP: (129-156)/(60-83) 147/65 (06/28 0557) SpO2:  [91 %-98 %] 93 % (06/28 0557) Weight:  [70.2 kg-71.5 kg] 70.2 kg (06/28 0557)   General: ill appearing male, difficult to arouse, opens eyes when name called out loudly, follows few commands.  Head:  Normocephalic and atraumatic. Eyes:  Sclera clear, no icterus.Conjunctiva pink. Ears:  Normal auditory acuity. Nose:  No deformity, discharge or lesions. Mouth: Poor dentition.   Neck:  Supple. Lungs:  Clear throughout lung fields.  Heart:  RRR, no  murmurs.  Abdomen:  Protuberant, suspect small amount of ascites, mid lower abdomen firm, mild tenderness to the epigastric area provoked on exam, patient awakened to pain, when asked if he had pain he responded "a little bit" then eyes closed again. Rectal:  Deferred  Msk:  Symmetrical without gross deformities. . Pulses:  Normal pulses noted. Extremities:  Without clubbing or edema. Neurologic: PERRLA. Awakens with effort, follows few simple commands, squeezes hands on command, unable to perform asterixis exam. No obvious tremors.  Skin:  Intact without significant lesions or rashes.. Psych: Resting quietly at this time.  Intake/Output from previous day: 06/27 0701 - 06/28 0700 In: 338.2 [I.V.:23.2; Blood:315] Out: 150 [Urine:150] Intake/Output this shift: No intake/output data recorded.  Lab Results: Recent Labs     11/04/18 1845  WBC 10.8*  HGB 6.4*  HCT 19.8*  PLT 199   BMET Recent Labs    11/04/18 1845 11/05/18 0621  NA 127* 132*  K 4.4 4.1  CL 88* 94*  CO2 33* 31  GLUCOSE 533* 299*  BUN 25* 22  CREATININE 0.95 0.65  CALCIUM 7.8* 7.7*   LFT Recent Labs    11/05/18 0621  PROT 5.9*  ALBUMIN 1.6*  AST 33  ALT 15  ALKPHOS 134*  BILITOT 1.2    Studies/Results: Ct Head Wo Contrast  Result Date: 11/04/2018 CLINICAL DATA:  63 year old male with altered mental status. EXAM: CT HEAD WITHOUT CONTRAST TECHNIQUE: Contiguous axial images were obtained from the base of the skull through the vertex without intravenous contrast. COMPARISON:  Head CT dated 04/27/2009 FINDINGS: Brain: Mild to moderate age-related atrophy and chronic microvascular ischemic changes. There is no acute intracranial hemorrhage. No mass effect or midline shift. No extra-axial fluid collection. Vascular: No hyperdense vessel or unexpected calcification. Skull: Normal. Negative for fracture or focal lesion. Sinuses/Orbits: No acute finding. Other: None IMPRESSION: 1. No acute intracranial hemorrhage. 2. Age-related atrophy and chronic microvascular ischemic changes. Electronically Signed   By: Anner Crete M.D.   On: 11/04/2018 22:01    IMPRESSION/PLAN: 43. 63 year old male with decompensated cirrhosis secondary to EtOH abuse and chronic hepatitis C, hepatocellular carcinoma presents to the hospital with AMS and hyperglycemia.  -PT INR (not done on admission) -Abdominal sonogram to assess for ascites -Hold Diuretics for now  2. Anemia. Admission Hg 6.4 up to 8.0 transfused 1 unit PRBCs.  No obvious signs of GI bleeding. He has small EV per EGD 2/20. FOBT +.  -possible EGD tomorrow to assess for EV bleeding, colonoscopy unlikely as he would not be able to complete bowel prep with AMS.  -will start Rocephin 1gm IV Q 24 hrs prophylaxis with EV -consider Octreotide, further recommendation per Dr. Lyndel Safe. -PPI IV bid  3.  Hepatic Encephalopathy. He is difficult to arouse, received Haldol earlier today. On Lactulose. Concerning for progressive hepatic encephalopathy at risk for coma.  -continue po Lactulose only if patient is awake and safely can take po, suspect he will require Lactulose enemas. -Xifaxan 59m po bid -will need transfer to ICU if he becomes obtunded, intubated for airway protection  4. Leukocytosis. He is afebrile.  -low volume paracentesis if enough ascites found on abd sono, rule out SBP -monitor temp and CBC closely  5. DM II  Further recommendations per Dr. GClaudina LickMDorathy Daft 11/05/2018, 10:07 AM     Attending physician's note   I have reviewed the chart and examined the patient.  History not obtainable from the patient as he was very sleepy  although arousable. I agree with the Advanced Practitioner's note, impression and recommendations.    Decompensated Child's class C liver cirrhosis d/t ETOH/hep C with HCC (s/p ablation x 2 08/2018, AFP 3.5 10/2018), ascites, hypersplenism and HE, admitted with anemia with heme positive stools. Hb 6.4 S/P 1U to 8.  No active GI bleeding. EGD 06/2018 showed grade 1 esophageal varices, reflux esophagitis and small hiatal hernia. Neg colon 06/2018.  Patient very sleepy although arousable after a dose of Haldol. Has underlying hepatic encephalopathy.  Has uncontrolled diabetes.  Plan: -IV Protonix. -IV Rocephin. -Hold off on IV octreotide since he had minimal varices during last EGD and there is no active bleeding. -Lactulose and rifaximin 530m po bid once able to take p.o. -Resume Aldactone and Lasix once able to take p.o. -Social work consult (lives alone, not sure if he is taking his meds). -[place]. -Trend CBC, PT INR and electrolytes. -May need EGD near discharge. -Dr. DLoletha Carrowtaking over [facility] tomorrow.   RCarmell Austria MD LVelora HecklerGI [phone]

## 2018-11-05 NOTE — Progress Notes (Addendum)
PROGRESS NOTE  [name] OEV:[id number] DOB: Mar 17, 1956 DOA: 11/04/2018 PCP: [name] Coots, NP  HPI/Recap of past 24 hours: HPI from Dr [name]  is a 63 y.o. male,  w dm2 w neuropathy,  alcohol dep, Hep C cirrhosis w esophageal varices,  hepatocellular carcinoma, tobacco abuse, apparently was brought by EMS due to confusion and high blood sugar reading.  Patient was unable to give a good history.  In the ED, patient vital signs were fairly stable, CT brain with no acute intracranial hemorrhage.  Patient's glucose was noted to be 533.  COVID-19 test done was negative.  Prior to leaving the ED, became more alert, was able to carry out conversations.  Patient admitted for further management.  EDP consulted GI.    Today, patient noted to be very lethargic/sleepy, opens eyes when name called, was given 5 mg of Haldol at about 3 AM this morning for agitation.  Unable to perform ROS.  Assessment/Plan: Principal Problem:   Symptomatic anemia Active Problems:   Diabetes mellitus (HCC)   Alcohol dependence (HCC)   Altered mental status   Hepatic encephalopathy Likely due to decompensated liver cirrhosis 2/2 alcohol abuse/hepatitis C/HCC, R/O SBP Vs bleeding esophageal varices Currently stable lethargic/sleepy (although was given Haldol around 3 AM) Afebrile, with leukocytosis Ammonia mildly elevated, INR 1.2 BC x2 pending collection Portable chest x-ray with possible infiltrates LA 1.9-->1.4 CT head no acute intracranial hemorrhage Abdominal USS showed small ascites Continue lactulose, may need enemas Start rifaximin once able to tolerate Re-start diuretics once able to tolerate GI on board, appreciate recs NPO, Monitor very closely, continue telemetry  Normocytic anemia, r/o GI bleed Hemoglobin on admission 6.4, transfused 1UPRBC---> 8 Rule out esophageal varices bleed, although no signs of GI bleeding Vitamin B12 815 Last EGD 2/20 showed small esophageal  varices GI on board: May repeat another EGD Switched IV rocephin to IV Zosyn (to cover for HCAP) Continue PPI   HHS/hyperglycemia with diabetes mellitus type 2 BS on admission >500, no gap, acidosis, no ketones S/P IVF SSI, Accu-Cheks, hypoglycemic protocol Hold home metformin, Lantus as pt is NPO  ?HCAP Chest x-ray showing increased density at the left base which may represent an early infiltrate We will change to IV Zosyn  Hyponatremia Improving Likely due to above, hyperglycemia  Hx of chronic Hep C/cirrhosis/HCC Follows with GI Completed treatment for Hep C with a 12-week course of Epclusa in 04/2018 Noted to have multifocal hepatocellular carcinoma, atypical cells noted in peritoneal fluid on cytology in 05/2018.  Patient is status post thermal ablation of the 2 lesions in 09/06/2018 GI on board  History of alcohol abuse CIWA protocol         Malnutrition Type:      Malnutrition Characteristics:      Nutrition Interventions:       Estimated body mass index is 20.99 kg/m as calculated from the following:   Height as of this encounter: 6' (1.829 m).   Weight as of this encounter: 70.2 kg.     Code Status: Full  Family Communication: None at bedside  Disposition Plan: To be determined   Consultants:  GI  Procedures:  None  Antimicrobials:  IV ceftriaxone  DVT prophylaxis: SCDs   Objective: Vitals:   11/05/18 0036 11/05/18 0104 11/05/18 0411 11/05/18 0557  BP: (!) 151/60 (!) 147/83 136/69 (!) 147/65  Pulse: 89 83 83 83  Resp: _0 Temp: 98.3 F (36.8 C) 98.5 F (36.9 C)  98.7 F (37.1 C) 98.4 F (36.9 C)  TempSrc: Oral Oral Oral Oral  SpO2: 98% 97% 95% 93%  Weight:    70.2 kg  Height:        Intake/Output Summary (Last 24 hours) at 11/05/2018 1142 Last data filed at 11/05/2018 0558 Gross per 24 hour  Intake 338.17 ml  Output 150 ml  Net 188.17 ml   Filed Weights   11/04/18 2353 11/05/18 0557  Weight: 71.5 kg  70.2 kg    Exam:  General:  Lethargic/sleepy, open eyes when name called out  Cardiovascular: S1, S2 present  Respiratory: CTAB  Abdomen: Soft, firm/protuberant, +tender in epigastric region, bowel sounds present  Musculoskeletal: No bilateral pedal edema noted  Skin: Normal  Psychiatry: Normal mood   Data Reviewed: CBC: Recent Labs  Lab 11/04/18 1845 11/05/18 1035  WBC 10.8* 12.3*  NEUTROABS 7.6  --   HGB 6.4* 8.0*  HCT 19.8* 24.5*  MCV 95.7 95.7  PLT 199 767   Basic Metabolic Panel: Recent Labs  Lab 11/04/18 1845 11/05/18 0621  NA 127* 132*  K 4.4 4.1  CL 88* 94*  CO2 33* 31  GLUCOSE 533* 299*  BUN 25* 22  CREATININE 0.95 0.65  CALCIUM 7.8* 7.7*   GFR: Estimated Creatinine Clearance: 96.3 mL/min (by C-G formula based on SCr of 0.65 mg/dL). Liver Function Tests: Recent Labs  Lab 11/04/18 1845 11/05/18 0621  AST 31 33  ALT 14 15  ALKPHOS 144* 134*  BILITOT 1.0 1.2  PROT 6.0* 5.9*  ALBUMIN 1.6* 1.6*   Recent Labs  Lab 11/04/18 1845  LIPASE 21   Recent Labs  Lab 11/05/18 0621  AMMONIA 37*   Coagulation Profile: No results for input(s): INR, PROTIME in the last 168 hours. Cardiac Enzymes: No results for input(s): CKTOTAL, CKMB, CKMBINDEX, TROPONINI in the last 168 hours. BNP (last 3 results) No results for input(s): PROBNP in the last 8760 hours. HbA1C: No results for input(s): HGBA1C in the last 72 hours. CBG: Recent Labs  Lab 11/04/18 1837 11/05/18 0001 11/05/18 0600 11/05/18 0731  GLUCAP 498* 460* 321* 252*   Lipid Profile: No results for input(s): CHOL, HDL, LDLCALC, TRIG, CHOLHDL, LDLDIRECT in the last 72 hours. Thyroid Function Tests: Recent Labs    11/05/18 0621  TSH 2.131   Anemia Panel: Recent Labs    11/05/18 0621  VITAMINB12 815   Urine analysis:    Component Value Date/Time   COLORURINE YELLOW 11/04/2018 1845   APPEARANCEUR CLEAR 11/04/2018 1845   APPEARANCEUR Clear 10/13/2012 2130   LABSPEC 1.025  11/04/2018 1845   LABSPEC 1.002 10/13/2012 2130   PHURINE 6.0 11/04/2018 1845   GLUCOSEU 150 (A) 11/04/2018 1845   GLUCOSEU >=500 10/13/2012 2130   HGBUR MODERATE (A) 11/04/2018 1845   BILIRUBINUR NEGATIVE 11/04/2018 1845   BILIRUBINUR Negative 10/13/2012 2130   KETONESUR NEGATIVE 11/04/2018 1845   PROTEINUR 100 (A) 11/04/2018 1845   UROBILINOGEN 0.2 11/06/2014 1910   NITRITE NEGATIVE 11/04/2018 1845   LEUKOCYTESUR NEGATIVE 11/04/2018 1845   LEUKOCYTESUR Negative 10/13/2012 2130   Sepsis Labs: _0 (procalcitonin:4,lacticidven:4)  ) Recent Results (from the past 240 hour(s))  SARS Coronavirus 2 (CEPHEID - Performed in [place] hospital lab), Hosp Order     Status: None   Collection Time: 11/04/18 11:05 PM   Specimen: Nasopharyngeal Swab  Result Value Ref Range Status   SARS Coronavirus 2 NEGATIVE NEGATIVE Final    Comment: (NOTE) If result is NEGATIVE SARS-CoV-2 target nucleic acids are NOT DETECTED.  The SARS-CoV-2 RNA is generally detectable in upper and lower  respiratory specimens during the acute phase of infection. The lowest  concentration of SARS-CoV-2 viral copies this assay can detect is 250  copies / mL. A negative result does not preclude SARS-CoV-2 infection  and should not be used as the sole basis for treatment or other  patient management decisions.  A negative result may occur with  improper specimen collection / handling, submission of specimen other  than nasopharyngeal swab, presence of viral mutation(s) within the  areas targeted by this assay, and inadequate number of viral copies  (<250 copies / mL). A negative result must be combined with clinical  observations, patient history, and epidemiological information. If result is POSITIVE SARS-CoV-2 target nucleic acids are DETECTED. The SARS-CoV-2 RNA is generally detectable in upper and lower  respiratory specimens dur ing the acute phase of infection.  Positive  results are indicative of active  infection with SARS-CoV-2.  Clinical  correlation with patient history and other diagnostic information is  necessary to determine patient infection status.  Positive results do  not rule out bacterial infection or co-infection with other viruses. If result is PRESUMPTIVE POSTIVE SARS-CoV-2 nucleic acids MAY BE PRESENT.   A presumptive positive result was obtained on the submitted specimen  and confirmed on repeat testing.  While 2019 novel coronavirus  (SARS-CoV-2) nucleic acids may be present in the submitted sample  additional confirmatory testing may be necessary for epidemiological  and / or clinical management purposes  to differentiate between  SARS-CoV-2 and other Sarbecovirus currently known to infect humans.  If clinically indicated additional testing with an alternate test  methodology [phone]) is advised. The SARS-CoV-2 RNA is generally  detectable in upper and lower respiratory sp ecimens during the acute  phase of infection. The expected result is Negative. Fact Sheet for Patients:  StrictlyIdeas.no Fact Sheet for Healthcare Providers: BankingDealers.co.za This test is not yet approved or cleared by the [place] FDA and has been authorized for detection and/or diagnosis of SARS-CoV-2 by FDA under an Emergency Use Authorization (EUA).  This EUA will remain in effect (meaning this test can be used) for the duration of the COVID-19 declaration under Section 564(b)(1) of the Act, 21 U.S.C. section 360bbb-3(b)(1), unless the authorization is terminated or revoked sooner. Performed at [facility], [place] [address], [place], [place] [address]   MRSA PCR Screening     Status: None   Collection Time: 11/05/18 12:04 AM   Specimen: Nasal Mucosa; Nasopharyngeal  Result Value Ref Range Status   MRSA by PCR NEGATIVE NEGATIVE Final    Comment:        The GeneXpert MRSA Assay (FDA approved for NASAL specimens  only), is one component of a comprehensive MRSA colonization surveillance program. It is not intended to diagnose MRSA infection nor to guide or monitor treatment for MRSA infections. Performed at [facility], [place] [address] [place], [place] [address]       Studies: Ct Head Wo Contrast  Result Date: 11/04/2018 CLINICAL DATA:  63 year old male with altered mental status. EXAM: CT HEAD WITHOUT CONTRAST TECHNIQUE: Contiguous axial images were obtained from the base of the skull through the vertex without intravenous contrast. COMPARISON:  Head CT dated 04/27/2009 FINDINGS: Brain: Mild to moderate age-related atrophy and chronic microvascular ischemic changes. There is no acute intracranial hemorrhage. No mass effect or midline shift. No extra-axial fluid collection. Vascular: No hyperdense vessel or unexpected calcification. Skull: Normal. Negative for  fracture or focal lesion. Sinuses/Orbits: No acute finding. Other: None IMPRESSION: 1. No acute intracranial hemorrhage. 2. Age-related atrophy and chronic microvascular ischemic changes. Electronically Signed   By: Anner Crete M.D.   On: 11/04/2018 22:01    Scheduled Meds: . feeding supplement (PRO-STAT SUGAR FREE 64)  30 mL Oral BID  . folic acid  1 mg Oral Daily  . furosemide  20 mg Oral BH-qamhs  . insulin aspart  0-9 Units Subcutaneous Q4H  . lactulose  20 g Oral BID  . LORazepam  0-4 mg Intravenous Q6H   Followed by  . [START ON 11/07/2018] LORazepam  0-4 mg Intravenous Q12H  . mouth rinse  15 mL Mouth Rinse BID  . multivitamin with minerals  1 tablet Oral Daily  . pantoprazole (PROTONIX) IV  40 mg Intravenous Q12H  . [START ON 11/06/2018] pneumococcal 23 valent vaccine  0.5 mL Intramuscular Tomorrow-1000  . potassium chloride SA  20 mEq Oral Daily  . sodium chloride flush  3 mL Intravenous Q12H  . spironolactone  50 mg Oral Daily  . thiamine  100 mg Oral Daily   Or  . thiamine  100 mg Intravenous Daily     Continuous Infusions: . sodium chloride       LOS: 0 days     Alma Friendly, MD [facility]  If 7PM-7AM, please contact night-coverage [email or web address] 11/05/2018, 11:42 AM

## 2018-11-06 ENCOUNTER — Inpatient Hospital Stay (HOSPITAL_COMMUNITY): Payer: Medicaid Other

## 2018-11-06 DIAGNOSIS — E43 Unspecified severe protein-calorie malnutrition: Secondary | ICD-10-CM

## 2018-11-06 DIAGNOSIS — R945 Abnormal results of liver function studies: Secondary | ICD-10-CM

## 2018-11-06 DIAGNOSIS — F10230 Alcohol dependence with withdrawal, uncomplicated: Secondary | ICD-10-CM

## 2018-11-06 DIAGNOSIS — R410 Disorientation, unspecified: Secondary | ICD-10-CM

## 2018-11-06 DIAGNOSIS — K7031 Alcoholic cirrhosis of liver with ascites: Secondary | ICD-10-CM

## 2018-11-06 LAB — CBC WITH DIFFERENTIAL/PLATELET
Abs Immature Granulocytes: 0.02 10*3/uL (ref 0.00–0.07)
Basophils Absolute: 0 10*3/uL (ref 0.0–0.1)
Basophils Relative: 0 %
Eosinophils Absolute: 0.2 10*3/uL (ref 0.0–0.5)
Eosinophils Relative: 3 %
HCT: 25.7 % — ABNORMAL LOW (ref 39.0–52.0)
Hemoglobin: 8.1 g/dL — ABNORMAL LOW (ref 13.0–17.0)
Immature Granulocytes: 0 %
Lymphocytes Relative: 27 %
Lymphs Abs: 2.2 10*3/uL (ref 0.7–4.0)
MCH: 31.5 pg (ref 26.0–34.0)
MCHC: 31.5 g/dL (ref 30.0–36.0)
MCV: 100 fL (ref 80.0–100.0)
Monocytes Absolute: 0.7 10*3/uL (ref 0.1–1.0)
Monocytes Relative: 8 %
Neutro Abs: 5.1 10*3/uL (ref 1.7–7.7)
Neutrophils Relative %: 62 %
Platelets: UNDETERMINED 10*3/uL (ref 150–400)
RBC: 2.57 MIL/uL — ABNORMAL LOW (ref 4.22–5.81)
RDW: 18.7 % — ABNORMAL HIGH (ref 11.5–15.5)
WBC: 8.3 10*3/uL (ref 4.0–10.5)
nRBC: 0 % (ref 0.0–0.2)

## 2018-11-06 LAB — BPAM RBC
Blood Product Expiration Date: 202007242359
ISSUE DATE / TIME: 202006280043
Unit Type and Rh: 5100

## 2018-11-06 LAB — GLUCOSE, CAPILLARY
Glucose-Capillary: 128 mg/dL — ABNORMAL HIGH (ref 70–99)
Glucose-Capillary: 129 mg/dL — ABNORMAL HIGH (ref 70–99)
Glucose-Capillary: 131 mg/dL — ABNORMAL HIGH (ref 70–99)
Glucose-Capillary: 135 mg/dL — ABNORMAL HIGH (ref 70–99)
Glucose-Capillary: 152 mg/dL — ABNORMAL HIGH (ref 70–99)
Glucose-Capillary: 160 mg/dL — ABNORMAL HIGH (ref 70–99)

## 2018-11-06 LAB — TYPE AND SCREEN
ABO/RH(D): O POS
Antibody Screen: NEGATIVE
Unit division: 0

## 2018-11-06 LAB — PROTIME-INR
INR: 1.2 (ref 0.8–1.2)
Prothrombin Time: 15.4 seconds — ABNORMAL HIGH (ref 11.4–15.2)

## 2018-11-06 LAB — ANA: Anti Nuclear Antibody (ANA): NEGATIVE

## 2018-11-06 MED ORDER — THIAMINE HCL 100 MG/ML IJ SOLN: 500.0000 mg | Freq: Three times a day (TID) | INTRAVENOUS | Status: DC

## 2018-11-06 MED ORDER — THIAMINE HCL 100 MG/ML IJ SOLN
500.0000 mg | Freq: Three times a day (TID) | INTRAVENOUS | Status: DC
  Administered 2018-11-06 – 2018-11-08 (×7): 500 mg via INTRAVENOUS
  Filled 2018-11-06 (×10): qty 5

## 2018-11-06 MED ORDER — THIAMINE HCL 100 MG/ML IJ SOLN
250.0000 mg | Freq: Every day | INTRAVENOUS | Status: DC
  Administered 2018-11-09: 09:00:00 250 mg via INTRAVENOUS
  Filled 2018-11-06: qty 2.5

## 2018-11-06 MED ORDER — LACTULOSE ENEMA
300.0000 mL | Freq: Four times a day (QID) | ORAL | Status: DC
  Administered 2018-11-06: 300 mL via RECTAL
  Filled 2018-11-06 (×6): qty 300

## 2018-11-06 MED ORDER — LORAZEPAM 2 MG/ML IJ SOLN
1.0000 mg | Freq: Once | INTRAMUSCULAR | Status: AC
  Administered 2018-11-06: 1 mg via INTRAVENOUS

## 2018-11-06 MED ORDER — PROMETHAZINE HCL 25 MG/ML IJ SOLN
12.5000 mg | Freq: Three times a day (TID) | INTRAMUSCULAR | Status: DC | PRN
  Administered 2018-11-09: 12.5 mg via INTRAVENOUS
  Filled 2018-11-06: qty 1

## 2018-11-06 NOTE — Progress Notes (Signed)
Patient ID: [name], male   DOB: 1955/12/27, 63 y.o.   MRN: [id number] Pt presented to [place] dept today for paracentesis. On limited US abd in all four quadrants there is only a trace amount of ascites noted and not enough to safely aspirate at this time due to proximity to bowel. Procedure cancelled.

## 2018-11-06 NOTE — Progress Notes (Signed)
Patient unavailable due to multiple procedures. Notified RN that we will try again tomorrow.

## 2018-11-06 NOTE — Progress Notes (Signed)
Spoke to [employer], pt pulled IV out and is waiting for IV team to place another IV. Explained that we are only here until 10p and if IV isn't placed before that patient will be done tomorrow.

## 2018-11-06 NOTE — Progress Notes (Signed)
Patient was given IV ativan prior to attempting MRI. We slid patient over to the MRI table and he began yelling and became combative. Patient was moved back onto his bed and Gerald Stabs took patient back upstairs. Pt may need to be done under General Anesthesia if MRI needs to be done.  Spoke with Journalist, newspaper.

## 2018-11-06 NOTE — Progress Notes (Signed)
PROGRESS NOTE  [name] ASN:[id number] DOB: 1956-03-20 DOA: 11/04/2018 PCP: Marliss Coots, NP  HPI/Recap of past 24 hours: HPI from Dr Ardeen Fillers [place]  is a 63 y.o. male,  w dm2 w neuropathy,  alcohol dep, Hep C cirrhosis w esophageal varices,  hepatocellular carcinoma, tobacco abuse, apparently was brought by EMS due to confusion and high blood sugar reading.  Patient was unable to give a good history.  In the ED, patient vital signs were fairly stable, CT brain with no acute intracranial hemorrhage.  Patient's glucose was noted to be 533.  COVID-19 test done was negative.  Prior to leaving the ED, became more alert, was able to carry out conversations.  Patient admitted for further management.  EDP consulted GI.    Today, patient more alert, but confused and intermittently agitated.  Assessment/Plan: Principal Problem:   Symptomatic anemia Active Problems:   Diabetes mellitus (HCC)   Alcohol dependence (HCC)   Altered mental status   Acute Hepatic encephalopathy Likely due to decompensated liver cirrhosis 2/2 alcohol abuse/hepatitis C/HCC, R/O SBP Vs bleeding esophageal varices Vs Wernicke encephalopathy Afebrile, with resolved leukocytosis Ammonia mildly elevated, INR 1.2 BC x2 NGTD Portable chest x-ray with possible infiltrates LA 1.9-->1.4 CT head no acute intracranial hemorrhage Abdominal USS showed small ascites MRI brain pending EEG pending Continue lactulose enemas Treat for Wernicke's encephalopathy with high-dose IV thiamine Start rifaximin once able to tolerate Re-start diuretics once able to tolerate IV Zosyn (??SBP, ?bleeding esophageal varices) GI on board, appreciate recs NPO, Monitor very closely, continue telemetry  HCAP Afebrile with resolved leukocytosis Chest x-ray showing increased density at the left base which may represent an early infiltrate Continue IV Zosyn  Normocytic anemia, r/o GI bleed Hemoglobin on admission 6.4,  transfused 1UPRBC---> 8 Rule out esophageal varices bleed, although no signs of GI bleeding Vitamin B12-->815 Last EGD 2/20 showed small esophageal varices GI on board: May repeat another EGD once stable Continue IV Zosyn Continue PPI   HHS/hyperglycemia with diabetes mellitus type 2 BS on admission >500, no gap, acidosis, no ketones S/P IVF SSI, Accu-Cheks, hypoglycemic protocol Hold home metformin, Lantus as pt is NPO  Hyponatremia Improving Likely due to above, hyperglycemia  Hx of chronic Hep C/cirrhosis/HCC Follows with GI Completed treatment for Hep C with a 12-week course of Epclusa in 04/2018 Noted to have multifocal hepatocellular carcinoma, atypical cells noted in peritoneal fluid on cytology in 05/2018.  Patient is status post thermal ablation of the 2 lesions in 09/06/2018 GI on board  History of alcohol abuse CIWA protocol         Malnutrition Type:  Nutrition Problem: Increased nutrient needs Etiology: chronic illness   Malnutrition Characteristics:  Signs/Symptoms: estimated needs   Nutrition Interventions:  Interventions: Refer to RD note for recommendations    Estimated body mass index is 19.29 kg/m as calculated from the following:   Height as of this encounter: 6' (1.829 m).   Weight as of this encounter: 64.5 kg.     Code Status: Full  Family Communication: None at bedside  Disposition Plan: To be determined   Consultants:  GI  Procedures:  None  Antimicrobials:  IV Zosyn  DVT prophylaxis: SCDs   Objective: Vitals:   11/05/18 1455 11/05/18 2128 11/06/18 0500 11/06/18 1345  BP: 134/76 (!) 143/81 138/77 (!) 156/101  Pulse: 87 86 92 91  Resp: _0 Temp: 99.3 F (37.4 C) 98.9 F (37.2 C) 98.4 F (36.9 C) 97.8  F (36.6 C)  TempSrc: Axillary Oral Oral Oral  SpO2: 96% 96% 98%   Weight:   64.5 kg   Height:        Intake/Output Summary (Last 24 hours) at 11/06/2018 1423 Last data filed at 11/06/2018 0900  Gross per 24 hour  Intake 154.94 ml  Output 2075 ml  Net -1920.06 ml   Filed Weights   11/04/18 2353 11/05/18 0557 11/06/18 0500  Weight: 71.5 kg 70.2 kg 64.5 kg    Exam:  General:  Confused, lethargic, intermittent agitation  Cardiovascular: S1, S2 present  Respiratory: CTAB  Abdomen: Soft, +tender, protuberant, bowel sounds present  Musculoskeletal: No bilateral pedal edema noted  Skin: Normal  Psychiatry:  Confused   Data Reviewed: CBC: Recent Labs  Lab 11/04/18 1845 11/05/18 1035 11/06/18 0818  WBC 10.8* 12.3* 8.3  NEUTROABS 7.6  --  5.1  HGB 6.4* 8.0* 8.1*  HCT 19.8* 24.5* 25.7*  MCV 95.7 95.7 100.0  PLT 199 226 PLATELET CLUMPS NOTED ON SMEAR, UNABLE TO ESTIMATE   Basic Metabolic Panel: Recent Labs  Lab 11/04/18 1845 11/05/18 0621  NA 127* 132*  K 4.4 4.1  CL 88* 94*  CO2 33* 31  GLUCOSE 533* 299*  BUN 25* 22  CREATININE 0.95 0.65  CALCIUM 7.8* 7.7*   GFR: Estimated Creatinine Clearance: 88.5 mL/min (by C-G formula based on SCr of 0.65 mg/dL). Liver Function Tests: Recent Labs  Lab 11/04/18 1845 11/05/18 0621  AST 31 33  ALT 14 15  ALKPHOS 144* 134*  BILITOT 1.0 1.2  PROT 6.0* 5.9*  ALBUMIN 1.6* 1.6*   Recent Labs  Lab 11/04/18 1845  LIPASE 21   Recent Labs  Lab 11/05/18 0621  AMMONIA 37*   Coagulation Profile: Recent Labs  Lab 11/05/18 1209 11/06/18 0818  INR 1.2 1.2   Cardiac Enzymes: No results for input(s): CKTOTAL, CKMB, CKMBINDEX, TROPONINI in the last 168 hours. BNP (last 3 results) No results for input(s): PROBNP in the last 8760 hours. HbA1C: No results for input(s): HGBA1C in the last 72 hours. CBG: Recent Labs  Lab 11/05/18 1952 11/05/18 2333 11/06/18 0348 11/06/18 0748 11/06/18 1141  GLUCAP 153* 134* 128* 131* 135*   Lipid Profile: No results for input(s): CHOL, HDL, LDLCALC, TRIG, CHOLHDL, LDLDIRECT in the last 72 hours. Thyroid Function Tests: Recent Labs    11/05/18 0621  TSH 2.131    Anemia Panel: Recent Labs    11/05/18 0621  VITAMINB12 815   Urine analysis:    Component Value Date/Time   COLORURINE YELLOW 11/04/2018 1845   APPEARANCEUR CLEAR 11/04/2018 1845   APPEARANCEUR Clear 10/13/2012 2130   LABSPEC 1.025 11/04/2018 1845   LABSPEC 1.002 10/13/2012 2130   PHURINE 6.0 11/04/2018 1845   GLUCOSEU 150 (A) 11/04/2018 1845   GLUCOSEU >=500 10/13/2012 2130   HGBUR MODERATE (A) 11/04/2018 1845   BILIRUBINUR NEGATIVE 11/04/2018 1845   BILIRUBINUR Negative 10/13/2012 2130   KETONESUR NEGATIVE 11/04/2018 1845   PROTEINUR 100 (A) 11/04/2018 1845   UROBILINOGEN 0.2 11/06/2014 1910   NITRITE NEGATIVE 11/04/2018 1845   LEUKOCYTESUR NEGATIVE 11/04/2018 1845   LEUKOCYTESUR Negative 10/13/2012 2130   Sepsis Labs: _0 (procalcitonin:4,lacticidven:4)  ) Recent Results (from the past 240 hour(s))  SARS Coronavirus 2 (CEPHEID - Performed in [facility] hospital lab), Hosp Order     Status: None   Collection Time: 11/04/18 11:05 PM   Specimen: Nasopharyngeal Swab  Result Value Ref Range Status   SARS Coronavirus 2 NEGATIVE NEGATIVE Final  Comment: (NOTE) If result is NEGATIVE SARS-CoV-2 target nucleic acids are NOT DETECTED. The SARS-CoV-2 RNA is generally detectable in upper and lower  respiratory specimens during the acute phase of infection. The lowest  concentration of SARS-CoV-2 viral copies this assay can detect is 250  copies / mL. A negative result does not preclude SARS-CoV-2 infection  and should not be used as the sole basis for treatment or other  patient management decisions.  A negative result may occur with  improper specimen collection / handling, submission of specimen other  than nasopharyngeal swab, presence of viral mutation(s) within the  areas targeted by this assay, and inadequate number of viral copies  (<250 copies / mL). A negative result must be combined with clinical  observations, patient history, and epidemiological  information. If result is POSITIVE SARS-CoV-2 target nucleic acids are DETECTED. The SARS-CoV-2 RNA is generally detectable in upper and lower  respiratory specimens dur ing the acute phase of infection.  Positive  results are indicative of active infection with SARS-CoV-2.  Clinical  correlation with patient history and other diagnostic information is  necessary to determine patient infection status.  Positive results do  not rule out bacterial infection or co-infection with other viruses. If result is PRESUMPTIVE POSTIVE SARS-CoV-2 nucleic acids MAY BE PRESENT.   A presumptive positive result was obtained on the submitted specimen  and confirmed on repeat testing.  While 2019 novel coronavirus  (SARS-CoV-2) nucleic acids may be present in the submitted sample  additional confirmatory testing may be necessary for epidemiological  and / or clinical management purposes  to differentiate between  SARS-CoV-2 and other Sarbecovirus currently known to infect humans.  If clinically indicated additional testing with an alternate test  methodology [phone]) is advised. The SARS-CoV-2 RNA is generally  detectable in upper and lower respiratory sp ecimens during the acute  phase of infection. The expected result is Negative. Fact Sheet for Patients:  StrictlyIdeas.no Fact Sheet for Healthcare Providers: BankingDealers.co.za This test is not yet approved or cleared by the [place] FDA and has been authorized for detection and/or diagnosis of SARS-CoV-2 by FDA under an Emergency Use Authorization (EUA).  This EUA will remain in effect (meaning this test can be used) for the duration of the COVID-19 declaration under Section 564(b)(1) of the Act, 21 U.S.C. section 360bbb-3(b)(1), unless the authorization is terminated or revoked sooner. Performed at [facility], [place] [address] [place], [place] [address]   MRSA PCR  Screening     Status: None   Collection Time: 11/05/18 12:04 AM   Specimen: Nasal Mucosa; Nasopharyngeal  Result Value Ref Range Status   MRSA by PCR NEGATIVE NEGATIVE Final    Comment:        The GeneXpert MRSA Assay (FDA approved for NASAL specimens only), is one component of a comprehensive MRSA colonization surveillance program. It is not intended to diagnose MRSA infection nor to guide or monitor treatment for MRSA infections. Performed at [facility], [place] [address] [place],  [address]   Culture, blood (routine x 2)     Status: None (Preliminary result)   Collection Time: 11/05/18 12:31 PM   Specimen: BLOOD  Result Value Ref Range Status   Specimen Description BLOOD LEFT ANTECUBITAL  Final   Special Requests   Final    BOTTLES DRAWN AEROBIC ONLY Blood Culture adequate volume   Culture   Final    NO GROWTH < 24 HOURS Performed at [facility],  [address] [place], [place] [address]    Report Status PENDING  Incomplete  Culture, blood (routine x 2)     Status: None (Preliminary result)   Collection Time: 11/05/18 12:40 PM   Specimen: BLOOD  Result Value Ref Range Status   Specimen Description   Final    BLOOD RIGHT ARM Performed at [place] [address], [place], [place] [address]    Special Requests   Final    BOTTLES DRAWN AEROBIC AND ANAEROBIC Blood Culture adequate volume Performed at [place] [address] [place], [place] [address]    Culture   Final    NO GROWTH < 24 HOURS Performed at [place] [address] [place], [place] [address]    Report Status PENDING  Incomplete      Studies: US Abdomen Limited  Result Date: 11/05/2018 CLINICAL DATA:  Cirrhosis and hepatocellular carcinoma.  Ascites. EXAM: LIMITED ABDOMEN ULTRASOUND FOR ASCITES TECHNIQUE: Limited ultrasound survey for ascites was performed in all four abdominal quadrants. COMPARISON:  None.  FINDINGS: A small amount of ascites is seen in both upper quadrants and the right lower quadrant. IMPRESSION: Small amount of ascites. Electronically Signed   By: Marlaine Hind M.D.   On: 11/05/2018 14:45    Scheduled Meds: . feeding supplement (PRO-STAT SUGAR FREE 64)  30 mL Oral BID  . folic acid  1 mg Oral Daily  . insulin aspart  0-9 Units Subcutaneous Q4H  . lactulose  300 mL Rectal QID  . LORazepam  0-4 mg Intravenous Q6H   Followed by  . [START ON 11/07/2018] LORazepam  0-4 mg Intravenous Q12H  . mouth rinse  15 mL Mouth Rinse BID  . multivitamin with minerals  1 tablet Oral Daily  . pantoprazole (PROTONIX) IV  40 mg Intravenous Q12H  . pneumococcal 23 valent vaccine  0.5 mL Intramuscular Tomorrow-1000  . potassium chloride SA  20 mEq Oral Daily  . rifaximin  550 mg Oral BID  . sodium chloride flush  3 mL Intravenous Q12H  . thiamine  100 mg Oral Daily   Or  . thiamine  100 mg Intravenous Daily    Continuous Infusions: . sodium chloride    . piperacillin-tazobactam (ZOSYN)  IV 3.375 g (11/06/18 0923)     LOS: 1 day     Alma Friendly, MD [facility]  If 7PM-7AM, please contact night-coverage [email or web address] 11/06/2018, 2:23 PM

## 2018-11-06 NOTE — Progress Notes (Signed)
Patient has new IV, was combative during IV placement. Restraints re-applied. Informed MRI. Patient now transported

## 2018-11-06 NOTE — Progress Notes (Addendum)
Progress Note    ASSESSMENT AND PLAN:   85. 63 yo male with decompensated HCV (treated)  / Etoh cirrhosis. Hx of grade I esophageal varices Feb 2020 . Hx of [place] s/p ablation, ? Metastatic disease based on MRI January 2020.   Admitted with confusion / hyperglycemia. First time seeing him today. He is restless but alert.  -When taking PO would give the Xifaxan and oral lactulose. Right now I think lactulose enemas best as he is at risk for aspiration.   -On CIWA -mild coagulopathy -On Zosyn ( ? HCAP + SBP prophylaxis) -small amount of ascites on [place]. Paracentesis with fluid studies already ordered. -Will need further evaluation of ? Metastatic HCC when acute issues resolve.   2. ? pulmonary infiltrate. On Zosyn. Nursing staff concerned that he aspirated yesterday (and this was after CXR findings). Tmax 99. No coughing or respiratory distress.   3. [place] anemia, acute on chronic. Heme positive stools (not surprising in patient with cirrhosis).  No overt GI bleeding. Received a unit of PRBC at 4am yesterday, hgb up from 6.4to 8. Normal BUN. Up to date on CRC screening ( complete exam with adequate prep in  Feb of this year).  -consider EGD prior to discharge, no urgent need right now   SUBJECTIVE   Agitated about soft restraints. Endorses generalized abdominal discomfort. No other complaints.   Unable to obtain review of systems due to patient's altered mental status OBJECTIVE:     Vital signs in last 24 hours: Temp:  [98.4 F (36.9 C)-99.3 F (37.4 C)] 98.4 F (36.9 C) (06/29 0500) Pulse Rate:  [86-92] 92 (06/29 0500) Resp:  [12-18] 12 (06/29 0500) BP: (134-143)/(76-81) 138/77 (06/29 0500) SpO2:  [96 %-98 %] 98 % (06/29 0500) Weight:  [64.5 kg] 64.5 kg (06/29 0500) Last BM Date: (PTA) General:   Alert, thin white male in NAD EENT:  Normal hearing, non icteric sclera, conjunctive pink.  Heart:  Regular rate and rhythm.  lower extremity edema   Pulm: Normal respiratory effort  Abdomen:  Soft, mild distention, nontender.  Normal bowel sounds,.       Neurologic:   agitated. Answers some questions with brief answers  Intake/Output from previous day: 06/28 0701 - 06/29 0700 In: 154.9 [IV Piggyback:154.9] Out: 1575 [Urine:1575] Intake/Output this shift: No intake/output data recorded.  Lab Results: Recent Labs    11/04/18 1845 11/05/18 1035  WBC 10.8* 12.3*  HGB 6.4* 8.0*  HCT 19.8* 24.5*  PLT 199 226   BMET Recent Labs    11/04/18 1845 11/05/18 0621  NA 127* 132*  K 4.4 4.1  CL 88* 94*  CO2 33* 31  GLUCOSE 533* 299*  BUN 25* 22  CREATININE 0.95 0.65  CALCIUM 7.8* 7.7*   LFT Recent Labs    11/05/18 0621  PROT 5.9*  ALBUMIN 1.6*  AST 33  ALT 15  ALKPHOS 134*  BILITOT 1.2   PT/INR Recent Labs    11/05/18 1209 11/06/18 0818  LABPROT 15.5* 15.4*  INR 1.2 1.2   Hepatitis Panel No results for input(s): HEPBSAG, HCVAB, HEPAIGM, HEPBIGM in the last 72 hours.  Ct Head Wo Contrast  Result Date: 11/04/2018 CLINICAL DATA:  63 year old male with altered mental status. EXAM: CT HEAD WITHOUT CONTRAST TECHNIQUE: Contiguous axial images were obtained from the base of the skull through the vertex without intravenous contrast. COMPARISON:  Head CT dated 04/27/2009 FINDINGS: Brain: Mild to moderate age-related atrophy and chronic microvascular ischemic changes. There is  no acute intracranial hemorrhage. No mass effect or midline shift. No extra-axial fluid collection. Vascular: No hyperdense vessel or unexpected calcification. Skull: Normal. Negative for fracture or focal lesion. Sinuses/Orbits: No acute finding. Other: None IMPRESSION: 1. No acute intracranial hemorrhage. 2. Age-related atrophy and chronic microvascular ischemic changes. Electronically Signed   By: Anner Crete M.D.   On: 11/04/2018 22:01   US Abdomen Limited  Result Date: 11/05/2018 CLINICAL DATA:  Cirrhosis and hepatocellular carcinoma.  Ascites. EXAM: LIMITED ABDOMEN  ULTRASOUND FOR ASCITES TECHNIQUE: Limited ultrasound survey for ascites was performed in all four abdominal quadrants. COMPARISON:  None. FINDINGS: A small amount of ascites is seen in both upper quadrants and the right lower quadrant. IMPRESSION: Small amount of ascites. Electronically Signed   By: Marlaine Hind M.D.   On: 11/05/2018 14:45   Dg Chest Port 1 View  Result Date: 11/05/2018 CLINICAL DATA:  Altered mental status. EXAM: PORTABLE CHEST 1 VIEW COMPARISON:  Chest x-ray dated 05/12/2018 and chest CT dated 05/31/2018 FINDINGS: The heart size and pulmonary vascularity are normal. Increased density at the left lung base medially may represent an infiltrate. Faint density overlying the posterior aspect of the right fourth rib is consistent with an old healed fracture as visible on the prior chest CT dated 05/31/2018. No effusions.  No acute bone abnormality. IMPRESSION: Increased density at the left base medially which may represent an early infiltrate. Electronically Signed   By: Lorriane Shire M.D.   On: 11/05/2018 13:01     Principal Problem:   Symptomatic anemia Active Problems:   Diabetes mellitus ([place])   Alcohol dependence ([place])   Altered mental status     LOS: 1 day   Tye Savoy ,NP 11/06/2018, 8:49 AM   I have discussed the case with the PA, and that is the plan I formulated. I personally interviewed and examined the patient.  CC: Decompensated alcoholic cirrhosis Other diagnoses as noted above.  Also on elevation of liver labs due to cirrhosis as well as severe protein calorie malnutrition.  Altered mental status appears to be multifactorial from alcohol related cirrhosis and probable alcohol withdrawal.  If he is unable to take lactulose orally due to poor mental status and risk of aspiration, lactulose enemas must be given as ordered. He is a great risk for decompensation of his mental status from encephalopathy.  He has acute on chronic anemia, but no overt GI bleeding  since admission.  Upper endoscopy may be considered prior to discharge, but not at this point.  He could not be safely sedated at this point other than for an urgent endoscopic procedure due to his current mental status, and he cannot give informed consent.  We will follow closely.  Signout received from Dr. Lyndel Safe, extensive chart review performed to become familiar with patient.  Total time 35 minutes. Nelida Meuse III Office: [phone]

## 2018-11-06 NOTE — TOC Progression Note (Signed)
Transition of Care [facility]) - Progression Note    Patient Details  Name: [name] MRN: [id number] Date of Birth: [birth year]  [facility] CM/SW Contact  Servando Snare, [place] Phone Number: 11/06/2018, 11:22 AM  Clinical Narrative:   LCSW consulted for dc planning. Patient is not oriented and in restraints. LCSW unable to assess patient at this time. LCSW will continue to follow for disposition.         Expected Discharge Plan and Services           Expected Discharge Date: 11/06/18                                     Social Determinants of Health (SDOH) Interventions    Readmission Risk Interventions No flowsheet data found.

## 2018-11-06 NOTE — Progress Notes (Signed)
Attempted to get patient for MRI, per RN pt is combative and agitated. We met Transporter in hallway patient was moaning and thrashing in bed. Asked Oneeka RN to call MD for Ativan for MRI as well as patient cannot be restrained in MRI.  Oneeka RN called back at 9:34p stating MD put one time order for Ativan and she will remove the patient from restraints and put him back on them when MRI is complete. MRI will be attempted this evening

## 2018-11-06 NOTE — Progress Notes (Signed)
Patient is pleasant upon shift, A X O X 1, removed restraints. Educated patient. IV team consult in for new IV placement. Will continue to monitor.

## 2018-11-07 ENCOUNTER — Inpatient Hospital Stay (HOSPITAL_COMMUNITY): Payer: Medicaid Other

## 2018-11-07 ENCOUNTER — Inpatient Hospital Stay (HOSPITAL_COMMUNITY)
Admit: 2018-11-07 | Discharge: 2018-11-07 | Disposition: A | Discharge status: Home / Self Care | Payer: Medicaid Other | Attending: Internal Medicine | Admitting: Internal Medicine

## 2018-11-07 DIAGNOSIS — R401 Stupor: Secondary | ICD-10-CM

## 2018-11-07 LAB — CBC WITH DIFFERENTIAL/PLATELET
Abs Immature Granulocytes: 0.1 10*3/uL — ABNORMAL HIGH (ref 0.00–0.07)
Basophils Absolute: 0.1 10*3/uL (ref 0.0–0.1)
Basophils Relative: 1 %
Eosinophils Absolute: 0.5 10*3/uL (ref 0.0–0.5)
Eosinophils Relative: 4 %
HCT: 28.6 % — ABNORMAL LOW (ref 39.0–52.0)
Hemoglobin: 9.6 g/dL — ABNORMAL LOW (ref 13.0–17.0)
Immature Granulocytes: 1 %
Lymphocytes Relative: 17 %
Lymphs Abs: 2.1 10*3/uL (ref 0.7–4.0)
MCH: 31.4 pg (ref 26.0–34.0)
MCHC: 33.6 g/dL (ref 30.0–36.0)
MCV: 93.5 fL (ref 80.0–100.0)
Monocytes Absolute: 1.7 10*3/uL — ABNORMAL HIGH (ref 0.1–1.0)
Monocytes Relative: 14 %
Neutro Abs: 8 10*3/uL — ABNORMAL HIGH (ref 1.7–7.7)
Neutrophils Relative %: 63 %
Platelets: 211 10*3/uL (ref 150–400)
RBC: 3.06 MIL/uL — ABNORMAL LOW (ref 4.22–5.81)
RDW: 18.1 % — ABNORMAL HIGH (ref 11.5–15.5)
WBC: 12.6 10*3/uL — ABNORMAL HIGH (ref 4.0–10.5)
nRBC: 0 % (ref 0.0–0.2)

## 2018-11-07 LAB — BASIC METABOLIC PANEL
Anion gap: 9 (ref 5–15)
BUN: 16 mg/dL (ref 8–23)
CO2: 28 mmol/L (ref 22–32)
Calcium: 8 mg/dL — ABNORMAL LOW (ref 8.9–10.3)
Chloride: 98 mmol/L (ref 98–111)
Creatinine, Ser: 0.68 mg/dL (ref 0.61–1.24)
GFR calc Af Amer: >60 mL/min (ref >60)
GFR calc non Af Amer: >60 mL/min (ref >60)
Glucose, Bld: 125 mg/dL — ABNORMAL HIGH (ref 70–99)
Potassium: 4.1 mmol/L (ref 3.5–5.1)
Sodium: 135 mmol/L (ref 135–145)

## 2018-11-07 LAB — GLUCOSE, CAPILLARY
Glucose-Capillary: 112 mg/dL — ABNORMAL HIGH (ref 70–99)
Glucose-Capillary: 110 mg/dL — ABNORMAL HIGH (ref 70–99)
Glucose-Capillary: 142 mg/dL — ABNORMAL HIGH (ref 70–99)
Glucose-Capillary: 123 mg/dL — ABNORMAL HIGH (ref 70–99)
Glucose-Capillary: 122 mg/dL — ABNORMAL HIGH (ref 70–99)

## 2018-11-07 MED ORDER — LACTULOSE 10 GM/15ML PO SOLN
30.0000 g | Freq: Four times a day (QID) | ORAL | Status: AC
  Administered 2018-11-07 (×4): 30 g
  Filled 2018-11-07 (×4): qty 45

## 2018-11-07 NOTE — Progress Notes (Signed)
EEG complete - results pending

## 2018-11-07 NOTE — Progress Notes (Signed)
Attempted to insert NG tube in both nares, both attempts unsuccessful despite restraints, and bedside RNs assistance. Pateint became very agitated thrashing in bed and grabbing staff with restrained arms. Patient unable to swallow NGT and tubing coming back out oral cavity. Attempted a smaler bore NGT with no success as patient unable to swallow. Utilized lubrication and oral swabs to help moisten throat with no success. Left nare had slight pink tinged mucus and NGT placement attempt ended.

## 2018-11-07 NOTE — Progress Notes (Signed)
NT tube was inserted 2 attempts by ICU nurse and still could not get it down. Called NP to notified at 1110.

## 2018-11-07 NOTE — Progress Notes (Signed)
RN got a phone call from MRI department at [facility] that MD agreed to cancel MRI brain for today 11/07/2018.

## 2018-11-07 NOTE — Progress Notes (Signed)
RN tried to insert NG tube as ordered on 11/07/2018 at 1000 but could not get it go through all the way due to resistance. Patient was very combative. Reported to charge nurse and will call ICU nurse to come up and assist.

## 2018-11-07 NOTE — Progress Notes (Addendum)
PROGRESS NOTE  [name] QMV:[id number] DOB: 14-May-1955 DOA: 11/04/2018 PCP: Marliss Coots, NP  HPI/Recap of past 24 hours: HPI from Dr Ardeen Fillers Bargar  is a 63 y.o. male,  w dm2 w neuropathy,  alcohol dep, Hep C cirrhosis w esophageal varices,  hepatocellular carcinoma, tobacco abuse, apparently was brought by EMS due to confusion and high blood sugar reading.  Patient was unable to give a good history.  In the ED, patient vital signs were fairly stable, CT brain with no acute intracranial hemorrhage.  Patient's glucose was noted to be 533.  COVID-19 test done was negative.  Prior to leaving the ED, became more alert, was able to carry out conversations.  Patient admitted for further management.  EDP consulted GI.    Today, patient with periods of agitation/combativeness.  Still presently encephalopathic.  Assessment/Plan: Principal Problem:   Symptomatic anemia Active Problems:   Diabetes mellitus (HCC)   Alcohol dependence (HCC)   Altered mental status   Acute Hepatic encephalopathy Likely due to decompensated liver cirrhosis 2/2 alcohol abuse/hepatitis C/HCC, R/O SBP Vs bleeding esophageal varices Vs Wernicke encephalopathy Afebrile, with leukocytosis Ammonia mildly elevated, INR 1.2 BC x2 NGTD Portable chest x-ray with possible infiltrates LA 1.9-->1.4 CT head no acute intracranial hemorrhage Abdominal USS showed small ascites IR unable to do any paracentesis due to very small ascites noted MRI brain pending EEG showed general background slowing.  No epileptiform activity is noted Continue lactulose, plan on placing NG tube Treating for Wernicke's encephalopathy with high-dose IV thiamine Start rifaximin, diuretics via NG tube IV Zosyn (??SBP, ?bleeding esophageal varices) GI on board, appreciate recs NPO, Monitor very closely, continue telemetry, SLP   History of alcohol abuse/withdrawal CIWA protocol  HCAP Afebrile with leukocytosis Chest x-ray  showing increased density at the left base which may represent an early infiltrate Continue IV Zosyn  Normocytic anemia, r/o GI bleed Hemoglobin on admission 6.4, transfused 1UPRBC---> 8 Rule out esophageal varices bleed, although no signs of GI bleeding Vitamin B12-->815 Last EGD 2/20 showed small esophageal varices GI on board: May repeat another EGD once stable Continue IV Zosyn Continue PPI   Hyperosmolar hyperglycemic state/Hx of diabetes mellitus type 2 BS on admission >500, no gap, acidosis, no ketones S/P IVF SSI, Accu-Cheks, hypoglycemic protocol Hold home metformin, Lantus as pt is NPO  Hyponatremia Resolved Likely due to above, hyperglycemia  Hx of chronic Hep C/cirrhosis/HCC Follows with GI Completed treatment for Hep C with a 12-week course of Epclusa in 04/2018 Noted to have multifocal hepatocellular carcinoma, atypical cells noted in peritoneal fluid on cytology in 05/2018.  Patient is status post thermal ablation of the 2 lesions in 09/06/2018 GI on board         Malnutrition Type:  Nutrition Problem: Increased nutrient needs Etiology: chronic illness   Malnutrition Characteristics:  Signs/Symptoms: estimated needs   Nutrition Interventions:  Interventions: Refer to RD note for recommendations    Estimated body mass index is 19.7 kg/m as calculated from the following:   Height as of this encounter: 6' (1.829 m).   Weight as of this encounter: 65.9 kg.     Code Status: Full  Family Communication: None at bedside  Disposition Plan: To be determined   Consultants:  GI  Procedures:  None  Antimicrobials:  IV Zosyn  DVT prophylaxis: SCDs   Objective: Vitals:   11/07/18 0359 11/07/18 0500 11/07/18 1321 11/07/18 1323  BP: (!) 160/94  (!) 177/107 (!) 174/95  Pulse: (!) 101  99 92  Resp: 18  18   Temp: 97.9 F (36.6 C)  (!) 97.5 F (36.4 C)   TempSrc: Oral  Oral   SpO2: 92%  97%   Weight:  65.9 kg    Height:         Intake/Output Summary (Last 24 hours) at 11/07/2018 1447 Last data filed at 11/07/2018 1121 Gross per 24 hour  Intake 273.94 ml  Output 1000 ml  Net -726.06 ml   Filed Weights   11/05/18 0557 11/06/18 0500 11/07/18 0500  Weight: 70.2 kg 64.5 kg 65.9 kg    Exam:  General:  Intermittently agitated/combative  Cardiovascular: S1, S2 present  Respiratory: CTAB  Abdomen: Soft, +tender, nondistended, bowel sounds present  Musculoskeletal: No bilateral pedal edema noted  Skin: Normal  Psychiatry:  Unable to assess   Data Reviewed: CBC: Recent Labs  Lab 11/04/18 1845 11/05/18 1035 11/06/18 0818 11/07/18 0559  WBC 10.8* 12.3* 8.3 12.6*  NEUTROABS 7.6  --  5.1 8.0*  HGB 6.4* 8.0* 8.1* 9.6*  HCT 19.8* 24.5* 25.7* 28.6*  MCV 95.7 95.7 100.0 93.5  PLT 199 226 PLATELET CLUMPS NOTED ON SMEAR, UNABLE TO ESTIMATE 259   Basic Metabolic Panel: Recent Labs  Lab 11/04/18 1845 11/05/18 0621 11/07/18 0559  NA 127* 132* 135  K 4.4 4.1 4.1  CL 88* 94* 98  CO2 33* 31 28  GLUCOSE 533* 299* 125*  BUN 25* 22 16  CREATININE 0.95 0.65 0.68  CALCIUM 7.8* 7.7* 8.0*   GFR: Estimated Creatinine Clearance: 90.4 mL/min (by C-G formula based on SCr of 0.68 mg/dL). Liver Function Tests: Recent Labs  Lab 11/04/18 1845 11/05/18 0621  AST 31 33  ALT 14 15  ALKPHOS 144* 134*  BILITOT 1.0 1.2  PROT 6.0* 5.9*  ALBUMIN 1.6* 1.6*   Recent Labs  Lab 11/04/18 1845  LIPASE 21   Recent Labs  Lab 11/05/18 0621  AMMONIA 37*   Coagulation Profile: Recent Labs  Lab 11/05/18 1209 11/06/18 0818  INR 1.2 1.2   Cardiac Enzymes: No results for input(s): CKTOTAL, CKMB, CKMBINDEX, TROPONINI in the last 168 hours. BNP (last 3 results) No results for input(s): PROBNP in the last 8760 hours. HbA1C: No results for input(s): HGBA1C in the last 72 hours. CBG: Recent Labs  Lab 11/06/18 1936 11/06/18 2355 11/07/18 0354 11/07/18 0747 11/07/18 1156  GLUCAP 160* 129* 112* 110* 142*    Lipid Profile: No results for input(s): CHOL, HDL, LDLCALC, TRIG, CHOLHDL, LDLDIRECT in the last 72 hours. Thyroid Function Tests: Recent Labs    11/05/18 0621  TSH 2.131   Anemia Panel: Recent Labs    11/05/18 0621  VITAMINB12 815   Urine analysis:    Component Value Date/Time   COLORURINE YELLOW 11/04/2018 1845   APPEARANCEUR CLEAR 11/04/2018 1845   APPEARANCEUR Clear 10/13/2012 2130   LABSPEC 1.025 11/04/2018 1845   LABSPEC 1.002 10/13/2012 2130   PHURINE 6.0 11/04/2018 1845   GLUCOSEU 150 (A) 11/04/2018 1845   GLUCOSEU >=500 10/13/2012 2130   HGBUR MODERATE (A) 11/04/2018 1845   BILIRUBINUR NEGATIVE 11/04/2018 1845   BILIRUBINUR Negative 10/13/2012 2130   KETONESUR NEGATIVE 11/04/2018 1845   PROTEINUR 100 (A) 11/04/2018 1845   UROBILINOGEN 0.2 11/06/2014 1910   NITRITE NEGATIVE 11/04/2018 1845   LEUKOCYTESUR NEGATIVE 11/04/2018 1845   LEUKOCYTESUR Negative 10/13/2012 2130   Sepsis Labs: _0 (procalcitonin:4,lacticidven:4)  ) Recent Results (from the past 240 hour(s))  SARS Coronavirus 2 (CEPHEID - Performed in [facility] hospital lab),  Hosp Order     Status: None   Collection Time: 11/04/18 11:05 PM   Specimen: Nasopharyngeal Swab  Result Value Ref Range Status   SARS Coronavirus 2 NEGATIVE NEGATIVE Final    Comment: (NOTE) If result is NEGATIVE SARS-CoV-2 target nucleic acids are NOT DETECTED. The SARS-CoV-2 RNA is generally detectable in upper and lower  respiratory specimens during the acute phase of infection. The lowest  concentration of SARS-CoV-2 viral copies this assay can detect is 250  copies / mL. A negative result does not preclude SARS-CoV-2 infection  and should not be used as the sole basis for treatment or other  patient management decisions.  A negative result may occur with  improper specimen collection / handling, submission of specimen other  than nasopharyngeal swab, presence of viral mutation(s) within the  areas targeted by  this assay, and inadequate number of viral copies  (<250 copies / mL). A negative result must be combined with clinical  observations, patient history, and epidemiological information. If result is POSITIVE SARS-CoV-2 target nucleic acids are DETECTED. The SARS-CoV-2 RNA is generally detectable in upper and lower  respiratory specimens dur ing the acute phase of infection.  Positive  results are indicative of active infection with SARS-CoV-2.  Clinical  correlation with patient history and other diagnostic information is  necessary to determine patient infection status.  Positive results do  not rule out bacterial infection or co-infection with other viruses. If result is PRESUMPTIVE POSTIVE SARS-CoV-2 nucleic acids MAY BE PRESENT.   A presumptive positive result was obtained on the submitted specimen  and confirmed on repeat testing.  While 2019 novel coronavirus  (SARS-CoV-2) nucleic acids may be present in the submitted sample  additional confirmatory testing may be necessary for epidemiological  and / or clinical management purposes  to differentiate between  SARS-CoV-2 and other Sarbecovirus currently known to infect humans.  If clinically indicated additional testing with an alternate test  methodology [phone]) is advised. The SARS-CoV-2 RNA is generally  detectable in upper and lower respiratory sp ecimens during the acute  phase of infection. The expected result is Negative. Fact Sheet for Patients:  StrictlyIdeas.no Fact Sheet for Healthcare Providers: BankingDealers.co.za This test is not yet approved or cleared by the [place] FDA and has been authorized for detection and/or diagnosis of SARS-CoV-2 by FDA under an Emergency Use Authorization (EUA).  This EUA will remain in effect (meaning this test can be used) for the duration of the COVID-19 declaration under Section 564(b)(1) of the Act, 21 U.S.C. section  360bbb-3(b)(1), unless the authorization is terminated or revoked sooner. Performed at [facility], [place] [address], [place], [place] [address]   MRSA PCR Screening     Status: None   Collection Time: 11/05/18 12:04 AM   Specimen: Nasal Mucosa; Nasopharyngeal  Result Value Ref Range Status   MRSA by PCR NEGATIVE NEGATIVE Final    Comment:        The GeneXpert MRSA Assay (FDA approved for NASAL specimens only), is one component of a comprehensive MRSA colonization surveillance program. It is not intended to diagnose MRSA infection nor to guide or monitor treatment for MRSA infections. Performed at [facility], [place] [address], [place], [place] [address]   Culture, blood (routine x 2)     Status: None (Preliminary result)   Collection Time: 11/05/18 12:31 PM   Specimen: BLOOD  Result Value Ref Range Status   Specimen Description BLOOD LEFT ANTECUBITAL  Final  Special Requests   Final    BOTTLES DRAWN AEROBIC ONLY Blood Culture adequate volume   Culture   Final    NO GROWTH 2 DAYS Performed at [facility], [place] [address], [place], [place] [address]    Report Status PENDING  Incomplete  Culture, blood (routine x 2)     Status: None (Preliminary result)   Collection Time: 11/05/18 12:40 PM   Specimen: BLOOD  Result Value Ref Range Status   Specimen Description   Final    BLOOD RIGHT ARM Performed at [place] [address], [place], [place] [address]    Special Requests   Final    BOTTLES DRAWN AEROBIC AND ANAEROBIC Blood Culture adequate volume Performed at [place] [facility] [address], [place], [place] [address]    Culture   Final    NO GROWTH 2 DAYS Performed at [place] [address], [place], [place] [address]    Report Status PENDING  Incomplete      Studies: US Abdomen Limited  Result Date: 11/06/2018 CLINICAL DATA:  Ascites. EXAM: LIMITED ABDOMEN ULTRASOUND  FOR ASCITES TECHNIQUE: Limited ultrasound survey for ascites was performed in all four abdominal quadrants. COMPARISON:  Abdominal ultrasound from yesterday. FINDINGS: Unchanged small ascites in the right upper and lower quadrants. IMPRESSION: 1. Unchanged small ascites. Electronically Signed   By: Titus Dubin M.D.   On: 11/06/2018 16:40    Scheduled Meds: . feeding supplement (PRO-STAT SUGAR FREE 64)  30 mL Oral BID  . folic acid  1 mg Oral Daily  . insulin aspart  0-9 Units Subcutaneous Q4H  . lactulose  30 g Per Tube QID  . LORazepam  0-4 mg Intravenous Q12H  . mouth rinse  15 mL Mouth Rinse BID  . multivitamin with minerals  1 tablet Oral Daily  . pantoprazole (PROTONIX) IV  40 mg Intravenous Q12H  . rifaximin  550 mg Oral BID  . sodium chloride flush  3 mL Intravenous Q12H    Continuous Infusions: . sodium chloride    . piperacillin-tazobactam (ZOSYN)  IV 3.375 g (11/07/18 1011)  . [START ON 11/09/2018] thiamine injection    . thiamine injection 500 mg (11/07/18 1121)     LOS: 2 days     Alma Friendly, MD [facility]  If 7PM-7AM, please contact night-coverage [email or web address] 11/07/2018, 2:47 PM

## 2018-11-07 NOTE — Progress Notes (Signed)
SLP Cancellation Note  Patient Details Name: [name] MRN: [id number] DOB: Nov 03, 1955   Cancelled treatment:       Reason Eval/Treat Not Completed: Patient at procedure or test/unavailable. As SLP arriving to patient's room, radiology was taking him for NG tube placement. Per RN, patient is not able to swallow anything and they tried to pass an NG tube at bedside but patient combative and unable to pass through either nares. SLP to attempt to see patient later in PM or next date.    Nadara Mode Tarrell 11/07/2018, 2:31 PM\  Sonia Baller, MA, [facility] Pager: [phone]

## 2018-11-07 NOTE — Progress Notes (Addendum)
Progress Note    ASSESSMENT AND PLAN:    46. 63 yo male with decompensated HCV (treated) / Etoh cirrhosis. Hx of grade I esophageal varices Feb 2020 . Hx of [place] s/p ablation, ? metastatic disease based on MRI January 2020.   Admitted with confusion -Encephalopathy probably multifactorial ( Etoh withdrawal / PSE). Can still be hepatic encephalopathy even with ammonia of only 37.  Head CT scan and MRI brain negative for acute abnormalities. He is getting an EEG right now.  -Refused all but one of the lactulose enemas ordered yesterday. Too high risk to take PO lactulose right now. Need to keep trying to encourage him to take the enemas.   -When taking PO give the Xifaxan and oral lactulose.  -On CIWA. Agitated / combative last night, got dose of Ativan at 945pm   -Only small amount of ascites on [place], insufficient amount for dx paracentesis.  -On Zosyn ( ? HCAP + SBP prophylaxis) -Will need further evaluation of ? Metastatic HCC when acute issues resolve.   2. ? pulmonary infiltrate. WBC 12.6, afebrile. On Zosyn.   3. [place] anemia, acute on chronic. Heme positive stools (not surprising in patient with cirrhosis).  No overt GI bleeding. One [facility] given this admission and hgb continues to improve ( 6.4 >>>9.6).  Up to date on CRC screening -consider EGD prior to discharge, no urgent need right now       SUBJECTIVE    EEG in progress. In soft restraints. NAD, sleeping but easily awoken    OBJECTIVE:     Vital signs in last 24 hours: Temp:  [97.8 F (36.6 C)-97.9 F (36.6 C)] 97.9 F (36.6 C) (06/30 0359) Pulse Rate:  [90-101] 101 (06/30 0359) Resp:  [16-18] 18 (06/30 0359) BP: (156-167)/(90-101) 160/94 (06/30 0359) SpO2:  [92 %-98 %] 92 % (06/30 0359) Weight:  [65.9 kg] 65.9 kg (06/30 0500) Last BM Date: (PTA) General:   NAD, sleeping.  Heart:  Regular rate and rhythm  Trace BLE edema   Pulm: Normal respiratory effort Abdomen:  Soft, nondistended, nontender.  Normal bowel  sounds,.       Neurologic:  In restraints but does seem to have some asterixis. Moans at times, keeps eyes closed.    Intake/Output from previous day: 06/29 0701 - 06/30 0700 In: 173.9 [IV Piggyback:173.9] Out: 1300 [Urine:1300] Intake/Output this shift: No intake/output data recorded.  Lab Results: Recent Labs    11/05/18 1035 11/06/18 0818 11/07/18 0559  WBC 12.3* 8.3 12.6*  HGB 8.0* 8.1* 9.6*  HCT 24.5* 25.7* 28.6*  PLT 226 PLATELET CLUMPS NOTED ON SMEAR, UNABLE TO ESTIMATE 211   BMET Recent Labs    11/04/18 1845 11/05/18 0621 11/07/18 0559  NA 127* 132* 135  K 4.4 4.1 4.1  CL 88* 94* 98  CO2 33* 31 28  GLUCOSE 533* 299* 125*  BUN 25* 22 16  CREATININE 0.95 0.65 0.68  CALCIUM 7.8* 7.7* 8.0*   LFT Recent Labs    11/05/18 0621  PROT 5.9*  ALBUMIN 1.6*  AST 33  ALT 15  ALKPHOS 134*  BILITOT 1.2   PT/INR Recent Labs    11/05/18 1209 11/06/18 0818  LABPROT 15.5* 15.4*  INR 1.2 1.2   Hepatitis Panel No results for input(s): HEPBSAG, HCVAB, HEPAIGM, HEPBIGM in the last 72 hours.  US Abdomen Limited  Result Date: 11/06/2018 CLINICAL DATA:  Ascites. EXAM: LIMITED ABDOMEN ULTRASOUND FOR ASCITES TECHNIQUE: Limited ultrasound survey for ascites was performed in  all four abdominal quadrants. COMPARISON:  Abdominal ultrasound from yesterday. FINDINGS: Unchanged small ascites in the right upper and lower quadrants. IMPRESSION: 1. Unchanged small ascites. Electronically Signed   By: Titus Dubin M.D.   On: 11/06/2018 16:40   US Abdomen Limited  Result Date: 11/05/2018 CLINICAL DATA:  Cirrhosis and hepatocellular carcinoma.  Ascites. EXAM: LIMITED ABDOMEN ULTRASOUND FOR ASCITES TECHNIQUE: Limited ultrasound survey for ascites was performed in all four abdominal quadrants. COMPARISON:  None. FINDINGS: A small amount of ascites is seen in both upper quadrants and the right lower quadrant. IMPRESSION: Small amount of ascites. Electronically Signed   By: Marlaine Hind  M.D.   On: 11/05/2018 14:45   Dg Chest Port 1 View  Result Date: 11/05/2018 CLINICAL DATA:  Altered mental status. EXAM: PORTABLE CHEST 1 VIEW COMPARISON:  Chest x-ray dated 05/12/2018 and chest CT dated 05/31/2018 FINDINGS: The heart size and pulmonary vascularity are normal. Increased density at the left lung base medially may represent an infiltrate. Faint density overlying the posterior aspect of the right fourth rib is consistent with an old healed fracture as visible on the prior chest CT dated 05/31/2018. No effusions.  No acute bone abnormality. IMPRESSION: Increased density at the left base medially which may represent an early infiltrate. Electronically Signed   By: Lorriane Shire M.D.   On: 11/05/2018 13:01     Principal Problem:   Symptomatic anemia Active Problems:   Diabetes mellitus ([place])   Alcohol dependence ([place])   Altered mental status     LOS: 2 days   [name] ,NP 11/07/2018, 8:40 AM   I have discussed the case with the PA, and that is the plan I formulated. I personally interviewed and examined the patient.  CC: Decompensated alcohol-related cirrhosis with altered mental status  I found this patient to be obtunded earlier this morning, and and very concerned.  His encephalopathy is likely to be a combination of alcohol withdrawal and hepatic encephalopathy, despite a only mildly elevated ammonia of 37 upon admission.  Internal medicine is considering other potential explanations for his encephalopathy, EEG and MRI are planned.  It is difficult to say whether these will be high yield studies given the patient's current status of alternating between obtundation and combativeness.  He does not have overt GI bleeding, acid-base or electrolyte derangement, nor is he likely to have SBP with only trace ascites seen on admission imaging.  Since we know he is at high risk of further decompensation from poor mental status, and believe that at least some of this is  hepatic encephalopathy, that condition should be treated aggressively.  Lactulose enemas have not been feasible so far according to nursing.  They have been unable to pass a nasoduodenal tube, as documented in the chart.  We have communicated with the radiology department and asked them to make an attempt to put either a nasoduodenal or nasogastric tube in this patient so he can be given lactulose 45 cc every 8 hours at least 24 hours, then will reassess dosing based on patient's clinical progress.  Hemoglobin is improved after transfusion, no overt GI bleeding at present, upper endoscopy might be considered when the patient is stable sometime prior to discharge, but not in his current condition.  Total time 25 minutes, over half spent in coordination of care.  Nelida Meuse III Office: [phone]

## 2018-11-07 NOTE — Procedures (Signed)
ELECTROENCEPHALOGRAM REPORT   Patient: [name]       Room #: 9983 EEG No. ID: [id number] Age: 63 y.o.        Sex: male Referring Physician: Horris Latino Report Date:  11/07/2018        Interpreting Physician: Alexis Goodell  History: [name] is an 63 y.o. male with altered mental status  Medications:  Folvite, Insulin, Lactulose, MVI, Zosyn, Xifaxan, Thiamine, Ativan  Conditions of Recording:  This is a 21 channel routine scalp EEG performed with bipolar and monopolar montages arranged in accordance to the international 10/20 system of electrode placement. One channel was dedicated to EKG recording.  The patient is in the drowsy state.  Patient sedated with Ativan.  Description:  The background activity is slow and poorly organized.  It consists most predominantly of a low voltage polymorphic delta rhythm.  Some occasional intermixed theta is noted as well.  This activity is persistent throughout the recording and is diffusely distributed.   No epileptiform activity is noted.  Hyperventilation and intermittent photic stimulation were not performed.  IMPRESSION: This electroencephalogram is characterized by general background slowing.  This may be seen with with a diffuse cerebral disturbance such as an encephalopathy but can not rule out normal drowse and/or medication effect as well.  Clinical correlation recommended.  No epileptiform activity is noted.     Alexis Goodell, MD Neurology [phone] 11/07/2018, 9:25 AM

## 2018-11-08 DIAGNOSIS — K7290 Hepatic failure, unspecified without coma: Secondary | ICD-10-CM

## 2018-11-08 LAB — BASIC METABOLIC PANEL
Anion gap: 9 (ref 5–15)
BUN: 18 mg/dL (ref 8–23)
CO2: 26 mmol/L (ref 22–32)
Calcium: 7.6 mg/dL — ABNORMAL LOW (ref 8.9–10.3)
Chloride: 100 mmol/L (ref 98–111)
Creatinine, Ser: 0.72 mg/dL (ref 0.61–1.24)
GFR calc Af Amer: >60 mL/min (ref >60)
GFR calc non Af Amer: >60 mL/min (ref >60)
Glucose, Bld: 191 mg/dL — ABNORMAL HIGH (ref 70–99)
Potassium: 4 mmol/L (ref 3.5–5.1)
Sodium: 135 mmol/L (ref 135–145)

## 2018-11-08 LAB — CBC WITH DIFFERENTIAL/PLATELET
Abs Immature Granulocytes: 0.05 10*3/uL (ref 0.00–0.07)
Basophils Absolute: 0.1 10*3/uL (ref 0.0–0.1)
Basophils Relative: 0 %
Eosinophils Absolute: 0.2 10*3/uL (ref 0.0–0.5)
Eosinophils Relative: 2 %
HCT: 25.6 % — ABNORMAL LOW (ref 39.0–52.0)
Hemoglobin: 8.5 g/dL — ABNORMAL LOW (ref 13.0–17.0)
Immature Granulocytes: 0 %
Lymphocytes Relative: 12 %
Lymphs Abs: 1.6 10*3/uL (ref 0.7–4.0)
MCH: 32.3 pg (ref 26.0–34.0)
MCHC: 33.2 g/dL (ref 30.0–36.0)
MCV: 97.3 fL (ref 80.0–100.0)
Monocytes Absolute: 1.3 10*3/uL — ABNORMAL HIGH (ref 0.1–1.0)
Monocytes Relative: 10 %
Neutro Abs: 9.4 10*3/uL — ABNORMAL HIGH (ref 1.7–7.7)
Neutrophils Relative %: 76 %
Platelets: 218 10*3/uL (ref 150–400)
RBC: 2.63 MIL/uL — ABNORMAL LOW (ref 4.22–5.81)
RDW: 18.7 % — ABNORMAL HIGH (ref 11.5–15.5)
WBC: 12.6 10*3/uL — ABNORMAL HIGH (ref 4.0–10.5)
nRBC: 0 % (ref 0.0–0.2)

## 2018-11-08 LAB — GLUCOSE, CAPILLARY
Glucose-Capillary: 117 mg/dL — ABNORMAL HIGH (ref 70–99)
Glucose-Capillary: 131 mg/dL — ABNORMAL HIGH (ref 70–99)
Glucose-Capillary: 143 mg/dL — ABNORMAL HIGH (ref 70–99)
Glucose-Capillary: 145 mg/dL — ABNORMAL HIGH (ref 70–99)
Glucose-Capillary: 151 mg/dL — ABNORMAL HIGH (ref 70–99)
Glucose-Capillary: 176 mg/dL — ABNORMAL HIGH (ref 70–99)
Glucose-Capillary: 90 mg/dL (ref 70–99)

## 2018-11-08 MED ORDER — HALOPERIDOL LACTATE 5 MG/ML IJ SOLN
2.0000 mg | Freq: Four times a day (QID) | INTRAMUSCULAR | Status: DC | PRN
  Administered 2018-11-08: 2 mg via INTRAVENOUS
  Filled 2018-11-08: qty 1

## 2018-11-08 MED ORDER — LACTULOSE ENEMA
300.0000 mL | Freq: Two times a day (BID) | ORAL | Status: DC
  Administered 2018-11-09: 300 mL via RECTAL
  Filled 2018-11-08 (×4): qty 300

## 2018-11-08 MED ORDER — SODIUM CHLORIDE 0.9 % IV SOLN: 250.0000 mL | INTRAVENOUS | Status: DC | PRN

## 2018-11-08 NOTE — Progress Notes (Signed)
This shift arrived in pts room and NG tube out of nose and in bed. Attending notified. Pt continues to be very combative when restraints removed to clean pt. This information will be passed to oncoming shift RN

## 2018-11-08 NOTE — Progress Notes (Signed)
PROGRESS NOTE    [name]  VOJ:[id number] DOB: 1955-10-13 DOA: 11/04/2018 PCP: Marliss Coots, NP     Brief Narrative:  [name] a62 y.o.male,with past medical history significant for DM type 2 w neuropathy, alcohol dependency, Hep C cirrhosis w esophageal varices, hepatocellular carcinoma, tobacco abuse, apparently was brought by EMS due to confusion and high blood sugar reading.  Patient was unable to give a good history.  In the ED, patient vital signs were fairly stable, CT brain with no acute intracranial hemorrhage.  Patient's glucose was noted to be 533.  COVID-19 test done was negative. Patient admitted for acute encephalopathy.  GI consulted.   New events last 24 hours / Subjective: Patient pulled out NGT early this morning. He remains confused, agitated, in wrist restraints, has stool in bed and on his hands.   Assessment & Plan:   Principal Problem:   Symptomatic anemia Active Problems:   Diabetes mellitus (HCC)   Alcohol dependence ([place])   Altered mental status   Acute encephalopathy, multifactorial in nature, concern for hepatic encephalopathy as well as possibly Warnicke's encephalopathy and ?metastatic disease -Patient's initial ammonia level 37 -CT head showed no acute intracranial hemorrhage -EEG showed general background slowing.  No epileptiform activity is noted -He has been given lactulose enema as well as lactulose by NG tube.  NG tube was removed overnight by patient -Discussed with GI, concern for etiology of his altered mental status.  Suspect component of hepatic encephalopathy, although doubt as primary etiology.  Need to rule out possibly metastatic disease to the brain, also question vasculitis with elevated sed rate of 130. -MRI brain unable to be completed due to patient's agitation.  If this is pursued, will need general anesthesia at [facility] lactulose enema twice daily.  Rifaximin when able to take  p.o. -Continue high-dose IV thiamine  History of alcohol abuse -Ex-wife/friend states that patient has not had alcohol intake in 3 years  HCAP -Chest x-ray showed increased density at the left base which may represent an early infiltrate. Continue IV Zosyn  Normocytic anemia -Hemoglobin on admission 6.4, transfused 1UPRBC---> 8. Continue PPI  Hyperosmolar hyperglycemic state/Hx of diabetes mellitus type 2 -BS on admission >500, no gap, acidosis, no ketones -Continue sliding scale insulin  Hyponatremia -Resolved  Hx of chronic Hep C/cirrhosis/HCC -Follows with GI -Completed treatment for Hep C with a 12-week course of Epclusa in 04/2018 -Noted to have multifocal hepatocellular carcinoma, atypical cells noted in peritoneal fluid on cytology in 05/2018.  Patient is status post thermal ablation of the 2 lesions in 09/06/2018 -Recommend MRI abdomen to rule out progression of multifocal hepatocellular carcinoma and/or metastatic disease.  Unfortunately due to patient's agitation, unable to get MRI done with IV sedation.  If this is to be pursued, will need general anesthesia at [facility]  Failure to thrive -Over the past 3 to 4 months with significant weight loss  Fall risk -Over the past 3 to 4 months, has had multiple falls at home  Goals of care -Patient has unexplained etiology of his altered mentation. Question possibly metastatic disease, vasculitis, vs other with hepatic encephalopathy. If MRI brain and MRI abdomen are pursued, would need to be under general anesthesia at [facility] due to patient's agitation and intolerance for anti-anxiolytic.  Patient has had decline over the past 3 to 4 months with poor quality of life at baseline.  Spoke at length with patient's friend Arbie Cookey who is his ex-wife.  She  states that she is his only family/friend who was involved in his life currently.  He does have 2 brothers and 3 children who are not involved and patient has  not spoken with his adult children in over 20 years. Parents are deceased. She does not have formal healthcare power of attorney paperwork on file.  We discussed options for general anesthesia and MRI to find out etiology, although unclear if and what treatments would be possible for patient. Also there is risk of general anesthesia due to his poor baseline mentation currently. The other option would be to pursue palliative care/hospice.  Arbie Cookey will speak with patient's brothers and children and see if they would like to be involved at this point. Palliative care consulted.    DVT prophylaxis: SCD Code Status: Full Family Communication: His ex-wife Arbie Cookey who is now his friend is his closest contact, discussed care with her today.  Disposition Plan: Palliative care consultation    Consultants:   GI, signed off 7/1   Palliative care  Procedures:   None   Antimicrobials:  Anti-infectives (From admission, onward)   Start     Dose/Rate Route Frequency Ordered Stop   11/05/18 1800  piperacillin-tazobactam (ZOSYN) IVPB 3.375 g     3.375 g 12.5 mL/hr over 240 Minutes Intravenous Every 8 hours 11/05/18 1608     11/05/18 1400  cefTRIAXone (ROCEPHIN) 1 g in sodium chloride 0.9 % 100 mL IVPB  Status:  Discontinued     1 g 200 mL/hr over 30 Minutes Intravenous Every 24 hours 11/05/18 1142 11/05/18 1603   11/05/18 1400  rifaximin (XIFAXAN) tablet 550 mg     550 mg Oral 2 times daily 11/05/18 1142          Objective: Vitals:   11/07/18 1323 11/07/18 1956 11/08/18 0005 11/08/18 0519  BP: (!) 174/95 (!) 172/84 (!) 159/85 (!) 170/82  Pulse: 92 88  98  Resp:  14  16  Temp:  97.6 F (36.4 C)  98.4 F (36.9 C)  TempSrc:  Oral  Oral  SpO2:  97%  98%  Weight:    67.2 kg  Height:        Intake/Output Summary (Last 24 hours) at 11/08/2018 1059 Last data filed at 11/08/2018 1884 Gross per 24 hour  Intake 100 ml  Output 640 ml  Net -540 ml   Filed Weights   11/06/18 0500 11/07/18 0500  11/08/18 0519  Weight: 64.5 kg 65.9 kg 67.2 kg    Examination:  General exam: Appears confused, agitated, stool in hand and feet, soiled bedsheets, on restraints  Respiratory system: Respiratory effort normal  Psychiatry: Agitated, uncooperative, confused   Data Reviewed: I have personally reviewed following labs and imaging studies  CBC: Recent Labs  Lab 11/04/18 1845 11/05/18 1035 11/06/18 0818 11/07/18 0559 11/08/18 0541  WBC 10.8* 12.3* 8.3 12.6* 12.6*  NEUTROABS 7.6  --  5.1 8.0* 9.4*  HGB 6.4* 8.0* 8.1* 9.6* 8.5*  HCT 19.8* 24.5* 25.7* 28.6* 25.6*  MCV 95.7 95.7 100.0 93.5 97.3  PLT 199 226 PLATELET CLUMPS NOTED ON SMEAR, UNABLE TO ESTIMATE 211 166   Basic Metabolic Panel: Recent Labs  Lab 11/04/18 1845 11/05/18 0621 11/07/18 0559 11/08/18 0541  NA 127* 132* 135 135  K 4.4 4.1 4.1 4.0  CL 88* 94* 98 100  CO2 33* _0 GLUCOSE 533* 299* 125* 191*  BUN 25* _1 CREATININE 0.95 0.65 0.68 0.72  CALCIUM 7.8* 7.7* 8.0* 7.6*  GFR: Estimated Creatinine Clearance: 92.2 mL/min (by C-G formula based on SCr of 0.72 mg/dL). Liver Function Tests: Recent Labs  Lab 11/04/18 1845 11/05/18 0621  AST 31 33  ALT 14 15  ALKPHOS 144* 134*  BILITOT 1.0 1.2  PROT 6.0* 5.9*  ALBUMIN 1.6* 1.6*   Recent Labs  Lab 11/04/18 1845  LIPASE 21   Recent Labs  Lab 11/05/18 0621  AMMONIA 37*   Coagulation Profile: Recent Labs  Lab 11/05/18 1209 11/06/18 0818  INR 1.2 1.2   Cardiac Enzymes: No results for input(s): CKTOTAL, CKMB, CKMBINDEX, TROPONINI in the last 168 hours. BNP (last 3 results) No results for input(s): PROBNP in the last 8760 hours. HbA1C: No results for input(s): HGBA1C in the last 72 hours. CBG: Recent Labs  Lab 11/07/18 1554 11/07/18 1955 11/07/18 2359 11/08/18 0414 11/08/18 0741  GLUCAP 123* 122* 151* 176* 145*   Lipid Profile: No results for input(s): CHOL, HDL, LDLCALC, TRIG, CHOLHDL, LDLDIRECT in the last 72 hours. Thyroid  Function Tests: No results for input(s): TSH, T4TOTAL, FREET4, T3FREE, THYROIDAB in the last 72 hours. Anemia Panel: No results for input(s): VITAMINB12, FOLATE, FERRITIN, TIBC, IRON, RETICCTPCT in the last 72 hours. Sepsis Labs: Recent Labs  Lab 11/05/18 1255 11/05/18 1501  LATICACIDVEN 1.9 1.4    Recent Results (from the past 240 hour(s))  SARS Coronavirus 2 (CEPHEID - Performed in [facility] hospital lab), Hosp Order     Status: None   Collection Time: 11/04/18 11:05 PM   Specimen: Nasopharyngeal Swab  Result Value Ref Range Status   SARS Coronavirus 2 NEGATIVE NEGATIVE Final    Comment: (NOTE) If result is NEGATIVE SARS-CoV-2 target nucleic acids are NOT DETECTED. The SARS-CoV-2 RNA is generally detectable in upper and lower  respiratory specimens during the acute phase of infection. The lowest  concentration of SARS-CoV-2 viral copies this assay can detect is 250  copies / mL. A negative result does not preclude SARS-CoV-2 infection  and should not be used as the sole basis for treatment or other  patient management decisions.  A negative result may occur with  improper specimen collection / handling, submission of specimen other  than nasopharyngeal swab, presence of viral mutation(s) within the  areas targeted by this assay, and inadequate number of viral copies  (<250 copies / mL). A negative result must be combined with clinical  observations, patient history, and epidemiological information. If result is POSITIVE SARS-CoV-2 target nucleic acids are DETECTED. The SARS-CoV-2 RNA is generally detectable in upper and lower  respiratory specimens dur ing the acute phase of infection.  Positive  results are indicative of active infection with SARS-CoV-2.  Clinical  correlation with patient history and other diagnostic information is  necessary to determine patient infection status.  Positive results do  not rule out bacterial infection or co-infection with other  viruses. If result is PRESUMPTIVE POSTIVE SARS-CoV-2 nucleic acids MAY BE PRESENT.   A presumptive positive result was obtained on the submitted specimen  and confirmed on repeat testing.  While 2019 novel coronavirus  (SARS-CoV-2) nucleic acids may be present in the submitted sample  additional confirmatory testing may be necessary for epidemiological  and / or clinical management purposes  to differentiate between  SARS-CoV-2 and other Sarbecovirus currently known to infect humans.  If clinically indicated additional testing with an alternate test  methodology [phone]) is advised. The SARS-CoV-2 RNA is generally  detectable in upper and lower respiratory sp ecimens during the acute  phase of  infection. The expected result is Negative. Fact Sheet for Patients:  StrictlyIdeas.no Fact Sheet for Healthcare Providers: BankingDealers.co.za This test is not yet approved or cleared by the [place] FDA and has been authorized for detection and/or diagnosis of SARS-CoV-2 by FDA under an Emergency Use Authorization (EUA).  This EUA will remain in effect (meaning this test can be used) for the duration of the COVID-19 declaration under Section 564(b)(1) of the Act, 21 U.S.C. section 360bbb-3(b)(1), unless the authorization is terminated or revoked sooner. Performed at [facility], [place] [address], [place], [place] [address]   MRSA PCR Screening     Status: None   Collection Time: 11/05/18 12:04 AM   Specimen: Nasal Mucosa; Nasopharyngeal  Result Value Ref Range Status   MRSA by PCR NEGATIVE NEGATIVE Final    Comment:        The GeneXpert MRSA Assay (FDA approved for NASAL specimens only), is one component of a comprehensive MRSA colonization surveillance program. It is not intended to diagnose MRSA infection nor to guide or monitor treatment for MRSA infections. Performed at [facility], [place]  [address], [place], [place] [address]   Culture, blood (routine x 2)     Status: None (Preliminary result)   Collection Time: 11/05/18 12:31 PM   Specimen: BLOOD  Result Value Ref Range Status   Specimen Description BLOOD LEFT ANTECUBITAL  Final   Special Requests   Final    BOTTLES DRAWN AEROBIC ONLY Blood Culture adequate volume   Culture   Final    NO GROWTH 3 DAYS Performed at [place] [address] [place], [place] [address]    Report Status PENDING  Incomplete  Culture, blood (routine x 2)     Status: None (Preliminary result)   Collection Time: 11/05/18 12:40 PM   Specimen: BLOOD  Result Value Ref Range Status   Specimen Description   Final    BLOOD RIGHT ARM Performed at [facility] [address], [place], [place] [address]    Special Requests   Final    BOTTLES DRAWN AEROBIC AND ANAEROBIC Blood Culture adequate volume Performed at [facility] [address], [place], [place] [address]    Culture   Final    NO GROWTH 3 DAYS Performed at [facility], [place] [address] [place], [place] [address]    Report Status PENDING  Incomplete       Radiology Studies: US Abdomen Limited  Result Date: 11/06/2018 CLINICAL DATA:  Ascites. EXAM: LIMITED ABDOMEN ULTRASOUND FOR ASCITES TECHNIQUE: Limited ultrasound survey for ascites was performed in all four abdominal quadrants. COMPARISON:  Abdominal ultrasound from yesterday. FINDINGS: Unchanged small ascites in the right upper and lower quadrants. IMPRESSION: 1. Unchanged small ascites. Electronically Signed   By: Titus Dubin M.D.   On: 11/06/2018 16:40   Dg Intro Donald Pore Tube  Result Date: 11/07/2018 CLINICAL DATA:  Hepatic encephalopathy, need for medication administration and feeding. EXAM: INTRO LONG GI TUBE FLUOROSCOPY TIME:  Fluoroscopy Time:  1 minutes, 52 seconds Radiation Exposure Index (if provided by the fluoroscopic device): 28.9 mGy Number of Acquired Spot  Images: 0 COMPARISON:  None. FINDINGS: With the patient gently held due to combativeness, and after use of viscous lidocaine on the right nostril, a feeding tube was advanced through the right nostril into the stomach under fluoroscopic observation. A variety of attempts and maneuvers were made, including use of a Amplatz super stiff Glidewire,  to advance the tube past the pylorus, without further success. After attempts for further advancement were made, the tube was taped in place with the tip in the vicinity of the stomach antrum. IMPRESSION: 1. Fluoroscopically guided placement of a feeding tube. The tip of the feeding tube is in the stomach antrum. Electronically Signed   By: Van Clines M.D.   On: 11/07/2018 16:33      Scheduled Meds:  feeding supplement (PRO-STAT SUGAR FREE 64)  30 mL Oral BID   folic acid  1 mg Oral Daily   insulin aspart  0-9 Units Subcutaneous Q4H   lactulose  300 mL Rectal BID   LORazepam  0-4 mg Intravenous Q12H   mouth rinse  15 mL Mouth Rinse BID   multivitamin with minerals  1 tablet Oral Daily   pantoprazole (PROTONIX) IV  40 mg Intravenous Q12H   rifaximin  550 mg Oral BID   sodium chloride flush  3 mL Intravenous Q12H   Continuous Infusions:  sodium chloride     piperacillin-tazobactam (ZOSYN)  IV 3.375 g (11/08/18 1054)   [START ON 11/09/2018] thiamine injection     thiamine injection 500 mg (11/08/18 0955)     LOS: 3 days    Time spent: 45 minutes   Dessa Phi, DO [facility] [email or web address] 11/08/2018, 10:59 AM

## 2018-11-08 NOTE — Progress Notes (Signed)
Referral received for goals of care. Patient remains confused with agitation. No family at the bedside.   Ex-wife/friend Alpha Gula), and I met in the lobby. She was coming to pick up patient belongings. We briefly discussed patient and family support. Patient's sons were called and some updates provided. Sons verbalized not having a relationship with him for years however, they do not want him to suffer. Carol tearful during conversation. Emotional support provided. Wife asking to further discuss tomorrow and allow her time to gather thoughts. She and family is requesting to discuss hospice and comfort. Family shares they are leaning more towards comfort knowing his overall condition. Support given.   At family request Hypol[place] discussion scheduled tomorrow 7/2 @ 1430. Family aware I will meet them in the lobby (wife and 2 brothers) sons declined to visit.   Detailed note and recommendations to follow.   Thank you for your referral.   Total Time: 20 min.   Greater than 50%  of this time was spent counseling and coordinating care related to the above assessment and plan   Alda Lea, AGPCNP-BC Palliative Medicine Team  Phone: [phone] Pager: [phone] Amion: N. [place]

## 2018-11-08 NOTE — Progress Notes (Signed)
SLP Cancellation Note  Patient Details Name: FOSTER FRERICKS MRN: [id number] DOB: [birth year]   Cancelled treatment:       Reason Eval/Treat Not Completed: Patient not medically ready. Patient continues to not be ready for BSE. He is agitated, on four-point restraints, pulled out NG tube early this AM. Will continue to follow for readiness for BSE.   Nadara Mode Tarrell 11/08/2018, 10:03 AM  Sonia Baller, MA, CCC-SLP Speech Therapy [facility] Pager: [phone]

## 2018-11-08 NOTE — Progress Notes (Signed)
Patient had BM, smearing everywhere & pulling at lines. Being combative with staff during bath. Restraints re-applied. Patient still agitated, and yelling out. Haldol given. Will continue to monitor.

## 2018-11-08 NOTE — Progress Notes (Addendum)
Patient ID: [name], male   DOB: 1955/08/29, 63 y.o.   MRN: [id number]    Progress Note   Subjective  Went in to examine patient, and found him in soft restraints, yelling out and moaning.  He has stool all over his hand and foot and spread over the bed.  He is trying to put his hand in his mouth.  When I spoke to him he said a few words that did not make any sense. Patient was combative most of yesterday, staff was unable to place NG tube to instill lactulose.  He finally had this done in radiology Nursing since he did receive a few doses of lactulose and then pulled out the NG tube Nursing staff says he did receive some lactulose enemas yesterday.  MRI of the head was canceled due to agitation EEG shows generalized background slowing as can be seen with a diffuse cerebral disturbance as encephalopathy not rule out medication effect.  Hemoglobin 8.5 stable   Objective   Vital signs in last 24 hours: Temp:  [97.5 F (36.4 C)-98.4 F (36.9 C)] 98.4 F (36.9 C) (07/01 0519) Pulse Rate:  [88-99] 98 (07/01 0519) Resp:  [14-18] 16 (07/01 0519) BP: (159-177)/(82-107) 170/82 (07/01 0519) SpO2:  [97 %-98 %] 98 % (07/01 0519) Weight:  [67.2 kg] 67.2 kg (07/01 0519) Last BM Date: (PTA) General: Older white male, agitated, yelling out, not oriented, trying to put his hand in his mouth which is covered with stool. Heart:  Regular rate and rhythm; no murmurs Lungs: Respirations even and unlabored, lungs CTA bilaterally Abdomen:  Soft, nontender and nondistended. Normal bowel sounds. Extremities:  Without edema. Neurologic: Agitated and uncooperative, confused, in soft restraints. . ROS:   patient cannot provide any history or review of systems due to altered mental status  Intake/Output from previous day: 06/30 0701 - 07/01 0700 In: 100 [IV Piggyback:100] Out: 5170 [Urine:1040] Intake/Output this shift: No intake/output data recorded.  Lab Results: Recent Labs    11/06/18  0818 11/07/18 0559 11/08/18 0541  WBC 8.3 12.6* 12.6*  HGB 8.1* 9.6* 8.5*  HCT 25.7* 28.6* 25.6*  PLT PLATELET CLUMPS NOTED ON SMEAR, UNABLE TO ESTIMATE 211 218   BMET Recent Labs    11/07/18 0559 11/08/18 0541  NA 135 135  K 4.1 4.0  CL 98 100  CO2 28 26  GLUCOSE 125* 191*  BUN 16 18  CREATININE 0.68 0.72  CALCIUM 8.0* 7.6*   LFT No results for input(s): PROT, ALBUMIN, AST, ALT, ALKPHOS, BILITOT, BILIDIR, IBILI in the last 72 hours. PT/INR Recent Labs    11/05/18 1209 11/06/18 0818  LABPROT 15.5* 15.4*  INR 1.2 1.2    Studies/Results: US Abdomen Limited  Result Date: 11/06/2018 CLINICAL DATA:  Ascites. EXAM: LIMITED ABDOMEN ULTRASOUND FOR ASCITES TECHNIQUE: Limited ultrasound survey for ascites was performed in all four abdominal quadrants. COMPARISON:  Abdominal ultrasound from yesterday. FINDINGS: Unchanged small ascites in the right upper and lower quadrants. IMPRESSION: 1. Unchanged small ascites. Electronically Signed   By: Titus Dubin M.D.   On: 11/06/2018 16:40   Dg Intro Donald Pore Tube  Result Date: 11/07/2018 CLINICAL DATA:  Hepatic encephalopathy, need for medication administration and feeding. EXAM: INTRO LONG GI TUBE FLUOROSCOPY TIME:  Fluoroscopy Time:  1 minutes, 52 seconds Radiation Exposure Index (if provided by the fluoroscopic device): 28.9 mGy Number of Acquired Spot Images: 0 COMPARISON:  None. FINDINGS: With the patient gently held due to combativeness, and after use of viscous  lidocaine on the right nostril, a feeding tube was advanced through the right nostril into the stomach under fluoroscopic observation. A variety of attempts and maneuvers were made, including use of a Amplatz super stiff Glidewire, to advance the tube past the pylorus, without further success. After attempts for further advancement were made, the tube was taped in place with the tip in the vicinity of the stomach antrum. IMPRESSION: 1. Fluoroscopically guided placement of a  feeding tube. The tip of the feeding tube is in the stomach antrum. Electronically Signed   By: Van Clines M.D.   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  Patient has completed treatment for hepatitis C with Epclusa 04/2018 with sustained viral response  #2 hepatocellular carcinoma, multifocal-that is post thermal ablation April 2020.  He is due for follow-up MRI He has had failure to thrive over the past 3 to 4 months with significant weight loss and persistent nausea He was seen in our office on 10/26/2018.  At that time brought in by a friend who was very concerned about his inability to care for himself and multiple falls at home.  She was also concerned about his decline in cognition.  CT of the abdomen and pelvis March 2020 just prior to ablation had shown aggression of upper abdominal adenopathy in addition to the multifocal [place]  MRI of the abdomen had been ordered but not done as yet.  Need to rule out progression of multifocal hepatocellular cancer and/or metastatic disease.  #3 patient admitted 3 days ago with altered mental status found at home unresponsive.  Awakened and rambling uncooperative in ER. EtOH level negative on admission Ammonia level not elevated Etiology of confusion persistent altered mental status not completely clear.  Suspect component of hepatic encephalopathy, though doubt as etiology alone especially with normal ammonia.  Rule out metastatic disease to the brain, rule out EtOH withdrawal. Also notes significantly elevated sed rate of 130-question secondary to hepatocellular CA versus other ?  Patient remains agitated and confused pulled NG tube. He is on CIWA protocol-Ativan does not seem to be effective.  Consider Haldol.  will continue lactulose enemas leave NG tube out for now  Patient needs further imaging of the head with MRI  #4 adult  onset diabetes mellitus #5 anemia chronic  Plan; Discussed with Dr. Loletha Carrow and hospitalist.  Plan is for MRI of the head and abdomen which will require general anesthesia, but felt important to get a  definite diagnosis, and help with goals of care etc.  Certainly palliative care consultation is appropriate and will be helpful.  I believe he has a brother, male friend who brings him to appointments though not clear that anyone has power of attorney. Ultimately he does have hepatocellular carcinoma which is not going to be curative he has been unable to care for himself at home , and will require placement at discharge.  GI will follow peripherally     Principal Problem:   Symptomatic anemia Active Problems:   Diabetes mellitus ([place])   Alcohol dependence ([place])   Altered mental status     LOS: 3 days   Amy EsterwoodPA-C 11/08/2018, 8:52 AM   I have discussed the case with the PA, and that is the plan I formulated. I personally interviewed and examined the patient.  CC: Decompensated alcohol-related cirrhosis  This patient is no better  than he was yesterday, with persistent delirium and stupor.  He was unable to get an MRI done.  EEG findings as noted above.  Nasogastric tube was placed in radiology, only to have the patient pulled out last night despite being in restraints.  He still does not have overt GI bleeding, no plans for upper endoscopy at present in this condition.  As noted above, he has known multifocal hepatoma, status of which is unknown at present without abdominal imaging in last 3 months.  I discussed this at length with Dr. Maylene Roes, the [facility] hospitalist assuming this patient's care today. It is time for a palliative care discussion with any identifiable next of kin for this patient.  I note that his ESR is 136 three days ago, cause of which is unknown.  I do not know if a metastatic malignancy could be expected to cause this degree of elevation.  It raises suspicion  of something like a vasculitis.  Unfortunately, MR imaging of the head has not been feasible due to his altered mental status.  Even if something like that were identifiable and treatable, he would still have underlying severe terminal liver disease and malignancy.  The best we can do as far as treating the component of hepatic encephalopathy at present is to try and use lactulose enemas.  Patient has not been consistently awake enough to safely take oral nutrition or medicines.  Goals of care must be addressed.  As we have no further studies or events to offer at this point, we will sign off on this patient. Feel free to call as the need arises.  Total time 28 minutes, chart review required and conversations with care team.  Nelida Meuse III Office: [phone]

## 2018-11-08 NOTE — Progress Notes (Signed)
Patient pleasant upon shift, sleeping, No PRN given. Will attempt to remove restraints and see how patient does. Will continue to monitor.

## 2018-11-09 DIAGNOSIS — E1100 Type 2 diabetes mellitus with hyperosmolarity without nonketotic hyperglycemic-hyperosmolar coma (NKHHC): Secondary | ICD-10-CM

## 2018-11-09 DIAGNOSIS — Z515 Encounter for palliative care: Secondary | ICD-10-CM

## 2018-11-09 DIAGNOSIS — Z7189 Other specified counseling: Secondary | ICD-10-CM

## 2018-11-09 DIAGNOSIS — Z66 Do not resuscitate: Secondary | ICD-10-CM

## 2018-11-09 LAB — BASIC METABOLIC PANEL
Anion gap: 8 (ref 5–15)
BUN: 12 mg/dL (ref 8–23)
CO2: 25 mmol/L (ref 22–32)
Calcium: 7.8 mg/dL — ABNORMAL LOW (ref 8.9–10.3)
Chloride: 103 mmol/L (ref 98–111)
Creatinine, Ser: 0.63 mg/dL (ref 0.61–1.24)
GFR calc Af Amer: >60 mL/min (ref >60)
GFR calc non Af Amer: >60 mL/min (ref >60)
Glucose, Bld: 115 mg/dL — ABNORMAL HIGH (ref 70–99)
Potassium: 3.8 mmol/L (ref 3.5–5.1)
Sodium: 136 mmol/L (ref 135–145)

## 2018-11-09 LAB — CBC
HCT: 27 % — ABNORMAL LOW (ref 39.0–52.0)
Hemoglobin: 8.6 g/dL — ABNORMAL LOW (ref 13.0–17.0)
MCH: 31.2 pg (ref 26.0–34.0)
MCHC: 31.9 g/dL (ref 30.0–36.0)
MCV: 97.8 fL (ref 80.0–100.0)
Platelets: 206 10*3/uL (ref 150–400)
RBC: 2.76 MIL/uL — ABNORMAL LOW (ref 4.22–5.81)
RDW: 18.6 % — ABNORMAL HIGH (ref 11.5–15.5)
WBC: 14 10*3/uL — ABNORMAL HIGH (ref 4.0–10.5)
nRBC: 0 % (ref 0.0–0.2)

## 2018-11-09 LAB — GLUCOSE, CAPILLARY
Glucose-Capillary: 121 mg/dL — ABNORMAL HIGH (ref 70–99)
Glucose-Capillary: 113 mg/dL — ABNORMAL HIGH (ref 70–99)
Glucose-Capillary: 117 mg/dL — ABNORMAL HIGH (ref 70–99)
Glucose-Capillary: 114 mg/dL — ABNORMAL HIGH (ref 70–99)

## 2018-11-09 LAB — AMMONIA: Ammonia: 28 umol/L (ref 9–35)

## 2018-11-09 MED ORDER — MORPHINE SULFATE (PF) 2 MG/ML IV SOLN
2.0000 mg | INTRAVENOUS | Status: DC | PRN
  Administered 2018-11-09 – 2018-11-11 (×5): 2 mg via INTRAVENOUS
  Filled 2018-11-09 (×5): qty 1

## 2018-11-09 MED ORDER — ZOLPIDEM TARTRATE 5 MG PO TABS: 5.0000 mg | ORAL_TABLET | Freq: Once | ORAL | Status: DC

## 2018-11-09 MED ORDER — GLYCOPYRROLATE 0.2 MG/ML IJ SOLN
0.3000 mg | INTRAMUSCULAR | Status: DC | PRN
  Filled 2018-11-09: qty 1.5

## 2018-11-09 MED ORDER — LORAZEPAM 2 MG/ML IJ SOLN
1.0000 mg | INTRAMUSCULAR | Status: DC
  Administered 2018-11-09 – 2018-11-11 (×10): 1 mg via INTRAVENOUS
  Filled 2018-11-09 (×10): qty 1

## 2018-11-09 MED ORDER — BIOTENE DRY MOUTH MT LIQD: 15.0000 mL | OROMUCOSAL | Status: DC | PRN

## 2018-11-09 MED ORDER — MORPHINE SULFATE (PF) 2 MG/ML IV SOLN
1.0000 mg | INTRAVENOUS | Status: AC | PRN
  Administered 2018-11-09 (×2): 1 mg via INTRAVENOUS
  Filled 2018-11-09 (×2): qty 1

## 2018-11-09 MED ORDER — POLYVINYL ALCOHOL 1.4 % OP SOLN
1.0000 [drp] | Freq: Four times a day (QID) | OPHTHALMIC | Status: DC | PRN
  Filled 2018-11-09: qty 15

## 2018-11-09 NOTE — Progress Notes (Signed)
PROGRESS NOTE    [name]  ZOX:[id number] DOB: 1955-11-28 DOA: 11/04/2018 PCP: Marliss Coots, NP     Brief Narrative:  [name] a62 y.o.male,with past medical history significant for DM type 2 w neuropathy, alcohol dependency, Hep C cirrhosis w esophageal varices, hepatocellular carcinoma, tobacco abuse, apparently was brought by EMS due to confusion and high blood sugar reading.  Patient was unable to give a good history.  In the ED, patient vital signs were fairly stable, CT brain with no acute intracranial hemorrhage.  Patient's glucose was noted to be 533.  COVID-19 test done was negative. Patient admitted for acute encephalopathy.  GI consulted.   New events last 24 hours / Subjective: Restless and agitated overnight, not sleeping, yelling out. This morning, he appears more calm, but in restraints. He is oriented to self only. Does not follow commands, but is more verbal today than he was yesterday. Has been having BM with lactulose enema.   Assessment & Plan:   Principal Problem:   Symptomatic anemia Active Problems:   Diabetes mellitus (HCC)   Alcohol dependence ([place])   Altered mental status   Acute encephalopathy, multifactorial in nature, concern for hepatic encephalopathy as well as possibly Warnicke's encephalopathy and ?metastatic disease -Patient's initial ammonia level 37 -CT head showed no acute intracranial hemorrhage -EEG showed general background slowing.  No epileptiform activity is noted -He has been given lactulose enema as well as lactulose by NG tube.  NG tube was removed overnight by patient 7/1  -Discussed with GI, concern for etiology of his altered mental status.  Suspect component of hepatic encephalopathy, although doubt as primary etiology.  Need to rule out possibly metastatic disease to the brain, also question vasculitis with elevated sed rate of 130. -MRI brain unable to be completed due to patient's agitation.  If this is  pursued, will need general anesthesia at [facility] lactulose enema twice daily.  Rifaximin when able to take p.o. -Continue high-dose IV thiamine  History of alcohol abuse -Ex-wife/friend states that patient has not had alcohol intake in 3 years  HCAP -Chest x-ray showed increased density at the left base which may represent an early infiltrate. Continue IV Zosyn  Normocytic anemia -Hemoglobin on admission 6.4, transfused 1UPRBC---> 8. Continue PPI  Hyperosmolar hyperglycemic state/Hx of diabetes mellitus type 2 -BS on admission >500, no gap, acidosis, no ketones -Continue sliding scale insulin. Blood sugar well controlled   Hyponatremia -Resolved  Hx of chronic Hep C/cirrhosis/HCC -Follows with GI -Completed treatment for Hep C with a 12-week course of Epclusa in 04/2018 -Noted to have multifocal hepatocellular carcinoma, atypical cells noted in peritoneal fluid on cytology in 05/2018.  Patient is status post thermal ablation of the 2 lesions in 09/06/2018 -Recommend MRI abdomen to rule out progression of multifocal hepatocellular carcinoma and/or metastatic disease.  Unfortunately due to patient's agitation, unable to get MRI done with IV sedation.  If this is to be pursued, will need general anesthesia at [facility]  Failure to thrive -Over the past 3 to 4 months with significant weight loss  Fall risk -Over the past 3 to 4 months, has had multiple falls at home  Goals of care -Patient has unexplained etiology of his altered mentation. Question possibly metastatic disease, vasculitis, vs other with hepatic encephalopathy. If MRI brain and MRI abdomen are pursued, would need to be under general anesthesia at [facility] due to patient's agitation and intolerance for anti-anxiolytic.  Patient has had decline  over the past 3 to 4 months with poor quality of life at baseline.  Spoke at length with patient's friend Arbie Cookey who is his ex-wife.  She  states that she is his only family/friend who was involved in his life currently.  He does have 2 brothers and 3 children who are not involved and patient has not spoken with his adult children in over 20 years. Parents are deceased. She does not have formal healthcare power of attorney paperwork on file.  We discussed options for general anesthesia and MRI to find out etiology, although unclear if and what treatments would be possible for patient. Also there is risk of general anesthesia due to his poor baseline mentation currently. The other option would be to pursue palliative care/hospice.  Arbie Cookey will speak with patient's brothers and children and see if they would like to be involved at this point. Palliative care consulted.    DVT prophylaxis: SCD Code Status: Full Family Communication: None Disposition Plan: Palliative care planning family meeting this afternoon for goals of care discussion    Consultants:   GI, signed off 7/1   Palliative care  Procedures:   None   Antimicrobials:  Anti-infectives (From admission, onward)   Start     Dose/Rate Route Frequency Ordered Stop   11/05/18 1800  piperacillin-tazobactam (ZOSYN) IVPB 3.375 g     3.375 g 12.5 mL/hr over 240 Minutes Intravenous Every 8 hours 11/05/18 1608     11/05/18 1400  cefTRIAXone (ROCEPHIN) 1 g in sodium chloride 0.9 % 100 mL IVPB  Status:  Discontinued     1 g 200 mL/hr over 30 Minutes Intravenous Every 24 hours 11/05/18 1142 11/05/18 1603   11/05/18 1400  rifaximin (XIFAXAN) tablet 550 mg     550 mg Oral 2 times daily 11/05/18 1142         Objective: Vitals:   11/08/18 1345 11/08/18 2110 11/09/18 0358 11/09/18 0406  BP: (!) 153/92 (!) 173/88 (!) 164/87   Pulse: 98 96 (!) 104   Resp: _0 Temp: (!) 97.3 F (36.3 C) 97.6 F (36.4 C) 97.9 F (36.6 C)   TempSrc: Oral     SpO2: 97% 99% 99%   Weight:    67.4 kg  Height:        Intake/Output Summary (Last 24 hours) at 11/09/2018 0941 Last data  filed at 11/09/2018 0928 Gross per 24 hour  Intake 403 ml  Output 1700 ml  Net -1297 ml   Filed Weights   11/07/18 0500 11/08/18 0519 11/09/18 0406  Weight: 65.9 kg 67.2 kg 67.4 kg    Examination: General exam: Appears calm and comfortable, in wrist restraints  Respiratory system: Clear to auscultation. Respiratory effort normal. Cardiovascular system: S1 & S2 heard, RRR. No JVD, murmurs, rubs, gallops or clicks. No pedal edema. Gastrointestinal system: Abdomen is nondistended, soft and nontender. No organomegaly or masses felt. Normal bowel sounds heard. Central nervous system: Alert and oriented to self only, does not follow commands appropriately  Extremities: Symmetric Skin: No rashes, lesions or ulcers   Data Reviewed: I have personally reviewed following labs and imaging studies  CBC: Recent Labs  Lab 11/04/18 1845 11/05/18 1035 11/06/18 0818 11/07/18 0559 11/08/18 0541 11/09/18 0644  WBC 10.8* 12.3* 8.3 12.6* 12.6* 14.0*  NEUTROABS 7.6  --  5.1 8.0* 9.4*  --   HGB 6.4* 8.0* 8.1* 9.6* 8.5* 8.6*  HCT 19.8* 24.5* 25.7* 28.6* 25.6* 27.0*  MCV 95.7 95.7 100.0 93.5  97.3 97.8  PLT 199 226 PLATELET CLUMPS NOTED ON SMEAR, UNABLE TO ESTIMATE 211 218 782   Basic Metabolic Panel: Recent Labs  Lab 11/04/18 1845 11/05/18 0621 11/07/18 0559 11/08/18 0541 11/09/18 0644  NA 127* 132* 135 135 136  K 4.4 4.1 4.1 4.0 3.8  CL 88* 94* 98 100 103  CO2 33* _0 GLUCOSE 533* 299* 125* 191* 115*  BUN 25* _1 CREATININE 0.95 0.65 0.68 0.72 0.63  CALCIUM 7.8* 7.7* 8.0* 7.6* 7.8*   GFR: Estimated Creatinine Clearance: 92.4 mL/min (by C-G formula based on SCr of 0.63 mg/dL). Liver Function Tests: Recent Labs  Lab 11/04/18 1845 11/05/18 0621  AST 31 33  ALT 14 15  ALKPHOS 144* 134*  BILITOT 1.0 1.2  PROT 6.0* 5.9*  ALBUMIN 1.6* 1.6*   Recent Labs  Lab 11/04/18 1845  LIPASE 21   Recent Labs  Lab 11/05/18 0621 11/09/18 0644  AMMONIA 37* 28    Coagulation Profile: Recent Labs  Lab 11/05/18 1209 11/06/18 0818  INR 1.2 1.2   Cardiac Enzymes: No results for input(s): CKTOTAL, CKMB, CKMBINDEX, TROPONINI in the last 168 hours. BNP (last 3 results) No results for input(s): PROBNP in the last 8760 hours. HbA1C: No results for input(s): HGBA1C in the last 72 hours. CBG: Recent Labs  Lab 11/08/18 1557 11/08/18 2114 11/08/18 2336 11/09/18 0400 11/09/18 0730  GLUCAP 90 131* 117* 121* 113*   Lipid Profile: No results for input(s): CHOL, HDL, LDLCALC, TRIG, CHOLHDL, LDLDIRECT in the last 72 hours. Thyroid Function Tests: No results for input(s): TSH, T4TOTAL, FREET4, T3FREE, THYROIDAB in the last 72 hours. Anemia Panel: No results for input(s): VITAMINB12, FOLATE, FERRITIN, TIBC, IRON, RETICCTPCT in the last 72 hours. Sepsis Labs: Recent Labs  Lab 11/05/18 1255 11/05/18 1501  LATICACIDVEN 1.9 1.4    Recent Results (from the past 240 hour(s))  SARS Coronavirus 2 (CEPHEID - Performed in [facility] hospital lab), Hosp Order     Status: None   Collection Time: 11/04/18 11:05 PM   Specimen: Nasopharyngeal Swab  Result Value Ref Range Status   SARS Coronavirus 2 NEGATIVE NEGATIVE Final    Comment: (NOTE) If result is NEGATIVE SARS-CoV-2 target nucleic acids are NOT DETECTED. The SARS-CoV-2 RNA is generally detectable in upper and lower  respiratory specimens during the acute phase of infection. The lowest  concentration of SARS-CoV-2 viral copies this assay can detect is 250  copies / mL. A negative result does not preclude SARS-CoV-2 infection  and should not be used as the sole basis for treatment or other  patient management decisions.  A negative result may occur with  improper specimen collection / handling, submission of specimen other  than nasopharyngeal swab, presence of viral mutation(s) within the  areas targeted by this assay, and inadequate number of viral copies  (<250 copies / mL). A negative result  must be combined with clinical  observations, patient history, and epidemiological information. If result is POSITIVE SARS-CoV-2 target nucleic acids are DETECTED. The SARS-CoV-2 RNA is generally detectable in upper and lower  respiratory specimens dur ing the acute phase of infection.  Positive  results are indicative of active infection with SARS-CoV-2.  Clinical  correlation with patient history and other diagnostic information is  necessary to determine patient infection status.  Positive results do  not rule out bacterial infection or co-infection with other viruses. If result is PRESUMPTIVE POSTIVE SARS-CoV-2 nucleic acids MAY BE PRESENT.  A presumptive positive result was obtained on the submitted specimen  and confirmed on repeat testing.  While 2019 novel coronavirus  (SARS-CoV-2) nucleic acids may be present in the submitted sample  additional confirmatory testing may be necessary for epidemiological  and / or clinical management purposes  to differentiate between  SARS-CoV-2 and other Sarbecovirus currently known to infect humans.  If clinically indicated additional testing with an alternate test  methodology [phone]) is advised. The SARS-CoV-2 RNA is generally  detectable in upper and lower respiratory sp ecimens during the acute  phase of infection. The expected result is Negative. Fact Sheet for Patients:  StrictlyIdeas.no Fact Sheet for Healthcare Providers: BankingDealers.co.za This test is not yet approved or cleared by the [place] FDA and has been authorized for detection and/or diagnosis of SARS-CoV-2 by FDA under an Emergency Use Authorization (EUA).  This EUA will remain in effect (meaning this test can be used) for the duration of the COVID-19 declaration under Section 564(b)(1) of the Act, 21 U.S.C. section 360bbb-3(b)(1), unless the authorization is terminated or revoked sooner. Performed at [facility], [place] [address], [place], [place] [address]   MRSA PCR Screening     Status: None   Collection Time: 11/05/18 12:04 AM   Specimen: Nasal Mucosa; Nasopharyngeal  Result Value Ref Range Status   MRSA by PCR NEGATIVE NEGATIVE Final    Comment:        The GeneXpert MRSA Assay (FDA approved for NASAL specimens only), is one component of a comprehensive MRSA colonization surveillance program. It is not intended to diagnose MRSA infection nor to guide or monitor treatment for MRSA infections. Performed at [facility], [place] [address], [place], [place] [address]   Culture, blood (routine x 2)     Status: None (Preliminary result)   Collection Time: 11/05/18 12:31 PM   Specimen: BLOOD  Result Value Ref Range Status   Specimen Description BLOOD LEFT ANTECUBITAL  Final   Special Requests   Final    BOTTLES DRAWN AEROBIC ONLY Blood Culture adequate volume   Culture   Final    NO GROWTH 4 DAYS Performed at [facility], [place] [address] [place], [place] [address]    Report Status PENDING  Incomplete  Culture, blood (routine x 2)     Status: None (Preliminary result)   Collection Time: 11/05/18 12:40 PM   Specimen: BLOOD  Result Value Ref Range Status   Specimen Description   Final    BLOOD RIGHT ARM Performed at [place] [address] [place], [place] [address]    Special Requests   Final    BOTTLES DRAWN AEROBIC AND ANAEROBIC Blood Culture adequate volume Performed at [place] [address], [place], [place] [address]    Culture   Final    NO GROWTH 4 DAYS Performed at [facility], [place] [address], [place], [place] [address]    Report Status PENDING  Incomplete       Radiology Studies: Dg Ander Gaster Tube  Result Date: 11/07/2018 CLINICAL DATA:  Hepatic encephalopathy, need for medication administration and feeding. EXAM: INTRO LONG GI TUBE FLUOROSCOPY TIME:  Fluoroscopy  Time:  1 minutes, 52 seconds Radiation Exposure Index (if provided by the fluoroscopic device): 28.9 mGy Number of Acquired Spot Images: 0 COMPARISON:  None. FINDINGS: With the patient gently held due to combativeness, and after use of viscous lidocaine on the right nostril, a feeding  tube was advanced through the right nostril into the stomach under fluoroscopic observation. A variety of attempts and maneuvers were made, including use of a Amplatz super stiff Glidewire, to advance the tube past the pylorus, without further success. After attempts for further advancement were made, the tube was taped in place with the tip in the vicinity of the stomach antrum. IMPRESSION: 1. Fluoroscopically guided placement of a feeding tube. The tip of the feeding tube is in the stomach antrum. Electronically Signed   By: Van Clines M.D.   On: 11/07/2018 16:33      Scheduled Meds:  feeding supplement (PRO-STAT SUGAR FREE 64)  30 mL Oral BID   folic acid  1 mg Oral Daily   insulin aspart  0-9 Units Subcutaneous Q4H   lactulose  300 mL Rectal BID   mouth rinse  15 mL Mouth Rinse BID   multivitamin with minerals  1 tablet Oral Daily   pantoprazole (PROTONIX) IV  40 mg Intravenous Q12H   rifaximin  550 mg Oral BID   sodium chloride flush  3 mL Intravenous Q12H   zolpidem  5 mg Oral Once   Continuous Infusions:  sodium chloride     piperacillin-tazobactam (ZOSYN)  IV 3.375 g (11/08/18 1726)   thiamine injection 250 mg (11/09/18 0925)   thiamine injection Stopped (11/08/18 2325)     LOS: 4 days    Time spent: 25 minutes   Dessa Phi, DO [facility] [email or web address] 11/09/2018, 9:41 AM

## 2018-11-09 NOTE — Progress Notes (Signed)
Non-violent restraint was discontinue after having a comfort care order. Medication PRN was given. Patient does not have any injury or skin issue after release  Restraint. Family was notified and updated at bedside.

## 2018-11-09 NOTE — Progress Notes (Signed)
Patient still agitated, moaning, yelling out, and restless in bed. Saying please, please. Other patients complaining. Nothing ordered for pain. Haldol ineffective. MD on call paged. Ambien ordered. Patient NPO unable to take PO. Patient still needs speech eval. Will try to reposition patient and apply heat to back. Tech stated she heard patient complain of back pain. Will continue to monitor.
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
## 2018-11-09 NOTE — Progress Notes (Signed)
[facility] RN earlier called and stated pt was restless, and not sleeping and yelling out.  Pt stated that he "wanted to get up " per his voice in the background.  There was no mention of pain at that time .  Apparently family member thinks that he is in pain at the current time.  She states that she discussed with Dr. Maylene Roes about making him CMO and requests pain medication be given.  Family advised that this has potential to make his confusion worse. Family understands this and requests that we administer pain medication. Morphine sulfate 35m iv q3h up to 2 doses ordered.

## 2018-11-09 NOTE — Progress Notes (Signed)
Per [facility], patient family member Arbie Cookey will be allowed to visit patient.

## 2018-11-10 LAB — CULTURE, BLOOD (ROUTINE X 2)
Culture: NO GROWTH
Culture: NO GROWTH
Special Requests: ADEQUATE
Special Requests: ADEQUATE

## 2018-11-10 NOTE — Progress Notes (Signed)
Nutrition Brief Note  Chart reviewed. Pt now transitioning to comfort care.  No further nutrition interventions warranted at this time.   Clayton Bibles, MS, RD, [place] Dietitian Pager: [phone] After Hours Pager: [phone]

## 2018-11-10 NOTE — Progress Notes (Signed)
Palliative Medicine RN Note: Rec'd a call from pt's ex-wife [name] [phone]) asking about visitation and about hospice referral.  1. We discussed the visitation policy for the hospital system & the limitations on changing who the named visitors are. She asked about changing the names on the list if his estranged sons decide to come say good bye; I explained that, unfortunately, I cannot give authorization for that.  2. We discussed referral to residential hospice. She would like [facility]. I spoke with liaison Anderson Malta; she will call [name] about hospice care and to explain their visitation policy. Anderson Malta reports that she does not know at this moment if pt will be able to transfer tonight or if he will have to wait until tomorrow to get a bed.  Marjie Skiff Dinia Joynt, RN, BSN, [facility] Palliative Medicine Team 11/10/2018 3:44 PM Office [phone]

## 2018-11-10 NOTE — Progress Notes (Signed)
Daily Progress Note   Patient Name: [name]       Date: 11/10/2018 DOB: July 01, 1955  Age: 63 y.o. MRN#: [id number] Attending Physician: Dessa Phi, DO Primary Care Physician: Marliss Coots, NP Admit Date: 11/04/2018  Reason for Consultation/Follow-up: Establishing goals of care  Subjective: Patient somnolent. Appears comfortable in no obvious distress. No family at the bedside. Receiving scheduled and PRN medications for symptom management and comfort. Restraints were able to be removed on yesterday.   Called and left voicemail with Arbie Cookey (ex-wife) to provide updates. Patient is stable and appropriate for hospice transfer once approved and bed is available.   Length of Stay: 5  Current Medications: Scheduled Meds:   LORazepam  1 mg Intravenous Q4H    Continuous Infusions:   PRN Meds: albuterol, antiseptic oral rinse, glycopyrrolate, haloperidol lactate, morphine injection, polyvinyl alcohol, promethazine  Physical Exam         GENERAL: NAD, somnolent, comfort care  RESPIRATORY: diminished, clear, no use of accessory muscles CARDIOVASCULAR: RRR, S1, S2  Vital Signs: BP (!) 183/99 (BP Location: Right Arm)    Pulse (!) 107    Temp 97.9 F (36.6 C)    Resp 16    Ht 6' (1.829 m)    Wt 67.4 kg    SpO2 100%    BMI 20.14 kg/m  SpO2: SpO2: 100 % O2 Device: O2 Device: Room Air O2 Flow Rate:    Intake/output summary:   Intake/Output Summary (Last 24 hours) at 11/10/2018 1214 Last data filed at 11/10/2018 0500 Gross per 24 hour  Intake 141.83 ml  Output 825 ml  Net -683.17 ml   LBM: Last BM Date: 11/09/18 Baseline Weight: Weight: 71.5 kg Most recent weight: Weight: 67.4 kg       Palliative Assessment/Data: FULL COMFORT   Patient Active Problem List   Diagnosis  Date Noted   Altered mental status 11/05/2018   Symptomatic anemia 11/04/2018   Cancer, hepatocellular ([place]) 29/79/8921   Alcoholic cirrhosis of liver with ascites (HCC)    Hepatocellular carcinoma (HCC)    Elevated CEA    Secondary esophageal varices without bleeding (HCC)    Liver mass 05/31/2018   Neuropathy 05/31/2018   Hypomagnesemia 05/31/2018   Anemia 05/31/2018   Hypoglycemia 05/31/2018   Type 2 diabetes mellitus ([place]) 05/31/2018  Leukocytosis 05/31/2018   Tobacco use 05/31/2018   Lung nodule < 6cm on CT 05/31/2018   Chronic hepatitis C ([place]) 01/11/2018   Alcohol-induced mood disorder ([place]) 10/31/2015   Moderate major depression ([place]) 04/21/2015   Major depressive disorder, single episode, moderate ([place]) 03/10/2015   Alcohol dependence with uncomplicated withdrawal ([place]) 03/03/2015   Alcohol abuse    Suicidal ideation    Suicidal ideation 11/20/2014   Anxiousness 09/11/2013   Substance induced mood disorder ([place]) 06/20/2013   S/P alcohol detoxification 09/27/2012   Alcohol dependence ([place]) 01/29/2012   Type II or unspecified type diabetes mellitus without mention of complication, uncontrolled 11/11/2011   Thrombocytopenia ([place])    Hyperglycemia    Alcoholic (HCC)    Chronic hyponatremia    Alcohol dependence with acute alcoholic intoxication ([place]) 11/06/2011   Diabetes mellitus ([place]) 11/06/2011   Hepatitis C 11/06/2011    Palliative Care Assessment & Plan   Patient Profile: Palliative Care consult requested for this 63 y.o. male with multiple medical problems including diabetes, neuropathy, substance abuse (alcohol and marijuana), Hepatitis C, liver cirrhosis with esophageal varices, hepatocellular carcinoma, thrombocytopenia, tobacco use, depression, and anxiety. He presented to the ED after neighbor called due to confusion and elevated blood sugar. CT brain showed no acute hemorrhage, age related atrophy and chronic  microvascular ischemic changes. Blood sugar was 533, Albumin 1.6, hgb 6.4, ammonia 37.   Recommendations/Plan:  DNR/DNI  Full Comfort  Potential transfer to hospice home once referral approved and bed available. Family requesting [employer] [facility] as choice.   Appears comfortable, no changes to medication regimen  PMT will continue to follow and support  Goals of Care and Additional Recommendations:  Limitations on Scope of Treatment: Full Comfort Care  Code Status:    Code Status Orders  (From admission, onward)         Start     Ordered   11/09/18 1552  Do not attempt resuscitation (DNR)  Continuous    Question Answer Comment  In the event of cardiac or respiratory ARREST Do not call a code blue   In the event of cardiac or respiratory ARREST Do not perform Intubation, CPR, defibrillation or ACLS   In the event of cardiac or respiratory ARREST Use medication by any route, position, wound care, and other measures to relive pain and suffering. May use oxygen, suction and manual treatment of airway obstruction as needed for comfort.      11/09/18 1557        Code Status History    Date Active Date Inactive Code Status Order ID Comments User Context   11/09/2018 1550 11/09/2018 1557 DNR [id number]  Jimmy Footman, NP Inpatient   11/04/2018 2213 11/09/2018 1550 Full Code [id number]  Jani Gravel, MD ED   05/31/2018 1428 06/02/2018 2317 Full Code [id number]  Reubin Milan, MD Inpatient   11/03/2015 0434 11/03/2015 1417 Full Code [id number]  Palumbo, April, MD ED   10/31/2015 1823 11/01/2015 1853 Full Code [id number]  Patrecia Pour, NP Inpatient   10/30/2015 1859 10/31/2015 1823 Full Code [id number]  Davonna Belling, MD ED   04/21/2015 1806 04/24/2015 1551 Full Code [id number]  Patrecia Pour, NP Inpatient   04/21/2015 1235 04/21/2015 1806 Full Code [id number]  Quintella Reichert, MD ED   03/04/2015 1910 03/11/2015 1459 Full Code [id number]  Marjie Skiff, MD Inpatient    03/02/2015 2345 03/04/2015 1910 Full Code [id number]  Sheilah Pigeon ED   12/01/2014 0650 12/02/2014 2205 Full Code [id number]  Palumbo, April, MD ED   11/20/2014 0600 11/23/2014 1751 Full Code [id number]  Charlann Lange, PA-C ED   01/15/2014 0514 01/15/2014 2058 Full Code [id number]  Palumbo-Rasch, April K, MD ED   10/31/2013 1158 11/05/2013 1507 Full Code [id number]  Encarnacion Slates, NP Inpatient   10/31/2013 0127 10/31/2013 1158 Full Code [id number]  Beverely Pace ED   10/30/2013 0455 10/30/2013 0909 Full Code [id number]  Evelina Bucy, MD ED   09/12/2013 1058 09/16/2013 1729 Full Code [id number]  Earleen Newport, NP Inpatient   09/11/2013 1405 09/12/2013 1058 Full Code [id number]  Elwyn Lade, PA-C ED   08/24/2013 1348 08/25/2013 0136 Full Code [id number]  Alvina Chou, PA-C ED   06/19/2013 1314 06/20/2013 0114 Full Code [id number]  Elwyn Lade, PA-C ED   10/13/2012 0712 10/13/2012 2032 Full Code [id number]  Johnna Acosta, MD ED   09/27/2012 0739 09/27/2012 2332 Full Code [id number]  Blanchie Dessert, MD ED   01/27/2012 1935 01/29/2012 0547 Full Code [id number]  Leota Jacobsen, MD ED   01/09/2012 1712 01/10/2012 1917 Full Code [id number]  Julieta Bellini, NP ED   12/21/2011 0725 12/22/2011 0052 Full Code [id number]  Kalman Drape, MD ED   11/05/2011 0516 11/06/2011 1658 Full Code [id number]  Dammen, Ruthell Rummage, PA ED   Advance Care Planning Activity      Prognosis:   < 2 weeks  Discharge Planning:  Hospice facility  Thank you for allowing the Palliative Medicine Team to assist in the care of this patient.  Total Time: 20 min.   Greater than 50%  of this time was spent counseling and coordinating care related to the above assessment and plan.  Alda Lea, AGPCNP-BC Palliative Medicine Team  Phone: [phone] Pager: [phone] Amion: Bjorn Pippin    Please contact Palliative Medicine Team phone at [phone] for questions and concerns.

## 2018-11-10 NOTE — Progress Notes (Addendum)
Manufacturing engineer [facility])     Received referral for [employer] [facility] from [place] with PMT.    Confirmed interest with Alpha Gula, NOK and ex wife. Answered all questions that she had at the moment.  [facility] will have a bed to offer Mr. Misiaszek in the morning.  ACC RN will reach out to [facility] team on 7/4 once necessary consents are obtained, so transport can then be arranged.  Thank you, Venia Carbon RN, BSN, [place] [facility] Liaison (in [place]) [phone]

## 2018-11-10 NOTE — Progress Notes (Signed)
PROGRESS NOTE    [name]  GDJ:[id number] DOB: [birth year] DOA: 11/04/2018 PCP: Marliss Coots, NP     Brief Narrative:  [name] a62 y.o.male,with past medical history significant for DM type 2 w neuropathy, alcohol dependency, Hep C cirrhosis w esophageal varices, hepatocellular carcinoma, tobacco abuse, apparently was brought by EMS due to confusion and high blood sugar reading.  Patient was unable to give a good history.  In the ED, patient vital signs were fairly stable, CT brain with no acute intracranial hemorrhage.  Patient's glucose was noted to be 533.  COVID-19 test done was negative. Patient admitted for acute encephalopathy.  GI consulted. Discussed with GI, concern for etiology of his altered mental status.  Suspect component of hepatic encephalopathy, although doubt as primary etiology.  Need to rule out possibly metastatic disease to the brain, also question vasculitis with elevated sed rate of 130. Recommend MRI abdomen to rule out progression of multifocal hepatocellular carcinoma and/or metastatic disease.  Unfortunately due to patient's agitation, unable to get MRI done with IV sedation. If this is pursued, will need general anesthesia at [facility].  Patient remained very agitated requiring restraints.  Palliative care was consulted.  Patient has had significant decline over the past 3 to 6 months with lack of quality of life.  Family ultimately decided to pursue residential hospice for patient with comfort care.  New events last 24 hours / Subjective: Comfortable this morning, somnolent  Assessment & Plan:   Acute encephalopathy, multifactorial in nature, concern for hepatic encephalopathy as well as possibly Warnicke's encephalopathy and ?metastatic disease  History of alcohol abuse  HCAP  Normocytic anemia  Hyperosmolar hyperglycemic state/Hx of diabetes mellitus type 2  Hyponatremia  Hx of chronic Hep C/cirrhosis/HCC  Failure to  thrive  Fall risk   Family pursuing full comfort care.  Residential hospice referral.   Consultants:   GI, signed off 7/1   Palliative care  Procedures:   None   Antimicrobials:  Anti-infectives (From admission, onward)   Start     Dose/Rate Route Frequency Ordered Stop   11/05/18 1800  piperacillin-tazobactam (ZOSYN) IVPB 3.375 g  Status:  Discontinued     3.375 g 12.5 mL/hr over 240 Minutes Intravenous Every 8 hours 11/05/18 1608 11/09/18 1550   11/05/18 1400  cefTRIAXone (ROCEPHIN) 1 g in sodium chloride 0.9 % 100 mL IVPB  Status:  Discontinued     1 g 200 mL/hr over 30 Minutes Intravenous Every 24 hours 11/05/18 1142 11/05/18 1603   11/05/18 1400  rifaximin (XIFAXAN) tablet 550 mg  Status:  Discontinued     550 mg Oral 2 times daily 11/05/18 1142 11/09/18 1550       Objective: Vitals:   11/09/18 0406 11/09/18 1340 11/09/18 2055 11/10/18 0530  BP:  (!) 170/89 (!) 174/94 (!) 183/99  Pulse:  (!) 101 96 (!) 107  Resp:  _0 Temp:  97.8 F (36.6 C) 97.7 F (36.5 C) 97.9 F (36.6 C)  TempSrc:      SpO2:  98% 100% 100%  Weight: 67.4 kg     Height:        Intake/Output Summary (Last 24 hours) at 11/10/2018 1016 Last data filed at 11/10/2018 0500 Gross per 24 hour  Intake 141.83 ml  Output 825 ml  Net -683.17 ml   Filed Weights   11/07/18 0500 11/08/18 0519 11/09/18 0406  Weight: 65.9 kg 67.2 kg 67.4 kg    Examination: General exam: Appears  calm and comfortable, somnolent Respiratory system: Clear to auscultation. Respiratory effort normal. Cardiovascular system: S1 & S2 heard, RRR.   Data Reviewed: I have personally reviewed following labs and imaging studies  CBC: Recent Labs  Lab 11/04/18 1845 11/05/18 1035 11/06/18 0818 11/07/18 0559 11/08/18 0541 11/09/18 0644  WBC 10.8* 12.3* 8.3 12.6* 12.6* 14.0*  NEUTROABS 7.6  --  5.1 8.0* 9.4*  --   HGB 6.4* 8.0* 8.1* 9.6* 8.5* 8.6*  HCT 19.8* 24.5* 25.7* 28.6* 25.6* 27.0*  MCV 95.7 95.7 100.0  93.5 97.3 97.8  PLT 199 226 PLATELET CLUMPS NOTED ON SMEAR, UNABLE TO ESTIMATE 211 218 403   Basic Metabolic Panel: Recent Labs  Lab 11/04/18 1845 11/05/18 0621 11/07/18 0559 11/08/18 0541 11/09/18 0644  NA 127* 132* 135 135 136  K 4.4 4.1 4.1 4.0 3.8  CL 88* 94* 98 100 103  CO2 33* _0 GLUCOSE 533* 299* 125* 191* 115*  BUN 25* _1 CREATININE 0.95 0.65 0.68 0.72 0.63  CALCIUM 7.8* 7.7* 8.0* 7.6* 7.8*   GFR: Estimated Creatinine Clearance: 92.4 mL/min (by C-G formula based on SCr of 0.63 mg/dL). Liver Function Tests: Recent Labs  Lab 11/04/18 1845 11/05/18 0621  AST 31 33  ALT 14 15  ALKPHOS 144* 134*  BILITOT 1.0 1.2  PROT 6.0* 5.9*  ALBUMIN 1.6* 1.6*   Recent Labs  Lab 11/04/18 1845  LIPASE 21   Recent Labs  Lab 11/05/18 0621 11/09/18 0644  AMMONIA 37* 28   Coagulation Profile: Recent Labs  Lab 11/05/18 1209 11/06/18 0818  INR 1.2 1.2   Cardiac Enzymes: No results for input(s): CKTOTAL, CKMB, CKMBINDEX, TROPONINI in the last 168 hours. BNP (last 3 results) No results for input(s): PROBNP in the last 8760 hours. HbA1C: No results for input(s): HGBA1C in the last 72 hours. CBG: Recent Labs  Lab 11/08/18 2336 11/09/18 0400 11/09/18 0730 11/09/18 1125 11/09/18 1609  GLUCAP 117* 121* 113* 117* 114*   Lipid Profile: No results for input(s): CHOL, HDL, LDLCALC, TRIG, CHOLHDL, LDLDIRECT in the last 72 hours. Thyroid Function Tests: No results for input(s): TSH, T4TOTAL, FREET4, T3FREE, THYROIDAB in the last 72 hours. Anemia Panel: No results for input(s): VITAMINB12, FOLATE, FERRITIN, TIBC, IRON, RETICCTPCT in the last 72 hours. Sepsis Labs: Recent Labs  Lab 11/05/18 1255 11/05/18 1501  LATICACIDVEN 1.9 1.4    Recent Results (from the past 240 hour(s))  SARS Coronavirus 2 (CEPHEID - Performed in [facility] hospital lab), Hosp Order     Status: None   Collection Time: 11/04/18 11:05 PM   Specimen: Nasopharyngeal Swab   Result Value Ref Range Status   SARS Coronavirus 2 NEGATIVE NEGATIVE Final    Comment: (NOTE) If result is NEGATIVE SARS-CoV-2 target nucleic acids are NOT DETECTED. The SARS-CoV-2 RNA is generally detectable in upper and lower  respiratory specimens during the acute phase of infection. The lowest  concentration of SARS-CoV-2 viral copies this assay can detect is 250  copies / mL. A negative result does not preclude SARS-CoV-2 infection  and should not be used as the sole basis for treatment or other  patient management decisions.  A negative result may occur with  improper specimen collection / handling, submission of specimen other  than nasopharyngeal swab, presence of viral mutation(s) within the  areas targeted by this assay, and inadequate number of viral copies  (<250 copies / mL). A negative result must be combined with clinical  observations, patient  history, and epidemiological information. If result is POSITIVE SARS-CoV-2 target nucleic acids are DETECTED. The SARS-CoV-2 RNA is generally detectable in upper and lower  respiratory specimens dur ing the acute phase of infection.  Positive  results are indicative of active infection with SARS-CoV-2.  Clinical  correlation with patient history and other diagnostic information is  necessary to determine patient infection status.  Positive results do  not rule out bacterial infection or co-infection with other viruses. If result is PRESUMPTIVE POSTIVE SARS-CoV-2 nucleic acids MAY BE PRESENT.   A presumptive positive result was obtained on the submitted specimen  and confirmed on repeat testing.  While 2019 novel coronavirus  (SARS-CoV-2) nucleic acids may be present in the submitted sample  additional confirmatory testing may be necessary for epidemiological  and / or clinical management purposes  to differentiate between  SARS-CoV-2 and other Sarbecovirus currently known to infect humans.  If clinically indicated additional  testing with an alternate test  methodology [phone]) is advised. The SARS-CoV-2 RNA is generally  detectable in upper and lower respiratory sp ecimens during the acute  phase of infection. The expected result is Negative. Fact Sheet for Patients:  StrictlyIdeas.no Fact Sheet for Healthcare Providers: BankingDealers.co.za This test is not yet approved or cleared by the [place] FDA and has been authorized for detection and/or diagnosis of SARS-CoV-2 by FDA under an Emergency Use Authorization (EUA).  This EUA will remain in effect (meaning this test can be used) for the duration of the COVID-19 declaration under Section 564(b)(1) of the Act, 21 U.S.C. section 360bbb-3(b)(1), unless the authorization is terminated or revoked sooner. Performed at [facility], [place] [address] [place], [place] [address]   MRSA PCR Screening     Status: None   Collection Time: 11/05/18 12:04 AM   Specimen: Nasal Mucosa; Nasopharyngeal  Result Value Ref Range Status   MRSA by PCR NEGATIVE NEGATIVE Final    Comment:        The GeneXpert MRSA Assay (FDA approved for NASAL specimens only), is one component of a comprehensive MRSA colonization surveillance program. It is not intended to diagnose MRSA infection nor to guide or monitor treatment for MRSA infections. Performed at [facility], [place] [address] [place], [place] [address]   Culture, blood (routine x 2)     Status: None   Collection Time: 11/05/18 12:31 PM   Specimen: BLOOD  Result Value Ref Range Status   Specimen Description BLOOD LEFT ANTECUBITAL  Final   Special Requests   Final    BOTTLES DRAWN AEROBIC ONLY Blood Culture adequate volume   Culture   Final    NO GROWTH 5 DAYS Performed at [facility], [place] [address], [place], [place] [address]    Report Status 11/10/2018 FINAL  Final  Culture, blood (routine x 2)     Status: None    Collection Time: 11/05/18 12:40 PM   Specimen: BLOOD  Result Value Ref Range Status   Specimen Description   Final    BLOOD RIGHT ARM Performed at [place] [address], [place], [place] [address]    Special Requests   Final    BOTTLES DRAWN AEROBIC AND ANAEROBIC Blood Culture adequate volume Performed at [place] [address] [place], [place] [address]    Culture   Final    NO GROWTH 5 DAYS Performed at [facility], [place] [address] [place], [place] [address]  Report Status 11/10/2018 FINAL  Final       Radiology Studies: No results found.    Scheduled Meds: . LORazepam  1 mg Intravenous Q4H   Continuous Infusions:    LOS: 5 days    Time spent: 15 minutes   Dessa Phi, DO [facility] [email or web address] 11/10/2018, 10:16 AM

## 2018-11-11 NOTE — Progress Notes (Signed)
[facility] -- Manufacturing engineer ([place]) [place] RN note  Received request from [place] for family interest in [place] with request to transfer when room is available. [facility] is able to offer a room today. Spoke with ex-wife Alpha Gula to confirm interest and explain services. Family agreeable to transfer today. Registration paper work completed. Dr. Orpah Melter to assume care per family request.   Please fax discharge summary to [phone].  RN please call report to [phone].  Please arrange transport for patient to arrive as early in day as possible.  Thank you, Margaretmary Eddy, RN, BSN [place] [phone]  [place] are on AMION under Hospice and [place].

## 2018-11-11 NOTE — TOC Transition Note (Signed)
Transition of Care [facility]) - CM/SW Discharge Note   Patient Details  Name: [name] MRN: [id number] Date of Birth: August 22, 1955  Transition of Care [facility]) CM/SW Contact:  Servando Snare, LCSW Phone Number: 11/11/2018, 10:40 AM   Clinical Narrative:   Patient will transfer to [facility]. LCSW faxed dc summary to facility.   RN report # [phone]      Final next level of care: [place] Barriers to Discharge: No Barriers Identified   Patient Goals and CMS Choice        Discharge Placement              Patient chooses bed at: [facility]) Patient to be transferred to facility by: EMS Name of family member notified: Arbie Cookey Patient and family notified of of transfer: 11/11/18  Discharge Plan and Services                                     Social Determinants of Health (SDOH) Interventions     Readmission Risk Interventions No flowsheet data found.

## 2018-11-11 NOTE — Discharge Summary (Signed)
Physician Discharge Summary  [name]:[id number] DOB: 1955/05/29 DOA: 11/04/2018  PCP: Marliss Coots, NP  Admit date: 11/04/2018 Discharge date: 11/11/2018  Admitted From: Home Disposition:  Residential hospice   Brief/Interim Summary: [name] a63 y.o.male,with past medical history significant for DM type 2 w neuropathy, alcohol dependency, Hep C cirrhosis w esophageal varices, hepatocellular carcinoma, tobacco abuse, apparently was brought by EMS due to confusion and high blood sugar reading. Patient was unable to give a good history. In the ED, patient vital signs were fairly stable, CT brain with no acute intracranial hemorrhage. Patient's glucose was noted to be 533. COVID-19 test done was negative. Patient admitted for acute encephalopathy.  GI consulted. Discussed with GI, concern for etiology of his altered mental status.  Suspect component of hepatic encephalopathy, although doubt as primary etiology.  Need to rule out possibly metastatic disease to the brain, also question vasculitis with elevated sed rate of 130. Recommend MRI abdomen to rule out progression of multifocal hepatocellular carcinoma and/or metastatic disease.  Unfortunately due to patient's agitation, unable to get MRI done with IV sedation. If this is pursued, will need general anesthesia at [facility]  Patient remained very agitated requiring restraints.  Palliative care was consulted.  Patient has had significant decline over the past 3 to 6 months with lack of quality of life.  Family ultimately decided to pursue residential hospice for patient with comfort care.  Discharge Diagnoses:   Acute encephalopathy, multifactorial in nature, concern for hepatic encephalopathy as well as possibly Warnicke's encephalopathy and ?metastatic disease  History of alcohol abuse  HCAP  Normocytic anemia  Hyperosmolar hyperglycemic state/Hx ofdiabetes mellitus type  2  Hyponatremia  Hx of chronic Hep C/cirrhosis/HCC  Failure to thrive  Fall risk   Discharge Instructions   Allergies as of 11/11/2018      Reactions   Benadryl [diphenhydramine Hcl] Other (See Comments)   "Causes nervousness"   Benadryl [diphenhydramine] Other (See Comments)   "makes my skin crawl"   Sulfa Antibiotics Other (See Comments)   Childhood allergy   Tegretol [carbamazepine] Other (See Comments)   "almost killed me"   Librium [chlordiazepoxide Hcl] Anxiety   Librium [chlordiazepoxide] Anxiety   Sulfa Antibiotics Rash      Medication List    STOP taking these medications   albuterol 108 (90 Base) MCG/ACT inhaler Commonly known as: VENTOLIN HFA   furosemide 20 MG tablet Commonly known as: LASIX   gabapentin 400 MG capsule Commonly known as: NEURONTIN   insulin glargine 100 UNIT/ML injection Commonly known as: LANTUS   Lactulose 20 GM/30ML Soln   metFORMIN 1000 MG tablet Commonly known as: GLUCOPHAGE   NONFORMULARY OR COMPOUNDED ITEM   ondansetron 8 MG tablet Commonly known as: ZOFRAN   pantoprazole 40 MG tablet Commonly known as: Protonix   potassium chloride SA 20 MEQ tablet Commonly known as: K-DUR   promethazine 12.5 MG tablet Commonly known as: PHENERGAN   spironolactone 50 MG tablet Commonly known as: ALDACTONE       Allergies  Allergen Reactions  . Benadryl [Diphenhydramine Hcl] Other (See Comments)    "Causes nervousness"  . Benadryl [Diphenhydramine] Other (See Comments)    "makes my skin crawl"  . Sulfa Antibiotics Other (See Comments)    Childhood allergy  . Tegretol [Carbamazepine] Other (See Comments)    "almost killed me"  . Librium [Chlordiazepoxide Hcl] Anxiety  . Librium [Chlordiazepoxide] Anxiety  . Sulfa Antibiotics Rash    Consultations:  GI, signed off  7/1   Palliative care   Procedures/Studies: Ct Head Wo Contrast  Result Date: 11/04/2018 CLINICAL DATA:  63 year old male with altered mental  status. EXAM: CT HEAD WITHOUT CONTRAST TECHNIQUE: Contiguous axial images were obtained from the base of the skull through the vertex without intravenous contrast. COMPARISON:  Head CT dated 04/27/2009 FINDINGS: Brain: Mild to moderate age-related atrophy and chronic microvascular ischemic changes. There is no acute intracranial hemorrhage. No mass effect or midline shift. No extra-axial fluid collection. Vascular: No hyperdense vessel or unexpected calcification. Skull: Normal. Negative for fracture or focal lesion. Sinuses/Orbits: No acute finding. Other: None IMPRESSION: 1. No acute intracranial hemorrhage. 2. Age-related atrophy and chronic microvascular ischemic changes. Electronically Signed   By: Anner Crete M.D.   On: 11/04/2018 22:01   US Abdomen Limited  Result Date: 11/06/2018 CLINICAL DATA:  Ascites. EXAM: LIMITED ABDOMEN ULTRASOUND FOR ASCITES TECHNIQUE: Limited ultrasound survey for ascites was performed in all four abdominal quadrants. COMPARISON:  Abdominal ultrasound from yesterday. FINDINGS: Unchanged small ascites in the right upper and lower quadrants. IMPRESSION: 1. Unchanged small ascites. Electronically Signed   By: Titus Dubin M.D.   On: 11/06/2018 16:40   US Abdomen Limited  Result Date: 11/05/2018 CLINICAL DATA:  Cirrhosis and hepatocellular carcinoma.  Ascites. EXAM: LIMITED ABDOMEN ULTRASOUND FOR ASCITES TECHNIQUE: Limited ultrasound survey for ascites was performed in all four abdominal quadrants. COMPARISON:  None. FINDINGS: A small amount of ascites is seen in both upper quadrants and the right lower quadrant. IMPRESSION: Small amount of ascites. Electronically Signed   By: Marlaine Hind M.D.   On: 11/05/2018 14:45   Dg Chest Port 1 View  Result Date: 11/05/2018 CLINICAL DATA:  Altered mental status. EXAM: PORTABLE CHEST 1 VIEW COMPARISON:  Chest x-ray dated 05/12/2018 and chest CT dated 05/31/2018 FINDINGS: The heart size and pulmonary vascularity are normal.  Increased density at the left lung base medially may represent an infiltrate. Faint density overlying the posterior aspect of the right fourth rib is consistent with an old healed fracture as visible on the prior chest CT dated 05/31/2018. No effusions.  No acute bone abnormality. IMPRESSION: Increased density at the left base medially which may represent an early infiltrate. Electronically Signed   By: Lorriane Shire M.D.   On: 11/05/2018 13:01   Dg Intro Long Gi Tube  Result Date: 11/07/2018 CLINICAL DATA:  Hepatic encephalopathy, need for medication administration and feeding. EXAM: INTRO LONG GI TUBE FLUOROSCOPY TIME:  Fluoroscopy Time:  1 minutes, 52 seconds Radiation Exposure Index (if provided by the fluoroscopic device): 28.9 mGy Number of Acquired Spot Images: 0 COMPARISON:  None. FINDINGS: With the patient gently held due to combativeness, and after use of viscous lidocaine on the right nostril, a feeding tube was advanced through the right nostril into the stomach under fluoroscopic observation. A variety of attempts and maneuvers were made, including use of a Amplatz super stiff Glidewire, to advance the tube past the pylorus, without further success. After attempts for further advancement were made, the tube was taped in place with the tip in the vicinity of the stomach antrum. IMPRESSION: 1. Fluoroscopically guided placement of a feeding tube. The tip of the feeding tube is in the stomach antrum. Electronically Signed   By: Van Clines M.D.   On: 11/07/2018 16:33      Discharge Exam: Vitals:   11/10/18 0530 11/10/18 2047  BP: (!) 183/99   Pulse: (!) 107 71  Resp: 16 18  Temp: 97.9 F (36.6  C) 98 F (36.7 C)  SpO2: 100% 96%    General: Pt is somnolent, comfortable  Cardiovascular: RRR, S1/S2 + Respiratory: No acute distress  Abdominal: Soft, NT, ND Extremities: no edema, no cyanosis    The results of significant diagnostics from this hospitalization (including imaging,  microbiology, ancillary and laboratory) are listed below for reference.     Microbiology: Recent Results (from the past 240 hour(s))  SARS Coronavirus 2 (CEPHEID - Performed in [facility] hospital lab), Hosp Order     Status: None   Collection Time: 11/04/18 11:05 PM   Specimen: Nasopharyngeal Swab  Result Value Ref Range Status   SARS Coronavirus 2 NEGATIVE NEGATIVE Final    Comment: (NOTE) If result is NEGATIVE SARS-CoV-2 target nucleic acids are NOT DETECTED. The SARS-CoV-2 RNA is generally detectable in upper and lower  respiratory specimens during the acute phase of infection. The lowest  concentration of SARS-CoV-2 viral copies this assay can detect is 250  copies / mL. A negative result does not preclude SARS-CoV-2 infection  and should not be used as the sole basis for treatment or other  patient management decisions.  A negative result may occur with  improper specimen collection / handling, submission of specimen other  than nasopharyngeal swab, presence of viral mutation(s) within the  areas targeted by this assay, and inadequate number of viral copies  (<250 copies / mL). A negative result must be combined with clinical  observations, patient history, and epidemiological information. If result is POSITIVE SARS-CoV-2 target nucleic acids are DETECTED. The SARS-CoV-2 RNA is generally detectable in upper and lower  respiratory specimens dur ing the acute phase of infection.  Positive  results are indicative of active infection with SARS-CoV-2.  Clinical  correlation with patient history and other diagnostic information is  necessary to determine patient infection status.  Positive results do  not rule out bacterial infection or co-infection with other viruses. If result is PRESUMPTIVE POSTIVE SARS-CoV-2 nucleic acids MAY BE PRESENT.   A presumptive positive result was obtained on the submitted specimen  and confirmed on repeat testing.  While 2019 novel coronavirus   (SARS-CoV-2) nucleic acids may be present in the submitted sample  additional confirmatory testing may be necessary for epidemiological  and / or clinical management purposes  to differentiate between  SARS-CoV-2 and other Sarbecovirus currently known to infect humans.  If clinically indicated additional testing with an alternate test  methodology [phone]) is advised. The SARS-CoV-2 RNA is generally  detectable in upper and lower respiratory sp ecimens during the acute  phase of infection. The expected result is Negative. Fact Sheet for Patients:  StrictlyIdeas.no Fact Sheet for Healthcare Providers: BankingDealers.co.za This test is not yet approved or cleared by the [place] FDA and has been authorized for detection and/or diagnosis of SARS-CoV-2 by FDA under an Emergency Use Authorization (EUA).  This EUA will remain in effect (meaning this test can be used) for the duration of the COVID-19 declaration under Section 564(b)(1) of the Act, 21 U.S.C. section 360bbb-3(b)(1), unless the authorization is terminated or revoked sooner. Performed at [facility], [place] [address], [place], [place] [address]   MRSA PCR Screening     Status: None   Collection Time: 11/05/18 12:04 AM   Specimen: Nasal Mucosa; Nasopharyngeal  Result Value Ref Range Status   MRSA by PCR NEGATIVE NEGATIVE Final    Comment:        The GeneXpert MRSA Assay (FDA approved for NASAL specimens only),  is one component of a comprehensive MRSA colonization surveillance program. It is not intended to diagnose MRSA infection nor to guide or monitor treatment for MRSA infections. Performed at [facility], [place] [address], [place], [place] [address]   Culture, blood (routine x 2)     Status: None   Collection Time: 11/05/18 12:31 PM   Specimen: BLOOD  Result Value Ref Range Status   Specimen Description BLOOD LEFT ANTECUBITAL   Final   Special Requests   Final    BOTTLES DRAWN AEROBIC ONLY Blood Culture adequate volume   Culture   Final    NO GROWTH 5 DAYS Performed at [facility], [place] [address], [place], [place] [address]    Report Status 11/10/2018 FINAL  Final  Culture, blood (routine x 2)     Status: None   Collection Time: 11/05/18 12:40 PM   Specimen: BLOOD  Result Value Ref Range Status   Specimen Description   Final    BLOOD RIGHT ARM Performed at [place] [address], [place], [place] [address]    Special Requests   Final    BOTTLES DRAWN AEROBIC AND ANAEROBIC Blood Culture adequate volume Performed at [place] [address], [place], [place] [address]    Culture   Final    NO GROWTH 5 DAYS Performed at [facility], [place] [address], [place], [place] [address]    Report Status 11/10/2018 FINAL  Final     Labs: BNP (last 3 results) No results for input(s): BNP in the last 8760 hours. Basic Metabolic Panel: Recent Labs  Lab 11/04/18 1845 11/05/18 0621 11/07/18 0559 11/08/18 0541 11/09/18 0644  NA 127* 132* 135 135 136  K 4.4 4.1 4.1 4.0 3.8  CL 88* 94* 98 100 103  CO2 33* _0 GLUCOSE 533* 299* 125* 191* 115*  BUN 25* _1 CREATININE 0.95 0.65 0.68 0.72 0.63  CALCIUM 7.8* 7.7* 8.0* 7.6* 7.8*   Liver Function Tests: Recent Labs  Lab 11/04/18 1845 11/05/18 0621  AST 31 33  ALT 14 15  ALKPHOS 144* 134*  BILITOT 1.0 1.2  PROT 6.0* 5.9*  ALBUMIN 1.6* 1.6*   Recent Labs  Lab 11/04/18 1845  LIPASE 21   Recent Labs  Lab 11/05/18 0621 11/09/18 0644  AMMONIA 37* 28   CBC: Recent Labs  Lab 11/04/18 1845 11/05/18 1035 11/06/18 0818 11/07/18 0559 11/08/18 0541 11/09/18 0644  WBC 10.8* 12.3* 8.3 12.6* 12.6* 14.0*  NEUTROABS 7.6  --  5.1 8.0* 9.4*  --   HGB 6.4* 8.0* 8.1* 9.6* 8.5* 8.6*  HCT 19.8* 24.5* 25.7* 28.6* 25.6* 27.0*  MCV 95.7 95.7 100.0 93.5 97.3 97.8  PLT 199 226 PLATELET  CLUMPS NOTED ON SMEAR, UNABLE TO ESTIMATE 211 218 206   Cardiac Enzymes: No results for input(s): CKTOTAL, CKMB, CKMBINDEX, TROPONINI in the last 168 hours. BNP: Invalid input(s): POCBNP CBG: Recent Labs  Lab 11/08/18 2336 11/09/18 0400 11/09/18 0730 11/09/18 1125 11/09/18 1609  GLUCAP 117* 121* 113* 117* 114*   D-Dimer No results for input(s): DDIMER in the last 72 hours. Hgb A1c No results for input(s): HGBA1C in the last 72 hours. Lipid Profile No results for input(s): CHOL, HDL, LDLCALC, TRIG, CHOLHDL, LDLDIRECT in the last 72 hours. Thyroid function studies No results for input(s): TSH, T4TOTAL, T3FREE, THYROIDAB in the last 72 hours.  Invalid input(s): FREET3 Anemia work up No results  for input(s): VITAMINB12, FOLATE, FERRITIN, TIBC, IRON, RETICCTPCT in the last 72 hours. Urinalysis    Component Value Date/Time   COLORURINE YELLOW 11/04/2018 1845   APPEARANCEUR CLEAR 11/04/2018 1845   APPEARANCEUR Clear 10/13/2012 2130   LABSPEC 1.025 11/04/2018 1845   LABSPEC 1.002 10/13/2012 2130   PHURINE 6.0 11/04/2018 1845   GLUCOSEU 150 (A) 11/04/2018 1845   GLUCOSEU >=500 10/13/2012 2130   HGBUR MODERATE (A) 11/04/2018 1845   BILIRUBINUR NEGATIVE 11/04/2018 1845   BILIRUBINUR Negative 10/13/2012 2130   KETONESUR NEGATIVE 11/04/2018 1845   PROTEINUR 100 (A) 11/04/2018 1845   UROBILINOGEN 0.2 11/06/2014 1910   NITRITE NEGATIVE 11/04/2018 1845   LEUKOCYTESUR NEGATIVE 11/04/2018 1845   LEUKOCYTESUR Negative 10/13/2012 2130   Sepsis Labs Invalid input(s): PROCALCITONIN,  WBC,  LACTICIDVEN Microbiology Recent Results (from the past 240 hour(s))  SARS Coronavirus 2 (CEPHEID - Performed in [facility] hospital lab), Hosp Order     Status: None   Collection Time: 11/04/18 11:05 PM   Specimen: Nasopharyngeal Swab  Result Value Ref Range Status   SARS Coronavirus 2 NEGATIVE NEGATIVE Final    Comment: (NOTE) If result is NEGATIVE SARS-CoV-2 target nucleic acids are NOT  DETECTED. The SARS-CoV-2 RNA is generally detectable in upper and lower  respiratory specimens during the acute phase of infection. The lowest  concentration of SARS-CoV-2 viral copies this assay can detect is 250  copies / mL. A negative result does not preclude SARS-CoV-2 infection  and should not be used as the sole basis for treatment or other  patient management decisions.  A negative result may occur with  improper specimen collection / handling, submission of specimen other  than nasopharyngeal swab, presence of viral mutation(s) within the  areas targeted by this assay, and inadequate number of viral copies  (<250 copies / mL). A negative result must be combined with clinical  observations, patient history, and epidemiological information. If result is POSITIVE SARS-CoV-2 target nucleic acids are DETECTED. The SARS-CoV-2 RNA is generally detectable in upper and lower  respiratory specimens dur ing the acute phase of infection.  Positive  results are indicative of active infection with SARS-CoV-2.  Clinical  correlation with patient history and other diagnostic information is  necessary to determine patient infection status.  Positive results do  not rule out bacterial infection or co-infection with other viruses. If result is PRESUMPTIVE POSTIVE SARS-CoV-2 nucleic acids MAY BE PRESENT.   A presumptive positive result was obtained on the submitted specimen  and confirmed on repeat testing.  While 2019 novel coronavirus  (SARS-CoV-2) nucleic acids may be present in the submitted sample  additional confirmatory testing may be necessary for epidemiological  and / or clinical management purposes  to differentiate between  SARS-CoV-2 and other Sarbecovirus currently known to infect humans.  If clinically indicated additional testing with an alternate test  methodology [phone]) is advised. The SARS-CoV-2 RNA is generally  detectable in upper and lower respiratory sp ecimens during  the acute  phase of infection. The expected result is Negative. Fact Sheet for Patients:  StrictlyIdeas.no Fact Sheet for Healthcare Providers: BankingDealers.co.za This test is not yet approved or cleared by the [place] FDA and has been authorized for detection and/or diagnosis of SARS-CoV-2 by FDA under an Emergency Use Authorization (EUA).  This EUA will remain in effect (meaning this test can be used) for the duration of the COVID-19 declaration under Section 564(b)(1) of the Act, 21 U.S.C. section 360bbb-3(b)(1), unless the authorization is terminated or  revoked sooner. Performed at [facility], [place] [address] [place], [place] [address]   MRSA PCR Screening     Status: None   Collection Time: 11/05/18 12:04 AM   Specimen: Nasal Mucosa; Nasopharyngeal  Result Value Ref Range Status   MRSA by PCR NEGATIVE NEGATIVE Final    Comment:        The GeneXpert MRSA Assay (FDA approved for NASAL specimens only), is one component of a comprehensive MRSA colonization surveillance program. It is not intended to diagnose MRSA infection nor to guide or monitor treatment for MRSA infections. Performed at [facility], [place] [address], [place], [name] [address]   Culture, blood (routine x 2)     Status: None   Collection Time: 11/05/18 12:31 PM   Specimen: BLOOD  Result Value Ref Range Status   Specimen Description BLOOD LEFT ANTECUBITAL  Final   Special Requests   Final    BOTTLES DRAWN AEROBIC ONLY Blood Culture adequate volume   Culture   Final    NO GROWTH 5 DAYS Performed at [facility], [place] [address] [place], [place] [address]    Report Status 11/10/2018 FINAL  Final  Culture, blood (routine x 2)     Status: None   Collection Time: 11/05/18 12:40 PM   Specimen: BLOOD  Result Value Ref Range Status   Specimen Description   Final    BLOOD RIGHT ARM Performed at [place] [address] [place], [place] [address]    Special Requests   Final    BOTTLES DRAWN AEROBIC AND ANAEROBIC Blood Culture adequate volume Performed at [place] [address] [place], [place] [address]    Culture   Final    NO GROWTH 5 DAYS Performed at [facility], [place] [address], [place], [place] [address]    Report Status 11/10/2018 FINAL  Final     Patient was seen and examined on the day of discharge and was found to be in stable condition. Time coordinating discharge: 25 minutes including assessment and coordination of care, as well as examination of the patient.   SIGNED:  Dessa Phi, DO [facility] [email or web address] 11/11/2018, 9:01 AM

## 2018-11-11 NOTE — Progress Notes (Signed)
Patient discharged to [facility] via [place], report called to [facility].

## 2018-11-30 NOTE — Progress Notes (Signed)
Reviewed and agree with documentation and assessment and plan. Damaris Hippo , MD

## 2018-12-09 DEATH — deceased

## 2019-05-01 IMAGING — US US ABDOMEN COMPLETE W/ ELASTOGRAPHY
1 series · 12 of 25 positions shown · non-contrast
Comparison: None.

CLINICAL DATA: Hepatitis-C



[Series 1: us abdomen complete w/ elastography · 0.19mm/px · 12 of 105 slices shown]
[im 5/105]
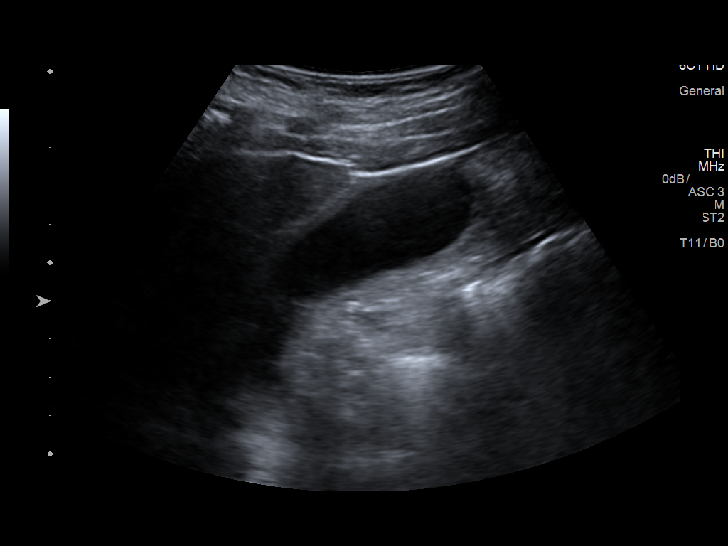
[im 14/105]
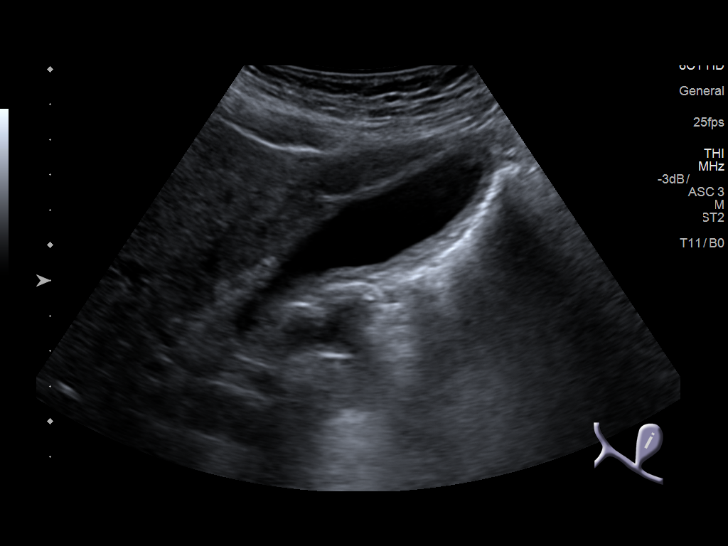
[im 22/105]
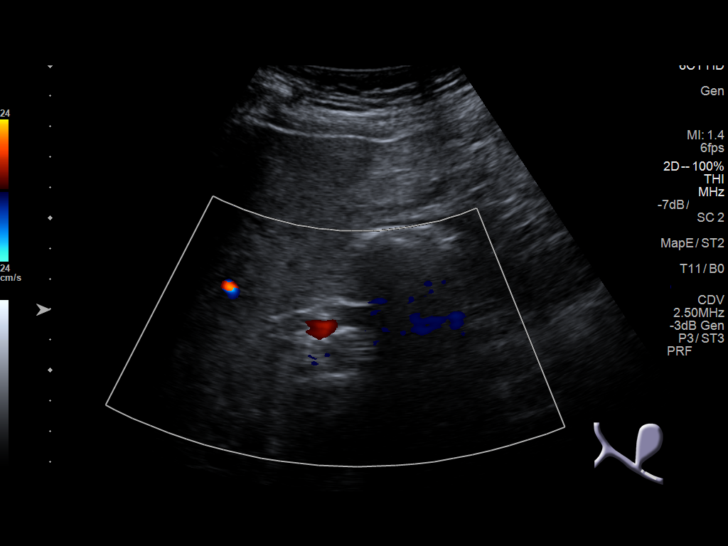
[im 31/105]
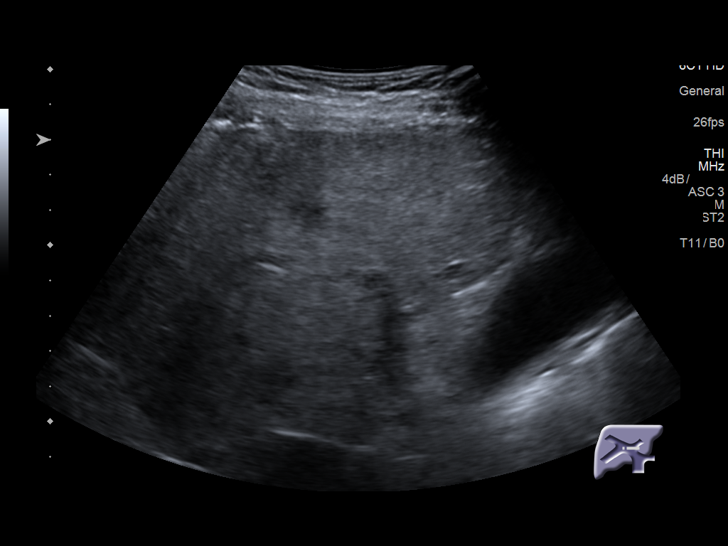
[im 40/105]
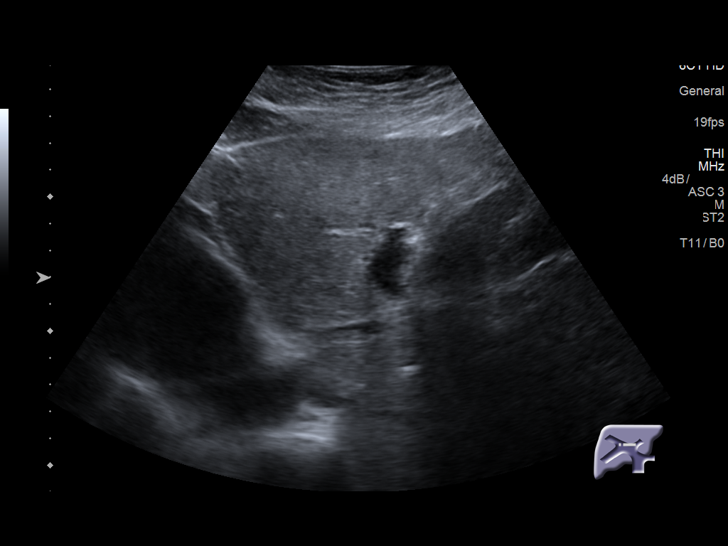
[im 48/105]
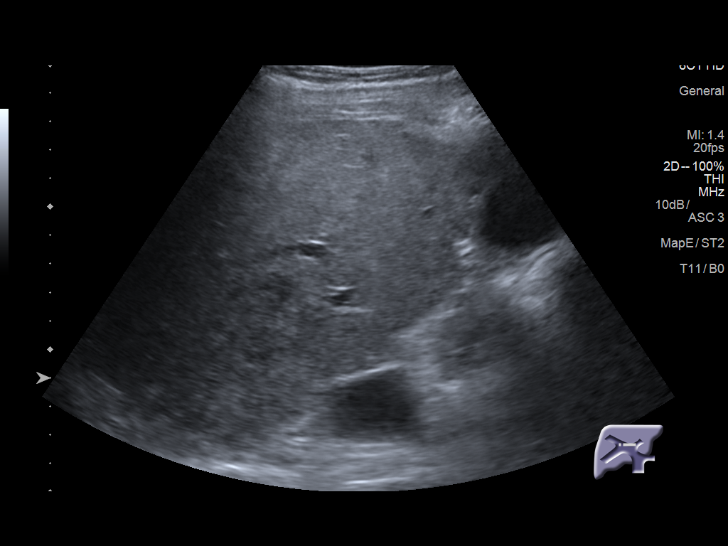
[im 57/105]
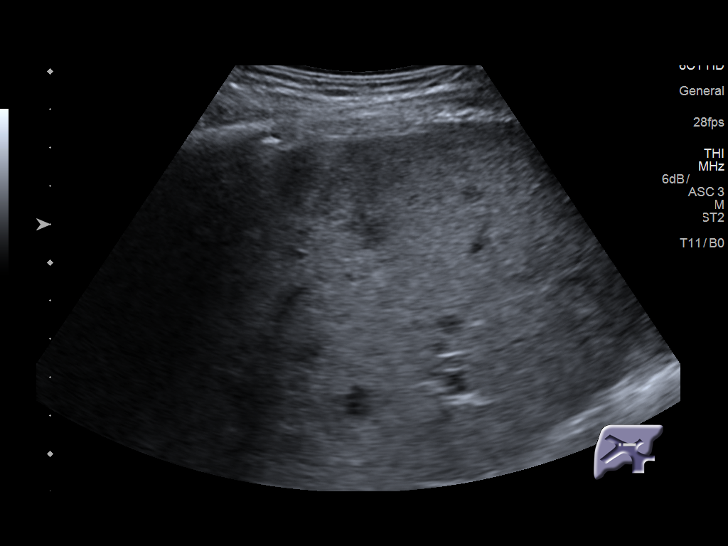
[im 66/105]
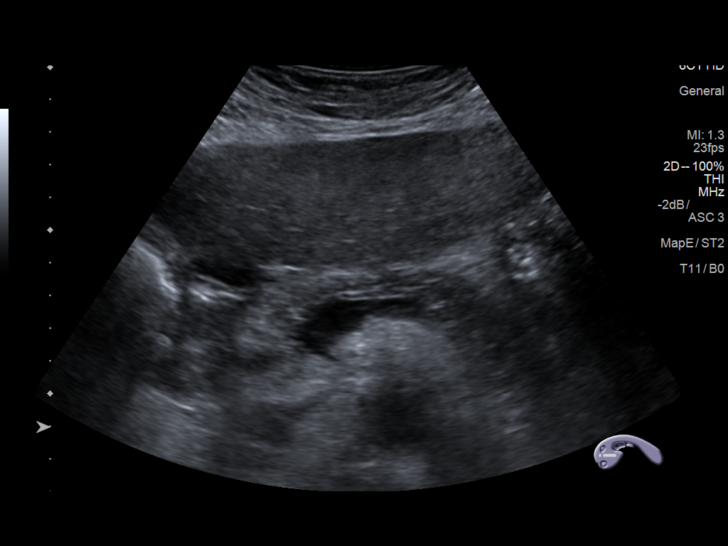
[im 74/105]
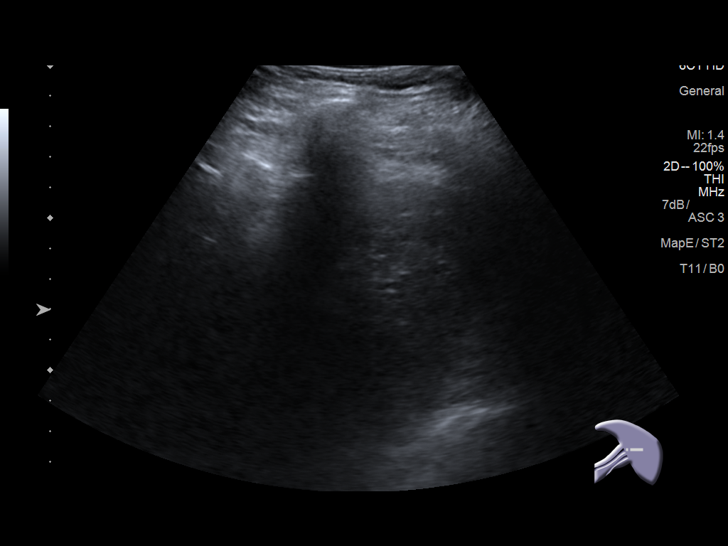
[im 83/105]
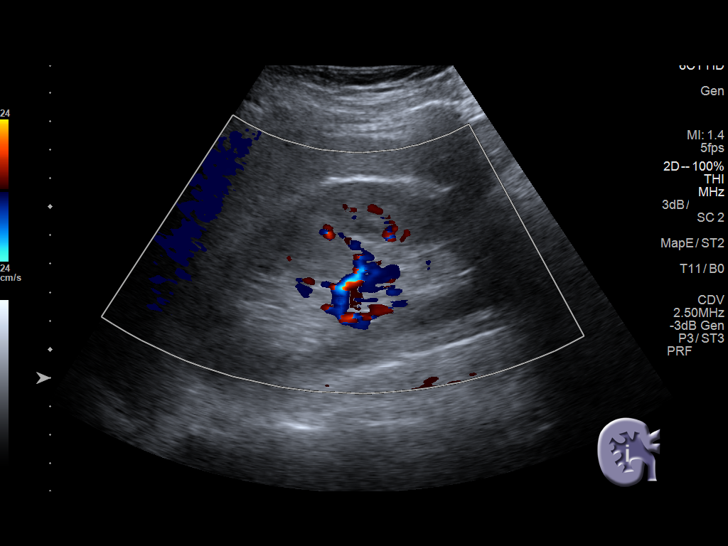
[im 92/105]
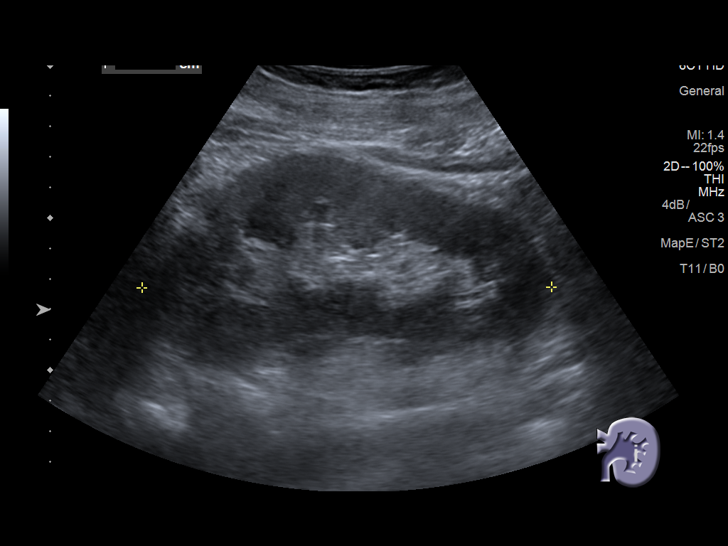
[im 100/105]
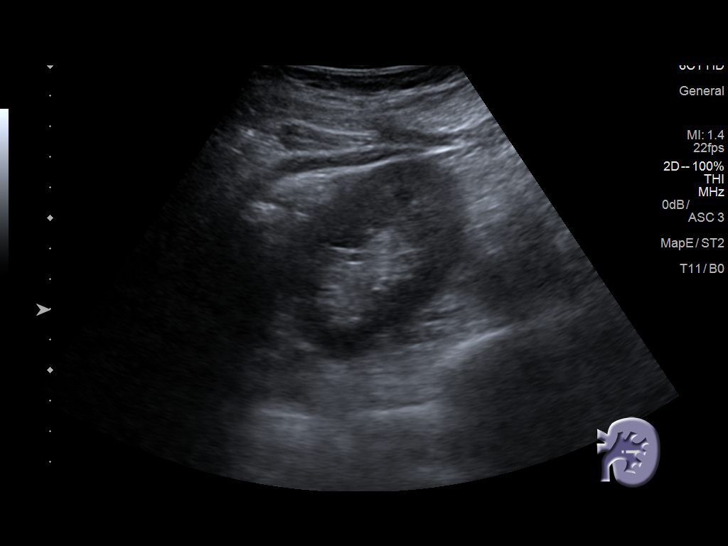

[12 of 25 positions shown; findings below may reference images not displayed]

FINDINGS: ULTRASOUND ABDOMEN

Gallbladder: No gallstones or wall thickening visualized. No
sonographic Murphy sign noted by sonographer.

Common bile duct: Diameter: Normal caliber, 6-7 mm

Liver: Heterogeneous increased echotexture throughout the liver.
There are areas of decreased echotexture which are felt to represent
areas of focal fatty sparing although focal hypoechoic lesions are
difficult to exclude. Largest such area measures 2.2 cm centrally.
Portal vein is patent on color Doppler imaging with normal direction
of blood flow towards the liver.

IVC: No abnormality visualized.

Pancreas: Visualized portion unremarkable.

Spleen: Size and appearance within normal limits.

Right Kidney: Length: 13.0 cm. Echogenicity within normal limits. No
mass or hydronephrosis visualized.

Left Kidney: Length: 13.4 cm. Echogenicity within normal limits. No
mass or hydronephrosis visualized.

Abdominal aorta: No aneurysm visualized.

Other findings: None.

ULTRASOUND HEPATIC ELASTOGRAPHY

Device: Siemens Helix VTQ

Patient position: Left lateral decubitus

Transducer [id number]

Number of measurements: 10

Hepatic segment:  8

Median velocity:   3.12 m/sec

IQR:

IQR/Median velocity ratio:

Corresponding Metavir fibrosis score:  Some F3 + F4

Risk of fibrosis: High

Limitations of exam: None

Please note that abnormal shear wave velocities may also be
identified in clinical settings other than with hepatic fibrosis,
such as: acute hepatitis, elevated right heart and central venous
pressures including use of beta blockers, Sniperizzo disease
(Yn), infiltrative processes such as
mastocytosis/amyloidosis/infiltrative tumor, extrahepatic
cholestasis, in the post-prandial state, and liver transplantation.
Correlation with patient history, laboratory data, and clinical
condition recommended.
IMPRESSION: ULTRASOUND ABDOMEN: Diffuse fatty infiltration of the liver.
Scattered hypoechoic areas could reflect focal fatty sparing
although focal hypoechoic lesions are difficult to exclude,
particularly a 2.2 cm hypoechoic area centrally. Consider further
evaluation with CT or MRI using liver protocol or attention on
follow-up ultrasound imaging.

ULTRASOUND HEPATIC ELASTOGRAPHY:

Median hepatic shear wave velocity is calculated at 3.12 m/sec.

Corresponding Metavir fibrosis score is Some F3 + F4.

Risk of fibrosis is High.

Follow-up: Follow up advised

## 2019-08-08 ENCOUNTER — Encounter: Payer: Self-pay | Admitting: *Deleted

## 2020-02-16 IMAGING — CT CT GUIDED ABLATION
2 of 18 series · 4 of 40 positions shown, 5 images · non-contrast
Comparison: CT abdomen/pelvis 08/04/2018;

INDICATION: 62-year-old male with alcoholic and HCV cirrhosis complicated by
multifocal hepatocellular carcinoma. Discrete lesions are identified
in the posteromedial aspect of hepatic segment 6 as well as
subcapsular in hepatic segment 8. He presents today for percutaneous
thermal ablation.

EXAM:
Microwave ablation of liver lesion using ultrasound and CT guidance
TECHNIQUE: Informed written consent was obtained from the patient after a
thorough discussion of the procedural risks, benefits and
alternatives. All questions were addressed. Maximal Sterile Barrier
Technique was utilized including caps, mask, sterile gowns, sterile
gloves, sterile drape, hand hygiene and skin antiseptic. A timeout
was performed prior to the initiation of the procedure.

[Series 3: i-spiral 2.0 b31f · axial · 0.69mm/px · z∈[+1257,+1269]mm · 2 of 144 slices shown, 3 images (1 of 2)]
[im 66/144  mediastinal]
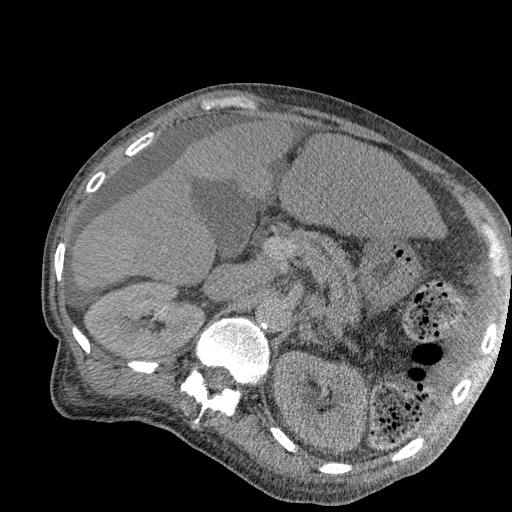
[im 66/144  lung]
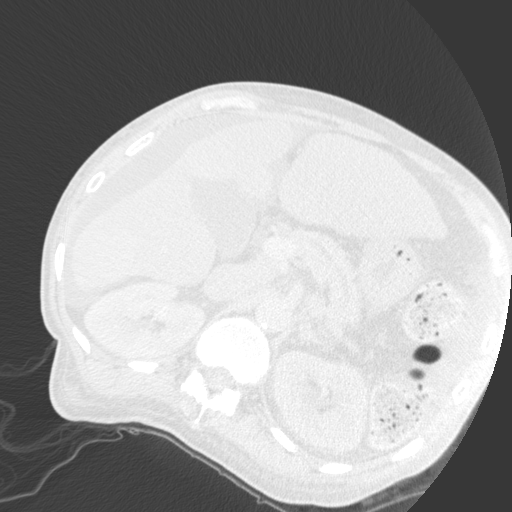
[im 72/144  lung]
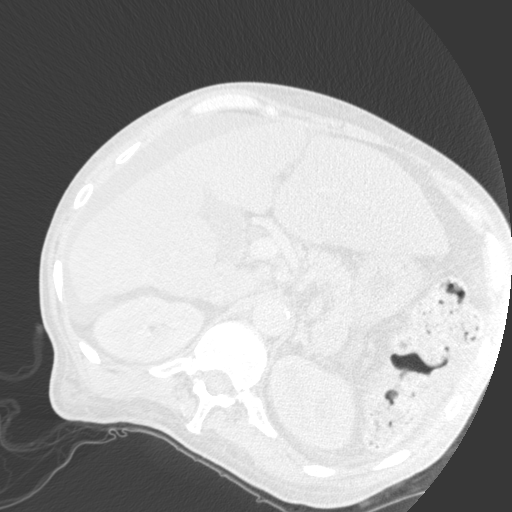

[Series 4: i-spiral 2.0 b31f · axial · 0.69mm/px · z∈[+1257,+1269]mm · 2 of 144 slices shown (2 of 2)]
[im 66/144  lung]
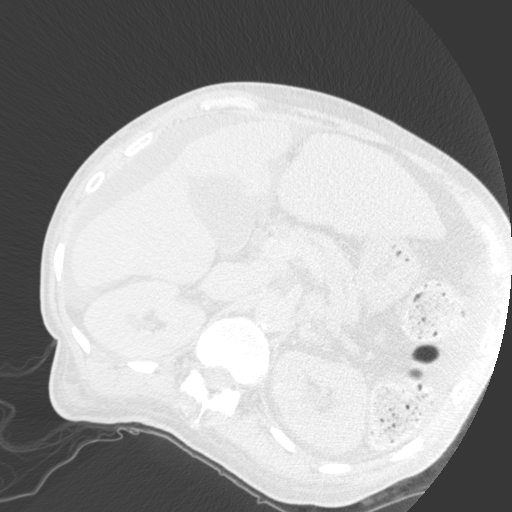
[im 72/144  lung]
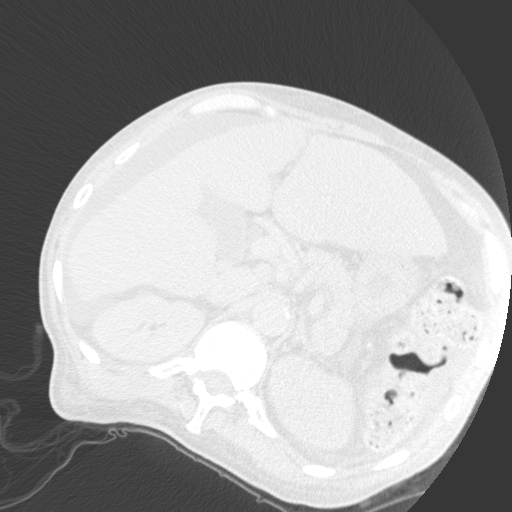

[4 of 40 positions shown; findings below may reference images not displayed]

MRI abdomen 05/31/2018

MEDICATIONS:
3.375 g Zosyn; The antibiotic was administered in an appropriate
time interval prior to needle puncture of the skin.

ANESTHESIA/SEDATION:
General - as administered by the Anesthesia department

FLUOROSCOPY TIME:  None.

COMPLICATIONS:
None immediate.
A planning axial CT scan of the liver was performed. Images were
obtained without contrast and with contrast in the arterial and
portal venous phases. The lesions were successfully identified. The
subcapsular segment 8 lesion measures approximately 2.7 x 2.7 cm.
The more ill-defined lesion in the posteromedial aspect of hepatic
segment six is much less well-defined and measures approximately
x 3.0 cm.

Ultrasound was then used to interrogate the abdomen. The subcapsular
lesion in hepatic segment 8 is easily visualized. Mild perihepatic
ascites. The lesion in the posteromedial aspect of segment 6 was
difficult to visualize.

Attention was first turned to the subcapsular segment 8 lesion.
Utilizing real-time ultrasound guidance, two 15 cm PR XT antennae
were carefully advanced through the margin of the liver and used to
bracket the lesion in a wedge-shaped configuration. Probe placement
was confirmed by CT imaging. Care was taken to avoid the inferior
aspect of the right lung. Ablation was then performed. Both antennae
were powered at 65 Racers and the ablation was maintained for 8
minutes. The probes were then removed.

Attention was turned to the lesion in the posterior and medial
aspect of segment 6. Utilizing CT imaging, a total of three 15 cm PR
XT antennae were advanced into the mass. Ablation was then performed
with all 3 antennae powered at 65 Racers. The ablation was maintained
for 10 minutes.

Tract ablation was performed while removing the probes. Repeat CT
imaging of the abdomen was then performed with intravenous contrast.
The ablation zones appear to adequately cover the lesion sites. No
evidence of active hemorrhage or other complication.
FINDINGS: Cirrhotic liver with mild perihepatic ascites.
IMPRESSION: Technically successful microwave ablation of hepatocellular
carcinoma in hepatic segments 8 and 6.

PLAN:
Follow-up MR imaging of the abdomen in 3 months accompanied by
clinic visit with Dr. Lehmann.

## 2020-04-16 IMAGING — DX PORTABLE CHEST - 1 VIEW
1 series · 1 of 1 positions shown · non-contrast
Comparison: Chest x-ray dated 05/12/2018 and chest CT dated
05/31/2018

CLINICAL DATA: Altered mental status.

EXAM:
PORTABLE CHEST 1 VIEW

[chest ap]
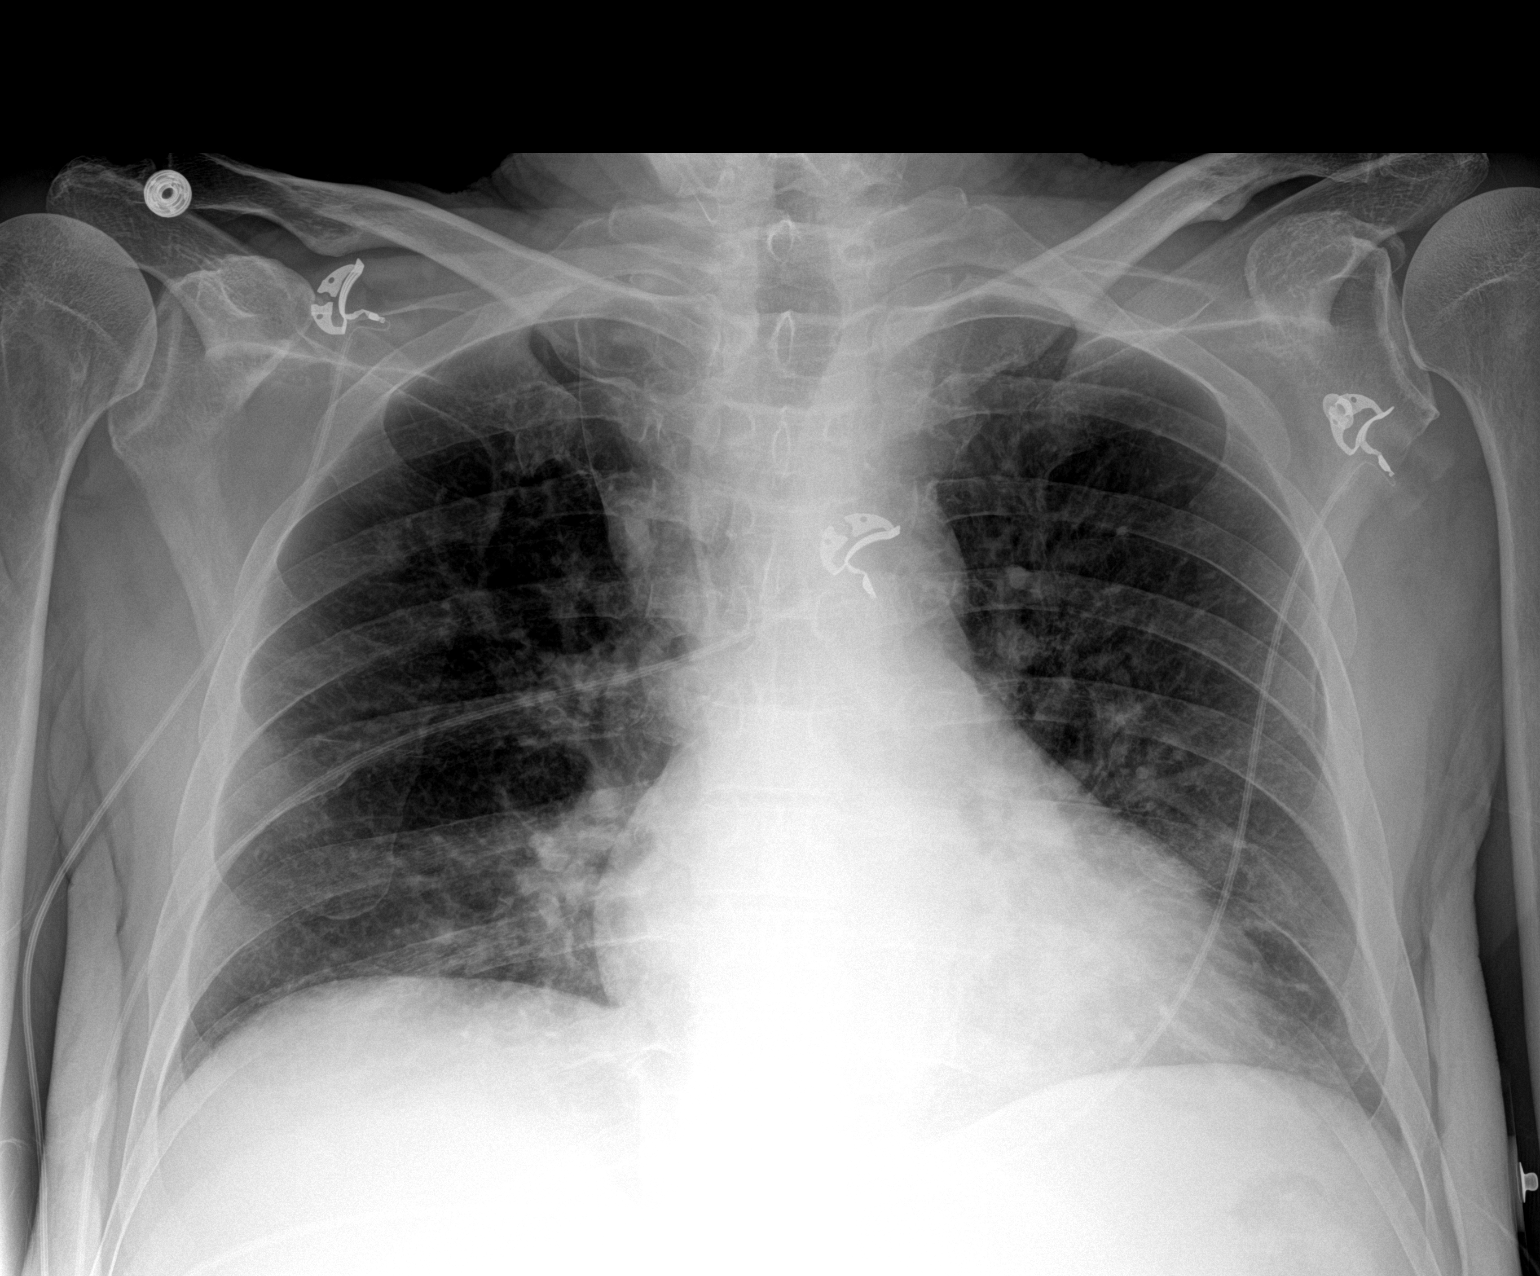

[1 of 1 positions shown; findings below may reference images not displayed]

FINDINGS: The heart size and pulmonary vascularity are normal. Increased
density at the left lung base medially may represent an infiltrate.
Faint density overlying the posterior aspect of the right fourth rib
is consistent with an old healed fracture as visible on the prior
chest CT dated 05/31/2018.

No effusions.  No acute bone abnormality.
IMPRESSION: Increased density at the left base medially which may represent an
early infiltrate.

## 2020-04-18 IMAGING — RF INTRO LONG GI TUBE
1 series · 1 of 1 positions shown · non-contrast
Comparison: None.

CLINICAL DATA: Hepatic encephalopathy, need for medication
administration and feeding.

EXAM:
INTRO LONG GI TUBE
FLUOROSCOPY TIME:  Fluoroscopy Time:  1 minutes, 52 seconds
Radiation Exposure Index (if provided by the fluoroscopic device):
28.9 mGy
Number of Acquired Spot Images: 0

[Series 1: cp_standard · 0.26mm/px · 1 of 1 slices shown]
[im 1/1]
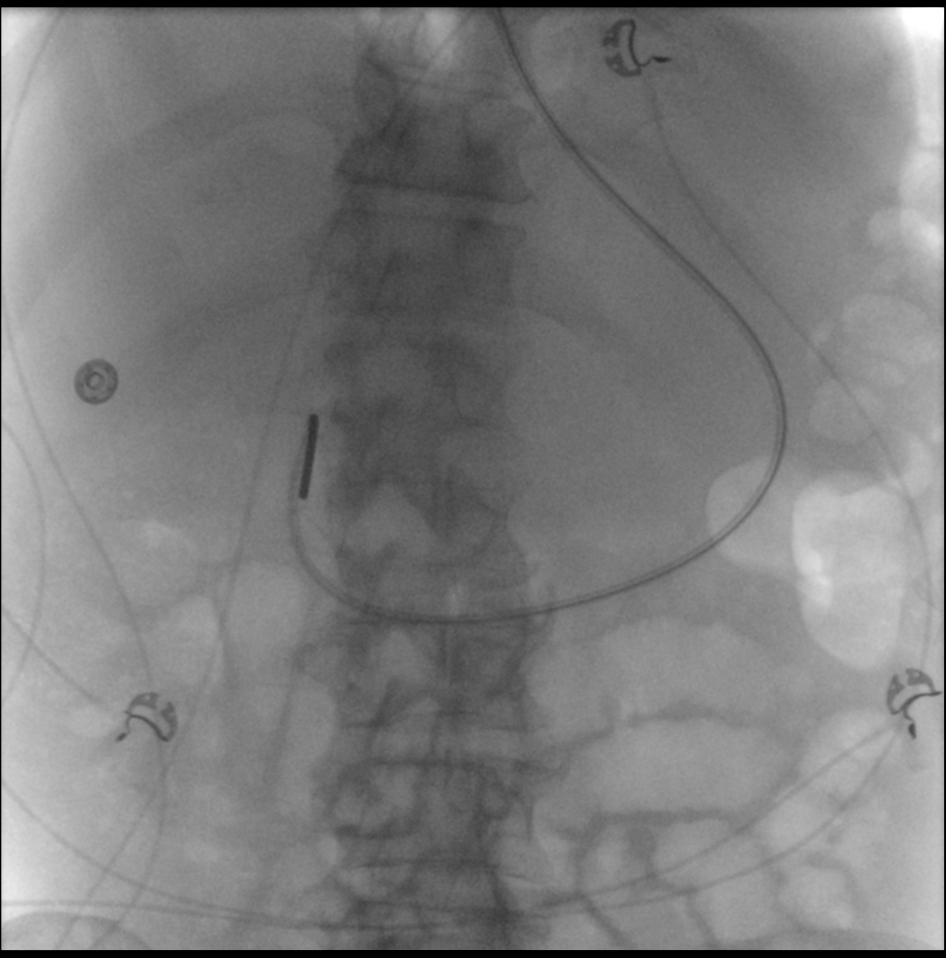

[1 of 1 positions shown; findings below may reference images not displayed]

FINDINGS: With the patient gently held due to combativeness, and after use of
viscous lidocaine on the right nostril, a feeding tube was advanced
through the right nostril into the stomach under fluoroscopic
observation. A variety of attempts and maneuvers were made,
including use of a Amplatz super stiff Glidewire, to advance the
tube past the pylorus, without further success. After attempts for
further advancement were made, the tube was taped in place with the
tip in the vicinity of the stomach antrum.
IMPRESSION: 1. Fluoroscopically guided placement of a feeding tube. The tip of
the feeding tube is in the stomach antrum.
